# Patient Record
Sex: Male | Born: 1940 | Race: White | Hispanic: No | Marital: Married | Smoking: Never smoker
Health system: Southern US, Community
[De-identification: ages and names within clinical notes are randomized; demographics above are authoritative.]

## PROBLEM LIST (undated history)

## (undated) DIAGNOSIS — N189 Chronic kidney disease, unspecified: Secondary | ICD-10-CM

## (undated) DIAGNOSIS — C4491 Basal cell carcinoma of skin, unspecified: Secondary | ICD-10-CM

## (undated) DIAGNOSIS — K4090 Unilateral inguinal hernia, without obstruction or gangrene, not specified as recurrent: Secondary | ICD-10-CM

## (undated) DIAGNOSIS — H269 Unspecified cataract: Secondary | ICD-10-CM

## (undated) DIAGNOSIS — I209 Angina pectoris, unspecified: Secondary | ICD-10-CM

## (undated) DIAGNOSIS — E119 Type 2 diabetes mellitus without complications: Secondary | ICD-10-CM

## (undated) DIAGNOSIS — Z7901 Long term (current) use of anticoagulants: Secondary | ICD-10-CM

## (undated) DIAGNOSIS — I219 Acute myocardial infarction, unspecified: Secondary | ICD-10-CM

## (undated) DIAGNOSIS — I700 Atherosclerosis of aorta: Secondary | ICD-10-CM

## (undated) DIAGNOSIS — E785 Hyperlipidemia, unspecified: Secondary | ICD-10-CM

## (undated) DIAGNOSIS — K5790 Diverticulosis of intestine, part unspecified, without perforation or abscess without bleeding: Secondary | ICD-10-CM

## (undated) DIAGNOSIS — K219 Gastro-esophageal reflux disease without esophagitis: Secondary | ICD-10-CM

## (undated) HISTORY — PX: CATARACT EXTRACTION W/ INTRAOCULAR LENS IMPLANT: SHX1309

## (undated) HISTORY — PX: TONSILLECTOMY AND ADENOIDECTOMY: SUR1326

## (undated) HISTORY — PX: CORONARY ANGIOPLASTY WITH STENT PLACEMENT: SHX49

## (undated) HISTORY — PX: SINUSOTOMY: SHX291

## (undated) HISTORY — PX: CARDIAC CATHETERIZATION: SHX172

---

## 2000-03-03 HISTORY — PX: ESOPHAGOGASTRODUODENOSCOPY: SHX1529

## 2001-01-31 HISTORY — PX: COLONOSCOPY: SHX174

## 2008-04-15 ENCOUNTER — Ambulatory Visit: Payer: Self-pay

## 2008-09-29 ENCOUNTER — Inpatient Hospital Stay: Payer: Self-pay | Admitting: Internal Medicine

## 2008-10-31 ENCOUNTER — Ambulatory Visit: Payer: Self-pay | Admitting: Internal Medicine

## 2008-11-02 ENCOUNTER — Ambulatory Visit: Payer: Self-pay | Admitting: Internal Medicine

## 2008-11-19 ENCOUNTER — Ambulatory Visit: Payer: Self-pay | Admitting: Unknown Physician Specialty

## 2008-11-19 HISTORY — PX: COLONOSCOPY: SHX174

## 2012-07-05 DIAGNOSIS — I219 Acute myocardial infarction, unspecified: Secondary | ICD-10-CM

## 2012-07-05 HISTORY — DX: Acute myocardial infarction, unspecified: I21.9

## 2013-05-12 ENCOUNTER — Emergency Department: Payer: Self-pay | Admitting: Emergency Medicine

## 2013-05-12 DIAGNOSIS — K219 Gastro-esophageal reflux disease without esophagitis: Secondary | ICD-10-CM | POA: Insufficient documentation

## 2013-05-12 DIAGNOSIS — I2510 Atherosclerotic heart disease of native coronary artery without angina pectoris: Secondary | ICD-10-CM

## 2013-05-12 DIAGNOSIS — I2119 ST elevation (STEMI) myocardial infarction involving other coronary artery of inferior wall: Secondary | ICD-10-CM

## 2013-05-12 HISTORY — DX: Atherosclerotic heart disease of native coronary artery without angina pectoris: I25.10

## 2013-05-12 HISTORY — PX: CORONARY ANGIOPLASTY WITH STENT PLACEMENT: SHX49

## 2013-05-12 HISTORY — DX: ST elevation (STEMI) myocardial infarction involving other coronary artery of inferior wall: I21.19

## 2013-05-12 LAB — COMPREHENSIVE METABOLIC PANEL
Albumin: 4 g/dL (ref 3.4–5.0)
Alkaline Phosphatase: 79 U/L (ref 50–136)
Anion Gap: 8 (ref 7–16)
BUN: 18 mg/dL (ref 7–18)
Calcium, Total: 8.8 mg/dL (ref 8.5–10.1)
Chloride: 105 mmol/L (ref 98–107)
Co2: 25 mmol/L (ref 21–32)
Creatinine: 1.08 mg/dL (ref 0.60–1.30)
EGFR (Non-African Amer.): >60
Osmolality: 283 (ref 275–301)

## 2013-05-12 LAB — CBC
HGB: 14.5 g/dL (ref 13.0–18.0)
MCH: 28.3 pg (ref 26.0–34.0)
MCHC: 33.6 g/dL (ref 32.0–36.0)
MCV: 84 fL (ref 80–100)
Platelet: 173 10*3/uL (ref 150–440)
RBC: 5.11 10*6/uL (ref 4.40–5.90)
RDW: 15.1 % — ABNORMAL HIGH (ref 11.5–14.5)

## 2013-05-12 LAB — TROPONIN I: Troponin-I: 0.04 ng/mL

## 2013-05-12 LAB — CK TOTAL AND CKMB (NOT AT ARMC): CK-MB: 2.4 ng/mL (ref 0.5–3.6)

## 2013-05-13 DIAGNOSIS — I4729 Other ventricular tachycardia: Secondary | ICD-10-CM

## 2013-05-13 HISTORY — DX: Other ventricular tachycardia: I47.29

## 2013-05-22 ENCOUNTER — Encounter: Payer: Self-pay | Admitting: Internal Medicine

## 2013-06-04 ENCOUNTER — Encounter: Payer: Self-pay | Admitting: Internal Medicine

## 2015-01-27 HISTORY — PX: MOHS SURGERY: SUR867

## 2015-06-20 ENCOUNTER — Other Ambulatory Visit: Payer: Self-pay | Admitting: Internal Medicine

## 2015-06-20 ENCOUNTER — Ambulatory Visit
Admission: RE | Admit: 2015-06-20 | Discharge: 2015-06-20 | Disposition: A | Discharge status: Home / Self Care | Payer: PPO | Source: Ambulatory Visit | Attending: Internal Medicine | Admitting: Internal Medicine

## 2015-06-20 DIAGNOSIS — R1031 Right lower quadrant pain: Secondary | ICD-10-CM

## 2015-06-20 DIAGNOSIS — K4020 Bilateral inguinal hernia, without obstruction or gangrene, not specified as recurrent: Secondary | ICD-10-CM | POA: Diagnosis not present

## 2015-06-20 DIAGNOSIS — K5730 Diverticulosis of large intestine without perforation or abscess without bleeding: Secondary | ICD-10-CM | POA: Diagnosis not present

## 2015-06-20 DIAGNOSIS — K353 Acute appendicitis with localized peritonitis, without perforation or gangrene: Secondary | ICD-10-CM

## 2015-06-20 MED ORDER — IOHEXOL 300 MG/ML  SOLN
100.0000 mL | Freq: Once | INTRAMUSCULAR | Status: AC | PRN
  Administered 2015-06-20: 100 mL via INTRAVENOUS

## 2015-07-01 ENCOUNTER — Ambulatory Visit: Payer: Self-pay

## 2015-08-14 DIAGNOSIS — X32XXXA Exposure to sunlight, initial encounter: Secondary | ICD-10-CM | POA: Diagnosis not present

## 2015-08-14 DIAGNOSIS — L570 Actinic keratosis: Secondary | ICD-10-CM | POA: Diagnosis not present

## 2015-08-14 DIAGNOSIS — D485 Neoplasm of uncertain behavior of skin: Secondary | ICD-10-CM | POA: Diagnosis not present

## 2015-08-14 DIAGNOSIS — D0471 Carcinoma in situ of skin of right lower limb, including hip: Secondary | ICD-10-CM | POA: Diagnosis not present

## 2015-08-27 DIAGNOSIS — Z961 Presence of intraocular lens: Secondary | ICD-10-CM | POA: Diagnosis not present

## 2015-09-11 DIAGNOSIS — D0471 Carcinoma in situ of skin of right lower limb, including hip: Secondary | ICD-10-CM | POA: Diagnosis not present

## 2015-10-21 DIAGNOSIS — Z125 Encounter for screening for malignant neoplasm of prostate: Secondary | ICD-10-CM | POA: Diagnosis not present

## 2015-10-21 DIAGNOSIS — E119 Type 2 diabetes mellitus without complications: Secondary | ICD-10-CM | POA: Diagnosis not present

## 2015-10-28 DIAGNOSIS — L570 Actinic keratosis: Secondary | ICD-10-CM | POA: Diagnosis not present

## 2015-10-28 DIAGNOSIS — E782 Mixed hyperlipidemia: Secondary | ICD-10-CM | POA: Insufficient documentation

## 2015-10-28 DIAGNOSIS — Z0000 Encounter for general adult medical examination without abnormal findings: Secondary | ICD-10-CM | POA: Diagnosis not present

## 2015-10-28 DIAGNOSIS — E119 Type 2 diabetes mellitus without complications: Secondary | ICD-10-CM | POA: Diagnosis not present

## 2015-12-11 DIAGNOSIS — D485 Neoplasm of uncertain behavior of skin: Secondary | ICD-10-CM | POA: Diagnosis not present

## 2015-12-11 DIAGNOSIS — L570 Actinic keratosis: Secondary | ICD-10-CM | POA: Diagnosis not present

## 2015-12-11 DIAGNOSIS — C44619 Basal cell carcinoma of skin of left upper limb, including shoulder: Secondary | ICD-10-CM | POA: Diagnosis not present

## 2015-12-11 DIAGNOSIS — X32XXXA Exposure to sunlight, initial encounter: Secondary | ICD-10-CM | POA: Diagnosis not present

## 2015-12-11 DIAGNOSIS — C44719 Basal cell carcinoma of skin of left lower limb, including hip: Secondary | ICD-10-CM | POA: Diagnosis not present

## 2015-12-11 DIAGNOSIS — Z85828 Personal history of other malignant neoplasm of skin: Secondary | ICD-10-CM | POA: Diagnosis not present

## 2015-12-17 DIAGNOSIS — L905 Scar conditions and fibrosis of skin: Secondary | ICD-10-CM | POA: Diagnosis not present

## 2015-12-17 DIAGNOSIS — C44619 Basal cell carcinoma of skin of left upper limb, including shoulder: Secondary | ICD-10-CM | POA: Diagnosis not present

## 2016-01-08 DIAGNOSIS — C44719 Basal cell carcinoma of skin of left lower limb, including hip: Secondary | ICD-10-CM | POA: Diagnosis not present

## 2016-02-10 DIAGNOSIS — H169 Unspecified keratitis: Secondary | ICD-10-CM | POA: Diagnosis not present

## 2016-02-12 DIAGNOSIS — H169 Unspecified keratitis: Secondary | ICD-10-CM | POA: Diagnosis not present

## 2016-02-16 DIAGNOSIS — E119 Type 2 diabetes mellitus without complications: Secondary | ICD-10-CM | POA: Diagnosis not present

## 2016-03-01 DIAGNOSIS — H169 Unspecified keratitis: Secondary | ICD-10-CM | POA: Diagnosis not present

## 2016-03-05 DIAGNOSIS — R197 Diarrhea, unspecified: Secondary | ICD-10-CM | POA: Diagnosis not present

## 2016-03-09 DIAGNOSIS — H1859 Other hereditary corneal dystrophies: Secondary | ICD-10-CM | POA: Diagnosis not present

## 2016-04-08 DIAGNOSIS — H1859 Other hereditary corneal dystrophies: Secondary | ICD-10-CM | POA: Diagnosis not present

## 2016-04-21 DIAGNOSIS — E119 Type 2 diabetes mellitus without complications: Secondary | ICD-10-CM | POA: Diagnosis not present

## 2016-04-21 DIAGNOSIS — Z0000 Encounter for general adult medical examination without abnormal findings: Secondary | ICD-10-CM | POA: Diagnosis not present

## 2016-04-28 DIAGNOSIS — Z23 Encounter for immunization: Secondary | ICD-10-CM | POA: Diagnosis not present

## 2016-04-28 DIAGNOSIS — E119 Type 2 diabetes mellitus without complications: Secondary | ICD-10-CM | POA: Diagnosis not present

## 2016-04-28 DIAGNOSIS — E782 Mixed hyperlipidemia: Secondary | ICD-10-CM | POA: Diagnosis not present

## 2016-05-12 DIAGNOSIS — D0462 Carcinoma in situ of skin of left upper limb, including shoulder: Secondary | ICD-10-CM | POA: Diagnosis not present

## 2016-05-12 DIAGNOSIS — D485 Neoplasm of uncertain behavior of skin: Secondary | ICD-10-CM | POA: Diagnosis not present

## 2016-05-12 DIAGNOSIS — C44612 Basal cell carcinoma of skin of right upper limb, including shoulder: Secondary | ICD-10-CM | POA: Diagnosis not present

## 2016-05-12 DIAGNOSIS — L400 Psoriasis vulgaris: Secondary | ICD-10-CM | POA: Diagnosis not present

## 2016-05-25 DIAGNOSIS — H6063 Unspecified chronic otitis externa, bilateral: Secondary | ICD-10-CM | POA: Diagnosis not present

## 2016-05-25 DIAGNOSIS — H6123 Impacted cerumen, bilateral: Secondary | ICD-10-CM | POA: Diagnosis not present

## 2016-06-03 DIAGNOSIS — D0462 Carcinoma in situ of skin of left upper limb, including shoulder: Secondary | ICD-10-CM | POA: Diagnosis not present

## 2016-06-03 DIAGNOSIS — C44519 Basal cell carcinoma of skin of other part of trunk: Secondary | ICD-10-CM | POA: Diagnosis not present

## 2016-08-02 DIAGNOSIS — J4522 Mild intermittent asthma with status asthmaticus: Secondary | ICD-10-CM | POA: Diagnosis not present

## 2016-08-02 DIAGNOSIS — J40 Bronchitis, not specified as acute or chronic: Secondary | ICD-10-CM | POA: Diagnosis not present

## 2016-09-01 DIAGNOSIS — G5702 Lesion of sciatic nerve, left lower limb: Secondary | ICD-10-CM | POA: Diagnosis not present

## 2016-09-09 DIAGNOSIS — M461 Sacroiliitis, not elsewhere classified: Secondary | ICD-10-CM | POA: Diagnosis not present

## 2016-09-09 DIAGNOSIS — M9903 Segmental and somatic dysfunction of lumbar region: Secondary | ICD-10-CM | POA: Diagnosis not present

## 2016-09-09 DIAGNOSIS — M9904 Segmental and somatic dysfunction of sacral region: Secondary | ICD-10-CM | POA: Diagnosis not present

## 2016-09-09 DIAGNOSIS — M545 Low back pain: Secondary | ICD-10-CM | POA: Diagnosis not present

## 2016-09-13 DIAGNOSIS — M461 Sacroiliitis, not elsewhere classified: Secondary | ICD-10-CM | POA: Diagnosis not present

## 2016-09-13 DIAGNOSIS — M545 Low back pain: Secondary | ICD-10-CM | POA: Diagnosis not present

## 2016-09-13 DIAGNOSIS — M9903 Segmental and somatic dysfunction of lumbar region: Secondary | ICD-10-CM | POA: Diagnosis not present

## 2016-09-13 DIAGNOSIS — M9904 Segmental and somatic dysfunction of sacral region: Secondary | ICD-10-CM | POA: Diagnosis not present

## 2016-09-27 DIAGNOSIS — M9904 Segmental and somatic dysfunction of sacral region: Secondary | ICD-10-CM | POA: Diagnosis not present

## 2016-09-27 DIAGNOSIS — M545 Low back pain: Secondary | ICD-10-CM | POA: Diagnosis not present

## 2016-09-27 DIAGNOSIS — M461 Sacroiliitis, not elsewhere classified: Secondary | ICD-10-CM | POA: Diagnosis not present

## 2016-09-27 DIAGNOSIS — M9903 Segmental and somatic dysfunction of lumbar region: Secondary | ICD-10-CM | POA: Diagnosis not present

## 2016-10-07 DIAGNOSIS — D485 Neoplasm of uncertain behavior of skin: Secondary | ICD-10-CM | POA: Diagnosis not present

## 2016-10-07 DIAGNOSIS — L821 Other seborrheic keratosis: Secondary | ICD-10-CM | POA: Diagnosis not present

## 2016-10-07 DIAGNOSIS — Z08 Encounter for follow-up examination after completed treatment for malignant neoplasm: Secondary | ICD-10-CM | POA: Diagnosis not present

## 2016-10-07 DIAGNOSIS — D0461 Carcinoma in situ of skin of right upper limb, including shoulder: Secondary | ICD-10-CM | POA: Diagnosis not present

## 2016-10-07 DIAGNOSIS — Z85828 Personal history of other malignant neoplasm of skin: Secondary | ICD-10-CM | POA: Diagnosis not present

## 2016-10-07 DIAGNOSIS — L570 Actinic keratosis: Secondary | ICD-10-CM | POA: Diagnosis not present

## 2016-10-21 DIAGNOSIS — Z125 Encounter for screening for malignant neoplasm of prostate: Secondary | ICD-10-CM | POA: Diagnosis not present

## 2016-10-21 DIAGNOSIS — E119 Type 2 diabetes mellitus without complications: Secondary | ICD-10-CM | POA: Diagnosis not present

## 2016-10-21 DIAGNOSIS — E782 Mixed hyperlipidemia: Secondary | ICD-10-CM | POA: Diagnosis not present

## 2016-10-26 DIAGNOSIS — M9903 Segmental and somatic dysfunction of lumbar region: Secondary | ICD-10-CM | POA: Diagnosis not present

## 2016-10-26 DIAGNOSIS — M9904 Segmental and somatic dysfunction of sacral region: Secondary | ICD-10-CM | POA: Diagnosis not present

## 2016-10-26 DIAGNOSIS — M545 Low back pain: Secondary | ICD-10-CM | POA: Diagnosis not present

## 2016-10-26 DIAGNOSIS — M461 Sacroiliitis, not elsewhere classified: Secondary | ICD-10-CM | POA: Diagnosis not present

## 2016-10-29 DIAGNOSIS — Z0000 Encounter for general adult medical examination without abnormal findings: Secondary | ICD-10-CM | POA: Diagnosis not present

## 2016-10-29 DIAGNOSIS — E119 Type 2 diabetes mellitus without complications: Secondary | ICD-10-CM | POA: Diagnosis not present

## 2016-11-19 DIAGNOSIS — I2510 Atherosclerotic heart disease of native coronary artery without angina pectoris: Secondary | ICD-10-CM | POA: Diagnosis not present

## 2016-11-19 DIAGNOSIS — R0789 Other chest pain: Secondary | ICD-10-CM | POA: Diagnosis not present

## 2016-11-19 DIAGNOSIS — E782 Mixed hyperlipidemia: Secondary | ICD-10-CM | POA: Diagnosis not present

## 2016-11-19 DIAGNOSIS — K219 Gastro-esophageal reflux disease without esophagitis: Secondary | ICD-10-CM | POA: Diagnosis not present

## 2016-11-19 DIAGNOSIS — E119 Type 2 diabetes mellitus without complications: Secondary | ICD-10-CM | POA: Diagnosis not present

## 2016-11-25 DIAGNOSIS — D0461 Carcinoma in situ of skin of right upper limb, including shoulder: Secondary | ICD-10-CM | POA: Diagnosis not present

## 2016-11-25 DIAGNOSIS — L570 Actinic keratosis: Secondary | ICD-10-CM | POA: Diagnosis not present

## 2016-12-03 DIAGNOSIS — I2510 Atherosclerotic heart disease of native coronary artery without angina pectoris: Secondary | ICD-10-CM | POA: Diagnosis not present

## 2016-12-03 DIAGNOSIS — R0789 Other chest pain: Secondary | ICD-10-CM | POA: Diagnosis not present

## 2016-12-10 DIAGNOSIS — E119 Type 2 diabetes mellitus without complications: Secondary | ICD-10-CM | POA: Diagnosis not present

## 2016-12-10 DIAGNOSIS — I2510 Atherosclerotic heart disease of native coronary artery without angina pectoris: Secondary | ICD-10-CM | POA: Diagnosis not present

## 2016-12-10 DIAGNOSIS — E782 Mixed hyperlipidemia: Secondary | ICD-10-CM | POA: Diagnosis not present

## 2017-01-18 DIAGNOSIS — H1859 Other hereditary corneal dystrophies: Secondary | ICD-10-CM | POA: Diagnosis not present

## 2017-01-27 DIAGNOSIS — L570 Actinic keratosis: Secondary | ICD-10-CM | POA: Diagnosis not present

## 2017-01-27 DIAGNOSIS — X32XXXA Exposure to sunlight, initial encounter: Secondary | ICD-10-CM | POA: Diagnosis not present

## 2017-04-01 DIAGNOSIS — X32XXXA Exposure to sunlight, initial encounter: Secondary | ICD-10-CM | POA: Diagnosis not present

## 2017-04-01 DIAGNOSIS — L570 Actinic keratosis: Secondary | ICD-10-CM | POA: Diagnosis not present

## 2017-04-25 DIAGNOSIS — E119 Type 2 diabetes mellitus without complications: Secondary | ICD-10-CM | POA: Diagnosis not present

## 2017-05-02 DIAGNOSIS — E119 Type 2 diabetes mellitus without complications: Secondary | ICD-10-CM | POA: Diagnosis not present

## 2017-05-02 DIAGNOSIS — Z23 Encounter for immunization: Secondary | ICD-10-CM | POA: Diagnosis not present

## 2017-05-02 DIAGNOSIS — Z79899 Other long term (current) drug therapy: Secondary | ICD-10-CM | POA: Diagnosis not present

## 2017-05-02 DIAGNOSIS — Z125 Encounter for screening for malignant neoplasm of prostate: Secondary | ICD-10-CM | POA: Diagnosis not present

## 2017-05-02 DIAGNOSIS — D500 Iron deficiency anemia secondary to blood loss (chronic): Secondary | ICD-10-CM | POA: Diagnosis not present

## 2017-05-02 DIAGNOSIS — E538 Deficiency of other specified B group vitamins: Secondary | ICD-10-CM | POA: Diagnosis not present

## 2017-05-02 DIAGNOSIS — E782 Mixed hyperlipidemia: Secondary | ICD-10-CM | POA: Diagnosis not present

## 2017-05-13 DIAGNOSIS — D500 Iron deficiency anemia secondary to blood loss (chronic): Secondary | ICD-10-CM | POA: Diagnosis not present

## 2017-05-18 DIAGNOSIS — E119 Type 2 diabetes mellitus without complications: Secondary | ICD-10-CM | POA: Diagnosis not present

## 2017-05-18 DIAGNOSIS — J40 Bronchitis, not specified as acute or chronic: Secondary | ICD-10-CM | POA: Diagnosis not present

## 2017-06-02 DIAGNOSIS — D500 Iron deficiency anemia secondary to blood loss (chronic): Secondary | ICD-10-CM | POA: Diagnosis not present

## 2017-06-22 DIAGNOSIS — L218 Other seborrheic dermatitis: Secondary | ICD-10-CM | POA: Diagnosis not present

## 2017-06-22 DIAGNOSIS — C44612 Basal cell carcinoma of skin of right upper limb, including shoulder: Secondary | ICD-10-CM | POA: Diagnosis not present

## 2017-06-22 DIAGNOSIS — D485 Neoplasm of uncertain behavior of skin: Secondary | ICD-10-CM | POA: Diagnosis not present

## 2017-07-28 DIAGNOSIS — H6061 Unspecified chronic otitis externa, right ear: Secondary | ICD-10-CM | POA: Diagnosis not present

## 2017-07-28 DIAGNOSIS — H60541 Acute eczematoid otitis externa, right ear: Secondary | ICD-10-CM | POA: Diagnosis not present

## 2017-08-05 DIAGNOSIS — D485 Neoplasm of uncertain behavior of skin: Secondary | ICD-10-CM | POA: Diagnosis not present

## 2017-08-05 DIAGNOSIS — C44612 Basal cell carcinoma of skin of right upper limb, including shoulder: Secondary | ICD-10-CM | POA: Diagnosis not present

## 2017-08-05 DIAGNOSIS — C44329 Squamous cell carcinoma of skin of other parts of face: Secondary | ICD-10-CM | POA: Diagnosis not present

## 2017-08-05 DIAGNOSIS — C4441 Basal cell carcinoma of skin of scalp and neck: Secondary | ICD-10-CM | POA: Diagnosis not present

## 2017-08-19 DIAGNOSIS — C44612 Basal cell carcinoma of skin of right upper limb, including shoulder: Secondary | ICD-10-CM | POA: Diagnosis not present

## 2017-09-15 DIAGNOSIS — L905 Scar conditions and fibrosis of skin: Secondary | ICD-10-CM | POA: Diagnosis not present

## 2017-09-15 DIAGNOSIS — C44319 Basal cell carcinoma of skin of other parts of face: Secondary | ICD-10-CM | POA: Diagnosis not present

## 2017-09-15 DIAGNOSIS — C44329 Squamous cell carcinoma of skin of other parts of face: Secondary | ICD-10-CM | POA: Diagnosis not present

## 2017-09-29 DIAGNOSIS — L538 Other specified erythematous conditions: Secondary | ICD-10-CM | POA: Diagnosis not present

## 2017-09-29 DIAGNOSIS — L239 Allergic contact dermatitis, unspecified cause: Secondary | ICD-10-CM | POA: Diagnosis not present

## 2017-09-29 DIAGNOSIS — L820 Inflamed seborrheic keratosis: Secondary | ICD-10-CM | POA: Diagnosis not present

## 2017-09-29 DIAGNOSIS — L308 Other specified dermatitis: Secondary | ICD-10-CM | POA: Diagnosis not present

## 2017-09-29 DIAGNOSIS — L821 Other seborrheic keratosis: Secondary | ICD-10-CM | POA: Diagnosis not present

## 2017-10-27 DIAGNOSIS — E782 Mixed hyperlipidemia: Secondary | ICD-10-CM | POA: Diagnosis not present

## 2017-10-27 DIAGNOSIS — Z125 Encounter for screening for malignant neoplasm of prostate: Secondary | ICD-10-CM | POA: Diagnosis not present

## 2017-10-27 DIAGNOSIS — E119 Type 2 diabetes mellitus without complications: Secondary | ICD-10-CM | POA: Diagnosis not present

## 2017-10-27 DIAGNOSIS — E538 Deficiency of other specified B group vitamins: Secondary | ICD-10-CM | POA: Diagnosis not present

## 2017-10-27 DIAGNOSIS — M545 Low back pain: Secondary | ICD-10-CM | POA: Diagnosis not present

## 2017-10-27 DIAGNOSIS — M9903 Segmental and somatic dysfunction of lumbar region: Secondary | ICD-10-CM | POA: Diagnosis not present

## 2017-10-31 DIAGNOSIS — M9903 Segmental and somatic dysfunction of lumbar region: Secondary | ICD-10-CM | POA: Diagnosis not present

## 2017-10-31 DIAGNOSIS — M545 Low back pain: Secondary | ICD-10-CM | POA: Diagnosis not present

## 2017-11-03 DIAGNOSIS — E119 Type 2 diabetes mellitus without complications: Secondary | ICD-10-CM | POA: Diagnosis not present

## 2017-11-03 DIAGNOSIS — E782 Mixed hyperlipidemia: Secondary | ICD-10-CM | POA: Diagnosis not present

## 2017-11-03 DIAGNOSIS — Z0000 Encounter for general adult medical examination without abnormal findings: Secondary | ICD-10-CM | POA: Diagnosis not present

## 2017-12-01 DIAGNOSIS — G8929 Other chronic pain: Secondary | ICD-10-CM | POA: Diagnosis not present

## 2017-12-01 DIAGNOSIS — M545 Low back pain: Secondary | ICD-10-CM | POA: Diagnosis not present

## 2017-12-01 DIAGNOSIS — E782 Mixed hyperlipidemia: Secondary | ICD-10-CM | POA: Diagnosis not present

## 2017-12-01 DIAGNOSIS — I2510 Atherosclerotic heart disease of native coronary artery without angina pectoris: Secondary | ICD-10-CM | POA: Diagnosis not present

## 2017-12-01 DIAGNOSIS — E119 Type 2 diabetes mellitus without complications: Secondary | ICD-10-CM | POA: Diagnosis not present

## 2017-12-01 DIAGNOSIS — I208 Other forms of angina pectoris: Secondary | ICD-10-CM | POA: Diagnosis not present

## 2017-12-01 DIAGNOSIS — R0609 Other forms of dyspnea: Secondary | ICD-10-CM | POA: Diagnosis not present

## 2017-12-01 DIAGNOSIS — J40 Bronchitis, not specified as acute or chronic: Secondary | ICD-10-CM | POA: Diagnosis not present

## 2017-12-08 ENCOUNTER — Other Ambulatory Visit: Payer: Self-pay

## 2017-12-08 DIAGNOSIS — I25118 Atherosclerotic heart disease of native coronary artery with other forms of angina pectoris: Secondary | ICD-10-CM

## 2017-12-08 NOTE — Progress Notes (Signed)
Dr. Ubaldo Glassing patient needing a cardiac CT for Dr. Johnsie Cancel to read.

## 2017-12-11 DIAGNOSIS — R5381 Other malaise: Secondary | ICD-10-CM | POA: Diagnosis not present

## 2017-12-11 DIAGNOSIS — R5383 Other fatigue: Secondary | ICD-10-CM | POA: Diagnosis not present

## 2017-12-11 DIAGNOSIS — R0602 Shortness of breath: Secondary | ICD-10-CM | POA: Diagnosis not present

## 2017-12-16 DIAGNOSIS — Z85828 Personal history of other malignant neoplasm of skin: Secondary | ICD-10-CM | POA: Diagnosis not present

## 2017-12-16 DIAGNOSIS — X32XXXA Exposure to sunlight, initial encounter: Secondary | ICD-10-CM | POA: Diagnosis not present

## 2017-12-16 DIAGNOSIS — Z08 Encounter for follow-up examination after completed treatment for malignant neoplasm: Secondary | ICD-10-CM | POA: Diagnosis not present

## 2017-12-16 DIAGNOSIS — C4441 Basal cell carcinoma of skin of scalp and neck: Secondary | ICD-10-CM | POA: Diagnosis not present

## 2017-12-16 DIAGNOSIS — L570 Actinic keratosis: Secondary | ICD-10-CM | POA: Diagnosis not present

## 2017-12-16 DIAGNOSIS — D485 Neoplasm of uncertain behavior of skin: Secondary | ICD-10-CM | POA: Diagnosis not present

## 2017-12-19 DIAGNOSIS — I2510 Atherosclerotic heart disease of native coronary artery without angina pectoris: Secondary | ICD-10-CM | POA: Diagnosis not present

## 2017-12-27 ENCOUNTER — Ambulatory Visit (HOSPITAL_COMMUNITY)
Admission: RE | Admit: 2017-12-27 | Discharge: 2017-12-27 | Disposition: A | Discharge status: Home / Self Care | Payer: PPO | Source: Ambulatory Visit | Attending: Cardiovascular Disease | Admitting: Cardiovascular Disease

## 2017-12-27 DIAGNOSIS — I25118 Atherosclerotic heart disease of native coronary artery with other forms of angina pectoris: Secondary | ICD-10-CM

## 2017-12-27 DIAGNOSIS — I77810 Thoracic aortic ectasia: Secondary | ICD-10-CM | POA: Diagnosis not present

## 2017-12-27 DIAGNOSIS — R079 Chest pain, unspecified: Secondary | ICD-10-CM | POA: Diagnosis not present

## 2017-12-27 DIAGNOSIS — I7789 Other specified disorders of arteries and arterioles: Secondary | ICD-10-CM

## 2017-12-27 HISTORY — DX: Other specified disorders of arteries and arterioles: I77.89

## 2017-12-27 MED ORDER — IOPAMIDOL (ISOVUE-370) INJECTION 76%
INTRAVENOUS | Status: AC
  Filled 2017-12-27: qty 100

## 2017-12-27 MED ORDER — NITROGLYCERIN 0.4 MG SL SUBL
SUBLINGUAL_TABLET | SUBLINGUAL | Status: AC
  Filled 2017-12-27: qty 1

## 2017-12-27 MED ORDER — METOPROLOL TARTRATE 5 MG/5ML IV SOLN
INTRAVENOUS | Status: AC
  Filled 2017-12-27: qty 10

## 2017-12-27 MED ORDER — METOPROLOL TARTRATE 5 MG/5ML IV SOLN
5.0000 mg | INTRAVENOUS | Status: AC | PRN
  Administered 2017-12-27 (×2): 5 mg via INTRAVENOUS

## 2017-12-27 MED ORDER — IOPAMIDOL (ISOVUE-370) INJECTION 76%
100.0000 mL | Freq: Once | INTRAVENOUS | Status: AC | PRN
  Administered 2017-12-27: 80 mL via INTRAVENOUS

## 2017-12-27 MED ORDER — NITROGLYCERIN 0.4 MG SL SUBL
0.8000 mg | SUBLINGUAL_TABLET | Freq: Once | SUBLINGUAL | Status: AC
  Administered 2017-12-27: 0.8 mg via SUBLINGUAL
  Filled 2017-12-27: qty 25

## 2018-01-18 DIAGNOSIS — C4441 Basal cell carcinoma of skin of scalp and neck: Secondary | ICD-10-CM | POA: Diagnosis not present

## 2018-03-02 DIAGNOSIS — H1859 Other hereditary corneal dystrophies: Secondary | ICD-10-CM | POA: Diagnosis not present

## 2018-03-08 DIAGNOSIS — L820 Inflamed seborrheic keratosis: Secondary | ICD-10-CM | POA: Diagnosis not present

## 2018-03-08 DIAGNOSIS — L298 Other pruritus: Secondary | ICD-10-CM | POA: Diagnosis not present

## 2018-03-08 DIAGNOSIS — L538 Other specified erythematous conditions: Secondary | ICD-10-CM | POA: Diagnosis not present

## 2018-05-02 DIAGNOSIS — E782 Mixed hyperlipidemia: Secondary | ICD-10-CM | POA: Diagnosis not present

## 2018-05-02 DIAGNOSIS — E119 Type 2 diabetes mellitus without complications: Secondary | ICD-10-CM | POA: Diagnosis not present

## 2018-05-15 DIAGNOSIS — E1151 Type 2 diabetes mellitus with diabetic peripheral angiopathy without gangrene: Secondary | ICD-10-CM | POA: Diagnosis not present

## 2018-05-15 DIAGNOSIS — E782 Mixed hyperlipidemia: Secondary | ICD-10-CM | POA: Diagnosis not present

## 2018-05-15 DIAGNOSIS — E119 Type 2 diabetes mellitus without complications: Secondary | ICD-10-CM | POA: Insufficient documentation

## 2018-05-15 DIAGNOSIS — Z125 Encounter for screening for malignant neoplasm of prostate: Secondary | ICD-10-CM | POA: Diagnosis not present

## 2018-05-18 DIAGNOSIS — K2990 Gastroduodenitis, unspecified, without bleeding: Secondary | ICD-10-CM | POA: Diagnosis not present

## 2018-06-08 DIAGNOSIS — L570 Actinic keratosis: Secondary | ICD-10-CM | POA: Diagnosis not present

## 2018-06-08 DIAGNOSIS — D0439 Carcinoma in situ of skin of other parts of face: Secondary | ICD-10-CM | POA: Diagnosis not present

## 2018-06-08 DIAGNOSIS — Z85828 Personal history of other malignant neoplasm of skin: Secondary | ICD-10-CM | POA: Diagnosis not present

## 2018-06-08 DIAGNOSIS — D485 Neoplasm of uncertain behavior of skin: Secondary | ICD-10-CM | POA: Diagnosis not present

## 2018-06-08 DIAGNOSIS — X32XXXA Exposure to sunlight, initial encounter: Secondary | ICD-10-CM | POA: Diagnosis not present

## 2018-06-08 DIAGNOSIS — Z08 Encounter for follow-up examination after completed treatment for malignant neoplasm: Secondary | ICD-10-CM | POA: Diagnosis not present

## 2018-06-13 DIAGNOSIS — M9902 Segmental and somatic dysfunction of thoracic region: Secondary | ICD-10-CM | POA: Diagnosis not present

## 2018-06-13 DIAGNOSIS — M5136 Other intervertebral disc degeneration, lumbar region: Secondary | ICD-10-CM | POA: Diagnosis not present

## 2018-06-13 DIAGNOSIS — M9903 Segmental and somatic dysfunction of lumbar region: Secondary | ICD-10-CM | POA: Diagnosis not present

## 2018-06-13 DIAGNOSIS — M5416 Radiculopathy, lumbar region: Secondary | ICD-10-CM | POA: Diagnosis not present

## 2018-06-14 DIAGNOSIS — M5136 Other intervertebral disc degeneration, lumbar region: Secondary | ICD-10-CM | POA: Diagnosis not present

## 2018-06-14 DIAGNOSIS — D0439 Carcinoma in situ of skin of other parts of face: Secondary | ICD-10-CM | POA: Diagnosis not present

## 2018-06-14 DIAGNOSIS — M5416 Radiculopathy, lumbar region: Secondary | ICD-10-CM | POA: Diagnosis not present

## 2018-06-14 DIAGNOSIS — C44329 Squamous cell carcinoma of skin of other parts of face: Secondary | ICD-10-CM | POA: Diagnosis not present

## 2018-06-14 DIAGNOSIS — M9902 Segmental and somatic dysfunction of thoracic region: Secondary | ICD-10-CM | POA: Diagnosis not present

## 2018-06-14 DIAGNOSIS — M9903 Segmental and somatic dysfunction of lumbar region: Secondary | ICD-10-CM | POA: Diagnosis not present

## 2018-06-15 DIAGNOSIS — M5136 Other intervertebral disc degeneration, lumbar region: Secondary | ICD-10-CM | POA: Diagnosis not present

## 2018-06-15 DIAGNOSIS — M9903 Segmental and somatic dysfunction of lumbar region: Secondary | ICD-10-CM | POA: Diagnosis not present

## 2018-06-15 DIAGNOSIS — M5416 Radiculopathy, lumbar region: Secondary | ICD-10-CM | POA: Diagnosis not present

## 2018-06-15 DIAGNOSIS — M9902 Segmental and somatic dysfunction of thoracic region: Secondary | ICD-10-CM | POA: Diagnosis not present

## 2018-06-19 DIAGNOSIS — M9903 Segmental and somatic dysfunction of lumbar region: Secondary | ICD-10-CM | POA: Diagnosis not present

## 2018-06-19 DIAGNOSIS — M9902 Segmental and somatic dysfunction of thoracic region: Secondary | ICD-10-CM | POA: Diagnosis not present

## 2018-06-19 DIAGNOSIS — M5136 Other intervertebral disc degeneration, lumbar region: Secondary | ICD-10-CM | POA: Diagnosis not present

## 2018-06-19 DIAGNOSIS — M5416 Radiculopathy, lumbar region: Secondary | ICD-10-CM | POA: Diagnosis not present

## 2018-06-20 DIAGNOSIS — M5136 Other intervertebral disc degeneration, lumbar region: Secondary | ICD-10-CM | POA: Diagnosis not present

## 2018-06-20 DIAGNOSIS — M9903 Segmental and somatic dysfunction of lumbar region: Secondary | ICD-10-CM | POA: Diagnosis not present

## 2018-06-20 DIAGNOSIS — M9902 Segmental and somatic dysfunction of thoracic region: Secondary | ICD-10-CM | POA: Diagnosis not present

## 2018-06-20 DIAGNOSIS — M5416 Radiculopathy, lumbar region: Secondary | ICD-10-CM | POA: Diagnosis not present

## 2018-06-23 DIAGNOSIS — M5416 Radiculopathy, lumbar region: Secondary | ICD-10-CM | POA: Diagnosis not present

## 2018-06-23 DIAGNOSIS — M5136 Other intervertebral disc degeneration, lumbar region: Secondary | ICD-10-CM | POA: Diagnosis not present

## 2018-06-23 DIAGNOSIS — M9902 Segmental and somatic dysfunction of thoracic region: Secondary | ICD-10-CM | POA: Diagnosis not present

## 2018-06-23 DIAGNOSIS — M9903 Segmental and somatic dysfunction of lumbar region: Secondary | ICD-10-CM | POA: Diagnosis not present

## 2018-06-26 DIAGNOSIS — M5136 Other intervertebral disc degeneration, lumbar region: Secondary | ICD-10-CM | POA: Diagnosis not present

## 2018-06-26 DIAGNOSIS — M9903 Segmental and somatic dysfunction of lumbar region: Secondary | ICD-10-CM | POA: Diagnosis not present

## 2018-06-26 DIAGNOSIS — M9902 Segmental and somatic dysfunction of thoracic region: Secondary | ICD-10-CM | POA: Diagnosis not present

## 2018-06-26 DIAGNOSIS — M5416 Radiculopathy, lumbar region: Secondary | ICD-10-CM | POA: Diagnosis not present

## 2018-06-27 DIAGNOSIS — M5136 Other intervertebral disc degeneration, lumbar region: Secondary | ICD-10-CM | POA: Diagnosis not present

## 2018-06-27 DIAGNOSIS — M9902 Segmental and somatic dysfunction of thoracic region: Secondary | ICD-10-CM | POA: Diagnosis not present

## 2018-06-27 DIAGNOSIS — M5416 Radiculopathy, lumbar region: Secondary | ICD-10-CM | POA: Diagnosis not present

## 2018-06-27 DIAGNOSIS — M9903 Segmental and somatic dysfunction of lumbar region: Secondary | ICD-10-CM | POA: Diagnosis not present

## 2018-06-30 DIAGNOSIS — M5416 Radiculopathy, lumbar region: Secondary | ICD-10-CM | POA: Diagnosis not present

## 2018-06-30 DIAGNOSIS — M5136 Other intervertebral disc degeneration, lumbar region: Secondary | ICD-10-CM | POA: Diagnosis not present

## 2018-06-30 DIAGNOSIS — M9902 Segmental and somatic dysfunction of thoracic region: Secondary | ICD-10-CM | POA: Diagnosis not present

## 2018-06-30 DIAGNOSIS — M9903 Segmental and somatic dysfunction of lumbar region: Secondary | ICD-10-CM | POA: Diagnosis not present

## 2018-07-03 DIAGNOSIS — M5136 Other intervertebral disc degeneration, lumbar region: Secondary | ICD-10-CM | POA: Diagnosis not present

## 2018-07-03 DIAGNOSIS — M9902 Segmental and somatic dysfunction of thoracic region: Secondary | ICD-10-CM | POA: Diagnosis not present

## 2018-07-03 DIAGNOSIS — M9903 Segmental and somatic dysfunction of lumbar region: Secondary | ICD-10-CM | POA: Diagnosis not present

## 2018-07-03 DIAGNOSIS — M5416 Radiculopathy, lumbar region: Secondary | ICD-10-CM | POA: Diagnosis not present

## 2018-07-04 DIAGNOSIS — M5416 Radiculopathy, lumbar region: Secondary | ICD-10-CM | POA: Diagnosis not present

## 2018-07-04 DIAGNOSIS — M9902 Segmental and somatic dysfunction of thoracic region: Secondary | ICD-10-CM | POA: Diagnosis not present

## 2018-07-04 DIAGNOSIS — M9903 Segmental and somatic dysfunction of lumbar region: Secondary | ICD-10-CM | POA: Diagnosis not present

## 2018-07-04 DIAGNOSIS — M5136 Other intervertebral disc degeneration, lumbar region: Secondary | ICD-10-CM | POA: Diagnosis not present

## 2018-07-07 DIAGNOSIS — M9903 Segmental and somatic dysfunction of lumbar region: Secondary | ICD-10-CM | POA: Diagnosis not present

## 2018-07-07 DIAGNOSIS — M5416 Radiculopathy, lumbar region: Secondary | ICD-10-CM | POA: Diagnosis not present

## 2018-07-07 DIAGNOSIS — M9902 Segmental and somatic dysfunction of thoracic region: Secondary | ICD-10-CM | POA: Diagnosis not present

## 2018-07-07 DIAGNOSIS — C44729 Squamous cell carcinoma of skin of left lower limb, including hip: Secondary | ICD-10-CM | POA: Diagnosis not present

## 2018-07-07 DIAGNOSIS — D485 Neoplasm of uncertain behavior of skin: Secondary | ICD-10-CM | POA: Diagnosis not present

## 2018-07-07 DIAGNOSIS — M5136 Other intervertebral disc degeneration, lumbar region: Secondary | ICD-10-CM | POA: Diagnosis not present

## 2018-07-10 DIAGNOSIS — M5416 Radiculopathy, lumbar region: Secondary | ICD-10-CM | POA: Diagnosis not present

## 2018-07-10 DIAGNOSIS — M5136 Other intervertebral disc degeneration, lumbar region: Secondary | ICD-10-CM | POA: Diagnosis not present

## 2018-07-10 DIAGNOSIS — M9903 Segmental and somatic dysfunction of lumbar region: Secondary | ICD-10-CM | POA: Diagnosis not present

## 2018-07-10 DIAGNOSIS — M9902 Segmental and somatic dysfunction of thoracic region: Secondary | ICD-10-CM | POA: Diagnosis not present

## 2018-07-12 DIAGNOSIS — M5136 Other intervertebral disc degeneration, lumbar region: Secondary | ICD-10-CM | POA: Diagnosis not present

## 2018-07-12 DIAGNOSIS — M9902 Segmental and somatic dysfunction of thoracic region: Secondary | ICD-10-CM | POA: Diagnosis not present

## 2018-07-12 DIAGNOSIS — M5416 Radiculopathy, lumbar region: Secondary | ICD-10-CM | POA: Diagnosis not present

## 2018-07-12 DIAGNOSIS — M9903 Segmental and somatic dysfunction of lumbar region: Secondary | ICD-10-CM | POA: Diagnosis not present

## 2018-07-13 DIAGNOSIS — C44529 Squamous cell carcinoma of skin of other part of trunk: Secondary | ICD-10-CM | POA: Diagnosis not present

## 2018-07-17 DIAGNOSIS — M9902 Segmental and somatic dysfunction of thoracic region: Secondary | ICD-10-CM | POA: Diagnosis not present

## 2018-07-17 DIAGNOSIS — M9903 Segmental and somatic dysfunction of lumbar region: Secondary | ICD-10-CM | POA: Diagnosis not present

## 2018-07-17 DIAGNOSIS — M5416 Radiculopathy, lumbar region: Secondary | ICD-10-CM | POA: Diagnosis not present

## 2018-07-17 DIAGNOSIS — M5136 Other intervertebral disc degeneration, lumbar region: Secondary | ICD-10-CM | POA: Diagnosis not present

## 2018-07-19 DIAGNOSIS — M9902 Segmental and somatic dysfunction of thoracic region: Secondary | ICD-10-CM | POA: Diagnosis not present

## 2018-07-19 DIAGNOSIS — M5416 Radiculopathy, lumbar region: Secondary | ICD-10-CM | POA: Diagnosis not present

## 2018-07-19 DIAGNOSIS — M9903 Segmental and somatic dysfunction of lumbar region: Secondary | ICD-10-CM | POA: Diagnosis not present

## 2018-07-19 DIAGNOSIS — M5136 Other intervertebral disc degeneration, lumbar region: Secondary | ICD-10-CM | POA: Diagnosis not present

## 2018-07-24 DIAGNOSIS — M5416 Radiculopathy, lumbar region: Secondary | ICD-10-CM | POA: Diagnosis not present

## 2018-07-24 DIAGNOSIS — M9902 Segmental and somatic dysfunction of thoracic region: Secondary | ICD-10-CM | POA: Diagnosis not present

## 2018-07-24 DIAGNOSIS — M5136 Other intervertebral disc degeneration, lumbar region: Secondary | ICD-10-CM | POA: Diagnosis not present

## 2018-07-24 DIAGNOSIS — M9903 Segmental and somatic dysfunction of lumbar region: Secondary | ICD-10-CM | POA: Diagnosis not present

## 2018-07-26 DIAGNOSIS — M9903 Segmental and somatic dysfunction of lumbar region: Secondary | ICD-10-CM | POA: Diagnosis not present

## 2018-07-26 DIAGNOSIS — M5416 Radiculopathy, lumbar region: Secondary | ICD-10-CM | POA: Diagnosis not present

## 2018-07-26 DIAGNOSIS — M9902 Segmental and somatic dysfunction of thoracic region: Secondary | ICD-10-CM | POA: Diagnosis not present

## 2018-07-26 DIAGNOSIS — M5136 Other intervertebral disc degeneration, lumbar region: Secondary | ICD-10-CM | POA: Diagnosis not present

## 2018-07-31 DIAGNOSIS — M9902 Segmental and somatic dysfunction of thoracic region: Secondary | ICD-10-CM | POA: Diagnosis not present

## 2018-07-31 DIAGNOSIS — M9903 Segmental and somatic dysfunction of lumbar region: Secondary | ICD-10-CM | POA: Diagnosis not present

## 2018-07-31 DIAGNOSIS — M5416 Radiculopathy, lumbar region: Secondary | ICD-10-CM | POA: Diagnosis not present

## 2018-07-31 DIAGNOSIS — M5136 Other intervertebral disc degeneration, lumbar region: Secondary | ICD-10-CM | POA: Diagnosis not present

## 2018-08-02 DIAGNOSIS — M5416 Radiculopathy, lumbar region: Secondary | ICD-10-CM | POA: Diagnosis not present

## 2018-08-02 DIAGNOSIS — M9903 Segmental and somatic dysfunction of lumbar region: Secondary | ICD-10-CM | POA: Diagnosis not present

## 2018-08-02 DIAGNOSIS — M5136 Other intervertebral disc degeneration, lumbar region: Secondary | ICD-10-CM | POA: Diagnosis not present

## 2018-08-02 DIAGNOSIS — M9902 Segmental and somatic dysfunction of thoracic region: Secondary | ICD-10-CM | POA: Diagnosis not present

## 2018-08-03 DIAGNOSIS — L570 Actinic keratosis: Secondary | ICD-10-CM | POA: Diagnosis not present

## 2018-08-03 DIAGNOSIS — T8140XA Infection following a procedure, unspecified, initial encounter: Secondary | ICD-10-CM | POA: Diagnosis not present

## 2018-08-09 DIAGNOSIS — M9902 Segmental and somatic dysfunction of thoracic region: Secondary | ICD-10-CM | POA: Diagnosis not present

## 2018-08-09 DIAGNOSIS — M5136 Other intervertebral disc degeneration, lumbar region: Secondary | ICD-10-CM | POA: Diagnosis not present

## 2018-08-09 DIAGNOSIS — M9903 Segmental and somatic dysfunction of lumbar region: Secondary | ICD-10-CM | POA: Diagnosis not present

## 2018-08-09 DIAGNOSIS — M5416 Radiculopathy, lumbar region: Secondary | ICD-10-CM | POA: Diagnosis not present

## 2018-08-16 DIAGNOSIS — M9903 Segmental and somatic dysfunction of lumbar region: Secondary | ICD-10-CM | POA: Diagnosis not present

## 2018-08-16 DIAGNOSIS — M5416 Radiculopathy, lumbar region: Secondary | ICD-10-CM | POA: Diagnosis not present

## 2018-08-16 DIAGNOSIS — M9902 Segmental and somatic dysfunction of thoracic region: Secondary | ICD-10-CM | POA: Diagnosis not present

## 2018-08-16 DIAGNOSIS — M5136 Other intervertebral disc degeneration, lumbar region: Secondary | ICD-10-CM | POA: Diagnosis not present

## 2018-08-22 DIAGNOSIS — M9903 Segmental and somatic dysfunction of lumbar region: Secondary | ICD-10-CM | POA: Diagnosis not present

## 2018-08-22 DIAGNOSIS — M5136 Other intervertebral disc degeneration, lumbar region: Secondary | ICD-10-CM | POA: Diagnosis not present

## 2018-08-22 DIAGNOSIS — M9902 Segmental and somatic dysfunction of thoracic region: Secondary | ICD-10-CM | POA: Diagnosis not present

## 2018-08-22 DIAGNOSIS — M5416 Radiculopathy, lumbar region: Secondary | ICD-10-CM | POA: Diagnosis not present

## 2018-08-29 DIAGNOSIS — M9903 Segmental and somatic dysfunction of lumbar region: Secondary | ICD-10-CM | POA: Diagnosis not present

## 2018-08-29 DIAGNOSIS — M5136 Other intervertebral disc degeneration, lumbar region: Secondary | ICD-10-CM | POA: Diagnosis not present

## 2018-08-29 DIAGNOSIS — M9902 Segmental and somatic dysfunction of thoracic region: Secondary | ICD-10-CM | POA: Diagnosis not present

## 2018-08-29 DIAGNOSIS — M5416 Radiculopathy, lumbar region: Secondary | ICD-10-CM | POA: Diagnosis not present

## 2018-09-12 DIAGNOSIS — M5136 Other intervertebral disc degeneration, lumbar region: Secondary | ICD-10-CM | POA: Diagnosis not present

## 2018-09-12 DIAGNOSIS — M9903 Segmental and somatic dysfunction of lumbar region: Secondary | ICD-10-CM | POA: Diagnosis not present

## 2018-09-12 DIAGNOSIS — M5416 Radiculopathy, lumbar region: Secondary | ICD-10-CM | POA: Diagnosis not present

## 2018-09-12 DIAGNOSIS — M9902 Segmental and somatic dysfunction of thoracic region: Secondary | ICD-10-CM | POA: Diagnosis not present

## 2018-09-26 DIAGNOSIS — M5416 Radiculopathy, lumbar region: Secondary | ICD-10-CM | POA: Diagnosis not present

## 2018-09-26 DIAGNOSIS — M5136 Other intervertebral disc degeneration, lumbar region: Secondary | ICD-10-CM | POA: Diagnosis not present

## 2018-09-26 DIAGNOSIS — M9903 Segmental and somatic dysfunction of lumbar region: Secondary | ICD-10-CM | POA: Diagnosis not present

## 2018-09-26 DIAGNOSIS — M9902 Segmental and somatic dysfunction of thoracic region: Secondary | ICD-10-CM | POA: Diagnosis not present

## 2018-10-02 DIAGNOSIS — M9903 Segmental and somatic dysfunction of lumbar region: Secondary | ICD-10-CM | POA: Diagnosis not present

## 2018-10-02 DIAGNOSIS — M9902 Segmental and somatic dysfunction of thoracic region: Secondary | ICD-10-CM | POA: Diagnosis not present

## 2018-10-02 DIAGNOSIS — M5416 Radiculopathy, lumbar region: Secondary | ICD-10-CM | POA: Diagnosis not present

## 2018-10-02 DIAGNOSIS — M5136 Other intervertebral disc degeneration, lumbar region: Secondary | ICD-10-CM | POA: Diagnosis not present

## 2018-10-17 DIAGNOSIS — M5136 Other intervertebral disc degeneration, lumbar region: Secondary | ICD-10-CM | POA: Diagnosis not present

## 2018-10-17 DIAGNOSIS — M5416 Radiculopathy, lumbar region: Secondary | ICD-10-CM | POA: Diagnosis not present

## 2018-10-17 DIAGNOSIS — M9902 Segmental and somatic dysfunction of thoracic region: Secondary | ICD-10-CM | POA: Diagnosis not present

## 2018-10-17 DIAGNOSIS — M9903 Segmental and somatic dysfunction of lumbar region: Secondary | ICD-10-CM | POA: Diagnosis not present

## 2018-11-07 DIAGNOSIS — M9903 Segmental and somatic dysfunction of lumbar region: Secondary | ICD-10-CM | POA: Diagnosis not present

## 2018-11-07 DIAGNOSIS — M9902 Segmental and somatic dysfunction of thoracic region: Secondary | ICD-10-CM | POA: Diagnosis not present

## 2018-11-07 DIAGNOSIS — M5416 Radiculopathy, lumbar region: Secondary | ICD-10-CM | POA: Diagnosis not present

## 2018-11-07 DIAGNOSIS — M5136 Other intervertebral disc degeneration, lumbar region: Secondary | ICD-10-CM | POA: Diagnosis not present

## 2018-11-26 DIAGNOSIS — T8522XA Displacement of intraocular lens, initial encounter: Secondary | ICD-10-CM | POA: Diagnosis not present

## 2018-11-28 DIAGNOSIS — T8522XA Displacement of intraocular lens, initial encounter: Secondary | ICD-10-CM | POA: Diagnosis not present

## 2018-11-28 DIAGNOSIS — Z8669 Personal history of other diseases of the nervous system and sense organs: Secondary | ICD-10-CM | POA: Diagnosis not present

## 2018-12-07 ENCOUNTER — Other Ambulatory Visit: Payer: Self-pay

## 2018-12-07 ENCOUNTER — Encounter: Payer: Self-pay | Admitting: Emergency Medicine

## 2018-12-07 ENCOUNTER — Emergency Department: Payer: PPO

## 2018-12-07 ENCOUNTER — Emergency Department
Admission: EM | Admit: 2018-12-07 | Discharge: 2018-12-08 | Disposition: A | Discharge status: Home / Self Care | Payer: PPO | Attending: Emergency Medicine | Admitting: Emergency Medicine

## 2018-12-07 DIAGNOSIS — M9903 Segmental and somatic dysfunction of lumbar region: Secondary | ICD-10-CM | POA: Diagnosis not present

## 2018-12-07 DIAGNOSIS — Z79899 Other long term (current) drug therapy: Secondary | ICD-10-CM | POA: Diagnosis not present

## 2018-12-07 DIAGNOSIS — R079 Chest pain, unspecified: Secondary | ICD-10-CM | POA: Diagnosis not present

## 2018-12-07 DIAGNOSIS — Z7982 Long term (current) use of aspirin: Secondary | ICD-10-CM | POA: Insufficient documentation

## 2018-12-07 DIAGNOSIS — M9902 Segmental and somatic dysfunction of thoracic region: Secondary | ICD-10-CM | POA: Diagnosis not present

## 2018-12-07 DIAGNOSIS — M5416 Radiculopathy, lumbar region: Secondary | ICD-10-CM | POA: Diagnosis not present

## 2018-12-07 DIAGNOSIS — R0789 Other chest pain: Secondary | ICD-10-CM | POA: Diagnosis not present

## 2018-12-07 DIAGNOSIS — M5136 Other intervertebral disc degeneration, lumbar region: Secondary | ICD-10-CM | POA: Diagnosis not present

## 2018-12-07 HISTORY — DX: Acute myocardial infarction, unspecified: I21.9

## 2018-12-07 LAB — CBC WITH DIFFERENTIAL/PLATELET
Abs Immature Granulocytes: 0.07 10*3/uL (ref 0.00–0.07)
Basophils Absolute: 0.1 10*3/uL (ref 0.0–0.1)
Basophils Relative: 0 %
Eosinophils Absolute: 0.2 10*3/uL (ref 0.0–0.5)
Eosinophils Relative: 1 %
HCT: 41 % (ref 39.0–52.0)
Hemoglobin: 13.4 g/dL (ref 13.0–17.0)
Immature Granulocytes: 1 %
Lymphocytes Relative: 17 %
Lymphs Abs: 2.4 10*3/uL (ref 0.7–4.0)
MCH: 28.6 pg (ref 26.0–34.0)
MCHC: 32.7 g/dL (ref 30.0–36.0)
MCV: 87.6 fL (ref 80.0–100.0)
Monocytes Absolute: 1.2 10*3/uL — ABNORMAL HIGH (ref 0.1–1.0)
Monocytes Relative: 8 %
Neutro Abs: 10.5 10*3/uL — ABNORMAL HIGH (ref 1.7–7.7)
Neutrophils Relative %: 73 %
Platelets: 180 10*3/uL (ref 150–400)
RBC: 4.68 MIL/uL (ref 4.22–5.81)
RDW: 14.2 % (ref 11.5–15.5)
WBC: 14.4 10*3/uL — ABNORMAL HIGH (ref 4.0–10.5)
nRBC: 0 % (ref 0.0–0.2)

## 2018-12-07 LAB — BASIC METABOLIC PANEL
Anion gap: 11 (ref 5–15)
BUN: 20 mg/dL (ref 8–23)
CO2: 25 mmol/L (ref 22–32)
Calcium: 8.9 mg/dL (ref 8.9–10.3)
Chloride: 103 mmol/L (ref 98–111)
Creatinine, Ser: 1.08 mg/dL (ref 0.61–1.24)
GFR calc Af Amer: >60 mL/min (ref >60)
GFR calc non Af Amer: >60 mL/min (ref >60)
Glucose, Bld: 198 mg/dL — ABNORMAL HIGH (ref 70–99)
Potassium: 3.8 mmol/L (ref 3.5–5.1)
Sodium: 139 mmol/L (ref 135–145)

## 2018-12-07 LAB — TROPONIN I: Troponin I: <0.03 ng/mL (ref <0.03)

## 2018-12-07 MED ORDER — NITROGLYCERIN 0.4 MG SL SUBL
0.4000 mg | SUBLINGUAL_TABLET | SUBLINGUAL | Status: DC | PRN
  Administered 2018-12-07: 0.4 mg via SUBLINGUAL
  Filled 2018-12-07 (×2): qty 1

## 2018-12-07 MED ORDER — FENTANYL CITRATE (PF) 100 MCG/2ML IJ SOLN
50.0000 ug | Freq: Once | INTRAMUSCULAR | Status: AC
  Administered 2018-12-07: 50 ug via INTRAVENOUS
  Filled 2018-12-07: qty 2

## 2018-12-07 MED ORDER — NITROGLYCERIN IN D5W 200-5 MCG/ML-% IV SOLN: 0.0000 ug/min | INTRAVENOUS | Status: DC

## 2018-12-07 NOTE — ED Provider Notes (Signed)
[facility] Emergency Department Provider Note  ____________________________________________   I have reviewed the triage vital signs and the nursing notes.   HISTORY  Chief Complaint Chest Pain   History limited by: Not Limited   HPI Jon Bray is a 78 y.o. male who presents to the emergency department today because of concerns for chest pain.  He states the chest pain started around 3 PM today.  He does state that earlier in the day he had been doing some exercising and have worked out his upper body.  He was then moving some appliances.  He states pain is primarily located to the left mid chest.  It is worse with palpation.  It is worse when he tries to take a deep breath.  He does have also have some pain in his left back.  He does have a history of stents although states that this does not remind him of when he had his heart attack.  Denies any recent illness or fevers.   Records reviewed. Per medical record review patient has a history of MI with stent placement  Past Medical History:  Diagnosis Date  . MI (myocardial infarction) ([place])     There are no active problems to display for this patient.   Past Surgical History:  Procedure Laterality Date  . CARDIAC CATHETERIZATION    . CORONARY ANGIOPLASTY WITH STENT PLACEMENT      Prior to Admission medications   Medication Sig Start Date End Date Taking? Authorizing Provider  aspirin EC 81 MG tablet Take 81 mg by mouth daily at 12 noon.   Yes [provider]  atorvastatin (LIPITOR) 20 MG tablet Take 20 mg by mouth daily at 12 noon. 10/25/18  Yes [provider]  lisinopril (ZESTRIL) 10 MG tablet Take 10 mg by mouth daily. 09/12/18  Yes [provider]  metoprolol succinate (TOPROL-XL) 25 MG 24 hr tablet Take 25 mg by mouth daily at 12 noon. 08/23/18  Yes [provider]  nitroGLYCERIN (NITROSTAT) 0.4 MG SL tablet Place under the tongue. 12/01/17  Yes [provider]  ranitidine (ZANTAC) 150 MG tablet Take 150 mg by mouth 2 (two) times daily as needed.   Yes [provider]  vitamin B-12 (CYANOCOBALAMIN) 1000 MCG tablet Take 1,000 mcg by mouth daily at 12 noon.   Yes [provider]    Allergies Patient has no known allergies.  No family history on file.  Social History Social History   Tobacco Use  . Smoking status: Never Smoker  . Smokeless tobacco: Never Used  Substance Use Topics  . Alcohol use: Never    Frequency: Never  . Drug use: Never    Review of Systems Constitutional: No fever/chills Eyes: No visual changes. ENT: No sore throat. Cardiovascular: Positive for chest pain. Respiratory: Positive for shortness of breath. Gastrointestinal: No abdominal pain.  No nausea, no vomiting.  No diarrhea.   Genitourinary: Negative for dysuria. Musculoskeletal: Negative for back pain. Skin: Negative for rash. Neurological: Negative for headaches, focal weakness or numbness.  ____________________________________________   PHYSICAL EXAM:  VITAL SIGNS: ED Triage Vitals  Enc Vitals Group     BP 12/07/18 2210 (!) 141/88     Pulse Rate 12/07/18 2210 84     Resp 12/07/18 2210 18     Temp 12/07/18 2210 97.9 F (36.6 C)     Temp Source 12/07/18 2210 Oral     SpO2 12/07/18 2210 92 %  Weight 12/07/18 2213 184 lb (83.5 kg)     Height 12/07/18 2213 5\' 9"  (1.753 m)     Head Circumference --      Peak Flow --      Pain Score 12/07/18 2214 9   Constitutional: Alert and oriented.  Eyes: Conjunctivae are normal.  ENT      Head: Normocephalic and atraumatic.      Nose: No congestion/rhinnorhea.      Mouth/Throat: Mucous membranes are moist.      Neck: No stridor. Hematological/Lymphatic/Immunilogical: No cervical lymphadenopathy. Cardiovascular: Normal rate, regular rhythm.  No murmurs, rubs, or gallops.  Respiratory: Normal respiratory effort without tachypnea nor retractions. Breath sounds are clear  and equal bilaterally. No wheezes/rales/rhonchi. Gastrointestinal: Soft and non tender. No rebound. No guarding.  Genitourinary: Deferred Musculoskeletal: Normal range of motion in all extremities. No lower extremity edema. Tender to palpation to the left chest.  Neurologic:  Normal speech and language. No gross focal neurologic deficits are appreciated.  Skin:  Skin is warm, dry and intact. No rash noted. Psychiatric: Mood and affect are normal. Speech and behavior are normal. Patient exhibits appropriate insight and judgment.  ____________________________________________    LABS (pertinent positives/negatives)  Trop <0.03 CBC wbc 14.4, hgb 13.4, plt 180 BMP wnl except glu 198  ____________________________________________   EKG  I, Nance Pear, attending physician, personally viewed and interpreted this EKG  EKG Time: 2202 Rate: 80 Rhythm: sinus rhythm with 1st degree av block Axis: left axis deviation Intervals: qtc 435 QRS: narrow ST changes: no st elevation Impression: abnormal ekg  ____________________________________________    RADIOLOGY  CXR No acute abnormality  ____________________________________________   PROCEDURES  Procedures  ____________________________________________   INITIAL IMPRESSION / ASSESSMENT AND PLAN / ED COURSE  Pertinent labs & imaging results that were available during my care of the patient were reviewed by me and considered in my medical decision making (see chart for details).   Patient presented to the emergency department today because of concern for chest pain. On exam patient is tender to palpation of the left chest and did work out today. Do think likely MSK in etiology. Initial trop negative. Given patient's history will check second trop however EKG reassuring. CXR without any concerning findings.   ____________________________________________   FINAL CLINICAL IMPRESSION(S) / ED DIAGNOSES  Final diagnoses:   Chest pain, unspecified type  Chest wall pain     Note: This dictation was prepared with Dragon dictation. Any transcriptional errors that result from this process are unintentional     Nance Pear, MD 12/08/18 [phone]

## 2018-12-07 NOTE — ED Triage Notes (Signed)
Pt presents to ED with sudden onset of chest pain around 1600 today. Pt states pain has gotten progressively worse. Pain radiates across his shoulders/back and into his chest. Pain described as pressure. Pt appears very uncomfortable. Skin cool and clammy. Cardiac hx. Pt actively vomiting during triage X2. Pt reports having sob.

## 2018-12-08 ENCOUNTER — Emergency Department: Payer: PPO

## 2018-12-08 DIAGNOSIS — I2510 Atherosclerotic heart disease of native coronary artery without angina pectoris: Secondary | ICD-10-CM | POA: Diagnosis not present

## 2018-12-08 DIAGNOSIS — M5010 Cervical disc disorder with radiculopathy, unspecified cervical region: Secondary | ICD-10-CM | POA: Diagnosis not present

## 2018-12-08 DIAGNOSIS — R079 Chest pain, unspecified: Secondary | ICD-10-CM | POA: Diagnosis not present

## 2018-12-08 LAB — FIBRIN DERIVATIVES D-DIMER (ARMC ONLY): Fibrin derivatives D-dimer (ARMC): 1754.44 ng/mL (FEU) — ABNORMAL HIGH (ref 0.00–499.00)

## 2018-12-08 LAB — TROPONIN I: Troponin I: <0.03 ng/mL (ref <0.03)

## 2018-12-08 MED ORDER — CYCLOBENZAPRINE HCL 5 MG PO TABS: 5.0000 mg | ORAL_TABLET | Freq: Three times a day (TID) | ORAL | 0 refills | Status: DC | PRN

## 2018-12-08 MED ORDER — CYCLOBENZAPRINE HCL 10 MG PO TABS
5.0000 mg | ORAL_TABLET | Freq: Once | ORAL | Status: AC
  Administered 2018-12-08: 5 mg via ORAL
  Filled 2018-12-08: qty 1

## 2018-12-08 MED ORDER — IOHEXOL 350 MG/ML SOLN
75.0000 mL | Freq: Once | INTRAVENOUS | Status: AC | PRN
  Administered 2018-12-08: 75 mL via INTRAVENOUS

## 2018-12-12 DIAGNOSIS — M9902 Segmental and somatic dysfunction of thoracic region: Secondary | ICD-10-CM | POA: Diagnosis not present

## 2018-12-12 DIAGNOSIS — M5136 Other intervertebral disc degeneration, lumbar region: Secondary | ICD-10-CM | POA: Diagnosis not present

## 2018-12-12 DIAGNOSIS — M5416 Radiculopathy, lumbar region: Secondary | ICD-10-CM | POA: Diagnosis not present

## 2018-12-12 DIAGNOSIS — M9903 Segmental and somatic dysfunction of lumbar region: Secondary | ICD-10-CM | POA: Diagnosis not present

## 2018-12-14 DIAGNOSIS — L821 Other seborrheic keratosis: Secondary | ICD-10-CM | POA: Diagnosis not present

## 2018-12-14 DIAGNOSIS — D1801 Hemangioma of skin and subcutaneous tissue: Secondary | ICD-10-CM | POA: Diagnosis not present

## 2018-12-14 DIAGNOSIS — Z85828 Personal history of other malignant neoplasm of skin: Secondary | ICD-10-CM | POA: Diagnosis not present

## 2018-12-14 DIAGNOSIS — Z125 Encounter for screening for malignant neoplasm of prostate: Secondary | ICD-10-CM | POA: Diagnosis not present

## 2018-12-14 DIAGNOSIS — C44519 Basal cell carcinoma of skin of other part of trunk: Secondary | ICD-10-CM | POA: Diagnosis not present

## 2018-12-14 DIAGNOSIS — E1151 Type 2 diabetes mellitus with diabetic peripheral angiopathy without gangrene: Secondary | ICD-10-CM | POA: Diagnosis not present

## 2018-12-14 DIAGNOSIS — D485 Neoplasm of uncertain behavior of skin: Secondary | ICD-10-CM | POA: Diagnosis not present

## 2018-12-14 DIAGNOSIS — E782 Mixed hyperlipidemia: Secondary | ICD-10-CM | POA: Diagnosis not present

## 2018-12-14 DIAGNOSIS — X32XXXA Exposure to sunlight, initial encounter: Secondary | ICD-10-CM | POA: Diagnosis not present

## 2018-12-14 DIAGNOSIS — L570 Actinic keratosis: Secondary | ICD-10-CM | POA: Diagnosis not present

## 2018-12-14 DIAGNOSIS — Z08 Encounter for follow-up examination after completed treatment for malignant neoplasm: Secondary | ICD-10-CM | POA: Diagnosis not present

## 2018-12-19 DIAGNOSIS — M9903 Segmental and somatic dysfunction of lumbar region: Secondary | ICD-10-CM | POA: Diagnosis not present

## 2018-12-19 DIAGNOSIS — M9902 Segmental and somatic dysfunction of thoracic region: Secondary | ICD-10-CM | POA: Diagnosis not present

## 2018-12-19 DIAGNOSIS — M5136 Other intervertebral disc degeneration, lumbar region: Secondary | ICD-10-CM | POA: Diagnosis not present

## 2018-12-19 DIAGNOSIS — M5416 Radiculopathy, lumbar region: Secondary | ICD-10-CM | POA: Diagnosis not present

## 2018-12-21 DIAGNOSIS — E782 Mixed hyperlipidemia: Secondary | ICD-10-CM | POA: Diagnosis not present

## 2018-12-21 DIAGNOSIS — E1151 Type 2 diabetes mellitus with diabetic peripheral angiopathy without gangrene: Secondary | ICD-10-CM | POA: Diagnosis not present

## 2018-12-21 DIAGNOSIS — Z0000 Encounter for general adult medical examination without abnormal findings: Secondary | ICD-10-CM | POA: Diagnosis not present

## 2018-12-25 DIAGNOSIS — Z1159 Encounter for screening for other viral diseases: Secondary | ICD-10-CM | POA: Diagnosis not present

## 2018-12-27 DIAGNOSIS — H5989 Other postprocedural complications and disorders of eye and adnexa, not elsewhere classified: Secondary | ICD-10-CM | POA: Diagnosis not present

## 2018-12-27 DIAGNOSIS — H27132 Posterior dislocation of lens, left eye: Secondary | ICD-10-CM | POA: Diagnosis not present

## 2018-12-27 DIAGNOSIS — T8522XA Displacement of intraocular lens, initial encounter: Secondary | ICD-10-CM | POA: Diagnosis not present

## 2018-12-27 DIAGNOSIS — T85898A Other specified complication of other internal prosthetic devices, implants and grafts, initial encounter: Secondary | ICD-10-CM | POA: Diagnosis not present

## 2018-12-27 HISTORY — PX: INTRAOCULAR LENS EXCHANGE: SHX1841

## 2019-01-09 DIAGNOSIS — M5136 Other intervertebral disc degeneration, lumbar region: Secondary | ICD-10-CM | POA: Diagnosis not present

## 2019-01-09 DIAGNOSIS — M9903 Segmental and somatic dysfunction of lumbar region: Secondary | ICD-10-CM | POA: Diagnosis not present

## 2019-01-09 DIAGNOSIS — M9902 Segmental and somatic dysfunction of thoracic region: Secondary | ICD-10-CM | POA: Diagnosis not present

## 2019-01-09 DIAGNOSIS — M5416 Radiculopathy, lumbar region: Secondary | ICD-10-CM | POA: Diagnosis not present

## 2019-01-12 DIAGNOSIS — H3322 Serous retinal detachment, left eye: Secondary | ICD-10-CM | POA: Diagnosis not present

## 2019-01-13 DIAGNOSIS — H43812 Vitreous degeneration, left eye: Secondary | ICD-10-CM | POA: Diagnosis not present

## 2019-01-13 DIAGNOSIS — I2510 Atherosclerotic heart disease of native coronary artery without angina pectoris: Secondary | ICD-10-CM | POA: Diagnosis not present

## 2019-01-13 DIAGNOSIS — Z79899 Other long term (current) drug therapy: Secondary | ICD-10-CM | POA: Diagnosis not present

## 2019-01-13 DIAGNOSIS — H33012 Retinal detachment with single break, left eye: Secondary | ICD-10-CM | POA: Diagnosis not present

## 2019-01-13 DIAGNOSIS — K219 Gastro-esophageal reflux disease without esophagitis: Secondary | ICD-10-CM | POA: Diagnosis not present

## 2019-01-13 DIAGNOSIS — H33002 Unspecified retinal detachment with retinal break, left eye: Secondary | ICD-10-CM | POA: Diagnosis not present

## 2019-01-13 DIAGNOSIS — H3322 Serous retinal detachment, left eye: Secondary | ICD-10-CM | POA: Diagnosis not present

## 2019-01-13 DIAGNOSIS — I252 Old myocardial infarction: Secondary | ICD-10-CM | POA: Diagnosis not present

## 2019-01-13 DIAGNOSIS — I10 Essential (primary) hypertension: Secondary | ICD-10-CM | POA: Diagnosis not present

## 2019-01-13 DIAGNOSIS — E785 Hyperlipidemia, unspecified: Secondary | ICD-10-CM | POA: Diagnosis not present

## 2019-01-13 DIAGNOSIS — E119 Type 2 diabetes mellitus without complications: Secondary | ICD-10-CM | POA: Diagnosis not present

## 2019-01-13 HISTORY — PX: RETINAL DETACHMENT SURGERY: SHX105

## 2019-01-19 DIAGNOSIS — H3322 Serous retinal detachment, left eye: Secondary | ICD-10-CM | POA: Diagnosis not present

## 2019-01-19 DIAGNOSIS — H27132 Posterior dislocation of lens, left eye: Secondary | ICD-10-CM | POA: Diagnosis not present

## 2019-02-07 DIAGNOSIS — M9902 Segmental and somatic dysfunction of thoracic region: Secondary | ICD-10-CM | POA: Diagnosis not present

## 2019-02-07 DIAGNOSIS — M5416 Radiculopathy, lumbar region: Secondary | ICD-10-CM | POA: Diagnosis not present

## 2019-02-07 DIAGNOSIS — M5136 Other intervertebral disc degeneration, lumbar region: Secondary | ICD-10-CM | POA: Diagnosis not present

## 2019-02-07 DIAGNOSIS — M9903 Segmental and somatic dysfunction of lumbar region: Secondary | ICD-10-CM | POA: Diagnosis not present

## 2019-02-22 DIAGNOSIS — C44519 Basal cell carcinoma of skin of other part of trunk: Secondary | ICD-10-CM | POA: Diagnosis not present

## 2019-03-06 DIAGNOSIS — M5416 Radiculopathy, lumbar region: Secondary | ICD-10-CM | POA: Diagnosis not present

## 2019-03-06 DIAGNOSIS — H3322 Serous retinal detachment, left eye: Secondary | ICD-10-CM | POA: Diagnosis not present

## 2019-03-06 DIAGNOSIS — M5136 Other intervertebral disc degeneration, lumbar region: Secondary | ICD-10-CM | POA: Diagnosis not present

## 2019-03-06 DIAGNOSIS — M9902 Segmental and somatic dysfunction of thoracic region: Secondary | ICD-10-CM | POA: Diagnosis not present

## 2019-03-06 DIAGNOSIS — M9903 Segmental and somatic dysfunction of lumbar region: Secondary | ICD-10-CM | POA: Diagnosis not present

## 2019-04-03 DIAGNOSIS — M5136 Other intervertebral disc degeneration, lumbar region: Secondary | ICD-10-CM | POA: Diagnosis not present

## 2019-04-03 DIAGNOSIS — M9903 Segmental and somatic dysfunction of lumbar region: Secondary | ICD-10-CM | POA: Diagnosis not present

## 2019-04-03 DIAGNOSIS — M5416 Radiculopathy, lumbar region: Secondary | ICD-10-CM | POA: Diagnosis not present

## 2019-04-03 DIAGNOSIS — M9902 Segmental and somatic dysfunction of thoracic region: Secondary | ICD-10-CM | POA: Diagnosis not present

## 2019-04-11 DIAGNOSIS — H35359 Cystoid macular degeneration, unspecified eye: Secondary | ICD-10-CM | POA: Diagnosis not present

## 2019-04-18 DIAGNOSIS — H35359 Cystoid macular degeneration, unspecified eye: Secondary | ICD-10-CM | POA: Diagnosis not present

## 2019-05-01 DIAGNOSIS — M9902 Segmental and somatic dysfunction of thoracic region: Secondary | ICD-10-CM | POA: Diagnosis not present

## 2019-05-01 DIAGNOSIS — M5416 Radiculopathy, lumbar region: Secondary | ICD-10-CM | POA: Diagnosis not present

## 2019-05-01 DIAGNOSIS — M5136 Other intervertebral disc degeneration, lumbar region: Secondary | ICD-10-CM | POA: Diagnosis not present

## 2019-05-01 DIAGNOSIS — M9903 Segmental and somatic dysfunction of lumbar region: Secondary | ICD-10-CM | POA: Diagnosis not present

## 2019-05-02 DIAGNOSIS — H35359 Cystoid macular degeneration, unspecified eye: Secondary | ICD-10-CM | POA: Diagnosis not present

## 2019-05-18 DIAGNOSIS — X32XXXA Exposure to sunlight, initial encounter: Secondary | ICD-10-CM | POA: Diagnosis not present

## 2019-05-18 DIAGNOSIS — D485 Neoplasm of uncertain behavior of skin: Secondary | ICD-10-CM | POA: Diagnosis not present

## 2019-05-18 DIAGNOSIS — C44622 Squamous cell carcinoma of skin of right upper limb, including shoulder: Secondary | ICD-10-CM | POA: Diagnosis not present

## 2019-05-18 DIAGNOSIS — L570 Actinic keratosis: Secondary | ICD-10-CM | POA: Diagnosis not present

## 2019-05-18 DIAGNOSIS — Z872 Personal history of diseases of the skin and subcutaneous tissue: Secondary | ICD-10-CM | POA: Diagnosis not present

## 2019-05-18 DIAGNOSIS — Z08 Encounter for follow-up examination after completed treatment for malignant neoplasm: Secondary | ICD-10-CM | POA: Diagnosis not present

## 2019-05-18 DIAGNOSIS — Z85828 Personal history of other malignant neoplasm of skin: Secondary | ICD-10-CM | POA: Diagnosis not present

## 2019-05-18 DIAGNOSIS — C4442 Squamous cell carcinoma of skin of scalp and neck: Secondary | ICD-10-CM | POA: Diagnosis not present

## 2019-05-18 DIAGNOSIS — D044 Carcinoma in situ of skin of scalp and neck: Secondary | ICD-10-CM | POA: Diagnosis not present

## 2019-05-29 DIAGNOSIS — M5416 Radiculopathy, lumbar region: Secondary | ICD-10-CM | POA: Diagnosis not present

## 2019-05-29 DIAGNOSIS — M9903 Segmental and somatic dysfunction of lumbar region: Secondary | ICD-10-CM | POA: Diagnosis not present

## 2019-05-29 DIAGNOSIS — M79672 Pain in left foot: Secondary | ICD-10-CM | POA: Diagnosis not present

## 2019-05-29 DIAGNOSIS — I739 Peripheral vascular disease, unspecified: Secondary | ICD-10-CM | POA: Diagnosis not present

## 2019-05-29 DIAGNOSIS — M9902 Segmental and somatic dysfunction of thoracic region: Secondary | ICD-10-CM | POA: Diagnosis not present

## 2019-05-29 DIAGNOSIS — L600 Ingrowing nail: Secondary | ICD-10-CM | POA: Diagnosis not present

## 2019-05-29 DIAGNOSIS — M5136 Other intervertebral disc degeneration, lumbar region: Secondary | ICD-10-CM | POA: Diagnosis not present

## 2019-05-30 DIAGNOSIS — H35359 Cystoid macular degeneration, unspecified eye: Secondary | ICD-10-CM | POA: Diagnosis not present

## 2019-06-18 DIAGNOSIS — E1151 Type 2 diabetes mellitus with diabetic peripheral angiopathy without gangrene: Secondary | ICD-10-CM | POA: Diagnosis not present

## 2019-06-20 DIAGNOSIS — C4442 Squamous cell carcinoma of skin of scalp and neck: Secondary | ICD-10-CM | POA: Diagnosis not present

## 2019-06-20 DIAGNOSIS — L570 Actinic keratosis: Secondary | ICD-10-CM | POA: Diagnosis not present

## 2019-06-25 DIAGNOSIS — D500 Iron deficiency anemia secondary to blood loss (chronic): Secondary | ICD-10-CM | POA: Diagnosis not present

## 2019-06-25 DIAGNOSIS — E538 Deficiency of other specified B group vitamins: Secondary | ICD-10-CM | POA: Diagnosis not present

## 2019-06-25 DIAGNOSIS — E782 Mixed hyperlipidemia: Secondary | ICD-10-CM | POA: Diagnosis not present

## 2019-06-25 DIAGNOSIS — Z125 Encounter for screening for malignant neoplasm of prostate: Secondary | ICD-10-CM | POA: Diagnosis not present

## 2019-06-25 DIAGNOSIS — E1151 Type 2 diabetes mellitus with diabetic peripheral angiopathy without gangrene: Secondary | ICD-10-CM | POA: Diagnosis not present

## 2019-06-26 DIAGNOSIS — M9903 Segmental and somatic dysfunction of lumbar region: Secondary | ICD-10-CM | POA: Diagnosis not present

## 2019-06-26 DIAGNOSIS — M5416 Radiculopathy, lumbar region: Secondary | ICD-10-CM | POA: Diagnosis not present

## 2019-06-26 DIAGNOSIS — M5136 Other intervertebral disc degeneration, lumbar region: Secondary | ICD-10-CM | POA: Diagnosis not present

## 2019-06-26 DIAGNOSIS — M9902 Segmental and somatic dysfunction of thoracic region: Secondary | ICD-10-CM | POA: Diagnosis not present

## 2019-07-03 DIAGNOSIS — C44622 Squamous cell carcinoma of skin of right upper limb, including shoulder: Secondary | ICD-10-CM | POA: Diagnosis not present

## 2019-07-03 DIAGNOSIS — D0439 Carcinoma in situ of skin of other parts of face: Secondary | ICD-10-CM | POA: Diagnosis not present

## 2019-07-10 DIAGNOSIS — M79672 Pain in left foot: Secondary | ICD-10-CM | POA: Diagnosis not present

## 2019-07-10 DIAGNOSIS — L600 Ingrowing nail: Secondary | ICD-10-CM | POA: Diagnosis not present

## 2019-07-10 DIAGNOSIS — E119 Type 2 diabetes mellitus without complications: Secondary | ICD-10-CM | POA: Diagnosis not present

## 2019-07-23 DIAGNOSIS — E119 Type 2 diabetes mellitus without complications: Secondary | ICD-10-CM | POA: Diagnosis not present

## 2019-07-23 DIAGNOSIS — M79672 Pain in left foot: Secondary | ICD-10-CM | POA: Diagnosis not present

## 2019-07-23 DIAGNOSIS — L03032 Cellulitis of left toe: Secondary | ICD-10-CM | POA: Diagnosis not present

## 2019-07-23 DIAGNOSIS — I739 Peripheral vascular disease, unspecified: Secondary | ICD-10-CM | POA: Diagnosis not present

## 2019-07-23 DIAGNOSIS — L600 Ingrowing nail: Secondary | ICD-10-CM | POA: Diagnosis not present

## 2019-07-31 DIAGNOSIS — M9903 Segmental and somatic dysfunction of lumbar region: Secondary | ICD-10-CM | POA: Diagnosis not present

## 2019-07-31 DIAGNOSIS — M5136 Other intervertebral disc degeneration, lumbar region: Secondary | ICD-10-CM | POA: Diagnosis not present

## 2019-07-31 DIAGNOSIS — M5416 Radiculopathy, lumbar region: Secondary | ICD-10-CM | POA: Diagnosis not present

## 2019-07-31 DIAGNOSIS — M9902 Segmental and somatic dysfunction of thoracic region: Secondary | ICD-10-CM | POA: Diagnosis not present

## 2019-08-01 DIAGNOSIS — L309 Dermatitis, unspecified: Secondary | ICD-10-CM | POA: Diagnosis not present

## 2019-08-17 DIAGNOSIS — H0011 Chalazion right upper eyelid: Secondary | ICD-10-CM | POA: Diagnosis not present

## 2019-08-28 DIAGNOSIS — M9902 Segmental and somatic dysfunction of thoracic region: Secondary | ICD-10-CM | POA: Diagnosis not present

## 2019-08-28 DIAGNOSIS — M5136 Other intervertebral disc degeneration, lumbar region: Secondary | ICD-10-CM | POA: Diagnosis not present

## 2019-08-28 DIAGNOSIS — M9903 Segmental and somatic dysfunction of lumbar region: Secondary | ICD-10-CM | POA: Diagnosis not present

## 2019-08-28 DIAGNOSIS — M5416 Radiculopathy, lumbar region: Secondary | ICD-10-CM | POA: Diagnosis not present

## 2019-08-30 DIAGNOSIS — M9902 Segmental and somatic dysfunction of thoracic region: Secondary | ICD-10-CM | POA: Diagnosis not present

## 2019-08-30 DIAGNOSIS — M5416 Radiculopathy, lumbar region: Secondary | ICD-10-CM | POA: Diagnosis not present

## 2019-08-30 DIAGNOSIS — M5136 Other intervertebral disc degeneration, lumbar region: Secondary | ICD-10-CM | POA: Diagnosis not present

## 2019-08-30 DIAGNOSIS — M9903 Segmental and somatic dysfunction of lumbar region: Secondary | ICD-10-CM | POA: Diagnosis not present

## 2019-08-31 DIAGNOSIS — L570 Actinic keratosis: Secondary | ICD-10-CM | POA: Diagnosis not present

## 2019-08-31 DIAGNOSIS — X32XXXA Exposure to sunlight, initial encounter: Secondary | ICD-10-CM | POA: Diagnosis not present

## 2019-08-31 DIAGNOSIS — D485 Neoplasm of uncertain behavior of skin: Secondary | ICD-10-CM | POA: Diagnosis not present

## 2019-08-31 DIAGNOSIS — D045 Carcinoma in situ of skin of trunk: Secondary | ICD-10-CM | POA: Diagnosis not present

## 2019-09-19 DIAGNOSIS — D0462 Carcinoma in situ of skin of left upper limb, including shoulder: Secondary | ICD-10-CM | POA: Diagnosis not present

## 2019-09-19 DIAGNOSIS — D045 Carcinoma in situ of skin of trunk: Secondary | ICD-10-CM | POA: Diagnosis not present

## 2019-09-25 DIAGNOSIS — M5136 Other intervertebral disc degeneration, lumbar region: Secondary | ICD-10-CM | POA: Diagnosis not present

## 2019-09-25 DIAGNOSIS — M9902 Segmental and somatic dysfunction of thoracic region: Secondary | ICD-10-CM | POA: Diagnosis not present

## 2019-09-25 DIAGNOSIS — M9903 Segmental and somatic dysfunction of lumbar region: Secondary | ICD-10-CM | POA: Diagnosis not present

## 2019-09-25 DIAGNOSIS — M5416 Radiculopathy, lumbar region: Secondary | ICD-10-CM | POA: Diagnosis not present

## 2019-10-23 DIAGNOSIS — M9902 Segmental and somatic dysfunction of thoracic region: Secondary | ICD-10-CM | POA: Diagnosis not present

## 2019-10-23 DIAGNOSIS — M9903 Segmental and somatic dysfunction of lumbar region: Secondary | ICD-10-CM | POA: Diagnosis not present

## 2019-10-23 DIAGNOSIS — M5136 Other intervertebral disc degeneration, lumbar region: Secondary | ICD-10-CM | POA: Diagnosis not present

## 2019-10-23 DIAGNOSIS — M5416 Radiculopathy, lumbar region: Secondary | ICD-10-CM | POA: Diagnosis not present

## 2019-11-09 DIAGNOSIS — C44622 Squamous cell carcinoma of skin of right upper limb, including shoulder: Secondary | ICD-10-CM | POA: Diagnosis not present

## 2019-11-09 DIAGNOSIS — L821 Other seborrheic keratosis: Secondary | ICD-10-CM | POA: Diagnosis not present

## 2019-11-09 DIAGNOSIS — L570 Actinic keratosis: Secondary | ICD-10-CM | POA: Diagnosis not present

## 2019-11-09 DIAGNOSIS — X32XXXA Exposure to sunlight, initial encounter: Secondary | ICD-10-CM | POA: Diagnosis not present

## 2019-11-09 DIAGNOSIS — D485 Neoplasm of uncertain behavior of skin: Secondary | ICD-10-CM | POA: Diagnosis not present

## 2019-11-09 DIAGNOSIS — L218 Other seborrheic dermatitis: Secondary | ICD-10-CM | POA: Diagnosis not present

## 2019-11-20 DIAGNOSIS — M9903 Segmental and somatic dysfunction of lumbar region: Secondary | ICD-10-CM | POA: Diagnosis not present

## 2019-11-20 DIAGNOSIS — M5136 Other intervertebral disc degeneration, lumbar region: Secondary | ICD-10-CM | POA: Diagnosis not present

## 2019-11-20 DIAGNOSIS — M5134 Other intervertebral disc degeneration, thoracic region: Secondary | ICD-10-CM | POA: Diagnosis not present

## 2019-11-20 DIAGNOSIS — M9902 Segmental and somatic dysfunction of thoracic region: Secondary | ICD-10-CM | POA: Diagnosis not present

## 2019-12-10 DIAGNOSIS — M5134 Other intervertebral disc degeneration, thoracic region: Secondary | ICD-10-CM | POA: Diagnosis not present

## 2019-12-10 DIAGNOSIS — M5136 Other intervertebral disc degeneration, lumbar region: Secondary | ICD-10-CM | POA: Diagnosis not present

## 2019-12-10 DIAGNOSIS — M9903 Segmental and somatic dysfunction of lumbar region: Secondary | ICD-10-CM | POA: Diagnosis not present

## 2019-12-10 DIAGNOSIS — M9902 Segmental and somatic dysfunction of thoracic region: Secondary | ICD-10-CM | POA: Diagnosis not present

## 2019-12-13 DIAGNOSIS — M9903 Segmental and somatic dysfunction of lumbar region: Secondary | ICD-10-CM | POA: Diagnosis not present

## 2019-12-13 DIAGNOSIS — M9902 Segmental and somatic dysfunction of thoracic region: Secondary | ICD-10-CM | POA: Diagnosis not present

## 2019-12-13 DIAGNOSIS — M5134 Other intervertebral disc degeneration, thoracic region: Secondary | ICD-10-CM | POA: Diagnosis not present

## 2019-12-13 DIAGNOSIS — M5136 Other intervertebral disc degeneration, lumbar region: Secondary | ICD-10-CM | POA: Diagnosis not present

## 2019-12-21 DIAGNOSIS — Z125 Encounter for screening for malignant neoplasm of prostate: Secondary | ICD-10-CM | POA: Diagnosis not present

## 2019-12-21 DIAGNOSIS — E1151 Type 2 diabetes mellitus with diabetic peripheral angiopathy without gangrene: Secondary | ICD-10-CM | POA: Diagnosis not present

## 2019-12-28 DIAGNOSIS — E538 Deficiency of other specified B group vitamins: Secondary | ICD-10-CM | POA: Diagnosis not present

## 2019-12-28 DIAGNOSIS — Z0000 Encounter for general adult medical examination without abnormal findings: Secondary | ICD-10-CM | POA: Diagnosis not present

## 2019-12-28 DIAGNOSIS — E1151 Type 2 diabetes mellitus with diabetic peripheral angiopathy without gangrene: Secondary | ICD-10-CM | POA: Diagnosis not present

## 2020-01-03 DIAGNOSIS — H6123 Impacted cerumen, bilateral: Secondary | ICD-10-CM | POA: Diagnosis not present

## 2020-01-03 DIAGNOSIS — H6061 Unspecified chronic otitis externa, right ear: Secondary | ICD-10-CM | POA: Diagnosis not present

## 2020-01-03 DIAGNOSIS — J340 Abscess, furuncle and carbuncle of nose: Secondary | ICD-10-CM | POA: Diagnosis not present

## 2020-01-03 DIAGNOSIS — H903 Sensorineural hearing loss, bilateral: Secondary | ICD-10-CM | POA: Diagnosis not present

## 2020-01-15 DIAGNOSIS — M5136 Other intervertebral disc degeneration, lumbar region: Secondary | ICD-10-CM | POA: Diagnosis not present

## 2020-01-15 DIAGNOSIS — M9902 Segmental and somatic dysfunction of thoracic region: Secondary | ICD-10-CM | POA: Diagnosis not present

## 2020-01-15 DIAGNOSIS — M5134 Other intervertebral disc degeneration, thoracic region: Secondary | ICD-10-CM | POA: Diagnosis not present

## 2020-01-15 DIAGNOSIS — M9903 Segmental and somatic dysfunction of lumbar region: Secondary | ICD-10-CM | POA: Diagnosis not present

## 2020-02-11 DIAGNOSIS — M5134 Other intervertebral disc degeneration, thoracic region: Secondary | ICD-10-CM | POA: Diagnosis not present

## 2020-02-11 DIAGNOSIS — M9902 Segmental and somatic dysfunction of thoracic region: Secondary | ICD-10-CM | POA: Diagnosis not present

## 2020-02-11 DIAGNOSIS — M5136 Other intervertebral disc degeneration, lumbar region: Secondary | ICD-10-CM | POA: Diagnosis not present

## 2020-02-11 DIAGNOSIS — M9903 Segmental and somatic dysfunction of lumbar region: Secondary | ICD-10-CM | POA: Diagnosis not present

## 2020-02-14 DIAGNOSIS — M9903 Segmental and somatic dysfunction of lumbar region: Secondary | ICD-10-CM | POA: Diagnosis not present

## 2020-02-14 DIAGNOSIS — M5134 Other intervertebral disc degeneration, thoracic region: Secondary | ICD-10-CM | POA: Diagnosis not present

## 2020-02-14 DIAGNOSIS — M9902 Segmental and somatic dysfunction of thoracic region: Secondary | ICD-10-CM | POA: Diagnosis not present

## 2020-02-14 DIAGNOSIS — M5136 Other intervertebral disc degeneration, lumbar region: Secondary | ICD-10-CM | POA: Diagnosis not present

## 2020-03-11 DIAGNOSIS — M5136 Other intervertebral disc degeneration, lumbar region: Secondary | ICD-10-CM | POA: Diagnosis not present

## 2020-03-11 DIAGNOSIS — M5134 Other intervertebral disc degeneration, thoracic region: Secondary | ICD-10-CM | POA: Diagnosis not present

## 2020-03-11 DIAGNOSIS — M9902 Segmental and somatic dysfunction of thoracic region: Secondary | ICD-10-CM | POA: Diagnosis not present

## 2020-03-11 DIAGNOSIS — M9903 Segmental and somatic dysfunction of lumbar region: Secondary | ICD-10-CM | POA: Diagnosis not present

## 2020-03-19 DIAGNOSIS — M25562 Pain in left knee: Secondary | ICD-10-CM | POA: Diagnosis not present

## 2020-03-28 DIAGNOSIS — D2262 Melanocytic nevi of left upper limb, including shoulder: Secondary | ICD-10-CM | POA: Diagnosis not present

## 2020-03-28 DIAGNOSIS — L570 Actinic keratosis: Secondary | ICD-10-CM | POA: Diagnosis not present

## 2020-03-28 DIAGNOSIS — D2261 Melanocytic nevi of right upper limb, including shoulder: Secondary | ICD-10-CM | POA: Diagnosis not present

## 2020-03-28 DIAGNOSIS — L821 Other seborrheic keratosis: Secondary | ICD-10-CM | POA: Diagnosis not present

## 2020-03-28 DIAGNOSIS — L820 Inflamed seborrheic keratosis: Secondary | ICD-10-CM | POA: Diagnosis not present

## 2020-03-28 DIAGNOSIS — D485 Neoplasm of uncertain behavior of skin: Secondary | ICD-10-CM | POA: Diagnosis not present

## 2020-03-28 DIAGNOSIS — L538 Other specified erythematous conditions: Secondary | ICD-10-CM | POA: Diagnosis not present

## 2020-03-28 DIAGNOSIS — X32XXXA Exposure to sunlight, initial encounter: Secondary | ICD-10-CM | POA: Diagnosis not present

## 2020-03-28 DIAGNOSIS — D2271 Melanocytic nevi of right lower limb, including hip: Secondary | ICD-10-CM | POA: Diagnosis not present

## 2020-03-28 DIAGNOSIS — D225 Melanocytic nevi of trunk: Secondary | ICD-10-CM | POA: Diagnosis not present

## 2020-03-28 DIAGNOSIS — D2272 Melanocytic nevi of left lower limb, including hip: Secondary | ICD-10-CM | POA: Diagnosis not present

## 2020-04-09 DIAGNOSIS — M9903 Segmental and somatic dysfunction of lumbar region: Secondary | ICD-10-CM | POA: Diagnosis not present

## 2020-04-09 DIAGNOSIS — M5134 Other intervertebral disc degeneration, thoracic region: Secondary | ICD-10-CM | POA: Diagnosis not present

## 2020-04-09 DIAGNOSIS — M5136 Other intervertebral disc degeneration, lumbar region: Secondary | ICD-10-CM | POA: Diagnosis not present

## 2020-04-09 DIAGNOSIS — M9902 Segmental and somatic dysfunction of thoracic region: Secondary | ICD-10-CM | POA: Diagnosis not present

## 2020-06-17 DIAGNOSIS — M9902 Segmental and somatic dysfunction of thoracic region: Secondary | ICD-10-CM | POA: Diagnosis not present

## 2020-06-17 DIAGNOSIS — M9903 Segmental and somatic dysfunction of lumbar region: Secondary | ICD-10-CM | POA: Diagnosis not present

## 2020-06-17 DIAGNOSIS — M5134 Other intervertebral disc degeneration, thoracic region: Secondary | ICD-10-CM | POA: Diagnosis not present

## 2020-06-17 DIAGNOSIS — M5136 Other intervertebral disc degeneration, lumbar region: Secondary | ICD-10-CM | POA: Diagnosis not present

## 2020-06-25 DIAGNOSIS — E538 Deficiency of other specified B group vitamins: Secondary | ICD-10-CM | POA: Diagnosis not present

## 2020-06-25 DIAGNOSIS — E1151 Type 2 diabetes mellitus with diabetic peripheral angiopathy without gangrene: Secondary | ICD-10-CM | POA: Diagnosis not present

## 2020-07-01 DIAGNOSIS — Z125 Encounter for screening for malignant neoplasm of prostate: Secondary | ICD-10-CM | POA: Diagnosis not present

## 2020-07-01 DIAGNOSIS — E1151 Type 2 diabetes mellitus with diabetic peripheral angiopathy without gangrene: Secondary | ICD-10-CM | POA: Diagnosis not present

## 2020-07-01 DIAGNOSIS — E782 Mixed hyperlipidemia: Secondary | ICD-10-CM | POA: Diagnosis not present

## 2020-07-17 DIAGNOSIS — M9903 Segmental and somatic dysfunction of lumbar region: Secondary | ICD-10-CM | POA: Diagnosis not present

## 2020-07-17 DIAGNOSIS — M5136 Other intervertebral disc degeneration, lumbar region: Secondary | ICD-10-CM | POA: Diagnosis not present

## 2020-07-17 DIAGNOSIS — M5134 Other intervertebral disc degeneration, thoracic region: Secondary | ICD-10-CM | POA: Diagnosis not present

## 2020-07-17 DIAGNOSIS — M9902 Segmental and somatic dysfunction of thoracic region: Secondary | ICD-10-CM | POA: Diagnosis not present

## 2020-08-06 DIAGNOSIS — Z08 Encounter for follow-up examination after completed treatment for malignant neoplasm: Secondary | ICD-10-CM | POA: Diagnosis not present

## 2020-08-06 DIAGNOSIS — Z872 Personal history of diseases of the skin and subcutaneous tissue: Secondary | ICD-10-CM | POA: Diagnosis not present

## 2020-08-06 DIAGNOSIS — L821 Other seborrheic keratosis: Secondary | ICD-10-CM | POA: Diagnosis not present

## 2020-08-06 DIAGNOSIS — C44319 Basal cell carcinoma of skin of other parts of face: Secondary | ICD-10-CM | POA: Diagnosis not present

## 2020-08-06 DIAGNOSIS — Z85828 Personal history of other malignant neoplasm of skin: Secondary | ICD-10-CM | POA: Diagnosis not present

## 2020-08-06 DIAGNOSIS — D485 Neoplasm of uncertain behavior of skin: Secondary | ICD-10-CM | POA: Diagnosis not present

## 2020-08-14 DIAGNOSIS — M9903 Segmental and somatic dysfunction of lumbar region: Secondary | ICD-10-CM | POA: Diagnosis not present

## 2020-08-14 DIAGNOSIS — M9902 Segmental and somatic dysfunction of thoracic region: Secondary | ICD-10-CM | POA: Diagnosis not present

## 2020-08-14 DIAGNOSIS — M5134 Other intervertebral disc degeneration, thoracic region: Secondary | ICD-10-CM | POA: Diagnosis not present

## 2020-08-14 DIAGNOSIS — M5136 Other intervertebral disc degeneration, lumbar region: Secondary | ICD-10-CM | POA: Diagnosis not present

## 2020-08-14 DIAGNOSIS — Z20828 Contact with and (suspected) exposure to other viral communicable diseases: Secondary | ICD-10-CM | POA: Diagnosis not present

## 2020-09-03 DIAGNOSIS — D2339 Other benign neoplasm of skin of other parts of face: Secondary | ICD-10-CM | POA: Diagnosis not present

## 2020-09-03 DIAGNOSIS — C44319 Basal cell carcinoma of skin of other parts of face: Secondary | ICD-10-CM | POA: Diagnosis not present

## 2020-09-08 DIAGNOSIS — M722 Plantar fascial fibromatosis: Secondary | ICD-10-CM | POA: Diagnosis not present

## 2020-09-09 DIAGNOSIS — M5136 Other intervertebral disc degeneration, lumbar region: Secondary | ICD-10-CM | POA: Diagnosis not present

## 2020-09-09 DIAGNOSIS — M9903 Segmental and somatic dysfunction of lumbar region: Secondary | ICD-10-CM | POA: Diagnosis not present

## 2020-09-09 DIAGNOSIS — M5134 Other intervertebral disc degeneration, thoracic region: Secondary | ICD-10-CM | POA: Diagnosis not present

## 2020-09-09 DIAGNOSIS — M9902 Segmental and somatic dysfunction of thoracic region: Secondary | ICD-10-CM | POA: Diagnosis not present

## 2020-09-23 DIAGNOSIS — M9901 Segmental and somatic dysfunction of cervical region: Secondary | ICD-10-CM | POA: Diagnosis not present

## 2020-09-23 DIAGNOSIS — M9903 Segmental and somatic dysfunction of lumbar region: Secondary | ICD-10-CM | POA: Diagnosis not present

## 2020-09-23 DIAGNOSIS — M62830 Muscle spasm of back: Secondary | ICD-10-CM | POA: Diagnosis not present

## 2020-09-23 DIAGNOSIS — M5136 Other intervertebral disc degeneration, lumbar region: Secondary | ICD-10-CM | POA: Diagnosis not present

## 2020-10-07 DIAGNOSIS — H18831 Recurrent erosion of cornea, right eye: Secondary | ICD-10-CM | POA: Diagnosis not present

## 2020-10-21 DIAGNOSIS — M9901 Segmental and somatic dysfunction of cervical region: Secondary | ICD-10-CM | POA: Diagnosis not present

## 2020-10-21 DIAGNOSIS — M5136 Other intervertebral disc degeneration, lumbar region: Secondary | ICD-10-CM | POA: Diagnosis not present

## 2020-10-21 DIAGNOSIS — M62830 Muscle spasm of back: Secondary | ICD-10-CM | POA: Diagnosis not present

## 2020-10-21 DIAGNOSIS — M9903 Segmental and somatic dysfunction of lumbar region: Secondary | ICD-10-CM | POA: Diagnosis not present

## 2020-11-18 DIAGNOSIS — M9901 Segmental and somatic dysfunction of cervical region: Secondary | ICD-10-CM | POA: Diagnosis not present

## 2020-11-18 DIAGNOSIS — M62830 Muscle spasm of back: Secondary | ICD-10-CM | POA: Diagnosis not present

## 2020-11-18 DIAGNOSIS — M9903 Segmental and somatic dysfunction of lumbar region: Secondary | ICD-10-CM | POA: Diagnosis not present

## 2020-11-18 DIAGNOSIS — M5136 Other intervertebral disc degeneration, lumbar region: Secondary | ICD-10-CM | POA: Diagnosis not present

## 2020-11-20 DIAGNOSIS — H6123 Impacted cerumen, bilateral: Secondary | ICD-10-CM | POA: Diagnosis not present

## 2020-11-20 DIAGNOSIS — H6063 Unspecified chronic otitis externa, bilateral: Secondary | ICD-10-CM | POA: Diagnosis not present

## 2020-12-16 DIAGNOSIS — M9901 Segmental and somatic dysfunction of cervical region: Secondary | ICD-10-CM | POA: Diagnosis not present

## 2020-12-16 DIAGNOSIS — M5136 Other intervertebral disc degeneration, lumbar region: Secondary | ICD-10-CM | POA: Diagnosis not present

## 2020-12-16 DIAGNOSIS — M62830 Muscle spasm of back: Secondary | ICD-10-CM | POA: Diagnosis not present

## 2020-12-16 DIAGNOSIS — M9903 Segmental and somatic dysfunction of lumbar region: Secondary | ICD-10-CM | POA: Diagnosis not present

## 2020-12-24 DIAGNOSIS — Z79899 Other long term (current) drug therapy: Secondary | ICD-10-CM | POA: Diagnosis not present

## 2020-12-24 DIAGNOSIS — E1151 Type 2 diabetes mellitus with diabetic peripheral angiopathy without gangrene: Secondary | ICD-10-CM | POA: Diagnosis not present

## 2020-12-24 DIAGNOSIS — Z125 Encounter for screening for malignant neoplasm of prostate: Secondary | ICD-10-CM | POA: Diagnosis not present

## 2020-12-24 DIAGNOSIS — E782 Mixed hyperlipidemia: Secondary | ICD-10-CM | POA: Diagnosis not present

## 2021-01-13 DIAGNOSIS — M62830 Muscle spasm of back: Secondary | ICD-10-CM | POA: Diagnosis not present

## 2021-01-13 DIAGNOSIS — M5136 Other intervertebral disc degeneration, lumbar region: Secondary | ICD-10-CM | POA: Diagnosis not present

## 2021-01-13 DIAGNOSIS — M9901 Segmental and somatic dysfunction of cervical region: Secondary | ICD-10-CM | POA: Diagnosis not present

## 2021-01-13 DIAGNOSIS — M9903 Segmental and somatic dysfunction of lumbar region: Secondary | ICD-10-CM | POA: Diagnosis not present

## 2021-01-14 DIAGNOSIS — E538 Deficiency of other specified B group vitamins: Secondary | ICD-10-CM | POA: Diagnosis not present

## 2021-01-14 DIAGNOSIS — Z0000 Encounter for general adult medical examination without abnormal findings: Secondary | ICD-10-CM | POA: Diagnosis not present

## 2021-01-14 DIAGNOSIS — E1151 Type 2 diabetes mellitus with diabetic peripheral angiopathy without gangrene: Secondary | ICD-10-CM | POA: Diagnosis not present

## 2021-01-14 DIAGNOSIS — I25118 Atherosclerotic heart disease of native coronary artery with other forms of angina pectoris: Secondary | ICD-10-CM | POA: Diagnosis not present

## 2021-01-28 DIAGNOSIS — Z961 Presence of intraocular lens: Secondary | ICD-10-CM | POA: Diagnosis not present

## 2021-02-10 DIAGNOSIS — M9901 Segmental and somatic dysfunction of cervical region: Secondary | ICD-10-CM | POA: Diagnosis not present

## 2021-02-10 DIAGNOSIS — M62830 Muscle spasm of back: Secondary | ICD-10-CM | POA: Diagnosis not present

## 2021-02-10 DIAGNOSIS — M5136 Other intervertebral disc degeneration, lumbar region: Secondary | ICD-10-CM | POA: Diagnosis not present

## 2021-02-10 DIAGNOSIS — M9903 Segmental and somatic dysfunction of lumbar region: Secondary | ICD-10-CM | POA: Diagnosis not present

## 2021-02-25 DIAGNOSIS — C44529 Squamous cell carcinoma of skin of other part of trunk: Secondary | ICD-10-CM | POA: Diagnosis not present

## 2021-02-25 DIAGNOSIS — D485 Neoplasm of uncertain behavior of skin: Secondary | ICD-10-CM | POA: Diagnosis not present

## 2021-02-25 DIAGNOSIS — C44212 Basal cell carcinoma of skin of right ear and external auricular canal: Secondary | ICD-10-CM | POA: Diagnosis not present

## 2021-02-25 DIAGNOSIS — D225 Melanocytic nevi of trunk: Secondary | ICD-10-CM | POA: Diagnosis not present

## 2021-02-25 DIAGNOSIS — C44622 Squamous cell carcinoma of skin of right upper limb, including shoulder: Secondary | ICD-10-CM | POA: Diagnosis not present

## 2021-02-25 DIAGNOSIS — D0462 Carcinoma in situ of skin of left upper limb, including shoulder: Secondary | ICD-10-CM | POA: Diagnosis not present

## 2021-02-25 DIAGNOSIS — L570 Actinic keratosis: Secondary | ICD-10-CM | POA: Diagnosis not present

## 2021-02-25 DIAGNOSIS — X32XXXA Exposure to sunlight, initial encounter: Secondary | ICD-10-CM | POA: Diagnosis not present

## 2021-02-25 DIAGNOSIS — L821 Other seborrheic keratosis: Secondary | ICD-10-CM | POA: Diagnosis not present

## 2021-03-10 DIAGNOSIS — M9901 Segmental and somatic dysfunction of cervical region: Secondary | ICD-10-CM | POA: Diagnosis not present

## 2021-03-10 DIAGNOSIS — M5136 Other intervertebral disc degeneration, lumbar region: Secondary | ICD-10-CM | POA: Diagnosis not present

## 2021-03-10 DIAGNOSIS — M62830 Muscle spasm of back: Secondary | ICD-10-CM | POA: Diagnosis not present

## 2021-03-10 DIAGNOSIS — M9903 Segmental and somatic dysfunction of lumbar region: Secondary | ICD-10-CM | POA: Diagnosis not present

## 2021-03-26 DIAGNOSIS — D0462 Carcinoma in situ of skin of left upper limb, including shoulder: Secondary | ICD-10-CM | POA: Diagnosis not present

## 2021-03-26 DIAGNOSIS — C44629 Squamous cell carcinoma of skin of left upper limb, including shoulder: Secondary | ICD-10-CM | POA: Diagnosis not present

## 2021-04-07 DIAGNOSIS — M722 Plantar fascial fibromatosis: Secondary | ICD-10-CM | POA: Diagnosis not present

## 2021-04-07 DIAGNOSIS — R262 Difficulty in walking, not elsewhere classified: Secondary | ICD-10-CM | POA: Diagnosis not present

## 2021-04-08 DIAGNOSIS — C4442 Squamous cell carcinoma of skin of scalp and neck: Secondary | ICD-10-CM | POA: Diagnosis not present

## 2021-04-08 DIAGNOSIS — L905 Scar conditions and fibrosis of skin: Secondary | ICD-10-CM | POA: Diagnosis not present

## 2021-04-08 DIAGNOSIS — D0461 Carcinoma in situ of skin of right upper limb, including shoulder: Secondary | ICD-10-CM | POA: Diagnosis not present

## 2021-04-08 DIAGNOSIS — C44622 Squamous cell carcinoma of skin of right upper limb, including shoulder: Secondary | ICD-10-CM | POA: Diagnosis not present

## 2021-04-14 DIAGNOSIS — C44212 Basal cell carcinoma of skin of right ear and external auricular canal: Secondary | ICD-10-CM | POA: Diagnosis not present

## 2021-04-15 DIAGNOSIS — M5136 Other intervertebral disc degeneration, lumbar region: Secondary | ICD-10-CM | POA: Diagnosis not present

## 2021-04-15 DIAGNOSIS — M62830 Muscle spasm of back: Secondary | ICD-10-CM | POA: Diagnosis not present

## 2021-04-15 DIAGNOSIS — M9903 Segmental and somatic dysfunction of lumbar region: Secondary | ICD-10-CM | POA: Diagnosis not present

## 2021-04-15 DIAGNOSIS — M9901 Segmental and somatic dysfunction of cervical region: Secondary | ICD-10-CM | POA: Diagnosis not present

## 2021-05-05 DIAGNOSIS — M9901 Segmental and somatic dysfunction of cervical region: Secondary | ICD-10-CM | POA: Diagnosis not present

## 2021-05-05 DIAGNOSIS — M5136 Other intervertebral disc degeneration, lumbar region: Secondary | ICD-10-CM | POA: Diagnosis not present

## 2021-05-05 DIAGNOSIS — M62830 Muscle spasm of back: Secondary | ICD-10-CM | POA: Diagnosis not present

## 2021-05-05 DIAGNOSIS — M9903 Segmental and somatic dysfunction of lumbar region: Secondary | ICD-10-CM | POA: Diagnosis not present

## 2021-05-20 DIAGNOSIS — M5136 Other intervertebral disc degeneration, lumbar region: Secondary | ICD-10-CM | POA: Diagnosis not present

## 2021-05-20 DIAGNOSIS — M9903 Segmental and somatic dysfunction of lumbar region: Secondary | ICD-10-CM | POA: Diagnosis not present

## 2021-05-20 DIAGNOSIS — M62830 Muscle spasm of back: Secondary | ICD-10-CM | POA: Diagnosis not present

## 2021-05-20 DIAGNOSIS — M9901 Segmental and somatic dysfunction of cervical region: Secondary | ICD-10-CM | POA: Diagnosis not present

## 2021-05-25 DIAGNOSIS — M9901 Segmental and somatic dysfunction of cervical region: Secondary | ICD-10-CM | POA: Diagnosis not present

## 2021-05-25 DIAGNOSIS — M9903 Segmental and somatic dysfunction of lumbar region: Secondary | ICD-10-CM | POA: Diagnosis not present

## 2021-05-25 DIAGNOSIS — M62830 Muscle spasm of back: Secondary | ICD-10-CM | POA: Diagnosis not present

## 2021-05-25 DIAGNOSIS — M5136 Other intervertebral disc degeneration, lumbar region: Secondary | ICD-10-CM | POA: Diagnosis not present

## 2021-06-09 DIAGNOSIS — M722 Plantar fascial fibromatosis: Secondary | ICD-10-CM | POA: Diagnosis not present

## 2021-07-05 DIAGNOSIS — Z87442 Personal history of urinary calculi: Secondary | ICD-10-CM

## 2021-07-05 HISTORY — DX: Personal history of urinary calculi: Z87.442

## 2021-07-09 DIAGNOSIS — E1151 Type 2 diabetes mellitus with diabetic peripheral angiopathy without gangrene: Secondary | ICD-10-CM | POA: Diagnosis not present

## 2021-07-09 DIAGNOSIS — E782 Mixed hyperlipidemia: Secondary | ICD-10-CM | POA: Diagnosis not present

## 2021-07-09 DIAGNOSIS — E538 Deficiency of other specified B group vitamins: Secondary | ICD-10-CM | POA: Diagnosis not present

## 2021-07-15 DIAGNOSIS — M9903 Segmental and somatic dysfunction of lumbar region: Secondary | ICD-10-CM | POA: Diagnosis not present

## 2021-07-15 DIAGNOSIS — M62830 Muscle spasm of back: Secondary | ICD-10-CM | POA: Diagnosis not present

## 2021-07-15 DIAGNOSIS — M5136 Other intervertebral disc degeneration, lumbar region: Secondary | ICD-10-CM | POA: Diagnosis not present

## 2021-07-15 DIAGNOSIS — M9901 Segmental and somatic dysfunction of cervical region: Secondary | ICD-10-CM | POA: Diagnosis not present

## 2021-07-16 DIAGNOSIS — L538 Other specified erythematous conditions: Secondary | ICD-10-CM | POA: Diagnosis not present

## 2021-07-16 DIAGNOSIS — I2510 Atherosclerotic heart disease of native coronary artery without angina pectoris: Secondary | ICD-10-CM | POA: Diagnosis not present

## 2021-07-16 DIAGNOSIS — D2261 Melanocytic nevi of right upper limb, including shoulder: Secondary | ICD-10-CM | POA: Diagnosis not present

## 2021-07-16 DIAGNOSIS — D2272 Melanocytic nevi of left lower limb, including hip: Secondary | ICD-10-CM | POA: Diagnosis not present

## 2021-07-16 DIAGNOSIS — E782 Mixed hyperlipidemia: Secondary | ICD-10-CM | POA: Diagnosis not present

## 2021-07-16 DIAGNOSIS — D2262 Melanocytic nevi of left upper limb, including shoulder: Secondary | ICD-10-CM | POA: Diagnosis not present

## 2021-07-16 DIAGNOSIS — I25118 Atherosclerotic heart disease of native coronary artery with other forms of angina pectoris: Secondary | ICD-10-CM | POA: Diagnosis not present

## 2021-07-16 DIAGNOSIS — D0472 Carcinoma in situ of skin of left lower limb, including hip: Secondary | ICD-10-CM | POA: Diagnosis not present

## 2021-07-16 DIAGNOSIS — D225 Melanocytic nevi of trunk: Secondary | ICD-10-CM | POA: Diagnosis not present

## 2021-07-16 DIAGNOSIS — Z125 Encounter for screening for malignant neoplasm of prostate: Secondary | ICD-10-CM | POA: Diagnosis not present

## 2021-07-16 DIAGNOSIS — L570 Actinic keratosis: Secondary | ICD-10-CM | POA: Diagnosis not present

## 2021-07-16 DIAGNOSIS — C44529 Squamous cell carcinoma of skin of other part of trunk: Secondary | ICD-10-CM | POA: Diagnosis not present

## 2021-07-16 DIAGNOSIS — L821 Other seborrheic keratosis: Secondary | ICD-10-CM | POA: Diagnosis not present

## 2021-07-16 DIAGNOSIS — E1151 Type 2 diabetes mellitus with diabetic peripheral angiopathy without gangrene: Secondary | ICD-10-CM | POA: Diagnosis not present

## 2021-07-16 DIAGNOSIS — D2271 Melanocytic nevi of right lower limb, including hip: Secondary | ICD-10-CM | POA: Diagnosis not present

## 2021-07-16 DIAGNOSIS — D485 Neoplasm of uncertain behavior of skin: Secondary | ICD-10-CM | POA: Diagnosis not present

## 2021-07-16 DIAGNOSIS — L820 Inflamed seborrheic keratosis: Secondary | ICD-10-CM | POA: Diagnosis not present

## 2021-08-01 ENCOUNTER — Emergency Department
Admission: EM | Admit: 2021-08-01 | Discharge: 2021-08-01 | Disposition: A | Discharge status: Home / Self Care | Payer: No Typology Code available for payment source | Attending: Emergency Medicine | Admitting: Emergency Medicine

## 2021-08-01 ENCOUNTER — Encounter: Payer: Self-pay | Admitting: Emergency Medicine

## 2021-08-01 ENCOUNTER — Emergency Department: Payer: No Typology Code available for payment source

## 2021-08-01 ENCOUNTER — Other Ambulatory Visit: Payer: Self-pay

## 2021-08-01 DIAGNOSIS — K5730 Diverticulosis of large intestine without perforation or abscess without bleeding: Secondary | ICD-10-CM | POA: Diagnosis not present

## 2021-08-01 DIAGNOSIS — E119 Type 2 diabetes mellitus without complications: Secondary | ICD-10-CM | POA: Insufficient documentation

## 2021-08-01 DIAGNOSIS — R911 Solitary pulmonary nodule: Secondary | ICD-10-CM

## 2021-08-01 DIAGNOSIS — I10 Essential (primary) hypertension: Secondary | ICD-10-CM | POA: Diagnosis not present

## 2021-08-01 DIAGNOSIS — N200 Calculus of kidney: Secondary | ICD-10-CM | POA: Insufficient documentation

## 2021-08-01 DIAGNOSIS — N132 Hydronephrosis with renal and ureteral calculous obstruction: Secondary | ICD-10-CM | POA: Diagnosis not present

## 2021-08-01 DIAGNOSIS — R112 Nausea with vomiting, unspecified: Secondary | ICD-10-CM | POA: Diagnosis not present

## 2021-08-01 DIAGNOSIS — R0789 Other chest pain: Secondary | ICD-10-CM | POA: Diagnosis not present

## 2021-08-01 DIAGNOSIS — I700 Atherosclerosis of aorta: Secondary | ICD-10-CM | POA: Diagnosis not present

## 2021-08-01 DIAGNOSIS — M4316 Spondylolisthesis, lumbar region: Secondary | ICD-10-CM | POA: Diagnosis not present

## 2021-08-01 DIAGNOSIS — K4090 Unilateral inguinal hernia, without obstruction or gangrene, not specified as recurrent: Secondary | ICD-10-CM | POA: Diagnosis not present

## 2021-08-01 DIAGNOSIS — R079 Chest pain, unspecified: Secondary | ICD-10-CM | POA: Diagnosis not present

## 2021-08-01 DIAGNOSIS — R109 Unspecified abdominal pain: Secondary | ICD-10-CM | POA: Diagnosis present

## 2021-08-01 DIAGNOSIS — R9431 Abnormal electrocardiogram [ECG] [EKG]: Secondary | ICD-10-CM | POA: Diagnosis not present

## 2021-08-01 HISTORY — DX: Solitary pulmonary nodule: R91.1

## 2021-08-01 LAB — COMPREHENSIVE METABOLIC PANEL
ALT: 27 U/L (ref 0–44)
AST: 32 U/L (ref 15–41)
Albumin: 3.9 g/dL (ref 3.5–5.0)
Alkaline Phosphatase: 49 U/L (ref 38–126)
Anion gap: 9 (ref 5–15)
BUN: 19 mg/dL (ref 8–23)
CO2: 25 mmol/L (ref 22–32)
Calcium: 8.3 mg/dL — ABNORMAL LOW (ref 8.9–10.3)
Chloride: 103 mmol/L (ref 98–111)
Creatinine, Ser: 0.98 mg/dL (ref 0.61–1.24)
GFR, Estimated: >60 mL/min (ref >60)
Glucose, Bld: 187 mg/dL — ABNORMAL HIGH (ref 70–99)
Potassium: 3.6 mmol/L (ref 3.5–5.1)
Sodium: 137 mmol/L (ref 135–145)
Total Bilirubin: 0.9 mg/dL (ref 0.3–1.2)
Total Protein: 7.4 g/dL (ref 6.5–8.1)

## 2021-08-01 LAB — CBC
HCT: 42.8 % (ref 39.0–52.0)
Hemoglobin: 13.7 g/dL (ref 13.0–17.0)
MCH: 28.6 pg (ref 26.0–34.0)
MCHC: 32 g/dL (ref 30.0–36.0)
MCV: 89.4 fL (ref 80.0–100.0)
Platelets: 186 10*3/uL (ref 150–400)
RBC: 4.79 MIL/uL (ref 4.22–5.81)
RDW: 14.7 % (ref 11.5–15.5)
WBC: 8.3 10*3/uL (ref 4.0–10.5)
nRBC: 0 % (ref 0.0–0.2)

## 2021-08-01 LAB — URINALYSIS, ROUTINE W REFLEX MICROSCOPIC
Bacteria, UA: NONE SEEN
Bilirubin Urine: NEGATIVE
Glucose, UA: NEGATIVE mg/dL
Ketones, ur: NEGATIVE mg/dL
Leukocytes,Ua: NEGATIVE
Nitrite: NEGATIVE
Protein, ur: 100 mg/dL — AB
Specific Gravity, Urine: 1.025 (ref 1.005–1.030)
pH: 6 (ref 5.0–8.0)

## 2021-08-01 LAB — LIPASE, BLOOD: Lipase: 32 U/L (ref 11–51)

## 2021-08-01 LAB — TROPONIN I (HIGH SENSITIVITY): Troponin I (High Sensitivity): 17 ng/L (ref <18)

## 2021-08-01 MED ORDER — ONDANSETRON 4 MG PO TBDP: 4.0000 mg | ORAL_TABLET | Freq: Three times a day (TID) | ORAL | 0 refills | Status: AC | PRN

## 2021-08-01 MED ORDER — OXYCODONE HCL 5 MG PO TABS
5.0000 mg | ORAL_TABLET | Freq: Four times a day (QID) | ORAL | 0 refills | Status: AC | PRN
Start: 2021-08-01 — End: 2021-08-06

## 2021-08-01 MED ORDER — TAMSULOSIN HCL 0.4 MG PO CAPS
0.4000 mg | ORAL_CAPSULE | Freq: Every day | ORAL | 0 refills | Status: AC
Start: 2021-08-01 — End: 2021-08-15

## 2021-08-01 MED ORDER — HYDROMORPHONE HCL 1 MG/ML IJ SOLN: 0.5000 mg | Freq: Once | INTRAMUSCULAR | Status: DC

## 2021-08-01 NOTE — ED Provider Notes (Signed)
[facility] Provider Note    Event Date/Time   First MD Initiated Contact with Patient 08/01/21 1121     (approximate)   History   Abdominal Pain   HPI  JEDAIAH RATHBUN is a 81 y.o. male with prior MI, diabetes who comes in with concerns for left flank pain.  Patient reports having sudden onset of left flank pain this morning.  Patient was given 4 mg of Zofran prior to arrival.  Patient was a little hypertensive.  He denies any shortness of breath or chest pain.  He states the pain, radiates into his abdomen.  He states that he has had some associated nausea.  Denies any vomiting, diarrhea.  Denies any chest pain, shortness of breath or other concerns.  States it feels like when he had a prior kidney stone.  He reports that currently his pain is now gone  Physical Exam   Triage Vital Signs: ED Triage Vitals  Enc Vitals Group     BP 08/01/21 0900 (!) 150/75     Pulse Rate 08/01/21 0900 66     Resp 08/01/21 0900 18     Temp 08/01/21 0900 98.1 F (36.7 C)     Temp Source 08/01/21 0900 Oral     SpO2 08/01/21 0900 97 %     Weight 08/01/21 0900 182 lb (82.6 kg)     Height 08/01/21 0900 5\' 9"  (1.753 m)     Head Circumference --      Peak Flow --      Pain Score 08/01/21 0909 8     Pain Loc --      Pain Edu? --      Excl. in [place]? --     Most recent vital signs: Vitals:   08/01/21 0900  BP: (!) 150/75  Pulse: 66  Resp: 18  Temp: 98.1 F (36.7 C)  SpO2: 97%     General: Awake, no distress.  CV:  Good peripheral perfusion.  Resp:  Normal effort.  Abd:  No distention.  Soft and nontender    ED Results / Procedures / Treatments   Labs (all labs ordered are listed, but only abnormal results are displayed) Labs Reviewed  COMPREHENSIVE METABOLIC PANEL - Abnormal; Notable for the following components:      Result Value   Glucose, Bld 187 (*)    Calcium 8.3 (*)    All other components within normal limits  URINALYSIS, ROUTINE W REFLEX  MICROSCOPIC - Abnormal; Notable for the following components:   Hgb urine dipstick MODERATE (*)    Protein, ur 100 (*)    All other components within normal limits  LIPASE, BLOOD  CBC  TROPONIN I (HIGH SENSITIVITY)  TROPONIN I (HIGH SENSITIVITY)     EKG  My interpretation of EKG: Normal sinus rate of 63 without any ST elevation or T wave inversions except for lead III and aVF with type I AV block.  Reviewed his prior EKG and it looks similar with similar T wave inversions and type I   RADIOLOGY I have reviewed the CT  personally and agree with radiology read patient is a kidney stone on the left 5 mm   PROCEDURES:  Critical Care performed: No  Procedures   MEDICATIONS ORDERED IN ED: Medications - No data to display   IMPRESSION / MDM / [place] / ED COURSE  I reviewed the triage vital signs and the nursing notes.  Differential diagnosis includes, but is not limited to, kidney stone, pyelonephritis.  Lower suspicion for dissection, ACS given no chest pain.  No shortness of breath suggest PE.  CBC white count is normal no signs of infection CMP slightly elevated sugar but no DKA Lipase is normal UA with RBCs but no signs of infection   CT scan is consistent with kidney stone.  Patient's pain is well controlled at this time.  Denies any pain.  We discussed treatment with Tylenol, ibuprofen, oxycodone for breakthrough pain.  Incidental findings on CT were discussed with patient patient provided a copy of report.  Patient given urology follow-up.  Recommended MiraLAX to help with constipation  Troponin was ordered in triage but patient denies any chest pain his EKG looks similar to prior so do not feel we need to repeat troponin    I discussed the provisional nature of ED diagnosis, the treatment so far, the ongoing plan of care, follow up appointments and return precautions with the patient and any family or support people  present. They expressed understanding and agreed with the plan, discharged home.       FINAL CLINICAL IMPRESSION(S) / ED DIAGNOSES   Final diagnoses:  Kidney stone     Rx / DC Orders   ED Discharge Orders          Ordered    oxyCODONE (ROXICODONE) 5 MG immediate release tablet  Every 6 hours PRN        08/01/21 1205    ondansetron (ZOFRAN-ODT) 4 MG disintegrating tablet  Every 8 hours PRN        08/01/21 1205    tamsulosin (FLOMAX) 0.4 MG CAPS capsule  Daily        08/01/21 1205             Note:  This document was prepared using Dragon voice recognition software and may include unintentional dictation errors.   Vanessa [place], MD 08/01/21 [phone]

## 2021-08-01 NOTE — ED Triage Notes (Signed)
Pt via POV from home. Pt c/o L side pain near his lower ribs that radiates to his L flank. Denies any injury. Pt states pain woke him up out of his sleep. Pt endorses nausea. Denies diarrhea. Pt is A&OX4 and NAD.   EMS gave pt 4 of Zofran

## 2021-08-01 NOTE — ED Provider Triage Note (Signed)
Emergency Medicine Provider Triage Evaluation Note  Jon Bray , a 81 y.o. male  was evaluated in triage.  Pt complains of abdominal pain. Left flank this AM. H/o Kidney stone.No burning when peeing. No blood in urine. Breathing okay. Now resolved.   Review of Systems  Positive: L flank pain  Negative:  Fever   Physical Exam  BP (!) 150/75 (BP Location: Right Arm)    Pulse 66    Temp 98.1 F (36.7 C) (Oral)    Resp 18    Ht 5\' 9"  (1.753 m)    Wt 82.6 kg    SpO2 97%    BMI 26.88 kg/m  Gen:   Awake, no distress   Resp:  Normal effort  MSK:   Moves extremities without difficulty  Other:  L flank  pain   Medical Decision Making  Medically screening exam initiated at 10:09 AM.  Appropriate orders placed.  Jon Bray was informed that the remainder of the evaluation will be completed by another provider, this initial triage assessment does not replace that evaluation, and the importance of remaining in the ED until their evaluation is complete.  Ct renal pending.  UA    Jon Ball, MD 08/01/21 1014

## 2021-08-01 NOTE — Discharge Instructions (Addendum)
You have a kidney stone. See report below.   Take ibuprofen 600mg  every 8 hours daily (as long as you are not on any other blood thinners or have kidney disease) Take tylenol 1g every 8 hours daily. Take oxycodone for breakthrough pain. Do not drive, work, or operate machinery while on this.  Start off with 1/2 tab in case it makes him dizzy. Take zofran to help with nausea. Take Flomax to help dilate uretha. Take capful miralax over the counter daily for constipation. Call urology number above to schedule outpatient appointment. Return to ED for fevers, unable to keep food down, or any other concerns.     IMPRESSION: 1. Left hydroureteronephrosis to the level of a 5 mm stone in the distal ureter just proximal to the UVJ. 2. Moderate-size fat containing right inguinal hernia which also contains a portion of nonobstructed bladder. 3. Extensive sigmoid colonic diverticulosis without findings of acute diverticulitis. 4. Moderate volume of formed stool throughout the colon suggestive of constipation. 5. 4 mm left lower lobe perifissural pulmonary nodule. No routine follow-up imaging is recommended per Fleischner Society Guidelines. These guidelines do not apply to immunocompromised patients and patients with cancer. Follow up in patients with significant comorbidities as clinically warranted. For lung cancer screening, adhere to Lung-RADS guidelines. Reference: Radiology. 2017; 284(1):228-43; Radiology. 2012; 265(2):611-6; Radiology. 2010; 254(3):949-56. 6. Aortic Atherosclerosis (ICD10-I70.0).  Take oxycodone as prescribed. Do not drink alcohol, drive or participate in any other potentially dangerous activities while taking this medication as it may make you sleepy. Do not take this medication with any other sedating medications, either prescription or over-the-counter. If you were prescribed Percocet or Vicodin, do not take these with acetaminophen (Tylenol) as it is already contained  within these medications.  This medication is an opiate (or narcotic) pain medication and can be habit forming. Use it as little as possible to achieve adequate pain control. Do not use or use it with extreme caution if you have a history of opiate abuse or dependence. If you are on a pain contract with your primary care doctor or a pain specialist, be sure to let them know you were prescribed this medication today from the Emergency Department. This medication is intended for your use only - do not give any to anyone else and keep it in a secure place where nobody else, especially children, have access to it.

## 2021-08-01 NOTE — ED Notes (Addendum)
First Nurse Note:  Pt to ED via [facility] c/o left flank pain. Pt has hx/o Kidney stones. Pt given 4 mg of Zofran PTA.  VSS 202- CBG- hx/o DM 170/90 20 G left hand

## 2021-08-04 DIAGNOSIS — N200 Calculus of kidney: Secondary | ICD-10-CM | POA: Diagnosis not present

## 2021-08-04 DIAGNOSIS — E1151 Type 2 diabetes mellitus with diabetic peripheral angiopathy without gangrene: Secondary | ICD-10-CM | POA: Diagnosis not present

## 2021-08-04 DIAGNOSIS — N23 Unspecified renal colic: Secondary | ICD-10-CM | POA: Diagnosis not present

## 2021-08-04 NOTE — H&P (View-Only) (Signed)
08/05/21 2:55 PM   [name] 1940-12-18 [id number]  Referring provider:  Rusty Aus, MD [facility] [place],   [address] Chief Complaint  Patient presents with   Nephrolithiasis     HPI: Jon Bray is a 81 y.o.male who presents today for further evaluation of kidney stones.   He was seen in the ED on 08/01/2021 with  sudden onset of left flank pain. UA had moderate blood. CT renal stone study revealed Left hydroureteronephrosis to the level of a 5 mm stone in the distal ureter just proximal to the UVJ. No additional left-sided nephrolithiasis right kidney is unremarkable without hydronephrosis or nephrolithiasis.Portion of the urinary bladder is herniated into the moderate-sized right inguinal hernia. He was discharged on flomax.   He continues to have a fairly high pain requirement, has been taking 2 tablets of narcotic every 4 hours scheduled.  His last pain episode was this morning.  He denies ongoing fevers chills, nausea or vomiting.  He continues to have some left lower quadrant pain.  Urinalysis consistent with stone, see below.  He had a stone 30 years ago which did not require any intervention.  He denies any bulging  of abdomen he had had slight urinary frequency but denies any infections.    PMH: Past Medical History:  Diagnosis Date   MI (myocardial infarction) [facility])     Surgical History: Past Surgical History:  Procedure Laterality Date   CARDIAC CATHETERIZATION     CORONARY ANGIOPLASTY WITH STENT PLACEMENT      Home Medications:  Allergies as of 08/05/2021   No Known Allergies      Medication List        Accurate as of August 05, 2021  2:55 PM. If you have any questions, ask your nurse or doctor.          STOP taking these medications    cyclobenzaprine 5 MG tablet Commonly known as: FLEXERIL Stopped by: Hollice Espy, MD       TAKE these medications    aspirin EC 81 MG  tablet Take 81 mg by mouth daily at 12 noon.   atorvastatin 20 MG tablet Commonly known as: LIPITOR Take 20 mg by mouth daily at 12 noon.   lisinopril 10 MG tablet Commonly known as: ZESTRIL Take 10 mg by mouth daily.   metoprolol succinate 25 MG 24 hr tablet Commonly known as: TOPROL-XL Take 25 mg by mouth daily at 12 noon.   nitroGLYCERIN 0.4 MG SL tablet Commonly known as: NITROSTAT Place under the tongue.   ondansetron 4 MG disintegrating tablet Commonly known as: ZOFRAN-ODT Take 1 tablet (4 mg total) by mouth every 8 (eight) hours as needed for up to 5 days for nausea or vomiting.   oxyCODONE 5 MG immediate release tablet Commonly known as: Roxicodone Take 1 tablet (5 mg total) by mouth every 6 (six) hours as needed for up to 5 days.   ranitidine 150 MG tablet Commonly known as: ZANTAC Take 150 mg by mouth 2 (two) times daily as needed.   tamsulosin 0.4 MG Caps capsule Commonly known as: FLOMAX Take 1 capsule (0.4 mg total) by mouth daily for 14 days.   vitamin B-12 1000 MCG tablet Commonly known as: CYANOCOBALAMIN Take 1,000 mcg by mouth daily at 12 noon.        Allergies: No Known Allergies  Family History: No family history on file.  Social History:  reports that he has  never smoked. He has never used smokeless tobacco. He reports that he does not drink alcohol and does not use drugs.   Physical Exam: BP 128/77    Pulse 91    Ht 5\' 9"  (1.753 m)    Wt 184 lb (83.5 kg)    BMI 27.17 kg/m   Constitutional:  Alert and oriented, No acute distress.  Accompanied by his wife today. HEENT: [place] AT, moist mucus membranes.  Trachea midline, no masses. Cardiovascular: No clubbing, cyanosis, or edema. Respiratory: Normal respiratory effort, no increased work of breathing. Skin: No rashes, bruises or suspicious lesions. Neurologic: Grossly intact, no focal deficits, moving all 4 extremities. Psychiatric: Normal mood and affect.  Laboratory Data:  Lab Results   Component Value Date   CREATININE 0.98 08/01/2021     Urinalysis Results for orders placed or performed in visit on 08/05/21  Microscopic Examination   Urine  Result Value Ref Range   WBC, UA 6-10 (A) 0 - 5 /hpf   RBC 3-10 (A) 0 - 2 /hpf   Epithelial Cells (non renal) 0-10 0 - 10 /hpf   Casts Present (A) None seen /lpf   Cast Type Hyaline casts N/A   Bacteria, UA None seen None seen/Few  Urinalysis, Complete  Result Value Ref Range   Specific Gravity, UA 1.020 1.005 - 1.030   pH, UA 6.5 5.0 - 7.5   Color, UA Orange Yellow   Appearance Ur Hazy (A) Clear   Leukocytes,UA Trace (A) Negative   Protein,UA 1+ (A) Negative/Trace   Glucose, UA Negative Negative   Ketones, UA Negative Negative   RBC, UA Trace (A) Negative   Bilirubin, UA Negative Negative   Urobilinogen, Ur 4.0 (H) 0.2 - 1.0 mg/dL   Nitrite, UA Negative Negative   Microscopic Examination See below:      Pertinent Imaging: CLINICAL DATA:  Flank pain, renal stone suspected.   EXAM: CT ABDOMEN AND PELVIS WITHOUT CONTRAST   TECHNIQUE: Multidetector CT imaging of the abdomen and pelvis was performed following the standard protocol without IV contrast.   RADIATION DOSE REDUCTION: This exam was performed according to the departmental dose-optimization program which includes automated exposure control, adjustment of the mA and/or kV according to patient size and/or use of iterative reconstruction technique.   COMPARISON:  June 20, 2015   FINDINGS: Lower chest: 4 mm left lower lobe perifissural pulmonary nodule on image 5/4   Hepatobiliary: Unremarkable noncontrast appearance of the hepatic parenchyma. Gallbladder is unremarkable. No biliary ductal dilation.   Pancreas: No pancreatic ductal dilation or evidence of acute inflammation.   Spleen: Normal in size without focal abnormality.   Adrenals/Urinary Tract: Bilateral adrenal glands appear normal.   Left hydroureteronephrosis to the level of a 5  mm stone in the distal ureter just proximal to the UVJ. No additional left-sided nephrolithiasis right kidney is unremarkable without hydronephrosis or nephrolithiasis.   Portion of the urinary bladder is herniated into the moderate-sized right inguinal hernia.   Stomach/Bowel: No enteric contrast was administered. Stomach is predominantly decompressed limiting evaluation. No pathologic dilation of small or large bowel. The appendix and terminal ileum appear normal. Moderate volume of formed stool throughout the colon suggestive of constipation. Extensive sigmoid colonic diverticulosis without findings of acute diverticulitis.   Vascular/Lymphatic: Aortic and branch vessel atherosclerosis without abdominal aortic aneurysm. No pathologically enlarged abdominal or pelvic lymph nodes.   Reproductive: Prostate is unremarkable.   Other: No significant abdominopelvic free fluid. Moderate-size fat containing right inguinal hernia which also  contains a portion of nonobstructed bladder.   Musculoskeletal: Multilevel degenerative changes spine with mild L2 on L3 degenerative retrolisthesis. No acute osseous abnormality.   IMPRESSION: 1. Left hydroureteronephrosis to the level of a 5 mm stone in the distal ureter just proximal to the UVJ. 2. Moderate-size fat containing right inguinal hernia which also contains a portion of nonobstructed bladder. 3. Extensive sigmoid colonic diverticulosis without findings of acute diverticulitis. 4. Moderate volume of formed stool throughout the colon suggestive of constipation. 5. 4 mm left lower lobe perifissural pulmonary nodule. No routine follow-up imaging is recommended per Fleischner Society Guidelines. These guidelines do not apply to immunocompromised patients and patients with cancer. Follow up in patients with significant comorbidities as clinically warranted. For lung cancer screening, adhere to Lung-RADS guidelines. Reference: Radiology.  2017; 284(1):228-43; Radiology. 2012; 265(2):611-6; Radiology. 2010; 254(3):949-56. 6. Aortic Atherosclerosis (ICD10-I70.0).     Electronically Signed   By: Dahlia Bailiff M.D.   On: 08/01/2021 10:48   The above CT scan was personally reviewed.  Agree with radiologic interpretation.  There is a right inguinal hernia which contains a portion of the bladder.  Left UVJ stone was also appreciated with obstructive changes.  Assessment & Plan:    Left hydroureteronephrosis /left ureteral calculus - We discussed various treatment options for urolithiasis including observation with or without medical expulsive therapy, shockwave lithotripsy (SWL), ureteroscopy and laser lithotripsy with stent placement, and percutaneous nephrolithotomy.   We discussed that management is based on stone size, location, density, patient co-morbidities, and patient preference.    Stones <43mm in size have a >80% spontaneous passage rate. Data surrounding the use of tamsulosin for medical expulsive therapy is controversial, but meta analyses suggests it is most efficacious for distal stones between 5-29mm in size. Possible side effects include dizziness/lightheadedness, and retrograde ejaculation.   SWL has a lower stone free rate in a single procedure, but also a lower complication rate compared to ureteroscopy and avoids a stent and associated stent related symptoms. Possible complications include renal hematoma, steinstrasse, and need for additional treatment. We discussed the role of his increased skin to stone distance can lead to decreased efficacy with shockwave lithotripsy.   Ureteroscopy with laser lithotripsy and stent placement has a higher stone free rate than SWL in a single procedure, however increased complication rate including possible infection, ureteral injury, bleeding, and stent related morbidity. Common stent related symptoms include dysuria, urgency/frequency, and flank pain.   After an extensive  discussion of the risks and benefits of the above treatment options, the patient would like to proceed with ESWL - Urine sent for pre-operative culture   2. Right inguinal hernia with bladder prolapse  -Relatively minimally symptomatic, has some urgency frequency but no inguinal pain or significant urinary symptoms -We will discuss further at f/u   I,Kailey Littlejohn,acting as a scribe for Hollice Espy, MD.,have documented all relevant documentation on the behalf of Hollice Espy, MD,as directed by  Hollice Espy, MD while in the presence of Hollice Espy, MD.  I have reviewed the above documentation for accuracy and completeness, and I agree with the above.   Hollice Espy, MD   [facility] [address], [place], [place] [address] [phone]

## 2021-08-04 NOTE — Progress Notes (Addendum)
08/05/21 2:55 PM   [name] 04-26-1941 [id number]  Referring provider:  Rusty Aus, MD [facility] [place],  [place] [address] Chief Complaint  Patient presents with   Nephrolithiasis     HPI: Jon Bray is a 81 y.o.male who presents today for further evaluation of kidney stones.   He was seen in the ED on 08/01/2021 with  sudden onset of left flank pain. UA had moderate blood. CT renal stone study revealed Left hydroureteronephrosis to the level of a 5 mm stone in the distal ureter just proximal to the UVJ. No additional left-sided nephrolithiasis right kidney is unremarkable without hydronephrosis or nephrolithiasis.Portion of the urinary bladder is herniated into the moderate-sized right inguinal hernia. He was discharged on flomax.   He continues to have a fairly high pain requirement, has been taking 2 tablets of narcotic every 4 hours scheduled.  His last pain episode was this morning.  He denies ongoing fevers chills, nausea or vomiting.  He continues to have some left lower quadrant pain.  Urinalysis consistent with stone, see below.  He had a stone 30 years ago which did not require any intervention.  He denies any bulging  of abdomen he had had slight urinary frequency but denies any infections.    PMH: Past Medical History:  Diagnosis Date   MI (myocardial infarction) [facility])     Surgical History: Past Surgical History:  Procedure Laterality Date   CARDIAC CATHETERIZATION     CORONARY ANGIOPLASTY WITH STENT PLACEMENT      Home Medications:  Allergies as of 08/05/2021   No Known Allergies      Medication List        Accurate as of August 05, 2021  2:55 PM. If you have any questions, ask your nurse or doctor.          STOP taking these medications    cyclobenzaprine 5 MG tablet Commonly known as: FLEXERIL Stopped by: Hollice Espy, MD       TAKE these medications    aspirin EC 81 MG  tablet Take 81 mg by mouth daily at 12 noon.   atorvastatin 20 MG tablet Commonly known as: LIPITOR Take 20 mg by mouth daily at 12 noon.   lisinopril 10 MG tablet Commonly known as: ZESTRIL Take 10 mg by mouth daily.   metoprolol succinate 25 MG 24 hr tablet Commonly known as: TOPROL-XL Take 25 mg by mouth daily at 12 noon.   nitroGLYCERIN 0.4 MG SL tablet Commonly known as: NITROSTAT Place under the tongue.   ondansetron 4 MG disintegrating tablet Commonly known as: ZOFRAN-ODT Take 1 tablet (4 mg total) by mouth every 8 (eight) hours as needed for up to 5 days for nausea or vomiting.   oxyCODONE 5 MG immediate release tablet Commonly known as: Roxicodone Take 1 tablet (5 mg total) by mouth every 6 (six) hours as needed for up to 5 days.   ranitidine 150 MG tablet Commonly known as: ZANTAC Take 150 mg by mouth 2 (two) times daily as needed.   tamsulosin 0.4 MG Caps capsule Commonly known as: FLOMAX Take 1 capsule (0.4 mg total) by mouth daily for 14 days.   vitamin B-12 1000 MCG tablet Commonly known as: CYANOCOBALAMIN Take 1,000 mcg by mouth daily at 12 noon.        Allergies: No Known Allergies  Family History: No family history on file.  Social History:  reports that he has  never smoked. He has never used smokeless tobacco. He reports that he does not drink alcohol and does not use drugs.   Physical Exam: BP 128/77    Pulse 91    Ht 5\' 9"  (1.753 m)    Wt 184 lb (83.5 kg)    BMI 27.17 kg/m   Constitutional:  Alert and oriented, No acute distress.  Accompanied by his wife today. HEENT: [place] AT, moist mucus membranes.  Trachea midline, no masses. Cardiovascular: No clubbing, cyanosis, or edema. Respiratory: Normal respiratory effort, no increased work of breathing. Skin: No rashes, bruises or suspicious lesions. Neurologic: Grossly intact, no focal deficits, moving all 4 extremities. Psychiatric: Normal mood and affect.  Laboratory Data:  Lab Results   Component Value Date   CREATININE 0.98 08/01/2021     Urinalysis Results for orders placed or performed in visit on 08/05/21  Microscopic Examination   Urine  Result Value Ref Range   WBC, UA 6-10 (A) 0 - 5 /hpf   RBC 3-10 (A) 0 - 2 /hpf   Epithelial Cells (non renal) 0-10 0 - 10 /hpf   Casts Present (A) None seen /lpf   Cast Type Hyaline casts N/A   Bacteria, UA None seen None seen/Few  Urinalysis, Complete  Result Value Ref Range   Specific Gravity, UA 1.020 1.005 - 1.030   pH, UA 6.5 5.0 - 7.5   Color, UA Orange Yellow   Appearance Ur Hazy (A) Clear   Leukocytes,UA Trace (A) Negative   Protein,UA 1+ (A) Negative/Trace   Glucose, UA Negative Negative   Ketones, UA Negative Negative   RBC, UA Trace (A) Negative   Bilirubin, UA Negative Negative   Urobilinogen, Ur 4.0 (H) 0.2 - 1.0 mg/dL   Nitrite, UA Negative Negative   Microscopic Examination See below:      Pertinent Imaging: CLINICAL DATA:  Flank pain, renal stone suspected.   EXAM: CT ABDOMEN AND PELVIS WITHOUT CONTRAST   TECHNIQUE: Multidetector CT imaging of the abdomen and pelvis was performed following the standard protocol without IV contrast.   RADIATION DOSE REDUCTION: This exam was performed according to the departmental dose-optimization program which includes automated exposure control, adjustment of the mA and/or kV according to patient size and/or use of iterative reconstruction technique.   COMPARISON:  June 20, 2015   FINDINGS: Lower chest: 4 mm left lower lobe perifissural pulmonary nodule on image 5/4   Hepatobiliary: Unremarkable noncontrast appearance of the hepatic parenchyma. Gallbladder is unremarkable. No biliary ductal dilation.   Pancreas: No pancreatic ductal dilation or evidence of acute inflammation.   Spleen: Normal in size without focal abnormality.   Adrenals/Urinary Tract: Bilateral adrenal glands appear normal.   Left hydroureteronephrosis to the level of a 5  mm stone in the distal ureter just proximal to the UVJ. No additional left-sided nephrolithiasis right kidney is unremarkable without hydronephrosis or nephrolithiasis.   Portion of the urinary bladder is herniated into the moderate-sized right inguinal hernia.   Stomach/Bowel: No enteric contrast was administered. Stomach is predominantly decompressed limiting evaluation. No pathologic dilation of small or large bowel. The appendix and terminal ileum appear normal. Moderate volume of formed stool throughout the colon suggestive of constipation. Extensive sigmoid colonic diverticulosis without findings of acute diverticulitis.   Vascular/Lymphatic: Aortic and branch vessel atherosclerosis without abdominal aortic aneurysm. No pathologically enlarged abdominal or pelvic lymph nodes.   Reproductive: Prostate is unremarkable.   Other: No significant abdominopelvic free fluid. Moderate-size fat containing right inguinal hernia which also  contains a portion of nonobstructed bladder.   Musculoskeletal: Multilevel degenerative changes spine with mild L2 on L3 degenerative retrolisthesis. No acute osseous abnormality.   IMPRESSION: 1. Left hydroureteronephrosis to the level of a 5 mm stone in the distal ureter just proximal to the UVJ. 2. Moderate-size fat containing right inguinal hernia which also contains a portion of nonobstructed bladder. 3. Extensive sigmoid colonic diverticulosis without findings of acute diverticulitis. 4. Moderate volume of formed stool throughout the colon suggestive of constipation. 5. 4 mm left lower lobe perifissural pulmonary nodule. No routine follow-up imaging is recommended per Fleischner Society Guidelines. These guidelines do not apply to immunocompromised patients and patients with cancer. Follow up in patients with significant comorbidities as clinically warranted. For lung cancer screening, adhere to Lung-RADS guidelines. Reference: Radiology.  2017; 284(1):228-43; Radiology. 2012; 265(2):611-6; Radiology. 2010; 254(3):949-56. 6. Aortic Atherosclerosis (ICD10-I70.0).     Electronically Signed   By: Dahlia Bailiff M.D.   On: 08/01/2021 10:48   The above CT scan was personally reviewed.  Agree with radiologic interpretation.  There is a right inguinal hernia which contains a portion of the bladder.  Left UVJ stone was also appreciated with obstructive changes.  Assessment & Plan:    Left hydroureteronephrosis /left ureteral calculus - We discussed various treatment options for urolithiasis including observation with or without medical expulsive therapy, shockwave lithotripsy (SWL), ureteroscopy and laser lithotripsy with stent placement, and percutaneous nephrolithotomy.   We discussed that management is based on stone size, location, density, patient co-morbidities, and patient preference.    Stones <6mm in size have a >80% spontaneous passage rate. Data surrounding the use of tamsulosin for medical expulsive therapy is controversial, but meta analyses suggests it is most efficacious for distal stones between 5-6mm in size. Possible side effects include dizziness/lightheadedness, and retrograde ejaculation.   SWL has a lower stone free rate in a single procedure, but also a lower complication rate compared to ureteroscopy and avoids a stent and associated stent related symptoms. Possible complications include renal hematoma, steinstrasse, and need for additional treatment. We discussed the role of his increased skin to stone distance can lead to decreased efficacy with shockwave lithotripsy.   Ureteroscopy with laser lithotripsy and stent placement has a higher stone free rate than SWL in a single procedure, however increased complication rate including possible infection, ureteral injury, bleeding, and stent related morbidity. Common stent related symptoms include dysuria, urgency/frequency, and flank pain.   After an extensive  discussion of the risks and benefits of the above treatment options, the patient would like to proceed with ESWL - Urine sent for pre-operative culture   2. Right inguinal hernia with bladder prolapse  -Relatively minimally symptomatic, has some urgency frequency but no inguinal pain or significant urinary symptoms -We will discuss further at f/u   I,Kailey Littlejohn,acting as a scribe for Hollice Espy, MD.,have documented all relevant documentation on the behalf of Hollice Espy, MD,as directed by  Hollice Espy, MD while in the presence of Hollice Espy, MD.  I have reviewed the above documentation for accuracy and completeness, and I agree with the above.   Hollice Espy, MD   [facility] [address] [place], [place] [address] [phone]

## 2021-08-05 ENCOUNTER — Other Ambulatory Visit: Payer: Self-pay

## 2021-08-05 ENCOUNTER — Ambulatory Visit (INDEPENDENT_AMBULATORY_CARE_PROVIDER_SITE_OTHER): Payer: No Typology Code available for payment source | Admitting: Urology

## 2021-08-05 ENCOUNTER — Encounter: Payer: Self-pay | Admitting: Urology

## 2021-08-05 VITALS — BP 128/77 | HR 91 | Ht 69.0 in | Wt 184.0 lb

## 2021-08-05 DIAGNOSIS — N201 Calculus of ureter: Secondary | ICD-10-CM

## 2021-08-05 DIAGNOSIS — K4090 Unilateral inguinal hernia, without obstruction or gangrene, not specified as recurrent: Secondary | ICD-10-CM

## 2021-08-05 DIAGNOSIS — N200 Calculus of kidney: Secondary | ICD-10-CM | POA: Diagnosis not present

## 2021-08-06 ENCOUNTER — Encounter
Admission: RE | Disposition: A | Discharge status: Home / Self Care | Payer: Self-pay | Source: Home / Self Care | Attending: Urology

## 2021-08-06 ENCOUNTER — Other Ambulatory Visit: Payer: Self-pay

## 2021-08-06 ENCOUNTER — Ambulatory Visit
Admission: RE | Admit: 2021-08-06 | Discharge: 2021-08-06 | Disposition: A | Discharge status: Home / Self Care | Payer: No Typology Code available for payment source | Attending: Urology | Admitting: Urology

## 2021-08-06 ENCOUNTER — Ambulatory Visit: Payer: No Typology Code available for payment source

## 2021-08-06 ENCOUNTER — Encounter: Payer: Self-pay | Admitting: Urology

## 2021-08-06 DIAGNOSIS — N132 Hydronephrosis with renal and ureteral calculous obstruction: Secondary | ICD-10-CM | POA: Diagnosis not present

## 2021-08-06 DIAGNOSIS — M47816 Spondylosis without myelopathy or radiculopathy, lumbar region: Secondary | ICD-10-CM | POA: Diagnosis not present

## 2021-08-06 DIAGNOSIS — K4090 Unilateral inguinal hernia, without obstruction or gangrene, not specified as recurrent: Secondary | ICD-10-CM | POA: Diagnosis not present

## 2021-08-06 DIAGNOSIS — N200 Calculus of kidney: Secondary | ICD-10-CM | POA: Diagnosis not present

## 2021-08-06 DIAGNOSIS — I878 Other specified disorders of veins: Secondary | ICD-10-CM | POA: Diagnosis not present

## 2021-08-06 DIAGNOSIS — N201 Calculus of ureter: Secondary | ICD-10-CM | POA: Diagnosis not present

## 2021-08-06 HISTORY — PX: EXTRACORPOREAL SHOCK WAVE LITHOTRIPSY: SHX1557

## 2021-08-06 LAB — URINALYSIS, COMPLETE
Bilirubin, UA: NEGATIVE
Glucose, UA: NEGATIVE
Ketones, UA: NEGATIVE
Nitrite, UA: NEGATIVE
Specific Gravity, UA: 1.02 (ref 1.005–1.030)
Urobilinogen, Ur: 4 mg/dL — ABNORMAL HIGH (ref 0.2–1.0)
pH, UA: 6.5 (ref 5.0–7.5)

## 2021-08-06 LAB — MICROSCOPIC EXAMINATION: Bacteria, UA: NONE SEEN

## 2021-08-06 SURGERY — LITHOTRIPSY, ESWL
Anesthesia: Moderate Sedation | Laterality: Left

## 2021-08-06 MED ORDER — DIPHENHYDRAMINE HCL 25 MG PO CAPS
ORAL_CAPSULE | ORAL | Status: AC
  Filled 2021-08-06: qty 1

## 2021-08-06 MED ORDER — ONDANSETRON HCL 4 MG/2ML IJ SOLN
INTRAMUSCULAR | Status: AC
  Administered 2021-08-06: 4 mg via INTRAVENOUS
  Filled 2021-08-06: qty 2

## 2021-08-06 MED ORDER — DIPHENHYDRAMINE HCL 25 MG PO CAPS
25.0000 mg | ORAL_CAPSULE | ORAL | Status: AC
  Administered 2021-08-06: 25 mg via ORAL

## 2021-08-06 MED ORDER — DIAZEPAM 5 MG PO TABS
ORAL_TABLET | ORAL | Status: AC
  Filled 2021-08-06: qty 2

## 2021-08-06 MED ORDER — CEPHALEXIN 500 MG PO CAPS
ORAL_CAPSULE | ORAL | Status: AC
  Filled 2021-08-06: qty 1

## 2021-08-06 MED ORDER — CEPHALEXIN 500 MG PO CAPS
500.0000 mg | ORAL_CAPSULE | Freq: Once | ORAL | Status: AC
  Administered 2021-08-06: 500 mg via ORAL

## 2021-08-06 MED ORDER — ONDANSETRON HCL 4 MG/2ML IJ SOLN: 4.0000 mg | Freq: Once | INTRAMUSCULAR | Status: AC

## 2021-08-06 MED ORDER — SODIUM CHLORIDE 0.9 % IV SOLN: INTRAVENOUS | Status: DC

## 2021-08-06 MED ORDER — DIAZEPAM 5 MG PO TABS
10.0000 mg | ORAL_TABLET | ORAL | Status: AC
  Administered 2021-08-06: 10 mg via ORAL

## 2021-08-06 NOTE — Discharge Instructions (Addendum)
See [facility] discharge instructions in chart. AMBULATORY SURGERY  DISCHARGE INSTRUCTIONS   The drugs that you were given will stay in your system until tomorrow so for the next 24 hours you should not:  Drive an automobile Make any legal decisions Drink any alcoholic beverage   You may resume regular meals tomorrow.  Today it is better to start with liquids and gradually work up to solid foods.  You may eat anything you prefer, but it is better to start with liquids, then soup and crackers, and gradually work up to solid foods.   Please notify your doctor immediately if you have any unusual bleeding, trouble breathing, redness and pain at the surgery site, drainage, fever, or pain not relieved by medication.    Additional Instructions:        Please contact your physician with any problems or Same Day Surgery at [phone], Monday through Friday 6 am to 4 pm, or [facility] [place] Main number at [phone].  

## 2021-08-06 NOTE — Interval H&P Note (Signed)
History and Physical Interval Note:  08/06/2021 12:24 PM  Jon Bray  has presented today for surgery, with the diagnosis of Left Ureteral Stone.  The various methods of treatment have been discussed with the patient and family. After consideration of risks, benefits and other options for treatment, the patient has consented to  Procedure(s): EXTRACORPOREAL SHOCK WAVE LITHOTRIPSY (ESWL) (Left) as a surgical intervention.  The patient's history has been reviewed, patient examined, no change in status, stable for surgery.  I have reviewed the patient's chart and labs.  Questions were answered to the patient's satisfaction.    RRR CTAB   Hollice Espy

## 2021-08-07 ENCOUNTER — Encounter: Payer: Self-pay | Admitting: Urology

## 2021-08-07 LAB — CULTURE, URINE COMPREHENSIVE

## 2021-08-11 NOTE — Addendum Note (Signed)
Addended by: Gerald Leitz A on: 08/11/2021 08:55 AM   Modules accepted: Orders

## 2021-08-21 DIAGNOSIS — K8050 Calculus of bile duct without cholangitis or cholecystitis without obstruction: Secondary | ICD-10-CM | POA: Diagnosis not present

## 2021-08-21 DIAGNOSIS — E1151 Type 2 diabetes mellitus with diabetic peripheral angiopathy without gangrene: Secondary | ICD-10-CM | POA: Diagnosis not present

## 2021-08-21 DIAGNOSIS — R1013 Epigastric pain: Secondary | ICD-10-CM | POA: Diagnosis not present

## 2021-08-24 ENCOUNTER — Other Ambulatory Visit: Payer: Self-pay | Admitting: Family Medicine

## 2021-08-24 DIAGNOSIS — R1013 Epigastric pain: Secondary | ICD-10-CM

## 2021-08-24 DIAGNOSIS — K8050 Calculus of bile duct without cholangitis or cholecystitis without obstruction: Secondary | ICD-10-CM

## 2021-08-27 NOTE — Progress Notes (Signed)
08/28/2021 8:02 AM   Jon Bray 1941/05/04 [id number]  Referring provider: Rusty Aus, MD [facility] [place],  [place] [id number]  Chief Complaint  Patient presents with   Nephrolithiasis   Urological history: 1. Nephrolithiasis -spontaneous passage of stone 30 years ago   HPI: Jon Bray is a 81 y.o. who is status post ESWL who presents today for follow up.  Underwent ESWL on 08/06/2021 for a 5 mm left distal ureteral stone with Dr. Erlene Quan.  Their postprocedural course was as expected and uneventful.   They have passed fragments.   They bring in fragments for analysis.   KUB 5 mm left distal ureteral stone is not appreciated on today's KUB  UA negative for micro heme  PMH: Past Medical History:  Diagnosis Date   MI (myocardial infarction) ([place])     Surgical History: Past Surgical History:  Procedure Laterality Date   CARDIAC CATHETERIZATION     CORONARY ANGIOPLASTY WITH STENT PLACEMENT     EXTRACORPOREAL SHOCK WAVE LITHOTRIPSY Left 08/06/2021   Procedure: EXTRACORPOREAL SHOCK WAVE LITHOTRIPSY (ESWL);  Surgeon: Hollice Espy, MD;  Location: [facility] ORS;  Service: Urology;  Laterality: Left;    Home Medications:  Current Outpatient Medications on File Prior to Visit  Medication Sig Dispense Refill   aspirin EC 81 MG tablet Take 81 mg by mouth daily at 12 noon.     atorvastatin (LIPITOR) 20 MG tablet Take 20 mg by mouth daily at 12 noon.     nitroGLYCERIN (NITROSTAT) 0.4 MG SL tablet Place under the tongue.     vitamin B-12 (CYANOCOBALAMIN) 1000 MCG tablet Take 1,000 mcg by mouth daily at 12 noon.     No current facility-administered medications on file prior to visit.    Allergies: No Known Allergies  Family History: History reviewed. No pertinent family history.  Social History:  reports that he has never smoked. He has never used smokeless tobacco. He reports that he does not drink alcohol and does not use  drugs.  ROS: Pertinent ROS in HPI  Physical Exam: BP 138/74    Pulse 82   Constitutional:  Well nourished. Alert and oriented, No acute distress. HEENT: [place] AT, mask in place.   Trachea midline. Cardiovascular: No clubbing, cyanosis, or edema. Respiratory: Normal respiratory effort, no increased work of breathing. Neurologic: Grossly intact, no focal deficits, moving all 4 extremities. Psychiatric: Normal mood and affect.  Laboratory Data: Lab Results  Component Value Date   WBC 8.3 08/01/2021   HGB 13.7 08/01/2021   HCT 42.8 08/01/2021   MCV 89.4 08/01/2021   PLT 186 08/01/2021    Lab Results  Component Value Date   CREATININE 0.98 08/01/2021    Urinalysis Component     Latest Ref Rng & Units 08/28/2021  Specific Gravity, UA     1.005 - 1.030 >1.030 (H)  pH, UA     5.0 - 7.5 5.5  Color, UA     Yellow Orange  Appearance Ur     Clear Clear  Leukocytes,UA     Negative Negative  Protein,UA     Negative/Trace 1+ (A)  Glucose, UA     Negative Trace (A)  Ketones, UA     Negative Trace (A)  RBC, UA     Negative Negative  Bilirubin, UA     Negative Negative  Urobilinogen, Ur     0.2 - 1.0 mg/dL 1.0  Nitrite, UA     Negative Negative  Microscopic Examination      See below:   Component     Latest Ref Rng & Units 08/28/2021  WBC, UA     0 - 5 /hpf 0-5  RBC     0 - 2 /hpf None seen  Epithelial Cells (non renal)     0 - 10 /hpf 0-10  Mucus, UA     Not Estab. Present (A)  Bacteria, UA     None seen/Few None seen  I have reviewed the labs.   Pertinent Imaging: CLINICAL DATA:  Left kidney stone.  Status post ESWL.   EXAM: ABDOMEN - 1 VIEW   COMPARISON:  Abdominal radiograph dated 08/06/2021.   FINDINGS: The previously seen stone in the region of the left ureterovesical junction is not visualized on today's exam. Multiple pelvic phleboliths noted. No radiopaque foci noted over the renal silhouette. There is moderate stool throughout the colon. No  bowel dilatation or evidence of obstruction. No free air. Atherosclerotic calcification of the aorta. Degenerative changes of the spine. No acute osseous pathology.   IMPRESSION: The previously seen left ureterovesical junction stone is not visualized on today's exam.     Electronically Signed   By: Anner Crete M.D.   On: 08/31/2021 02:20 I have independently reviewed the films.    Assessment & Plan:    1. Left ureteral stone -Left distal stone no longer visible on today's KUB -Discussed general stone preventative measures such as increasing water intake to a gallon daily, increasing citric acids in his diet, reducing sodium in the diet and reducing protein in his diet -Given the booklet ABCs of stone prevention  Return in about 6 months (around 02/25/2022) for KUB and office visit .  These notes generated with voice recognition software. I apologize for typographical errors.  Zara Council, PA-C  [facility] [address]  [place], [place] [address] [phone]

## 2021-08-28 ENCOUNTER — Other Ambulatory Visit: Payer: Self-pay

## 2021-08-28 ENCOUNTER — Ambulatory Visit (INDEPENDENT_AMBULATORY_CARE_PROVIDER_SITE_OTHER): Payer: No Typology Code available for payment source | Admitting: Urology

## 2021-08-28 ENCOUNTER — Encounter: Payer: Self-pay | Admitting: Urology

## 2021-08-28 ENCOUNTER — Ambulatory Visit
Admission: RE | Admit: 2021-08-28 | Discharge: 2021-08-28 | Disposition: A | Discharge status: Home / Self Care | Payer: No Typology Code available for payment source | Source: Ambulatory Visit | Attending: Urology | Admitting: Urology

## 2021-08-28 VITALS — BP 138/74 | HR 82

## 2021-08-28 DIAGNOSIS — N200 Calculus of kidney: Secondary | ICD-10-CM

## 2021-08-28 DIAGNOSIS — Z87442 Personal history of urinary calculi: Secondary | ICD-10-CM | POA: Diagnosis not present

## 2021-08-28 DIAGNOSIS — N201 Calculus of ureter: Secondary | ICD-10-CM | POA: Diagnosis not present

## 2021-08-28 DIAGNOSIS — I700 Atherosclerosis of aorta: Secondary | ICD-10-CM | POA: Diagnosis not present

## 2021-08-28 DIAGNOSIS — I878 Other specified disorders of veins: Secondary | ICD-10-CM | POA: Diagnosis not present

## 2021-08-28 LAB — URINALYSIS, COMPLETE
Bilirubin, UA: NEGATIVE
Leukocytes,UA: NEGATIVE
Nitrite, UA: NEGATIVE
RBC, UA: NEGATIVE
Specific Gravity, UA: >1.03 — ABNORMAL HIGH (ref 1.005–1.030)
Urobilinogen, Ur: 1 mg/dL (ref 0.2–1.0)
pH, UA: 5.5 (ref 5.0–7.5)

## 2021-08-28 LAB — MICROSCOPIC EXAMINATION
Bacteria, UA: NONE SEEN
RBC, Urine: NONE SEEN /hpf (ref 0–2)

## 2021-09-01 LAB — CALCULI, WITH PHOTOGRAPH (CLINICAL LAB)
Calcium Oxalate Dihydrate: 40 %
Calcium Oxalate Monohydrate: 60 %
Weight Calculi: 90 mg

## 2021-09-03 ENCOUNTER — Other Ambulatory Visit: Payer: No Typology Code available for payment source

## 2021-09-03 ENCOUNTER — Encounter
Admission: RE | Admit: 2021-09-03 | Discharge: 2021-09-03 | Disposition: A | Discharge status: Home / Self Care | Payer: No Typology Code available for payment source | Source: Ambulatory Visit | Attending: Family Medicine | Admitting: Family Medicine

## 2021-09-03 ENCOUNTER — Other Ambulatory Visit: Payer: Self-pay

## 2021-09-03 DIAGNOSIS — R1013 Epigastric pain: Secondary | ICD-10-CM | POA: Insufficient documentation

## 2021-09-03 DIAGNOSIS — K8050 Calculus of bile duct without cholangitis or cholecystitis without obstruction: Secondary | ICD-10-CM | POA: Diagnosis not present

## 2021-09-03 MED ORDER — TECHNETIUM TC 99M MEBROFENIN IV KIT
5.0000 | PACK | Freq: Once | INTRAVENOUS | Status: AC | PRN
  Administered 2021-09-03: 5.35 via INTRAVENOUS

## 2021-09-09 DIAGNOSIS — D0471 Carcinoma in situ of skin of right lower limb, including hip: Secondary | ICD-10-CM | POA: Diagnosis not present

## 2021-09-23 DIAGNOSIS — C44529 Squamous cell carcinoma of skin of other part of trunk: Secondary | ICD-10-CM | POA: Diagnosis not present

## 2021-10-13 DIAGNOSIS — Z800 Family history of malignant neoplasm of digestive organs: Secondary | ICD-10-CM | POA: Diagnosis not present

## 2021-10-13 DIAGNOSIS — K219 Gastro-esophageal reflux disease without esophagitis: Secondary | ICD-10-CM | POA: Diagnosis not present

## 2021-10-13 DIAGNOSIS — R1319 Other dysphagia: Secondary | ICD-10-CM | POA: Diagnosis not present

## 2021-10-16 ENCOUNTER — Ambulatory Visit: Payer: No Typology Code available for payment source | Admitting: Anesthesiology

## 2021-10-16 ENCOUNTER — Encounter
Admission: RE | Disposition: A | Discharge status: Home / Self Care | Payer: Self-pay | Source: Home / Self Care | Attending: Gastroenterology

## 2021-10-16 ENCOUNTER — Other Ambulatory Visit: Payer: Self-pay

## 2021-10-16 ENCOUNTER — Ambulatory Visit
Admission: RE | Admit: 2021-10-16 | Discharge: 2021-10-16 | Disposition: A | Discharge status: Home / Self Care | Payer: No Typology Code available for payment source | Attending: Gastroenterology | Admitting: Gastroenterology

## 2021-10-16 ENCOUNTER — Encounter: Payer: Self-pay | Admitting: *Deleted

## 2021-10-16 DIAGNOSIS — R1310 Dysphagia, unspecified: Secondary | ICD-10-CM | POA: Insufficient documentation

## 2021-10-16 DIAGNOSIS — D131 Benign neoplasm of stomach: Secondary | ICD-10-CM | POA: Diagnosis not present

## 2021-10-16 DIAGNOSIS — K219 Gastro-esophageal reflux disease without esophagitis: Secondary | ICD-10-CM | POA: Diagnosis not present

## 2021-10-16 DIAGNOSIS — E785 Hyperlipidemia, unspecified: Secondary | ICD-10-CM | POA: Diagnosis not present

## 2021-10-16 DIAGNOSIS — R1319 Other dysphagia: Secondary | ICD-10-CM | POA: Diagnosis not present

## 2021-10-16 DIAGNOSIS — R1013 Epigastric pain: Secondary | ICD-10-CM | POA: Diagnosis present

## 2021-10-16 DIAGNOSIS — Z87891 Personal history of nicotine dependence: Secondary | ICD-10-CM | POA: Insufficient documentation

## 2021-10-16 DIAGNOSIS — Z955 Presence of coronary angioplasty implant and graft: Secondary | ICD-10-CM | POA: Diagnosis not present

## 2021-10-16 DIAGNOSIS — K2289 Other specified disease of esophagus: Secondary | ICD-10-CM | POA: Diagnosis not present

## 2021-10-16 DIAGNOSIS — Z1211 Encounter for screening for malignant neoplasm of colon: Secondary | ICD-10-CM | POA: Diagnosis not present

## 2021-10-16 DIAGNOSIS — K317 Polyp of stomach and duodenum: Secondary | ICD-10-CM | POA: Insufficient documentation

## 2021-10-16 DIAGNOSIS — I2510 Atherosclerotic heart disease of native coronary artery without angina pectoris: Secondary | ICD-10-CM | POA: Insufficient documentation

## 2021-10-16 DIAGNOSIS — I252 Old myocardial infarction: Secondary | ICD-10-CM | POA: Diagnosis not present

## 2021-10-16 DIAGNOSIS — Z800 Family history of malignant neoplasm of digestive organs: Secondary | ICD-10-CM | POA: Diagnosis not present

## 2021-10-16 DIAGNOSIS — K2100 Gastro-esophageal reflux disease with esophagitis, without bleeding: Secondary | ICD-10-CM | POA: Diagnosis not present

## 2021-10-16 HISTORY — PX: ESOPHAGOGASTRODUODENOSCOPY (EGD) WITH PROPOFOL: SHX5813

## 2021-10-16 SURGERY — ESOPHAGOGASTRODUODENOSCOPY (EGD) WITH PROPOFOL
Anesthesia: General

## 2021-10-16 MED ORDER — PROPOFOL 10 MG/ML IV BOLUS
INTRAVENOUS | Status: DC | PRN
  Administered 2021-10-16: 60 mg via INTRAVENOUS

## 2021-10-16 MED ORDER — LIDOCAINE HCL (CARDIAC) PF 100 MG/5ML IV SOSY
PREFILLED_SYRINGE | INTRAVENOUS | Status: DC | PRN
  Administered 2021-10-16: 50 mg via INTRAVENOUS

## 2021-10-16 MED ORDER — SODIUM CHLORIDE 0.9 % IV SOLN: INTRAVENOUS | Status: DC

## 2021-10-16 MED ORDER — PROPOFOL 500 MG/50ML IV EMUL
INTRAVENOUS | Status: DC | PRN
  Administered 2021-10-16: 140 ug/kg/min via INTRAVENOUS

## 2021-10-16 NOTE — Anesthesia Procedure Notes (Signed)
Date/Time: 10/16/2021 12:20 PM ?Performed by: Johnna Acosta, CRNA ?Pre-anesthesia Checklist: Patient identified, Emergency Drugs available, Patient being monitored, Suction available and Timeout performed ?Patient Re-evaluated:Patient Re-evaluated prior to induction ?Oxygen Delivery Method: Nasal cannula ?Preoxygenation: Pre-oxygenation with 100% oxygen ?Induction Type: IV induction ? ? ? ? ?

## 2021-10-16 NOTE — Interval H&P Note (Signed)
History and Physical Interval Note: ? ?10/16/2021 ?12:06 PM ? ?Jon Bray  has presented today for surgery, with the diagnosis of R13.19  - Esophageal dysphagia ?Z80.0 - Family history of esophageal cancer.  The various methods of treatment have been discussed with the patient and family. After consideration of risks, benefits and other options for treatment, the patient has consented to  Procedure(s): ?ESOPHAGOGASTRODUODENOSCOPY (EGD) WITH PROPOFOL (N/A) as a surgical intervention.  The patient's history has been reviewed, patient examined, no change in status, stable for surgery.  I have reviewed the patient's chart and labs.  Questions were answered to the patient's satisfaction.   ? ? ?Hilton Cork Psalm Arman ? ?Ok to proceed with EGD ?

## 2021-10-16 NOTE — Anesthesia Preprocedure Evaluation (Signed)
Anesthesia Evaluation  ?Patient identified by MRN, date of birth, ID band ?Patient awake ? ? ? ?Reviewed: ?Allergy & Precautions, NPO status , Patient's Chart, lab work & pertinent test results ? ?Airway ?Mallampati: II ? ?TM Distance: >3 FB ?Neck ROM: full ? ? ? Dental ? ?(+) Teeth Intact ?  ?Pulmonary ?neg pulmonary ROS,  ?  ?Pulmonary exam normal ?breath sounds clear to auscultation ? ? ? ? ? ? Cardiovascular ?Exercise Tolerance: Good ?+ CAD and + Cardiac Stents  ?negative cardio ROS ?Normal cardiovascular exam ?Rhythm:Regular Rate:Normal ? ? ?  ?Neuro/Psych ?negative neurological ROS ? negative psych ROS  ? GI/Hepatic ?negative GI ROS, Neg liver ROS, GERD  ,  ?Endo/Other  ?negative endocrine ROS ? Renal/GU ?negative Renal ROS  ?negative genitourinary ?  ?Musculoskeletal ?negative musculoskeletal ROS ?(+)  ? Abdominal ?Normal abdominal exam  (+)   ?Peds ?negative pediatric ROS ?(+)  Hematology ?negative hematology ROS ?(+)   ?Anesthesia Other Findings ?Past Medical History: ?2023: History of kidney stones ?2014: MI (myocardial infarction) ([place]) ? ?Past Surgical History: ?No date: CARDIAC CATHETERIZATION ?No date: CORONARY ANGIOPLASTY WITH STENT PLACEMENT ?08/06/2021: EXTRACORPOREAL SHOCK WAVE LITHOTRIPSY; Left ?    Comment:  Procedure: EXTRACORPOREAL SHOCK WAVE LITHOTRIPSY (ESWL); ?             Surgeon: Hollice Espy, MD;  Location: [facility] ORS;   ?             Service: Urology;  Laterality: Left; ? ?BMI   ? Body Mass Index: 25.84 kg/m?  ?  ? ? Reproductive/Obstetrics ?negative OB ROS ? ?  ? ? ? ? ? ? ? ? ? ? ? ? ? ?  ?  ? ? ? ? ? ? ? ? ?Anesthesia Physical ?Anesthesia Plan ? ?ASA: 2 ? ?Anesthesia Plan: General  ? ?Post-op Pain Management:   ? ?Induction: Intravenous ? ?PONV Risk Score and Plan: Propofol infusion and TIVA ? ?Airway Management Planned: Natural Airway and Nasal Cannula ? ?Additional Equipment:  ? ?Intra-op Plan:  ? ?Post-operative Plan:  ? ?Informed Consent: I have  reviewed the patients History and Physical, chart, labs and discussed the procedure including the risks, benefits and alternatives for the proposed anesthesia with the patient or authorized representative who has indicated his/her understanding and acceptance.  ? ? ? ?Dental Advisory Given ? ?Plan Discussed with: CRNA and Surgeon ? ?Anesthesia Plan Comments:   ? ? ? ? ? ? ?Anesthesia Quick Evaluation ? ?

## 2021-10-16 NOTE — Op Note (Signed)
[facility] ?Gastroenterology ?Patient Name: Jon Bray ?Procedure Date: 10/16/2021 11:21 AM ?MRN: [id number] ?Account #: [id number] ?Date of Birth: June 24, 1941 ?Admit Type: Outpatient ?Age: 81 ?Room: [facility] ENDO ROOM 3 ?Gender: Male ?Note Status: Finalized ?Instrument Name: Upper Endoscope [id number] ?Procedure:             Upper GI endoscopy ?Indications:           Epigastric abdominal pain, Dysphagia ?Providers:             Andrey Farmer MD, MD ?Medicines:             Monitored Anesthesia Care ?Complications:         No immediate complications. Estimated blood loss:  ?                       Minimal. ?Procedure:             Pre-Anesthesia Assessment: ?                       - Prior to the procedure, a History and Physical was  ?                       performed, and patient medications and allergies were  ?                       reviewed. The patient is competent. The risks and  ?                       benefits of the procedure and the sedation options and  ?                       risks were discussed with the patient. All questions  ?                       were answered and informed consent was obtained.  ?                       Patient identification and proposed procedure were  ?                       verified by the physician, the nurse, the  ?                       anesthesiologist, the anesthetist and the technician  ?                       in the endoscopy suite. Mental Status Examination:  ?                       alert and oriented. Airway Examination: normal  ?                       oropharyngeal airway and neck mobility. Respiratory  ?                       Examination: clear to auscultation. CV Examination:  ?                       normal. Prophylactic Antibiotics: The patient does not  ?  require prophylactic antibiotics. Prior  ?                       Anticoagulants: The patient has taken no previous  ?                       anticoagulant or antiplatelet agents. ASA  Grade  ?                       Assessment: II - A patient with mild systemic disease.  ?                       After reviewing the risks and benefits, the patient  ?                       was deemed in satisfactory condition to undergo the  ?                       procedure. The anesthesia plan was to use monitored  ?                       anesthesia care (MAC). Immediately prior to  ?                       administration of medications, the patient was  ?                       re-assessed for adequacy to receive sedatives. The  ?                       heart rate, respiratory rate, oxygen saturations,  ?                       blood pressure, adequacy of pulmonary ventilation, and  ?                       response to care were monitored throughout the  ?                       procedure. The physical status of the patient was  ?                       re-assessed after the procedure. ?                       After obtaining informed consent, the endoscope was  ?                       passed under direct vision. Throughout the procedure,  ?                       the patient's blood pressure, pulse, and oxygen  ?                       saturations were monitored continuously. The Endoscope  ?                       was introduced through the mouth, and advanced to the  ?  second part of duodenum. The upper GI endoscopy was  ?                       accomplished without difficulty. The patient tolerated  ?                       the procedure well. ?Findings: ?     One tongue of salmon-colored mucosa was present. Biopsies were taken  ?     with a cold forceps for histology. Estimated blood loss was minimal. ?     Normal mucosa was found in the entire esophagus. Biopsies were obtained  ?     from the proximal and distal esophagus with cold forceps for histology  ?     of suspected eosinophilic esophagitis. Estimated blood loss was minimal. ?     Multiple small sessile fundic gland polyps with no bleeding  and no  ?     stigmata of recent bleeding were found in the gastric fundus and in the  ?     gastric body. Biopsies were taken with a cold forceps for histology.  ?     Estimated blood loss was minimal. ?     The exam of the stomach was otherwise normal. ?     The examined duodenum was normal. ?Impression:            - Salmon-colored mucosa. Biopsied. ?                       - Normal mucosa was found in the entire esophagus.  ?                       Biopsied. ?                       - Multiple fundic gland polyps. Biopsied. ?                       - Normal examined duodenum. ?Recommendation:        - Discharge patient to home. ?                       - Resume previous diet. ?                       - Continue present medications. ?                       - Await pathology results. ?                       - Return to referring physician as previously  ?                       scheduled. ?Procedure Code(s):     --- Professional --- ?                       [phone], Esophagogastroduodenoscopy, flexible,  ?                       transoral; with biopsy, single or multiple ?Diagnosis Code(s):     --- Professional --- ?  K22.8, Other specified diseases of esophagus ?                       K31.7, Polyp of stomach and duodenum ?                       R10.13, Epigastric pain ?                       R13.10, Dysphagia, unspecified ?CPT copyright 2019 American Medical Association. All rights reserved. ?The codes documented in this report are preliminary and upon coder review may  ?be revised to meet current compliance requirements. ?Andrey Farmer MD, MD ?10/16/2021 12:37:31 PM ?Number of Addenda: 0 ?Note Initiated On: 10/16/2021 11:21 AM ?Estimated Blood Loss:  Estimated blood loss was minimal. ?     [facility] ?

## 2021-10-16 NOTE — Anesthesia Postprocedure Evaluation (Signed)
Anesthesia Post Note ? ?Patient: Jon Bray ? ?Procedure(s) Performed: ESOPHAGOGASTRODUODENOSCOPY (EGD) WITH PROPOFOL ? ?Patient location during evaluation: PACU ?Anesthesia Type: General ?Level of consciousness: awake and oriented ?Pain management: pain level controlled ?Vital Signs Assessment: post-procedure vital signs reviewed and stable ?Respiratory status: spontaneous breathing and respiratory function stable ?Cardiovascular status: stable ?Anesthetic complications: no ? ? ?No notable events documented. ? ? ?Last Vitals:  ?Vitals:  ? 10/16/21 1244 10/16/21 1254  ?BP: 102/75   ?Pulse:  61  ?Resp:    ?Temp:    ?SpO2:  96%  ?  ?Last Pain:  ?Vitals:  ? 10/16/21 1254  ?TempSrc:   ?PainSc: 0-No pain  ? ? ?  ?  ?  ?  ?  ?  ? ?VAN STAVEREN,Brittay Mogle ? ? ? ? ?

## 2021-10-16 NOTE — H&P (Signed)
Outpatient short stay form Pre-procedure ?10/16/2021  ?Lesly Rubenstein, MD ? ?Primary Physician: Rusty Aus, MD ? ?Reason for visit:  Epigastric pain ? ?History of present illness:   ? ?81 y/o gentleman with history of HLD here for EGD for epigastric pain and family history of esophageal cancer in his father. No significant dysphagia except to breads occasionally. No blood thinners. History of smoking many years ago. ? ? ? ?Current Facility-Administered Medications:  ?  0.9 %  sodium chloride infusion, , Intravenous, Continuous, Lawsyn Heiler, Hilton Cork, MD, Last Rate: 20 mL/hr at 10/16/21 1147, New Bag at 10/16/21 1147 ? ?Medications Prior to Admission  ?Medication Sig Dispense Refill Last Dose  ? aspirin EC 81 MG tablet Take 81 mg by mouth daily at 12 noon.   10/15/2021  ? atorvastatin (LIPITOR) 20 MG tablet Take 20 mg by mouth daily at 12 noon.   10/15/2021  ? ferrous sulfate 325 (65 FE) MG tablet Take 325 mg by mouth daily with breakfast.   10/15/2021  ? Multiple Vitamin (MULTIVITAMIN) tablet Take 1 tablet by mouth daily.   10/15/2021  ? vitamin B-12 (CYANOCOBALAMIN) 1000 MCG tablet Take 1,000 mcg by mouth daily at 12 noon.   10/15/2021  ? nitroGLYCERIN (NITROSTAT) 0.4 MG SL tablet Place under the tongue.     ? ? ? ?No Known Allergies ? ? ?Past Medical History:  ?Diagnosis Date  ? History of kidney stones 2023  ? MI (myocardial infarction) ([place]) 2014  ? ? ?Review of systems:  Otherwise negative.  ? ? ?Physical Exam ? ?Gen: Alert, oriented. Appears stated age.  ?HEENT: PERRLA. ?Lungs: No respiratory distress ?CV: RRR ?Abd: soft, benign, no masses ?Ext: No edema ? ? ? ?Planned procedures: Proceed with EGD. The patient understands the nature of the planned procedure, indications, risks, alternatives and potential complications including but not limited to bleeding, infection, perforation, damage to internal organs and possible oversedation/side effects from anesthesia. The patient agrees and gives consent to proceed.   ?Please refer to procedure notes for findings, recommendations and patient disposition/instructions.  ? ? ? ?Lesly Rubenstein, MD ?[facility] ? ? ? ?  ? ?

## 2021-10-16 NOTE — Transfer of Care (Signed)
Immediate Anesthesia Transfer of Care Note ? ?Patient: Jon Bray ? ?Procedure(s) Performed: ESOPHAGOGASTRODUODENOSCOPY (EGD) WITH PROPOFOL ? ?Patient Location: PACU ? ?Anesthesia Type:General ? ?Level of Consciousness: sedated ? ?Airway & Oxygen Therapy: Patient Spontanous Breathing ? ?Post-op Assessment: Report given to RN and Post -op Vital signs reviewed and stable ? ?Post vital signs: Reviewed and stable ? ?Last Vitals:  ?Vitals Value Taken Time  ?BP 112/79 10/16/21 1235  ?Temp 36.1 ?C 10/16/21 1234  ?Pulse 59 10/16/21 1235  ?Resp 16 10/16/21 1235  ?SpO2 96 % 10/16/21 1235  ? ? ?Last Pain:  ?Vitals:  ? 10/16/21 1234  ?TempSrc: Temporal  ?PainSc: Asleep  ?   ? ?  ? ?Complications: No notable events documented. ?

## 2021-10-19 ENCOUNTER — Encounter: Payer: Self-pay | Admitting: Gastroenterology

## 2021-10-20 LAB — SURGICAL PATHOLOGY

## 2021-10-21 DIAGNOSIS — X32XXXA Exposure to sunlight, initial encounter: Secondary | ICD-10-CM | POA: Diagnosis not present

## 2021-10-21 DIAGNOSIS — L570 Actinic keratosis: Secondary | ICD-10-CM | POA: Diagnosis not present

## 2021-10-21 DIAGNOSIS — T8189XA Other complications of procedures, not elsewhere classified, initial encounter: Secondary | ICD-10-CM | POA: Diagnosis not present

## 2021-11-16 ENCOUNTER — Encounter: Payer: Self-pay | Admitting: Emergency Medicine

## 2021-11-16 ENCOUNTER — Emergency Department: Payer: No Typology Code available for payment source

## 2021-11-16 ENCOUNTER — Emergency Department
Admission: EM | Admit: 2021-11-16 | Discharge: 2021-11-17 | Disposition: A | Discharge status: Home / Self Care | Payer: No Typology Code available for payment source | Attending: Emergency Medicine | Admitting: Emergency Medicine

## 2021-11-16 DIAGNOSIS — K5730 Diverticulosis of large intestine without perforation or abscess without bleeding: Secondary | ICD-10-CM | POA: Diagnosis not present

## 2021-11-16 DIAGNOSIS — K439 Ventral hernia without obstruction or gangrene: Secondary | ICD-10-CM | POA: Diagnosis not present

## 2021-11-16 DIAGNOSIS — K4090 Unilateral inguinal hernia, without obstruction or gangrene, not specified as recurrent: Secondary | ICD-10-CM | POA: Diagnosis not present

## 2021-11-16 DIAGNOSIS — I2510 Atherosclerotic heart disease of native coronary artery without angina pectoris: Secondary | ICD-10-CM | POA: Diagnosis not present

## 2021-11-16 DIAGNOSIS — K4091 Unilateral inguinal hernia, without obstruction or gangrene, recurrent: Secondary | ICD-10-CM | POA: Diagnosis not present

## 2021-11-16 DIAGNOSIS — I480 Paroxysmal atrial fibrillation: Secondary | ICD-10-CM | POA: Diagnosis present

## 2021-11-16 DIAGNOSIS — N3289 Other specified disorders of bladder: Secondary | ICD-10-CM | POA: Diagnosis not present

## 2021-11-16 DIAGNOSIS — I4891 Unspecified atrial fibrillation: Secondary | ICD-10-CM | POA: Insufficient documentation

## 2021-11-16 DIAGNOSIS — R112 Nausea with vomiting, unspecified: Secondary | ICD-10-CM | POA: Diagnosis present

## 2021-11-16 HISTORY — DX: Paroxysmal atrial fibrillation: I48.0

## 2021-11-16 LAB — COMPREHENSIVE METABOLIC PANEL
ALT: 31 U/L (ref 0–44)
AST: 38 U/L (ref 15–41)
Albumin: 4.3 g/dL (ref 3.5–5.0)
Alkaline Phosphatase: 52 U/L (ref 38–126)
Anion gap: 8 (ref 5–15)
BUN: 29 mg/dL — ABNORMAL HIGH (ref 8–23)
CO2: 27 mmol/L (ref 22–32)
Calcium: 9 mg/dL (ref 8.9–10.3)
Chloride: 104 mmol/L (ref 98–111)
Creatinine, Ser: 1.07 mg/dL (ref 0.61–1.24)
GFR, Estimated: >60 mL/min (ref >60)
Glucose, Bld: 136 mg/dL — ABNORMAL HIGH (ref 70–99)
Potassium: 3.8 mmol/L (ref 3.5–5.1)
Sodium: 139 mmol/L (ref 135–145)
Total Bilirubin: 1 mg/dL (ref 0.3–1.2)
Total Protein: 7.7 g/dL (ref 6.5–8.1)

## 2021-11-16 LAB — CBC
HCT: 44 % (ref 39.0–52.0)
Hemoglobin: 13.9 g/dL (ref 13.0–17.0)
MCH: 27.9 pg (ref 26.0–34.0)
MCHC: 31.6 g/dL (ref 30.0–36.0)
MCV: 88.4 fL (ref 80.0–100.0)
Platelets: 197 10*3/uL (ref 150–400)
RBC: 4.98 MIL/uL (ref 4.22–5.81)
RDW: 14.6 % (ref 11.5–15.5)
WBC: 7.7 10*3/uL (ref 4.0–10.5)
nRBC: 0 % (ref 0.0–0.2)

## 2021-11-16 LAB — URINALYSIS, ROUTINE W REFLEX MICROSCOPIC
Bilirubin Urine: NEGATIVE
Glucose, UA: NEGATIVE mg/dL
Hgb urine dipstick: NEGATIVE
Ketones, ur: NEGATIVE mg/dL
Leukocytes,Ua: NEGATIVE
Nitrite: NEGATIVE
Protein, ur: 100 mg/dL — AB
Specific Gravity, Urine: 1.02 (ref 1.005–1.030)
pH: 8 (ref 5.0–8.0)

## 2021-11-16 LAB — LACTIC ACID, PLASMA: Lactic Acid, Venous: 1.4 mmol/L (ref 0.5–1.9)

## 2021-11-16 LAB — LIPASE, BLOOD: Lipase: 44 U/L (ref 11–51)

## 2021-11-16 LAB — TROPONIN I (HIGH SENSITIVITY): Troponin I (High Sensitivity): 13 ng/L (ref <18)

## 2021-11-16 IMAGING — CT CT PELVIS W/ CM
2 of 3 series · 17 of 46 positions shown, 19 images · IV contrast (APPLIED)
Comparison: [DATE]

CLINICAL DATA: Painful right side inguinal hernia.

EXAM:
CT PELVIS WITH CONTRAST
TECHNIQUE: Multidetector CT imaging of the pelvis was performed using the
standard protocol following the bolus administration of intravenous
contrast.

[Series 2: axial st · axial · 0.84mm/px · z∈[-1238,-938]mm · 14 of 70 slices shown, 16 images]
[im 5/70  soft-tissue]
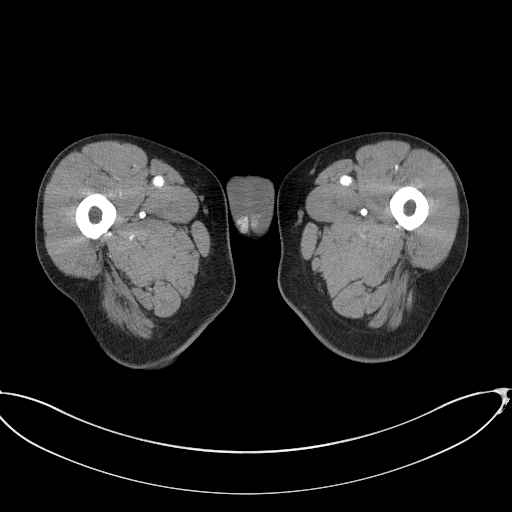
[im 5/70  bone]
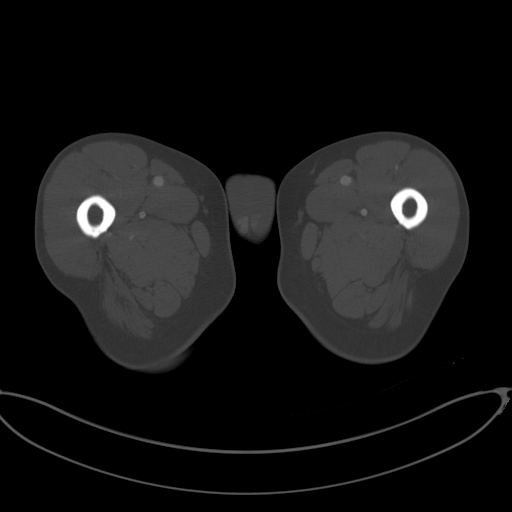
[im 9/70  soft-tissue]
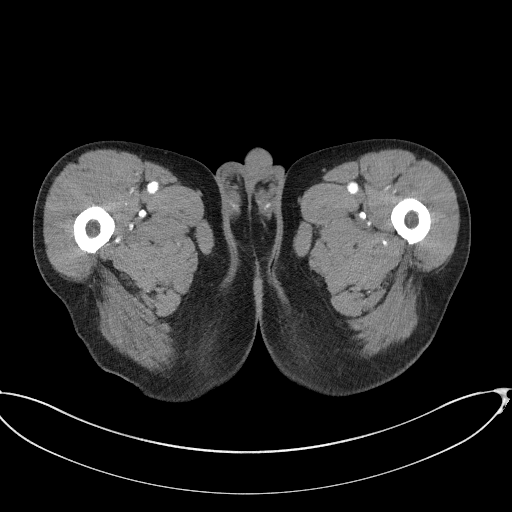
[im 14/70  soft-tissue]
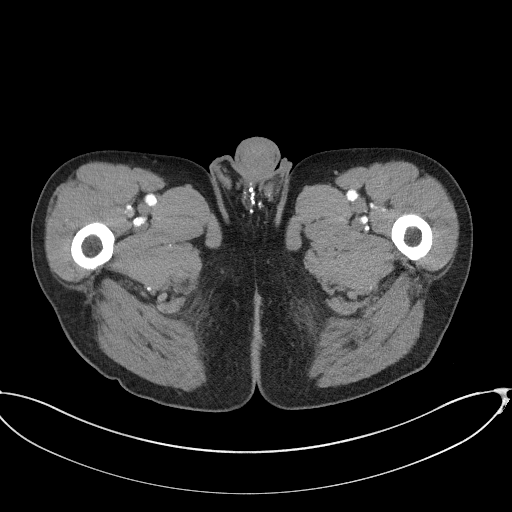
[im 18/70  soft-tissue]
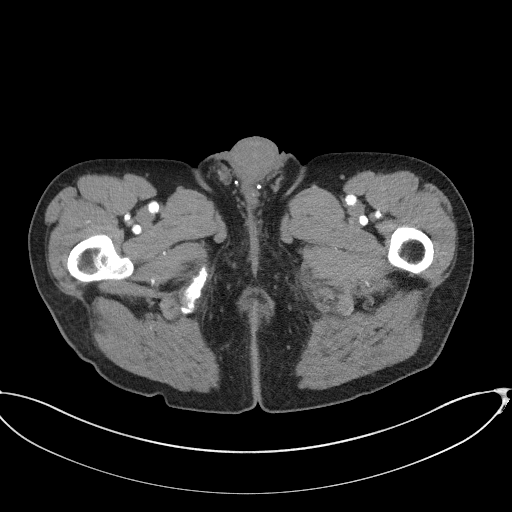
[im 23/70  soft-tissue]
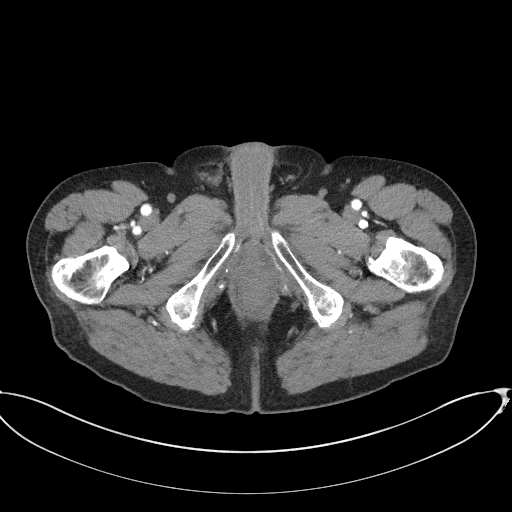
[im 27/70  soft-tissue]
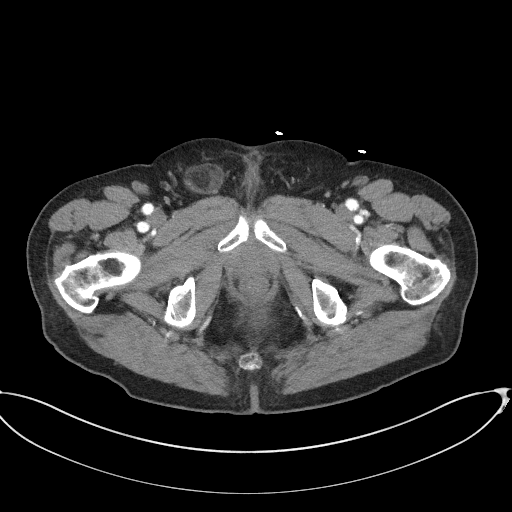
[im 32/70  soft-tissue]
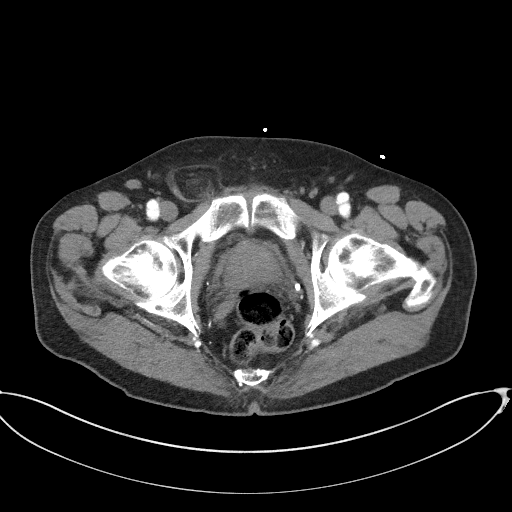
[im 38/70  soft-tissue]
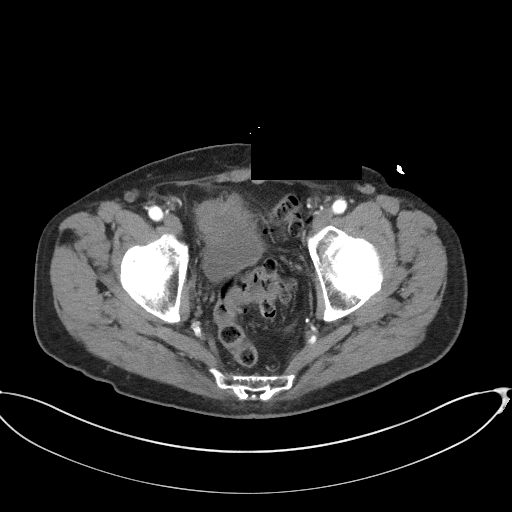
[im 43/70  soft-tissue]
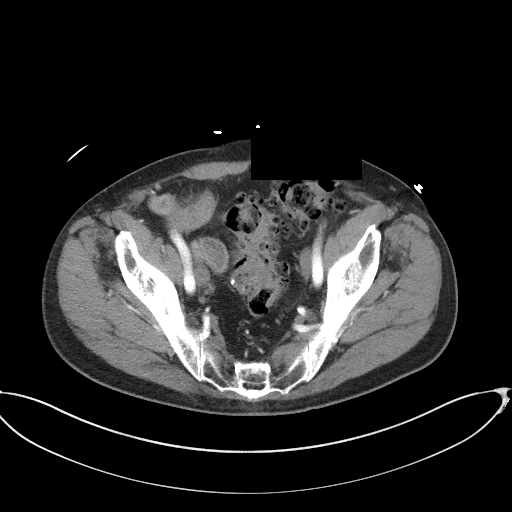
[im 43/70  bone]
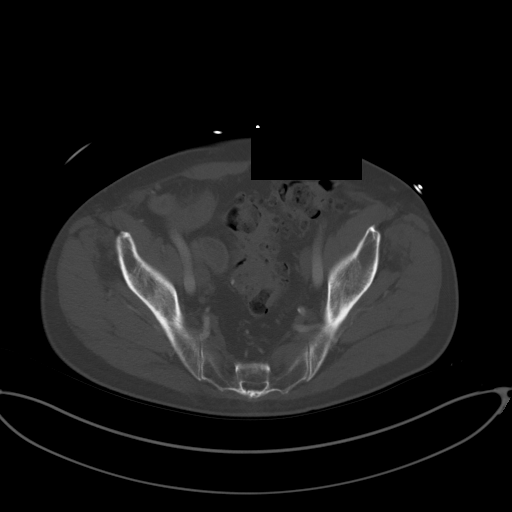
[im 47/70  soft-tissue]
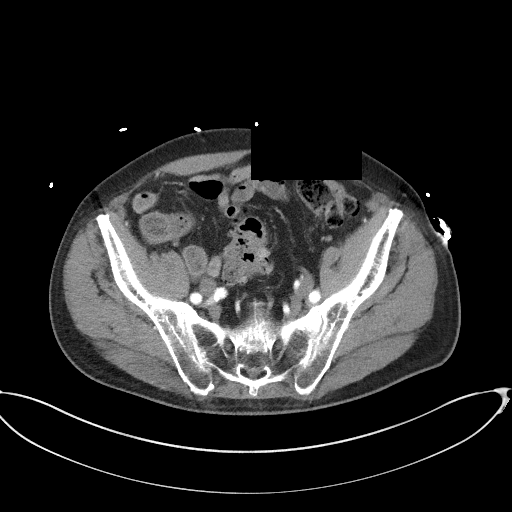
[im 52/70  soft-tissue]
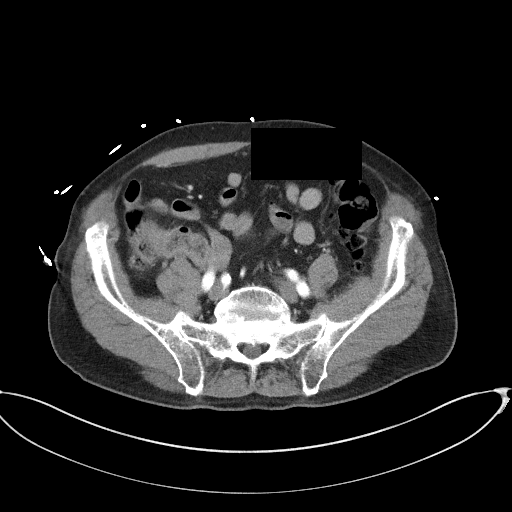
[im 56/70  soft-tissue]
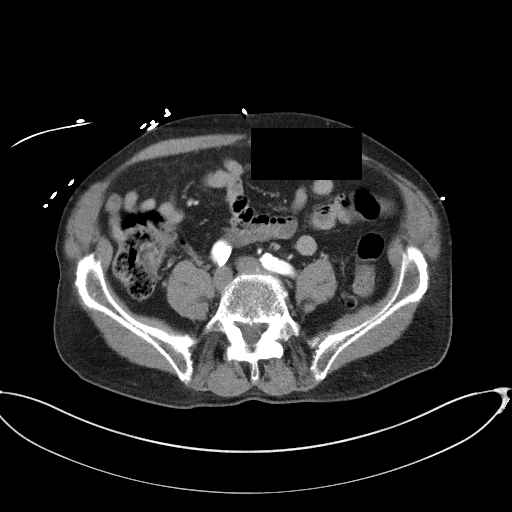
[im 61/70  soft-tissue]
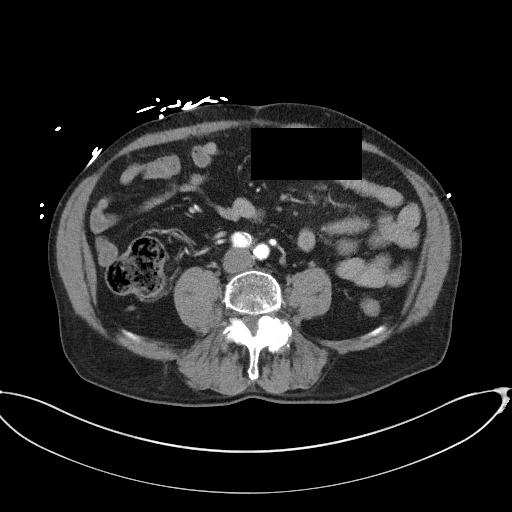
[im 65/70  soft-tissue]
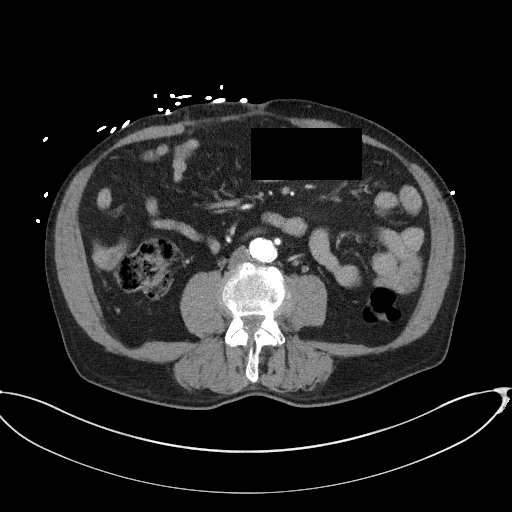

[Series 4: coronal st · coronal · 0.71mm/px · 3 of 88 slices shown]
[im 30/88  soft-tissue]
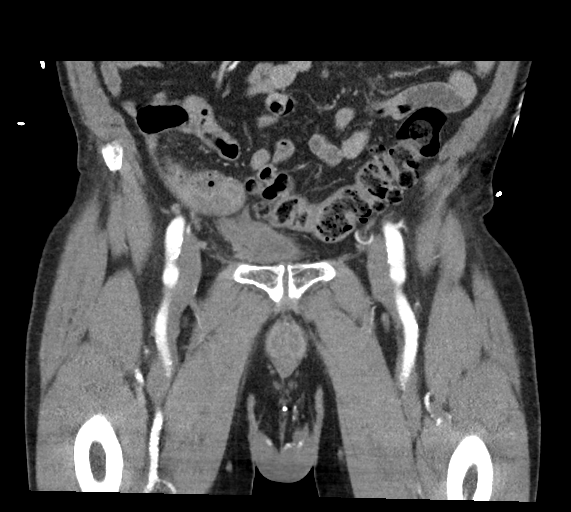
[im 39/88  soft-tissue]
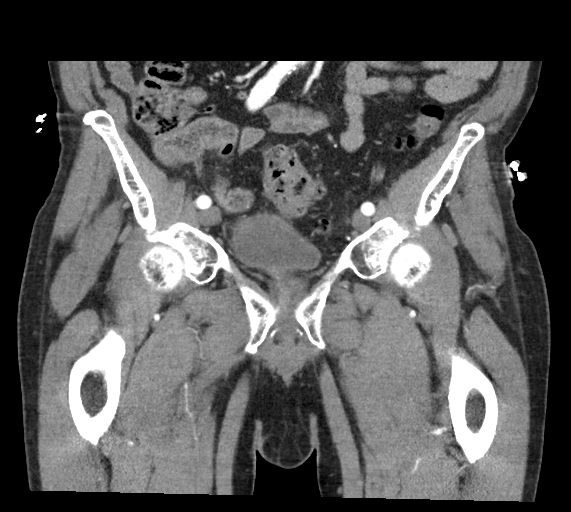
[im 49/88  soft-tissue]
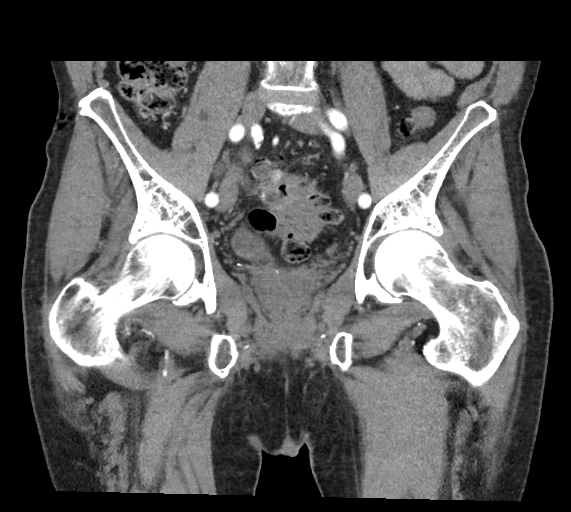

[17 of 46 positions shown; findings below may reference images not displayed]

RADIATION DOSE REDUCTION: This exam was performed according to the
departmental dose-optimization program which includes automated
exposure control, adjustment of the mA and/or kV according to
patient size and/or use of iterative reconstruction technique.

CONTRAST:  100mL OMNIPAQUE IOHEXOL 300 MG/ML  SOLN
FINDINGS: Urinary Tract: Ureters are decompressed. Bladder grossly
unremarkable. A portion of the dome of the bladder is in the neck of
the right inguinal hernia. Amount of bladder herniated is decreased
since prior study.

Bowel: Left lung MOKABA diverticulosis. No active diverticulitis.
Normal appendix. Visualized small bowel grossly unremarkable.

Vascular/Lymphatic: Aortoiliac atherosclerosis. No aneurysm or
adenopathy.

Reproductive:  Mildly prominent prostate

Other: No free fluid or free air. Moderate-sized right inguinal
hernia containing predominantly fat. The dome of the urinary bladder
extends into the neck of the hernia. Overall size not changed since
prior study.

Musculoskeletal: No acute bony abnormality.
IMPRESSION: Moderate-sized right inguinal hernia predominantly containing fat
and the dome of the bladder which extends into the inguinal neck.
The amount of bladder herniated has decreased since prior study.
Overall, no significant change in the size since prior study.

Left colonic diverticulosis.

Aortoiliac atherosclerosis.

## 2021-11-16 MED ORDER — APIXABAN 5 MG PO TABS: 5.0000 mg | ORAL_TABLET | Freq: Two times a day (BID) | ORAL | 0 refills | Status: DC

## 2021-11-16 MED ORDER — OXYCODONE HCL 5 MG PO TABS
5.0000 mg | ORAL_TABLET | Freq: Once | ORAL | Status: AC
  Administered 2021-11-16: 5 mg via ORAL
  Filled 2021-11-16: qty 1

## 2021-11-16 MED ORDER — FENTANYL CITRATE PF 50 MCG/ML IJ SOSY
50.0000 ug | PREFILLED_SYRINGE | Freq: Once | INTRAMUSCULAR | Status: AC
  Administered 2021-11-16: 50 ug via INTRAVENOUS
  Filled 2021-11-16: qty 1

## 2021-11-16 MED ORDER — IOHEXOL 300 MG/ML  SOLN
100.0000 mL | Freq: Once | INTRAMUSCULAR | Status: AC | PRN
  Administered 2021-11-16: 100 mL via INTRAVENOUS

## 2021-11-16 MED ORDER — ONDANSETRON 4 MG PO TBDP
4.0000 mg | ORAL_TABLET | Freq: Once | ORAL | Status: AC
  Administered 2021-11-16: 4 mg via ORAL
  Filled 2021-11-16: qty 1

## 2021-11-16 NOTE — ED Notes (Addendum)
Dr. Tamala Julian at the bedside to give pt update, pt and wife given warm blankets  ?

## 2021-11-16 NOTE — ED Provider Triage Note (Signed)
Emergency Medicine Provider Triage Evaluation Note ? ?ALEK BORGES , a 81 y.o. male  was evaluated in triage.  Pt complains of left groin pain with history of inguinal hernia. He has been able to reduce it in the past, but has not today. He has also had several episodes of nausea and vomiting. No fever. ? ?Review of Systems  ?Positive: Left inguinal pain, nausea, and vomiting ?Negative: Fever ? ?Physical Exam  ?There were no vitals taken for this visit. ?Gen:   Awake, no distress   ?Resp:  Normal effort  ?MSK:   Moves extremities without difficulty  ?Other:   ? ?Medical Decision Making  ?Medically screening exam initiated at 9:40 PM.  Appropriate orders placed.  SOREN LAZARZ was informed that the remainder of the evaluation will be completed by another provider, this initial triage assessment does not replace that evaluation, and the importance of remaining in the ED until their evaluation is complete. ?  ?Victorino Dike, FNP ?11/16/21 2152 ? ?

## 2021-11-16 NOTE — ED Triage Notes (Signed)
Pt presents via POV with complaints of painful right sided inguinal hernia. He was diagnosed with the hernia in Jan 2023 and the pain has gotten worse over the last several days with associated N/V. Pt denies taking any pain meds PTA.  ?

## 2021-11-16 NOTE — ED Provider Notes (Signed)
? ?[facility] ?Provider Note ? ? ? Event Date/Time  ? First MD Initiated Contact with Patient 11/16/21 2153   ?  (approximate) ? ? ?History  ? ?Inguinal Hernia ? ? ?HPI ? ?RIDWAN BONDY is a 81 y.o. male with medical history of CAD, HDL, kidney stones and right previously is a reducible right inguinal hernia who presents for evaluation of he states he stuck out sometimes afternoon.  He since developed some nausea and vomiting.  He has not had any chest pain, cough, shortness of breath, headache, earache, sore throat, diarrhea or any bowel movements since he feels a constant cough.  States that occurred after he played golf.  He denies any left-sided pain or swelling or any other sick symptoms. ? ?  ?Past Medical History:  ?Diagnosis Date  ? History of kidney stones 2023  ? MI (myocardial infarction) [facility]) 2014  ? ? ? ?Physical Exam  ?Triage Vital Signs: ?ED Triage Vitals  ?Enc Vitals Group  ?   BP 11/16/21 2141 (!) 125/92  ?   Pulse Rate 11/16/21 2141 72  ?   Resp 11/16/21 2141 18  ?   Temp 11/16/21 2141 98.5 ?F (36.9 ?C)  ?   Temp Source 11/16/21 2141 Oral  ?   SpO2 11/16/21 2141 98 %  ?   Weight 11/16/21 2142 181 lb 3.5 oz (82.2 kg)  ?   Height 11/16/21 2142 '5\' 9"'$  (1.753 m)  ?   Head Circumference --   ?   Peak Flow --   ?   Pain Score 11/16/21 2142 8  ?   Pain Loc --   ?   Pain Edu? --   ?   Excl. in [place]? --   ? ? ?Most recent vital signs: ?Vitals:  ? 11/16/21 2240 11/16/21 2314  ?BP: 105/70 113/76  ?Pulse:  74  ?Resp: 17 16  ?Temp:  97.7 ?F (36.5 ?C)  ?SpO2: 97% 97%  ? ? ?General: Awake, uncomfortable and dry heaving. ?CV:  Good peripheral perfusion.  2+ radial pulses. ?Resp:  Normal effort.  Clear bilaterally. ?Abd:  No distention.  Soft.  Palpable right inguinal hernia that is tender without overlying skin changes. ?Other:   ? ? ?ED Results / Procedures / Treatments  ?Labs ?(all labs ordered are listed, but only abnormal results are displayed) ?Labs Reviewed  ?COMPREHENSIVE METABOLIC PANEL  - Abnormal; Notable for the following components:  ?    Result Value  ? Glucose, Bld 136 (*)   ? BUN 29 (*)   ? All other components within normal limits  ?LIPASE, BLOOD  ?CBC  ?LACTIC ACID, PLASMA  ?URINALYSIS, ROUTINE W REFLEX MICROSCOPIC  ?LACTIC ACID, PLASMA  ?TROPONIN I (HIGH SENSITIVITY)  ? ? ? ?EKG ? ?ECG is markable for a A-fib with a rate of 68, normal axis with nonspecific ST change in lead II, aVF and lead III without other clear evidence of acute ischemia or significant arrhythmia. ? ?RADIOLOGY ? ?CT pelvis on my review shows a right inguinal hernia with some fat and a small portion of the bladder dome but no evidence of any bowel herniation or any bowel stranding or diverticulitis.  Reviewed radiologist interpretation. ? ? ?PROCEDURES: ? ?Critical Care performed: No ? ?Hernia reduction ? ?Date/Time: 11/16/2021 10:47 PM ?Performed by: Lucrezia Starch, MD ?Authorized by: Lucrezia Starch, MD  ?Consent: Verbal consent obtained. ?Consent given by: patient ?Patient understanding: patient states understanding of the procedure being performed ?Patient identity  confirmed: verbally with patient ?Local anesthesia used: no ? ?Anesthesia: ?Local anesthesia used: no ? ?Sedation: ?Patient sedated: no ? ?Patient tolerance: patient tolerated the procedure well with no immediate complications ? ? ?.1-3 Lead EKG Interpretation ?Performed by: Lucrezia Starch, MD ?Authorized by: Lucrezia Starch, MD  ? ?  Interpretation: non-specific   ?  Rhythm: atrial fibrillation   ?  Ectopy: none   ?  Conduction: normal   ? ?The patient is on the cardiac monitor to evaluate for evidence of arrhythmia and/or significant heart rate changes. ? ? ?MEDICATIONS ORDERED IN ED: ?Medications  ?ondansetron (ZOFRAN-ODT) disintegrating tablet 4 mg (4 mg Oral Given 11/16/21 2151)  ?oxyCODONE (Oxy IR/ROXICODONE) immediate release tablet 5 mg (5 mg Oral Given 11/16/21 2151)  ?fentaNYL (SUBLIMAZE) injection 50 mcg (50 mcg Intravenous Given 11/16/21  2237)  ?iohexol (OMNIPAQUE) 300 MG/ML solution 100 mL (100 mLs Intravenous Contrast Given 11/16/21 2259)  ? ? ? ?IMPRESSION / MDM / ASSESSMENT AND PLAN / ED COURSE  ?I reviewed the triage vital signs and the nursing notes. ?             ?               ? ?Differential diagnosis includes, but is not limited to angulated versus incarcerated right inguinal possibly causing the n.p.o. as well as electrolyte derangements kidney injury and dehydration from patient's vomiting. ? ?CMP shows a significant electrolyte or metabolic derangements.  Lipase not suggestive pancreatitis.  CBC without leukocytosis or acute anemia. ? ?Patient given some fentanyl and I was able to easily reduce it after placing patient in Trendelenburg. ?  ?CT pelvis on my review shows a right inguinal hernia with some fat and a small portion of the bladder dome but no evidence of any bowel herniation or any bowel stranding or diverticulitis.  Reviewed radiologist interpretation. ? ?Reduction CT as above does not show any bowel but a little bit of residual fat little bit of blood on the bladder.  Patient is feeling much better and he no longer has any nausea or vomiting.  Low suspicion for any development of bowel ischemia from this today. ? ?Patient did have an EKG obtained to assess for any evidence of atypical angina given his symptoms on arrival.  There are some nonspecific changes and I will plan to obtain a troponin to assess for any evidence of this.  He is noted to be in A-fib and he has no history of this.  He does not require rate control.  Unclear when he went into A-fib.  His CHA2DS2-VASc 2 score is 3 and I discussed initiating anticoagulation with Eliquis emergency room close outpatient cardiology follow-up.  Discussed risk and benefits of anticoagulation and he is agreeable to this. ? ?Care patient signed over to assuming provider approximately 2330.  Plan is to follow-up troponin and UA and if they are reassuring discharged with plan for  outpatient cardiology and surgery follow-up. ? ? ?FINAL CLINICAL IMPRESSION(S) / ED DIAGNOSES  ? ?Final diagnoses:  ?Right inguinal hernia  ?Atrial fibrillation, unspecified type ([place])  ? ? ? ?Rx / DC Orders  ? ?ED Discharge Orders   ? ?      Ordered  ?  apixaban (ELIQUIS) 5 MG TABS tablet  2 times daily       ? 11/16/21 2325  ? ?  ?  ? ?  ? ? ? ?Note:  This document was prepared using Systems analyst and may include  unintentional dictation errors. ?  ?Lucrezia Starch, MD ?11/16/21 2329 ? ?

## 2021-11-16 NOTE — Discharge Instructions (Addendum)
Please see Dr. Ubaldo Glassing and discuss whether or not you should begin to take a blood thinner.  Return to the ER for recurrent and worsening symptoms, persistent vomiting, difficulty breathing or other concerns. ?

## 2021-11-16 NOTE — ED Notes (Signed)
Pt return from CT, NAD noted, pt alert, wife at the bedside ?

## 2021-11-16 NOTE — ED Notes (Signed)
Pt reports pain has improved, 0/10 abd pain ?

## 2021-11-16 NOTE — ED Notes (Signed)
Pt to CT at this time.

## 2021-11-17 NOTE — ED Provider Notes (Signed)
----------------------------------------- ?  12:01 AM on 11/17/2021 ?-----------------------------------------  ? ?Troponin and UA unremarkable.  Patient may wait to start Eliquis until he sees his cardiologist Dr. Ubaldo Glassing; I think this is reasonable.  Strict return precautions given.  Patient verbalizes understanding and agrees with plan of care. ?  ?Paulette Blanch, MD ?11/17/21 0522 ? ?

## 2021-11-18 DIAGNOSIS — I2510 Atherosclerotic heart disease of native coronary artery without angina pectoris: Secondary | ICD-10-CM | POA: Diagnosis not present

## 2021-11-18 DIAGNOSIS — I480 Paroxysmal atrial fibrillation: Secondary | ICD-10-CM | POA: Diagnosis not present

## 2021-11-18 DIAGNOSIS — K4090 Unilateral inguinal hernia, without obstruction or gangrene, not specified as recurrent: Secondary | ICD-10-CM | POA: Diagnosis not present

## 2021-11-19 DIAGNOSIS — K4090 Unilateral inguinal hernia, without obstruction or gangrene, not specified as recurrent: Secondary | ICD-10-CM | POA: Diagnosis not present

## 2021-11-23 ENCOUNTER — Other Ambulatory Visit: Payer: Self-pay | Admitting: General Surgery

## 2021-11-23 NOTE — Progress Notes (Signed)
Subjective:     Patient ID: Jon Bray is a 81 y.o. male.   HPI   The following portions of the patient's history were reviewed and updated as appropriate.   This a new patient is here today for: office visit. Here for evaluation of a right inguinal hernia referred by Dr Jimmye Norman. He states he has had a right groin knot since about January when doing a kidney stone workup. He states he has some discomfort that comes and goes. He is able to reduce the hernia. Bowels move daily. He was seen in the ED 11-16-21 and CT scan was completed.   Review of Systems  Constitutional: Negative for chills and fever.  Respiratory: Negative for cough.          Chief Complaint  Patient presents with   Hernia      BP (!) 140/80   Pulse 70   Temp 36.5 C (97.7 F)   Ht 170.2 cm (_0 )   Wt 83 kg (183 lb)   SpO2 96%   BMI 28.66 kg/m        Past Medical History:  Diagnosis Date   Chronic kidney disease     Coronary artery disease     Diabetes mellitus type 2, diet-controlled (CMS-HCC) 10/08/2014   Diverticulosis     GERD (gastroesophageal reflux disease) 05/12/2013   Hyperlipidemia, mixed     Kidney stones     MI (myocardial infarction) (CMS-HCC) 2014           Past Surgical History:  Procedure Laterality Date   EGD   03/03/2000   EGD   10/16/2021    Dr Haig Prophet   cardiac catheterization       CATARACT EXTRACTION       COLONOSCOPY   01/31/01; 11/19/08   coronary angioplasty        with stent   Detached retina Right     MAXILLARY SINUSOTOMY INTRANASAL   1960s   SKIN CANCER REMOVED Left      cheek   TONSILLECTOMY AND ADENOIDECTOMY              Social History           Socioeconomic History   Marital status: Married  Tobacco Use   Smoking status: Former      Packs/day: 0.25      Years: 3.00      Pack years: 0.75      Types: Cigarettes      Quit date: 04/28/1962      Years since quitting: 59.6   Smokeless tobacco: Never   Tobacco comments:      smoked very little  as teenager  Vaping Use   Vaping Use: Never used  Substance and Sexual Activity   Alcohol use: Yes      Comment: Occasional yearly golf trip   Drug use: No   Sexual activity: Never  Social History Narrative    Married.        No Known Allergies   Current Medications        Current Outpatient Medications  Medication Sig Dispense Refill   ascorbic acid, vitamin C, (VITAMIN C) 1000 MG tablet Take 1,000 mg by mouth once daily       aspirin 81 MG EC tablet Take 81 mg by mouth daily.       blood glucose diagnostic (FREESTYLE LITE STRIPS) test strip 2 (two) times daily for 30 days Use as instructed. 100 strip 6   blood  glucose meter kit as directed 1 each 0   cyanocobalamin (VITAMIN B12) 1000 MCG tablet Take 1,000 mcg by mouth once daily.       docosahexaenoic acid/epa (FISH OIL ORAL) Take by mouth       ferrous sulfate 325 (65 FE) MG tablet Take 325 mg by mouth daily with breakfast       Lactobacillus acidophilus (PROBIOTIC ORAL) Take by mouth       multivitamin tablet Take by mouth       ZINC ORAL Take by mouth        No current facility-administered medications for this visit.             Family History  Problem Relation Age of Onset   Breast cancer Mother     Esophageal cancer Mother     Coronary Artery Disease (Blocked arteries around heart) Father     Myocardial Infarction (Heart attack) Father     Prostate cancer Father     Esophageal cancer Father     Coronary Artery Disease (Blocked arteries around heart) Brother     Myocardial Infarction (Heart attack) Brother     Coronary Artery Disease (Blocked arteries around heart) Brother     Diabetes type II Maternal Aunt     Stroke Neg Hx     High blood pressure (Hypertension) Neg Hx     Hyperlipidemia (Elevated cholesterol) Neg Hx          Labs and Radiology:    Nov 16, 2021 ED labs and imaging:   Sodium 135 - 145 mmol/L 139   Potassium 3.5 - 5.1 mmol/L 3.8   Chloride 98 - 111 mmol/L 104   CO2 22 - 32 mmol/L 27    Glucose, Bld 70 - 99 mg/dL 136 High    Comment: Glucose reference range applies only to samples taken after fasting for at least 8 hours.  BUN 8 - 23 mg/dL 29 High    Creatinine, Ser 0.61 - 1.24 mg/dL 1.07   Calcium 8.9 - 10.3 mg/dL 9.0   Total Protein 6.5 - 8.1 g/dL 7.7   Albumin 3.5 - 5.0 g/dL 4.3   AST 15 - 41 U/L 38   ALT 0 - 44 U/L 31   Alkaline Phosphatase 38 - 126 U/L 52   Total Bilirubin 0.3 - 1.2 mg/dL 1.0   GFR, Estimated >60 mL/min >60     WBC 4.0 - 10.5 K/uL 7.7   RBC 4.22 - 5.81 MIL/uL 4.98   Hemoglobin 13.0 - 17.0 g/dL 13.9   HCT 39.0 - 52.0 % 44.0   MCV 80.0 - 100.0 fL 88.4   MCH 26.0 - 34.0 pg 27.9   MCHC 30.0 - 36.0 g/dL 31.6   RDW 11.5 - 15.5 % 14.6   Platelets 150 - 400 K/uL 197   nRBC 0.0 - 0.2 % 0.0     Color, Urine YELLOW YELLOW Abnormal    APPearance CLEAR HAZY Abnormal    Specific Gravity, Urine 1.005 - 1.030 1.020   pH 5.0 - 8.0 8.0   Glucose, UA NEGATIVE mg/dL NEGATIVE   Hgb urine dipstick NEGATIVE NEGATIVE   Bilirubin Urine NEGATIVE NEGATIVE   Ketones, ur NEGATIVE mg/dL NEGATIVE   Protein, ur NEGATIVE mg/dL 100 Abnormal    Nitrite NEGATIVE NEGATIVE   Leukocytes,Ua NEGATIVE NEGATIVE   RBC / HPF 0 - 5 RBC/hpf 0-5   WBC, UA 0 - 5 WBC/hpf 0-5   Bacteria, UA NONE SEEN RARE Abnormal  Squamous Epithelial / LPF 0 - 5 0-5   Mucus   PRESENT     IMPRESSION: Moderate-sized right inguinal hernia predominantly containing fat and the dome of the bladder which extends into the inguinal neck. The amount of bladder herniated has decreased since prior study. Overall, no significant change in the size since prior study.   The CT scan was independently reviewed.   The ED physician notes recorded atrial fibrillation, but no ECG is in the ED record.   Prior ECG in January 2023 showed sinus rhythm with first-degree AV block.   Office ECG Nov 18, 2021: Sinus rhythm.   EGD October 16, 2021:   Completed for reports of epigastric gastric abdominal pain and  dysphagia.   Impression:   Salmon-colored mucosa. Biopsied. - Normal mucosa was found in the entire esophagus. Biopsied. - Multiple fundic gland polyps. Biopsied. - Normal examined duodenum. DIAGNOSIS:  A.  ESOPHAGUS; COLD BIOPSY:  - SQUAMOCOLUMNAR MUCOSA WITH MILD CHRONIC INFLAMMATION.  - NEGATIVE FOR GOBLET CELLS, DYSPLASIA, AND MALIGNANCY.   B.  STOMACH POLYP; COLD BIOPSY:  - FUNDIC GLAND POLYP(S), 3 FRAGMENTS.  - NEGATIVE FOR DYSPLASIA AND MALIGNANCY.   C.  ESOPHAGUS, DISTAL; COLD BIOPSY:  - STRATIFIED SQUAMOUS EPITHELIUM WITH MILD REACTIVE CHANGES SUGGESTIVE  OF REFLUX ESOPHAGITIS.  - NEGATIVE FOR INTRAEPITHELIAL EOSINOPHILS AND NEUTROPHILS.  - NEGATIVE FOR DYSPLASIA AND MALIGNANCY.   D.  ESOPHAGUS, PROXIMAL; COLD BIOPSY:  - STRATIFIED SQUAMOUS EPITHELIUM WITHOUT EOSINOPHILS, NEUTROPHILS, OR  REACTIVE CHANGES.  - NEGATIVE FOR DYSPLASIA AND MALIGNANCY.    Objective:   Physical Exam Constitutional:      Appearance: Normal appearance.  Cardiovascular:     Rate and Rhythm: Normal rate and regular rhythm.     Pulses: Normal pulses.          Femoral pulses are 2+ on the right side and 2+ on the left side.    Heart sounds: Normal heart sounds.  Pulmonary:     Effort: Pulmonary effort is normal.     Breath sounds: Normal breath sounds.  Abdominal:     Hernia: A hernia is present. Hernia is present in the right inguinal area (Sizable, but reducible. No inflammation or tenderness. ). There is no hernia in the left inguinal area.  Genitourinary:    Testes: Normal.  Musculoskeletal:     Cervical back: Neck supple.     Right lower leg: No edema.     Left lower leg: No edema.  Lymphadenopathy:     Lower Body: No right inguinal adenopathy. No left inguinal adenopathy.  Skin:    General: Skin is warm and dry.  Neurological:     Mental Status: He is alert and oriented to person, place, and time.  Psychiatric:        Mood and Affect: Mood normal.        Behavior: Behavior  normal.           Assessment:     Right inguinal hernia containing the dome of the bladder.  Symptomatic.    Plan:     Indications for elective repair were reviewed.  Opportunity for referral for robotic repair offered, declined.   Messaged with PCP and received approval to proceed with surgery pending his Holter monitor completion.  With a normal ECG in January and post ED visit, I suspect any changes on the cardiac monitor in the patient's exam room were related to pain, vomiting and dehydration.   We will arrange for bladder scan in the  morning of the procedure to confirm complete emptying otherwise a Foley catheter we placed at the time of the procedure to minimize risk of injury to the bladder.        This note is partially prepared by Karie Fetch, RN, acting as a scribe in the presence of Dr. Hervey Ard, MD.  The documentation recorded by the scribe accurately reflects the service I personally performed and the decisions made by me.    Robert Bellow, MD FACS

## 2021-11-25 ENCOUNTER — Other Ambulatory Visit: Payer: Self-pay

## 2021-11-25 ENCOUNTER — Encounter
Admission: RE | Admit: 2021-11-25 | Discharge: 2021-11-25 | Disposition: A | Discharge status: Home / Self Care | Payer: No Typology Code available for payment source | Source: Ambulatory Visit | Attending: General Surgery | Admitting: General Surgery

## 2021-11-25 NOTE — Patient Instructions (Addendum)
Your procedure is scheduled on:11/27/2021  Report to the Registration Desk on the 1st floor of the [facility]. To find out your arrival time, please call [phone] between 1PM - 3PM on: 11/26/2021  If your arrival time is 6:00 am, do not arrive prior to that time as the [place] entrance doors do not open until 6:00 am.  REMEMBER: Instructions that are not followed completely may result in serious medical risk, up to and including death; or upon the discretion of your surgeon and anesthesiologist your surgery may need to be rescheduled.  Do not eat food after midnight the night before surgery however you can keep drinking clear liquids like water, apple juice, gatorade and coffee (black) . DO NOT put any cream or milk in your coffee. The cut off time of drinking clear liquid is up to 2 HOURS before you are scheduled to arrive. Please follow this cut off time carefully to avoid delaying in surgery.  No gum chewing, lozengers or hard candies to avoid excessive acid production in your stomach.  Follow recommendations from Cardiologist, Pulmonologist or PCP regarding stopping Aspirin and Eliquis in relation to your surgery.These medications can make your blood thin. Based on our conversation, you were not taking Eliquis so no special instructions needed for this but you  need instructions with Aspirin.   One week prior to surgery: Stop Anti-inflammatories (NSAIDS) such as Advil, Aleve, Ibuprofen, Motrin, Naproxen, Naprosyn and Aspirin based products such as Excedrin, Goodys Powder, BC Powder.  Stop ANY OVER THE COUNTER supplements until after surgery like B-12, multivitamin,ferrous sulfate., zinc,fish oil and probiotic. You may however, continue to take Tylenol if needed for pain up until the day of surgery.  No Alcohol for 24 hours before or after surgery.  No Smoking including e-cigarettes for 24 hours prior to surgery.  No chewable tobacco products for at least 6 hours prior to surgery.   No nicotine patches on the day of surgery.  Do not use any "recreational" drugs for at least a week prior to your surgery.  Please be advised that the combination of cocaine and anesthesia may have negative outcomes, up to and including death. If you test positive for cocaine, your surgery will be cancelled.  On the morning of surgery brush your teeth with toothpaste and water, you may rinse your mouth with mouthwash if you wish. Do not swallow any toothpaste or mouthwash.  Use CHG Soap as directed on instruction sheet.  Do not wear jewelry, make-up, hairpins, clips or nail polish.  Do not wear lotions, powders, or perfumes.   Do not shave body from the neck down 48 hours prior to surgery just in case you cut yourself which could leave a site for infection.  Also, freshly shaved skin may become irritated if using the CHG soap.  Contact lenses, hearing aids and dentures may not be worn into surgery.  Do not bring valuables to the hospital. [facility] is not responsible for any missing/lost belongings or valuables.   Notify your doctor if there is any change in your medical condition (cold, fever, infection).  Wear comfortable clothing (specific to your surgery type) to the hospital.  After surgery, you can help prevent lung complications by doing breathing exercises.  Take deep breaths and cough every 1-2 hours. Your doctor may order a device called an Incentive Spirometer to help you take deep breaths. When coughing or sneezing, hold a pillow firmly against your incision with both hands. This is called "splinting." Doing  this helps protect your incision. It also decreases belly discomfort.  If you are being admitted to the hospital overnight, leave your suitcase in the car. After surgery it may be brought to your room.  If you are being discharged the day of surgery, you will not be allowed to drive home. You will need a responsible adult (18 years or older) to drive you home and  stay with you that night.   If you are taking public transportation, you will need to have a responsible adult (18 years or older) with you. Please confirm with your physician that it is acceptable to use public transportation.   Please call the [facility] Dept. at [phone] if you have any questions about these instructions.  Surgery Visitation Policy:  Patients undergoing a surgery or procedure may have two family members or support persons with them as long as the person is not COVID-19 positive or experiencing its symptoms.

## 2021-11-26 ENCOUNTER — Encounter: Payer: Self-pay | Admitting: General Surgery

## 2021-11-26 DIAGNOSIS — L538 Other specified erythematous conditions: Secondary | ICD-10-CM | POA: Diagnosis not present

## 2021-11-26 DIAGNOSIS — L820 Inflamed seborrheic keratosis: Secondary | ICD-10-CM | POA: Diagnosis not present

## 2021-11-26 MED ORDER — CHLORHEXIDINE GLUCONATE CLOTH 2 % EX PADS
6.0000 | MEDICATED_PAD | Freq: Once | CUTANEOUS | Status: AC
  Administered 2021-11-27: 6 via TOPICAL

## 2021-11-26 MED ORDER — CEFAZOLIN SODIUM-DEXTROSE 2-4 GM/100ML-% IV SOLN
2.0000 g | INTRAVENOUS | Status: AC
  Administered 2021-11-27: 2 g via INTRAVENOUS

## 2021-11-26 MED ORDER — LACTATED RINGERS IV SOLN: INTRAVENOUS | Status: DC

## 2021-11-26 MED ORDER — CHLORHEXIDINE GLUCONATE 0.12 % MT SOLN: 15.0000 mL | Freq: Once | OROMUCOSAL | Status: AC

## 2021-11-26 MED ORDER — ORAL CARE MOUTH RINSE: 15.0000 mL | Freq: Once | OROMUCOSAL | Status: AC

## 2021-11-26 MED ORDER — CHLORHEXIDINE GLUCONATE CLOTH 2 % EX PADS: 6.0000 | MEDICATED_PAD | Freq: Once | CUTANEOUS | Status: DC

## 2021-11-26 MED ORDER — FAMOTIDINE 20 MG PO TABS: 20.0000 mg | ORAL_TABLET | Freq: Once | ORAL | Status: DC

## 2021-11-26 NOTE — Progress Notes (Signed)
Perioperative Services  Pre-Admission/Anesthesia Testing Clinical Review  Date: 11/26/21  Patient Demographics:  Name: Jon Bray DOB:   Nov 27, 1940 MRN:   [id number]  Planned Surgical Procedure(s):    Case: [id number] Date/Time: 11/27/21 1308   Procedure: HERNIA REPAIR INGUINAL ADULT (Right)   Anesthesia type: Choice   Pre-op diagnosis: Right inguinal hernia   Location: [facility] OR ROOM 09 / [place] [facility] FOR ANEST[facility]   Surgeons: Robert Bellow, MD   NOTE: Available PAT nursing documentation and vital signs have been reviewed. Clinical nursing staff has updated patient's PMH/PSHx, current medication list, and drug allergies/intolerances to ensure comprehensive history available to assist in medical decision making as it pertains to the aforementioned surgical procedure and anticipated anesthetic course. Extensive review of available clinical information performed. [place] [facility] and PSHx updated with any diagnoses/procedures that  may have been inadvertently omitted during his intake with the pre-admission testing department's nursing staff.  Clinical Discussion:  Jon Bray is a 81 y.o. male who is submitted for pre-surgical anesthesia review and clearance prior to him undergoing the above procedure. Patient has never been a smoker. Pertinent PMH includes: CAD, STEMI, NSVT, PAF, angina, aortic atherosclerosis, aortic root enlargement, HLD, T2DM, CKD, GERD (no daily Tx), RIGHT inguinal hernia.  Patient formally followed by cardiology Ubaldo Glassing, MD); last office visit was on 12/01/2017. Since that time, patient has been managed exclusively by his primary care provider Sabra Heck, MD).  Past medical history significant for cardiovascular diagnoses.  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  Diagnostic left heart catheterization performed revealed multivessel CAD; 20% proximal to mid LAD, 20% D2, 40% L PDA, 30% LVAD, and 99% distal LCx.  PCI was performed placing a 4.5 x 20  mm and 4.5 x 12 mm Veriflex BMS to the LPAV extending to the distal LCx lesion yielding an excellent angiographic result and TIMI-3 flow.  Of note, during patient's admission for STEMI and PCI, he experienced a 24 beat run of NSVT while in the ICU.  Myocardial perfusion imaging study performed on 12/06/2016 revealing a moderately reduced left ventricular systolic function with an EF of 44%.  There was no evidence of reversible stress-induced myocardial ischemia or arrhythmia.  Coronary CTA was performed on 12/27/2017 revealing severe coronary artery calcification.  There was mild dilatation of the aortic root measuring 3.9 cm.  Calcium score was 1578, which places patient in the 82nd percentile for age and sex matched control.  Previously placed stents noted to be widely patent.  FFR analysis performed (see values below) which suggested that LAD stenosis was not hemodynamically significant.  Nondominant RCA = 0.99 LAD: Proximal = 0.97, mid = 0.98, distal = 0.89 Dominant LCx: Proximal = 0.98, mid = 0.97, distal = 0.97, PLB = 0.91, and PDA 0.80 (most distal segment)  Patient recently seen in the ED for issues related to an inguinal hernia. EKG was subsequently performed and patient found to be in new onset atrial fibrillation.  Patient was started on standard dose apixaban at time of discharge from the ED.  Patient was seen by his PCP, at which time he requested a referral to Dr. Rockey Situ (cardiology).  Based on age, risk factors, and past medical history his CHA2DS2-VASc Score = 4 (age x 2, STEMI/aortic plaque, T2DM). Patient was noted to be in NSR while in PCP's office, therefore patient was not started on any interventions to control his rate/rhythm. Anticoagulation (apixaban) was placed on hold. Blood pressure was well controlled without the use of  pharmacological intervention.  Patient is on a statin for his HLD diagnosis and ASCVD prevention. PCP noted that patient is very physically active and  aggressive with his exercise.  He has never had any tachyarrhythmias, nor has he experienced any angina/anginal equivalent symptoms. Patient has been seen by general surgery Bary Castilla, MD) to discuss definitive intervention for his hernia. Surgeon feels as if atrial arrhythmia noted in the ED was related to pain, vomiting, and dehydration. Decision was made by cardiology to perform a 72-hour event monitor study and TTE to confirm atrial fibrillation diagnosis prior to referral to cardiology. Holter study results pending. TTE to be scheduled.   Jon Bray is scheduled for an elective RIGHT INGUINAL HERNIA REPAIR  on 11/27/2021 with Dr. Hervey Ard, MD. Given patient's past medical history, and recent symptoms, and pending testing result presurgical cardiac clearance was sought from patient's PCP by the PAT team. Per internal medicine, "significant right inguinal hernia. He is seeing surgeon tomorrow. LOW surgical risk. Possible A.fib. Really not sure that it is actually present. EKG today shows normal sinus rhythm. Will obtain 72-hour Holter study and echocardiogram. Can get to cardiology if needed".  As previously mentioned, apixaban was never started. In review of his medication reconciliation, patient is on daily antiplatelet therapy, which per Dr. Bary Castilla, patient may continue throughout his perioperative course.   Patient denies previous perioperative complications with anesthesia in the past. In review of the available records, it is noted that patient underwent a general anesthetic course here at [facility] ([facility]) in 10/2021 without documented complications.      11/17/2021   12:00 AM 11/16/2021   11:14 PM 11/16/2021   10:40 PM  Vitals with BMI  Systolic [phone]  Diastolic 70 76 70  Pulse 74 74     Providers/Specialists:   NOTE: Primary physician provider listed below. Patient may have been seen by APP or partner within same practice.   PROVIDER  ROLE / SPECIALTY LAST OV  Bary Castilla Forest Gleason, MD General Surgery (Surgeon) 11/19/2021  Rusty Aus, MD Primary Care Provider 11/18/2021  Bartholome Bill, MD Cardiology 12/01/2017   Allergies:  Patient has no known allergies.  Current Home Medications:   No current facility-administered medications for this encounter.    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   Basal cell carcinoma of skin    a.) s/p Mohs procedure R/L nasal ala 01/27/2015   CAD (coronary artery disease) 05/12/2013   a.) STEMI --> LHC 05/12/2013: 20% p-mLAD, 20% D2, 40% LPDA, 30% LPAV, 99% dLCx --> PCI placing 4.5 x 20 mm and 2.5 x 12 mm Veriflex BMS to LPAV extending to dLCx. b.) cCTA 12/27/2017: Ca score 1578; 82nd percentile for age/sex match control; CT-FFR: RCA 0.99, pLAD 0.97, mLAD 0.98, dLAD 0.89, pLCx 0.98, mLCx 0.97, dLCx 0.97,  PLB 0.91, PDA (most distal segment) 0.80.   Cataract    a.) s/p extraction with IOL placement   CKD (chronic kidney disease)    Diverticulosis    GERD (gastroesophageal reflux disease)    History of kidney stones 2023   HLD (hyperlipidemia)    Left lower lobe pulmonary nodule 08/01/2021   a.) CT 08/01/2021: measured 4 mm; perifissural   Long term current use of anticoagulant    a.) apixaban   NSVT (nonsustained ventricular tachycardia) ([place]) 05/13/2013   a.) 24 beat run while in ICU following PCI for STEMI    PAF (paroxysmal atrial fibrillation) ([place]) 11/16/2021   a.) new onset in ED on 11/16/2021. b.) CHA2DS2-VASc = 4 (age x 2, STEMI/aortic plaque, T2DM). c.) rate controlled without pharmacological intervention; started on apixaban 11/16/2021, however PCP discontinued on 11/18/2021 pending Holter study results.   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   Type 2 diabetes, diet controlled ([place])    Past Surgical History:  Procedure Laterality Date   CATARACT EXTRACTION W/ INTRAOCULAR LENS IMPLANT Bilateral    COLONOSCOPY N/A 01/31/2001   COLONOSCOPY N/A 11/19/2008   CORONARY ANGIOPLASTY WITH STENT PLACEMENT Left 05/12/2013   Procedure: CORONARY ANGIOPLASTY WITH STENT PLACEMENT; Location: [facility]; Surgeon: Reynolds Bowl, MD   ESOPHAGOGASTRODUODENOSCOPY N/A 03/03/2000   ESOPHAGOGASTRODUODENOSCOPY (EGD) WITH PROPOFOL N/A 10/16/2021   Procedure: ESOPHAGOGASTRODUODENOSCOPY (EGD) WITH PROPOFOL;  Surgeon: Lesly Rubenstein, MD;  Location: [facility] ENDOSCOPY;  Service: Endoscopy;  Laterality: N/A;   EXTRACORPOREAL SHOCK WAVE LITHOTRIPSY Left 08/06/2021   Procedure: EXTRACORPOREAL SHOCK WAVE LITHOTRIPSY (ESWL);  Surgeon: Hollice Espy, MD;  Location: [facility] ORS;  Service: Urology;  Laterality: Left;   INTRAOCULAR LENS EXCHANGE Left 12/27/2018   Procedure: MECHANICAL VITRECTOMY,  EXCHANGE LENS PROSTHESIS VIA PARS PLANA APPROACH; Location: [facility]; Surgeon: Isaias Sakai, MD   MOHS SURGERY Bilateral 01/27/2015   Procedure: MOHS SURGERY TO REMOVE BASAL CELL CARCINOMA FROM RIGHT/LEFT NASAL ALA; Location: [facility]; Surgeon: Ishmael Holter, MD   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   Procedure: RPR RETINAL DTCHMNT W/VITRECTOMY ANY METH REPAIR RETINAL DETACHMENT; WITH VITRECTOMY, INC, WHEN PERFORMED,  AIR/GAS TAMPONADE, FOCAL ENDOLASER PHOTOCOAGULATION, CRYOTHERAPY, DRAINAGE OF SUBRETINAL FLUID, SCLERAL BUCKLING AND/OR REMOVAL OF LENS; Location: [facility]; Surgeon: Isaias Sakai, MD   SINUSOTOMY N/A    Procedure: MAXILLARY SINUSOTOMY INTRANASAL   TONSILLECTOMY AND ADENOIDECTOMY N/A    No family history on file. Social History   Tobacco Use   Smoking status: Never   Smokeless tobacco: Never  Vaping Use   Vaping Use: Never used  Substance Use Topics   Alcohol use: Never   Drug use: Never    Pertinent Clinical Results:  LABS: Labs reviewed: Acceptable for surgery.  Component Date Value Ref Range Status   Lipase 11/16/2021 44  11 - 51 U/L Final   Performed at [facility], [place], [place] [address]   Sodium 11/16/2021 139  135 - 145 mmol/L Final   Potassium 11/16/2021 3.8  3.5 - 5.1 mmol/L Final   Chloride 11/16/2021 104  98 - 111 mmol/L Final   CO2 11/16/2021 27  22 - 32 mmol/L Final   Glucose, Bld 11/16/2021 136 (H)  70 - 99 mg/dL Final   Glucose reference range applies only to  samples taken after fasting for at least 8 hours.   BUN 11/16/2021 29 (H)  8 - 23 mg/dL Final   Creatinine, Ser 11/16/2021 1.07  0.61 - 1.24 mg/dL Final   Calcium 11/16/2021 9.0  8.9 - 10.3 mg/dL Final   Total Protein 11/16/2021 7.7  6.5 - 8.1 g/dL Final   Albumin 11/16/2021 4.3  3.5 - 5.0 g/dL Final   AST 11/16/2021 38  15 - 41 U/L Final   ALT 11/16/2021 31  0 - 44 U/L Final   Alkaline Phosphatase 11/16/2021 52  38 - 126 U/L Final   Total Bilirubin 11/16/2021 1.0  0.3 - 1.2 mg/dL Final   GFR, Estimated 11/16/2021 >60  >60 mL/min Final   Comment: (NOTE) Calculated using the CKD-EPI Creatinine Equation (2021)    Anion gap 11/16/2021 8  5 - 15 Final   Performed at [facility], [place], [place] [address]   WBC 11/16/2021 7.7  4.0 - 10.5 K/uL Final   RBC 11/16/2021 4.98  4.22 - 5.81 MIL/uL Final   Hemoglobin 11/16/2021 13.9  13.0 - 17.0 g/dL Final   HCT  11/16/2021 44.0  39.0 - 52.0 % Final   MCV 11/16/2021 88.4  80.0 - 100.0 fL Final   MCH 11/16/2021 27.9  26.0 - 34.0 pg Final   MCHC 11/16/2021 31.6  30.0 - 36.0 g/dL Final   RDW 11/16/2021 14.6  11.5 - 15.5 % Final   Platelets 11/16/2021 197  150 - 400 K/uL Final   nRBC 11/16/2021 0.0  0.0 - 0.2 % Final   Performed at [facility], [place], [place] [address]   Color, Urine 11/16/2021 YELLOW (A)  YELLOW Final   APPearance 11/16/2021 HAZY (A)  CLEAR Final   Specific Gravity, Urine 11/16/2021 1.020  1.005 - 1.030 Final   pH 11/16/2021 8.0  5.0 - 8.0 Final   Glucose, UA 11/16/2021 NEGATIVE  NEGATIVE mg/dL Final   Hgb urine dipstick 11/16/2021 NEGATIVE  NEGATIVE Final   Bilirubin Urine 11/16/2021 NEGATIVE  NEGATIVE Final   Ketones, ur 11/16/2021 NEGATIVE  NEGATIVE mg/dL Final   Protein, ur 11/16/2021 100 (A)  NEGATIVE mg/dL Final   Nitrite 11/16/2021 NEGATIVE  NEGATIVE Final   Leukocytes,Ua 11/16/2021 NEGATIVE  NEGATIVE Final   RBC / HPF 11/16/2021 0-5  0 - 5 RBC/hpf Final   WBC, UA 11/16/2021 0-5  0 - 5 WBC/hpf Final   Bacteria, UA 11/16/2021 RARE (A)  NONE SEEN Final   Squamous Epithelial / LPF 11/16/2021 0-5  0 - 5 Final   Mucus 11/16/2021 PRESENT   Final   Performed at [facility], [address] [place], [place] [address]   Lactic Acid, Venous 11/16/2021 1.4  0.5 - 1.9 mmol/L Final   Performed at [facility], [place], [place] [address]   Troponin I (High Sensitivity) 11/16/2021 13  <18 ng/L Final   Comment: (NOTE) Elevated high sensitivity troponin I (hsTnI) values and significant changes across serial measurements may suggest ACS but many other chronic and acute conditions are known to elevate hsTnI results. Refer to the "Links" section for chest pain algorithms and additional  guidance. Performed at [facility], [address] [place], [place] [address]    ECG: Date: 11/16/2021 Time ECG obtained: 2229  PM Rate: 68 bpm Rhythm: atrial fibrillation Axis (leads I and aVF): Normal Intervals: QRS 88 ms. QTc 422 ms. ST segment and T wave changes: Inferolateral TWIs (  II, III, aVF, V5, V6). Minima anterior STE (V1-V4) Comparison: Acute changes when compared to previous tracing on 08/01/2021 that showed SR with 1st degree AV block at a rate of 63 bpm. Inferolateral TWIs were presents on this tracing as well.    IMAGING / PROCEDURES: CT PELVIS W CONTRAST performed on 11/16/2021 Moderate-sized right inguinal hernia predominantly containing fat and the dome of the bladder which extends into the inguinal neck. The amount of bladder herniated has decreased since prior study. Overall, no significant change in the size since prior study. Left colonic diverticulosis. Aortoiliac atherosclerosis.  CT CORONARY MORPH W/CTA COR W/SCORE W/CA W/CM &/OR WO/CM AND FRACTIONAL FLOW RESERVE FLUID ANALYSIS performed on 12/27/2017 No suspicious pulmonary nodules. No focal consolidation. Mild dependent atelectasis in the bilateral lower lobes. No pleural effusion or pneumothorax. No suspicious mediastinal or axillary lymphadenopathy. Visualized upper abdomen is grossly unremarkable. No focal osseous lesions. No significant extracardiac findings. Mild aortic root enlargement 3.9 cm Severe coronary artery calcification (even excluding stented circumflex territory) score 1578 which is 82nd percentile for age and sex matched control.  Widely patent stent in the mid to distal left dominant circumflex terminating just before OM3 Moderate appearing proximal and mid LAD disease. Study will be sent for FFR CT although they have not performed this on patients with previous stenting in past and there is some misregistration artifact in mid LAD that will likely preclude analysis FFR-CT analysis: Nondominant RCA = 0.99 LAD: Proximal = 0.97, mid = 0.98, distal = 0.89 Dominant LCx: Proximal = 0.98, mid = 0.97, distal = 0.97, PLB =  0.91, and PDA 0.80 (most distal segment)  MYOCARDIAL PERFUSION IMAGING STUDY (LEXISCAN) performed on 12/06/2016 Moderately reduced left ventricular systolic function with an EF of 44% Normal myocardial thickening and wall motion no artifact Left ventricular cavity size normal No evidence of significant reversible myocardial ischemia or arrhythmia  Impression and Plan:  Jon Bray has been referred for pre-anesthesia review and clearance prior to him undergoing the planned anesthetic and procedural courses. Available labs, pertinent testing, and imaging results were personally reviewed by me. This patient has been appropriately cleared by internal medicine with an overall LOW risk of significant perioperative cardiovascular complications.  Based on clinical review performed today (11/26/21), barring any significant acute changes in the patient's overall condition, it is anticipated that he will be able to proceed with the planned surgical intervention. Any acute changes in clinical condition may necessitate his procedure being postponed and/or cancelled. Patient will meet with anesthesia team (MD and/or CRNA) on the day of his procedure for preoperative evaluation/assessment. Questions regarding anesthetic course will be fielded at that time.   Pre-surgical instructions were reviewed with the patient during his PAT appointment and questions were fielded by PAT clinical staff. Patient was advised that if any questions or concerns arise prior to his procedure then he should return a call to PAT and/or his surgeon's office to discuss.  Honor Loh, MSN, APRN, FNP-C, CEN [facility]  Peri-operative Services Nurse Practitioner Phone: [phone] Fax: [phone] 11/26/21 10:59 AM  NOTE: This note has been prepared using Dragon dictation software. Despite my best ability to proofread, there is always the potential that unintentional transcriptional errors may still occur from  this process.

## 2021-11-27 ENCOUNTER — Encounter
Admission: RE | Disposition: A | Discharge status: Home / Self Care | Payer: Self-pay | Source: Home / Self Care | Attending: General Surgery

## 2021-11-27 ENCOUNTER — Ambulatory Visit: Payer: No Typology Code available for payment source | Admitting: Urgent Care

## 2021-11-27 ENCOUNTER — Other Ambulatory Visit: Payer: Self-pay

## 2021-11-27 ENCOUNTER — Encounter: Payer: Self-pay | Admitting: General Surgery

## 2021-11-27 ENCOUNTER — Ambulatory Visit
Admission: RE | Admit: 2021-11-27 | Discharge: 2021-11-27 | Disposition: A | Discharge status: Home / Self Care | Payer: No Typology Code available for payment source | Attending: General Surgery | Admitting: General Surgery

## 2021-11-27 DIAGNOSIS — I480 Paroxysmal atrial fibrillation: Secondary | ICD-10-CM | POA: Insufficient documentation

## 2021-11-27 DIAGNOSIS — I252 Old myocardial infarction: Secondary | ICD-10-CM | POA: Diagnosis not present

## 2021-11-27 DIAGNOSIS — K4090 Unilateral inguinal hernia, without obstruction or gangrene, not specified as recurrent: Secondary | ICD-10-CM | POA: Diagnosis not present

## 2021-11-27 DIAGNOSIS — E1151 Type 2 diabetes mellitus with diabetic peripheral angiopathy without gangrene: Secondary | ICD-10-CM | POA: Diagnosis not present

## 2021-11-27 DIAGNOSIS — E1122 Type 2 diabetes mellitus with diabetic chronic kidney disease: Secondary | ICD-10-CM | POA: Insufficient documentation

## 2021-11-27 DIAGNOSIS — I700 Atherosclerosis of aorta: Secondary | ICD-10-CM | POA: Insufficient documentation

## 2021-11-27 DIAGNOSIS — N189 Chronic kidney disease, unspecified: Secondary | ICD-10-CM | POA: Diagnosis not present

## 2021-11-27 DIAGNOSIS — K219 Gastro-esophageal reflux disease without esophagitis: Secondary | ICD-10-CM | POA: Diagnosis not present

## 2021-11-27 DIAGNOSIS — I2510 Atherosclerotic heart disease of native coronary artery without angina pectoris: Secondary | ICD-10-CM | POA: Insufficient documentation

## 2021-11-27 HISTORY — DX: Long term (current) use of anticoagulants: Z79.01

## 2021-11-27 HISTORY — DX: Unspecified cataract: H26.9

## 2021-11-27 HISTORY — DX: Gastro-esophageal reflux disease without esophagitis: K21.9

## 2021-11-27 HISTORY — DX: Hyperlipidemia, unspecified: E78.5

## 2021-11-27 HISTORY — PX: INGUINAL HERNIA REPAIR: SHX194

## 2021-11-27 HISTORY — DX: Type 2 diabetes mellitus without complications: E11.9

## 2021-11-27 HISTORY — DX: Diverticulosis of intestine, part unspecified, without perforation or abscess without bleeding: K57.90

## 2021-11-27 HISTORY — DX: Unilateral inguinal hernia, without obstruction or gangrene, not specified as recurrent: K40.90

## 2021-11-27 HISTORY — DX: Angina pectoris, unspecified: I20.9

## 2021-11-27 HISTORY — DX: Atherosclerosis of aorta: I70.0

## 2021-11-27 HISTORY — DX: Chronic kidney disease, unspecified: N18.9

## 2021-11-27 HISTORY — DX: Basal cell carcinoma of skin, unspecified: C44.91

## 2021-11-27 SURGERY — REPAIR, HERNIA, INGUINAL, ADULT
Anesthesia: General | Site: Groin | Laterality: Right

## 2021-11-27 MED ORDER — BUPIVACAINE-EPINEPHRINE (PF) 0.5% -1:200000 IJ SOLN
INTRAMUSCULAR | Status: AC
  Filled 2021-11-27: qty 30

## 2021-11-27 MED ORDER — PROPOFOL 10 MG/ML IV BOLUS
INTRAVENOUS | Status: DC | PRN
  Administered 2021-11-27: 100 mg via INTRAVENOUS

## 2021-11-27 MED ORDER — ONDANSETRON HCL 4 MG/2ML IJ SOLN
INTRAMUSCULAR | Status: DC | PRN
  Administered 2021-11-27: 4 mg via INTRAVENOUS

## 2021-11-27 MED ORDER — 0.9 % SODIUM CHLORIDE (POUR BTL) OPTIME
TOPICAL | Status: DC | PRN
  Administered 2021-11-27: 500 mL

## 2021-11-27 MED ORDER — CEFAZOLIN SODIUM-DEXTROSE 2-4 GM/100ML-% IV SOLN
INTRAVENOUS | Status: AC
  Filled 2021-11-27: qty 100

## 2021-11-27 MED ORDER — FAMOTIDINE 20 MG PO TABS
ORAL_TABLET | ORAL | Status: AC
  Filled 2021-11-27: qty 1

## 2021-11-27 MED ORDER — ACETAMINOPHEN 10 MG/ML IV SOLN
INTRAVENOUS | Status: DC | PRN
  Administered 2021-11-27: 1000 mg via INTRAVENOUS

## 2021-11-27 MED ORDER — CHLORHEXIDINE GLUCONATE 0.12 % MT SOLN
OROMUCOSAL | Status: AC
  Administered 2021-11-27: 15 mL via OROMUCOSAL
  Filled 2021-11-27: qty 15

## 2021-11-27 MED ORDER — ACETAMINOPHEN 10 MG/ML IV SOLN
INTRAVENOUS | Status: AC
  Filled 2021-11-27: qty 100

## 2021-11-27 MED ORDER — FENTANYL CITRATE (PF) 100 MCG/2ML IJ SOLN: 25.0000 ug | INTRAMUSCULAR | Status: DC | PRN

## 2021-11-27 MED ORDER — PHENYLEPHRINE 80 MCG/ML (10ML) SYRINGE FOR IV PUSH (FOR BLOOD PRESSURE SUPPORT)
PREFILLED_SYRINGE | INTRAVENOUS | Status: DC | PRN
  Administered 2021-11-27 (×3): 80 ug via INTRAVENOUS

## 2021-11-27 MED ORDER — ONDANSETRON HCL 4 MG/2ML IJ SOLN: 4.0000 mg | Freq: Once | INTRAMUSCULAR | Status: DC | PRN

## 2021-11-27 MED ORDER — HYDROCODONE-ACETAMINOPHEN 5-325 MG PO TABS: 1.0000 | ORAL_TABLET | ORAL | 0 refills | Status: DC | PRN

## 2021-11-27 MED ORDER — LIDOCAINE HCL (CARDIAC) PF 100 MG/5ML IV SOSY
PREFILLED_SYRINGE | INTRAVENOUS | Status: DC | PRN
  Administered 2021-11-27: 60 mg via INTRAVENOUS

## 2021-11-27 MED ORDER — BUPIVACAINE-EPINEPHRINE (PF) 0.5% -1:200000 IJ SOLN
INTRAMUSCULAR | Status: DC | PRN
  Administered 2021-11-27: 30 mL

## 2021-11-27 MED ORDER — EPHEDRINE SULFATE (PRESSORS) 50 MG/ML IJ SOLN
INTRAMUSCULAR | Status: DC | PRN
  Administered 2021-11-27 (×5): 5 mg via INTRAVENOUS

## 2021-11-27 MED ORDER — KETOROLAC TROMETHAMINE 30 MG/ML IJ SOLN
INTRAMUSCULAR | Status: AC
  Filled 2021-11-27: qty 2

## 2021-11-27 MED ORDER — PROPOFOL 10 MG/ML IV BOLUS
INTRAVENOUS | Status: AC
  Filled 2021-11-27: qty 20

## 2021-11-27 MED ORDER — KETOROLAC TROMETHAMINE 30 MG/ML IJ SOLN
INTRAMUSCULAR | Status: DC | PRN
  Administered 2021-11-27: 30 mg via INTRAMUSCULAR

## 2021-11-27 MED ORDER — FENTANYL CITRATE (PF) 100 MCG/2ML IJ SOLN
INTRAMUSCULAR | Status: DC | PRN
  Administered 2021-11-27 (×4): 25 ug via INTRAVENOUS

## 2021-11-27 MED ORDER — FENTANYL CITRATE (PF) 100 MCG/2ML IJ SOLN
INTRAMUSCULAR | Status: AC
  Filled 2021-11-27: qty 2

## 2021-11-27 MED ORDER — DEXAMETHASONE SODIUM PHOSPHATE 10 MG/ML IJ SOLN
INTRAMUSCULAR | Status: DC | PRN
  Administered 2021-11-27: 10 mg via INTRAVENOUS

## 2021-11-27 SURGICAL SUPPLY — 36 items
APL PRP STRL LF DISP 70% ISPRP (MISCELLANEOUS) ×1
BLADE SURG 15 STRL SS SAFETY (BLADE) ×4 IMPLANT
CHLORAPREP W/TINT 26 (MISCELLANEOUS) ×2 IMPLANT
DRAIN PENROSE 12X.25 LTX STRL (MISCELLANEOUS) ×2 IMPLANT
DRAPE LAPAROTOMY 100X77 ABD (DRAPES) ×2 IMPLANT
DRSG TEGADERM 4X4.75 (GAUZE/BANDAGES/DRESSINGS) ×2 IMPLANT
DRSG TELFA 4X3 1S NADH ST (GAUZE/BANDAGES/DRESSINGS) ×2 IMPLANT
ELECT REM PT RETURN 9FT ADLT (ELECTROSURGICAL) ×2
ELECTRODE REM PT RTRN 9FT ADLT (ELECTROSURGICAL) ×1 IMPLANT
GAUZE 4X4 16PLY ~~LOC~~+RFID DBL (SPONGE) ×2 IMPLANT
GLOVE BIO SURGEON STRL SZ7.5 (GLOVE) ×2 IMPLANT
GLOVE SURG UNDER LTX SZ8 (GLOVE) ×2 IMPLANT
GOWN STRL REUS W/ TWL LRG LVL3 (GOWN DISPOSABLE) ×2 IMPLANT
GOWN STRL REUS W/TWL LRG LVL3 (GOWN DISPOSABLE) ×4
KIT TURNOVER KIT A (KITS) ×2 IMPLANT
LABEL OR SOLS (LABEL) ×2 IMPLANT
MANIFOLD NEPTUNE II (INSTRUMENTS) ×2 IMPLANT
MESH HERNIA 6X12 ULTRAPRO MED (Mesh General) IMPLANT
MESH HERNIA ULTRAPRO MED (Mesh General) ×1 IMPLANT
NEEDLE HYPO 22GX1.5 SAFETY (NEEDLE) ×4 IMPLANT
PACK BASIN MINOR ARMC (MISCELLANEOUS) ×2 IMPLANT
SPIKE FLUID TRANSFER (MISCELLANEOUS) ×2 IMPLANT
STRIP CLOSURE SKIN 1/2X4 (GAUZE/BANDAGES/DRESSINGS) ×2 IMPLANT
SUT PDS AB 0 CT1 27 (SUTURE) IMPLANT
SUT SURGILON 0 BLK (SUTURE) ×2 IMPLANT
SUT VIC AB 2-0 SH 27 (SUTURE) ×2
SUT VIC AB 2-0 SH 27XBRD (SUTURE) ×1 IMPLANT
SUT VIC AB 3-0 54X BRD REEL (SUTURE) ×1 IMPLANT
SUT VIC AB 3-0 BRD 54 (SUTURE) ×2
SUT VIC AB 3-0 SH 27 (SUTURE) ×2
SUT VIC AB 3-0 SH 27X BRD (SUTURE) ×1 IMPLANT
SUT VIC AB 4-0 FS2 27 (SUTURE) ×2 IMPLANT
SWABSTK COMLB BENZOIN TINCTURE (MISCELLANEOUS) ×2 IMPLANT
SYR 10ML LL (SYRINGE) ×2 IMPLANT
SYR 3ML LL SCALE MARK (SYRINGE) ×2 IMPLANT
WATER STERILE IRR 500ML POUR (IV SOLUTION) ×2 IMPLANT

## 2021-11-27 NOTE — OR Nursing (Signed)
Per Dr. Bary Castilla, verbal, he does not need to see pt in postop prior to discharge.

## 2021-11-27 NOTE — Anesthesia Procedure Notes (Signed)
Procedure Name: LMA Insertion Date/Time: 11/27/2021 1:51 PM Performed by: Lily Peer, Kirsta Probert, CRNA Pre-anesthesia Checklist: Patient identified, Emergency Drugs available, Suction available and Patient being monitored Patient Re-evaluated:Patient Re-evaluated prior to induction Oxygen Delivery Method: Circle system utilized Preoxygenation: Pre-oxygenation with 100% oxygen Induction Type: IV induction Ventilation: Mask ventilation without difficulty LMA: LMA inserted LMA Size: 4.0 Number of attempts: 1 Placement Confirmation: positive ETCO2 and breath sounds checked- equal and bilateral Tube secured with: Tape Dental Injury: Teeth and Oropharynx as per pre-operative assessment

## 2021-11-27 NOTE — Transfer of Care (Signed)
Immediate Anesthesia Transfer of Care Note  Patient: Jon Bray  Procedure(s) Performed: HERNIA REPAIR INGUINAL ADULT (Right: Groin)  Patient Location: PACU  Anesthesia Type:General  Level of Consciousness: drowsy  Airway & Oxygen Therapy: Patient Spontanous Breathing and Patient connected to face mask oxygen  Post-op Assessment: Report given to RN and Post -op Vital signs reviewed and stable  Post vital signs: Reviewed and stable  Last Vitals:  Vitals Value Taken Time  BP 113/60 11/27/21 1442  Temp 36.3 C 11/27/21 1442  Pulse 72 11/27/21 1444  Resp 15 11/27/21 1444  SpO2 99 % 11/27/21 1444  Vitals shown include unvalidated device data.  Last Pain:  Vitals:   11/27/21 1244  TempSrc: Temporal  PainSc: 0-No pain         Complications: No notable events documented.

## 2021-11-27 NOTE — H&P (Signed)
Jon Bray [id number] [birth year]     HPI: Active 81 year old male who developed a significant right inguinal hernia, recent trip to the ED for reduction.  Admitted for elective repair.  Imaging suggests the dome of the bladder was in the hernia.  Asymptomatic since his office visit last week.  Medications Prior to Admission  Medication Sig Dispense Refill Last Dose   aspirin 81 MG chewable tablet Chew 81 mg by mouth daily.   11/26/2021   bisacodyl (DULCOLAX) 5 MG EC tablet Take 5 mg by mouth daily as needed for moderate constipation.   11/25/2021   ferrous sulfate 325 (65 FE) MG tablet Take 325 mg by mouth daily with breakfast.   11/25/2021   Lactobacillus (PROBIOTIC ACIDOPHILUS PO) Take by mouth.   11/25/2021   Multiple Vitamin (MULTIVITAMIN) tablet Take 1 tablet by mouth daily.   11/25/2021   Omega-3 Fatty Acids (FISH OIL) 1000 MG CAPS Take 1 capsule by mouth.   11/25/2021   vitamin B-12 (CYANOCOBALAMIN) 1000 MCG tablet Take 1,000 mcg by mouth daily at 12 noon.   11/25/2021   Zinc Sulfate (ZINC-220 PO) Take by mouth.   11/25/2021   apixaban (ELIQUIS) 5 MG TABS tablet Take 1 tablet (5 mg total) by mouth 2 (two) times daily. (Patient not taking: Reported on 11/25/2021) 60 tablet 0    atorvastatin (LIPITOR) 20 MG tablet Take 20 mg by mouth daily at 12 noon. (Patient not taking: Reported on 11/25/2021)      nitroGLYCERIN (NITROSTAT) 0.4 MG SL tablet Place under the tongue. 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   Basal cell carcinoma of skin    a.) s/p Mohs procedure R/L nasal ala 01/27/2015   CAD (coronary artery disease) 05/12/2013   a.) STEMI --> LHC 05/12/2013: 20% p-mLAD, 20% D2, 40% LPDA, 30% LPAV, 99% dLCx --> PCI placing 4.5 x 20 mm and 2.5 x 12 mm Veriflex BMS to LPAV extending to dLCx.  b.) cCTA 12/27/2017: Ca score 1578; 82nd percentile for age/sex match control; CT-FFR: RCA 0.99, pLAD 0.97, mLAD 0.98, dLAD 0.89, pLCx 0.98, mLCx 0.97, dLCx 0.97, PLB 0.91, PDA (most distal segment) 0.80.   Cataract    a.) s/p extraction with IOL placement   CKD (chronic kidney disease)    Diverticulosis    GERD (gastroesophageal reflux disease)    History of kidney stones 2023   HLD (hyperlipidemia)    Left lower lobe pulmonary nodule 08/01/2021   a.) CT 08/01/2021: measured 4 mm; perifissural   Long term current use of anticoagulant    a.) apixaban   NSVT (nonsustained ventricular tachycardia) ([place]) 05/13/2013   a.) 24 beat run while in ICU following PCI for STEMI   PAF (paroxysmal atrial fibrillation) ([place]) 11/16/2021   a.) new onset in ED on 11/16/2021. b.) CHA2DS2-VASc = 4 (age x 2, STEMI/aortic plaque, T2DM). c.) rate controlled without pharmacological intervention; started on apixaban 11/16/2021, however PCP discontinued on 11/18/2021 pending Holter study results.   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  Type 2 diabetes, diet controlled ([place])    Past Surgical History:  Procedure Laterality Date   CATARACT EXTRACTION W/ INTRAOCULAR LENS IMPLANT Bilateral    COLONOSCOPY N/A 01/31/2001   COLONOSCOPY N/A 11/19/2008   CORONARY ANGIOPLASTY WITH STENT PLACEMENT Left 05/12/2013   Procedure: CORONARY ANGIOPLASTY WITH STENT PLACEMENT; Location: [facility]; Surgeon: Reynolds Bowl, MD   ESOPHAGOGASTRODUODENOSCOPY N/A 03/03/2000   ESOPHAGOGASTRODUODENOSCOPY (EGD) WITH PROPOFOL N/A 10/16/2021   Procedure: ESOPHAGOGASTRODUODENOSCOPY (EGD) WITH PROPOFOL;  Surgeon: Lesly Rubenstein, MD;  Location: [facility] END[facility];  Service: Endoscopy;  Laterality: N/A;   EXTRACORPOREAL SHOCK  WAVE LITHOTRIPSY Left 08/06/2021   Procedure: EXTRACORPOREAL SHOCK WAVE LITHOTRIPSY (ESWL);  Surgeon: Hollice Espy, MD;  Location: [facility] ORS;  Service: Urology;  Laterality: Left;   INTRAOCULAR LENS EXCHANGE Left 12/27/2018   Procedure: MECHANICAL VITRECTOMY,  EXCHANGE LENS PROSTHESIS VIA PARS PLANA APPROACH; Location: [facility]; Surgeon: Isaias Sakai, MD   MOHS SURGERY Bilateral 01/27/2015   Procedure: MOHS SURGERY TO REMOVE BASAL CELL CARCINOMA FROM RIGHT/LEFT NASAL ALA; Location: [facility]; Surgeon: Ishmael Holter, MD   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   Procedure: RPR RETINAL DTCHMNT W/VITRECTOMY ANY METH REPAIR RETINAL DETACHMENT; WITH VITRECTOMY, INC, WHEN PERFORMED, AIR/GAS TAMPONADE, FOCAL ENDOLASER PHOTOCOAGULATION, CRYOTHERAPY, DRAINAGE OF SUBRETINAL FLUID, SCLERAL BUCKLING AND/OR REMOVAL OF LENS; Location: [facility]; Surgeon: Isaias Sakai, MD   SINUSOTOMY N/A    Procedure: MAXILLARY SINUSOTOMY INTRANASAL   TONSILLECTOMY AND ADENOIDECTOMY N/A    Social History   Socioeconomic History   Marital status: Married    Spouse name: Not on file   Number of children: Not on file   Years of education: Not on file   Highest education level: Not on file  Occupational History   Not on file  Tobacco Use   Smoking status: Never   Smokeless tobacco: Never  Vaping Use   Vaping Use: Never used  Substance and Sexual Activity   Alcohol use: Never   Drug use: Never   Sexual activity: Yes  Other Topics Concern   Not on file  Social History Narrative   Lives at home with wife.   Social Determinants of Health   Financial Resource Strain: Not on file  Food Insecurity: Not on file  Transportation Needs: Not on file  Physical Activity: Not on file  Stress: Not on file  Social Connections: Not on file  Intimate Partner Violence: Not on file   Social History   Social History Narrative   Lives at home with wife.     ROS: Negative.     PE: HEENT: Negative. Lungs: Clear. Cardio:  RR. Abdomen: Reducible right inguinal hernia.   Assessment/Plan:  Proceed with planned right inguinal hernia repair. [name] 11/27/2021

## 2021-11-27 NOTE — Discharge Instructions (Addendum)
AMBULATORY SURGERY  ?DISCHARGE INSTRUCTIONS ? ? ?The drugs that you were given will stay in your system until tomorrow so for the next 24 hours you should not: ? ?Drive an automobile ?Make any legal decisions ?Drink any alcoholic beverage ? ? ?You may resume regular meals tomorrow.  Today it is better to start with liquids and gradually work up to solid foods. ? ?You may eat anything you prefer, but it is better to start with liquids, then soup and crackers, and gradually work up to solid foods. ? ? ?Please notify your doctor immediately if you have any unusual bleeding, trouble breathing, redness and pain at the surgery site, drainage, fever, or pain not relieved by medication. ? ? ? ?Additional Instructions: ? ? ? ?Please contact your physician with any problems or Same Day Surgery at [phone], Monday through Friday 6 am to 4 pm, or [place] [facility] [place] Main number at [phone].  ?

## 2021-11-27 NOTE — Anesthesia Postprocedure Evaluation (Signed)
Anesthesia Post Note  Patient: Jon Bray  Procedure(s) Performed: HERNIA REPAIR INGUINAL ADULT (Right: Groin)  Patient location during evaluation: PACU Anesthesia Type: General Level of consciousness: awake Pain management: satisfactory to patient Vital Signs Assessment: post-procedure vital signs reviewed and stable Respiratory status: spontaneous breathing and respiratory function stable Cardiovascular status: stable Anesthetic complications: no   No notable events documented.   Last Vitals:  Vitals:   11/27/21 1244 11/27/21 1442  BP: (!) 145/89 113/60  Pulse: 67 73  Resp: 18 15  Temp: 36.8 C (!) 36.3 C  SpO2: 99% 98%    Last Pain:  Vitals:   11/27/21 1244  TempSrc: Temporal  PainSc: 0-No pain                 VAN STAVEREN,Kelsee Preslar

## 2021-11-27 NOTE — Anesthesia Preprocedure Evaluation (Signed)
Anesthesia Evaluation  Patient identified by MRN, date of birth, ID band Patient awake    Reviewed: Allergy & Precautions, NPO status , Patient's Chart, lab work & pertinent test results  Airway Mallampati: II  TM Distance: >3 FB Neck ROM: Full    Dental  (+) Teeth Intact, Partial Lower   Pulmonary neg pulmonary ROS,    Pulmonary exam normal breath sounds clear to auscultation       Cardiovascular Exercise Tolerance: Good + CAD, + Past MI and + Peripheral Vascular Disease  negative cardio ROS Normal cardiovascular exam+ dysrhythmias Atrial Fibrillation  Rhythm:Regular     Neuro/Psych negative neurological ROS  negative psych ROS   GI/Hepatic negative GI ROS, Neg liver ROS, GERD  ,  Endo/Other  negative endocrine ROSdiabetes, Well Controlled  Renal/GU      Musculoskeletal negative musculoskeletal ROS (+)   Abdominal Normal abdominal exam  (+)   Peds negative pediatric ROS (+)  Hematology negative hematology ROS (+)   Anesthesia Other Findings Past Medical History: No date: Anginal pain (HCC) No date: Aortic atherosclerosis ([place]) 12/27/2017: Aortic root enlargement (HCC)     Comment:  a.) cCTA 12/27/2017: measured 3.9 cm. No date: Basal cell carcinoma of skin     Comment:  a.) s/p Mohs procedure R/L nasal ala 01/27/2015 05/12/2013: CAD (coronary artery disease)     Comment:  a.) STEMI --> LHC 05/12/2013: 20% p-mLAD, 20% D2, 40%               LPDA, 30% LPAV, 99% dLCx --> PCI placing 4.5 x 20 mm and               2.5 x 12 mm Veriflex BMS to LPAV extending to dLCx. b.)               cCTA 12/27/2017: Ca score 1578; 82nd percentile for               age/sex match control; CT-FFR: RCA 0.99, pLAD 0.97, mLAD               0.98, dLAD 0.89, pLCx 0.98, mLCx 0.97, dLCx 0.97, PLB               0.91, PDA (most distal segment) 0.80. No date: Cataract     Comment:  a.) s/p extraction with IOL placement No date: CKD (chronic  kidney disease) No date: Diverticulosis No date: GERD (gastroesophageal reflux disease) 2023: History of kidney stones No date: HLD (hyperlipidemia) 08/01/2021: Left lower lobe pulmonary nodule     Comment:  a.) CT 08/01/2021: measured 4 mm; perifissural No date: Long term current use of anticoagulant     Comment:  a.) apixaban 05/13/2013: NSVT (nonsustained ventricular tachycardia) (HCC)     Comment:  a.) 24 beat run while in ICU following PCI for STEMI 11/16/2021: PAF (paroxysmal atrial fibrillation) (HCC)     Comment:  a.) new onset in ED on 11/16/2021. b.) CHA2DS2-VASc = 4               (age x 2, STEMI/aortic plaque, T2DM). c.) rate controlled              without pharmacological intervention; started on apixaban              11/16/2021, however PCP discontinued on 11/18/2021               pending Holter study results. No date: Right inguinal hernia 05/12/2013: ST elevation myocardial infarction (STEMI) of  inferoposterior wall [facility])     Comment:  a.) LHC 05/12/2013: 20% p-mLAD, 20% D2, 40% LPDA, 30%               LPAV, 99% dLCx --> PCI placing 4.5 x 20 mm and 4.5 x 12               mm Veriflex BMS to LPAV extending to dLCx. No date: Type 2 diabetes, diet controlled ([place])  Past Surgical History: No date: CATARACT EXTRACTION W/ INTRAOCULAR LENS IMPLANT; Bilateral 01/31/2001: COLONOSCOPY; N/A 11/19/2008: COLONOSCOPY; N/A 05/12/2013: CORONARY ANGIOPLASTY WITH STENT PLACEMENT; Left     Comment:  Procedure: CORONARY ANGIOPLASTY WITH STENT PLACEMENT;               Location: [facility]; Surgeon: Reynolds Bowl, MD 03/03/2000: ESOPHAGOGASTRODUODENOSCOPY; N/A 10/16/2021: ESOPHAGOGASTRODUODENOSCOPY (EGD) WITH PROPOFOL; N/A     Comment:  Procedure: ESOPHAGOGASTRODUODENOSCOPY (EGD) WITH               PROPOFOL;  Surgeon: Lesly Rubenstein, MD;  Location:               [facility] END[facility];  Service: Endoscopy;  Laterality: N/A; 08/06/2021: EXTRACORPOREAL SHOCK WAVE LITHOTRIPSY; Left     Comment:   Procedure: EXTRACORPOREAL SHOCK WAVE LITHOTRIPSY (ESWL);              Surgeon: Hollice Espy, MD;  Location: [facility];                Service: Urology;  Laterality: Left; 12/27/2018: INTRAOCULAR LENS EXCHANGE; Left     Comment:  Procedure: MECHANICAL VITRECTOMY,  EXCHANGE LENS               PROSTHESIS VIA PARS PLANA APPROACH; Location: [facility];               Surgeon: Isaias Sakai, MD 01/27/2015: MOHS SURGERY; Bilateral     Comment:  Procedure: MOHS SURGERY TO REMOVE BASAL CELL CARCINOMA               FROM RIGHT/LEFT NASAL ALA; Location: [facility]; Surgeon:               Ishmael Holter, MD 01/13/2019: RETINAL DETACHMENT SURGERY; Left     Comment:  Procedure: RPR RETINAL DTCHMNT W/VITRECTOMY ANY METH               REPAIR RETINAL DETACHMENT; WITH VITRECTOMY, INC, WHEN               PERFORMED, AIR/GAS TAMPONADE, FOCAL ENDOLASER               PHOTOCOAGULATION, CRYOTHERAPY, DRAINAGE OF SUBRETINAL               FLUID, SCLERAL BUCKLING AND/OR REMOVAL OF LENS; Location:              [facility]; Surgeon: Isaias Sakai, MD No date: SINUSOTOMY; N/A     Comment:  Procedure: MAXILLARY SINUSOTOMY INTRANASAL No date: TONSILLECTOMY AND ADENOIDECTOMY; N/A  BMI    Body Mass Index: 26.76 kg/m      Reproductive/Obstetrics negative OB ROS                             Anesthesia Physical Anesthesia Plan  ASA: 3  Anesthesia Plan: General   Post-op Pain Management:    Induction: Intravenous  PONV Risk Score and Plan: Dexamethasone, Ondansetron, Midazolam and Treatment may vary due to age or medical condition  Airway Management Planned: LMA  Additional Equipment:   Intra-op Plan:   Post-operative Plan: Extubation in OR  Informed Consent: I have reviewed the patients History and Physical, chart, labs and discussed the procedure including the risks, benefits and alternatives for the proposed anesthesia with the patient or authorized representative who has indicated his/her understanding  and acceptance.     Dental Advisory Given  Plan Discussed with: CRNA and Surgeon  Anesthesia Plan Comments:         Anesthesia Quick Evaluation

## 2021-11-27 NOTE — Op Note (Signed)
Preoperative diagnosis: Right inguinal hernia, past episode of incarceration.  Postoperative diagnosis: Same.  Operative procedure: Right direct and indirect inguinal hernia repair with medium Ultra Pro mesh.  Operating surgeon: Hervey Ard, MD.  Anesthesia: General by LMA, Marcaine 0.5% with 1: 200,000 units of epinephrine, 30 cc  Estimated blood loss less than 5 cc.  Clinical note: This very active 81 year old male recently had an episode of incarceration involving a recently identified right inguinal hernia.  This was able to be reduced.  CT showed evidence of the dome of the bladder within the hernia sac.  He is admitted today for planned elective repair.  Bladder scan about an hour after emptying his bladder showed less than 200 cc and Foley catheter is not felt to be necessary.  The patient received Ancef intravenously on induction of anesthesia.  SCD stockings for DVT prevention.  Hair was removed from the surgical site with clippers.  Operative note: With the patient under adequate general anesthesia the area was cleansed with ChloraPrep and draped.  Field block anesthesia was established.  A 5 cm skin line incision along the anticipated course the inguinal canal was made.  Skin was incised sharply remaining dissection completed with electrocautery.  The external oblique was opened in the direction of its fibers and the ilioinguinal, iliohypogastric and genitofemoral nerves were identified and protected.  The cord was mobilized.  There was a pantaloon type hernia centered on the inferior epigastric vessels.  The direct component was significantly larger.  The area was dissected free and the inferior epigastric artery pushed inferiorly to allow placement of the medium ultra Pro mesh against the undersurface of the fascia.  The external component was then smooth along the inguinal canal.  This was anchored to the pubic tubercle and along the inguinal ligament with interrupted 0 Surgilon  sutures.  The medial and superior borders were anchored to the transverse abdominis aponeurosis.  A lateral slit was made for cord passage and closed.  Toradol was placed in the wound.  The external oblique was closed with a running 2-0 Vicryl suture.  Scarpa's fascia was closed with a running 3-0 Vicryl suture.  The skin closed with a running 4-0 Vicryl subcuticular suture.  Benzoin, Steri-Strips, Telfa and Tegaderm dressing was applied.  The patient was taken to the PACU in stable condition.

## 2021-11-30 ENCOUNTER — Encounter: Payer: Self-pay | Admitting: General Surgery

## 2021-12-06 DIAGNOSIS — I480 Paroxysmal atrial fibrillation: Secondary | ICD-10-CM | POA: Diagnosis not present

## 2021-12-28 DIAGNOSIS — L309 Dermatitis, unspecified: Secondary | ICD-10-CM | POA: Diagnosis not present

## 2021-12-28 DIAGNOSIS — L218 Other seborrheic dermatitis: Secondary | ICD-10-CM | POA: Diagnosis not present

## 2022-01-11 DIAGNOSIS — E782 Mixed hyperlipidemia: Secondary | ICD-10-CM | POA: Diagnosis not present

## 2022-01-11 DIAGNOSIS — E1151 Type 2 diabetes mellitus with diabetic peripheral angiopathy without gangrene: Secondary | ICD-10-CM | POA: Diagnosis not present

## 2022-01-11 DIAGNOSIS — R972 Elevated prostate specific antigen [PSA]: Secondary | ICD-10-CM | POA: Diagnosis not present

## 2022-01-14 DIAGNOSIS — D485 Neoplasm of uncertain behavior of skin: Secondary | ICD-10-CM | POA: Diagnosis not present

## 2022-01-14 DIAGNOSIS — D0422 Carcinoma in situ of skin of left ear and external auricular canal: Secondary | ICD-10-CM | POA: Diagnosis not present

## 2022-01-18 DIAGNOSIS — E1151 Type 2 diabetes mellitus with diabetic peripheral angiopathy without gangrene: Secondary | ICD-10-CM | POA: Diagnosis not present

## 2022-01-18 DIAGNOSIS — Z0000 Encounter for general adult medical examination without abnormal findings: Secondary | ICD-10-CM | POA: Diagnosis not present

## 2022-01-18 DIAGNOSIS — I2510 Atherosclerotic heart disease of native coronary artery without angina pectoris: Secondary | ICD-10-CM | POA: Diagnosis not present

## 2022-02-11 DIAGNOSIS — M25562 Pain in left knee: Secondary | ICD-10-CM | POA: Diagnosis not present

## 2022-02-16 DIAGNOSIS — M62830 Muscle spasm of back: Secondary | ICD-10-CM | POA: Diagnosis not present

## 2022-02-16 DIAGNOSIS — M9901 Segmental and somatic dysfunction of cervical region: Secondary | ICD-10-CM | POA: Diagnosis not present

## 2022-02-16 DIAGNOSIS — M9903 Segmental and somatic dysfunction of lumbar region: Secondary | ICD-10-CM | POA: Diagnosis not present

## 2022-02-16 DIAGNOSIS — M5136 Other intervertebral disc degeneration, lumbar region: Secondary | ICD-10-CM | POA: Diagnosis not present

## 2022-02-25 ENCOUNTER — Ambulatory Visit: Payer: No Typology Code available for payment source | Admitting: Urology

## 2022-03-01 DIAGNOSIS — Z961 Presence of intraocular lens: Secondary | ICD-10-CM | POA: Diagnosis not present

## 2022-03-07 NOTE — Progress Notes (Unsigned)
03/09/2022 9:31 AM   Jon Bray 1941-02-08 [id number]  Referring provider: Rusty Aus, MD [facility] [place],  [place] [address]  Chief Complaint  Patient presents with   Nephrolithiasis   Urological history: 1. Nephrolithiasis -Stone composition of 60% calcium oxalate monohydrate and 40% calcium oxalate dihydrate -spontaneous passage of stone 30 years ago  -ESWL (08/2021) 5 mm left distal stone  HPI: Jon Bray is a 81 y.o. who presents today for 6 month follow up with his wife, Jon Bray.  KUB (03/2022) no stones seen.  Stable pelvic phleboliths.    He has no urinary complaints at this time.  He does have some GI complaints. (Indigestion, belching, etc.).  He states this occurs after certain foods.   Patient denies any modifying or aggravating factors.  Patient denies any gross hematuria, dysuria or suprapubic/flank pain.  Patient denies any fevers, chills, nausea or vomiting.     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   Basal cell carcinoma of skin    a.) s/p Mohs procedure R/L nasal ala 01/27/2015   CAD (coronary artery disease) 05/12/2013   a.) STEMI --> LHC 05/12/2013: 20% p-mLAD, 20% D2, 40% LPDA, 30% LPAV, 99% dLCx --> PCI placing 4.5 x 20 mm and 2.5 x 12 mm Veriflex BMS to LPAV extending to dLCx. b.) cCTA 12/27/2017: Ca score 1578; 82nd percentile for age/sex match control; CT-FFR: RCA 0.99, pLAD 0.97, mLAD 0.98, dLAD 0.89, pLCx 0.98, mLCx 0.97, dLCx 0.97, PLB 0.91, PDA (most distal segment) 0.80.   Cataract    a.) s/p extraction with IOL placement   CKD (chronic kidney disease)    Diverticulosis    GERD (gastroesophageal reflux disease)    History of kidney stones 2023   HLD (hyperlipidemia)    Left lower lobe pulmonary nodule 08/01/2021   a.) CT 08/01/2021: measured 4 mm;  perifissural   Long term current use of anticoagulant    a.) apixaban   NSVT (nonsustained ventricular tachycardia) ([place]) 05/13/2013   a.) 24 beat run while in ICU following PCI for STEMI   PAF (paroxysmal atrial fibrillation) ([place]) 11/16/2021   a.) new onset in ED on 11/16/2021. b.) CHA2DS2-VASc = 4 (age x 2, STEMI/aortic plaque, T2DM). c.) rate controlled without pharmacological intervention; started on apixaban 11/16/2021, however PCP discontinued on 11/18/2021 pending Holter study results.   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   Type 2 diabetes, diet controlled ([place])     Surgical History: Past Surgical History:  Procedure Laterality Date   CATARACT EXTRACTION W/ INTRAOCULAR LENS IMPLANT Bilateral    COLONOSCOPY N/A 01/31/2001   COLONOSCOPY N/A 11/19/2008   CORONARY ANGIOPLASTY WITH STENT PLACEMENT Left 05/12/2013   Procedure: CORONARY ANGIOPLASTY WITH STENT PLACEMENT; Location: [facility]; Surgeon: Reynolds Bowl, MD   ESOPHAGOGASTRODUODENOSCOPY N/A 03/03/2000   ESOPHAGOGASTRODUODENOSCOPY (EGD) WITH PROPOFOL N/A 10/16/2021   Procedure: ESOPHAGOGASTRODUODENOSCOPY (EGD) WITH PROPOFOL;  Surgeon: Lesly Rubenstein, MD;  Location: [facility] END[facility];  Service: Endoscopy;  Laterality: N/A;   EXTRACORPOREAL SHOCK WAVE LITHOTRIPSY Left 08/06/2021   Procedure: EXTRACORPOREAL SHOCK WAVE LITHOTRIPSY (  ESWL);  Surgeon: Hollice Espy, MD;  Location: [facility] ORS;  Service: Urology;  Laterality: Left;   INGUINAL HERNIA REPAIR Right 11/27/2021   Procedure: HERNIA REPAIR INGUINAL ADULT;  Surgeon: Robert Bellow, MD;  Location: [facility] OR[facility];  Service: General;  Laterality: Right;   INTRAOCULAR LENS EXCHANGE Left 12/27/2018   Procedure: MECHANICAL VITRECTOMY,  EXCHANGE LENS PROSTHESIS VIA  PARS PLANA APPROACH; Location: [facility]; Surgeon: Isaias Sakai, MD   MOHS SURGERY Bilateral 01/27/2015   Procedure: MOHS SURGERY TO REMOVE BASAL CELL CARCINOMA FROM RIGHT/LEFT NASAL ALA; Location: [facility]; Surgeon: Ishmael Holter, MD   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     Lactobacillus (PROBIOTIC ACIDOPHILUS PO) Take by mouth.     Multiple Vitamin (MULTIVITAMIN) tablet Take 1 tablet by mouth daily.     Zinc Sulfate (ZINC-220 PO) Take by mouth.     bisacodyl (DULCOLAX) 5 MG EC tablet Take 5 mg by mouth daily as needed for moderate constipation. (Patient not taking: Reported on 03/09/2022)     ferrous sulfate 325 (65 FE) MG tablet Take 325 mg by mouth daily with breakfast. (Patient not taking: Reported on 03/09/2022)     HYDROcodone-acetaminophen (NORCO/VICODIN) 5-325 MG tablet Take 1 tablet by mouth every 4 (four) hours as needed for moderate pain. (Patient not taking: Reported on 03/09/2022) 12 tablet 0   Omega-3 Fatty Acids (FISH OIL) 1000 MG CAPS Take 1 capsule by mouth. (Patient not taking: Reported on 03/09/2022)     vitamin B-12 (CYANOCOBALAMIN) 1000 MCG tablet Take 1,000 mcg by mouth daily at 12 noon. (Patient not taking: Reported on 03/09/2022)     No current facility-administered medications on file prior to visit.    Allergies: No Known Allergies  Family History: No family history on file.  Social History:  reports that he has never  smoked. He has never used smokeless tobacco. He reports that he does not drink alcohol and does not use drugs.  ROS: Pertinent ROS in HPI  Physical Exam: BP 130/68   Pulse 66   Ht $R'5\' 8"'xt$  (1.727 m)   Wt 175 lb (79.4 kg)   BMI 26.61 kg/m   Constitutional:  Well nourished. Alert and oriented, No acute distress. HEENT: [place] AT, moist mucus membranes.  Trachea midline Cardiovascular: No clubbing, cyanosis, or edema. Respiratory: Normal respiratory effort, no increased work of breathing. Neurologic: Grossly intact, no focal deficits, moving all 4 extremities. Psychiatric: Normal mood and affect.   Laboratory Data: WBC (White Blood Cell Count) 4.1 - 10.2 10^3/uL 6.8   RBC (Red Blood Cell Count) 4.69 - 6.13 10^6/uL 4.91   Hemoglobin 14.1 - 18.1 gm/dL 13.8 Low    Hematocrit 40.0 - 52.0 % 43.3   MCV (Mean Corpuscular Volume) 80.0 - 100.0 fl 88.2   MCH (Mean Corpuscular Hemoglobin) 27.0 - 31.2 pg 28.1   MCHC (Mean Corpuscular Hemoglobin Concentration) 32.0 - 36.0 gm/dL 31.9 Low    Platelet Count 150 - 450 10^3/uL 188   RDW-CV (Red Cell Distribution Width)  11.6 - 14.8 % 14.6   MPV (Mean Platelet Volume) 9.4 - 12.4 fl 11.3   Neutrophils 1.50 - 7.80 10^3/uL 2.65   Lymphocytes 1.00 - 3.60 10^3/uL 3.45   Monocytes 0.00 - 1.50 10^3/uL 0.49   Eosinophils 0.00 - 0.55 10^3/uL 0.17   Basophils 0.00 - 0.09 10^3/uL 0.06   Neutrophil % 32.0 - 70.0 % 38.8   Lymphocyte % 10.0 - 50.0 % 50.5 High    Monocyte % 4.0 - 13.0 % 7.2   Eosinophil % 1.0 - 5.0 % 2.5   Basophil% 0.0 - 2.0 % 0.9   Immature Granulocyte % <=0.7 % 0.1   Immature Granulocyte Count <=0.06 10^3/L 0.01   Resulting Agency  [facility] - [facility]   Specimen Collected: 01/11/22 07:48   Performed by: [place]: 01/11/22 09:26  Received From: [facility]  Result Received: 03/07/22 17:06   Glucose 70 - 110 mg/dL 104   Sodium 136 - 145 mmol/L 140   Potassium 3.6 - 5.1 mmol/L 4.0    Chloride 97 - 109 mmol/L 104   Carbon Dioxide (CO2) 22.0 - 32.0 mmol/L 29.0   Urea Nitrogen (BUN) 7 - 25 mg/dL 28 High    Creatinine 0.7 - 1.3 mg/dL 1.0   Glomerular Filtration Rate (eGFR), MDRD Estimate >60 mL/min/1.73sq m 72   Calcium 8.7 - 10.3 mg/dL 9.0   AST  8 - 39 U/L 27   ALT  6 - 57 U/L 21   Alk Phos (alkaline Phosphatase) 34 - 104 U/L 51   Albumin 3.5 - 4.8 g/dL 4.4   Bilirubin, Total 0.3 - 1.2 mg/dL 0.9   Protein, Total 6.1 - 7.9 g/dL 6.9   A/G Ratio 1.0 - 5.0 gm/dL 1.8   Resulting Agency  [facility]   Specimen Collected: 01/11/22 07:48   Performed by: [place]: 01/11/22 10:34  Received From: [facility]  Result Received: 03/07/22 17:06   Hemoglobin A1C 4.2 - 5.6 % 6.4 High    Average Blood Glucose (Calc) mg/dL [facility]  Narrative Performed by [facility] Normal Range:    4.2 - 5.6%  Increased Risk:  5.7 - 6.4%  Diabetes:        >= 6.5%  Glycemic Control for adults with diabetes:  <7%    Specimen Collected: 01/11/22 07:48   Performed by: [facility] Last Resulted: 01/11/22 12:31  Received From: [facility]  Result Received: 03/07/22 17:06   Cholesterol, Total 100 - 200 mg/dL 178   Triglyceride 35 - 199 mg/dL 142   HDL (High Density Lipoprotein) Cholesterol 29.0 - 71.0 mg/dL 41.7   LDL Calculated 0 - 130 mg/dL 108   VLDL Cholesterol mg/dL 28   Cholesterol/HDL Ratio  4.3   Resulting Agency  [facility] - [facility]   Specimen Collected: 01/11/22 07:48   Performed by: [facility] Last Resulted: 01/11/22 10:36  Received From: [facility]  Result Received: 03/07/22 17:06   Thyroid Stimulating Hormone (TSH) 0.450-5.330 uIU/ml uIU/mL 3.390   Resulting Agency  [facility] - [facility]   Specimen Collected: 01/11/22 07:48   Performed by: [facility]  Resulted: 01/11/22 10:37  Received From: [facility]  Result Received: 03/07/22 17:06   PSA (Prostate Specific  Antigen), Total 0.10 - 4.00 ng/mL 1.59   Resulting Agency  [facility] - [facility]  Narrative Performed by [facility] - [facility] Test results were determined with Beckman Coulter Hybritech Assay. Values obtained with different assay methods cannot be used interchangeably in serial testing. Assay results should not be interpreted as absolute evidence of the presence or absence of malignant disease  Specimen Collected: 01/11/22 07:48   Performed by: [place]: 01/11/22 10:36  Received From: [facility]  Result Received: 03/07/22 17:06   Urinalysis Color Yellow, Violet, Light Violet, Dark Violet Yellow   Clarity Clear, Other Clear   Specific Gravity 1.000 - 1.030 1.020   pH, Urine 5.0 - 8.0 6.0   Protein, Urinalysis Negative, Trace mg/dL Negative   Glucose, Urinalysis Negative mg/dL Negative   Ketones, Urinalysis Negative mg/dL Negative   Blood, Urinalysis Negative Negative   Nitrite, Urinalysis Negative Negative   Leukocyte Esterase, Urinalysis Negative Negative   White Blood Cells, Urinalysis None Seen, 0-3 /hpf None Seen   Red Blood Cells, Urinalysis None Seen, 0-3 /hpf None Seen   Bacteria, Urinalysis None Seen /hpf None Seen   Squamous Epithelial Cells, Urinalysis Rare, Few, None Seen /hpf None Seen   Resulting Agency  [facility] - [facility]   Specimen Collected: 01/11/22 07:48   Performed by: [place]: 01/11/22 08:31  Received From: [facility]  Result Received: 03/07/22 17:06  I have reviewed the labs.   Pertinent Imaging: KUB (03/2022) no stones seen  I have independently reviewed the films.    Assessment & Plan:    1.  Nephrolithiasis -KUB (03/2022) no stones seen  -Reviewed stone prevention diet  2. Prostate cancer  screening -Discussed current AUA guidelines (10/2021) recommend discontinuing prostate cancer screening in individuals over the age of 48 who's PSA was less than 2 at age 79  -If an individual decides to continue PSA screening, the new guidelines also recommend only screening every 2 to 4 years at this time  3. GI symptoms -encouraged him to reach out to his PCP or GI provider for further follow up    Return in about 1 year (around 03/10/2023), or if symptoms worsen or fail to improve.  These notes generated with voice recognition software. I apologize for typographical errors.  [place], [place] [address]  [place],  [address] [phone]

## 2022-03-09 ENCOUNTER — Encounter: Payer: Self-pay | Admitting: Urology

## 2022-03-09 ENCOUNTER — Ambulatory Visit
Admission: RE | Admit: 2022-03-09 | Discharge: 2022-03-09 | Disposition: A | Discharge status: Home / Self Care | Payer: No Typology Code available for payment source | Source: Ambulatory Visit | Attending: Urology | Admitting: Urology

## 2022-03-09 ENCOUNTER — Ambulatory Visit (INDEPENDENT_AMBULATORY_CARE_PROVIDER_SITE_OTHER): Payer: No Typology Code available for payment source | Admitting: Urology

## 2022-03-09 VITALS — BP 130/68 | HR 66 | Ht 68.0 in | Wt 175.0 lb

## 2022-03-09 DIAGNOSIS — N200 Calculus of kidney: Secondary | ICD-10-CM

## 2022-03-09 DIAGNOSIS — Z125 Encounter for screening for malignant neoplasm of prostate: Secondary | ICD-10-CM

## 2022-03-09 DIAGNOSIS — I878 Other specified disorders of veins: Secondary | ICD-10-CM | POA: Diagnosis not present

## 2022-03-12 DIAGNOSIS — D0422 Carcinoma in situ of skin of left ear and external auricular canal: Secondary | ICD-10-CM | POA: Diagnosis not present

## 2022-04-20 DIAGNOSIS — M9901 Segmental and somatic dysfunction of cervical region: Secondary | ICD-10-CM | POA: Diagnosis not present

## 2022-04-20 DIAGNOSIS — M9903 Segmental and somatic dysfunction of lumbar region: Secondary | ICD-10-CM | POA: Diagnosis not present

## 2022-04-20 DIAGNOSIS — M5136 Other intervertebral disc degeneration, lumbar region: Secondary | ICD-10-CM | POA: Diagnosis not present

## 2022-04-20 DIAGNOSIS — M62830 Muscle spasm of back: Secondary | ICD-10-CM | POA: Diagnosis not present

## 2022-04-23 DIAGNOSIS — L578 Other skin changes due to chronic exposure to nonionizing radiation: Secondary | ICD-10-CM | POA: Diagnosis not present

## 2022-04-23 DIAGNOSIS — L570 Actinic keratosis: Secondary | ICD-10-CM | POA: Diagnosis not present

## 2022-05-03 DIAGNOSIS — Z03818 Encounter for observation for suspected exposure to other biological agents ruled out: Secondary | ICD-10-CM | POA: Diagnosis not present

## 2022-05-03 DIAGNOSIS — J0100 Acute maxillary sinusitis, unspecified: Secondary | ICD-10-CM | POA: Diagnosis not present

## 2022-05-03 DIAGNOSIS — R0989 Other specified symptoms and signs involving the circulatory and respiratory systems: Secondary | ICD-10-CM | POA: Diagnosis not present

## 2022-05-18 DIAGNOSIS — M9901 Segmental and somatic dysfunction of cervical region: Secondary | ICD-10-CM | POA: Diagnosis not present

## 2022-05-18 DIAGNOSIS — M62830 Muscle spasm of back: Secondary | ICD-10-CM | POA: Diagnosis not present

## 2022-05-18 DIAGNOSIS — M9903 Segmental and somatic dysfunction of lumbar region: Secondary | ICD-10-CM | POA: Diagnosis not present

## 2022-05-18 DIAGNOSIS — M5136 Other intervertebral disc degeneration, lumbar region: Secondary | ICD-10-CM | POA: Diagnosis not present

## 2022-05-26 DIAGNOSIS — R112 Nausea with vomiting, unspecified: Secondary | ICD-10-CM | POA: Diagnosis not present

## 2022-05-26 DIAGNOSIS — K2990 Gastroduodenitis, unspecified, without bleeding: Secondary | ICD-10-CM | POA: Diagnosis not present

## 2022-05-31 ENCOUNTER — Other Ambulatory Visit: Payer: Self-pay | Admitting: Internal Medicine

## 2022-05-31 DIAGNOSIS — R748 Abnormal levels of other serum enzymes: Secondary | ICD-10-CM

## 2022-06-01 ENCOUNTER — Ambulatory Visit
Admission: RE | Admit: 2022-06-01 | Discharge: 2022-06-01 | Disposition: A | Discharge status: Home / Self Care | Payer: No Typology Code available for payment source | Source: Ambulatory Visit | Attending: Internal Medicine | Admitting: Internal Medicine

## 2022-06-01 DIAGNOSIS — R110 Nausea: Secondary | ICD-10-CM | POA: Diagnosis not present

## 2022-06-01 DIAGNOSIS — R748 Abnormal levels of other serum enzymes: Secondary | ICD-10-CM | POA: Insufficient documentation

## 2022-06-01 DIAGNOSIS — K8020 Calculus of gallbladder without cholecystitis without obstruction: Secondary | ICD-10-CM | POA: Diagnosis not present

## 2022-06-15 DIAGNOSIS — M5136 Other intervertebral disc degeneration, lumbar region: Secondary | ICD-10-CM | POA: Diagnosis not present

## 2022-06-15 DIAGNOSIS — M9901 Segmental and somatic dysfunction of cervical region: Secondary | ICD-10-CM | POA: Diagnosis not present

## 2022-06-15 DIAGNOSIS — M62830 Muscle spasm of back: Secondary | ICD-10-CM | POA: Diagnosis not present

## 2022-06-15 DIAGNOSIS — M9903 Segmental and somatic dysfunction of lumbar region: Secondary | ICD-10-CM | POA: Diagnosis not present

## 2022-07-01 DIAGNOSIS — R748 Abnormal levels of other serum enzymes: Secondary | ICD-10-CM | POA: Diagnosis not present

## 2022-07-01 DIAGNOSIS — E1151 Type 2 diabetes mellitus with diabetic peripheral angiopathy without gangrene: Secondary | ICD-10-CM | POA: Diagnosis not present

## 2022-07-13 DIAGNOSIS — M9903 Segmental and somatic dysfunction of lumbar region: Secondary | ICD-10-CM | POA: Diagnosis not present

## 2022-07-13 DIAGNOSIS — M5136 Other intervertebral disc degeneration, lumbar region: Secondary | ICD-10-CM | POA: Diagnosis not present

## 2022-07-13 DIAGNOSIS — M9901 Segmental and somatic dysfunction of cervical region: Secondary | ICD-10-CM | POA: Diagnosis not present

## 2022-07-13 DIAGNOSIS — M62830 Muscle spasm of back: Secondary | ICD-10-CM | POA: Diagnosis not present

## 2022-07-21 DIAGNOSIS — Z125 Encounter for screening for malignant neoplasm of prostate: Secondary | ICD-10-CM | POA: Diagnosis not present

## 2022-07-21 DIAGNOSIS — I25118 Atherosclerotic heart disease of native coronary artery with other forms of angina pectoris: Secondary | ICD-10-CM | POA: Diagnosis not present

## 2022-07-21 DIAGNOSIS — R748 Abnormal levels of other serum enzymes: Secondary | ICD-10-CM | POA: Diagnosis not present

## 2022-07-21 DIAGNOSIS — I480 Paroxysmal atrial fibrillation: Secondary | ICD-10-CM | POA: Diagnosis not present

## 2022-07-21 DIAGNOSIS — E1151 Type 2 diabetes mellitus with diabetic peripheral angiopathy without gangrene: Secondary | ICD-10-CM | POA: Diagnosis not present

## 2022-07-22 DIAGNOSIS — L821 Other seborrheic keratosis: Secondary | ICD-10-CM | POA: Diagnosis not present

## 2022-07-22 DIAGNOSIS — D2271 Melanocytic nevi of right lower limb, including hip: Secondary | ICD-10-CM | POA: Diagnosis not present

## 2022-07-22 DIAGNOSIS — C4441 Basal cell carcinoma of skin of scalp and neck: Secondary | ICD-10-CM | POA: Diagnosis not present

## 2022-07-22 DIAGNOSIS — D0461 Carcinoma in situ of skin of right upper limb, including shoulder: Secondary | ICD-10-CM | POA: Diagnosis not present

## 2022-07-22 DIAGNOSIS — D485 Neoplasm of uncertain behavior of skin: Secondary | ICD-10-CM | POA: Diagnosis not present

## 2022-07-22 DIAGNOSIS — D2262 Melanocytic nevi of left upper limb, including shoulder: Secondary | ICD-10-CM | POA: Diagnosis not present

## 2022-07-22 DIAGNOSIS — D044 Carcinoma in situ of skin of scalp and neck: Secondary | ICD-10-CM | POA: Diagnosis not present

## 2022-07-22 DIAGNOSIS — D225 Melanocytic nevi of trunk: Secondary | ICD-10-CM | POA: Diagnosis not present

## 2022-07-22 DIAGNOSIS — D2272 Melanocytic nevi of left lower limb, including hip: Secondary | ICD-10-CM | POA: Diagnosis not present

## 2022-07-22 DIAGNOSIS — X32XXXA Exposure to sunlight, initial encounter: Secondary | ICD-10-CM | POA: Diagnosis not present

## 2022-07-22 DIAGNOSIS — D2261 Melanocytic nevi of right upper limb, including shoulder: Secondary | ICD-10-CM | POA: Diagnosis not present

## 2022-07-22 DIAGNOSIS — L570 Actinic keratosis: Secondary | ICD-10-CM | POA: Diagnosis not present

## 2022-08-10 DIAGNOSIS — M9901 Segmental and somatic dysfunction of cervical region: Secondary | ICD-10-CM | POA: Diagnosis not present

## 2022-08-10 DIAGNOSIS — M62830 Muscle spasm of back: Secondary | ICD-10-CM | POA: Diagnosis not present

## 2022-08-10 DIAGNOSIS — M5136 Other intervertebral disc degeneration, lumbar region: Secondary | ICD-10-CM | POA: Diagnosis not present

## 2022-08-10 DIAGNOSIS — M9903 Segmental and somatic dysfunction of lumbar region: Secondary | ICD-10-CM | POA: Diagnosis not present

## 2022-08-19 DIAGNOSIS — C4441 Basal cell carcinoma of skin of scalp and neck: Secondary | ICD-10-CM | POA: Diagnosis not present

## 2022-08-19 DIAGNOSIS — D0461 Carcinoma in situ of skin of right upper limb, including shoulder: Secondary | ICD-10-CM | POA: Diagnosis not present

## 2022-08-19 DIAGNOSIS — D044 Carcinoma in situ of skin of scalp and neck: Secondary | ICD-10-CM | POA: Diagnosis not present

## 2022-09-09 DIAGNOSIS — M5136 Other intervertebral disc degeneration, lumbar region: Secondary | ICD-10-CM | POA: Diagnosis not present

## 2022-09-09 DIAGNOSIS — M9903 Segmental and somatic dysfunction of lumbar region: Secondary | ICD-10-CM | POA: Diagnosis not present

## 2022-09-09 DIAGNOSIS — M62830 Muscle spasm of back: Secondary | ICD-10-CM | POA: Diagnosis not present

## 2022-09-09 DIAGNOSIS — M9901 Segmental and somatic dysfunction of cervical region: Secondary | ICD-10-CM | POA: Diagnosis not present

## 2022-09-28 DIAGNOSIS — R1084 Generalized abdominal pain: Secondary | ICD-10-CM | POA: Diagnosis not present

## 2022-09-28 DIAGNOSIS — K2100 Gastro-esophageal reflux disease with esophagitis, without bleeding: Secondary | ICD-10-CM | POA: Diagnosis not present

## 2022-09-28 DIAGNOSIS — K831 Obstruction of bile duct: Secondary | ICD-10-CM | POA: Diagnosis not present

## 2022-09-28 DIAGNOSIS — R634 Abnormal weight loss: Secondary | ICD-10-CM | POA: Diagnosis not present

## 2022-09-28 DIAGNOSIS — R0789 Other chest pain: Secondary | ICD-10-CM | POA: Diagnosis not present

## 2022-09-29 ENCOUNTER — Ambulatory Visit
Admission: RE | Admit: 2022-09-29 | Discharge: 2022-09-29 | Disposition: A | Discharge status: Home / Self Care | Payer: HMO | Source: Ambulatory Visit | Attending: Internal Medicine | Admitting: Internal Medicine

## 2022-09-29 ENCOUNTER — Other Ambulatory Visit: Payer: Self-pay | Admitting: Internal Medicine

## 2022-09-29 DIAGNOSIS — K2100 Gastro-esophageal reflux disease with esophagitis, without bleeding: Secondary | ICD-10-CM | POA: Diagnosis not present

## 2022-09-29 DIAGNOSIS — R634 Abnormal weight loss: Secondary | ICD-10-CM

## 2022-09-29 DIAGNOSIS — K831 Obstruction of bile duct: Secondary | ICD-10-CM

## 2022-09-29 DIAGNOSIS — K5730 Diverticulosis of large intestine without perforation or abscess without bleeding: Secondary | ICD-10-CM | POA: Diagnosis not present

## 2022-09-29 DIAGNOSIS — R109 Unspecified abdominal pain: Secondary | ICD-10-CM | POA: Diagnosis not present

## 2022-09-29 DIAGNOSIS — N3289 Other specified disorders of bladder: Secondary | ICD-10-CM | POA: Diagnosis not present

## 2022-09-29 MED ORDER — IOHEXOL 300 MG/ML  SOLN
100.0000 mL | Freq: Once | INTRAMUSCULAR | Status: AC | PRN
  Administered 2022-09-29: 100 mL via INTRAVENOUS

## 2022-09-30 ENCOUNTER — Ambulatory Visit
Admission: RE | Admit: 2022-09-30 | Discharge: 2022-09-30 | Disposition: A | Discharge status: Home / Self Care | Payer: HMO | Source: Ambulatory Visit | Attending: Internal Medicine | Admitting: Internal Medicine

## 2022-09-30 ENCOUNTER — Other Ambulatory Visit: Payer: Self-pay | Admitting: Internal Medicine

## 2022-09-30 DIAGNOSIS — K831 Obstruction of bile duct: Secondary | ICD-10-CM | POA: Diagnosis not present

## 2022-09-30 DIAGNOSIS — R935 Abnormal findings on diagnostic imaging of other abdominal regions, including retroperitoneum: Secondary | ICD-10-CM | POA: Diagnosis not present

## 2022-09-30 DIAGNOSIS — R634 Abnormal weight loss: Secondary | ICD-10-CM | POA: Diagnosis not present

## 2022-09-30 DIAGNOSIS — K8050 Calculus of bile duct without cholangitis or cholecystitis without obstruction: Secondary | ICD-10-CM | POA: Diagnosis not present

## 2022-09-30 MED ORDER — GADOBUTROL 1 MMOL/ML IV SOLN
7.5000 mL | Freq: Once | INTRAVENOUS | Status: AC | PRN
  Administered 2022-09-30: 7.5 mL via INTRAVENOUS

## 2022-10-04 ENCOUNTER — Telehealth: Payer: Self-pay

## 2022-10-04 NOTE — Telephone Encounter (Signed)
Pt  wife left message stating that Jon Bray wants Dr. Allen Norris to do a ERCP. I did not see a referral from Dr, Sabra Heck.

## 2022-10-04 NOTE — Telephone Encounter (Signed)
Left message on voicemail We need a referral before scheduling an appt or procedure... Asked them to reach out to PCP requesting the referral to be sent over

## 2022-10-05 DIAGNOSIS — M5136 Other intervertebral disc degeneration, lumbar region: Secondary | ICD-10-CM | POA: Diagnosis not present

## 2022-10-05 DIAGNOSIS — M9903 Segmental and somatic dysfunction of lumbar region: Secondary | ICD-10-CM | POA: Diagnosis not present

## 2022-10-05 DIAGNOSIS — M62830 Muscle spasm of back: Secondary | ICD-10-CM | POA: Diagnosis not present

## 2022-10-05 DIAGNOSIS — M9901 Segmental and somatic dysfunction of cervical region: Secondary | ICD-10-CM | POA: Diagnosis not present

## 2022-10-06 NOTE — Telephone Encounter (Signed)
Received patients EMERGENCY referral. Please advise where to schedule.  Diagnosis:  Abnormal weight loss, Biliary obstruction, Obstructive jaundice, Choledocholithiasis ERCP

## 2022-10-07 DIAGNOSIS — K8050 Calculus of bile duct without cholangitis or cholecystitis without obstruction: Secondary | ICD-10-CM | POA: Diagnosis not present

## 2022-10-12 DIAGNOSIS — K8020 Calculus of gallbladder without cholecystitis without obstruction: Secondary | ICD-10-CM | POA: Diagnosis not present

## 2022-11-02 DIAGNOSIS — M9903 Segmental and somatic dysfunction of lumbar region: Secondary | ICD-10-CM | POA: Diagnosis not present

## 2022-11-02 DIAGNOSIS — M62830 Muscle spasm of back: Secondary | ICD-10-CM | POA: Diagnosis not present

## 2022-11-02 DIAGNOSIS — M5136 Other intervertebral disc degeneration, lumbar region: Secondary | ICD-10-CM | POA: Diagnosis not present

## 2022-11-02 DIAGNOSIS — M9901 Segmental and somatic dysfunction of cervical region: Secondary | ICD-10-CM | POA: Diagnosis not present

## 2022-11-12 DIAGNOSIS — K831 Obstruction of bile duct: Secondary | ICD-10-CM | POA: Diagnosis not present

## 2022-11-16 ENCOUNTER — Encounter: Payer: Self-pay | Admitting: *Deleted

## 2022-11-16 ENCOUNTER — Other Ambulatory Visit: Payer: Self-pay

## 2022-11-16 ENCOUNTER — Emergency Department
Admission: EM | Admit: 2022-11-16 | Discharge: 2022-11-17 | Disposition: A | Discharge status: Home / Self Care | Payer: HMO | Attending: Emergency Medicine | Admitting: Emergency Medicine

## 2022-11-16 ENCOUNTER — Emergency Department: Payer: HMO

## 2022-11-16 DIAGNOSIS — R109 Unspecified abdominal pain: Secondary | ICD-10-CM | POA: Diagnosis not present

## 2022-11-16 DIAGNOSIS — R112 Nausea with vomiting, unspecified: Secondary | ICD-10-CM | POA: Insufficient documentation

## 2022-11-16 DIAGNOSIS — R1010 Upper abdominal pain, unspecified: Secondary | ICD-10-CM | POA: Insufficient documentation

## 2022-11-16 DIAGNOSIS — J9811 Atelectasis: Secondary | ICD-10-CM | POA: Diagnosis not present

## 2022-11-16 DIAGNOSIS — K8050 Calculus of bile duct without cholangitis or cholecystitis without obstruction: Secondary | ICD-10-CM

## 2022-11-16 DIAGNOSIS — R935 Abnormal findings on diagnostic imaging of other abdominal regions, including retroperitoneum: Secondary | ICD-10-CM | POA: Diagnosis not present

## 2022-11-16 DIAGNOSIS — K8020 Calculus of gallbladder without cholecystitis without obstruction: Secondary | ICD-10-CM | POA: Diagnosis not present

## 2022-11-16 DIAGNOSIS — R9431 Abnormal electrocardiogram [ECG] [EKG]: Secondary | ICD-10-CM | POA: Diagnosis not present

## 2022-11-16 DIAGNOSIS — I700 Atherosclerosis of aorta: Secondary | ICD-10-CM | POA: Diagnosis not present

## 2022-11-16 DIAGNOSIS — R0602 Shortness of breath: Secondary | ICD-10-CM | POA: Diagnosis not present

## 2022-11-16 LAB — LIPASE, BLOOD: Lipase: >10000 U/L — ABNORMAL HIGH (ref 11–51)

## 2022-11-16 LAB — COMPREHENSIVE METABOLIC PANEL
ALT: 236 U/L — ABNORMAL HIGH (ref 0–44)
AST: 236 U/L — ABNORMAL HIGH (ref 15–41)
Albumin: 4.2 g/dL (ref 3.5–5.0)
Alkaline Phosphatase: 141 U/L — ABNORMAL HIGH (ref 38–126)
Anion gap: 11 (ref 5–15)
BUN: 18 mg/dL (ref 8–23)
CO2: 24 mmol/L (ref 22–32)
Calcium: 8.9 mg/dL (ref 8.9–10.3)
Chloride: 105 mmol/L (ref 98–111)
Creatinine, Ser: 0.78 mg/dL (ref 0.61–1.24)
GFR, Estimated: >60 mL/min (ref >60)
Glucose, Bld: 164 mg/dL — ABNORMAL HIGH (ref 70–99)
Potassium: 3.4 mmol/L — ABNORMAL LOW (ref 3.5–5.1)
Sodium: 140 mmol/L (ref 135–145)
Total Bilirubin: 2.5 mg/dL — ABNORMAL HIGH (ref 0.3–1.2)
Total Protein: 7.6 g/dL (ref 6.5–8.1)

## 2022-11-16 LAB — CBC
HCT: 47 % (ref 39.0–52.0)
Hemoglobin: 15.4 g/dL (ref 13.0–17.0)
MCH: 29.1 pg (ref 26.0–34.0)
MCHC: 32.8 g/dL (ref 30.0–36.0)
MCV: 88.7 fL (ref 80.0–100.0)
Platelets: 223 10*3/uL (ref 150–400)
RBC: 5.3 MIL/uL (ref 4.22–5.81)
RDW: 15.6 % — ABNORMAL HIGH (ref 11.5–15.5)
WBC: 12.8 10*3/uL — ABNORMAL HIGH (ref 4.0–10.5)
nRBC: 0 % (ref 0.0–0.2)

## 2022-11-16 LAB — TROPONIN I (HIGH SENSITIVITY): Troponin I (High Sensitivity): 9 ng/L (ref <18)

## 2022-11-16 MED ORDER — ONDANSETRON HCL 4 MG/2ML IJ SOLN
4.0000 mg | Freq: Once | INTRAMUSCULAR | Status: AC
  Administered 2022-11-16: 4 mg via INTRAVENOUS
  Filled 2022-11-16: qty 2

## 2022-11-16 MED ORDER — IOHEXOL 300 MG/ML  SOLN
100.0000 mL | Freq: Once | INTRAMUSCULAR | Status: AC | PRN
  Administered 2022-11-16: 100 mL via INTRAVENOUS

## 2022-11-16 MED ORDER — SODIUM CHLORIDE 0.9 % IV BOLUS
500.0000 mL | Freq: Once | INTRAVENOUS | Status: AC
  Administered 2022-11-16: 500 mL via INTRAVENOUS

## 2022-11-16 MED ORDER — GADOBUTROL 1 MMOL/ML IV SOLN
7.5000 mL | Freq: Once | INTRAVENOUS | Status: AC | PRN
  Administered 2022-11-16: 7.5 mL via INTRAVENOUS

## 2022-11-16 MED ORDER — HYDROMORPHONE HCL 1 MG/ML IJ SOLN
0.5000 mg | Freq: Once | INTRAMUSCULAR | Status: AC
  Administered 2022-11-16: 0.5 mg via INTRAVENOUS
  Filled 2022-11-16: qty 0.5

## 2022-11-16 NOTE — ED Provider Notes (Signed)
[facility] Provider Note    Event Date/Time   First MD Initiated Contact with Patient 11/16/22 2003     (approximate)   History   Emesis   HPI  Jon Bray is a 82 y.o. male with history of known gallstones who comes in with concerns for nausea and vomiting.  On review of records patient was seen on 4/9 by Dr. Maia Plan.  On review of records it does appear that he had a prior MRCP that showed choledocholithiasis that he went to his PCP who told him to go to the ER but patient did not want to go given his pain had resolved.  He was seen by surgical team and it was thought that he most likely passed the stone but they still recommended cholecystectomy to prevent recurrent episodes of choledocholithiasis.  However at that time patient would like to try acupuncture and spray under his tongue  Patient reports that his symptoms started around 2 PM.  Patient reports multiple episodes of nausea and vomiting as well as upper abdominal pain.  He reports this feels similar to when he had the choledocholithiasis previously.  He denies any chest pain, shortness of breath.  Denies any falls hitting his head.  I reviewed the records where he had CT imaging done on 3/27.  They noted some thickening of the bladder that they recommended cystoscopy for as well as had an MRCP that showed Choledocolithiasis    Physical Exam   Triage Vital Signs: ED Triage Vitals  Enc Vitals Group     BP 11/16/22 2000 135/71     Pulse Rate 11/16/22 2000 67     Resp 11/16/22 2000 18     Temp 11/16/22 2000 97.6 F (36.4 C)     Temp Source 11/16/22 2000 Oral     SpO2 11/16/22 2000 97 %     Weight 11/16/22 1957 168 lb (76.2 kg)     Height 11/16/22 1957 5\' 8"  (1.727 m)     Head Circumference --      Peak Flow --      Pain Score 11/16/22 1957 8     Pain Loc --      Pain Edu? --      Excl. in GC? --     Most recent vital signs: Vitals:   11/16/22 2000  BP: 135/71  Pulse: 67  Resp: 18   Temp: 97.6 F (36.4 C)  SpO2: 97%     General: Awake, no distress.  CV:  Good peripheral perfusion.  Resp:  Normal effort.  Abd:  No distention.  Tender in the upper abdomen. Other:     ED Results / Procedures / Treatments   Labs (all labs ordered are listed, but only abnormal results are displayed) Labs Reviewed  LIPASE, BLOOD  COMPREHENSIVE METABOLIC PANEL  CBC  URINALYSIS, ROUTINE W REFLEX MICROSCOPIC     EKG  My interpretation of EKG:  Normal sinus rate of 60 without any ST elevation or T wave inversions, normal intervals  RADIOLOGY I have reviewed the ultrasound personally interpreted and patient does have gallstones.  PROCEDURES:  Critical Care performed: No  .1-3 Lead EKG Interpretation  Performed by: Concha Se, MD Authorized by: Concha Se, MD     Interpretation: normal     ECG rate:  60   ECG rate assessment: normal     Rhythm: sinus rhythm     Ectopy: none     Conduction: normal  MEDICATIONS ORDERED IN ED: Medications  gadobutrol (GADAVIST) 1 MMOL/ML injection 7.5 mL (has no administration in time range)  iohexol (OMNIPAQUE) 300 MG/ML solution 100 mL (has no administration in time range)  sodium chloride 0.9 % bolus 500 mL (0 mLs Intravenous Stopped 11/16/22 2209)  HYDROmorphone (DILAUDID) injection 0.5 mg (0.5 mg Intravenous Given 11/16/22 2049)  ondansetron (ZOFRAN) injection 4 mg (4 mg Intravenous Given 11/16/22 2048)     IMPRESSION / MDM / ASSESSMENT AND PLAN / ED COURSE  I reviewed the triage vital signs and the nursing notes.   Patient's presentation is most consistent with acute presentation with potential threat to life or bodily function.   Patient comes in with upper abdominal pain nausea, vomiting.  Suspect this is related to known gallstones will get labs to evaluate for choledocholithiasis.  Patient given some IV Dilaudid IV Zofran.  Upon reassessment patient appears much more comfortable.  I did review he had prior  CT imaging has been negative for any aneurysm and his pain he states is very similar to when he had the issues with his gallbladder back in March.  I did get an EKG, cardiac marker just to ensure no evidence of ACS.  CBC shows slightly elevated white count.  CMP showed elevated LFTs and T. bili.  Troponin was negative.  On repeat evaluation patient was feeling better.  Ultrasound concerning for gallstones.  Discussed with Dr. Tobi Bastos from GI due to my concern for recurrent choledocholithiasis however Dr. Servando Snare is not available tomorrow for ERCP so would recommend getting an MRCP and if positive then transferring to [facility].  Discussed this with family who understand.  When I went to reevaluate patient he is very tender in his lower abdomen as well and immediately tries to push my hand away.  He continues deny any chest pain, shortness of breath or headaches.  We will get a repeat CT imaging just to ensure there is nothing else going on given how tender he is throughout his abdomen.  Patient will be handed off pending this imaging  The patient is on the cardiac monitor to evaluate for evidence of arrhythmia and/or significant heart rate changes.      FINAL CLINICAL IMPRESSION(S) / ED DIAGNOSES   Final diagnoses:  Abdominal pain, unspecified abdominal location     Rx / DC Orders   ED Discharge Orders     None        Note:  This document was prepared using Dragon voice recognition software and may include unintentional dictation errors.   Concha Se, MD 11/16/22 2259

## 2022-11-16 NOTE — ED Notes (Signed)
Ultrasound at bedside completing exam

## 2022-11-16 NOTE — ED Triage Notes (Addendum)
Pt to triage via wheelchair.  Pt has n/v for 1 day.  Pt states he was told 1 month ago he has gallbladder problems.  Pt has appt to see dr Trisha Mangle this week, but sx are worse with pain and vomiting.   Pt vomiting in triage, pale.  Pt taken to room 1.

## 2022-11-17 ENCOUNTER — Encounter (HOSPITAL_COMMUNITY): Payer: Self-pay | Admitting: Internal Medicine

## 2022-11-17 ENCOUNTER — Encounter (HOSPITAL_COMMUNITY): Payer: Self-pay

## 2022-11-17 ENCOUNTER — Inpatient Hospital Stay (HOSPITAL_COMMUNITY)
Admit: 2022-11-17 | Discharge: 2022-12-10 | DRG: 417 | Disposition: A | Discharge status: Home / Self Care | Payer: HMO | Source: Other Acute Inpatient Hospital | Attending: Internal Medicine | Admitting: Internal Medicine

## 2022-11-17 DIAGNOSIS — D638 Anemia in other chronic diseases classified elsewhere: Secondary | ICD-10-CM | POA: Diagnosis present

## 2022-11-17 DIAGNOSIS — I2510 Atherosclerotic heart disease of native coronary artery without angina pectoris: Secondary | ICD-10-CM | POA: Diagnosis present

## 2022-11-17 DIAGNOSIS — R7989 Other specified abnormal findings of blood chemistry: Secondary | ICD-10-CM | POA: Diagnosis not present

## 2022-11-17 DIAGNOSIS — Z9842 Cataract extraction status, left eye: Secondary | ICD-10-CM

## 2022-11-17 DIAGNOSIS — R1084 Generalized abdominal pain: Secondary | ICD-10-CM | POA: Diagnosis present

## 2022-11-17 DIAGNOSIS — I700 Atherosclerosis of aorta: Secondary | ICD-10-CM | POA: Diagnosis present

## 2022-11-17 DIAGNOSIS — R6881 Early satiety: Secondary | ICD-10-CM | POA: Diagnosis not present

## 2022-11-17 DIAGNOSIS — K8591 Acute pancreatitis with uninfected necrosis, unspecified: Secondary | ICD-10-CM | POA: Diagnosis not present

## 2022-11-17 DIAGNOSIS — Z87442 Personal history of urinary calculi: Secondary | ICD-10-CM

## 2022-11-17 DIAGNOSIS — R609 Edema, unspecified: Secondary | ICD-10-CM | POA: Diagnosis not present

## 2022-11-17 DIAGNOSIS — K8010 Calculus of gallbladder with chronic cholecystitis without obstruction: Secondary | ICD-10-CM | POA: Diagnosis not present

## 2022-11-17 DIAGNOSIS — E46 Unspecified protein-calorie malnutrition: Secondary | ICD-10-CM | POA: Diagnosis not present

## 2022-11-17 DIAGNOSIS — K219 Gastro-esophageal reflux disease without esophagitis: Secondary | ICD-10-CM | POA: Diagnosis present

## 2022-11-17 DIAGNOSIS — N289 Disorder of kidney and ureter, unspecified: Secondary | ICD-10-CM | POA: Diagnosis not present

## 2022-11-17 DIAGNOSIS — K8511 Biliary acute pancreatitis with uninfected necrosis: Secondary | ICD-10-CM | POA: Diagnosis not present

## 2022-11-17 DIAGNOSIS — R112 Nausea with vomiting, unspecified: Secondary | ICD-10-CM | POA: Diagnosis not present

## 2022-11-17 DIAGNOSIS — K8020 Calculus of gallbladder without cholecystitis without obstruction: Secondary | ICD-10-CM | POA: Diagnosis not present

## 2022-11-17 DIAGNOSIS — Z539 Procedure and treatment not carried out, unspecified reason: Secondary | ICD-10-CM | POA: Diagnosis not present

## 2022-11-17 DIAGNOSIS — K7460 Unspecified cirrhosis of liver: Secondary | ICD-10-CM | POA: Diagnosis not present

## 2022-11-17 DIAGNOSIS — K8051 Calculus of bile duct without cholangitis or cholecystitis with obstruction: Secondary | ICD-10-CM | POA: Diagnosis not present

## 2022-11-17 DIAGNOSIS — E871 Hypo-osmolality and hyponatremia: Secondary | ICD-10-CM | POA: Diagnosis present

## 2022-11-17 DIAGNOSIS — E782 Mixed hyperlipidemia: Secondary | ICD-10-CM | POA: Diagnosis present

## 2022-11-17 DIAGNOSIS — K8050 Calculus of bile duct without cholangitis or cholecystitis without obstruction: Secondary | ICD-10-CM | POA: Diagnosis present

## 2022-11-17 DIAGNOSIS — E876 Hypokalemia: Secondary | ICD-10-CM | POA: Diagnosis not present

## 2022-11-17 DIAGNOSIS — R197 Diarrhea, unspecified: Secondary | ICD-10-CM | POA: Diagnosis not present

## 2022-11-17 DIAGNOSIS — Z9889 Other specified postprocedural states: Secondary | ICD-10-CM

## 2022-11-17 DIAGNOSIS — K851 Biliary acute pancreatitis without necrosis or infection: Secondary | ICD-10-CM | POA: Diagnosis not present

## 2022-11-17 DIAGNOSIS — K5730 Diverticulosis of large intestine without perforation or abscess without bleeding: Secondary | ICD-10-CM | POA: Diagnosis present

## 2022-11-17 DIAGNOSIS — E440 Moderate protein-calorie malnutrition: Secondary | ICD-10-CM | POA: Diagnosis not present

## 2022-11-17 DIAGNOSIS — E8809 Other disorders of plasma-protein metabolism, not elsewhere classified: Secondary | ICD-10-CM | POA: Diagnosis present

## 2022-11-17 DIAGNOSIS — K2970 Gastritis, unspecified, without bleeding: Secondary | ICD-10-CM | POA: Diagnosis not present

## 2022-11-17 DIAGNOSIS — Z79899 Other long term (current) drug therapy: Secondary | ICD-10-CM

## 2022-11-17 DIAGNOSIS — E785 Hyperlipidemia, unspecified: Secondary | ICD-10-CM | POA: Diagnosis not present

## 2022-11-17 DIAGNOSIS — K831 Obstruction of bile duct: Secondary | ICD-10-CM | POA: Diagnosis not present

## 2022-11-17 DIAGNOSIS — R911 Solitary pulmonary nodule: Secondary | ICD-10-CM | POA: Diagnosis present

## 2022-11-17 DIAGNOSIS — K859 Acute pancreatitis without necrosis or infection, unspecified: Secondary | ICD-10-CM | POA: Diagnosis not present

## 2022-11-17 DIAGNOSIS — J9811 Atelectasis: Secondary | ICD-10-CM | POA: Diagnosis not present

## 2022-11-17 DIAGNOSIS — D72829 Elevated white blood cell count, unspecified: Secondary | ICD-10-CM | POA: Diagnosis not present

## 2022-11-17 DIAGNOSIS — Z85828 Personal history of other malignant neoplasm of skin: Secondary | ICD-10-CM | POA: Diagnosis not present

## 2022-11-17 DIAGNOSIS — E1151 Type 2 diabetes mellitus with diabetic peripheral angiopathy without gangrene: Secondary | ICD-10-CM | POA: Diagnosis not present

## 2022-11-17 DIAGNOSIS — R1010 Upper abdominal pain, unspecified: Secondary | ICD-10-CM | POA: Diagnosis not present

## 2022-11-17 DIAGNOSIS — K811 Chronic cholecystitis: Secondary | ICD-10-CM | POA: Diagnosis not present

## 2022-11-17 DIAGNOSIS — Z955 Presence of coronary angioplasty implant and graft: Secondary | ICD-10-CM | POA: Diagnosis not present

## 2022-11-17 DIAGNOSIS — I252 Old myocardial infarction: Secondary | ICD-10-CM | POA: Diagnosis not present

## 2022-11-17 DIAGNOSIS — I480 Paroxysmal atrial fibrillation: Secondary | ICD-10-CM | POA: Diagnosis present

## 2022-11-17 DIAGNOSIS — K3189 Other diseases of stomach and duodenum: Secondary | ICD-10-CM | POA: Diagnosis present

## 2022-11-17 DIAGNOSIS — K5903 Drug induced constipation: Secondary | ICD-10-CM | POA: Diagnosis not present

## 2022-11-17 DIAGNOSIS — E875 Hyperkalemia: Secondary | ICD-10-CM | POA: Diagnosis present

## 2022-11-17 DIAGNOSIS — Z6823 Body mass index (BMI) 23.0-23.9, adult: Secondary | ICD-10-CM | POA: Diagnosis not present

## 2022-11-17 DIAGNOSIS — R0902 Hypoxemia: Secondary | ICD-10-CM | POA: Diagnosis present

## 2022-11-17 DIAGNOSIS — I10 Essential (primary) hypertension: Secondary | ICD-10-CM | POA: Diagnosis present

## 2022-11-17 DIAGNOSIS — R159 Full incontinence of feces: Secondary | ICD-10-CM | POA: Diagnosis not present

## 2022-11-17 DIAGNOSIS — T40605A Adverse effect of unspecified narcotics, initial encounter: Secondary | ICD-10-CM | POA: Diagnosis not present

## 2022-11-17 DIAGNOSIS — K529 Noninfective gastroenteritis and colitis, unspecified: Secondary | ICD-10-CM | POA: Diagnosis present

## 2022-11-17 DIAGNOSIS — R935 Abnormal findings on diagnostic imaging of other abdominal regions, including retroperitoneum: Secondary | ICD-10-CM | POA: Diagnosis not present

## 2022-11-17 DIAGNOSIS — J690 Pneumonitis due to inhalation of food and vomit: Secondary | ICD-10-CM | POA: Diagnosis present

## 2022-11-17 DIAGNOSIS — K5900 Constipation, unspecified: Secondary | ICD-10-CM | POA: Diagnosis not present

## 2022-11-17 DIAGNOSIS — I4891 Unspecified atrial fibrillation: Secondary | ICD-10-CM | POA: Diagnosis not present

## 2022-11-17 DIAGNOSIS — K8592 Acute pancreatitis with infected necrosis, unspecified: Secondary | ICD-10-CM | POA: Diagnosis not present

## 2022-11-17 DIAGNOSIS — Z4659 Encounter for fitting and adjustment of other gastrointestinal appliance and device: Secondary | ICD-10-CM | POA: Diagnosis not present

## 2022-11-17 DIAGNOSIS — E119 Type 2 diabetes mellitus without complications: Secondary | ICD-10-CM | POA: Diagnosis present

## 2022-11-17 DIAGNOSIS — Z961 Presence of intraocular lens: Secondary | ICD-10-CM | POA: Diagnosis present

## 2022-11-17 DIAGNOSIS — Z7982 Long term (current) use of aspirin: Secondary | ICD-10-CM

## 2022-11-17 DIAGNOSIS — Z9841 Cataract extraction status, right eye: Secondary | ICD-10-CM

## 2022-11-17 DIAGNOSIS — K8689 Other specified diseases of pancreas: Secondary | ICD-10-CM | POA: Diagnosis not present

## 2022-11-17 DIAGNOSIS — R0602 Shortness of breath: Secondary | ICD-10-CM | POA: Diagnosis not present

## 2022-11-17 DIAGNOSIS — R21 Rash and other nonspecific skin eruption: Secondary | ICD-10-CM | POA: Diagnosis not present

## 2022-11-17 LAB — URINALYSIS, ROUTINE W REFLEX MICROSCOPIC
Bacteria, UA: NONE SEEN
Bilirubin Urine: NEGATIVE
Glucose, UA: NEGATIVE mg/dL
Hgb urine dipstick: NEGATIVE
Ketones, ur: NEGATIVE mg/dL
Leukocytes,Ua: NEGATIVE
Nitrite: NEGATIVE
Protein, ur: 30 mg/dL — AB
Specific Gravity, Urine: 1.016 (ref 1.005–1.030)
Squamous Epithelial / HPF: NONE SEEN /HPF (ref 0–5)
WBC, UA: NONE SEEN WBC/hpf (ref 0–5)
pH: 7 (ref 5.0–8.0)

## 2022-11-17 LAB — CBC WITH DIFFERENTIAL/PLATELET
Abs Immature Granulocytes: 0.12 10*3/uL — ABNORMAL HIGH (ref 0.00–0.07)
Basophils Absolute: 0 10*3/uL (ref 0.0–0.1)
Basophils Relative: 0 %
Eosinophils Absolute: 0 10*3/uL (ref 0.0–0.5)
Eosinophils Relative: 0 %
HCT: 50 % (ref 39.0–52.0)
Hemoglobin: 16.4 g/dL (ref 13.0–17.0)
Immature Granulocytes: 1 %
Lymphocytes Relative: 13 %
Lymphs Abs: 2.7 10*3/uL (ref 0.7–4.0)
MCH: 28.9 pg (ref 26.0–34.0)
MCHC: 32.8 g/dL (ref 30.0–36.0)
MCV: 88 fL (ref 80.0–100.0)
Monocytes Absolute: 1.3 10*3/uL — ABNORMAL HIGH (ref 0.1–1.0)
Monocytes Relative: 6 %
Neutro Abs: 16.9 10*3/uL — ABNORMAL HIGH (ref 1.7–7.7)
Neutrophils Relative %: 80 %
Platelets: 206 10*3/uL (ref 150–400)
RBC: 5.68 MIL/uL (ref 4.22–5.81)
RDW: 16 % — ABNORMAL HIGH (ref 11.5–15.5)
WBC: 21.1 10*3/uL — ABNORMAL HIGH (ref 4.0–10.5)
nRBC: 0 % (ref 0.0–0.2)

## 2022-11-17 LAB — MAGNESIUM: Magnesium: 2.1 mg/dL (ref 1.7–2.4)

## 2022-11-17 LAB — COMPREHENSIVE METABOLIC PANEL
ALT: 207 U/L — ABNORMAL HIGH (ref 0–44)
AST: 114 U/L — ABNORMAL HIGH (ref 15–41)
Albumin: 3.8 g/dL (ref 3.5–5.0)
Alkaline Phosphatase: 139 U/L — ABNORMAL HIGH (ref 38–126)
Anion gap: 14 (ref 5–15)
BUN: 23 mg/dL (ref 8–23)
CO2: 26 mmol/L (ref 22–32)
Calcium: 8.9 mg/dL (ref 8.9–10.3)
Chloride: 101 mmol/L (ref 98–111)
Creatinine, Ser: 1.08 mg/dL (ref 0.61–1.24)
GFR, Estimated: >60 mL/min (ref >60)
Glucose, Bld: 174 mg/dL — ABNORMAL HIGH (ref 70–99)
Potassium: 5.3 mmol/L — ABNORMAL HIGH (ref 3.5–5.1)
Sodium: 141 mmol/L (ref 135–145)
Total Bilirubin: 1.7 mg/dL — ABNORMAL HIGH (ref 0.3–1.2)
Total Protein: 7.5 g/dL (ref 6.5–8.1)

## 2022-11-17 LAB — TROPONIN I (HIGH SENSITIVITY): Troponin I (High Sensitivity): 7 ng/L (ref <18)

## 2022-11-17 LAB — LIPASE, BLOOD: Lipase: 524 U/L — ABNORMAL HIGH (ref 11–51)

## 2022-11-17 LAB — PROTIME-INR
INR: 1.1 (ref 0.8–1.2)
Prothrombin Time: 14.8 seconds (ref 11.4–15.2)

## 2022-11-17 MED ORDER — METHOCARBAMOL 1000 MG/10ML IJ SOLN
500.0000 mg | Freq: Four times a day (QID) | INTRAVENOUS | Status: DC | PRN
  Filled 2022-11-17: qty 5

## 2022-11-17 MED ORDER — ACETAMINOPHEN 650 MG RE SUPP: 650.0000 mg | Freq: Four times a day (QID) | RECTAL | Status: DC | PRN

## 2022-11-17 MED ORDER — ONDANSETRON HCL 4 MG/2ML IJ SOLN
4.0000 mg | Freq: Once | INTRAMUSCULAR | Status: AC
  Administered 2022-11-17: 4 mg via INTRAVENOUS
  Filled 2022-11-17: qty 2

## 2022-11-17 MED ORDER — PIPERACILLIN-TAZOBACTAM 3.375 G IVPB 30 MIN
3.3750 g | Freq: Once | INTRAVENOUS | Status: AC
  Administered 2022-11-17: 3.375 g via INTRAVENOUS
  Filled 2022-11-17 (×2): qty 50

## 2022-11-17 MED ORDER — ONDANSETRON HCL 4 MG/2ML IJ SOLN
4.0000 mg | Freq: Four times a day (QID) | INTRAMUSCULAR | Status: DC | PRN
  Administered 2022-11-19: 4 mg via INTRAVENOUS
  Filled 2022-11-17: qty 2

## 2022-11-17 MED ORDER — HYDROMORPHONE HCL 1 MG/ML IJ SOLN
0.5000 mg | INTRAMUSCULAR | Status: DC | PRN
  Administered 2022-11-17 – 2022-11-22 (×50): 0.5 mg via INTRAVENOUS
  Filled 2022-11-17 (×50): qty 0.5

## 2022-11-17 MED ORDER — HYDROMORPHONE HCL 1 MG/ML IJ SOLN
1.0000 mg | INTRAMUSCULAR | Status: DC | PRN
  Administered 2022-11-17 (×3): 1 mg via INTRAVENOUS
  Filled 2022-11-17 (×3): qty 1

## 2022-11-17 MED ORDER — BISACODYL 10 MG RE SUPP
10.0000 mg | Freq: Every day | RECTAL | Status: DC | PRN
  Administered 2022-11-20 – 2022-11-28 (×4): 10 mg via RECTAL
  Filled 2022-11-17 (×5): qty 1

## 2022-11-17 MED ORDER — PIPERACILLIN-TAZOBACTAM 3.375 G IVPB 30 MIN
3.3750 g | Freq: Once | INTRAVENOUS | Status: AC
  Administered 2022-11-17: 3.375 g via INTRAVENOUS
  Filled 2022-11-17: qty 50

## 2022-11-17 MED ORDER — HYDROMORPHONE HCL 1 MG/ML IJ SOLN
0.5000 mg | Freq: Once | INTRAMUSCULAR | Status: AC
  Administered 2022-11-17: 0.5 mg via INTRAVENOUS
  Filled 2022-11-17: qty 0.5

## 2022-11-17 MED ORDER — ACETAMINOPHEN 325 MG PO TABS: 650.0000 mg | ORAL_TABLET | Freq: Four times a day (QID) | ORAL | Status: DC | PRN

## 2022-11-17 MED ORDER — PANTOPRAZOLE SODIUM 40 MG IV SOLR
40.0000 mg | INTRAVENOUS | Status: DC
  Administered 2022-11-17 – 2022-12-06 (×19): 40 mg via INTRAVENOUS
  Filled 2022-11-17 (×19): qty 10

## 2022-11-17 MED ORDER — LACTATED RINGERS IV SOLN: INTRAVENOUS | Status: DC

## 2022-11-17 MED ORDER — LACTATED RINGERS IV BOLUS
1000.0000 mL | Freq: Once | INTRAVENOUS | Status: AC
  Administered 2022-11-17: 1000 mL via INTRAVENOUS

## 2022-11-17 MED ORDER — PIPERACILLIN-TAZOBACTAM 3.375 G IVPB
3.3750 g | Freq: Three times a day (TID) | INTRAVENOUS | Status: DC
  Administered 2022-11-18 – 2022-11-24 (×19): 3.375 g via INTRAVENOUS
  Filled 2022-11-17 (×19): qty 50

## 2022-11-17 MED ORDER — ONDANSETRON HCL 4 MG PO TABS: 4.0000 mg | ORAL_TABLET | Freq: Four times a day (QID) | ORAL | Status: DC | PRN

## 2022-11-17 NOTE — Progress Notes (Signed)
Pharmacy Antibiotic Note  Jon Bray is a 82 y.o. male admitted on 11/17/2022 with  intra-abdominal infection .  Pharmacy has been consulted for zosyn dosing. WBC 12.8, afeb.  MRCP (5/14): acute pancreatitis without complicating features, choledocholithiasis w/ 4mm stone .   Plan: Zosyn 3.375g IV q 8h  F/u clinical picture, imaging, LOT , surg plans    Temp (24hrs), Avg:97.3 F (36.3 C), Min:96.1 F (35.6 C), Max:97.9 F (36.6 C)  Recent Labs  Lab 11/16/22 2051  WBC 12.8*  CREATININE 0.78    Estimated Creatinine Clearance: 67.7 mL/min (by C-G formula based on SCr of 0.78 mg/dL).    No Known Allergies  Antimicrobials this admission: Zosyn 5/15>   Dose adjustments this admission:   Microbiology results:   Thank you for allowing pharmacy to be a part of this patient's care.  Calton Dach, PharmD Clinical Pharmacist 11/17/2022 5:52 PM

## 2022-11-17 NOTE — H&P (Signed)
History and Physical  Patient: Jon Bray ZOX:[id number] DOB: 13-Dec-1940 DOA: 11/17/2022 DOS: the patient was seen and examined on 11/17/2022 Patient coming from: Home  Chief Complaint: Abdominal pain and nausea HPI: Jon Bray is a 82 y.o. male with PMH significant of CAD 2014. Present to the hospital with complaints of abdominal pain and nausea and vomiting ongoing for last 3 days. Originally seen at [facility] ER on 5/14.  Reportedly had issues with his gallbladder ongoing for last 1 month.  Has seen general surgery in April and was recommended to undergo cholecystectomy. Abdominal pain is reported at across his stomach.  At the time of my evaluation 7 out of 10.  Feels like a continuous burning pain.  Had multiple episodes of vomiting prior to coming to the hospital with some brownish tinge. No fever or chills reported by the patient. Reports heavy breathing secondary to pain in the abdomen. Had similar episodes in the past with this gallbladder issue but his current presentation is 3-5 times worse than his prior episodes. In the ED was evaluated by ED provider.  They discussed with [facility].  No ERCP provider was on-call and therefore after discussion between [facility] hospitalist, GI as well as ED provider decision was made to transfer the patient to [facility] for GI evaluation. Denies any headache or tingling or numbness. No recent change in medications reported.  Review of Systems: As mentioned in the history of present illness. All other systems reviewed and are negative.  Prior to Admission medications   Medication Sig Start Date End Date Taking? Authorizing Provider  aspirin 81 MG chewable tablet Chew 81 mg by mouth daily.    [provider]  bisacodyl (DULCOLAX) 5 MG EC tablet Take 5 mg by mouth daily as needed for moderate constipation. Patient not taking: Reported on 03/09/2022    [provider]  ferrous sulfate 325 (65 FE) MG tablet Take 325 mg by mouth  daily with breakfast. Patient not taking: Reported on 03/09/2022    [provider]  HYDROcodone-acetaminophen (NORCO/VICODIN) 5-325 MG tablet Take 1 tablet by mouth every 4 (four) hours as needed for moderate pain. Patient not taking: Reported on 03/09/2022 11/27/21 11/27/22  Earline Mayotte, MD  Lactobacillus (PROBIOTIC ACIDOPHILUS PO) Take by mouth.    [provider]  Multiple Vitamin (MULTIVITAMIN) tablet Take 1 tablet by mouth daily.    [provider]  Omega-3 Fatty Acids (FISH OIL) 1000 MG CAPS Take 1 capsule by mouth. Patient not taking: Reported on 03/09/2022    [provider]  vitamin B-12 (CYANOCOBALAMIN) 1000 MCG tablet Take 1,000 mcg by mouth daily at 12 noon. Patient not taking: Reported on 03/09/2022    [provider]  Zinc Sulfate (ZINC-220 PO) Take by mouth.    [provider]    Past Medical History:  Diagnosis Date   Anginal pain (HCC)    Aortic atherosclerosis (HCC)    Aortic root enlargement (HCC) 12/27/2017   a.) cCTA 12/27/2017: measured 3.9 cm.   Basal cell carcinoma of skin    a.) s/p Mohs procedure R/L nasal ala 01/27/2015   CAD (coronary artery disease) 05/12/2013   a.) STEMI --> LHC 05/12/2013: 20% p-mLAD, 20% D2, 40% LPDA, 30% LPAV, 99% dLCx --> PCI placing 4.5 x 20 mm and 2.5 x 12 mm Veriflex BMS to LPAV extending to dLCx. b.) cCTA 12/27/2017: Ca score 1578; 82nd percentile for age/sex match control; CT-FFR: RCA 0.99, pLAD 0.97, mLAD 0.98,  dLAD 0.89, pLCx 0.98, mLCx 0.97, dLCx 0.97, PLB 0.91, PDA (most distal segment) 0.80.   Cataract    a.) s/p extraction with IOL placement   CKD (chronic kidney disease)    Diverticulosis    GERD (gastroesophageal reflux disease)    History of kidney stones 2023   HLD (hyperlipidemia)    Left lower lobe pulmonary nodule 08/01/2021   a.) CT 08/01/2021: measured 4 mm; perifissural   Long term current use of anticoagulant    a.) apixaban   NSVT (nonsustained ventricular  tachycardia) (HCC) 05/13/2013   a.) 24 beat run while in ICU following PCI for STEMI   PAF (paroxysmal atrial fibrillation) (HCC) 11/16/2021   a.) new onset in ED on 11/16/2021. b.) CHA2DS2-VASc = 4 (age x 2, STEMI/aortic plaque, T2DM). c.) rate controlled without pharmacological intervention; started on apixaban 11/16/2021, however PCP discontinued on 11/18/2021 pending Holter study results.   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   Type 2 diabetes, diet controlled (HCC)    Past Surgical History:  Procedure Laterality Date   CATARACT EXTRACTION W/ INTRAOCULAR LENS IMPLANT Bilateral    COLONOSCOPY N/A 01/31/2001   COLONOSCOPY N/A 11/19/2008   CORONARY ANGIOPLASTY WITH STENT PLACEMENT Left 05/12/2013   Procedure: CORONARY ANGIOPLASTY WITH STENT PLACEMENT; Location: [facility]; Surgeon: Lieutenant Diego, MD   ESOPHAGOGASTRODUODENOSCOPY N/A 03/03/2000   ESOPHAGOGASTRODUODENOSCOPY (EGD) WITH PROPOFOL N/A 10/16/2021   Procedure: ESOPHAGOGASTRODUODENOSCOPY (EGD) WITH PROPOFOL;  Surgeon: Regis Bill, MD;  Location: [facility];  Service: Endoscopy;  Laterality: N/A;   EXTRACORPOREAL SHOCK WAVE LITHOTRIPSY Left 08/06/2021   Procedure: EXTRACORPOREAL SHOCK WAVE LITHOTRIPSY (ESWL);  Surgeon: Vanna Scotland, MD;  Location: [facility];  Service: Urology;  Laterality: Left;   INGUINAL HERNIA REPAIR Right 11/27/2021   Procedure: HERNIA REPAIR INGUINAL ADULT;  Surgeon: Earline Mayotte, MD;  Location: [facility];  Service: General;  Laterality: Right;   INTRAOCULAR LENS EXCHANGE Left 12/27/2018   Procedure: MECHANICAL VITRECTOMY,  EXCHANGE LENS PROSTHESIS VIA PARS PLANA APPROACH; Location: [facility]; Surgeon: Nicholaus Corolla, MD   MOHS SURGERY Bilateral 01/27/2015   Procedure: MOHS SURGERY TO  REMOVE BASAL CELL CARCINOMA FROM RIGHT/LEFT NASAL ALA; Location: [facility]; Surgeon: Katrine Coho, MD   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   Procedure: RPR RETINAL DTCHMNT W/VITRECTOMY ANY METH REPAIR RETINAL DETACHMENT; WITH VITRECTOMY, INC, WHEN PERFORMED, AIR/GAS TAMPONADE, FOCAL ENDOLASER PHOTOCOAGULATION, CRYOTHERAPY, DRAINAGE OF SUBRETINAL FLUID, SCLERAL BUCKLING AND/OR REMOVAL OF LENS; Location: [facility]; Surgeon: Nicholaus Corolla, MD   SINUSOTOMY N/A    Procedure: MAXILLARY SINUSOTOMY INTRANASAL   TONSILLECTOMY AND ADENOIDECTOMY N/A    Social History:  reports that he has never smoked. He has never used smokeless tobacco. He reports that he does not drink alcohol and does not use drugs. No Known Allergies History reviewed. No pertinent family history. Physical Exam: Vitals:   11/17/22 1833  BP: (!) 165/85  Pulse: 69  Resp: 20  Temp: 97.7 F (36.5 C)  TempSrc: Oral  Weight: 76.2 kg  Height: 5\' 8"  (1.727 m)   General: Appear in mild distress; no visible Abnormal Neck Mass Or lumps, Conjunctiva normal Cardiovascular: S1 and S2 Present, no Murmur, Respiratory: good respiratory effort, Bilateral Air entry present and CTA, no Crackles, no wheezes Abdomen: Bowel Sound present, diffuse tenderness  present Extremities: no Pedal edema Neurology: alert and oriented to time, place, and person Gait not checked due to patient safety concerns   Data Reviewed: I have reviewed ED notes, Vitals, Lab results and outpatient records. Since last encounter, pertinent lab results CBC and BMP   . I have ordered test including BMP and CBC  . I have discussed pt's care plan and test results with [facility]  .   Assessment and Plan Acute gallstone pancreatitis Concern for cholangitis. Leukocytosis. Presents with complaints of abdominal pain. CT abdomen shows evidence of pancreatitis. Lipase level more than detectable range more than 10,000. Also has mild leukocytosis.  LFTs are 200. Bilirubin  2.5. Received IV Zosyn in the ER.  After discussion with GI we will continue IV Zosyn for now. I discussed with [facility]. The evaluated the patient in the morning. Will provide currently IV LR bolus followed by IV LR at 150 mL/h. Recheck the labs. SCDs for DVT prophylaxis. N.p.o. for now. IV Zofran and IV Dilaudid for pain control and nausea control. After GI has evaluated patient and performed ERCP patient most likely will also require general surgery consultation for cholecystectomy.  CAD (coronary artery disease) History in 2014. Currently no anginal symptoms. Patient actually have been fairly active without any anginal symptoms On exertion. Currently holding aspirin.  GERD (gastroesophageal reflux disease) Reported brown-colored emesis. Will provide IV Protonix. Monitor.  Hyperlipidemia, mixed Holding statin.  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 Will check hemoglobin A1c.  Mild hypokalemia. Replaced. Will recheck.  Advance Care Planning:   Code Status: Full Code discussed with patient and family at bedside. Consults: [facility] Family Communication: Family at bedside  Author: Lynden Oxford, MD 11/17/2022 7:00 PM For on call review [email or web address].

## 2022-11-17 NOTE — ED Provider Notes (Addendum)
Patient received in signout from Dr. Fuller Plan pending MRCP to evaluate for choledocholithiasis.  This eventually returns with evidence of choledocholithiasis.  I speak with GI physician overnight Dr. Tobi Bastos reports that our interventionalists Dr. Servando Snare is not available today (Wednesday) but will be available tomorrow on Thursday.  Reports concern that patient will not have any "ownership" if he were to worsen and develop ascending cholangitis or sepsis and would need an emergent ERCP in that situation, he is therefore recommending transfer to another facility.  I consult with Dr. Leonides Schanz, overnight GI on-call at [facility], who reports that they likely will not be able to get to the patient today (Wednesday) due to a fairly full schedule and it probably would not even happen until Thursday.  She recommends considering keeping the patient at our facility for continued medical management and we can attempt transfer later if he were to clinically decompensate, and patient can undergo ERCP at our facility tomorrow.  I consult with Dr. Tobi Bastos again, as well as hospitalist Dr. Clyde Lundborg and Dr. Para March, who expressed discomfort with keeping the patient here and they recommend I consult with hospitalist at [facility] for transfer for continued medical management and patient could be in the appropriate physical location if he were to worsen to be available for interventionalist.   I consult with hospitalist at [facility] who accepts for transfer. Jerl Mina, MD 11/17/22 [id number]    Delton Prairie, MD 11/17/22 [phone]

## 2022-11-18 DIAGNOSIS — K8050 Calculus of bile duct without cholangitis or cholecystitis without obstruction: Secondary | ICD-10-CM | POA: Diagnosis not present

## 2022-11-18 DIAGNOSIS — D72829 Elevated white blood cell count, unspecified: Secondary | ICD-10-CM

## 2022-11-18 DIAGNOSIS — K851 Biliary acute pancreatitis without necrosis or infection: Secondary | ICD-10-CM | POA: Diagnosis not present

## 2022-11-18 LAB — COMPREHENSIVE METABOLIC PANEL
ALT: 154 U/L — ABNORMAL HIGH (ref 0–44)
AST: 75 U/L — ABNORMAL HIGH (ref 15–41)
Albumin: 3.3 g/dL — ABNORMAL LOW (ref 3.5–5.0)
Alkaline Phosphatase: 110 U/L (ref 38–126)
Anion gap: 10 (ref 5–15)
BUN: 24 mg/dL — ABNORMAL HIGH (ref 8–23)
CO2: 27 mmol/L (ref 22–32)
Calcium: 8.2 mg/dL — ABNORMAL LOW (ref 8.9–10.3)
Chloride: 100 mmol/L (ref 98–111)
Creatinine, Ser: 1.11 mg/dL (ref 0.61–1.24)
GFR, Estimated: >60 mL/min (ref >60)
Glucose, Bld: 153 mg/dL — ABNORMAL HIGH (ref 70–99)
Potassium: 4.7 mmol/L (ref 3.5–5.1)
Sodium: 137 mmol/L (ref 135–145)
Total Bilirubin: 2.1 mg/dL — ABNORMAL HIGH (ref 0.3–1.2)
Total Protein: 6.8 g/dL (ref 6.5–8.1)

## 2022-11-18 LAB — CBC
HCT: 47.1 % (ref 39.0–52.0)
Hemoglobin: 15.3 g/dL (ref 13.0–17.0)
MCH: 28.8 pg (ref 26.0–34.0)
MCHC: 32.5 g/dL (ref 30.0–36.0)
MCV: 88.5 fL (ref 80.0–100.0)
Platelets: 194 10*3/uL (ref 150–400)
RBC: 5.32 MIL/uL (ref 4.22–5.81)
RDW: 16.1 % — ABNORMAL HIGH (ref 11.5–15.5)
WBC: 22 10*3/uL — ABNORMAL HIGH (ref 4.0–10.5)
nRBC: 0 % (ref 0.0–0.2)

## 2022-11-18 LAB — LIPASE, BLOOD: Lipase: 385 U/L — ABNORMAL HIGH (ref 11–51)

## 2022-11-18 MED ORDER — LACTATED RINGERS IV BOLUS
500.0000 mL | Freq: Once | INTRAVENOUS | Status: AC
  Administered 2022-11-18: 500 mL via INTRAVENOUS

## 2022-11-18 NOTE — Hospital Course (Addendum)
Jon Bray is a 82 y.o. male with PMH significant for DM2, HTN, HLD, CAD/NSTEMI/stent 2014, paroxysmal A-fib 5/14, patient was brought to the ED at [facility] with complaint of worsening nausea, vomiting.   In March, patient saw his primary care provider for loss of appetite, weight loss. Workup showed elevated liver enzymes and bilirubin.  3/27 CT abdomen and pelvis showed focal wall thickening in the urinary bladder adjacent to right UVJ suspicious for neoplasm, also extensive sigmoid diverticulosis 3/28, MRI/MRCP showed choledocholithiasis with 2 tiny gallstones near the ampulla measuring 0.2 to 0.3 cm and mild intra and extrahepatic biliary duct dilatation, CBD dilated to 0.9 cm Patient refused to go to ED for urgent evaluation.  PCP sent referral to general surgery and GI 4/9, seen by surgeon Dr. Trisha Mangle.  Cholecystectomy was recommended but patient wanted to wait it out.   5/14, patient was brought to the ED for 3 days of nausea, vomiting. Initial workup showed lipase elevated over 10,000, LFTs elevated as well MRI abdomen showed acute interstitial pancreatitis, choledocholithiasis with 4 mm stone in distal CBD. He was started on conservative management with IV fluid, IV analgesia, IV antiemetics, bowel rest Admitted to [facility] GI and general surgery were consulted. 5/20, ERCP was attempted but had to be aborted due to inability to identify the papula.  Follow-up MRCP showed persistent small stone in the CBD, changes of pancreatitis along with small fluid collection. Overall, liver enzymes continue to improve, Underwent ERCP and Stone Extractionby Dr. Meridee Score (12/02/22).  Tube feedings were initiated on 11/30/2022 but NG has been removed and Cortrak to be replaced.   **Patient now underwent laparoscopic cholecystectomy today given that he felt he was stable for surgery and because it was clinically felt that his pancreatitis seems to be improving.  Gastroenterology recommends continuing and  placing a core track to allow time for his pancreatitis and cholecystectomy that he also this was placed in 2 feedings were resumed.  He remains on IV Zosyn.  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 -GI and general surgery following -Pancreatitis seems to have improved.  Currently was  to tolerate regular consistency diet and LFTs normalized and bilirubin fairly stable but  Underwent MRCP 11/29/2022 which unfortunately shows worsening pancreatitis with small area of pancreatic necrosis at the head with possibly evolving hemorrhage and enlarging pseudocyst.   -It noted Pancreatic inflammation exerts mass effect on distal CBD, there continues to be 3 mm filling defect in distal CBD suspicious for choledocholithiasis.   -WBC Trend: Recent Labs  Lab 11/29/22 0301 11/30/22 0310 12/01/22 0529 12/02/22 0248 12/03/22 0238 12/04/22 0344 12/05/22 0307  WBC 14.0* 13.2* 11.9* 11.4* 11.1* 12.6* 11.1*  -Underwent ERCP by Dr. Meridee Score today and it showed " J-shaped gastric deformity. Gastritis in antrum. No other gross lesions in the entire stomach. Biopsied for H. pylori. No gross lesions in the duodenal bulb, in the first portion of the duodenum and in the second portion of the duodenum. The major papilla appeared normal (though hidden under multiple folds).The fluoroscopic examination was suspicious for sludge. Choledocholithiasis was found. Complete removal was accomplished by biliary sphincterotomy and balloon sweeping. The very distal biliary duct had a slight narrowing compared to the majority of the bile duct, though this is not clearly a stricture and the balloon could not pass through this area with  ease after stone extraction."   -Patient has been started on NG  tube feedings since 11/30/2022 with Osmolite 1.5 Cal Liquid at 55 mL/hr with Prosource TF20 60 mL/Day and Free Water Flushes 150 mL q4h -Remains on antibiotics per GI and general surgery recommendations with IV Zosyn -GI recommends replacing NG with Cortrak today however cortrak team is not available today.  GI recommending continuing to check liver enzymes continue supportive care along with watching for pancreatitis, cholangitis, perforation and or bleeding -GI recommends continue to monitor supportive patient over the course of the coming days and hopefully he is no evidence of post ERCP pancreatitis or other complications and recommends in the coming days and weeks to consider cholecystectomy pending his overall clinical course from his necrotizing/hemorrhagic pancreatitis; it was felt patient was clinically improved to the point where he cannot undergo surgical intervention until general surgery took him for a laparoscopic cholecystectomy 12/03/22 -Diet advancement per GI and is on a full liquid diet and has Cortrak getting Tube Feedings; from a surgical standpoint and they feel that he is okay to advance diet as tolerated but gastroenterology recommends continuing tube feedings and recommending advancing diet from full liquid diet to heart healthy diet and are hoping that we can advance his diet by Sunday to allow to feedings to be titrated down; patient was able to tolerate oatmeal today and is doing better -Further management and care per GI and general surgery and anticipating discharging once he has been deemed stable and able to tolerate a diet -GI is planning pancreas protocol CT abdomen on 12/06/2022 to see where things stand in regards to the evolution of his pancreatitis and GI hopeful that he will not need any further endoscopic therapies; GI feels that he will continue to have a protracted course but he is making progress -GI also increasing bowel regimen to try and prevent risk of developing  ileus however he is having much more bowel movements and likely related to tube feedings and his bowel regimen but GI wants to rule out C. difficile and ordering a fecal elastase as well and have adjusted his bowel regimen -GI recommending ESR and CRP tomorrow to follow-up on inflammation better and transitioning to oral Protonix if doing well   Bibasilar Atelectasis -Persistent bibasilar atelectasis noted in chest x-ray done on 5/18 and repeated 5/24.   -But he is asymptomatic and not hypoxic.   -Continue Incentive spirometry and will order Flutter Valve -Repeat chest x-ray in the morning   CAD/NSTEMI/stent 2014 HLD -No anginal symptoms currently. -PTA on aspirin 81 mg daily.  Currently on hold given NPO and getting tube feedings.  Will resume once okayed by GI and general surgery -Not on statin as an outpatient   Paroxysmal A-fib  -Was on aspirin daily which is currently on hold.  -Not on anticoagulation as an outpatient -Remains on Med-Surge but  if necessary will transfer to Telemetry    Type 2 Diabetes mellitus Diet controlled.  Not on meds, A1c 5.8 on 11/19/2022 -CBG Trend: Recent Labs  Lab 12/04/22 1218 12/04/22 1622 12/04/22 1939 12/04/22 2339 12/05/22 0451 12/05/22 0727 12/05/22 1216  GLUCAP 172* 103* 160* 149* 184* 227* 170*    GERD/GI Prophylaxis  -Continue PPI with pantoprazole 40 mg IV every 24h and GI is hopeful to transition to orals within next 24 hours if he is doing well   Bladder Wall Thickening On 3/27, CT abdomen and pelvis showed focal wall thickening in the bladder adjacent to the right UVJ  suspicious for neoplasm.  Radiologist recommended cystoscopy for further evaluation. -Urinalysis 11/16/2022 did not show any evidence of hematuria. -Patient/family have not noticed any hematuria thereafter either. -5/22 Dr. Jacqulyn Bath sent an epic message to on-call urology Dr. McDiarmid, recommended outpatient follow-up for further workup including cystoscopy.     Extensive Sigmoid Diverticulosis  Chronic Constipation -Continue Bowel regimen with MiraLAX 17 g p.o. daily as needed and senna docusate 1 tab p.o. nightly but GI has increased this and added 10 mg of bisacodyl rectal suppositories for moderate constipation and scheduled MiraLAX 17 g daily and have now adjusted his bowel regimen given the patient's diarrhea and have discontinued his scheduled MiraLAX   Rash on the back. -Possible associated with newly linen material. -C/w Hydrocortisone Cream 1% Topically 4 times daily -No complaints at this time   Hyponatremia -Na+ Trend: Recent Labs  Lab 12/01/22 0529 12/02/22 0248 12/02/22 0845 12/03/22 0238 12/03/22 1222 12/04/22 0344 12/05/22 0307  NA 131* 135 134* 135 132* 134* 133*  -Continue to Monitor and Trend -Repeat CMP in the AM   Normocytic Anemia -Hgb/Hct Trend: Recent Labs  Lab 11/29/22 0301 11/30/22 0310 12/01/22 0529 12/02/22 0248 12/03/22 0238 12/04/22 0344 12/05/22 0307  HGB 11.6* 11.8* 12.0* 11.9* 12.0* 11.3* 12.2*  HCT 34.9* 36.0* 36.2* 36.7* 37.2* 34.8* 37.3*  MCV 85.3 88.5 86.4 88.9 87.3 88.1 87.4  -Checked Anemia Panel and showed an iron level of 48, UIBC of 235, TIBC of 283, saturation ratios of 17%, ferritin level of 474, folate of 15.8 and a vitamin B12 of 848 -Continue to Monitor for S/Sx of Bleeding; No overt bleeding noted -Repeat CBC in the AM    Hemolyzed Blood Samples -K+ was elevated this AM and Trend showed: Recent Labs  Lab 12/01/22 0529 12/02/22 0248 12/02/22 0845 12/03/22 0238 12/03/22 1222 12/04/22 0344 12/05/22 0307  K 3.8 6.0* 4.2 5.3* 3.8 3.8 3.6  -Likely Hemolysis so will repeat CMP this AM   Hypoalbuminemia -Patient's Albumin Trend: Recent Labs  Lab 11/30/22 0310 12/01/22 0529 12/02/22 0248 12/02/22 0845 12/03/22 0238 12/04/22 0344 12/05/22 0307  ALBUMIN 2.3* 2.2* 2.4* 2.4* 2.6* 2.4* 2.6*  -Continue to Monitor and Trend and repeat CMP in the AM

## 2022-11-18 NOTE — Consult Note (Signed)
Jon Bray 1940/12/25  [id number].    Requesting MD: Dr. Allena Katz Chief Complaint/Reason for Consult: Choledocholithiasis, gallstone pancreatitis    HPI: Jon Bray is a 82 y.o. male with hx of PAF, CAD, DM2, CKD, GERD, HTN, HLD who presented to [facility] for abdominal pain. Patient reports upper abdominal pain over the last year that is worse after eating. Had MRCP in March 2024 that showed Choledocholithiasis. Per notes was recommended to go to the ED for ERCP but declined. Labs were repeated with normal T. Bili level and he f/u with Dr. Hazle Quant at [facility] who recommended Lap Chole vs go to the ED if symptoms worsen. He began having worsening upper abdominal pain (10/10), n/v on 5/14. W/u c/w gallstone pancreatitis and choledocholithiasis. No evidence of Cholecystitis. He was transferred to [facility] for GI evaluation/ERCP. We were asked to see for possible Cholecystectomy. He reports continued upper abdominal pain to 7/10.  Prior Abdominal Surgeries: RIH repair  Blood Thinners: 81mg  ASA, otherwise none Tobacco Use: None Alcohol Use: None  ROS: ROS As above, see hpi  History reviewed. No pertinent family history.  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   Basal cell carcinoma of skin    a.) s/p Mohs procedure R/L nasal ala 01/27/2015   CAD (coronary artery disease) 05/12/2013   a.) STEMI --> LHC 05/12/2013: 20% p-mLAD, 20% D2, 40% LPDA, 30% LPAV, 99% dLCx --> PCI placing 4.5 x 20 mm and 2.5 x 12 mm Veriflex BMS to LPAV extending to dLCx. b.) cCTA 12/27/2017: Ca score 1578; 82nd percentile for age/sex match control; CT-FFR: RCA 0.99, pLAD 0.97, mLAD 0.98, dLAD 0.89, pLCx 0.98, mLCx 0.97, dLCx 0.97, PLB 0.91, PDA (most distal segment) 0.80.   Cataract    a.) s/p extraction with IOL placement   CKD (chronic kidney disease)    Diverticulosis     GERD (gastroesophageal reflux disease)    History of kidney stones 2023   HLD (hyperlipidemia)    Left lower lobe pulmonary nodule 08/01/2021   a.) CT 08/01/2021: measured 4 mm; perifissural   Long term current use of anticoagulant    a.) apixaban   NSVT (nonsustained ventricular tachycardia) (HCC) 05/13/2013   a.) 24 beat run while in ICU following PCI for STEMI   PAF (paroxysmal atrial fibrillation) (HCC) 11/16/2021   a.) new onset in ED on 11/16/2021. b.) CHA2DS2-VASc = 4 (age x 2, STEMI/aortic plaque, T2DM). c.) rate controlled without pharmacological intervention; started on apixaban 11/16/2021, however PCP discontinued on 11/18/2021 pending Holter study results.   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   Type 2 diabetes, diet controlled (HCC)     Past Surgical History:  Procedure Laterality Date   CATARACT EXTRACTION W/ INTRAOCULAR LENS IMPLANT Bilateral    COLONOSCOPY N/A 01/31/2001   COLONOSCOPY N/A 11/19/2008   CORONARY ANGIOPLASTY WITH STENT PLACEMENT Left 05/12/2013   Procedure: CORONARY ANGIOPLASTY WITH STENT PLACEMENT; Location: [facility]; Surgeon: Lieutenant Diego, MD   ESOPHAGOGASTRODUODENOSCOPY N/A 03/03/2000   ESOPHAGOGASTRODUODENOSCOPY (EGD) WITH PROPOFOL N/A 10/16/2021   Procedure: ESOPHAGOGASTRODUODENOSCOPY (EGD) WITH PROPOFOL;  Surgeon:  Regis Bill, MD;  Location: [facility];  Service: Endoscopy;  Laterality: N/A;   EXTRACORPOREAL SHOCK WAVE LITHOTRIPSY Left 08/06/2021   Procedure: EXTRACORPOREAL SHOCK WAVE LITHOTRIPSY (ESWL);  Surgeon: Vanna Scotland, MD;  Location: [facility];  Service: Urology;  Laterality: Left;   INGUINAL HERNIA REPAIR Right 11/27/2021   Procedure: HERNIA REPAIR INGUINAL ADULT;  Surgeon: Earline Mayotte, MD;  Location:  [facility] OR[facility];  Service: General;  Laterality: Right;   INTRAOCULAR LENS EXCHANGE Left 12/27/2018   Procedure: MECHANICAL VITRECTOMY,  EXCHANGE LENS PROSTHESIS VIA PARS PLANA APPROACH; Location: [facility]; Surgeon: Nicholaus Corolla, MD   MOHS SURGERY Bilateral 01/27/2015   Procedure: MOHS SURGERY TO REMOVE BASAL CELL CARCINOMA FROM RIGHT/LEFT NASAL ALA; Location: [facility]; Surgeon: Katrine Coho, MD   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 He has never used smokeless tobacco. He reports that he does not drink alcohol and does not use drugs.  Allergies: No Known Allergies  Medications Prior to Admission  Medication Sig Dispense Refill   aspirin 81 MG chewable tablet Chew 81 mg by mouth daily.     bisacodyl (DULCOLAX) 5 MG EC tablet Take 5 mg by mouth daily as needed for moderate constipation. (Patient not taking: Reported on 03/09/2022)     ferrous sulfate 325 (65 FE) MG tablet Take 325 mg by mouth daily with breakfast. (Patient not taking: Reported on 03/09/2022)     HYDROcodone-acetaminophen (NORCO/VICODIN) 5-325 MG tablet Take 1 tablet by mouth every 4 (four) hours as needed for moderate pain. (Patient not taking: Reported on 03/09/2022) 12 tablet 0   Lactobacillus (PROBIOTIC ACIDOPHILUS PO) Take by mouth.     Multiple Vitamin (MULTIVITAMIN) tablet Take 1 tablet by mouth daily.     Omega-3 Fatty Acids (FISH OIL) 1000 MG CAPS Take 1 capsule by mouth. (Patient not taking: Reported on 03/09/2022)     vitamin B-12 (CYANOCOBALAMIN) 1000 MCG tablet Take 1,000 mcg by mouth  daily at 12 noon. (Patient not taking: Reported on 03/09/2022)     Zinc Sulfate (ZINC-220 PO) Take by mouth.       Physical Exam: Blood pressure (!) 159/81, pulse 74, temperature 98.2 F (36.8 C), temperature source Oral, resp. rate 20, height 5\' 8"  (1.727 m), weight 76.2 kg, SpO2 95 %. General: pleasant, WD/WN male who is laying in bed in NAD HEENT: head is normocephalic, atraumatic.  Heart: regular, rate, and rhythm.  Lungs: CTA b/l, normal rate and effort  Abd: Soft, mild distension, epigastric and RUQ ttp, +BS. No masses, hernias, or organomegaly MS: no BUE or BLE edema Skin: warm and dry  Psych: A&Ox4 with an appropriate affect Neuro: cranial nerves grossly intact, normal speech, thought process intact, moves all extremities, gait not assessed   Results for orders placed or performed during the hospital encounter of 11/17/22 (from the past 48 hour(s))  CBC with Differential/Platelet     Status: Abnormal   Collection Time: 11/17/22  6:21 PM  Result Value Ref Range   WBC 21.1 (H) 4.0 - 10.5 K/uL   RBC 5.68 4.22 - 5.81 MIL/uL   Hemoglobin 16.4 13.0 - 17.0 g/dL  HCT 50.0 39.0 - 52.0 %   MCV 88.0 80.0 - 100.0 fL   MCH 28.9 26.0 - 34.0 pg   MCHC 32.8 30.0 - 36.0 g/dL   RDW 16.1 (H) 09.6 - 04.5 %   Platelets 206 150 - 400 K/uL   nRBC 0.0 0.0 - 0.2 %   Neutrophils Relative % 80 %   Neutro Abs 16.9 (H) 1.7 - 7.7 K/uL   Lymphocytes Relative 13 %   Lymphs Abs 2.7 0.7 - 4.0 K/uL   Monocytes Relative 6 %   Monocytes Absolute 1.3 (H) 0.1 - 1.0 K/uL   Eosinophils Relative 0 %   Eosinophils Absolute 0.0 0.0 - 0.5 K/uL   Basophils Relative 0 %   Basophils Absolute 0.0 0.0 - 0.1 K/uL   Immature Granulocytes 1 %   Abs Immature Granulocytes 0.12 (H) 0.00 - 0.07 K/uL    Comment: Performed at [facility], [address], [place], [place] [address]  Comprehensive metabolic panel     Status: Abnormal   Collection Time: 11/17/22  6:21 PM  Result Value Ref Range   Sodium 141 135 -  145 mmol/L   Potassium 5.3 (H) 3.5 - 5.1 mmol/L   Chloride 101 98 - 111 mmol/L   CO2 26 22 - 32 mmol/L   Glucose, Bld 174 (H) 70 - 99 mg/dL    Comment: Glucose reference range applies only to samples taken after fasting for at least 8 hours.   BUN 23 8 - 23 mg/dL   Creatinine, Ser 1.91 0.61 - 1.24 mg/dL   Calcium 8.9 8.9 - 47.8 mg/dL   Total Protein 7.5 6.5 - 8.1 g/dL   Albumin 3.8 3.5 - 5.0 g/dL   AST 295 (H) 15 - 41 U/L   ALT 207 (H) 0 - 44 U/L   Alkaline Phosphatase 139 (H) 38 - 126 U/L   Total Bilirubin 1.7 (H) 0.3 - 1.2 mg/dL   GFR, Estimated >62 >13 mL/min    Comment: (NOTE) Calculated using the CKD-EPI Creatinine Equation (2021)    Anion gap 14 5 - 15    Comment: Performed at [facility], [address] [place], [place] [address]  Magnesium     Status: None   Collection Time: 11/17/22  6:21 PM  Result Value Ref Range   Magnesium 2.1 1.7 - 2.4 mg/dL    Comment: Performed at [facility], [address] [place], [place] [address]  Protime-INR     Status: None   Collection Time: 11/17/22  6:21 PM  Result Value Ref Range   Prothrombin Time 14.8 11.4 - 15.2 seconds   INR 1.1 0.8 - 1.2    Comment: (NOTE) INR goal varies based on device and disease states. Performed at [facility], [address] [place], [place] [address]   Lipase, blood     Status: Abnormal   Collection Time: 11/17/22  6:21 PM  Result Value Ref Range   Lipase 524 (H) 11 - 51 U/L    Comment: RESULTS CONFIRMED BY MANUAL DILUTION Performed at [facility], [address] [place], [place] [address]   Comprehensive metabolic panel     Status: Abnormal   Collection Time: 11/18/22  6:04 AM  Result Value Ref Range   Sodium 137 135 - 145 mmol/L   Potassium 4.7 3.5 - 5.1 mmol/L   Chloride 100 98 - 111 mmol/L   CO2 27 22 - 32 mmol/L  Glucose, Bld 153 (H) 70 - 99 mg/dL    Comment: Glucose reference range applies only to samples taken after fasting for at least 8 hours.   BUN 24  (H) 8 - 23 mg/dL   Creatinine, Ser 6.30 0.61 - 1.24 mg/dL   Calcium 8.2 (L) 8.9 - 10.3 mg/dL   Total Protein 6.8 6.5 - 8.1 g/dL   Albumin 3.3 (L) 3.5 - 5.0 g/dL   AST 75 (H) 15 - 41 U/L   ALT 154 (H) 0 - 44 U/L   Alkaline Phosphatase 110 38 - 126 U/L   Total Bilirubin 2.1 (H) 0.3 - 1.2 mg/dL   GFR, Estimated >16 >01 mL/min    Comment: (NOTE) Calculated using the CKD-EPI Creatinine Equation (2021)    Anion gap 10 5 - 15    Comment: Performed at [facility], [address], [place], [place] [address]  CBC     Status: Abnormal   Collection Time: 11/18/22  6:04 AM  Result Value Ref Range   WBC 22.0 (H) 4.0 - 10.5 K/uL   RBC 5.32 4.22 - 5.81 MIL/uL   Hemoglobin 15.3 13.0 - 17.0 g/dL   HCT 55.7 32.2 - 02.5 %   MCV 88.5 80.0 - 100.0 fL   MCH 28.8 26.0 - 34.0 pg   MCHC 32.5 30.0 - 36.0 g/dL   RDW 42.7 (H) 06.2 - 37.6 %   Platelets 194 150 - 400 K/uL   nRBC 0.0 0.0 - 0.2 %    Comment: Performed at [facility], [address] [place], [place] [address]  Lipase, blood     Status: Abnormal   Collection Time: 11/18/22  6:04 AM  Result Value Ref Range   Lipase 385 (H) 11 - 51 U/L    Comment: Performed at [facility], [address] [place], [place] [address]   MR ABDOMEN MRCP W WO CONTAST  Result Date: 11/17/2022 CLINICAL DATA:  82 year old male with history of cholecystitis. Abdominal pain and vomiting. EXAM: MRI ABDOMEN WITHOUT AND WITH CONTRAST (INCLUDING MRCP) TECHNIQUE: Multiplanar multisequence MR imaging of the abdomen was performed both before and after the administration of intravenous contrast. Heavily T2-weighted images of the biliary and pancreatic ducts were obtained, and three-dimensional MRCP images were rendered by post processing. CONTRAST:  7.32mL GADAVIST GADOBUTROL 1 MMOL/ML IV SOLN COMPARISON:  Abdominal MRI 09/30/2022. Contemporaneously obtained CT of the abdomen and pelvis 11/16/2022. FINDINGS: Lower chest: Cardiomegaly.  Small hiatal hernia.  Hepatobiliary: No suspicious cystic or solid hepatic lesions are noted. Gallbladder is moderately distended. Gallbladder wall thickness is normal at 2 mm. No definite focal pericholecystic fluid. Multiple tiny filling defects lie dependently in the gallbladder, compatible with gallstones. MRCP images demonstrate no intrahepatic biliary ductal dilatation. Common bile duct is slightly prominent measuring 8 mm in the porta hepatis (within normal limits given the patient's age). However, in the distal common bile duct there is a 4 mm filling defect best appreciated on coronal MRCP image 15 of series 14), indicative of choledocholithiasis. Pancreas: Diffusely increased T2 signal intensity noted throughout the pancreatic parenchyma and in the adjacent fat of the retroperitoneum, indicative of acute pancreatitis. Pancreatic parenchyma enhances normally. No well-defined pancreatic or peripancreatic fluid collections. No discrete pancreatic mass confidently identified. Spleen:  Unremarkable. Adrenals/Urinary Tract: Bilateral kidneys and bilateral adrenal glands are normal in appearance. No hydroureteronephrosis in the visualized portions of the abdomen. Stomach/Bowel: Inflammatory changes are noted adjacent to the duodenum, presumably reflective of adjacent pancreatitis.  Remaining visualized portions of stomach, small bowel and colon in the abdomen are otherwise unremarkable. Vascular/Lymphatic: No aneurysm identified in the visualized abdominal vasculature. No lymphadenopathy noted in the abdomen. Other:  Trace volume of ascites. Musculoskeletal: No aggressive appearing osseous lesions are noted in the visualized portions of the skeleton. IMPRESSION: 1. Acute interstitial pancreatitis without complicating features (i.e., no pancreatic necrosis or pseudocyst formation noted at this time). 2. Choledocholithiasis with 4 mm stone in the distal common bile duct. Common bile duct measures up to 8 mm which is within normal limits  given the patient's advanced age. No intrahepatic biliary ductal dilatation to suggest frank biliary obstruction. There is also cholelithiasis with a moderately distended gallbladder, but gallbladder wall thickness is normal and there are no overt surrounding inflammatory changes to clearly indicate an acute cholecystitis at this time. 3. Cardiomegaly. 4. Small hiatal hernia. Electronically Signed   By: Trudie Reed M.D.   On: 11/17/2022 06:43   MR 3D Recon At Scanner  Result Date: 11/17/2022 CLINICAL DATA:  82 year old male with history of cholecystitis. Abdominal pain and vomiting. EXAM: MRI ABDOMEN WITHOUT AND WITH CONTRAST (INCLUDING MRCP) TECHNIQUE: Multiplanar multisequence MR imaging of the abdomen was performed both before and after the administration of intravenous contrast. Heavily T2-weighted images of the biliary and pancreatic ducts were obtained, and three-dimensional MRCP images were rendered by post processing. CONTRAST:  7.31mL GADAVIST GADOBUTROL 1 MMOL/ML IV SOLN COMPARISON:  Abdominal MRI 09/30/2022. Contemporaneously obtained CT of the abdomen and pelvis 11/16/2022. FINDINGS: Lower chest: Cardiomegaly.  Small hiatal hernia. Hepatobiliary: No suspicious cystic or solid hepatic lesions are noted. Gallbladder is moderately distended. Gallbladder wall thickness is normal at 2 mm. No definite focal pericholecystic fluid. Multiple tiny filling defects lie dependently in the gallbladder, compatible with gallstones. MRCP images demonstrate no intrahepatic biliary ductal dilatation. Common bile duct is slightly prominent measuring 8 mm in the porta hepatis (within normal limits given the patient's age). However, in the distal common bile duct there is a 4 mm filling defect best appreciated on coronal MRCP image 15 of series 14), indicative of choledocholithiasis. Pancreas: Diffusely increased T2 signal intensity noted throughout the pancreatic parenchyma and in the adjacent fat of the  retroperitoneum, indicative of acute pancreatitis. Pancreatic parenchyma enhances normally. No well-defined pancreatic or peripancreatic fluid collections. No discrete pancreatic mass confidently identified. Spleen:  Unremarkable. Adrenals/Urinary Tract: Bilateral kidneys and bilateral adrenal glands are normal in appearance. No hydroureteronephrosis in the visualized portions of the abdomen. Stomach/Bowel: Inflammatory changes are noted adjacent to the duodenum, presumably reflective of adjacent pancreatitis. Remaining visualized portions of stomach, small bowel and colon in the abdomen are otherwise unremarkable. Vascular/Lymphatic: No aneurysm identified in the visualized abdominal vasculature. No lymphadenopathy noted in the abdomen. Other:  Trace volume of ascites. Musculoskeletal: No aggressive appearing osseous lesions are noted in the visualized portions of the skeleton. IMPRESSION: 1. Acute interstitial pancreatitis without complicating features (i.e., no pancreatic necrosis or pseudocyst formation noted at this time). 2. Choledocholithiasis with 4 mm stone in the distal common bile duct. Common bile duct measures up to 8 mm which is within normal limits given the patient's advanced age. No intrahepatic biliary ductal dilatation to suggest frank biliary obstruction. There is also cholelithiasis with a moderately distended gallbladder, but gallbladder wall thickness is normal and there are no overt surrounding inflammatory changes to clearly indicate an acute cholecystitis at this time. 3. Cardiomegaly. 4. Small hiatal hernia. Electronically Signed   By: Trudie Reed M.D.   On: 11/17/2022  06:43   CT ABDOMEN PELVIS W CONTRAST  Result Date: 11/16/2022 CLINICAL DATA:  Acute abdominal pain EXAM: CT ABDOMEN AND PELVIS WITH CONTRAST TECHNIQUE: Multidetector CT imaging of the abdomen and pelvis was performed using the standard protocol following bolus administration of intravenous contrast. RADIATION DOSE  REDUCTION: This exam was performed according to the departmental dose-optimization program which includes automated exposure control, adjustment of the mA and/or kV according to patient size and/or use of iterative reconstruction technique. CONTRAST:  OMNIPAQUE IOHEXOL 300 MG/ML  SOLN COMPARISON:  MRI abdomen 09/22/2022. CT abdomen and pelvis 09/29/2022 FINDINGS: Lower chest: There is some patchy ground-glass and airspace opacities in both lung bases. Hepatobiliary: No focal liver abnormality is seen. No gallstones, gallbladder wall thickening, or biliary dilatation. Pancreas: There is mild to moderate diffuse peripancreatic stranding and fluid compatible with acute pancreatitis. The pancreas enhances normally. No fluid collection or ductal dilatation. Spleen: Normal in size without focal abnormality. Adrenals/Urinary Tract: There is a punctate calcification in the bladder at the level of the right ureterovesicular junction, similar to prior. Bladder is distended. There is no hydronephrosis or perinephric stranding. The adrenal glands are within normal limits. Stomach/Bowel: There is a small hiatal hernia. Stomach is within normal limits. Appendix appears normal. No evidence of bowel wall thickening, distention, or inflammatory changes. There is sigmoid colon diverticulosis. There is a large amount of stool throughout the colon. Vascular/Lymphatic: Aortic atherosclerosis. No enlarged abdominal or pelvic lymph nodes. Reproductive: Prostate is enlarged. Other: There is a small amount of fluid tracking along the retroperitoneum bilaterally secondary to pancreatitis. There is a small fat containing umbilical hernia. Musculoskeletal: There are degenerative changes at L2-L3, unchanged. IMPRESSION: 1. Findings compatible with acute pancreatitis. No evidence for pancreatic necrosis or fluid collection. 2. Stable punctate calcification in the bladder at the level of the right ureterovesicular junction. No  hydronephrosis. 3. Patchy ground-glass and airspace opacities in both lung bases, likely infectious/inflammatory. 4. Colonic diverticulosis. 5. Prostatomegaly. Aortic Atherosclerosis (ICD10-I70.0). Electronically Signed   By: Darliss Cheney M.D.   On: 11/16/2022 23:43   DG Chest Portable 1 View  Result Date: 11/16/2022 CLINICAL DATA:  Shortness of breath. EXAM: PORTABLE CHEST 1 VIEW COMPARISON:  August 01, 2021 FINDINGS: The heart size and mediastinal contours are within normal limits. There is marked severity calcification of the aortic arch. Low lung volumes are noted. Mild, diffuse, chronic appearing increased interstitial lung markings are seen. Mild areas of atelectasis are seen within the bilateral lung bases, left greater than right. No pleural effusion or pneumothorax is identified. The visualized skeletal structures are unremarkable. IMPRESSION: Low lung volumes with mild bibasilar atelectasis, left greater than right. Electronically Signed   By: Aram Candela M.D.   On: 11/16/2022 22:32   US ABDOMEN LIMITED RUQ (LIVER/GB)  Result Date: 11/16/2022 CLINICAL DATA:  Gallstone. EXAM: ULTRASOUND ABDOMEN LIMITED RIGHT UPPER QUADRANT COMPARISON:  None Available. FINDINGS: Gallbladder: Multiple mobile, shadowing echogenic gallstones are seen within the gallbladder lumen. The largest measures 1.1 cm. There is no evidence of gallbladder wall thickening (1.8 mm) no sonographic Murphy sign noted by sonographer. Common bile duct: Diameter: 6.5 mm Liver: No focal lesion identified. Within normal limits in parenchymal echogenicity. Portal vein is patent on color Doppler imaging with normal direction of blood flow towards the liver. Other: None. IMPRESSION: Cholelithiasis, without evidence of acute cholecystitis. Electronically Signed   By: Aram Candela M.D.   On: 11/16/2022 21:44    Anti-infectives (From admission, onward)    Start  Dose/Rate Route Frequency Ordered Stop   11/18/22 0600   piperacillin-tazobactam (ZOSYN) IVPB 3.375 g        3.375 g 12.5 mL/hr over 240 Minutes Intravenous Every 8 hours 11/17/22 1753     11/17/22 1845  piperacillin-tazobactam (ZOSYN) IVPB 3.375 g        3.375 g 100 mL/hr over 30 Minutes Intravenous  Once 11/17/22 1753 11/17/22 2012       Assessment/Plan Gallstone pancreatitis  Choledocholithiasis with elevated LFT's - Recommend GI consultation for Choledocholithiasis. T. Bili uptrending today. Suspect will need ERCP.  - Continue medical management of Pancreatitis. No complicating features on CT or MRCP. Lipase improving. Still with some ttp on exam. - Once Choledocholithiasis has been addressed by GI and pancreatitis resolves, would recommend laparoscopic cholecystectomy  - No evidence of Cholecystitis on [place], CT or MRCP. Does not need abx from our standpoint - We will follow with you  FEN - NPO, IVF per [facility] VTE - SCDs,  ID - Zosyn per [facility] (see above)  I reviewed nursing notes, hospitalist notes, last 24 h vitals and pain scores, last 48 h intake and output, last 24 h labs and trends, and last 24 h imaging results.   Jacinto Halim, [facility] Surgery 11/18/2022, 11:09 AM Please see Amion for pager number during day hours 7:00am-4:30pm

## 2022-11-18 NOTE — Progress Notes (Signed)
[facility] Progress Note Patient: Jon Bray WUJ:[id number] DOB: April 29, 1941 DOA: 11/17/2022  DOS: the patient was seen and examined on 11/18/2022  Brief hospital course: Jon Bray is a 82 y.o. male with PMH significant of CAD 2014. Present to the hospital with complaints of abdominal pain and nausea and vomiting ongoing for last 3 days. Originally seen at [facility] ER on 5/14.  Reportedly had issues with his gallbladder ongoing for last 1 month.  Has seen general surgery in April and was recommended to undergo cholecystectomy. GI and general surgery consulted.  Currently conservative management recommended with eventual plan for ERCP and lap chole. Assessment and Plan: Acute gallstone pancreatitis Concern for cholangitis. Leukocytosis. Presents with complaints of abdominal pain. CT abdomen shows evidence of pancreatitis. On admission lipase more than 10,000,has mild leukocytosis, LFTs are 200 and Bilirubin 2.5. Lipase level improving to 385.  AST and ALT improving as well.  Bilirubin still elevated. [facility] consulted. Currently no plans for ERCP but will require it eventually. General surgery also consulted.  Awaiting for improvement in pancreatitis. N.p.o. for now. IV Zofran and IV Dilaudid for pain control and nausea control. No prohibitive risk for surgery or ERCP so far.  Mild renal insufficiency. Baseline serum creatinine appears to be around 0.7-1. Creatinine trending up to 1.1.  BUN trending up to 24. Will provide more bolus as well as increase the rate from 150 cc/h to 200 cc/h and monitor.  CAD (coronary artery disease) History in 2014. Currently no anginal symptoms. Patient actually have been fairly active without any anginal symptoms On exertion. Currently holding aspirin.   GERD (gastroesophageal reflux disease) Reported brown-colored emesis. Will provide IV Protonix. Monitor.   Hyperlipidemia, mixed Holding statin.  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 Will check hemoglobin A1c.   Mild hypokalemia.  Mild hyperkalemia. Currently stable. Monitor.  Patchy groundglass opacities in lungs and the CT scan Possible aspiration pneumonitis. Patient does not have any respiratory complaints. Impression does have some mild hypoxia. Will provide incentive spirometry. No evidence of pneumonia for now.  Possible aspiration pneumonitis cannot be ruled out given patient's multiple episodes of vomiting prior to coming to the hospital. Patient already on antibiotics.  Subjective: Abdominal pain still present and unchanged.  No nausea no vomiting no fever no chills.  Not passing gas.  No BM so far.  Breathing improving.  No chest pain.  Physical Exam: General: in Mild distress, No Rash Cardiovascular: S1 and S2 Present, No Murmur Respiratory: Good respiratory effort, Bilateral Air entry present. No Crackles, No wheezes Abdomen: Bowel Sound present, upper abdominal diffuse tenderness Extremities: No edema Neuro: Alert and oriented x3, no new focal deficit  Data Reviewed: I have Reviewed nursing notes, Vitals, and Lab results. Since last encounter, pertinent lab results CBC and BMP and lipase   . I have ordered test including CBC and CMP and lipase  . I have discussed pt's care plan and test results with GI and general surgery  .  Disposition: Status is: Inpatient Remains inpatient appropriate because: Need for further workup and eventual treatment for his gallstones SCDs Start: 11/17/22 1732 Family Communication: Family at bedside Level of care: Med-Surg   Vitals:   11/17/22 1833 11/17/22 2030 11/18/22 0512 11/18/22 0755  BP: (!) 165/85 (!) 158/86 (!) 144/81 (!) 159/81  Pulse: 69 71 70 74  Resp: 20 18 18 20   Temp: 97.7 F (36.5 C) 97.7 F (36.5 C) 98.7 F (37.1 C) 98.2 F (36.8  C)  TempSrc: Oral Oral Oral Oral  SpO2:  97% 96% 95%  Weight: 76.2 kg     Height: 5\' 8"  (1.727 m)         Author: Lynden Oxford, MD 11/18/2022 3:02 PM  Please look on [email or web address] to find out who is on call.

## 2022-11-18 NOTE — Consult Note (Addendum)
Referring Provider: Delton Prairie, MD Primary Care Physician:  Danella Penton, MD Primary Gastroenterologist:  Dr. Mia Creek at [facility]  Reason for Consultation:  Pancreatitis and CBD stone  HPI: Jon Bray is a 82 y.o. male with past medical history of coronary artery disease with intervention in 2014, chronic kidney disease, hyperlipidemia, type 2 diabetes diet-controlled.  He presented to the ER at [facility] with complaints of abdominal pain and nausea and vomiting for 3 days.  Apparently has been having issues with her gallbladder for the past month.  He saw surgery in April and they recommended cholecystectomy.  Upon presentation lipase was greater than 10 K.  LFTs were elevated with a total bili up to 2.5, alk phos up to 141, ALT 236, AST 236.  LFTs have trended down and lipase has trended down.  This morning lipase is 385.  He is on Zosyn.  White blood cell count elevated at 22 K.  Ultrasound showed cholelithiasis.  CT scan of the abdomen and pelvis with contrast showed acute pancreatitis without complications.  MRI abdomen/MRCP:  IMPRESSION: 1. Acute interstitial pancreatitis without complicating features (i.e., no pancreatic necrosis or pseudocyst formation noted at this time). 2. Choledocholithiasis with 4 mm stone in the distal common bile duct. Common bile duct measures up to 8 mm which is within normal limits given the patient's advanced age. No intrahepatic biliary ductal dilatation to suggest frank biliary obstruction. There is also cholelithiasis with a moderately distended gallbladder, but gallbladder wall thickness is normal and there are no overt surrounding inflammatory changes to clearly indicate an acute cholecystitis at this time. 3. Cardiomegaly. 4. Small hiatal hernia.   He is on LR at 200 cc/hour.  He is feeling better today but obviously still sore in upper abdomen.  He mentions that every time he has pain related to his gallbladder that he experiences hoarseness,  voice is raspy today.  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   Basal cell carcinoma of skin    a.) s/p Mohs procedure R/L nasal ala 01/27/2015   CAD (coronary artery disease) 05/12/2013   a.) STEMI --> LHC 05/12/2013: 20% p-mLAD, 20% D2, 40% LPDA, 30% LPAV, 99% dLCx --> PCI placing 4.5 x 20 mm and 2.5 x 12 mm Veriflex BMS to LPAV extending to dLCx. b.) cCTA 12/27/2017: Ca score 1578; 82nd percentile for age/sex match control; CT-FFR: RCA 0.99, pLAD 0.97, mLAD 0.98, dLAD 0.89, pLCx 0.98, mLCx 0.97, dLCx 0.97, PLB 0.91, PDA (most distal segment) 0.80.   Cataract    a.) s/p extraction with IOL placement   CKD (chronic kidney disease)    Diverticulosis    GERD (gastroesophageal reflux disease)    History of kidney stones 2023   HLD (hyperlipidemia)    Left lower lobe pulmonary nodule 08/01/2021   a.) CT 08/01/2021: measured 4 mm; perifissural   Long term current use of anticoagulant    a.) apixaban   NSVT (nonsustained ventricular tachycardia) (HCC) 05/13/2013   a.) 24 beat run while in ICU following PCI for STEMI   PAF (paroxysmal atrial fibrillation) (HCC) 11/16/2021   a.) new onset in ED on 11/16/2021. b.) CHA2DS2-VASc = 4 (age x 2, STEMI/aortic plaque, T2DM). c.) rate controlled without pharmacological intervention; started on apixaban 11/16/2021, however PCP discontinued on 11/18/2021 pending Holter study results.   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   Type 2 diabetes, diet controlled (HCC)     Past Surgical History:  Procedure Laterality Date   CATARACT EXTRACTION W/ INTRAOCULAR LENS IMPLANT Bilateral    COLONOSCOPY N/A  01/31/2001   COLONOSCOPY N/A 11/19/2008   CORONARY ANGIOPLASTY WITH STENT PLACEMENT Left 05/12/2013   Procedure: CORONARY ANGIOPLASTY WITH STENT PLACEMENT; Location: [facility]; Surgeon: Lieutenant Diego, MD   ESOPHAGOGASTRODUODENOSCOPY N/A 03/03/2000   ESOPHAGOGASTRODUODENOSCOPY (EGD) WITH PROPOFOL N/A 10/16/2021   Procedure: ESOPHAGOGASTRODUODENOSCOPY (EGD) WITH PROPOFOL;  Surgeon: Regis Bill, MD;  Location: [facility];  Service: Endoscopy;  Laterality: N/A;   EXTRACORPOREAL SHOCK WAVE LITHOTRIPSY Left 08/06/2021   Procedure: EXTRACORPOREAL SHOCK WAVE LITHOTRIPSY (ESWL);  Surgeon: Vanna Scotland, MD;  Location: [facility] OR[facility];  Service: Urology;  Laterality: Left;   INGUINAL HERNIA REPAIR Right 11/27/2021   Procedure: HERNIA REPAIR INGUINAL ADULT;  Surgeon: Earline Mayotte, MD;  Location: [facility];  Service: General;  Laterality: Right;   INTRAOCULAR LENS EXCHANGE Left 12/27/2018   Procedure: MECHANICAL VITRECTOMY,  EXCHANGE LENS PROSTHESIS VIA PARS PLANA APPROACH; Location: [facility]; Surgeon: Nicholaus Corolla, MD   MOHS SURGERY Bilateral 01/27/2015   Procedure: MOHS SURGERY TO REMOVE BASAL CELL CARCINOMA FROM RIGHT/LEFT NASAL ALA; Location: [facility]; Surgeon: Katrine Coho, MD   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 Authorizing Provider  aspirin 81 MG chewable tablet Chew 81 mg by mouth daily.    [provider]  bisacodyl (DULCOLAX) 5 MG EC tablet Take 5 mg by mouth daily as needed for moderate constipation. Patient  not taking: Reported on 03/09/2022    [provider]  ferrous sulfate 325 (65 FE) MG tablet Take 325 mg by mouth daily with breakfast. Patient not taking: Reported on 03/09/2022    [provider]  HYDROcodone-acetaminophen (NORCO/VICODIN) 5-325 MG tablet Take 1 tablet by mouth every 4 (four) hours as needed for moderate pain. Patient not taking: Reported on 03/09/2022 11/27/21 11/27/22  Earline Mayotte, MD  Lactobacillus (PROBIOTIC ACIDOPHILUS PO) Take by mouth.    [provider]  Multiple Vitamin (MULTIVITAMIN) tablet Take 1 tablet by mouth daily.    [provider]  Omega-3 Fatty Acids (FISH OIL) 1000 MG CAPS Take 1 capsule by mouth. Patient not taking: Reported on 03/09/2022    [provider]  vitamin B-12 (CYANOCOBALAMIN) 1000 MCG tablet Take 1,000 mcg by mouth daily at 12 noon. Patient not taking: Reported on 03/09/2022    [provider]  Zinc Sulfate (ZINC-220 PO) Take by mouth.    [provider]    Current Facility-Administered Medications  Medication Dose Route Frequency Provider Last Rate Last Admin   acetaminophen (TYLENOL) tablet 650 mg  650 mg Oral Q6H PRN Rolly Salter, MD       Or   acetaminophen (TYLENOL) suppository 650 mg  650 mg Rectal Q6H PRN  Rolly Salter, MD       bisacodyl (DULCOLAX) suppository 10 mg  10 mg Rectal Daily PRN Rolly Salter, MD       HYDROmorphone (DILAUDID) injection 0.5 mg  0.5 mg Intravenous Q2H PRN Rolly Salter, MD   0.5 mg at 11/18/22 0915   lactated ringers bolus 500 mL  500 mL Intravenous Once Rolly Salter, MD       lactated ringers infusion   Intravenous Continuous Rolly Salter, MD 200 mL/hr at 11/18/22 0916 Rate Change at 11/18/22 0916   methocarbamol (ROBAXIN) 500 mg in dextrose 5 % 50 mL IVPB  500 mg Intravenous Q6H PRN Rolly Salter, MD       ondansetron [facility]) tablet 4 mg  4 mg Oral Q6H PRN Rolly Salter, MD       Or   ondansetron [facility]) injection 4 mg  4 mg  Intravenous Q6H PRN Rolly Salter, MD       pantoprazole (PROTONIX) injection 40 mg  40 mg Intravenous Q24H Rolly Salter, MD   40 mg at 11/17/22 1815   piperacillin-tazobactam (ZOSYN) IVPB 3.375 g  3.375 g Intravenous Q8H Calton Dach I, RPH 12.5 mL/hr at 11/18/22 0646 3.375 g at 11/18/22 0646    Allergies as of 11/17/2022   (No Known Allergies)    History reviewed. No pertinent family history.  Social History   Socioeconomic History   Marital status: Married    Spouse name: Makell Keizer   Number of children: Not on file   Years of education: Not on file   Highest education level: Not on file  Occupational History   Not on file  Tobacco Use   Smoking status: Never   Smokeless tobacco: Never  Vaping Use   Vaping Use: Never used  Substance and Sexual Activity   Alcohol use: Never   Drug use: Never   Sexual activity: Yes  Other Topics Concern   Not on file  Social History Narrative   Lives at home with wife.   Social Determinants of Health   Financial Resource Strain: Low Risk  (11/17/2022)   Overall Financial Resource Strain (CARDIA)    Difficulty of Paying Living Expenses: Not hard at all  Food Insecurity: No Food Insecurity (11/17/2022)   Hunger Vital Sign    Worried About Running Out of Food in the Last Year: Never true    Ran Out of Food in the Last Year: Never true  Transportation Needs: No Transportation Needs (11/17/2022)   PRAPARE - Administrator, Civil Service (Medical): No    Lack of Transportation (Non-Medical): No  Physical Activity: Sufficiently Active (11/17/2022)   Exercise Vital Sign    Days of Exercise per Week: 6 days    Minutes of Exercise per Session: 60 min  Stress: No Stress Concern Present (11/17/2022)   [employer] of Occupational Health - Occupational Stress Questionnaire    Feeling of Stress : Not at all  Social Connections: Socially Integrated (11/17/2022)   Social Connection and Isolation Panel [NHANES]     Frequency of Communication with Friends and Family: More than three times a week    Frequency of Social Gatherings with Friends and Family: More than three times a week    Attends Religious Services: More than 4 times per year    Active Member of [employer] or Organizations: Yes    Attends Banker Meetings: 1 to 4 times per year  Marital Status: Married  Catering manager Violence: Not At Risk (11/17/2022)   Humiliation, Afraid, Rape, and Kick questionnaire    Fear of Current or Ex-Partner: No    Emotionally Abused: No    Physically Abused: No    Sexually Abused: No    Review of Systems: ROS is O/W negative except as mentioned in HPI.  Physical Exam: Vital signs in last 24 hours: Temp:  [96.1 F (35.6 C)-98.7 F (37.1 C)] 98.2 F (36.8 C) (05/16 0755) Pulse Rate:  [63-74] 74 (05/16 0755) Resp:  [18-22] 20 (05/16 0755) BP: (123-170)/(74-92) 159/81 (05/16 0755) SpO2:  [94 %-98 %] 95 % (05/16 0755) Weight:  [76.2 kg] 76.2 kg (05/15 1833) Last BM Date : 11/16/22 General:  Alert, Well-developed, well-nourished, pleasant and cooperative in NAD Head:  Normocephalic and atraumatic. Eyes:  Sclera clear, no icterus.  Conjunctiva pink. Ears:  Normal auditory acuity. Mouth:  No deformity or lesions.  Voice is raspy. Lungs:  Clear throughout to auscultation.  No wheezes, crackles, or rhonchi.  Heart:  Regular rate and rhythm; no murmurs, clicks, rubs, or gallops. Abdomen:  Soft,non-distended.  BS present.  TTP in upper abdomen.    Msk:  Symmetrical without gross deformities. Pulses:  Normal pulses noted. Extremities:  Without clubbing or edema. Neurologic:  Alert and oriented x 4;  grossly normal neurologically. Skin:  Intact without significant lesions or rashes. Psych:  Alert and cooperative. Normal mood and affect.  Lab Results: Recent Labs    11/16/22 2051 11/17/22 1821 11/18/22 0604  WBC 12.8* 21.1* 22.0*  HGB 15.4 16.4 15.3  HCT 47.0 50.0 47.1  PLT 223 206 194    BMET Recent Labs    11/16/22 2051 11/17/22 1821 11/18/22 0604  NA 140 141 137  K 3.4* 5.3* 4.7  CL 105 101 100  CO2 24 26 27   GLUCOSE 164* 174* 153*  BUN 18 23 24*  CREATININE 0.78 1.08 1.11  CALCIUM 8.9 8.9 8.2*   LFT Recent Labs    11/18/22 0604  PROT 6.8  ALBUMIN 3.3*  AST 75*  ALT 154*  ALKPHOS 110  BILITOT 2.1*   PT/INR Recent Labs    11/17/22 1821  LABPROT 14.8  INR 1.1   Studies/Results: MR ABDOMEN MRCP W WO CONTAST  Result Date: 11/17/2022 CLINICAL DATA:  82 year old male with history of cholecystitis. Abdominal pain and vomiting. EXAM: MRI ABDOMEN WITHOUT AND WITH CONTRAST (INCLUDING MRCP) TECHNIQUE: Multiplanar multisequence MR imaging of the abdomen was performed both before and after the administration of intravenous contrast. Heavily T2-weighted images of the biliary and pancreatic ducts were obtained, and three-dimensional MRCP images were rendered by post processing. CONTRAST:  7.69mL GADAVIST GADOBUTROL 1 MMOL/ML IV SOLN COMPARISON:  Abdominal MRI 09/30/2022. Contemporaneously obtained CT of the abdomen and pelvis 11/16/2022. FINDINGS: Lower chest: Cardiomegaly.  Small hiatal hernia. Hepatobiliary: No suspicious cystic or solid hepatic lesions are noted. Gallbladder is moderately distended. Gallbladder wall thickness is normal at 2 mm. No definite focal pericholecystic fluid. Multiple tiny filling defects lie dependently in the gallbladder, compatible with gallstones. MRCP images demonstrate no intrahepatic biliary ductal dilatation. Common bile duct is slightly prominent measuring 8 mm in the porta hepatis (within normal limits given the patient's age). However, in the distal common bile duct there is a 4 mm filling defect best appreciated on coronal MRCP image 15 of series 14), indicative of choledocholithiasis. Pancreas: Diffusely increased T2 signal intensity noted throughout the pancreatic parenchyma and in the adjacent fat of the retroperitoneum,  indicative of acute pancreatitis. Pancreatic parenchyma enhances normally. No well-defined pancreatic or peripancreatic fluid collections. No discrete pancreatic mass confidently identified. Spleen:  Unremarkable. Adrenals/Urinary Tract: Bilateral kidneys and bilateral adrenal glands are normal in appearance. No hydroureteronephrosis in the visualized portions of the abdomen. Stomach/Bowel: Inflammatory changes are noted adjacent to the duodenum, presumably reflective of adjacent pancreatitis. Remaining visualized portions of stomach, small bowel and colon in the abdomen are otherwise unremarkable. Vascular/Lymphatic: No aneurysm identified in the visualized abdominal vasculature. No lymphadenopathy noted in the abdomen. Other:  Trace volume of ascites. Musculoskeletal: No aggressive appearing osseous lesions are noted in the visualized portions of the skeleton. IMPRESSION: 1. Acute interstitial pancreatitis without complicating features (i.e., no pancreatic necrosis or pseudocyst formation noted at this time). 2. Choledocholithiasis with 4 mm stone in the distal common bile duct. Common bile duct measures up to 8 mm which is within normal limits given the patient's advanced age. No intrahepatic biliary ductal dilatation to suggest frank biliary obstruction. There is also cholelithiasis with a moderately distended gallbladder, but gallbladder wall thickness is normal and there are no overt surrounding inflammatory changes to clearly indicate an acute cholecystitis at this time. 3. Cardiomegaly. 4. Small hiatal hernia. Electronically Signed   By: Trudie Reed M.D.   On: 11/17/2022 06:43   MR 3D Recon At Scanner  Result Date: 11/17/2022 CLINICAL DATA:  82 year old male with history of cholecystitis. Abdominal pain and vomiting. EXAM: MRI ABDOMEN WITHOUT AND WITH CONTRAST (INCLUDING MRCP) TECHNIQUE: Multiplanar multisequence MR imaging of the abdomen was performed both before and after the administration of  intravenous contrast. Heavily T2-weighted images of the biliary and pancreatic ducts were obtained, and three-dimensional MRCP images were rendered by post processing. CONTRAST:  7.26mL GADAVIST GADOBUTROL 1 MMOL/ML IV SOLN COMPARISON:  Abdominal MRI 09/30/2022. Contemporaneously obtained CT of the abdomen and pelvis 11/16/2022. FINDINGS: Lower chest: Cardiomegaly.  Small hiatal hernia. Hepatobiliary: No suspicious cystic or solid hepatic lesions are noted. Gallbladder is moderately distended. Gallbladder wall thickness is normal at 2 mm. No definite focal pericholecystic fluid. Multiple tiny filling defects lie dependently in the gallbladder, compatible with gallstones. MRCP images demonstrate no intrahepatic biliary ductal dilatation. Common bile duct is slightly prominent measuring 8 mm in the porta hepatis (within normal limits given the patient's age). However, in the distal common bile duct there is a 4 mm filling defect best appreciated on coronal MRCP image 15 of series 14), indicative of choledocholithiasis. Pancreas: Diffusely increased T2 signal intensity noted throughout the pancreatic parenchyma and in the adjacent fat of the retroperitoneum, indicative of acute pancreatitis. Pancreatic parenchyma enhances normally. No well-defined pancreatic or peripancreatic fluid collections. No discrete pancreatic mass confidently identified. Spleen:  Unremarkable. Adrenals/Urinary Tract: Bilateral kidneys and bilateral adrenal glands are normal in appearance. No hydroureteronephrosis in the visualized portions of the abdomen. Stomach/Bowel: Inflammatory changes are noted adjacent to the duodenum, presumably reflective of adjacent pancreatitis. Remaining visualized portions of stomach, small bowel and colon in the abdomen are otherwise unremarkable. Vascular/Lymphatic: No aneurysm identified in the visualized abdominal vasculature. No lymphadenopathy noted in the abdomen. Other:  Trace volume of ascites.  Musculoskeletal: No aggressive appearing osseous lesions are noted in the visualized portions of the skeleton. IMPRESSION: 1. Acute interstitial pancreatitis without complicating features (i.e., no pancreatic necrosis or pseudocyst formation noted at this time). 2. Choledocholithiasis with 4 mm stone in the distal common bile duct. Common bile duct measures up to 8 mm which is within normal limits given the patient's advanced age. No intrahepatic  biliary ductal dilatation to suggest frank biliary obstruction. There is also cholelithiasis with a moderately distended gallbladder, but gallbladder wall thickness is normal and there are no overt surrounding inflammatory changes to clearly indicate an acute cholecystitis at this time. 3. Cardiomegaly. 4. Small hiatal hernia. Electronically Signed   By: Trudie Reed M.D.   On: 11/17/2022 06:43   CT ABDOMEN PELVIS W CONTRAST  Result Date: 11/16/2022 CLINICAL DATA:  Acute abdominal pain EXAM: CT ABDOMEN AND PELVIS WITH CONTRAST TECHNIQUE: Multidetector CT imaging of the abdomen and pelvis was performed using the standard protocol following bolus administration of intravenous contrast. RADIATION DOSE REDUCTION: This exam was performed according to the departmental dose-optimization program which includes automated exposure control, adjustment of the mA and/or kV according to patient size and/or use of iterative reconstruction technique. CONTRAST:  OMNIPAQUE IOHEXOL 300 MG/ML  SOLN COMPARISON:  MRI abdomen 09/22/2022. CT abdomen and pelvis 09/29/2022 FINDINGS: Lower chest: There is some patchy ground-glass and airspace opacities in both lung bases. Hepatobiliary: No focal liver abnormality is seen. No gallstones, gallbladder wall thickening, or biliary dilatation. Pancreas: There is mild to moderate diffuse peripancreatic stranding and fluid compatible with acute pancreatitis. The pancreas enhances normally. No fluid collection or ductal dilatation. Spleen:  Normal in size without focal abnormality. Adrenals/Urinary Tract: There is a punctate calcification in the bladder at the level of the right ureterovesicular junction, similar to prior. Bladder is distended. There is no hydronephrosis or perinephric stranding. The adrenal glands are within normal limits. Stomach/Bowel: There is a small hiatal hernia. Stomach is within normal limits. Appendix appears normal. No evidence of bowel wall thickening, distention, or inflammatory changes. There is sigmoid colon diverticulosis. There is a large amount of stool throughout the colon. Vascular/Lymphatic: Aortic atherosclerosis. No enlarged abdominal or pelvic lymph nodes. Reproductive: Prostate is enlarged. Other: There is a small amount of fluid tracking along the retroperitoneum bilaterally secondary to pancreatitis. There is a small fat containing umbilical hernia. Musculoskeletal: There are degenerative changes at L2-L3, unchanged. IMPRESSION: 1. Findings compatible with acute pancreatitis. No evidence for pancreatic necrosis or fluid collection. 2. Stable punctate calcification in the bladder at the level of the right ureterovesicular junction. No hydronephrosis. 3. Patchy ground-glass and airspace opacities in both lung bases, likely infectious/inflammatory. 4. Colonic diverticulosis. 5. Prostatomegaly. Aortic Atherosclerosis (ICD10-I70.0). Electronically Signed   By: Darliss Cheney M.D.   On: 11/16/2022 23:43   DG Chest Portable 1 View  Result Date: 11/16/2022 CLINICAL DATA:  Shortness of breath. EXAM: PORTABLE CHEST 1 VIEW COMPARISON:  August 01, 2021 FINDINGS: The heart size and mediastinal contours are within normal limits. There is marked severity calcification of the aortic arch. Low lung volumes are noted. Mild, diffuse, chronic appearing increased interstitial lung markings are seen. Mild areas of atelectasis are seen within the bilateral lung bases, left greater than right. No pleural effusion or pneumothorax  is identified. The visualized skeletal structures are unremarkable. IMPRESSION: Low lung volumes with mild bibasilar atelectasis, left greater than right. Electronically Signed   By: Aram Candela M.D.   On: 11/16/2022 22:32   US ABDOMEN LIMITED RUQ (LIVER/GB)  Result Date: 11/16/2022 CLINICAL DATA:  Gallstone. EXAM: ULTRASOUND ABDOMEN LIMITED RIGHT UPPER QUADRANT COMPARISON:  None Available. FINDINGS: Gallbladder: Multiple mobile, shadowing echogenic gallstones are seen within the gallbladder lumen. The largest measures 1.1 cm. There is no evidence of gallbladder wall thickening (1.8 mm) no sonographic Murphy sign noted by sonographer. Common bile duct: Diameter: 6.5 mm Liver: No focal lesion identified.  Within normal limits in parenchymal echogenicity. Portal vein is patent on color Doppler imaging with normal direction of blood flow towards the liver. Other: None. IMPRESSION: Cholelithiasis, without evidence of acute cholecystitis. Electronically Signed   By: Aram Candela M.D.   On: 11/16/2022 21:44    IMPRESSION:  -Acute gallstone pancreatitis with a 4 mm CBD stone in the CBD, not obstructing at this time.  Lipase trending down from >10K-->385.  LFTs/total bili trending down overall.  LR going at 200 cc/hour. -Leukocytosis:  WBC count 22K.  PLAN: -Continue supportive care for pancreatitis with IVFs, pain control, etc. -No plan for ERCP urgently at this time. -Continue abx in the form of Zosyn. -Will allow ice chips and sips of clears from the floor. -Would have surgery see the patient as well. -Trend labs including WBC count, LFTs, etc.  Shanda Bumps D. Zehr  11/18/2022, 9:21 AM   I have taken an interval history, thoroughly reviewed the chart and examined the patient. I agree with the Advanced Practitioner's note, impression and recommendations, and have recorded additional findings, impressions and recommendations below. I performed a substantive portion of this encounter (>50% time  spent), including a complete performance of the medical decision making.  My additional thoughts are as follows:  Symptomatic gallstones Gallstone pancreatitis with choledocholithiasis on imaging.  Lipase downtrending, patient still with significant upper abdominal pain, and LFTs mildly improved from yesterday.  He is on Zosyn for coverage in case there is any degree of cholangitis.  It is concerning that his white count is risen from 12.8-22 K today, though with improvement in LFTs, this is less concerning for cholangitis and it is for inflammation related to the pancreatitis.  IV fluids, pain control, bowel rest  This patient will need an ERCP this admission.  Exact timing to be determined by my partner Dr. Yancey Flemings with whom the case was discussed this morning.  He felt the ERCP should wait until further treatment of the pancreatitis. ERCP explained to them.  Further discussion along with risks and benefits will happen with Dr. Marina Goodell when the procedure is to be performed.  Check CBC, CMP and lipase tomorrow  N.p.o. except sips and chips  Impression and plan discussed with him and his wife who is at the bedside.  They were appreciative of the care they have received so far, and all questions were answered.   Charlie Pitter III [phone]

## 2022-11-18 NOTE — Progress Notes (Signed)
  Transition of Care [facility]) Screening Note   Patient Details  Name: Jon Bray Date of Birth: 04-27-1941   Transition of Care [facility]) CM/SW Contact:    Harriet Masson, RN Phone Number: 11/18/2022, 9:09 AM    Transition of Care Department [facility]) has reviewed patient and no TOC needs have been identified at this time. We will continue to monitor patient advancement through interdisciplinary progression rounds. If new patient transition needs arise, please place a TOC consult.

## 2022-11-19 ENCOUNTER — Inpatient Hospital Stay (HOSPITAL_COMMUNITY): Payer: HMO

## 2022-11-19 DIAGNOSIS — K8050 Calculus of bile duct without cholangitis or cholecystitis without obstruction: Secondary | ICD-10-CM

## 2022-11-19 DIAGNOSIS — D72829 Elevated white blood cell count, unspecified: Secondary | ICD-10-CM | POA: Diagnosis not present

## 2022-11-19 DIAGNOSIS — R1010 Upper abdominal pain, unspecified: Secondary | ICD-10-CM | POA: Diagnosis not present

## 2022-11-19 DIAGNOSIS — R7989 Other specified abnormal findings of blood chemistry: Secondary | ICD-10-CM | POA: Diagnosis not present

## 2022-11-19 DIAGNOSIS — K851 Biliary acute pancreatitis without necrosis or infection: Secondary | ICD-10-CM | POA: Diagnosis not present

## 2022-11-19 LAB — COMPREHENSIVE METABOLIC PANEL
ALT: 85 U/L — ABNORMAL HIGH (ref 0–44)
AST: 37 U/L (ref 15–41)
Albumin: 2.6 g/dL — ABNORMAL LOW (ref 3.5–5.0)
Alkaline Phosphatase: 77 U/L (ref 38–126)
Anion gap: 9 (ref 5–15)
BUN: 17 mg/dL (ref 8–23)
CO2: 26 mmol/L (ref 22–32)
Calcium: 7.9 mg/dL — ABNORMAL LOW (ref 8.9–10.3)
Chloride: 100 mmol/L (ref 98–111)
Creatinine, Ser: 0.89 mg/dL (ref 0.61–1.24)
GFR, Estimated: >60 mL/min (ref >60)
Glucose, Bld: 124 mg/dL — ABNORMAL HIGH (ref 70–99)
Potassium: 3.9 mmol/L (ref 3.5–5.1)
Sodium: 135 mmol/L (ref 135–145)
Total Bilirubin: 2.4 mg/dL — ABNORMAL HIGH (ref 0.3–1.2)
Total Protein: 6.1 g/dL — ABNORMAL LOW (ref 6.5–8.1)

## 2022-11-19 LAB — CBC
HCT: 41.3 % (ref 39.0–52.0)
Hemoglobin: 13.4 g/dL (ref 13.0–17.0)
MCH: 28.9 pg (ref 26.0–34.0)
MCHC: 32.4 g/dL (ref 30.0–36.0)
MCV: 89.2 fL (ref 80.0–100.0)
Platelets: 172 10*3/uL (ref 150–400)
RBC: 4.63 MIL/uL (ref 4.22–5.81)
RDW: 15.9 % — ABNORMAL HIGH (ref 11.5–15.5)
WBC: 20.6 10*3/uL — ABNORMAL HIGH (ref 4.0–10.5)
nRBC: 0 % (ref 0.0–0.2)

## 2022-11-19 LAB — LIPASE, BLOOD: Lipase: 96 U/L — ABNORMAL HIGH (ref 11–51)

## 2022-11-19 LAB — HEMOGLOBIN A1C
Hgb A1c MFr Bld: 5.8 % — ABNORMAL HIGH (ref 4.8–5.6)
Mean Plasma Glucose: 119.76 mg/dL

## 2022-11-19 MED ORDER — SODIUM CHLORIDE 0.9 % IV SOLN: INTRAVENOUS | Status: DC

## 2022-11-19 MED ORDER — BISACODYL 10 MG RE SUPP
10.0000 mg | Freq: Once | RECTAL | Status: AC
  Administered 2022-11-19: 10 mg via RECTAL
  Filled 2022-11-19: qty 1

## 2022-11-19 MED ORDER — HEPARIN SODIUM (PORCINE) 5000 UNIT/ML IJ SOLN
5000.0000 [IU] | Freq: Three times a day (TID) | INTRAMUSCULAR | Status: DC
  Administered 2022-11-19 – 2022-11-20 (×2): 5000 [IU] via SUBCUTANEOUS
  Filled 2022-11-19 (×2): qty 1

## 2022-11-19 MED ORDER — SIMETHICONE 80 MG PO CHEW
80.0000 mg | CHEWABLE_TABLET | Freq: Four times a day (QID) | ORAL | Status: AC
  Administered 2022-11-19: 80 mg via ORAL
  Filled 2022-11-19 (×2): qty 1

## 2022-11-19 NOTE — Progress Notes (Signed)
[facility] Progress Note Patient: Jon Bray [id number]:[id number] DOB: [birth year] DOA: 11/17/2022  DOS: the patient was seen and examined on 11/19/2022  Brief hospital course: NASSIM KULBACKI is a 82 y.o. male with PMH significant of CAD 2014. Present to the hospital with complaints of abdominal pain and nausea and vomiting ongoing for last 3 days. Originally seen at [facility] ER on 5/14.  Reportedly had issues with his gallbladder ongoing for last 1 month.  Has seen general surgery in April and was recommended to undergo cholecystectomy. GI and general surgery consulted.  Currently conservative management recommended with eventual plan for ERCP and lap chole. Assessment and Plan: Acute gallstone pancreatitis Concern for cholangitis. Leukocytosis. Presents with complaints of abdominal pain. CT abdomen shows evidence of pancreatitis. On admission lipase more than 10,000,has mild leukocytosis, LFTs are 200 and Bilirubin 2.5. Lipase level improving. AST and ALT improving as well. Bilirubin still elevated. [facility] consulted. Currently no plans for ERCP but will require it eventually. General surgery also consulted.  Awaiting for improvement in pancreatitis. N.p.o. for now.  Ice chips and sips of water okay. IV Zofran and IV Dilaudid for pain control and nausea control. No prohibitive risk for surgery or ERCP so far. Initially was on LR.  Per nursing unable to designate 2 PIV's for LR and Zosyn and therefore will switch IV fluid to NS.  Mild renal insufficiency. Baseline serum creatinine appears to be around 0.7-1. Creatinine trending up to 1.1.  BUN trending up to 24. Improving with aggressive IV hydration.  Rate reduced 200 mL/h per GI recommendation.   CAD (coronary artery disease) History in 2014. Currently no anginal symptoms. Patient actually have been fairly active without any anginal symptoms On exertion. Currently holding aspirin.   GERD (gastroesophageal reflux disease) Reported  brown-colored emesis. Currently receiving IV Protonix. Monitor.   Hyperlipidemia, mixed Holding statin.   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 Hemoglobin A1c 5.8.  In the prediabetes range.   Mild hypokalemia.  Mild hyperkalemia. Currently stable. Monitor.   Patchy groundglass opacities in lungs and the CT scan Possible aspiration pneumonitis. Patient does not have any respiratory complaints. Impression does have some mild hypoxia. Will provide incentive spirometry. No evidence of pneumonia for now.  Possible aspiration pneumonitis cannot be ruled out given patient's multiple episodes of vomiting prior to coming to the hospital. Patient already on antibiotics.  Constipation. X-ray abdomen performed on 5/17 shows evidence of constipation. Patient had not had any BM so far not passing gas. Suppository ordered but the patient refusing. Continue simethicone.   Subjective: Patient reports ongoing abdominal pain which is unchanged since his presentation.  Denies any nausea or vomiting.  Has not had any BM.  Not passing any gas.  Physical Exam: General: in Mild distress, No Rash Cardiovascular: S1 and S2 Present, No Murmur Respiratory: Good respiratory effort, Bilateral Air entry present. No Crackles, No wheezes Abdomen: Bowel Sound present, diffuse lower abdominal tenderness Extremities: No edema Neuro: Alert and oriented x3, no new focal deficit  Data Reviewed: I have Reviewed nursing notes, Vitals, and Lab results. Since last encounter, pertinent lab results CBC and CMP and lipase   . I have ordered test including CBC and CMP and lipase  . I have discussed pt's care plan and test results with GI  . I have ordered imaging x-ray abdomen  .  Disposition: Status is: Inpatient Remains inpatient appropriate because: Needing IV antibiotics and further workup and therapy.  SCDs Start: 11/17/22  74   Family Communication: wife at  bedside Level of care: Med-Surg  Vitals:   11/18/22 1626 11/18/22 1927 11/19/22 0500 11/19/22 0752  BP: (!) 154/83 (!) 145/73 (!) 151/85 138/85  Pulse: 71 75 83 86  Resp: 20 16 18    Temp: 98.5 F (36.9 C) 99.3 F (37.4 C) 97.7 F (36.5 C) 98.6 F (37 C)  TempSrc: Tympanic Oral Oral Oral  SpO2: 96% 95% 91% 94%  Weight:      Height:         Author: Lynden Oxford, MD 11/19/2022 4:28 PM  Please look on [email or web address] to find out who is on call.

## 2022-11-19 NOTE — Progress Notes (Signed)
Subjective: CC: Stable/increased pain in epigastrium/ruq. No n/v. Required 10 doses of PRN dilaudid yesterday.   Afebrile. No tachycardia or hypotension. WBC 20.6 from 22.0. T. Bili up to 2.4 from 2.1 today. Lipase downtrending at 96 from 385.  Objective: Vital signs in last 24 hours: Temp:  [97.7 F (36.5 C)-99.3 F (37.4 C)] 98.6 F (37 C) (05/17 0752) Pulse Rate:  [71-86] 86 (05/17 0752) Resp:  [16-20] 18 (05/17 0500) BP: (138-154)/(73-85) 138/85 (05/17 0752) SpO2:  [91 %-96 %] 94 % (05/17 0752) Last BM Date : 11/16/22  Intake/Output from previous day: 05/16 0701 - 05/17 0700 In: 719.8 [I.V.:664.6; IV Piggyback:55.2] Out: -  Intake/Output this shift: No intake/output data recorded.  PE: Gen:  Alert, NAD, pleasant Abd: Soft, ND, epigastric and RUQ ttp, +BS,   Lab Results:  Recent Labs    11/18/22 0604 11/19/22 0905  WBC 22.0* 20.6*  HGB 15.3 13.4  HCT 47.1 41.3  PLT 194 172   BMET Recent Labs    11/18/22 0604 11/19/22 0905  NA 137 135  K 4.7 3.9  CL 100 100  CO2 27 26  GLUCOSE 153* 124*  BUN 24* 17  CREATININE 1.11 0.89  CALCIUM 8.2* 7.9*   PT/INR Recent Labs    11/17/22 1821  LABPROT 14.8  INR 1.1   CMP     Component Value Date/Time   NA 135 11/19/2022 0905   NA 138 05/12/2013 0759   K 3.9 11/19/2022 0905   K 3.7 05/12/2013 0759   CL 100 11/19/2022 0905   CL 105 05/12/2013 0759   CO2 26 11/19/2022 0905   CO2 25 05/12/2013 0759   GLUCOSE 124 (H) 11/19/2022 0905   GLUCOSE 199 (H) 05/12/2013 0759   BUN 17 11/19/2022 0905   BUN 18 05/12/2013 0759   CREATININE 0.89 11/19/2022 0905   CREATININE 1.08 05/12/2013 0759   CALCIUM 7.9 (L) 11/19/2022 0905   CALCIUM 8.8 05/12/2013 0759   PROT 6.1 (L) 11/19/2022 0905   PROT 8.0 05/12/2013 0759   ALBUMIN 2.6 (L) 11/19/2022 0905   ALBUMIN 4.0 05/12/2013 0759   AST 37 11/19/2022 0905   AST 22 05/12/2013 0759   ALT 85 (H) 11/19/2022 0905   ALT 32 05/12/2013 0759   ALKPHOS 77 11/19/2022  0905   ALKPHOS 79 05/12/2013 0759   BILITOT 2.4 (H) 11/19/2022 0905   BILITOT 1.0 05/12/2013 0759   GFRNONAA >60 11/19/2022 0905   GFRNONAA >60 05/12/2013 0759   GFRAA >60 12/07/2018 2227   GFRAA >60 05/12/2013 0759   Lipase     Component Value Date/Time   LIPASE 96 (H) 11/19/2022 0905    Studies/Results: No results found.  Anti-infectives: Anti-infectives (From admission, onward)    Start     Dose/Rate Route Frequency Ordered Stop   11/18/22 0600  piperacillin-tazobactam (ZOSYN) IVPB 3.375 g        3.375 g 12.5 mL/hr over 240 Minutes Intravenous Every 8 hours 11/17/22 1753     11/17/22 1845  piperacillin-tazobactam (ZOSYN) IVPB 3.375 g        3.375 g 100 mL/hr over 30 Minutes Intravenous  Once 11/17/22 1753 11/17/22 2012        Assessment/Plan Gallstone pancreatitis  Choledocholithiasis with elevated LFT's - GI following for Choledocholithiasis. T. Bili uptrending today. They plan ERCP during admission.  - Continue medical management of Pancreatitis. No complicating features on CT or MRCP. Lipase improving. Still ttp on exam. - Once Choledocholithiasis has been  addressed by GI and pancreatitis resolves, would recommend laparoscopic cholecystectomy during admission.  - No evidence of Cholecystitis on [place], CT or MRCP. Does not need abx from our standpoint - We will follow with you   FEN - NPO, IVF per [facility] VTE - SCDs, okay for chem ppx from a general surgery standpoint.  ID - Zosyn per [facility] (see above)  I reviewed nursing notes, Consultant (GI) notes, hospitalist notes, last 24 h vitals and pain scores, last 48 h intake and output, last 24 h labs and trends, and last 24 h imaging results.   LOS: 2 days    Jacinto Halim , [facility] 11/19/2022, 10:29 AM Please see Amion for pager number during day hours 7:00am-4:30pm

## 2022-11-19 NOTE — Plan of Care (Signed)

## 2022-11-19 NOTE — Progress Notes (Addendum)
[facility] Progress Note  CC:  Pancreatitis and CBD stone   Subjective:  Says that he feels worse today, pain is worse.  He thinks that he waited to long to ask for pain meds overnight.  A little nausea.  Not passing flatus and no BM since Tuesday.  Objective:  Vital signs in last 24 hours: Temp:  [97.7 F (36.5 C)-99.3 F (37.4 C)] 98.6 F (37 C) (05/17 0752) Pulse Rate:  [71-86] 86 (05/17 0752) Resp:  [16-20] 18 (05/17 0500) BP: (138-154)/(73-85) 138/85 (05/17 0752) SpO2:  [91 %-96 %] 94 % (05/17 0752) Last BM Date : 11/16/22 General:  Alert, Well-developed, in NAD Heart:  Regular rate and rhythm; no murmurs Pulm:  CTAB.  No W/R/R. Abdomen:  Soft, non-distended.  BS present but quiet.  TTP in the upper abdomen > in the epigastrium. Extremities:  Without edema. Neurologic:  Alert and oriented x 4;  grossly normal neurologically. Psych:  Alert and cooperative. Normal mood and affect.  Intake/Output from previous day: 05/16 0701 - 05/17 0700 In: 719.8 [I.V.:664.6; IV Piggyback:55.2] Out: -   Lab Results: Recent Labs    11/16/22 2051 11/17/22 1821 11/18/22 0604  WBC 12.8* 21.1* 22.0*  HGB 15.4 16.4 15.3  HCT 47.0 50.0 47.1  PLT 223 206 194   BMET Recent Labs    11/16/22 2051 11/17/22 1821 11/18/22 0604  NA 140 141 137  K 3.4* 5.3* 4.7  CL 105 101 100  CO2 24 26 27   GLUCOSE 164* 174* 153*  BUN 18 23 24*  CREATININE 0.78 1.08 1.11  CALCIUM 8.9 8.9 8.2*   LFT Recent Labs    11/18/22 0604  PROT 6.8  ALBUMIN 3.3*  AST 75*  ALT 154*  ALKPHOS 110  BILITOT 2.1*   PT/INR Recent Labs    11/17/22 1821  LABPROT 14.8  INR 1.1   Assessment / Plan: -Acute gallstone pancreatitis with a 4 mm CBD stone in the CBD, not obstructing at this time.  Lipase trending down from >10K-->385-->96.  LFTs/total bili trending down overall.  LR going at 200 cc/hour. -Leukocytosis:  WBC count 20K.  -Continue supportive care for pancreatitis with IVFs,  pain control, etc. -ERCP at some point once pancreatitis is improved (suspect maybe not until early next week). -Continue abx in the form of Zosyn. -Will allow ice chips and sips of clears from the floor. -Trend labs including WBC count, LFTs, etc. -Surgery is following as well, appreciate their input.    LOS: 2 days   Princella Pellegrini. Zehr  11/19/2022, 9:33 AM  I have taken an interval history, thoroughly reviewed the chart and examined the patient. I agree with the Advanced Practitioner's note, impression and recommendations, and have recorded additional findings, impressions and recommendations below. I performed a substantive portion of this encounter (>50% time spent), including a complete performance of the medical decision making.  My additional thoughts are as follows:  Upper abdominal pain and tenderness worse since we saw him yesterday, suggesting his that the inflammation from pancreatitis has worsened somewhat. Surgery feels confident this patient does not have acute cholecystitis. And his LFTs are stable, implying he is not having worsening biliary obstruction and cholangitis.  I agree he should continue antibiotics for now because there is a stone in the bile duct and risk of cholangitis occurring from that.  Recommend cutting his IV fluid rate by 50% (discussed with Dr Allena Katz)  ERCP prior to cholecystectomy.  Case reviewed daily with Dr.  Marina Goodell for biliary backup.  Charlie Pitter III Office:[phone]

## 2022-11-20 DIAGNOSIS — D72829 Elevated white blood cell count, unspecified: Secondary | ICD-10-CM | POA: Diagnosis not present

## 2022-11-20 DIAGNOSIS — K851 Biliary acute pancreatitis without necrosis or infection: Secondary | ICD-10-CM | POA: Diagnosis not present

## 2022-11-20 LAB — COMPREHENSIVE METABOLIC PANEL
ALT: 59 U/L — ABNORMAL HIGH (ref 0–44)
AST: 31 U/L (ref 15–41)
Albumin: 2.4 g/dL — ABNORMAL LOW (ref 3.5–5.0)
Alkaline Phosphatase: 63 U/L (ref 38–126)
Anion gap: 7 (ref 5–15)
BUN: 17 mg/dL (ref 8–23)
CO2: 27 mmol/L (ref 22–32)
Calcium: 7.8 mg/dL — ABNORMAL LOW (ref 8.9–10.3)
Chloride: 101 mmol/L (ref 98–111)
Creatinine, Ser: 0.9 mg/dL (ref 0.61–1.24)
GFR, Estimated: >60 mL/min (ref >60)
Glucose, Bld: 88 mg/dL (ref 70–99)
Potassium: 4 mmol/L (ref 3.5–5.1)
Sodium: 135 mmol/L (ref 135–145)
Total Bilirubin: 2.1 mg/dL — ABNORMAL HIGH (ref 0.3–1.2)
Total Protein: 6.2 g/dL — ABNORMAL LOW (ref 6.5–8.1)

## 2022-11-20 LAB — CBC WITH DIFFERENTIAL/PLATELET
Abs Immature Granulocytes: 0.07 10*3/uL (ref 0.00–0.07)
Basophils Absolute: 0 10*3/uL (ref 0.0–0.1)
Basophils Relative: 0 %
Eosinophils Absolute: 0 10*3/uL (ref 0.0–0.5)
Eosinophils Relative: 0 %
HCT: 39 % (ref 39.0–52.0)
Hemoglobin: 12.7 g/dL — ABNORMAL LOW (ref 13.0–17.0)
Immature Granulocytes: 0 %
Lymphocytes Relative: 18 %
Lymphs Abs: 2.9 10*3/uL (ref 0.7–4.0)
MCH: 29.5 pg (ref 26.0–34.0)
MCHC: 32.6 g/dL (ref 30.0–36.0)
MCV: 90.5 fL (ref 80.0–100.0)
Monocytes Absolute: 1.1 10*3/uL — ABNORMAL HIGH (ref 0.1–1.0)
Monocytes Relative: 6 %
Neutro Abs: 12.5 10*3/uL — ABNORMAL HIGH (ref 1.7–7.7)
Neutrophils Relative %: 76 %
Platelets: 183 10*3/uL (ref 150–400)
RBC: 4.31 MIL/uL (ref 4.22–5.81)
RDW: 15.4 % (ref 11.5–15.5)
WBC: 16.6 10*3/uL — ABNORMAL HIGH (ref 4.0–10.5)
nRBC: 0 % (ref 0.0–0.2)

## 2022-11-20 LAB — MAGNESIUM: Magnesium: 2 mg/dL (ref 1.7–2.4)

## 2022-11-20 LAB — LIPASE, BLOOD: Lipase: 39 U/L (ref 11–51)

## 2022-11-20 MED ORDER — SODIUM CHLORIDE (HYPERTONIC) 5 % OP OINT
1.0000 | TOPICAL_OINTMENT | Freq: Every day | OPHTHALMIC | Status: DC
  Administered 2022-11-21: 1 via OPHTHALMIC
  Filled 2022-11-20 (×2): qty 3.5

## 2022-11-20 MED ORDER — POLYVINYL ALCOHOL 1.4 % OP SOLN
1.0000 [drp] | OPHTHALMIC | Status: DC | PRN
  Administered 2022-11-20 – 2022-12-08 (×6): 1 [drp] via OPHTHALMIC
  Filled 2022-11-20: qty 15

## 2022-11-20 MED ORDER — ENOXAPARIN SODIUM 40 MG/0.4ML IJ SOSY
40.0000 mg | PREFILLED_SYRINGE | INTRAMUSCULAR | Status: DC
  Administered 2022-11-20 – 2022-11-21 (×2): 40 mg via SUBCUTANEOUS
  Filled 2022-11-20 (×2): qty 0.4

## 2022-11-20 MED ORDER — SIMETHICONE 80 MG PO CHEW: 80.0000 mg | CHEWABLE_TABLET | Freq: Four times a day (QID) | ORAL | Status: AC

## 2022-11-20 NOTE — Progress Notes (Signed)
Pharmacy Antibiotic Note  Jon Bray is a 82 y.o. male admitted on 11/17/2022 with  intra-abdominal infection .  Pharmacy has been consulted for zosyn dosing.  MRCP (5/14): acute pancreatitis without complicating features, choledocholithiasis w/ 4mm stone GI plans to continue antibiotics at this time. Patient is NPO.   Today is day 4 of antibiotics. WBC have trended down, today at 16.6. Renal function is stable. He is afebrile. No culture data.   Plan: Continue Zosyn 3.375g IV q 8h  F/u clinical status, LOT, surg plans   Height: 5\' 8"  (172.7 cm) Weight: 76.2 kg (168 lb) IBW/kg (Calculated) : 68.4  Temp (24hrs), Avg:98.2 F (36.8 C), Min:97.7 F (36.5 C), Max:98.5 F (36.9 C)  Recent Labs  Lab 11/16/22 2051 11/17/22 1821 11/18/22 0604 11/19/22 0905 11/20/22 0754  WBC 12.8* 21.1* 22.0* 20.6* 16.6*  CREATININE 0.78 1.08 1.11 0.89 0.90    Estimated Creatinine Clearance: 60.2 mL/min (by C-G formula based on SCr of 0.9 mg/dL).    No Known Allergies  Antimicrobials this admission: Zosyn 5/15>    Fara Olden, PharmD, BCPS, BCOP Clinical Pharmacist 11/20/2022 12:50 PM

## 2022-11-20 NOTE — Progress Notes (Signed)
[facility] Progress Note Patient: Jon Bray QMV:[id number] DOB: [birth year] DOA: 11/17/2022  DOS: the patient was seen and examined on 11/20/2022  Brief hospital course: Jon Bray is a 82 y.o. male with PMH significant of CAD 2014. Present to the hospital with complaints of abdominal pain and nausea and vomiting ongoing for last 3 days. Originally seen at [facility] ER on 5/14.  Reportedly had issues with his gallbladder ongoing for last 1 month.  Has seen general surgery in April and was recommended to undergo cholecystectomy. GI and general surgery consulted.  Currently conservative management recommended with eventual plan for ERCP and lap chole. Assessment and Plan: Acute gallstone pancreatitis Concern for cholangitis. Leukocytosis. Presents with complaints of abdominal pain. CT abdomen shows evidence of pancreatitis. On admission lipase more than 10,000,has mild leukocytosis, LFTs are 200 and Bilirubin 2.5. Lipase level improving. AST and ALT improving as well. Bilirubin still elevated. [facility] consulted.  Currently recommending ERCP possibly next week pending pain improvement. General surgery also consulted.  Awaiting for improvement in pancreatitis. N.p.o. for now.  Ice chips and sips of water okay.  Later GI advance to full liquid diet. IV Zofran and IV Dilaudid for pain control and nausea control. No prohibitive risk for surgery or ERCP so far. Currently on NS.  Mild renal insufficiency. Baseline serum creatinine appears to be around 0.7-1. Creatinine trending up to 1.1.  BUN trending up to 24. Improving with aggressive IV hydration.   CAD (coronary artery disease) History in 2014. Currently no anginal symptoms. Patient actually have been fairly active without any anginal symptoms On exertion. Currently holding aspirin.   GERD (gastroesophageal reflux disease) Reported brown-colored emesis. Currently receiving IV Protonix. Monitor.   Hyperlipidemia,  mixed Holding statin.   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 Hemoglobin A1c 5.8.  In the prediabetes range.   Mild hypokalemia.  Mild hyperkalemia. Currently stable. Monitor.   Patchy groundglass opacities in lungs and the CT scan Possible aspiration pneumonitis. Patient does not have any respiratory complaints. does have some mild hypoxia. Continue incentive spirometry. No evidence of pneumonia for now.  Possible aspiration pneumonitis cannot be ruled out given patient's multiple episodes of vomiting prior to coming to the hospital. Patient already on antibiotics.  Constipation. X-ray abdomen performed on 5/17 shows evidence of constipation. Patient had not had any BM so far not passing gas. Suppository ordered but the patient refusing. Continue simethicone.   Subjective: On initial evaluation patient tells me that he feels "about the same".  But he is able to walk multiple rounds in the hallway.  Has no nausea or vomiting.  Actually had a BM. Denies any shortness of breath at the time of my evaluation. Explained in details about importance of accurately providing review of system.  Physical Exam: Appears in no distress.  No rash. S1-S2 present.  No murmur. Clear to auscultation. Bowel sound present.  Minimal tenderness epigastric area as well as central abdomen. No edema.  Data Reviewed: I have Reviewed nursing notes, Vitals, and Lab results. Since last encounter, pertinent lab results CBC and BMP   . I have ordered test including CBC and BMP  .   Disposition: Status is: Inpatient Remains inpatient appropriate because: Currently doing ERCP and lap chole.  enoxaparin (LOVENOX) injection 40 mg Start: 11/20/22 1445 SCDs Start: 11/17/22 1732   Family Communication: wife at bedside Level of care: Med-Surg  Vitals:   11/19/22 1600 11/19/22 1948 11/20/22 0400 11/20/22 0811  BP: [name])  148/76 (!) 141/72 (!) 140/73 139/71  Pulse: 74 71  70 74  Resp:  16 16 16   Temp: 98.4 F (36.9 C) 98.5 F (36.9 C) 98.1 F (36.7 C) 97.7 F (36.5 C)  TempSrc: Oral Oral Oral Oral  SpO2: 94% 93% 93% 93%  Weight:      Height:         Author: Lynden Oxford, MD 11/20/2022 4:25 PM  Please look on [email or web address] to find out who is on call.

## 2022-11-20 NOTE — Progress Notes (Addendum)
[facility] Progress Note  CC:  Pancreatitis and CBD stone   Subjective:  Received a suppository yesterday to help him have a BM.  Says that pain is not a whole lot better but wife reports that he is saying pain is a 5 today as opposed to 7 or 8 yesterday.  He got washed up today and has been walking around.  Wife and daughter are at bedside.  Has not had any PO intake since 5/14 mid-day.  Objective:  Vital signs in last 24 hours: Temp:  [97.7 F (36.5 C)-98.5 F (36.9 C)] 97.7 F (36.5 C) (05/18 0811) Pulse Rate:  [70-74] 74 (05/18 0811) Resp:  [16] 16 (05/18 0811) BP: (139-148)/(71-76) 139/71 (05/18 0811) SpO2:  [93 %-94 %] 93 % (05/18 0811) Last BM Date : 11/16/22 General:  Alert, Well-developed, in NAD Heart:  Regular rate and rhythm; no murmurs Pulm:  CTAB.  No W/R/R. Abdomen:  Soft, non-distended.  BS present but quiet.  TTP in epigastrium. Extremities:  Without edema. Neurologic:  Alert and oriented x 4;  grossly normal neurologically. Psych:  Alert and cooperative. Normal mood and affect.  Intake/Output from previous day: 05/17 0701 - 05/18 0700 In: 4621.7 [I.V.:4621.7] Out: -   Lab Results: Recent Labs    11/18/22 0604 11/19/22 0905 11/20/22 0754  WBC 22.0* 20.6* 16.6*  HGB 15.3 13.4 12.7*  HCT 47.1 41.3 39.0  PLT 194 172 183   BMET Recent Labs    11/18/22 0604 11/19/22 0905 11/20/22 0754  NA 137 135 135  K 4.7 3.9 4.0  CL 100 100 101  CO2 27 26 27   GLUCOSE 153* 124* 88  BUN 24* 17 17  CREATININE 1.11 0.89 0.90  CALCIUM 8.2* 7.9* 7.8*   LFT Recent Labs    11/20/22 0754  PROT 6.2*  ALBUMIN 2.4*  AST 31  ALT 59*  ALKPHOS 63  BILITOT 2.1*   PT/INR Recent Labs    11/17/22 1821  LABPROT 14.8  INR 1.1   DG Abd Portable 1V  Result Date: 11/19/2022 CLINICAL DATA:  Nausea vomiting EXAM: PORTABLE ABDOMEN - 1 VIEW COMPARISON:  03/09/2022, CT 11/16/2022 FINDINGS: Nonobstructed gas pattern with mild to moderate stool. No  radiopaque calculi. CT demonstrated right UVJ stone not well seen radiographically. There are phleboliths in the pelvis. IMPRESSION: Negative. Electronically Signed   By: Jasmine Pang M.D.   On: 11/19/2022 15:26    Assessment / Plan: *Acute gallstone pancreatitis with a 4 mm CBD stone in the CBD, not obstructing at this time.  Lipase trending down from >10K-->385-->96->39.  LFTs/total bili trending down overall.  LR going at 100 cc/hour. *Leukocytosis:  WBC count 16.6K.   -Continue supportive care for pancreatitis with IVFs (decreased from 200cc/hr to 100 cc/hr on 5/17), pain control, etc. -ERCP at some point once pancreatitis is improved (suspect maybe not until early next week). -Continue abx in the form of Zosyn. -He can have ice chips.  Has not taken anything by mouth since 5/14. -Trend labs including WBC count, LFTs, etc. -Surgery is following as well, appreciate their input.   LOS: 3 days   Princella Pellegrini. Zehr  11/20/2022, 12:24 PM  GI ATTENDING  Interval history data reviewed.  Patient seen and examined.  Agree with interval progress note as outlined above.  Patient with gallstone pancreatitis.  All parameters are improving.  Still with abdominal pain.  Will try full liquid diet.  ERCP likely next week once pain has significantly improved (  as ERCP can exacerbate pancreatitis).  Wilhemina Bonito. Eda Keys., M.D. [facility] Division of Gastroenterology

## 2022-11-21 ENCOUNTER — Inpatient Hospital Stay (HOSPITAL_COMMUNITY): Payer: HMO

## 2022-11-21 DIAGNOSIS — D72829 Elevated white blood cell count, unspecified: Secondary | ICD-10-CM | POA: Diagnosis not present

## 2022-11-21 DIAGNOSIS — K8050 Calculus of bile duct without cholangitis or cholecystitis without obstruction: Secondary | ICD-10-CM | POA: Diagnosis present

## 2022-11-21 DIAGNOSIS — K851 Biliary acute pancreatitis without necrosis or infection: Secondary | ICD-10-CM | POA: Diagnosis not present

## 2022-11-21 DIAGNOSIS — E875 Hyperkalemia: Secondary | ICD-10-CM | POA: Diagnosis not present

## 2022-11-21 LAB — LIPASE, BLOOD: Lipase: 32 U/L (ref 11–51)

## 2022-11-21 LAB — CBC WITH DIFFERENTIAL/PLATELET
Abs Immature Granulocytes: 0.07 10*3/uL (ref 0.00–0.07)
Basophils Absolute: 0 10*3/uL (ref 0.0–0.1)
Basophils Relative: 0 %
Eosinophils Absolute: 0.1 10*3/uL (ref 0.0–0.5)
Eosinophils Relative: 1 %
HCT: 36.4 % — ABNORMAL LOW (ref 39.0–52.0)
Hemoglobin: 11.9 g/dL — ABNORMAL LOW (ref 13.0–17.0)
Immature Granulocytes: 0 %
Lymphocytes Relative: 19 %
Lymphs Abs: 3 10*3/uL (ref 0.7–4.0)
MCH: 29 pg (ref 26.0–34.0)
MCHC: 32.7 g/dL (ref 30.0–36.0)
MCV: 88.6 fL (ref 80.0–100.0)
Monocytes Absolute: 1.2 10*3/uL — ABNORMAL HIGH (ref 0.1–1.0)
Monocytes Relative: 8 %
Neutro Abs: 11.5 10*3/uL — ABNORMAL HIGH (ref 1.7–7.7)
Neutrophils Relative %: 72 %
Platelets: 179 10*3/uL (ref 150–400)
RBC: 4.11 MIL/uL — ABNORMAL LOW (ref 4.22–5.81)
RDW: 15.1 % (ref 11.5–15.5)
WBC: 15.8 10*3/uL — ABNORMAL HIGH (ref 4.0–10.5)
nRBC: 0 % (ref 0.0–0.2)

## 2022-11-21 LAB — COMPREHENSIVE METABOLIC PANEL
ALT: 47 U/L — ABNORMAL HIGH (ref 0–44)
AST: 25 U/L (ref 15–41)
Albumin: 2.2 g/dL — ABNORMAL LOW (ref 3.5–5.0)
Alkaline Phosphatase: 57 U/L (ref 38–126)
Anion gap: 10 (ref 5–15)
BUN: 15 mg/dL (ref 8–23)
CO2: 23 mmol/L (ref 22–32)
Calcium: 7.4 mg/dL — ABNORMAL LOW (ref 8.9–10.3)
Chloride: 102 mmol/L (ref 98–111)
Creatinine, Ser: 0.71 mg/dL (ref 0.61–1.24)
GFR, Estimated: >60 mL/min (ref >60)
Glucose, Bld: 91 mg/dL (ref 70–99)
Potassium: 3.1 mmol/L — ABNORMAL LOW (ref 3.5–5.1)
Sodium: 135 mmol/L (ref 135–145)
Total Bilirubin: 1.9 mg/dL — ABNORMAL HIGH (ref 0.3–1.2)
Total Protein: 5.5 g/dL — ABNORMAL LOW (ref 6.5–8.1)

## 2022-11-21 LAB — MAGNESIUM: Magnesium: 2 mg/dL (ref 1.7–2.4)

## 2022-11-21 MED ORDER — SODIUM CHLORIDE (HYPERTONIC) 5 % OP SOLN
1.0000 [drp] | Freq: Every day | OPHTHALMIC | Status: DC
  Administered 2022-11-21 – 2022-12-09 (×19): 1 [drp] via OPHTHALMIC
  Filled 2022-11-21: qty 15

## 2022-11-21 MED ORDER — SENNOSIDES-DOCUSATE SODIUM 8.6-50 MG PO TABS
1.0000 | ORAL_TABLET | Freq: Every day | ORAL | Status: DC
  Administered 2022-11-21 – 2022-12-01 (×9): 1 via ORAL
  Filled 2022-11-21 (×11): qty 1

## 2022-11-21 MED ORDER — INDOMETHACIN 50 MG RE SUPP
100.0000 mg | Freq: Once | RECTAL | Status: DC
  Filled 2022-11-21 (×2): qty 2

## 2022-11-21 MED ORDER — SENNOSIDES-DOCUSATE SODIUM 8.6-50 MG PO TABS: 1.0000 | ORAL_TABLET | Freq: Two times a day (BID) | ORAL | Status: DC

## 2022-11-21 MED ORDER — SODIUM CHLORIDE 0.9 % IV SOLN: INTRAVENOUS | Status: DC

## 2022-11-21 MED ORDER — POTASSIUM CHLORIDE 20 MEQ PO PACK
40.0000 meq | PACK | Freq: Once | ORAL | Status: AC
  Administered 2022-11-21: 40 meq via ORAL
  Filled 2022-11-21: qty 2

## 2022-11-21 NOTE — Progress Notes (Signed)
[facility] night cross cover note:   I was notified by RN that patient's potassium level this morning is 3.1.  I subsequently ordered liquid potassium chloride 40 meq p.o. x 1 dose now.     Newton Pigg, DO Hospitalist

## 2022-11-21 NOTE — H&P (View-Only) (Signed)
    Gastroenterology Progress Note  CC:  Pancreatitis and CBD stone   Subjective:   He is feeling better today, still with pain, but better.  More active today, tolerating small amounts of full liquids.    Objective:  Vital signs in last 24 hours: Temp:  [97.6 F (36.4 C)-98 F (36.7 C)] 97.6 F (36.4 C) (05/19 0754) Pulse Rate:  [63-106] 106 (05/19 0754) Resp:  [14-20] 14 (05/19 0754) BP: (132-159)/(72-94) 132/94 (05/19 0754) SpO2:  [92 %-93 %] 92 % (05/19 0754) Last BM Date : 11/20/22 General:  Alert, Well-developed, in NAD Heart:  Regular rate and rhythm; no murmurs Pulm:  CTAB.  No W/R/R. Abdomen:  Soft, non-distended.  BS present but quiet.  Epigastric TTP but less than previous. Extremities:  Without edema. Neurologic:  Alert and oriented x 4;  grossly normal neurologically. Psych:  Alert and cooperative. Normal mood and affect.  Intake/Output from previous day: 05/18 0701 - 05/19 0700 In: 454.8 [P.O.:60; IV Piggyback:394.8] Out: -   Lab Results: Recent Labs    11/19/22 0905 11/20/22 0754 11/21/22 0237  WBC 20.6* 16.6* 15.8*  HGB 13.4 12.7* 11.9*  HCT 41.3 39.0 36.4*  PLT 172 183 179   BMET Recent Labs    11/19/22 0905 11/20/22 0754 11/21/22 0237  NA 135 135 135  K 3.9 4.0 3.1*  CL 100 101 102  CO2 26 27 23  GLUCOSE 124* 88 91  BUN 17 17 15  CREATININE 0.89 0.90 0.71  CALCIUM 7.9* 7.8* 7.4*   LFT Recent Labs    11/21/22 0237  PROT 5.5*  ALBUMIN 2.2*  AST 25  ALT 47*  ALKPHOS 57  BILITOT 1.9*    DG Abd Portable 1V  Result Date: 11/19/2022 CLINICAL DATA:  Nausea vomiting EXAM: PORTABLE ABDOMEN - 1 VIEW COMPARISON:  03/09/2022, CT 11/16/2022 FINDINGS: Nonobstructed gas pattern with mild to moderate stool. No radiopaque calculi. CT demonstrated right UVJ stone not well seen radiographically. There are phleboliths in the pelvis. IMPRESSION: Negative. Electronically Signed   By: Kim  Fujinaga M.D.   On: 11/19/2022 15:26    Assessment  / Plan: *Acute gallstone pancreatitis with a 4 mm CBD stone in the CBD, not obstructing at this time.  Lipase trending down from >10K-->385-->96->39.  LFTs/total bili trending down overall.  LR going at 100 cc/hour. *Leukocytosis:  WBC count 15.8K. *Hypokalemia:  K+ 3.1 today.  Being corrected by primary service.   -Continue supportive care for pancreatitis with IVFs (decreased from 200cc/hr to 100 cc/hr on 5/17), pain control, etc. -ERCP at some point once pancreatitis is improved (suspect maybe not until early next week), ? Monday. -Continue abx in the form of Zosyn. -Continue full liquids for now, was advanced on 5/18. -Trend labs including WBC count, LFTs, etc. -Surgery is following as well, appreciate their input.   LOS: 4 days   Jessica D. Zehr  11/21/2022, 9:03 AM  GI ATTENDING  Interval history and data reviewed.  Patient personally seen and examined.  Wife and daughter in room this afternoon.  Patient is feeling better.  Ambulating.  Took a shower.  Still reports subjective pain.  Abdominal exam benign.  All parameters continue to improve.  Potassium being corrected.  On Zosyn.  Plan for ERCP tomorrow afternoon to address nonobstructing small common duct stone and the patient recovering from gallstone pancreatitis.The nature of the procedure, as well as the risks, benefits, and alternatives were carefully and thoroughly reviewed with the patient. Ample time for   discussion and questions allowed. The patient understood, was satisfied, and agreed to proceed.  He will need his gallbladder out, thereafter, prior to discharge.  Dejanay Wamboldt N. Sherre Wooton, Jr., M.D. [facility] Division of Gastroenterology    

## 2022-11-21 NOTE — Progress Notes (Addendum)
[facility] Progress Note  CC:  Pancreatitis and CBD stone   Subjective:   He is feeling better today, still with pain, but better.  More active today, tolerating small amounts of full liquids.    Objective:  Vital signs in last 24 hours: Temp:  [97.6 F (36.4 C)-98 F (36.7 C)] 97.6 F (36.4 C) (05/19 0754) Pulse Rate:  [63-106] 106 (05/19 0754) Resp:  [14-20] 14 (05/19 0754) BP: (132-159)/(72-94) 132/94 (05/19 0754) SpO2:  [92 %-93 %] 92 % (05/19 0754) Last BM Date : 11/20/22 General:  Alert, Well-developed, in NAD Heart:  Regular rate and rhythm; no murmurs Pulm:  CTAB.  No W/R/R. Abdomen:  Soft, non-distended.  BS present but quiet.  Epigastric TTP but less than previous. Extremities:  Without edema. Neurologic:  Alert and oriented x 4;  grossly normal neurologically. Psych:  Alert and cooperative. Normal mood and affect.  Intake/Output from previous day: 05/18 0701 - 05/19 0700 In: 454.8 [P.O.:60; IV Piggyback:394.8] Out: -   Lab Results: Recent Labs    11/19/22 0905 11/20/22 0754 11/21/22 0237  WBC 20.6* 16.6* 15.8*  HGB 13.4 12.7* 11.9*  HCT 41.3 39.0 36.4*  PLT 172 183 179   BMET Recent Labs    11/19/22 0905 11/20/22 0754 11/21/22 0237  NA 135 135 135  K 3.9 4.0 3.1*  CL 100 101 102  CO2 26 27 23   GLUCOSE 124* 88 91  BUN 17 17 15   CREATININE 0.89 0.90 0.71  CALCIUM 7.9* 7.8* 7.4*   LFT Recent Labs    11/21/22 0237  PROT 5.5*  ALBUMIN 2.2*  AST 25  ALT 47*  ALKPHOS 57  BILITOT 1.9*    DG Abd Portable 1V  Result Date: 11/19/2022 CLINICAL DATA:  Nausea vomiting EXAM: PORTABLE ABDOMEN - 1 VIEW COMPARISON:  03/09/2022, CT 11/16/2022 FINDINGS: Nonobstructed gas pattern with mild to moderate stool. No radiopaque calculi. CT demonstrated right UVJ stone not well seen radiographically. There are phleboliths in the pelvis. IMPRESSION: Negative. Electronically Signed   By: Jasmine Pang M.D.   On: 11/19/2022 15:26    Assessment  / Plan: *Acute gallstone pancreatitis with a 4 mm CBD stone in the CBD, not obstructing at this time.  Lipase trending down from >10K-->385-->96->39.  LFTs/total bili trending down overall.  LR going at 100 cc/hour. *Leukocytosis:  WBC count 15.8K. *Hypokalemia:  K+ 3.1 today.  Being corrected by primary service.   -Continue supportive care for pancreatitis with IVFs (decreased from 200cc/hr to 100 cc/hr on 5/17), pain control, etc. -ERCP at some point once pancreatitis is improved (suspect maybe not until early next week), ? Monday. -Continue abx in the form of Zosyn. -Continue full liquids for now, was advanced on 5/18. -Trend labs including WBC count, LFTs, etc. -Surgery is following as well, appreciate their input.   LOS: 4 days   Princella Pellegrini. Zehr  11/21/2022, 9:03 AM  GI ATTENDING  Interval history and data reviewed.  Patient personally seen and examined.  Wife and daughter in room this afternoon.  Patient is feeling better.  Ambulating.  Took a shower.  Still reports subjective pain.  Abdominal exam benign.  All parameters continue to improve.  Potassium being corrected.  On Zosyn.  Plan for ERCP tomorrow afternoon to address nonobstructing small common duct stone and the patient recovering from gallstone pancreatitis.The nature of the procedure, as well as the risks, benefits, and alternatives were carefully and thoroughly reviewed with the patient. Ample time for  discussion and questions allowed. The patient understood, was satisfied, and agreed to proceed.  He will need his gallbladder out, thereafter, prior to discharge.  Wilhemina Bonito. Eda Keys., M.D. [facility] Division of Gastroenterology

## 2022-11-21 NOTE — Progress Notes (Signed)
[facility] Progress Note Patient: Jon Bray ZOX:[id number] DOB: [birth year] DOA: 11/17/2022  DOS: the patient was seen and examined on 11/21/2022  Brief hospital course: Jon Bray is a 82 y.o. male with PMH significant of CAD 2014. Present to the hospital with complaints of abdominal pain and nausea and vomiting ongoing for last 3 days. Originally seen at [facility] ER on 5/14.  Reportedly had issues with his gallbladder ongoing for last 1 month.  Has seen general surgery in April and was recommended to undergo cholecystectomy. GI and general surgery consulted.  Currently conservative management recommended with eventual plan for ERCP and lap chole. Assessment and Plan: Acute gallstone pancreatitis Concern for cholangitis. Leukocytosis. Presents with complaints of abdominal pain. CT abdomen shows evidence of pancreatitis.  Without necrosis or infection. MRCP showed choledocholithiasis with 4 mm stone in the distal CBD.  CBD 8 mm. On admission lipase more than 10,000,has mild leukocytosis, LFTs are 200 and Bilirubin 2.5. Lipase level improving. AST and ALT improving as well. Bilirubin still elevated. [facility] consulted.  Currently recommending ERCP possibly next week pending pain improvement.  I have informed them currently I do not see any orders for n.p.o. status after midnight or proceeding of ERCP on the schedule. General surgery also consulted.  Awaiting for improvement in pancreatitis. Tolerating full liquid diet. IV Zofran and IV Dilaudid for pain control and nausea control. No prohibitive risk for surgery or ERCP so far. Currently on NS.  Will discontinue IV fluid.   Mild renal insufficiency. Baseline serum creatinine appears to be around 0.7-1. Creatinine trending up to 1.1.  BUN trending up to 24. Improving with aggressive IV hydration.   CAD (coronary artery disease) History in 2014. Currently no anginal symptoms. Patient actually have been fairly active without any  anginal symptoms On exertion. Currently holding aspirin.   GERD (gastroesophageal reflux disease) Reported brown-colored emesis. Currently receiving IV Protonix. Monitor.   Hyperlipidemia, mixed Holding statin.   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 Hemoglobin A1c 5.8.  In the prediabetes range.   Mild hypokalemia.  Mild hyperkalemia. Replace as necessary. Monitor.   Patchy groundglass opacities in lungs and the CT scan Possible aspiration pneumonitis. Currently no fever.  No chest pain.  No cough. Repeat chest x-ray on 5/19 shows possible left small pleural effusion but no significant volume overload. Continue incentive spirometry. Most likely aspiration pneumonitis Patient already on antibiotics.   Constipation. X-ray abdomen performed on 5/17 shows evidence of constipation. Continue simethicone.  Continue bowel regimen.   Subjective: Abdominal pain still present but improving.  No nausea no vomiting at the time of my interview.  Had BM yesterday.  Tolerating full liquid diet without any significant worsening of pain but did have some nausea initially without any vomiting.  Physical Exam: General: in Mild distress, No Rash Cardiovascular: S1 and S2 Present, No Murmur Respiratory: Good respiratory effort, Bilateral Air entry present.  Bilateral crackles, No wheezes Abdomen: Bowel Sound present, mild epigastric and central abdomen tenderness Extremities: No edema Neuro: Alert and oriented x3, no new focal deficit  Data Reviewed: I have Reviewed nursing notes, Vitals, and Lab results. Since last encounter, pertinent lab results CBC and BMP   . I have ordered test including CBC and BMP  . I have ordered imaging chest x-ray  .  Disposition: Status is: Inpatient Remains inpatient appropriate because: Need ERCP and lap chole before discharge. enoxaparin (LOVENOX) injection 40 mg Start: 11/20/22 1445 SCDs Start: 11/17/22 1732  Family Communication: Family at bedside Level of care: Med-Surg   Vitals:   11/20/22 1700 11/20/22 1930 11/21/22 0334 11/21/22 0754  BP: (!) 147/72 132/78 (!) 159/89 (!) 132/94  Pulse: 63 83 93 (!) 106  Resp: 18 20 14 14   Temp:  98 F (36.7 C) 97.6 F (36.4 C) 97.6 F (36.4 C)  TempSrc:  Oral Oral Oral  SpO2: 93% 93% 92% 92%  Weight:      Height:         Author: Lynden Oxford, MD 11/21/2022 10:05 AM  Please look on [email or web address] to find out who is on call.

## 2022-11-21 NOTE — Plan of Care (Signed)

## 2022-11-22 ENCOUNTER — Encounter (HOSPITAL_COMMUNITY)
Disposition: A | Discharge status: Home / Self Care | Payer: Self-pay | Source: Other Acute Inpatient Hospital | Attending: Internal Medicine

## 2022-11-22 ENCOUNTER — Inpatient Hospital Stay (HOSPITAL_COMMUNITY): Payer: HMO

## 2022-11-22 ENCOUNTER — Inpatient Hospital Stay (HOSPITAL_COMMUNITY): Payer: HMO | Admitting: Anesthesiology

## 2022-11-22 ENCOUNTER — Encounter (HOSPITAL_COMMUNITY): Payer: Self-pay | Admitting: Internal Medicine

## 2022-11-22 DIAGNOSIS — K859 Acute pancreatitis without necrosis or infection, unspecified: Secondary | ICD-10-CM | POA: Diagnosis not present

## 2022-11-22 DIAGNOSIS — I2510 Atherosclerotic heart disease of native coronary artery without angina pectoris: Secondary | ICD-10-CM

## 2022-11-22 DIAGNOSIS — E1151 Type 2 diabetes mellitus with diabetic peripheral angiopathy without gangrene: Secondary | ICD-10-CM | POA: Diagnosis not present

## 2022-11-22 DIAGNOSIS — I4891 Unspecified atrial fibrillation: Secondary | ICD-10-CM

## 2022-11-22 DIAGNOSIS — K851 Biliary acute pancreatitis without necrosis or infection: Secondary | ICD-10-CM | POA: Diagnosis not present

## 2022-11-22 HISTORY — PX: ENDOSCOPIC RETROGRADE CHOLANGIOPANCREATOGRAPHY (ERCP) WITH PROPOFOL: SHX5810

## 2022-11-22 LAB — CBC WITH DIFFERENTIAL/PLATELET
Abs Immature Granulocytes: 0.11 10*3/uL — ABNORMAL HIGH (ref 0.00–0.07)
Basophils Absolute: 0.1 10*3/uL (ref 0.0–0.1)
Basophils Relative: 0 %
Eosinophils Absolute: 0.2 10*3/uL (ref 0.0–0.5)
Eosinophils Relative: 1 %
HCT: 40.1 % (ref 39.0–52.0)
Hemoglobin: 12.9 g/dL — ABNORMAL LOW (ref 13.0–17.0)
Immature Granulocytes: 1 %
Lymphocytes Relative: 22 %
Lymphs Abs: 3.9 10*3/uL (ref 0.7–4.0)
MCH: 28.8 pg (ref 26.0–34.0)
MCHC: 32.2 g/dL (ref 30.0–36.0)
MCV: 89.5 fL (ref 80.0–100.0)
Monocytes Absolute: 1.4 10*3/uL — ABNORMAL HIGH (ref 0.1–1.0)
Monocytes Relative: 8 %
Neutro Abs: 12.1 10*3/uL — ABNORMAL HIGH (ref 1.7–7.7)
Neutrophils Relative %: 68 %
Platelets: 210 10*3/uL (ref 150–400)
RBC: 4.48 MIL/uL (ref 4.22–5.81)
RDW: 15.1 % (ref 11.5–15.5)
WBC: 17.8 10*3/uL — ABNORMAL HIGH (ref 4.0–10.5)
nRBC: 0 % (ref 0.0–0.2)

## 2022-11-22 LAB — COMPREHENSIVE METABOLIC PANEL
ALT: 40 U/L (ref 0–44)
AST: 26 U/L (ref 15–41)
Albumin: 2.2 g/dL — ABNORMAL LOW (ref 3.5–5.0)
Alkaline Phosphatase: 58 U/L (ref 38–126)
Anion gap: 9 (ref 5–15)
BUN: 14 mg/dL (ref 8–23)
CO2: 25 mmol/L (ref 22–32)
Calcium: 7.7 mg/dL — ABNORMAL LOW (ref 8.9–10.3)
Chloride: 99 mmol/L (ref 98–111)
Creatinine, Ser: 0.88 mg/dL (ref 0.61–1.24)
GFR, Estimated: >60 mL/min (ref >60)
Glucose, Bld: 109 mg/dL — ABNORMAL HIGH (ref 70–99)
Potassium: 4 mmol/L (ref 3.5–5.1)
Sodium: 133 mmol/L — ABNORMAL LOW (ref 135–145)
Total Bilirubin: 1.9 mg/dL — ABNORMAL HIGH (ref 0.3–1.2)
Total Protein: 6 g/dL — ABNORMAL LOW (ref 6.5–8.1)

## 2022-11-22 LAB — MAGNESIUM: Magnesium: 2 mg/dL (ref 1.7–2.4)

## 2022-11-22 SURGERY — ENDOSCOPIC RETROGRADE CHOLANGIOPANCREATOGRAPHY (ERCP) WITH PROPOFOL
Anesthesia: General

## 2022-11-22 MED ORDER — FENTANYL CITRATE (PF) 100 MCG/2ML IJ SOLN
25.0000 ug | INTRAMUSCULAR | Status: DC | PRN
Start: 2022-11-22 — End: 2022-11-22

## 2022-11-22 MED ORDER — ACETAMINOPHEN 325 MG PO TABS
650.0000 mg | ORAL_TABLET | Freq: Four times a day (QID) | ORAL | Status: DC | PRN
  Administered 2022-12-04: 650 mg via ORAL
  Filled 2022-11-22: qty 2

## 2022-11-22 MED ORDER — DEXAMETHASONE SODIUM PHOSPHATE 10 MG/ML IJ SOLN: INTRAMUSCULAR | Status: DC | PRN

## 2022-11-22 MED ORDER — ENOXAPARIN SODIUM 40 MG/0.4ML IJ SOSY
40.0000 mg | PREFILLED_SYRINGE | INTRAMUSCULAR | Status: DC
  Administered 2022-11-23 – 2022-12-01 (×8): 40 mg via SUBCUTANEOUS
  Filled 2022-11-22 (×8): qty 0.4

## 2022-11-22 MED ORDER — DICLOFENAC SUPPOSITORY 100 MG
RECTAL | Status: AC
  Filled 2022-11-22: qty 1

## 2022-11-22 MED ORDER — PHENYLEPHRINE 80 MCG/ML (10ML) SYRINGE FOR IV PUSH (FOR BLOOD PRESSURE SUPPORT)
PREFILLED_SYRINGE | INTRAVENOUS | Status: DC | PRN
  Administered 2022-11-22 (×2): 80 ug via INTRAVENOUS

## 2022-11-22 MED ORDER — GLUCAGON HCL RDNA (DIAGNOSTIC) 1 MG IJ SOLR
INTRAMUSCULAR | Status: DC | PRN
  Administered 2022-11-22: .05 mg via INTRAVENOUS

## 2022-11-22 MED ORDER — DEXAMETHASONE SODIUM PHOSPHATE 10 MG/ML IJ SOLN
INTRAMUSCULAR | Status: DC | PRN
  Administered 2022-11-22: 4 mg via INTRAVENOUS

## 2022-11-22 MED ORDER — ONDANSETRON HCL 4 MG/2ML IJ SOLN
INTRAMUSCULAR | Status: DC | PRN
  Administered 2022-11-22: 4 mg via INTRAVENOUS

## 2022-11-22 MED ORDER — DICLOFENAC SUPPOSITORY 100 MG
RECTAL | Status: DC | PRN
  Administered 2022-11-22: 100 mg via RECTAL

## 2022-11-22 MED ORDER — LIDOCAINE 2% (20 MG/ML) 5 ML SYRINGE
INTRAMUSCULAR | Status: DC | PRN
  Administered 2022-11-22: 100 mg via INTRAVENOUS

## 2022-11-22 MED ORDER — HYDROMORPHONE HCL 1 MG/ML IJ SOLN
1.0000 mg | INTRAMUSCULAR | Status: DC | PRN
  Administered 2022-11-22 – 2022-12-08 (×51): 1 mg via INTRAVENOUS
  Filled 2022-11-22 (×54): qty 1

## 2022-11-22 MED ORDER — GADOBUTROL 1 MMOL/ML IV SOLN
7.0000 mL | Freq: Once | INTRAVENOUS | Status: AC | PRN
  Administered 2022-11-22: 7 mL via INTRAVENOUS

## 2022-11-22 MED ORDER — OXYCODONE HCL 5 MG PO TABS
5.0000 mg | ORAL_TABLET | ORAL | Status: DC | PRN
  Administered 2022-11-22 – 2022-12-05 (×27): 5 mg via ORAL
  Filled 2022-11-22 (×30): qty 1

## 2022-11-22 MED ORDER — ACETAMINOPHEN 500 MG PO TABS: 1000.0000 mg | ORAL_TABLET | Freq: Three times a day (TID) | ORAL | Status: DC

## 2022-11-22 MED ORDER — ACETAMINOPHEN 650 MG RE SUPP: 650.0000 mg | Freq: Four times a day (QID) | RECTAL | Status: DC | PRN

## 2022-11-22 MED ORDER — GLUCAGON HCL RDNA (DIAGNOSTIC) 1 MG IJ SOLR
INTRAMUSCULAR | Status: AC
  Filled 2022-11-22: qty 1

## 2022-11-22 MED ORDER — PROPOFOL 10 MG/ML IV BOLUS
INTRAVENOUS | Status: DC | PRN
  Administered 2022-11-22: 110 mg via INTRAVENOUS

## 2022-11-22 MED ORDER — HYDROMORPHONE HCL 1 MG/ML IJ SOLN: 0.5000 mg | INTRAMUSCULAR | Status: DC | PRN

## 2022-11-22 MED ORDER — ACETAMINOPHEN 325 MG PO TABS: 650.0000 mg | ORAL_TABLET | Freq: Four times a day (QID) | ORAL | Status: DC | PRN

## 2022-11-22 MED ORDER — LACTATED RINGERS IV SOLN: INTRAVENOUS | Status: DC | PRN

## 2022-11-22 MED ORDER — SUGAMMADEX SODIUM 200 MG/2ML IV SOLN
INTRAVENOUS | Status: DC | PRN
  Administered 2022-11-22: 200 mg via INTRAVENOUS

## 2022-11-22 MED ORDER — ROCURONIUM BROMIDE 10 MG/ML (PF) SYRINGE: PREFILLED_SYRINGE | INTRAVENOUS | Status: DC | PRN

## 2022-11-22 MED ORDER — ROCURONIUM BROMIDE 10 MG/ML (PF) SYRINGE
PREFILLED_SYRINGE | INTRAVENOUS | Status: DC | PRN
  Administered 2022-11-22: 60 mg via INTRAVENOUS

## 2022-11-22 MED ORDER — PHENYLEPHRINE HCL-NACL 20-0.9 MG/250ML-% IV SOLN
INTRAVENOUS | Status: DC | PRN
  Administered 2022-11-22: 100 ug/min via INTRAVENOUS

## 2022-11-22 NOTE — Interval H&P Note (Signed)
History and Physical Interval Note:  11/22/2022 2:22 PM  Jon Bray  has presented today for surgery, with the diagnosis of CBD stone, pancreatitis.  The various methods of treatment have been discussed with the patient and family. After consideration of risks, benefits and other options for treatment, the patient has consented to  Procedure(s): ENDOSCOPIC RETROGRADE CHOLANGIOPANCREATOGRAPHY (ERCP) WITH PROPOFOL (N/A) as a surgical intervention.  The patient's history has been reviewed, patient examined, no change in status, stable for surgery.  I have reviewed the patient's chart and labs.  Questions were answered to the patient's satisfaction.     Stan Head

## 2022-11-22 NOTE — Progress Notes (Signed)
Subjective: CC: Stable pain in epigastrium/across upper abdomen. Temporarily relieved by dilaudid. States when he drinks liquids/full liquids its causes nausea and feels heavy. +flatus. Denies BM in 4 days.   Afebrile. No tachycardia or hypotension. WBC 217.8 from 15.8, T.bili 1.9, Lipase  Objective: Vital signs in last 24 hours: Temp:  [98.2 F (36.8 C)-98.7 F (37.1 C)] 98.2 F (36.8 C) (05/20 0735) Pulse Rate:  [52-102] 102 (05/20 0735) Resp:  [16-18] 16 (05/20 0735) BP: (133-154)/(84-92) 154/92 (05/20 0735) SpO2:  [94 %-96 %] 95 % (05/20 0735) Last BM Date : 11/20/22  Intake/Output from previous day: 05/19 0701 - 05/20 0700 In: 50 [IV Piggyback:50] Out: -  Intake/Output this shift: No intake/output data recorded.  PE: Gen:  Alert, NAD, pleasant Abd: Soft, ND, epigastric tenderness to light palpation, +BS  Lab Results:  Recent Labs    11/21/22 0237 11/22/22 0127  WBC 15.8* 17.8*  HGB 11.9* 12.9*  HCT 36.4* 40.1  PLT 179 210   BMET Recent Labs    11/21/22 0237 11/22/22 0127  NA 135 133*  K 3.1* 4.0  CL 102 99  CO2 23 25  GLUCOSE 91 109*  BUN 15 14  CREATININE 0.71 0.88  CALCIUM 7.4* 7.7*   PT/INR No results for input(s): "LABPROT", "INR" in the last 72 hours.  CMP     Component Value Date/Time   NA 133 (L) 11/22/2022 0127   NA 138 05/12/2013 0759   K 4.0 11/22/2022 0127   K 3.7 05/12/2013 0759   CL 99 11/22/2022 0127   CL 105 05/12/2013 0759   CO2 25 11/22/2022 0127   CO2 25 05/12/2013 0759   GLUCOSE 109 (H) 11/22/2022 0127   GLUCOSE 199 (H) 05/12/2013 0759   BUN 14 11/22/2022 0127   BUN 18 05/12/2013 0759   CREATININE 0.88 11/22/2022 0127   CREATININE 1.08 05/12/2013 0759   CALCIUM 7.7 (L) 11/22/2022 0127   CALCIUM 8.8 05/12/2013 0759   PROT 6.0 (L) 11/22/2022 0127   PROT 8.0 05/12/2013 0759   ALBUMIN 2.2 (L) 11/22/2022 0127   ALBUMIN 4.0 05/12/2013 0759   AST 26 11/22/2022 0127   AST 22 05/12/2013 0759   ALT 40 11/22/2022  0127   ALT 32 05/12/2013 0759   ALKPHOS 58 11/22/2022 0127   ALKPHOS 79 05/12/2013 0759   BILITOT 1.9 (H) 11/22/2022 0127   BILITOT 1.0 05/12/2013 0759   GFRNONAA >60 11/22/2022 0127   GFRNONAA >60 05/12/2013 0759   GFRAA >60 12/07/2018 2227   GFRAA >60 05/12/2013 0759   Lipase     Component Value Date/Time   LIPASE 32 11/21/2022 0237    Studies/Results: DG CHEST PORT 1 VIEW  Result Date: 11/21/2022 CLINICAL DATA:  141880 SOB (shortness of breath) [id number] EXAM: PORTABLE CHEST - 1 VIEW COMPARISON:  11/16/2022 FINDINGS: Bibasilar airspace opacities left greater than right slightly increased from previous. Heart size and mediastinal contours are within normal limits. Aortic Atherosclerosis (ICD10-170.0). No effusion. Visualized bones unremarkable. IMPRESSION: Slight increase in bibasilar infiltrates or atelectasis. Electronically Signed   By: Corlis Leak M.D.   On: 11/21/2022 11:25    Anti-infectives: Anti-infectives (From admission, onward)    Start     Dose/Rate Route Frequency Ordered Stop   11/18/22 0600  piperacillin-tazobactam (ZOSYN) IVPB 3.375 g        3.375 g 12.5 mL/hr over 240 Minutes Intravenous Every 8 hours 11/17/22 1753     11/17/22 1845  piperacillin-tazobactam (ZOSYN) IVPB 3.375  g        3.375 g 100 mL/hr over 30 Minutes Intravenous  Once 11/17/22 1753 11/17/22 2012        Assessment/Plan Gallstone pancreatitis  Choledocholithiasis with elevated LFT's - GI following for Choledocholithiasis. They plan ERCP today.  - Continue medical management of Pancreatitis. No complicating features on CT or MRCP. Lipase improving. Still ttp on exam. - Once Choledocholithiasis has been addressed by GI and pancreatitis resolves, would recommend laparoscopic cholecystectomy during admission.  - No evidence of Cholecystitis on [place], CT or MRCP. Does not need abx from our standpoint - We will follow with you   FEN - NPO, IVF per [facility] VTE - SCDs, okay for chem ppx from a general  surgery standpoint.  ID - Zosyn per [facility] (see above)  I reviewed nursing notes, Consultant (GI) notes, hospitalist notes, last 24 h vitals and pain scores, last 48 h intake and output, last 24 h labs and trends, and last 24 h imaging results.   LOS: 5 days    Adam Phenix , [facility] 11/22/2022, 9:00 AM Please see Amion for pager number during day hours 7:00am-4:30pm

## 2022-11-22 NOTE — Op Note (Addendum)
[facility] Patient Name: Jon Bray Procedure Date : 11/22/2022 MRN: [id number] Attending MD: Iva Boop , MD, [id number] Date of Birth: 1940/12/06 CSN: [id number] Age: 82 Admit Type: Inpatient Procedure:                ERCP Indications:              Suspected bile duct stone(s), Acute pancreatitis Providers:                Iva Boop, MD, Fransisca Connors, Marja Kays, Technician Referring MD:             [place] Medicines:                General Anesthesia, On continuous, intermittent                            Zosyn; diclofenac 100 mg per rectum Complications:            No immediate complications. Estimated Blood Loss:     Estimated blood loss: none. Procedure:                Pre-Anesthesia Assessment:                           - Prior to the procedure, a History and Physical                            was performed, and patient medications and                            allergies were reviewed. The patient's tolerance of                            previous anesthesia was also reviewed. The risks                            and benefits of the procedure and the sedation                            options and risks were discussed with the patient.                            All questions were answered, and informed consent                            was obtained. Prior Anticoagulants: The patient                            last took Lovenox (enoxaparin) 1 day prior to the                            procedure. ASA Grade Assessment: III - A patient  with severe systemic disease. After reviewing the                            risks and benefits, the patient was deemed in                            satisfactory condition to undergo the procedure.                           After obtaining informed consent, the scope was                            passed under direct vision. Throughout the                             procedure, the patient's blood pressure, pulse, and                            oxygen saturations were monitored continuously. The                            [employer] single use                            duodenoscope was introduced through the mouth, The                            procedure was aborted. The scope was not inserted.                            bile ducts Medications were duodenum. The ERCP was                            performed with difficulty due to challenging                            cannulation because of abnormal anatomy. The                            patient tolerated the procedure well. The [id number]-[id number]                            ([id number]) Olympus duodenoscope was introduced                            through the and used to inject contrast into. Scope In: Scope Out: Findings:      The scout film was normal. Esophagus not seen well. Stomach grossly       normal. Duodenum w/ edema, especially medial wall. Could not clearly ID       papilla. Area seen and suspected but no papillay orifice found, despite       using the sphincterotome to move folds and changed from Exalt to Olympus       duodenoscope. There was a small amount of  bile seen but again, no       papillary orifice identifed. Impression:               - The ERCP was aborted due to abnormal anatomy. Recommendation:           - Clear liquid diet (having increasing pain per                            wife so back to clears)                           Repeat MRCP to reassess - tomorrow (ordered)                           OK to resume Lovenox if no contraindications                           Will f/u w/ next steps after MR review                           Depending upon how things go he me need                            nasogastric/enteric tube and nutrition delivery                           Reviewed by phone w/ wife and daughter Procedure Code(s):        --- Professional  ---                           [phone], 52, Endoscopic retrograde                            cholangiopancreatography (ERCP); diagnostic,                            including collection of specimen(s) by brushing or                            washing, when performed (separate procedure) Diagnosis Code(s):        --- Professional ---                           K85.90, Acute pancreatitis without necrosis or                            infection, unspecified CPT copyright 2022 [employer] Association. All rights reserved. The codes documented in this report are preliminary and upon coder review may  be revised to meet current compliance requirements. Iva Boop, MD 11/22/2022 3:49:51 PM This report has been signed electronically. Number of Addenda: 0

## 2022-11-22 NOTE — Progress Notes (Signed)
Patient arrived back from MRI. Patient is alert and oriented x4, denies pain, VS stable with HR 104. Bed alarm was initiated, call bell within reach.   Jon Bray

## 2022-11-22 NOTE — Progress Notes (Signed)
[facility] Progress Note Patient: Jon Bray ZOX:[id number] DOB: 1941-01-27 DOA: 11/17/2022  DOS: the patient was seen and examined on 11/22/2022  Brief hospital course: Jon Bray is a 82 y.o. male with PMH significant of CAD 2014. Present to the hospital with complaints of abdominal pain and nausea and vomiting ongoing for last 3 days. Originally seen at [facility] ER on 5/14.  Reportedly had issues with his gallbladder ongoing for last 1 month.  Has seen general surgery in April and was recommended to undergo cholecystectomy. GI and general surgery consulted.   Underwent ERCP on 5/20 which was aborted due to inability to identify the papula.  Repeat MRCP ordered. Assessment and Plan: Acute gallstone pancreatitis Concern for cholangitis. Leukocytosis. Presents with complaints of abdominal pain. CT abdomen shows evidence of pancreatitis.  Without necrosis or infection. MRCP showed choledocholithiasis with 4 mm stone in the distal CBD.  CBD 8 mm. On admission lipase more than 10,000,has mild leukocytosis, LFTs are 200 and Bilirubin 2.5. Lipase level improving. AST and ALT improving as well. Bilirubin still elevated. [facility] consulted.   ERCP and attempted on 5/20 but had to be aborted as there was not able to identify the papilla secondary to duodenal edema. Repeat MRCP ordered. General surgery also consulted.  Awaiting for improvement in pancreatitis. IV Zofran and IV Dilaudid for pain control and nausea control. No prohibitive risk for surgery or ERCP so far.  Pain control continues to remain an issue.  Currently on Dilaudid 0.5 mg every 2 hours.  Increased to Dilaudid 1 mg every 2 hours and add oxycodone.  May require a PCA.  Follow-up on results of the MRCP.   Mild renal insufficiency. Resolved.   CAD (coronary artery disease) History in 2014. Currently no anginal symptoms. Patient actually have been fairly active without any anginal symptoms Currently holding aspirin.    GERD (gastroesophageal reflux disease) Reported brown-colored emesis admission.  H&H stable. Currently receiving IV Protonix. Monitor.   Hyperlipidemia, mixed Holding statin.   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 Hemoglobin A1c 5.8.  In the prediabetes range.   Mild hypokalemia.  Mild hyperkalemia. Replace as necessary. Monitor.   Patchy groundglass opacities in lungs and the CT scan Possible aspiration pneumonitis. Currently no fever.  No chest pain.  No cough. Repeat chest x-ray on 5/19 shows possible left small pleural effusion but no significant volume overload. Continue incentive spirometry. Most likely aspiration pneumonitis Patient already on antibiotics.   Constipation. X-ray abdomen performed on 5/17 shows evidence of constipation. Now improving.  Monitor.   Subjective: Abdominal pain still present.  No nausea no vomiting.  Passing gas.  No BM so far today.  Requiring frequent rounds of Dilaudid.  Physical Exam: In mild distress.  No rash. Clear to auscultation. S1-S2 present.  No murmur. Bowel sound present.  Midepigastric and central abdominal tenderness present. No edema. Alert awake and oriented  Data Reviewed: I have Reviewed nursing notes, Vitals, and Lab results. Since last encounter, pertinent lab results CBC and CMP   . I have ordered test including CBC and CMP  . I have discussed pt's care plan and test results with GI  .   Disposition: Status is: Inpatient Remains inpatient appropriate because: Need ERCP and lap chole before discharge. SCDs Start: 11/17/22 1732   Family Communication: Family at bedside Level of care: Med-Surg   Vitals:   11/22/22 1600 11/22/22 1610 11/22/22 1615 11/22/22 1632  BP: 122/80 131/89 117/81 129/72  Pulse: 98 98 97 (!) 103  Resp: 20 15 16 18   Temp:    98.4 F (36.9 C)  TempSrc:    Oral  SpO2: 94% 94% 95% 91%  Weight:      Height:         Author: Lynden Oxford,  MD 11/22/2022 6:00 PM  Please look on [email or web address] to find out who is on call.

## 2022-11-22 NOTE — Anesthesia Preprocedure Evaluation (Addendum)
Anesthesia Evaluation  Patient identified by MRN, date of birth, ID band Patient awake    Reviewed: Allergy & Precautions, NPO status , Patient's Chart, lab work & pertinent test results  History of Anesthesia Complications Negative for: history of anesthetic complications  Airway Mallampati: II  TM Distance: >3 FB Neck ROM: Full    Dental  (+) Edentulous Upper, Partial Lower   Pulmonary neg pulmonary ROS   Pulmonary exam normal        Cardiovascular + CAD, + Past MI (2014), + Cardiac Stents and + Peripheral Vascular Disease  Normal cardiovascular exam+ dysrhythmias Atrial Fibrillation      Neuro/Psych negative neurological ROS     GI/Hepatic Neg liver ROS,GERD  Medicated,,CBD stone, pancreatitis   Endo/Other  diabetes, Type 2    Renal/GU Renal InsufficiencyRenal disease     Musculoskeletal negative musculoskeletal ROS (+)    Abdominal   Peds  Hematology negative hematology ROS (+)   Anesthesia Other Findings Day of surgery medications reviewed with patient.  Reproductive/Obstetrics                             Anesthesia Physical Anesthesia Plan  ASA: 3  Anesthesia Plan: General   Post-op Pain Management: Minimal or no pain anticipated   Induction: Intravenous  PONV Risk Score and Plan: 2 and Ondansetron, Dexamethasone and Treatment may vary due to age or medical condition  Airway Management Planned: Oral ETT  Additional Equipment: None  Intra-op Plan:   Post-operative Plan: Extubation in OR  Informed Consent: I have reviewed the patients History and Physical, chart, labs and discussed the procedure including the risks, benefits and alternatives for the proposed anesthesia with the patient or authorized representative who has indicated his/her understanding and acceptance.     Dental advisory given  Plan Discussed with: CRNA  Anesthesia Plan Comments:          Anesthesia Quick Evaluation

## 2022-11-22 NOTE — Plan of Care (Signed)

## 2022-11-22 NOTE — Anesthesia Procedure Notes (Signed)
Procedure Name: Intubation Date/Time: 11/22/2022 2:27 PM  Performed by: Rachel Moulds, CRNAPre-anesthesia Checklist: Patient identified, Emergency Drugs available, Suction available, Patient being monitored and Timeout performed Patient Re-evaluated:Patient Re-evaluated prior to induction Oxygen Delivery Method: Circle system utilized Preoxygenation: Pre-oxygenation with 100% oxygen Induction Type: IV induction Ventilation: Mask ventilation without difficulty Laryngoscope Size: Mac and 4 Grade View: Grade I Tube type: Oral Tube size: 7.5 mm Number of attempts: 1 Airway Equipment and Method: Stylet Placement Confirmation: ETT inserted through vocal cords under direct vision, positive ETCO2, CO2 detector and breath sounds checked- equal and bilateral Secured at: 23 cm Tube secured with: Tape Dental Injury: Teeth and Oropharynx as per pre-operative assessment

## 2022-11-22 NOTE — Transfer of Care (Signed)
Immediate Anesthesia Transfer of Care Note  Patient: KEIGHAN SCHOENBECK  Procedure(s) Performed: ENDOSCOPIC RETROGRADE CHOLANGIOPANCREATOGRAPHY (ERCP) WITH PROPOFOL  Patient Location: Endoscopy Unit  Anesthesia Type:General  Level of Consciousness: drowsy  Airway & Oxygen Therapy: Patient Spontanous Breathing and Patient connected to nasal cannula oxygen  Post-op Assessment: Report given to RN and Post -op Vital signs reviewed and stable  Post vital signs: Reviewed and stable  Last Vitals:  Vitals Value Taken Time  BP 119/85   Temp    Pulse 95 11/22/22 1549  Resp 19 11/22/22 1549  SpO2 95 % 11/22/22 1549  Vitals shown include unvalidated device data.  Last Pain:  Vitals:   11/22/22 1351  TempSrc: Temporal  PainSc: 8       Patients Stated Pain Goal: 3 (11/22/22 1009)  Complications: No notable events documented.

## 2022-11-22 NOTE — Care Management Important Message (Signed)
Important Message  Patient Details  Name: Jon Bray MRN: [id number] Date of Birth: 09-17-1940   Medicare Important Message Given:  Yes     Dorena Bodo 11/22/2022, 2:46 PM

## 2022-11-23 DIAGNOSIS — K8050 Calculus of bile duct without cholangitis or cholecystitis without obstruction: Secondary | ICD-10-CM | POA: Diagnosis not present

## 2022-11-23 DIAGNOSIS — K851 Biliary acute pancreatitis without necrosis or infection: Secondary | ICD-10-CM | POA: Diagnosis not present

## 2022-11-23 LAB — CBC
HCT: 36.8 % — ABNORMAL LOW (ref 39.0–52.0)
Hemoglobin: 12.1 g/dL — ABNORMAL LOW (ref 13.0–17.0)
MCH: 28.8 pg (ref 26.0–34.0)
MCHC: 32.9 g/dL (ref 30.0–36.0)
MCV: 87.6 fL (ref 80.0–100.0)
Platelets: 227 10*3/uL (ref 150–400)
RBC: 4.2 MIL/uL — ABNORMAL LOW (ref 4.22–5.81)
RDW: 14.9 % (ref 11.5–15.5)
WBC: 13.2 10*3/uL — ABNORMAL HIGH (ref 4.0–10.5)
nRBC: 0 % (ref 0.0–0.2)

## 2022-11-23 LAB — COMPREHENSIVE METABOLIC PANEL
ALT: 32 U/L (ref 0–44)
AST: 23 U/L (ref 15–41)
Albumin: 2.1 g/dL — ABNORMAL LOW (ref 3.5–5.0)
Alkaline Phosphatase: 62 U/L (ref 38–126)
Anion gap: 7 (ref 5–15)
BUN: 24 mg/dL — ABNORMAL HIGH (ref 8–23)
CO2: 28 mmol/L (ref 22–32)
Calcium: 7.6 mg/dL — ABNORMAL LOW (ref 8.9–10.3)
Chloride: 99 mmol/L (ref 98–111)
Creatinine, Ser: 1 mg/dL (ref 0.61–1.24)
GFR, Estimated: >60 mL/min (ref >60)
Glucose, Bld: 154 mg/dL — ABNORMAL HIGH (ref 70–99)
Potassium: 3.5 mmol/L (ref 3.5–5.1)
Sodium: 134 mmol/L — ABNORMAL LOW (ref 135–145)
Total Bilirubin: 1.7 mg/dL — ABNORMAL HIGH (ref 0.3–1.2)
Total Protein: 6.1 g/dL — ABNORMAL LOW (ref 6.5–8.1)

## 2022-11-23 MED ORDER — HYDROCORTISONE 1 % EX CREA
TOPICAL_CREAM | Freq: Four times a day (QID) | CUTANEOUS | Status: DC
  Filled 2022-11-23 (×5): qty 28

## 2022-11-23 MED ORDER — SIMETHICONE 80 MG PO CHEW
80.0000 mg | CHEWABLE_TABLET | Freq: Four times a day (QID) | ORAL | Status: DC | PRN
  Administered 2022-11-23 – 2022-12-07 (×5): 80 mg via ORAL
  Filled 2022-11-23 (×7): qty 1

## 2022-11-23 MED ORDER — POLYETHYLENE GLYCOL 3350 17 G PO PACK
17.0000 g | PACK | Freq: Every day | ORAL | Status: DC
  Administered 2022-11-23 – 2022-11-25 (×3): 17 g via ORAL
  Filled 2022-11-23 (×3): qty 1

## 2022-11-23 NOTE — Progress Notes (Signed)
1 Day Post-Op  Subjective: Less abdominal pain but persistent pressure sensation in upper abdomen. Discussed MRI findings and plans to delay cholecystectomy for now. Wife and daughter present at bedside as well.   Objective: Vital signs in last 24 hours: Temp:  [97.7 F (36.5 C)-98.4 F (36.9 C)] 97.9 F (36.6 C) (05/21 0512) Pulse Rate:  [85-104] 85 (05/21 0512) Resp:  [15-20] 16 (05/21 0512) BP: (104-147)/(56-101) 118/72 (05/21 0512) SpO2:  [91 %-98 %] 98 % (05/21 0512) Weight:  [76.2 kg] 76.2 kg (05/20 1351) Last BM Date : 11/20/22  Intake/Output from previous day: 05/20 0701 - 05/21 0700 In: 840.5 [P.O.:300; I.V.:337.5; IV Piggyback:202.9] Out: -  Intake/Output this shift: No intake/output data recorded.  PE: Gen:  Alert, NAD, pleasant Abd: Soft, moderate distention, epigastric fullness on palpation, +BS  Lab Results:  Recent Labs    11/22/22 0127 11/23/22 0846  WBC 17.8* 13.2*  HGB 12.9* 12.1*  HCT 40.1 36.8*  PLT 210 227    BMET Recent Labs    11/22/22 0127 11/23/22 0846  NA 133* 134*  K 4.0 3.5  CL 99 99  CO2 25 28  GLUCOSE 109* 154*  BUN 14 24*  CREATININE 0.88 1.00  CALCIUM 7.7* 7.6*    PT/INR No results for input(s): "LABPROT", "INR" in the last 72 hours.  CMP     Component Value Date/Time   NA 134 (L) 11/23/2022 0846   NA 138 05/12/2013 0759   K 3.5 11/23/2022 0846   K 3.7 05/12/2013 0759   CL 99 11/23/2022 0846   CL 105 05/12/2013 0759   CO2 28 11/23/2022 0846   CO2 25 05/12/2013 0759   GLUCOSE 154 (H) 11/23/2022 0846   GLUCOSE 199 (H) 05/12/2013 0759   BUN 24 (H) 11/23/2022 0846   BUN 18 05/12/2013 0759   CREATININE 1.00 11/23/2022 0846   CREATININE 1.08 05/12/2013 0759   CALCIUM 7.6 (L) 11/23/2022 0846   CALCIUM 8.8 05/12/2013 0759   PROT 6.1 (L) 11/23/2022 0846   PROT 8.0 05/12/2013 0759   ALBUMIN 2.1 (L) 11/23/2022 0846   ALBUMIN 4.0 05/12/2013 0759   AST 23 11/23/2022 0846   AST 22 05/12/2013 0759   ALT 32  11/23/2022 0846   ALT 32 05/12/2013 0759   ALKPHOS 62 11/23/2022 0846   ALKPHOS 79 05/12/2013 0759   BILITOT 1.7 (H) 11/23/2022 0846   BILITOT 1.0 05/12/2013 0759   GFRNONAA >60 11/23/2022 0846   GFRNONAA >60 05/12/2013 0759   GFRAA >60 12/07/2018 2227   GFRAA >60 05/12/2013 0759   Lipase     Component Value Date/Time   LIPASE 32 11/21/2022 0237    Studies/Results: MR ABDOMEN MRCP W WO CONTAST  Result Date: 11/23/2022 CLINICAL DATA:  Gallstone pancreatitis. EXAM: MRI ABDOMEN WITHOUT AND WITH CONTRAST (INCLUDING MRCP) TECHNIQUE: Multiplanar multisequence MR imaging of the abdomen was performed both before and after the administration of intravenous contrast. Heavily T2-weighted images of the biliary and pancreatic ducts were obtained, and three-dimensional MRCP images were rendered by post processing. CONTRAST:  7mL GADAVIST GADOBUTROL 1 MMOL/ML IV SOLN COMPARISON:  11/16/2022 FINDINGS: Lower chest: Small bilateral pleural effusions associated with bibasilar collapse/consolidation. Hepatobiliary: No suspicious focal abnormality within the liver parenchyma. As on the prior study, there are numerous tiny dependent gallstones measuring in the 2-4 mm size range. 3 mm polypoid foci are seen along the mucosal surface of the gallbladder fundus, new in the interval. These may represent non dependent/buoyant stones or tiny cholesterol  polyps. No gallbladder wall thickening. There is pericholecystic fluid with associated fluid around the inferior liver. Common bile duct diameter is again measured at 8 mm, upper normal for patient age. There is mass-effect on the distal common bile duct with gradual narrowing to a string sign approximately 18 mm proximal to the ampulla. Duct then distends mildly into the ampulla where a 3 mm intraductal stone is visible (coronal MRCP image 28 of series 12). Pancreas: There is diffuse pancreatic and peripancreatic edema and fluid. Interval development of a subtle 16 mm fluid  collection in the head of the pancreas. No main duct dilatation. Pancreatic parenchyma enhances throughout after IV contrast administration. No focal or rim enhancing peripancreatic fluid collection. Spleen:  No splenomegaly. No focal mass lesion. Adrenals/Urinary Tract: No adrenal nodule or mass. Kidneys unremarkable. Stomach/Bowel: Stomach is unremarkable. No gastric wall thickening. No evidence of outlet obstruction. Duodenum is normally positioned as is the ligament of Treitz. No small bowel or colonic dilatation within the visualized abdomen. Vascular/Lymphatic: No abdominal aortic aneurysm. Portal vein, superior mesenteric vein, and splenic vein are patent. Celiac axis and SMA are unremarkable. Other: Small volume free fluid is seen around the inferior liver and in both pericolic gutters. Musculoskeletal: No focal suspicious marrow enhancement within the visualized bony anatomy. IMPRESSION: 1. Persistent diffuse pancreatic and peripancreatic edema and fluid compatible with acute pancreatitis. Interval development of a subtle 16 mm fluid collection in the head of the pancreas without rim enhancement. Evolving pseudocyst would be a consideration. No focal or rim enhancing peripancreatic fluid collection at this time. 2. Common bile duct again measures approximately 8 mm diameter, upper normal for patient age. Mass-effect on the distal common bile duct with gradual narrowing to a string sign approximately 18 mm proximal to the ampulla. Duct then distends mildly into the ampulla. 3 mm intraductal stone is visible just proximal to the ampulla. 3. Cholelithiasis. Pericholecystic fluid and fluid around the inferior liver . No gallbladder wall thickening. While these findings are nonspecific, the fluid is likely secondary to the pancreatitis. 4. Small bilateral pleural effusions associated with bibasilar collapse/consolidation. Electronically Signed   By: Kennith Center M.D.   On: 11/23/2022 05:49   DG C-Arm 1-60  Min-No Report  Result Date: 11/22/2022 Fluoroscopy was utilized by the requesting physician.  No radiographic interpretation.    Anti-infectives: Anti-infectives (From admission, onward)    Start     Dose/Rate Route Frequency Ordered Stop   11/18/22 0600  piperacillin-tazobactam (ZOSYN) IVPB 3.375 g        3.375 g 12.5 mL/hr over 240 Minutes Intravenous Every 8 hours 11/17/22 1753     11/17/22 1845  piperacillin-tazobactam (ZOSYN) IVPB 3.375 g        3.375 g 100 mL/hr over 30 Minutes Intravenous  Once 11/17/22 1753 11/17/22 2012        Assessment/Plan Gallstone pancreatitis  Choledocholithiasis with elevated LFT's - GI following for Choledocholithiasis. ERCP attempted 5/20 but aborted - MRCP this AM with choledocholithiasis and concern for possible developing pseudocyst  - Continue medical management of Pancreatitis.  - await further recs from GI regarding choledocholithiasis, would not recommend cholecystectomy acutely given now possible complicated pancreatitis  - No evidence of Cholecystitis on [place], CT or MRCP. Does not need abx from our standpoint - We will follow with you   FEN - CLD, IVF per [facility] VTE - SCDs, LMWH  ID - Zosyn per [facility] (see above)  I reviewed Consultant (GI) notes, hospitalist notes, last 24 h vitals  and pain scores, last 48 h intake and output, last 24 h labs and trends, and last 24 h imaging results.   LOS: 6 days    Juliet Rude , [facility] 11/23/2022, 10:24 AM Please see Amion for pager number during day hours 7:00am-4:30pm

## 2022-11-23 NOTE — Plan of Care (Signed)

## 2022-11-23 NOTE — Progress Notes (Signed)
Progress Note   Subjective  Patient states pain is better controlled on pain regimen, tolerating clear liquids, has a good appetite, wants to eat.   Objective   Vital signs in last 24 hours: Temp:  [97.7 F (36.5 C)-98.4 F (36.9 C)] 97.9 F (36.6 C) (05/21 0512) Pulse Rate:  [85-104] 85 (05/21 0512) Resp:  [15-20] 16 (05/21 0512) BP: (104-131)/(56-89) 118/72 (05/21 0512) SpO2:  [91 %-98 %] 98 % (05/21 0512) Last BM Date : 11/20/22 General:    white male in NAD Abdomen:  Soft, mild tenderness. Neurologic:  Alert and oriented,  grossly normal neurologically. Psych:  Cooperative. Normal mood and affect.  Intake/Output from previous day: 05/20 0701 - 05/21 0700 In: 840.5 [P.O.:300; I.V.:337.5; IV Piggyback:202.9] Out: -  Intake/Output this shift: No intake/output data recorded.  Lab Results: Recent Labs    11/21/22 0237 11/22/22 0127 11/23/22 0846  WBC 15.8* 17.8* 13.2*  HGB 11.9* 12.9* 12.1*  HCT 36.4* 40.1 36.8*  PLT 179 210 227   BMET Recent Labs    11/21/22 0237 11/22/22 0127 11/23/22 0846  NA 135 133* 134*  K 3.1* 4.0 3.5  CL 102 99 99  CO2 23 25 28   GLUCOSE 91 109* 154*  BUN 15 14 24*  CREATININE 0.71 0.88 1.00  CALCIUM 7.4* 7.7* 7.6*   LFT Recent Labs    11/23/22 0846  PROT 6.1*  ALBUMIN 2.1*  AST 23  ALT 32  ALKPHOS 62  BILITOT 1.7*   PT/INR No results for input(s): "LABPROT", "INR" in the last 72 hours.  Studies/Results: MR ABDOMEN MRCP W WO CONTAST  Result Date: 11/23/2022 CLINICAL DATA:  Gallstone pancreatitis. EXAM: MRI ABDOMEN WITHOUT AND WITH CONTRAST (INCLUDING MRCP) TECHNIQUE: Multiplanar multisequence MR imaging of the abdomen was performed both before and after the administration of intravenous contrast. Heavily T2-weighted images of the biliary and pancreatic ducts were obtained, and three-dimensional MRCP images were rendered by post processing. CONTRAST:  7mL GADAVIST GADOBUTROL 1 MMOL/ML IV SOLN COMPARISON:   11/16/2022 FINDINGS: Lower chest: Small bilateral pleural effusions associated with bibasilar collapse/consolidation. Hepatobiliary: No suspicious focal abnormality within the liver parenchyma. As on the prior study, there are numerous tiny dependent gallstones measuring in the 2-4 mm size range. 3 mm polypoid foci are seen along the mucosal surface of the gallbladder fundus, new in the interval. These may represent non dependent/buoyant stones or tiny cholesterol polyps. No gallbladder wall thickening. There is pericholecystic fluid with associated fluid around the inferior liver. Common bile duct diameter is again measured at 8 mm, upper normal for patient age. There is mass-effect on the distal common bile duct with gradual narrowing to a string sign approximately 18 mm proximal to the ampulla. Duct then distends mildly into the ampulla where a 3 mm intraductal stone is visible (coronal MRCP image 28 of series 12). Pancreas: There is diffuse pancreatic and peripancreatic edema and fluid. Interval development of a subtle 16 mm fluid collection in the head of the pancreas. No main duct dilatation. Pancreatic parenchyma enhances throughout after IV contrast administration. No focal or rim enhancing peripancreatic fluid collection. Spleen:  No splenomegaly. No focal mass lesion. Adrenals/Urinary Tract: No adrenal nodule or mass. Kidneys unremarkable. Stomach/Bowel: Stomach is unremarkable. No gastric wall thickening. No evidence of outlet obstruction. Duodenum is normally positioned as is the ligament of Treitz. No small bowel or colonic dilatation within the visualized abdomen. Vascular/Lymphatic: No abdominal aortic aneurysm. Portal vein, superior mesenteric vein, and splenic vein are  patent. Celiac axis and SMA are unremarkable. Other: Small volume free fluid is seen around the inferior liver and in both pericolic gutters. Musculoskeletal: No focal suspicious marrow enhancement within the visualized bony anatomy.  IMPRESSION: 1. Persistent diffuse pancreatic and peripancreatic edema and fluid compatible with acute pancreatitis. Interval development of a subtle 16 mm fluid collection in the head of the pancreas without rim enhancement. Evolving pseudocyst would be a consideration. No focal or rim enhancing peripancreatic fluid collection at this time. 2. Common bile duct again measures approximately 8 mm diameter, upper normal for patient age. Mass-effect on the distal common bile duct with gradual narrowing to a string sign approximately 18 mm proximal to the ampulla. Duct then distends mildly into the ampulla. 3 mm intraductal stone is visible just proximal to the ampulla. 3. Cholelithiasis. Pericholecystic fluid and fluid around the inferior liver . No gallbladder wall thickening. While these findings are nonspecific, the fluid is likely secondary to the pancreatitis. 4. Small bilateral pleural effusions associated with bibasilar collapse/consolidation. Electronically Signed   By: Kennith Center M.D.   On: 11/23/2022 05:49   DG C-Arm 1-60 Min-No Report  Result Date: 11/22/2022 Fluoroscopy was utilized by the requesting physician.  No radiographic interpretation.       Assessment / Plan:    82 y/o male here with the following:  Choledocholithiasis Gallstone pancreatitis  ERCP attempted yesterday to remove stone from the CBD. Unfortunately due to edema it was not successful and the procedure was aborted. Follow up MRCP done shows persistent small stone in the CBD, changes of pancreatitis noted along with small fluid collection.   Discussed the situation with the patient. His pain is better controlled. Liver enzymes continue to improve, WBC downtrending, he is getting better. Cr slightly higher today will need to follow this. Clinically he looks good today and seems very comfortable.  He will need another attempt at ERCP, question is when. Needs more time for edema to improve from acute inflammation.  Cholecystectomy has also been delayed due to this. He will need better nutrition in the interim, hasn't eaten for several days. He is asking to eat, feeing better and with good appetite. Okay to give him a trial of a low fat diet and see how he tolerates this. Will discuss his case with Dr. Meridee Score who is assuming the GI service next week, with the long weekend ahead it may be several days until we repeat ERCP attempt. Patient and family understand this and have a good understanding of the issue at hand.   Of note he is getting constipated with narcotic use, getting dulcolax and will add Miralax to take daily and titrate as needed.  PLAN: - low fat diet trial - trend labs - dulcolax suppository today, adding Miralax daily - will discuss his case with my advanced endoscopy colleagues, possible ERCP attempt next week. - cholecystectomy later in hospital course once CBD stone treated  Harlin Rain, MD [facility]

## 2022-11-23 NOTE — Anesthesia Postprocedure Evaluation (Signed)
Anesthesia Post Note  Patient: Jon Bray  Procedure(s) Performed: ENDOSCOPIC RETROGRADE CHOLANGIOPANCREATOGRAPHY (ERCP) WITH PROPOFOL     Patient location during evaluation: PACU Anesthesia Type: General Level of consciousness: awake and alert Pain management: pain level controlled Vital Signs Assessment: post-procedure vital signs reviewed and stable Respiratory status: spontaneous breathing, nonlabored ventilation, respiratory function stable and patient connected to nasal cannula oxygen Cardiovascular status: blood pressure returned to baseline and stable Postop Assessment: no apparent nausea or vomiting Anesthetic complications: no  No notable events documented.  Last Vitals:  Vitals:   11/23/22 0030 11/23/22 0512  BP: (!) 104/56 118/72  Pulse: 90 85  Resp: 16 16  Temp: 36.6 C 36.6 C  SpO2: 95% 98%    Last Pain:  Vitals:   11/23/22 0646  TempSrc:   PainSc: 5                  Hersey Maclellan S

## 2022-11-23 NOTE — Progress Notes (Signed)
[facility] Progress Note Patient: Jon Bray VHQ:[id number] DOB: 07/10/1940 DOA: 11/17/2022  DOS: the patient was seen and examined on 11/23/2022  Brief hospital course: Jon Bray is a 82 y.o. male with PMH significant of CAD 2014. Present to the hospital with complaints of abdominal pain and nausea and vomiting ongoing for last 3 days. Originally seen at [facility] ER on 5/14.  Reportedly had issues with his gallbladder ongoing for last 1 month.  Has seen general surgery in April and was recommended to undergo cholecystectomy. GI and general surgery consulted.   Underwent ERCP on 5/20 which was aborted due to inability to identify the papula.  Repeat MRCP ordered. Assessment and Plan: Acute gallstone pancreatitis Concern for cholangitis. Leukocytosis. Presents with complaints of abdominal pain. CT abdomen shows evidence of pancreatitis.  Without necrosis or infection. MRCP showed choledocholithiasis with 4 mm stone in the distal CBD.  CBD 8 mm. On admission lipase more than 10,000,has mild leukocytosis, LFTs are 200 and Bilirubin 2.5. Lipase level improving. AST and ALT improving as well. Bilirubin still elevated. [facility] consulted.   ERCP and attempted on 5/20 but had to be aborted as there was not able to identify the papilla secondary to duodenal edema. Repeat MRCP shows persistent small stone in CBD.  Early pseudocyst formation. Repeat ERCP recommended.  Will monitor. General surgery also consulted.  Awaiting for improvement in pancreatitis. IV Zofran and IV Dilaudid for pain control and nausea control. No prohibitive risk for surgery or ERCP so far. Diet per GI, initiating a low-fat diet. Pain control continues to remain an issue.  Currently on Dilaudid 0.5 mg every 2 hours.  Increased to Dilaudid 1 mg every 2 hours and add oxycodone.  May require a PCA.  Follow-up on results of the MRCP.  Rash on the back. Possible associated with newly linen material. Hydrocortisone  cream.  Monitor.   Mild renal insufficiency. Resolved.   CAD (coronary artery disease) History in 2014. Currently no anginal symptoms. Patient actually have been fairly active without any anginal symptoms Currently holding aspirin.   GERD (gastroesophageal reflux disease) Reported brown-colored emesis admission.  H&H stable. Currently receiving IV Protonix. Monitor.   Hyperlipidemia, mixed Holding statin.   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 Hemoglobin A1c 5.8.  In the prediabetes range.   Mild hypokalemia.  Mild hyperkalemia. Replace as necessary. Monitor.   Patchy groundglass opacities in lungs and the CT scan Possible aspiration pneumonitis. Currently no fever.  No chest pain.  No cough. Repeat chest x-ray on 5/19 shows possible left small pleural effusion but no significant volume overload. Continue incentive spirometry. Most likely aspiration pneumonitis Patient already on antibiotics.   Constipation. X-ray abdomen performed on 5/17 shows evidence of constipation. Now improving.  Monitor.   Subjective: Pain improving.  No nausea no vomiting no fever no chills.  Passing gas but no BM.  Reported rash in the afternoon.  Physical Exam: In mild distress.  Maculopapular rash on back. Clear to auscultation. S1-S2 present. Clear to auscultation. No edema. Continues to have epigastric tenderness.  Data Reviewed: I have Reviewed nursing notes, Vitals, and Lab results. Since last encounter, pertinent lab results CBC and CMP   . I have ordered test including CBC and CMP  . I have discussed pt's care plan and test results with GI  .    Disposition: Status is: Inpatient Remains inpatient appropriate because: Need ERCP and lap chole before discharge. enoxaparin (LOVENOX) injection 40 mg Start:  11/23/22 1000 SCDs Start: 11/17/22 1732   Family Communication: Family at bedside Level of care: Med-Surg   Vitals:   11/22/22 1909  11/23/22 0030 11/23/22 0512 11/23/22 1500  BP: 128/85 (!) 104/56 118/72 (!) 141/82  Pulse: (!) 104 90 85   Resp: 18 16 16    Temp: 98.3 F (36.8 C) 97.8 F (36.6 C) 97.9 F (36.6 C) 98 F (36.7 C)  TempSrc: Axillary Oral Oral Oral  SpO2: 96% 95% 98% 99%  Weight:      Height:         Author: Lynden Oxford, MD 11/23/2022 7:10 PM  Please look on [email or web address] to find out who is on call.

## 2022-11-24 DIAGNOSIS — K851 Biliary acute pancreatitis without necrosis or infection: Secondary | ICD-10-CM | POA: Diagnosis not present

## 2022-11-24 DIAGNOSIS — K8050 Calculus of bile duct without cholangitis or cholecystitis without obstruction: Secondary | ICD-10-CM | POA: Diagnosis not present

## 2022-11-24 LAB — COMPREHENSIVE METABOLIC PANEL
ALT: 31 U/L (ref 0–44)
AST: 22 U/L (ref 15–41)
Albumin: 2.1 g/dL — ABNORMAL LOW (ref 3.5–5.0)
Alkaline Phosphatase: 58 U/L (ref 38–126)
Anion gap: 7 (ref 5–15)
BUN: 21 mg/dL (ref 8–23)
CO2: 29 mmol/L (ref 22–32)
Calcium: 7.3 mg/dL — ABNORMAL LOW (ref 8.9–10.3)
Chloride: 98 mmol/L (ref 98–111)
Creatinine, Ser: 1.04 mg/dL (ref 0.61–1.24)
GFR, Estimated: >60 mL/min (ref >60)
Glucose, Bld: 157 mg/dL — ABNORMAL HIGH (ref 70–99)
Potassium: 3.2 mmol/L — ABNORMAL LOW (ref 3.5–5.1)
Sodium: 134 mmol/L — ABNORMAL LOW (ref 135–145)
Total Bilirubin: 1.1 mg/dL (ref 0.3–1.2)
Total Protein: 5.5 g/dL — ABNORMAL LOW (ref 6.5–8.1)

## 2022-11-24 LAB — CBC
HCT: 38.9 % — ABNORMAL LOW (ref 39.0–52.0)
Hemoglobin: 12.9 g/dL — ABNORMAL LOW (ref 13.0–17.0)
MCH: 28.6 pg (ref 26.0–34.0)
MCHC: 33.2 g/dL (ref 30.0–36.0)
MCV: 86.3 fL (ref 80.0–100.0)
Platelets: 275 10*3/uL (ref 150–400)
RBC: 4.51 MIL/uL (ref 4.22–5.81)
RDW: 14.8 % (ref 11.5–15.5)
WBC: 16.2 10*3/uL — ABNORMAL HIGH (ref 4.0–10.5)
nRBC: 0 % (ref 0.0–0.2)

## 2022-11-24 MED ORDER — POTASSIUM CHLORIDE 20 MEQ PO PACK
40.0000 meq | PACK | Freq: Once | ORAL | Status: AC
  Administered 2022-11-24: 40 meq via ORAL
  Filled 2022-11-24: qty 2

## 2022-11-24 NOTE — Progress Notes (Signed)
Progress Note   Subjective  Patient feeling okay. Eating low fat diet and tolerating it. Pain controlled with regimen. He denies complaints.   Objective   Vital signs in last 24 hours: Temp:  [98.1 F (36.7 C)] 98.1 F (36.7 C) (05/22 0344) Pulse Rate:  [77-105] 105 (05/22 0823) Resp:  [15-18] 15 (05/22 0823) BP: (96-131)/(65-87) 128/87 (05/22 0823) SpO2:  [94 %-95 %] 95 % (05/22 0823) Last BM Date : 11/23/22 General:    white male in NAD Neurologic:  Alert and oriented,  grossly normal neurologically. Psych:  Cooperative. Normal mood and affect.  Intake/Output from previous day: No intake/output data recorded. Intake/Output this shift: No intake/output data recorded.  Lab Results: Recent Labs    11/22/22 0127 11/23/22 0846 11/24/22 0332  WBC 17.8* 13.2* 16.2*  HGB 12.9* 12.1* 12.9*  HCT 40.1 36.8* 38.9*  PLT 210 227 275   BMET Recent Labs    11/22/22 0127 11/23/22 0846 11/24/22 0332  NA 133* 134* 134*  K 4.0 3.5 3.2*  CL 99 99 98  CO2 25 28 29   GLUCOSE 109* 154* 157*  BUN 14 24* 21  CREATININE 0.88 1.00 1.04  CALCIUM 7.7* 7.6* 7.3*   LFT Recent Labs    11/24/22 0332  PROT 5.5*  ALBUMIN 2.1*  AST 22  ALT 31  ALKPHOS 58  BILITOT 1.1   PT/INR No results for input(s): "LABPROT", "INR" in the last 72 hours.  Studies/Results: MR ABDOMEN MRCP W WO CONTAST  Result Date: 11/23/2022 CLINICAL DATA:  Gallstone pancreatitis. EXAM: MRI ABDOMEN WITHOUT AND WITH CONTRAST (INCLUDING MRCP) TECHNIQUE: Multiplanar multisequence MR imaging of the abdomen was performed both before and after the administration of intravenous contrast. Heavily T2-weighted images of the biliary and pancreatic ducts were obtained, and three-dimensional MRCP images were rendered by post processing. CONTRAST:  7mL GADAVIST GADOBUTROL 1 MMOL/ML IV SOLN COMPARISON:  11/16/2022 FINDINGS: Lower chest: Small bilateral pleural effusions associated with bibasilar collapse/consolidation.  Hepatobiliary: No suspicious focal abnormality within the liver parenchyma. As on the prior study, there are numerous tiny dependent gallstones measuring in the 2-4 mm size range. 3 mm polypoid foci are seen along the mucosal surface of the gallbladder fundus, new in the interval. These may represent non dependent/buoyant stones or tiny cholesterol polyps. No gallbladder wall thickening. There is pericholecystic fluid with associated fluid around the inferior liver. Common bile duct diameter is again measured at 8 mm, upper normal for patient age. There is mass-effect on the distal common bile duct with gradual narrowing to a string sign approximately 18 mm proximal to the ampulla. Duct then distends mildly into the ampulla where a 3 mm intraductal stone is visible (coronal MRCP image 28 of series 12). Pancreas: There is diffuse pancreatic and peripancreatic edema and fluid. Interval development of a subtle 16 mm fluid collection in the head of the pancreas. No main duct dilatation. Pancreatic parenchyma enhances throughout after IV contrast administration. No focal or rim enhancing peripancreatic fluid collection. Spleen:  No splenomegaly. No focal mass lesion. Adrenals/Urinary Tract: No adrenal nodule or mass. Kidneys unremarkable. Stomach/Bowel: Stomach is unremarkable. No gastric wall thickening. No evidence of outlet obstruction. Duodenum is normally positioned as is the ligament of Treitz. No small bowel or colonic dilatation within the visualized abdomen. Vascular/Lymphatic: No abdominal aortic aneurysm. Portal vein, superior mesenteric vein, and splenic vein are patent. Celiac axis and SMA are unremarkable. Other: Small volume free fluid is seen around the inferior liver and in  both pericolic gutters. Musculoskeletal: No focal suspicious marrow enhancement within the visualized bony anatomy. IMPRESSION: 1. Persistent diffuse pancreatic and peripancreatic edema and fluid compatible with acute pancreatitis.  Interval development of a subtle 16 mm fluid collection in the head of the pancreas without rim enhancement. Evolving pseudocyst would be a consideration. No focal or rim enhancing peripancreatic fluid collection at this time. 2. Common bile duct again measures approximately 8 mm diameter, upper normal for patient age. Mass-effect on the distal common bile duct with gradual narrowing to a string sign approximately 18 mm proximal to the ampulla. Duct then distends mildly into the ampulla. 3 mm intraductal stone is visible just proximal to the ampulla. 3. Cholelithiasis. Pericholecystic fluid and fluid around the inferior liver . No gallbladder wall thickening. While these findings are nonspecific, the fluid is likely secondary to the pancreatitis. 4. Small bilateral pleural effusions associated with bibasilar collapse/consolidation. Electronically Signed   By: Kennith Center M.D.   On: 11/23/2022 05:49   DG C-Arm 1-60 Min-No Report  Result Date: 11/22/2022 Fluoroscopy was utilized by the requesting physician.  No radiographic interpretation.       Assessment / Plan:    82 y/o male here with the following:  Choledocholithiasis Gallstone pancreatitis   ERCP attempted 5/20 to remove stone from the CBD. Unfortunately due to edema it was not successful and the procedure was aborted. Follow up MRCP done shows persistent small stone in the CBD, changes of pancreatitis noted along with small fluid collection.    I have discussed the situation with the patient and his wife at length yesterday. Plan on giving him more time for edema to go down and consider another ERCP early next week if possible (Dr. Meridee Score). His liver enzymes have NORMALIZED. WBC has risen slightly today. He is tolerating low fat diet and eating suprisingly well. He seems comfortable. He understands he may be in the hospital through early next week. If his LAEs remain normal and he continues to improve clinically can consider a repeat  MRCP in upcoming days to see if the stone may have passed spontaneously.   Of note he is getting constipated with narcotic use, getting dulcolax and added Miralax to take daily and titrate as needed.   PLAN: - continue low fat diet - trend labs - Miralax daily - possible ERCP attempt next week. - if LAES remain normal pending his course, consider repeat MRCP at some point to see if stone passed spontaneously - cholecystectomy later in hospital course once CBD stone treated  Harlin Rain, MD [facility]

## 2022-11-24 NOTE — Progress Notes (Signed)
2 Days Post-Op  Subjective: More pain upon waking this AM. Wife at bedside. Denies nausea or vomiting.   Objective: Vital signs in last 24 hours: Temp:  [98 F (36.7 C)-98.1 F (36.7 C)] 98.1 F (36.7 C) (05/22 0344) Pulse Rate:  [77-105] 105 (05/22 0823) Resp:  [15-18] 15 (05/22 0823) BP: (96-141)/(65-87) 128/87 (05/22 0823) SpO2:  [94 %-99 %] 95 % (05/22 0823) Last BM Date : 11/23/22  Intake/Output from previous day: No intake/output data recorded. Intake/Output this shift: No intake/output data recorded.  PE: Gen:  Alert, NAD, pleasant Abd: Soft, moderate distention, epigastric fullness and mild tenderness on palpation, +BS  Lab Results:  Recent Labs    11/23/22 0846 11/24/22 0332  WBC 13.2* 16.2*  HGB 12.1* 12.9*  HCT 36.8* 38.9*  PLT 227 275    BMET Recent Labs    11/23/22 0846 11/24/22 0332  NA 134* 134*  K 3.5 3.2*  CL 99 98  CO2 28 29  GLUCOSE 154* 157*  BUN 24* 21  CREATININE 1.00 1.04  CALCIUM 7.6* 7.3*    PT/INR No results for input(s): "LABPROT", "INR" in the last 72 hours.  CMP     Component Value Date/Time   NA 134 (L) 11/24/2022 0332   NA 138 05/12/2013 0759   K 3.2 (L) 11/24/2022 0332   K 3.7 05/12/2013 0759   CL 98 11/24/2022 0332   CL 105 05/12/2013 0759   CO2 29 11/24/2022 0332   CO2 25 05/12/2013 0759   GLUCOSE 157 (H) 11/24/2022 0332   GLUCOSE 199 (H) 05/12/2013 0759   BUN 21 11/24/2022 0332   BUN 18 05/12/2013 0759   CREATININE 1.04 11/24/2022 0332   CREATININE 1.08 05/12/2013 0759   CALCIUM 7.3 (L) 11/24/2022 0332   CALCIUM 8.8 05/12/2013 0759   PROT 5.5 (L) 11/24/2022 0332   PROT 8.0 05/12/2013 0759   ALBUMIN 2.1 (L) 11/24/2022 0332   ALBUMIN 4.0 05/12/2013 0759   AST 22 11/24/2022 0332   AST 22 05/12/2013 0759   ALT 31 11/24/2022 0332   ALT 32 05/12/2013 0759   ALKPHOS 58 11/24/2022 0332   ALKPHOS 79 05/12/2013 0759   BILITOT 1.1 11/24/2022 0332   BILITOT 1.0 05/12/2013 0759   GFRNONAA >60 11/24/2022  0332   GFRNONAA >60 05/12/2013 0759   GFRAA >60 12/07/2018 2227   GFRAA >60 05/12/2013 0759   Lipase     Component Value Date/Time   LIPASE 32 11/21/2022 0237    Studies/Results: MR ABDOMEN MRCP W WO CONTAST  Result Date: 11/23/2022 CLINICAL DATA:  Gallstone pancreatitis. EXAM: MRI ABDOMEN WITHOUT AND WITH CONTRAST (INCLUDING MRCP) TECHNIQUE: Multiplanar multisequence MR imaging of the abdomen was performed both before and after the administration of intravenous contrast. Heavily T2-weighted images of the biliary and pancreatic ducts were obtained, and three-dimensional MRCP images were rendered by post processing. CONTRAST:  7mL GADAVIST GADOBUTROL 1 MMOL/ML IV SOLN COMPARISON:  11/16/2022 FINDINGS: Lower chest: Small bilateral pleural effusions associated with bibasilar collapse/consolidation. Hepatobiliary: No suspicious focal abnormality within the liver parenchyma. As on the prior study, there are numerous tiny dependent gallstones measuring in the 2-4 mm size range. 3 mm polypoid foci are seen along the mucosal surface of the gallbladder fundus, new in the interval. These may represent non dependent/buoyant stones or tiny cholesterol polyps. No gallbladder wall thickening. There is pericholecystic fluid with associated fluid around the inferior liver. Common bile duct diameter is again measured at 8 mm, upper normal for patient age.  There is mass-effect on the distal common bile duct with gradual narrowing to a string sign approximately 18 mm proximal to the ampulla. Duct then distends mildly into the ampulla where a 3 mm intraductal stone is visible (coronal MRCP image 28 of series 12). Pancreas: There is diffuse pancreatic and peripancreatic edema and fluid. Interval development of a subtle 16 mm fluid collection in the head of the pancreas. No main duct dilatation. Pancreatic parenchyma enhances throughout after IV contrast administration. No focal or rim enhancing peripancreatic fluid  collection. Spleen:  No splenomegaly. No focal mass lesion. Adrenals/Urinary Tract: No adrenal nodule or mass. Kidneys unremarkable. Stomach/Bowel: Stomach is unremarkable. No gastric wall thickening. No evidence of outlet obstruction. Duodenum is normally positioned as is the ligament of Treitz. No small bowel or colonic dilatation within the visualized abdomen. Vascular/Lymphatic: No abdominal aortic aneurysm. Portal vein, superior mesenteric vein, and splenic vein are patent. Celiac axis and SMA are unremarkable. Other: Small volume free fluid is seen around the inferior liver and in both pericolic gutters. Musculoskeletal: No focal suspicious marrow enhancement within the visualized bony anatomy. IMPRESSION: 1. Persistent diffuse pancreatic and peripancreatic edema and fluid compatible with acute pancreatitis. Interval development of a subtle 16 mm fluid collection in the head of the pancreas without rim enhancement. Evolving pseudocyst would be a consideration. No focal or rim enhancing peripancreatic fluid collection at this time. 2. Common bile duct again measures approximately 8 mm diameter, upper normal for patient age. Mass-effect on the distal common bile duct with gradual narrowing to a string sign approximately 18 mm proximal to the ampulla. Duct then distends mildly into the ampulla. 3 mm intraductal stone is visible just proximal to the ampulla. 3. Cholelithiasis. Pericholecystic fluid and fluid around the inferior liver . No gallbladder wall thickening. While these findings are nonspecific, the fluid is likely secondary to the pancreatitis. 4. Small bilateral pleural effusions associated with bibasilar collapse/consolidation. Electronically Signed   By: Kennith Center M.D.   On: 11/23/2022 05:49   DG C-Arm 1-60 Min-No Report  Result Date: 11/22/2022 Fluoroscopy was utilized by the requesting physician.  No radiographic interpretation.    Anti-infectives: Anti-infectives (From admission, onward)     Start     Dose/Rate Route Frequency Ordered Stop   11/18/22 0600  piperacillin-tazobactam (ZOSYN) IVPB 3.375 g        3.375 g 12.5 mL/hr over 240 Minutes Intravenous Every 8 hours 11/17/22 1753     11/17/22 1845  piperacillin-tazobactam (ZOSYN) IVPB 3.375 g        3.375 g 100 mL/hr over 30 Minutes Intravenous  Once 11/17/22 1753 11/17/22 2012        Assessment/Plan Gallstone pancreatitis  Choledocholithiasis with elevated LFT's - GI following for Choledocholithiasis. ERCP attempted 5/20 but aborted - MRCP 5/21 with choledocholithiasis and concern for possible developing pseudocyst  - Continue medical management of Pancreatitis.  - GI planning repeat ERCP, likely next week - No evidence of Cholecystitis on [place], CT or MRCP. Does not need abx from our standpoint - We will follow peripherally but no plans for lap chole prior to ERCP presently, also may need delayed lap chole if seems to be developing more complicated pancreatitis    FEN - Low fat diet , IVF per [facility] VTE - SCDs, LMWH  ID - Zosyn per [facility] (see above)  I reviewed Consultant (GI) notes, hospitalist notes, last 24 h vitals and pain scores, last 48 h intake and output, and last 24 h labs and trends.  LOS: 7 days    Juliet Rude , [facility] 11/24/2022, 9:20 AM Please see Amion for pager number during day hours 7:00am-4:30pm

## 2022-11-24 NOTE — Progress Notes (Signed)
PROGRESS NOTE  HARINDER LOPEMAN  DOB: 15-Jul-1940  PCP: Danella Penton, MD ZOX:[id number]  DOA: 11/17/2022  LOS: 7 days  Hospital Day: 8  Brief narrative: Jon Bray is a 82 y.o. male with PMH significant for DM2, HTN, HLD, CAD/NSTEMI/stent 2014, paroxysmal A-fib 5/14, patient was brought to the ED at [facility] with complaint of worsening nausea, vomiting.   In March, patient saw his primary care provider for loss of appetite, weight loss. Workup showed elevated liver enzymes and bilirubin.  3/27 CT abdomen and pelvis showed focal wall thickening in the urinary bladder adjacent to right UVJ suspicious for neoplasm, also extensive sigmoid diverticulosis 3/28, MRI/MRCP showed choledocholithiasis with 2 tiny gallstones near the ampulla measuring 0.2 to 0.3 cm and mild intra and extrahepatic biliary duct dilatation, CBD dilated to 0.9 cm Patient refused to go to ED for urgent evaluation.  PCP sent referral to general surgery and GI 4/9, seen by surgeon Dr. Trisha Mangle.  Cholecystectomy was recommended but patient wanted to wait out.  5/14, patient was brought to the ED for 3 days of nausea, vomiting. Initial workup showed lipase elevated over 10,000, LFTs elevated as well MRI abdomen showed acute interstitial pancreatitis, choledocholithiasis with 4 mm stone in distal CBD. He was started on conservative management with IV fluid, IV analgesia, IV antiemetics, bowel rest Admitted to [facility] GI and general surgery were consulted. 5/20, ERCP was attempted but had to be aborted due to inability to identify the papula.  Follow-up MRCP showed persistent small stone in the CBD, changes of pancreatitis along with small fluid collection. Overall, liver enzymes continue to improve, Currently, patient is tentatively planned for ERCP by Dr. Meridee Score next week   Subjective: Patient was seen and examined this morning.  Pleasant elderly Caucasian male.  Lying down in bed.    Continues to have abdominal pain on  palpation.  Was able to tolerate regular consistency diet yesterday but no appetite today. We also discussed about the suspicious bladder wall thickening and CT scan from 3/27.  Does not report any hematuria. Chart reviewed In the last 24 hours, afebrile, hemodynamically stable, breathing on room air Last set of labs from this morning with sodium 134, potassium 3.2, LFTs including alk phos and bilirubin normalized, WBC count 16.2  Assessment and plan: Acute gallstone pancreatitis Choledocholithiasis Timeline of events as above. GI and general surgery following Pancreatitis seems to have improved.  Currently able to tolerate regular consistency diet, not on IV fluid. Currently pending repeat ERCP next week Lab trend as below.  Lipase, LFTs and bilirubin normalized Agree with general surgery diet no evidence of cholecystitis or cholangitis at this time.  Will stop antibiotics. WBC count remains mildly elevated.  Continue to monitor Continue as needed IV Zofran and IV Dilaudid Recent Labs  Lab 11/17/22 1821 11/18/22 0604 11/19/22 0905 11/20/22 0754 11/21/22 0237 11/22/22 0127 11/23/22 0846 11/24/22 0332  AST 114* 75* 37 31 25 26 23 22   ALT 207* 154* 85* 59* 47* 40 32 31  ALKPHOS 139* 110 77 63 57 58 62 58  BILITOT 1.7* 2.1* 2.4* 2.1* 1.9* 1.9* 1.7* 1.1  PROT 7.5 6.8 6.1* 6.2* 5.5* 6.0* 6.1* 5.5*  ALBUMIN 3.8 3.3* 2.6* 2.4* 2.2* 2.2* 2.1* 2.1*  INR 1.1  --   --   --   --   --   --   --   LIPASE 524* 385* 96* 39 32  --   --   --   PLT 206 194  172 183 179 210 227 275   Recent Labs  Lab 11/20/22 0754 11/21/22 0237 11/22/22 0127 11/23/22 0846 11/24/22 0332  WBC 16.6* 15.8* 17.8* 13.2* 16.2*    Bladder wall thickening 327, CT abdomen and pelvis showed focal wall thickening in the bladder adjacent to the right UVJ suspicious for neoplasm.  Radiologist recommended cystoscopy for further evaluation. Urinalysis 11/16/2022 did not show any evidence of hematuria. Patient/family have  not noticed any hematuria thereafter either. I have sent an epic message to on-call urology Dr. McDiarmid for needful inpatient versus outpatient workup.  Hypokalemia Potassium low at 3.2 this morning.  Replacement ordered Recent Labs  Lab 11/17/22 1821 11/18/22 0604 11/20/22 0754 11/21/22 0237 11/22/22 0127 11/23/22 0846 11/24/22 0332  K 5.3*   < > 4.0 3.1* 4.0 3.5 3.2*  MG 2.1  --  2.0 2.0 2.0  --   --    < > = values in this interval not displayed.   Hyponatremia Sodium level slightly trending low.  Continue to monitor. Recent Labs  Lab 11/17/22 1821 11/18/22 0604 11/19/22 0905 11/20/22 0754 11/21/22 0237 11/22/22 0127 11/23/22 0846 11/24/22 0332  NA 141 137 135 135 135 133* 134* 134*   CAD/NSTEMI/stent 2014 HLD No anginal symptoms currently. PTA on aspirin 81 mg daily.  Currently on hold Not on statin as an outpatient  paroxysmal A-fib  Was on aspirin 80 mg daily currently on hold not on anticoagulation as an outpatient   Type 2 diabetes mellitus Diet controlled.  Not on meds A1c 5.8 on 11/19/2022  HTN  GERD PPI   Extensive sigmoid diverticulosis  Chronic constipation Bowel regimen  Rash on the back. Possible associated with newly linen material. Hydrocortisone cream.  Monitor.    Mobility: Encourage ambulation  Goals of care   Code Status: Full Code     DVT prophylaxis:  enoxaparin (LOVENOX) injection 40 mg Start: 11/23/22 1000 SCDs Start: 11/17/22 1732   Antimicrobials: Stop IV antibiotics today Fluid: None currently Consultants: GI, general surgery Family Communication: Wife at bedside  Status: Inpatient Level of care:  Med-Surg   Patient from: Home Anticipated d/c to: Pending clinical course Needs to continue in-hospital care:  Pending ERCP next week       Diet:  Diet Order             Diet Heart Room service appropriate? Yes; Fluid consistency: Thin  Diet effective now                   Scheduled Meds:   enoxaparin (LOVENOX) injection  40 mg Subcutaneous Q24H   hydrocortisone cream   Topical QID   pantoprazole (PROTONIX) IV  40 mg Intravenous Q24H   polyethylene glycol  17 g Oral Daily   potassium chloride  40 mEq Oral Once   senna-docusate  1 tablet Oral QHS   sodium chloride  1 drop Both Eyes QHS    PRN meds: acetaminophen **OR** acetaminophen, bisacodyl, HYDROmorphone (DILAUDID) injection, methocarbamol (ROBAXIN) IV, ondansetron **OR** ondansetron (ZOFRAN) IV, oxyCODONE, polyvinyl alcohol, simethicone   Infusions:   methocarbamol (ROBAXIN) IV      Antimicrobials: Anti-infectives (From admission, onward)    Start     Dose/Rate Route Frequency Ordered Stop   11/18/22 0600  piperacillin-tazobactam (ZOSYN) IVPB 3.375 g  Status:  Discontinued        3.375 g 12.5 mL/hr over 240 Minutes Intravenous Every 8 hours 11/17/22 1753 11/24/22 0949   11/17/22 1845  piperacillin-tazobactam (ZOSYN) IVPB 3.375 g  3.375 g 100 mL/hr over 30 Minutes Intravenous  Once 11/17/22 1753 11/17/22 2012       Nutritional status:  Body mass index is 25.54 kg/m.          Objective: Vitals:   11/24/22 0344 11/24/22 0823  BP: 128/79 128/87  Pulse: 77 (!) 105  Resp: 18 15  Temp: 98.1 F (36.7 C)   SpO2: 94% 95%   No intake or output data in the 24 hours ending 11/24/22 1201 Filed Weights   11/17/22 1833 11/22/22 1351  Weight: 76.2 kg 76.2 kg   Weight change:  Body mass index is 25.54 kg/m.   Physical Exam: General exam: Pleasant, elderly. Skin: No rashes, lesions or ulcers. HEENT: Atraumatic, normocephalic, no obvious bleeding Lungs: Clear to auscultation bilaterally CVS: Regular rate and rhythm, no murmur GI/Abd soft, tenderness present in epigastrium, nontender elsewhere, bowel sound present CNS: Alert, awake, oriented x 3 Psychiatry: Mood appropriate Extremities: No pedal edema, no calf tenderness  Data Review: I have personally reviewed the laboratory data and studies  available.  F/u labs ordered Unresulted Labs (From admission, onward)     Start     Ordered   11/24/22 0023  Comprehensive metabolic panel  Daily,   R      11/24/22 0023   11/24/22 0023  CBC  Daily,   R      11/24/22 0023            Total time spent in review of labs and imaging, patient evaluation, formulation of plan, documentation and communication with family: 55 minutes  Signed, Lorin Glass, MD [facility] 11/24/2022

## 2022-11-25 DIAGNOSIS — K851 Biliary acute pancreatitis without necrosis or infection: Secondary | ICD-10-CM | POA: Diagnosis not present

## 2022-11-25 LAB — CBC
HCT: 38 % — ABNORMAL LOW (ref 39.0–52.0)
Hemoglobin: 12.4 g/dL — ABNORMAL LOW (ref 13.0–17.0)
MCH: 28.6 pg (ref 26.0–34.0)
MCHC: 32.6 g/dL (ref 30.0–36.0)
MCV: 87.8 fL (ref 80.0–100.0)
Platelets: 277 10*3/uL (ref 150–400)
RBC: 4.33 MIL/uL (ref 4.22–5.81)
RDW: 15.1 % (ref 11.5–15.5)
WBC: 20.4 10*3/uL — ABNORMAL HIGH (ref 4.0–10.5)
nRBC: 0 % (ref 0.0–0.2)

## 2022-11-25 LAB — COMPREHENSIVE METABOLIC PANEL
ALT: 28 U/L (ref 0–44)
AST: 20 U/L (ref 15–41)
Albumin: 2.2 g/dL — ABNORMAL LOW (ref 3.5–5.0)
Alkaline Phosphatase: 59 U/L (ref 38–126)
Anion gap: 9 (ref 5–15)
BUN: 17 mg/dL (ref 8–23)
CO2: 29 mmol/L (ref 22–32)
Calcium: 7.8 mg/dL — ABNORMAL LOW (ref 8.9–10.3)
Chloride: 99 mmol/L (ref 98–111)
Creatinine, Ser: 1.01 mg/dL (ref 0.61–1.24)
GFR, Estimated: >60 mL/min (ref >60)
Glucose, Bld: 165 mg/dL — ABNORMAL HIGH (ref 70–99)
Potassium: 4.1 mmol/L (ref 3.5–5.1)
Sodium: 137 mmol/L (ref 135–145)
Total Bilirubin: 1 mg/dL (ref 0.3–1.2)
Total Protein: 6.1 g/dL — ABNORMAL LOW (ref 6.5–8.1)

## 2022-11-25 MED ORDER — POLYETHYLENE GLYCOL 3350 17 G PO PACK
17.0000 g | PACK | Freq: Two times a day (BID) | ORAL | Status: DC
  Administered 2022-11-25 – 2022-11-27 (×4): 17 g via ORAL
  Filled 2022-11-25 (×4): qty 1

## 2022-11-25 MED ORDER — PIPERACILLIN-TAZOBACTAM 3.375 G IVPB
3.3750 g | Freq: Three times a day (TID) | INTRAVENOUS | Status: DC
  Administered 2022-11-25 – 2022-12-09 (×42): 3.375 g via INTRAVENOUS
  Filled 2022-11-25 (×43): qty 50

## 2022-11-25 MED ORDER — GUAIFENESIN ER 600 MG PO TB12
600.0000 mg | ORAL_TABLET | Freq: Two times a day (BID) | ORAL | Status: DC
  Administered 2022-11-25 – 2022-12-10 (×28): 600 mg via ORAL
  Filled 2022-11-25 (×29): qty 1

## 2022-11-25 NOTE — Care Management Important Message (Signed)
Important Message  Patient Details  Name: Jon Bray MRN: [id number] Date of Birth: [birth year]   Medicare Important Message Given:  Yes     Dorena Bodo 11/25/2022, 2:46 PM

## 2022-11-25 NOTE — Progress Notes (Addendum)
    Progress Note   Subjective  Chief Complaint: Choledocholithiasis/gallstone pancreatitis  Today, patient is surrounded by his wife and daughter and tells me that his pain is better than it was when he came in but is still fairly constant.  He is still tolerating a low-fat diet, no new symptoms overnight.  Does tell me it has been 4 days since his last bowel movement, has previously had suppositories with only gas.  Miralax now on his bedside table. Denies any new complaints or concerns.   Objective   Vital signs in last 24 hours: Temp:  [97.6 F (36.4 C)-98.5 F (36.9 C)] 98.5 F (36.9 C) (05/23 0312) Pulse Rate:  [77-95] 95 (05/23 1000) Resp:  [18] 18 (05/23 1000) BP: (105-126)/(65-80) 120/80 (05/23 1000) SpO2:  [93 %-98 %] 97 % (05/23 1000) Last BM Date : 11/23/22 General:    white male in NAD Heart:  Regular rate and rhythm; no murmurs Lungs: Respirations even and unlabored, lungs CTA bilaterally Abdomen:  Soft, moderate generalized ttp, worse in the epigastrum and mild distension. Increased BS all four quadrants Psych:  Cooperative. Normal mood and affect.  Intake/Output from previous day: 05/22 0701 - 05/23 0700 In: 240 [P.O.:240] Out: -   Lab Results: Recent Labs    11/23/22 0846 11/24/22 0332 11/25/22 0107  WBC 13.2* 16.2* 20.4*  HGB 12.1* 12.9* 12.4*  HCT 36.8* 38.9* 38.0*  PLT 227 275 277   BMET Recent Labs    11/23/22 0846 11/24/22 0332 11/25/22 0107  NA 134* 134* 137  K 3.5 3.2* 4.1  CL 99 98 99  CO2 28 29 29   GLUCOSE 154* 157* 165*  BUN 24* 21 17  CREATININE 1.00 1.04 1.01  CALCIUM 7.6* 7.3* 7.8*   LFT Recent Labs    11/25/22 0107  PROT 6.1*  ALBUMIN 2.2*  AST 20  ALT 28  ALKPHOS 59  BILITOT 1.0    Assessment / Plan:   Assessment 1.  Pancreatitis/choledocholithiasis: ERCP attempted 11/22/2022, but due to edema was unsuccessful and procedure was aborted, follow-up MRCP continue to show persistent small stone in the CBD and changes of  pancreatitis collection, pain is better controlled, liver enzymes continue to improve but white trended up overnight from 16.2-->20.4 (per hospitalist had discontinued antibiotics yesterday after surgical team said unnecessary)  Plan: 1.  Agree with MiraLAX up to 4 times a day if no bowel movement 2.  Continue low-fat 3.  Continue to trend labs 4. Plans to discuss case with advanced endoscopy colleagues with possible ERCP attempt next week.  Cholecystectomy later in hospital course 5.  Recommend restarting abx given increasing WBC  Thank you for your kind consultation, we will continue to follow.   LOS: 8 days   Unk Lightning  11/25/2022, 10:49 AM

## 2022-11-25 NOTE — Progress Notes (Signed)
PROGRESS NOTE  Jon Bray  DOB: 09-03-1940  PCP: Danella Penton, MD WUJ:[id number]  DOA: 11/17/2022  LOS: 8 days  Hospital Day: 9  Brief narrative: DU TEPLY is a 82 y.o. male with PMH significant for DM2, HTN, HLD, CAD/NSTEMI/stent 2014, paroxysmal A-fib 5/14, patient was brought to the ED at [facility] with complaint of worsening nausea, vomiting.   In March, patient saw his primary care provider for loss of appetite, weight loss. Workup showed elevated liver enzymes and bilirubin.  3/27 CT abdomen and pelvis showed focal wall thickening in the urinary bladder adjacent to right UVJ suspicious for neoplasm, also extensive sigmoid diverticulosis 3/28, MRI/MRCP showed choledocholithiasis with 2 tiny gallstones near the ampulla measuring 0.2 to 0.3 cm and mild intra and extrahepatic biliary duct dilatation, CBD dilated to 0.9 cm Patient refused to go to ED for urgent evaluation.  PCP sent referral to general surgery and GI 4/9, seen by surgeon Dr. Trisha Mangle.  Cholecystectomy was recommended but patient wanted to wait out.  5/14, patient was brought to the ED for 3 days of nausea, vomiting. Initial workup showed lipase elevated over 10,000, LFTs elevated as well MRI abdomen showed acute interstitial pancreatitis, choledocholithiasis with 4 mm stone in distal CBD. He was started on conservative management with IV fluid, IV analgesia, IV antiemetics, bowel rest Admitted to [facility] GI and general surgery were consulted. 5/20, ERCP was attempted but had to be aborted due to inability to identify the papula.  Follow-up MRCP showed persistent small stone in the CBD, changes of pancreatitis along with small fluid collection. Overall, liver enzymes continue to improve, Currently, patient is tentatively planned for ERCP by Dr. Meridee Score next week   Subjective: Patient was seen and examined this morning.  Lying on bed.  Not in distress.  Abdominal pain persist but able to tolerate regular consistency  food.  No fever.  LFTs remain stable.  However WBC count up to 20.4 today.  Family at bedside.  Patient also mentions raspiness of voice ongoing for last 6 months.   Assessment and plan: Acute gallstone pancreatitis Choledocholithiasis Timeline of events as above. GI and general surgery following Pancreatitis seems to have improved.  Currently able to tolerate regular consistency diet, not on IV fluid. Currently pending repeat ERCP next week Lab trend as below.  Lipase, LFTs and bilirubin normalized Patient initially completed 1 week course of IV Zosyn on 5/22.  Based on discussion with general surgery and GI, it was stopped.  However WBC count is high and is rising further.  Clinically does not look sick but there remains concern of infection of necrotizing pancreatitis.  Will resume IV Zosyn presumptively.   Continue as needed IV Zofran and IV Dilaudid Recent Labs  Lab 11/19/22 0905 11/20/22 0754 11/21/22 0237 11/22/22 0127 11/23/22 0846 11/24/22 0332 11/25/22 0107  AST 37 31 25 26 23 22 20   ALT 85* 59* 47* 40 32 31 28  ALKPHOS 77 63 57 58 62 58 59  BILITOT 2.4* 2.1* 1.9* 1.9* 1.7* 1.1 1.0  PROT 6.1* 6.2* 5.5* 6.0* 6.1* 5.5* 6.1*  ALBUMIN 2.6* 2.4* 2.2* 2.2* 2.1* 2.1* 2.2*  LIPASE 96* 39 32  --   --   --   --   PLT 172 183 179 210 227 275 277    Recent Labs  Lab 11/21/22 0237 11/22/22 0127 11/23/22 0846 11/24/22 0332 11/25/22 0107  WBC 15.8* 17.8* 13.2* 16.2* 20.4*     Bladder wall thickening 327, CT abdomen and  pelvis showed focal wall thickening in the bladder adjacent to the right UVJ suspicious for neoplasm.  Radiologist recommended cystoscopy for further evaluation. Urinalysis 11/16/2022 did not show any evidence of hematuria. Patient/family have not noticed any hematuria thereafter either. 5/22 I sent an epic message to on-call urology Dr. McDiarmid, recommended outpatient follow-up for further workup including cystoscopy..  Hypokalemia Potassium low at 3.2  this morning.  Replacement ordered Recent Labs  Lab 11/20/22 0754 11/21/22 0237 11/22/22 0127 11/23/22 0846 11/24/22 0332 11/25/22 0107  K 4.0 3.1* 4.0 3.5 3.2* 4.1  MG 2.0 2.0 2.0  --   --   --     Hyponatremia Sodium level slightly trending low.  Continue to monitor. Recent Labs  Lab 11/19/22 0905 11/20/22 0754 11/21/22 0237 11/22/22 0127 11/23/22 0846 11/24/22 0332 11/25/22 0107  NA 135 135 135 133* 134* 134* 137    CAD/NSTEMI/stent 2014 HLD No anginal symptoms currently. PTA on aspirin 81 mg daily.  Currently on hold Not on statin as an outpatient  paroxysmal A-fib  Was on aspirin 80 mg daily which is currently on hold. Not on anticoagulation as an outpatient   Type 2 diabetes mellitus Diet controlled.  Not on meds A1c 5.8 on 11/19/2022  HTN  GERD PPI  Cough, raspiness of voice Could be related to GERD, atelectasis. Continue PPI.  Encourage incentive spirometry.  Start Mucinex   Extensive sigmoid diverticulosis  Chronic constipation Bowel regimen  Rash on the back. Possible associated with newly linen material. Hydrocortisone cream.  Monitor.    Mobility: Encourage ambulation  Goals of care   Code Status: Full Code     DVT prophylaxis:  enoxaparin (LOVENOX) injection 40 mg Start: 11/23/22 1000 SCDs Start: 11/17/22 1732   Antimicrobials: Stop IV antibiotics today Fluid: None currently Consultants: GI, general surgery Family Communication: Wife at bedside  Status: Inpatient Level of care:  Med-Surg   Patient from: Home Anticipated d/c to: Pending clinical course Needs to continue in-hospital care:  Pending ERCP next week       Diet:  Diet Order             Diet Heart Room service appropriate? Yes; Fluid consistency: Thin  Diet effective now                   Scheduled Meds:  enoxaparin (LOVENOX) injection  40 mg Subcutaneous Q24H   hydrocortisone cream   Topical QID   pantoprazole (PROTONIX) IV  40 mg Intravenous  Q24H   polyethylene glycol  17 g Oral Daily   senna-docusate  1 tablet Oral QHS   sodium chloride  1 drop Both Eyes QHS    PRN meds: acetaminophen **OR** acetaminophen, bisacodyl, HYDROmorphone (DILAUDID) injection, methocarbamol (ROBAXIN) IV, ondansetron **OR** ondansetron (ZOFRAN) IV, oxyCODONE, polyvinyl alcohol, simethicone   Infusions:   methocarbamol (ROBAXIN) IV     piperacillin-tazobactam (ZOSYN)  IV      Antimicrobials: Anti-infectives (From admission, onward)    Start     Dose/Rate Route Frequency Ordered Stop   11/25/22 1400  piperacillin-tazobactam (ZOSYN) IVPB 3.375 g        3.375 g 12.5 mL/hr over 240 Minutes Intravenous Every 8 hours 11/25/22 1136     11/18/22 0600  piperacillin-tazobactam (ZOSYN) IVPB 3.375 g  Status:  Discontinued        3.375 g 12.5 mL/hr over 240 Minutes Intravenous Every 8 hours 11/17/22 1753 11/24/22 0949   11/17/22 1845  piperacillin-tazobactam (ZOSYN) IVPB 3.375 g  3.375 g 100 mL/hr over 30 Minutes Intravenous  Once 11/17/22 1753 11/17/22 2012       Nutritional status:  Body mass index is 25.54 kg/m.          Objective: Vitals:   11/25/22 0312 11/25/22 1000  BP: 126/68 120/80  Pulse: 77 95  Resp: 18 18  Temp: 98.5 F (36.9 C)   SpO2: 93% 97%    Intake/Output Summary (Last 24 hours) at 11/25/2022 1354 Last data filed at 11/24/2022 2100 Gross per 24 hour  Intake 240 ml  Output --  Net 240 ml   Filed Weights   11/17/22 1833 11/22/22 1351  Weight: 76.2 kg 76.2 kg   Weight change:  Body mass index is 25.54 kg/m.   Physical Exam: General exam: Pleasant, elderly. Skin: No rashes, lesions or ulcers. HEENT: Atraumatic, normocephalic, no obvious bleeding Lungs: Clear to auscultation bilaterally. CVS: Regular rate and rhythm, no murmur GI/Abd soft, tenderness present in epigastrium slightly improved and better., nontender elsewhere, bowel sound present CNS: Alert, awake, oriented x 3 Psychiatry: Mood  appropriate Extremities: No pedal edema, no calf tenderness  Data Review: I have personally reviewed the laboratory data and studies available.  F/u labs ordered Unresulted Labs (From admission, onward)     Start     Ordered   11/24/22 0023  Comprehensive metabolic panel  Daily,   R      11/24/22 0023   11/24/22 0023  CBC  Daily,   R      11/24/22 0023            Total time spent in review of labs and imaging, patient evaluation, formulation of plan, documentation and communication with family: 55 minutes  Signed, Lorin Glass, MD [facility] 11/25/2022

## 2022-11-26 ENCOUNTER — Encounter (HOSPITAL_COMMUNITY): Payer: Self-pay | Admitting: Internal Medicine

## 2022-11-26 ENCOUNTER — Inpatient Hospital Stay (HOSPITAL_COMMUNITY): Payer: HMO

## 2022-11-26 DIAGNOSIS — K851 Biliary acute pancreatitis without necrosis or infection: Secondary | ICD-10-CM | POA: Diagnosis not present

## 2022-11-26 DIAGNOSIS — K8050 Calculus of bile duct without cholangitis or cholecystitis without obstruction: Secondary | ICD-10-CM | POA: Diagnosis not present

## 2022-11-26 LAB — COMPREHENSIVE METABOLIC PANEL
ALT: 23 U/L (ref 0–44)
AST: 22 U/L (ref 15–41)
Albumin: 2.2 g/dL — ABNORMAL LOW (ref 3.5–5.0)
Alkaline Phosphatase: 61 U/L (ref 38–126)
Anion gap: 13 (ref 5–15)
BUN: 14 mg/dL (ref 8–23)
CO2: 24 mmol/L (ref 22–32)
Calcium: 7.7 mg/dL — ABNORMAL LOW (ref 8.9–10.3)
Chloride: 94 mmol/L — ABNORMAL LOW (ref 98–111)
Creatinine, Ser: 0.87 mg/dL (ref 0.61–1.24)
GFR, Estimated: >60 mL/min (ref >60)
Glucose, Bld: 170 mg/dL — ABNORMAL HIGH (ref 70–99)
Potassium: 3.5 mmol/L (ref 3.5–5.1)
Sodium: 131 mmol/L — ABNORMAL LOW (ref 135–145)
Total Bilirubin: 1.2 mg/dL (ref 0.3–1.2)
Total Protein: 5.8 g/dL — ABNORMAL LOW (ref 6.5–8.1)

## 2022-11-26 LAB — CBC
HCT: 34.4 % — ABNORMAL LOW (ref 39.0–52.0)
Hemoglobin: 11.4 g/dL — ABNORMAL LOW (ref 13.0–17.0)
MCH: 29.3 pg (ref 26.0–34.0)
MCHC: 33.1 g/dL (ref 30.0–36.0)
MCV: 88.4 fL (ref 80.0–100.0)
Platelets: 268 10*3/uL (ref 150–400)
RBC: 3.89 MIL/uL — ABNORMAL LOW (ref 4.22–5.81)
RDW: 15.2 % (ref 11.5–15.5)
WBC: 21.5 10*3/uL — ABNORMAL HIGH (ref 4.0–10.5)
nRBC: 0 % (ref 0.0–0.2)

## 2022-11-26 MED ORDER — SORBITOL 70 % SOLN
400.0000 mL | TOPICAL_OIL | Freq: Once | ORAL | Status: AC
  Administered 2022-11-27: 400 mL via RECTAL
  Filled 2022-11-26 (×2): qty 120

## 2022-11-26 NOTE — Progress Notes (Signed)
      Progress Note   Subjective  Patient feels about the same. Has some epigastric tenderness and pain that comes and goes. States no worse, same as yesterday. No fevers. Has increased Miralax and had another suppository but no BM yet.   Objective   Vital signs in last 24 hours: Temp:  [98.3 F (36.8 C)-98.8 F (37.1 C)] 98.5 F (36.9 C) (05/24 0733) Pulse Rate:  [75-95] 76 (05/24 0733) Resp:  [16-18] 18 (05/24 0733) BP: (120-148)/(73-88) 135/88 (05/24 0733) SpO2:  [95 %-97 %] 97 % (05/24 0733) Last BM Date : 11/23/22 General:    white male in NAD Abdomen:  Soft, epigastric TTP.   Neurologic:  Alert and oriented,  grossly normal neurologically. Psych:  Cooperative. Normal mood and affect.  Intake/Output from previous day: 05/23 0701 - 05/24 0700 In: 340 [P.O.:240; IV Piggyback:100] Out: -  Intake/Output this shift: No intake/output data recorded.  Lab Results: Recent Labs    11/24/22 0332 11/25/22 0107 11/26/22 0329  WBC 16.2* 20.4* 21.5*  HGB 12.9* 12.4* 11.4*  HCT 38.9* 38.0* 34.4*  PLT 275 277 268   BMET Recent Labs    11/24/22 0332 11/25/22 0107 11/26/22 0329  NA 134* 137 131*  K 3.2* 4.1 3.5  CL 98 99 94*  CO2 29 29 24   GLUCOSE 157* 165* 170*  BUN 21 17 14   CREATININE 1.04 1.01 0.87  CALCIUM 7.3* 7.8* 7.7*   LFT Recent Labs    11/26/22 0329  PROT 5.8*  ALBUMIN 2.2*  AST 22  ALT 23  ALKPHOS 61  BILITOT 1.2   PT/INR No results for input(s): "LABPROT", "INR" in the last 72 hours.  Studies/Results: No results found.     Assessment / Plan:    82 y/o male here with the following:  Choledocholithiasis Gallstone pancreatitis Constipation   ERCP attempted 5/20 to remove stone from the CBD. Unfortunately due to edema it was not successful and the procedure was aborted. Follow up MRCP done shows persistent small stone in the CBD, changes of pancreatitis noted along with small fluid collection.    We have been monitoring with  conservative measures with tentative plan to consider another ERCP attempt next week after the inflammation / edema has improved. He is tolerating a low fat diet. Pain persists but no worse and definitely better than admission comes and goes. He wants to continue his diet and states he eats when he has the appetite. WBC has risen slightly, he is on Abx. No fevers. LFTs are normal which is reassuring, I do not think he is cholangitic and leukocytosis likely from the pancreatitis. Renal function normal and reassuring. I think we may likely repeat some imaging later this weekend, can do it sooner if need be.   Otherwise we have escalated bowel regimen but still no BM. Likely narcotic induced. Discussed options, will give SMOG enema today and he will continue Miralax.   PLAN: - continue low fat diet as tolerated - trend labs - Miralax BID - SMOG enema today - consider repeat MRCP this weekend - possible ERCP attempt next week - cholecystectomy later in hospital course once CBD stone treated  Call with questions.  Harlin Rain, MD [facility]

## 2022-11-26 NOTE — Progress Notes (Signed)
PROGRESS NOTE  Jon Bray  DOB: [birth year]  PCP: Danella Penton, MD GNF:[id number]  DOA: 11/17/2022  LOS: 9 days  Hospital Day: 10  Brief narrative: Jon Bray is a 82 y.o. male with PMH significant for DM2, HTN, HLD, CAD/NSTEMI/stent 2014, paroxysmal A-fib 5/14, patient was brought to the ED at [facility] with complaint of worsening nausea, vomiting.   In March, patient saw his primary care provider for loss of appetite, weight loss. Workup showed elevated liver enzymes and bilirubin.  3/27 CT abdomen and pelvis showed focal wall thickening in the urinary bladder adjacent to right UVJ suspicious for neoplasm, also extensive sigmoid diverticulosis 3/28, MRI/MRCP showed choledocholithiasis with 2 tiny gallstones near the ampulla measuring 0.2 to 0.3 cm and mild intra and extrahepatic biliary duct dilatation, CBD dilated to 0.9 cm Patient refused to go to ED for urgent evaluation.  PCP sent referral to general surgery and GI 4/9, seen by surgeon Dr. Trisha Mangle.  Cholecystectomy was recommended but patient wanted to wait out.  5/14, patient was brought to the ED for 3 days of nausea, vomiting. Initial workup showed lipase elevated over 10,000, LFTs elevated as well MRI abdomen showed acute interstitial pancreatitis, choledocholithiasis with 4 mm stone in distal CBD. He was started on conservative management with IV fluid, IV analgesia, IV antiemetics, bowel rest Admitted to [facility] GI and general surgery were consulted. 5/20, ERCP was attempted but had to be aborted due to inability to identify the papula.  Follow-up MRCP showed persistent small stone in the CBD, changes of pancreatitis along with small fluid collection. Overall, liver enzymes continue to improve, Currently, patient is tentatively planned for ERCP by Dr. Meridee Score next week   Subjective: Patient was seen and examined this morning.  Propped up in bed.  Not in distress no new symptoms.  Abdominal tenderness persist but able to  tolerate oral intake. No discomfort up at 12.5 today.    Assessment and plan: Acute gallstone pancreatitis Choledocholithiasis Persistent leukocytosis Timeline of events as above. GI and general surgery following Pancreatitis seems to have improved.  Currently able to tolerate regular consistency diet, not on IV fluid. Currently pending repeat ERCP next week Lab trend as below.  Lipase, LFTs and bilirubin normalized However WBC continues to rise.  Continue IV Zosyn. Continue as needed IV Zofran and IV Dilaudid Recent Labs  Lab 11/20/22 0754 11/21/22 0237 11/22/22 0127 11/23/22 0846 11/24/22 0332 11/25/22 0107 11/26/22 0329  AST 31 25 26 23 22 20 22   ALT 59* 47* 40 32 31 28 23   ALKPHOS 63 57 58 62 58 59 61  BILITOT 2.1* 1.9* 1.9* 1.7* 1.1 1.0 1.2  PROT 6.2* 5.5* 6.0* 6.1* 5.5* 6.1* 5.8*  ALBUMIN 2.4* 2.2* 2.2* 2.1* 2.1* 2.2* 2.2*  LIPASE 39 32  --   --   --   --   --   PLT 183 179 210 227 275 277 268   Recent Labs  Lab 11/22/22 0127 11/23/22 0846 11/24/22 0332 11/25/22 0107 11/26/22 0329  WBC 17.8* 13.2* 16.2* 20.4* 21.5*    Bibasilar atelectasis Persistent bibasilar atelectasis noted in chest x-ray done on 5/18 and repeated today 5/24. No fever.  Mild chronic cough present. Encourage incentive spirometry.  Encourage ambulation multiple times a day Continue monitor respiratory status Mucinex for cough.  Hyponatremia Sodium level slightly trending low.  Continue to monitor. Recent Labs  Lab 11/20/22 0754 11/21/22 0237 11/22/22 0127 11/23/22 0846 11/24/22 0332 11/25/22 0107 11/26/22 0329  NA 135 135  133* 134* 134* 137 131*   CAD/NSTEMI/stent 2014 HLD No anginal symptoms currently. PTA on aspirin 81 mg daily.  Currently on hold Not on statin as an outpatient  paroxysmal A-fib  Was on aspirin 80 mg daily which is currently on hold. Not on anticoagulation as an outpatient   Type 2 diabetes mellitus Diet controlled.  Not on meds A1c 5.8 on  11/19/2022  GERD PPI  Bladder wall thickening 327, CT abdomen and pelvis showed focal wall thickening in the bladder adjacent to the right UVJ suspicious for neoplasm.  Radiologist recommended cystoscopy for further evaluation. Urinalysis 11/16/2022 did not show any evidence of hematuria. Patient/family have not noticed any hematuria thereafter either. 5/22 I sent an epic message to on-call urology Dr. McDiarmid, recommended outpatient follow-up for further workup including cystoscopy.[name]   Extensive sigmoid diverticulosis  Chronic constipation Bowel regimen  Rash on the back. Possible associated with newly linen material. Hydrocortisone cream.  Monitor.    Mobility: Encourage ambulation  Goals of care   Code Status: Full Code     DVT prophylaxis:  enoxaparin (LOVENOX) injection 40 mg Start: 11/23/22 1000 SCDs Start: 11/17/22 1732   Antimicrobials: IV Zosyn Fluid: None currently Consultants: GI, general surgery Family Communication: Family not at bedside today  Status: Inpatient Level of care:  Med-Surg   Patient from: Home Anticipated d/c to: Pending clinical course Needs to continue in-hospital care:  Pending ERCP next week   Diet:  Diet Order             Diet Heart Room service appropriate? Yes; Fluid consistency: Thin  Diet effective now                   Scheduled Meds:  enoxaparin (LOVENOX) injection  40 mg Subcutaneous Q24H   guaiFENesin  600 mg Oral BID   hydrocortisone cream   Topical QID   pantoprazole (PROTONIX) IV  40 mg Intravenous Q24H   polyethylene glycol  17 g Oral BID   senna-docusate  1 tablet Oral QHS   sodium chloride  1 drop Both Eyes QHS   sorbitol, milk of mag, mineral oil, glycerin (SMOG) enema  400 mL Rectal Once    PRN meds: acetaminophen **OR** acetaminophen, bisacodyl, HYDROmorphone (DILAUDID) injection, methocarbamol (ROBAXIN) IV, ondansetron **OR** ondansetron (ZOFRAN) IV, oxyCODONE, polyvinyl alcohol, simethicone    Infusions:   methocarbamol (ROBAXIN) IV     piperacillin-tazobactam (ZOSYN)  IV 3.375 g (11/26/22 0650)    Antimicrobials: Anti-infectives (From admission, onward)    Start     Dose/Rate Route Frequency Ordered Stop   11/25/22 1400  piperacillin-tazobactam (ZOSYN) IVPB 3.375 g        3.375 g 12.5 mL/hr over 240 Minutes Intravenous Every 8 hours 11/25/22 1136     11/18/22 0600  piperacillin-tazobactam (ZOSYN) IVPB 3.375 g  Status:  Discontinued        3.375 g 12.5 mL/hr over 240 Minutes Intravenous Every 8 hours 11/17/22 1753 11/24/22 0949   11/17/22 1845  piperacillin-tazobactam (ZOSYN) IVPB 3.375 g        3.375 g 100 mL/hr over 30 Minutes Intravenous  Once 11/17/22 1753 11/17/22 2012       Nutritional status:  Body mass index is 25.54 kg/m.          Objective: Vitals:   11/26/22 0645 11/26/22 0733  BP: (!) 148/76 135/88  Pulse: 75 76  Resp: 18 18  Temp: 98.7 F (37.1 C) 98.5 F (36.9 C)  SpO2: 97% 97%  Intake/Output Summary (Last 24 hours) at 11/26/2022 1519 Last data filed at 11/25/2022 2115 Gross per 24 hour  Intake 340 ml  Output --  Net 340 ml   Filed Weights   11/17/22 1833 11/22/22 1351  Weight: 76.2 kg 76.2 kg   Weight change:  Body mass index is 25.54 kg/m.   Physical Exam: General exam: Pleasant, elderly.  Not in distress Skin: No rashes, lesions or ulcers. HEENT: Atraumatic, normocephalic, no obvious bleeding Lungs: Diminished air entry in both bases.  Otherwise clear to auscultation bilaterally. CVS: Regular rate and rhythm, no murmur GI/Abd soft, tenderness present in epigastrium slightly improved and better., nontender elsewhere, bowel sound present CNS: Alert, awake, oriented x 3 Psychiatry: Mood appropriate Extremities: No pedal edema, no calf tenderness  Data Review: I have personally reviewed the laboratory data and studies available.  F/u labs ordered Unresulted Labs (From admission, onward)     Start     Ordered    11/27/22 0500  CBC with Differential/Platelet  Daily,   R     Question:  Specimen collection method  Answer:  Lab=Lab collect   11/26/22 0808   11/27/22 0500  Comprehensive metabolic panel  Daily,   R     Question:  Specimen collection method  Answer:  Lab=Lab collect   11/26/22 0808            Total time spent in review of labs and imaging, patient evaluation, formulation of plan, documentation and communication with family: 45 minutes  Signed, Lorin Glass, MD [facility] 11/26/2022

## 2022-11-27 DIAGNOSIS — K851 Biliary acute pancreatitis without necrosis or infection: Secondary | ICD-10-CM | POA: Diagnosis not present

## 2022-11-27 DIAGNOSIS — K8050 Calculus of bile duct without cholangitis or cholecystitis without obstruction: Secondary | ICD-10-CM | POA: Diagnosis not present

## 2022-11-27 LAB — CBC WITH DIFFERENTIAL/PLATELET
Abs Immature Granulocytes: 0.16 10*3/uL — ABNORMAL HIGH (ref 0.00–0.07)
Basophils Absolute: 0.1 10*3/uL (ref 0.0–0.1)
Basophils Relative: 0 %
Eosinophils Absolute: 0.2 10*3/uL (ref 0.0–0.5)
Eosinophils Relative: 1 %
HCT: 34.5 % — ABNORMAL LOW (ref 39.0–52.0)
Hemoglobin: 11.5 g/dL — ABNORMAL LOW (ref 13.0–17.0)
Immature Granulocytes: 1 %
Lymphocytes Relative: 17 %
Lymphs Abs: 3.1 10*3/uL (ref 0.7–4.0)
MCH: 29.6 pg (ref 26.0–34.0)
MCHC: 33.3 g/dL (ref 30.0–36.0)
MCV: 88.7 fL (ref 80.0–100.0)
Monocytes Absolute: 1.2 10*3/uL — ABNORMAL HIGH (ref 0.1–1.0)
Monocytes Relative: 6 %
Neutro Abs: 14.3 10*3/uL — ABNORMAL HIGH (ref 1.7–7.7)
Neutrophils Relative %: 75 %
Platelets: 297 10*3/uL (ref 150–400)
RBC: 3.89 MIL/uL — ABNORMAL LOW (ref 4.22–5.81)
RDW: 14.9 % (ref 11.5–15.5)
WBC: 19 10*3/uL — ABNORMAL HIGH (ref 4.0–10.5)
nRBC: 0 % (ref 0.0–0.2)

## 2022-11-27 LAB — COMPREHENSIVE METABOLIC PANEL
ALT: 21 U/L (ref 0–44)
AST: 21 U/L (ref 15–41)
Albumin: 2 g/dL — ABNORMAL LOW (ref 3.5–5.0)
Alkaline Phosphatase: 56 U/L (ref 38–126)
Anion gap: 9 (ref 5–15)
BUN: 10 mg/dL (ref 8–23)
CO2: 29 mmol/L (ref 22–32)
Calcium: 7.9 mg/dL — ABNORMAL LOW (ref 8.9–10.3)
Chloride: 97 mmol/L — ABNORMAL LOW (ref 98–111)
Creatinine, Ser: 0.93 mg/dL (ref 0.61–1.24)
GFR, Estimated: >60 mL/min (ref >60)
Glucose, Bld: 164 mg/dL — ABNORMAL HIGH (ref 70–99)
Potassium: 3.9 mmol/L (ref 3.5–5.1)
Sodium: 135 mmol/L (ref 135–145)
Total Bilirubin: 1.3 mg/dL — ABNORMAL HIGH (ref 0.3–1.2)
Total Protein: 5.6 g/dL — ABNORMAL LOW (ref 6.5–8.1)

## 2022-11-27 MED ORDER — POLYETHYLENE GLYCOL 3350 17 G PO PACK
17.0000 g | PACK | Freq: Three times a day (TID) | ORAL | Status: DC
  Administered 2022-11-27 – 2022-11-30 (×8): 17 g via ORAL
  Filled 2022-11-27 (×7): qty 1

## 2022-11-27 NOTE — Progress Notes (Signed)
Progress Note   Subjective  Chief Complaint: Choledocholithiasis/gallstone pancreatitis/constipation  Today, patient tells me he feels about the same, he did have a smog enema early this morning and only produced "2 little turds".  This is not any better than what he gets with his normal enemas, it did not really relieve any of the pressure or distention, remains uncomfortable all over his abdomen and only tolerating about 8 ounces of food per him per day, no increase or worsening of symptoms overnight.   Objective   Vital signs in last 24 hours: Temp:  [98.2 F (36.8 C)-98.7 F (37.1 C)] 98.2 F (36.8 C) (05/25 0820) Pulse Rate:  [68-75] 72 (05/25 0820) Resp:  [17-18] 17 (05/25 0820) BP: (117-135)/(57-112) 135/71 (05/25 0820) SpO2:  [96 %-98 %] 98 % (05/25 0820) Last BM Date : 11/27/22 General:    white male in NAD Heart:  Regular rate and rhythm; no murmurs Lungs: Respirations even and unlabored, lungs CTA bilaterally Abdomen:  Soft, marked epigastric ttp, mild generalized ttp, moderate distension, decreased BS all four quadrants Psych:  Cooperative. Normal mood and affect.  Intake/Output from previous day: 05/24 0701 - 05/25 0700 In: 477 [P.O.:477] Out: -   Lab Results: Recent Labs    11/25/22 0107 11/26/22 0329 11/27/22 0426  WBC 20.4* 21.5* 19.0*  HGB 12.4* 11.4* 11.5*  HCT 38.0* 34.4* 34.5*  PLT 277 268 297   BMET Recent Labs    11/25/22 0107 11/26/22 0329 11/27/22 0426  NA 137 131* 135  K 4.1 3.5 3.9  CL 99 94* 97*  CO2 29 24 29   GLUCOSE 165* 170* 164*  BUN 17 14 10   CREATININE 1.01 0.87 0.93  CALCIUM 7.8* 7.7* 7.9*   LFT Recent Labs    11/27/22 0426  PROT 5.6*  ALBUMIN 2.0*  AST 21  ALT 21  ALKPHOS 56  BILITOT 1.3*   Studies/Results: DG Chest 2 View  Result Date: 11/26/2022 CLINICAL DATA:  Shortness of breath. EXAM: CHEST - 2 VIEW COMPARISON:  11/21/2022 FINDINGS: Bibasilar chest densities with slightly improved aeration at the left  lung base. Basilar chest densities are best appreciated on the lateral view. Upper lungs remain clear. Heart size is stable. Atherosclerotic calcifications at the aortic arch. IMPRESSION: 1. Bibasilar chest densities, left side greater than right. Findings could be associated with atelectasis/infection. 2. Slightly improved aeration at the left lung base compared to 11/21/2022. Electronically Signed   By: Richarda Overlie M.D.   On: 11/26/2022 14:46     Assessment / Plan:   Assessment: 1.  Choledocholithiasis/gallstone pancreatitis: ERCP attempted 5/20 to remove stone from CBD but unfortunately due to edema it was not successful and the procedure was aborted, follow-up MRCP showed persistent small stone in the CBD and changes of pancreatitis along with a small fluid collection, patient has been monitored with conservative measures and tentative plan to consider another ERCP next week after her inflammation/edema has improved, white count thankfully improving today after restarting antibiotics 2.  Constipation  Plan: 1.  Continue MiraLAX twice daily 2.  Continue low-fat diet as tolerated 3.  Continue analgesics and antiemetics 4.  Continue to trend labs 5.  At this point with minimal increase in total bili overnight and no real change in symptoms, will defer MRCP, could consider over the weekend if anything changes 6.  Again possible ERCP attempt next week 7.  Cholecystectomy later in hospital course once CBD stone treated  Thank you for your kind consultation, we will  continue to follow.   LOS: 10 days   Unk Lightning  11/27/2022, 12:17 PM

## 2022-11-27 NOTE — Progress Notes (Signed)
PROGRESS NOTE    Jon Bray  ZHY:[id number] DOB: [birth year] DOA: 11/17/2022 PCP: Danella Penton, MD   Brief Narrative:  Jon Bray is a 82 y.o. male with PMH significant for DM2, HTN, HLD, CAD/NSTEMI/stent 2014, paroxysmal A-fib 5/14, patient was brought to the ED at [facility] with complaint of worsening nausea, vomiting.   In March, patient saw his primary care provider for loss of appetite, weight loss. Workup showed elevated liver enzymes and bilirubin.  3/27 CT abdomen and pelvis showed focal wall thickening in the urinary bladder adjacent to right UVJ suspicious for neoplasm, also extensive sigmoid diverticulosis 3/28, MRI/MRCP showed choledocholithiasis with 2 tiny gallstones near the ampulla measuring 0.2 to 0.3 cm and mild intra and extrahepatic biliary duct dilatation, CBD dilated to 0.9 cm Patient refused to go to ED for urgent evaluation.  PCP sent referral to general surgery and GI 4/9, seen by surgeon Dr. Trisha Mangle.  Cholecystectomy was recommended but patient wanted to wait out.   5/14, patient was brought to the ED for 3 days of nausea, vomiting. Initial workup showed lipase elevated over 10,000, LFTs elevated as well MRI abdomen showed acute interstitial pancreatitis, choledocholithiasis with 4 mm stone in distal CBD. He was started on conservative management with IV fluid, IV analgesia, IV antiemetics, bowel rest Admitted to [facility] GI and general surgery were consulted. 5/20, ERCP was attempted but had to be aborted due to inability to identify the papula.  Follow-up MRCP showed persistent small stone in the CBD, changes of pancreatitis along with small fluid collection. Overall, liver enzymes continue to improve, Currently, patient is tentatively planned for ERCP by Dr. Meridee Score next week.  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 GI and general surgery following Pancreatitis seems to have improved.  Currently able to tolerate regular consistency diet, LFTs normalized and bilirubin fairly stable.  Need to be in the hospital for ERCP planned next week with Dr. Meridee Score.  WBC rising but he is afebrile.  Continue IV Zosyn.  Bibasilar atelectasis Persistent bibasilar atelectasis noted in chest x-ray done on 5/18 and repeated 5/24.  But he is asymptomatic and not hypoxic.  Incentive spirometry encouraged.  Hyponatremia: Resolved.  CAD/NSTEMI/stent 2014 HLD No anginal symptoms currently. PTA on aspirin 81 mg daily.  Currently on hold Not on statin as an outpatient   paroxysmal A-fib  Was on aspirin daily which is currently on hold. Not on anticoagulation as an outpatient   Type 2 diabetes mellitus Diet controlled.  Not on meds A1c 5.8 on 11/19/2022   GERD PPI   Bladder wall thickening 327, CT abdomen and pelvis showed focal wall thickening in the bladder adjacent to the right UVJ suspicious for neoplasm.  Radiologist recommended cystoscopy for further evaluation. Urinalysis 11/16/2022 did not show any evidence of hematuria. Patient/family have not noticed any hematuria thereafter either. 5/22 I sent an epic message to on-call urology Dr. McDiarmid, recommended outpatient follow-up for further workup including cystoscopy.[name]     Extensive sigmoid diverticulosis  Chronic constipation Bowel regimen   Rash on the back. Possible associated with newly linen material. On Hydrocortisone cream.   DVT prophylaxis: enoxaparin (LOVENOX) injection 40 mg Start: 11/23/22 1000 SCDs Start: 11/17/22 1732  Code Status: Full Code  Family Communication:  None present at bedside.  Plan of care discussed with patient in length and  he/she verbalized understanding and agreed with it.  Status is: Inpatient Remains inpatient appropriate because: Scheduled for ERCP next week   Estimated body mass index is 25.54 kg/m as calculated from the following:   Height as of this encounter: 5\' 8"  (1.727 m).   Weight as of this encounter: 76.2 kg.    Nutritional Assessment: Body mass index is 25.54 kg/m.[name] Seen by dietician.  I agree with the assessment and plan as outlined below: Nutrition Status:        . Skin Assessment: I have examined the patient's skin and I agree with the wound assessment as performed by the wound care RN as outlined below:    Consultants:  GI and general surgery  Procedures:  As above  Antimicrobials:  Anti-infectives (From admission, onward)    Start     Dose/Rate Route Frequency Ordered Stop   11/25/22 1400  piperacillin-tazobactam (ZOSYN) IVPB 3.375 g        3.375 g 12.5 mL/hr over 240 Minutes Intravenous Every 8 hours 11/25/22 1136     11/18/22 0600  piperacillin-tazobactam (ZOSYN) IVPB 3.375 g  Status:  Discontinued        3.375 g 12.5 mL/hr over 240 Minutes Intravenous Every 8 hours 11/17/22 1753 11/24/22 0949   11/17/22 1845  piperacillin-tazobactam (ZOSYN) IVPB 3.375 g        3.375 g 100 mL/hr over 30 Minutes Intravenous  Once 11/17/22 1753 11/17/22 2012         Subjective: Seen and examined.  Complains of abdominal pain which is stable.  Wife at the bedside.  He has no other complaint.  Objective: Vitals:   11/26/22 1700 11/26/22 2118 11/27/22 0622 11/27/22 0820  BP: (!) 117/57 (!) 127/112 129/69 135/71  Pulse: 75 75 68 72  Resp: 18  17 17   Temp: 98.7 F (37.1 C) 98.2 F (36.8 C) 98.2 F (36.8 C) 98.2 F (36.8 C)  TempSrc: Oral Oral Oral Oral  SpO2: 96% 97% 97% 98%  Weight:      Height:        Intake/Output Summary (Last 24 hours) at 11/27/2022 1159 Last data filed at 11/26/2022 2118 Gross per 24 hour  Intake 237 ml  Output --  Net 237 ml   Filed  Weights   11/17/22 1833 11/22/22 1351  Weight: 76.2 kg 76.2 kg    Examination:  General exam: Appears calm and comfortable  Respiratory system: Clear to auscultation. Respiratory effort normal. Cardiovascular system: S1 & S2 heard, RRR. No JVD, murmurs, rubs, gallops or clicks. No pedal edema. Gastrointestinal system: Abdomen is nondistended, soft and moderate to severe periumbilical tenderness. No organomegaly or masses felt. Normal bowel sounds heard. Central nervous system: Alert and oriented. No focal neurological deficits. Extremities: Symmetric 5 x 5 power. Skin: No rashes, lesions or ulcers Psychiatry: Judgement and insight appear normal. Mood & affect appropriate.    Data Reviewed: I have personally reviewed following labs and imaging studies  CBC: Recent Labs  Lab 11/21/22 0237 11/22/22 0127 11/23/22 0846 11/24/22 0332 11/25/22 0107 11/26/22 0329 11/27/22 0426  WBC 15.8* 17.8* 13.2* 16.2* 20.4* 21.5* 19.0*  NEUTROABS 11.5* 12.1*  --   --   --   --  14.3*  HGB 11.9* 12.9* 12.1* 12.9* 12.4* 11.4* 11.5*  HCT 36.4* 40.1 36.8* 38.9* 38.0* 34.4* 34.5*  MCV 88.6 89.5 87.6 86.3 87.8  88.4 88.7  PLT 179 210 227 275 277 268 297   Basic Metabolic Panel: Recent Labs  Lab 11/21/22 0237 11/22/22 0127 11/23/22 0846 11/24/22 0332 11/25/22 0107 11/26/22 0329 11/27/22 0426  NA 135 133* 134* 134* 137 131* 135  K 3.1* 4.0 3.5 3.2* 4.1 3.5 3.9  CL 102 99 99 98 99 94* 97*  CO2 23 25 28 29 29 24 29   GLUCOSE 91 109* 154* 157* 165* 170* 164*  BUN 15 14 24* 21 17 14 10   CREATININE 0.71 0.88 1.00 1.04 1.01 0.87 0.93  CALCIUM 7.4* 7.7* 7.6* 7.3* 7.8* 7.7* 7.9*  MG 2.0 2.0  --   --   --   --   --    GFR: Estimated Creatinine Clearance: 58.2 mL/min (by C-G formula based on SCr of 0.93 mg/dL). Liver Function Tests: Recent Labs  Lab 11/23/22 0846 11/24/22 0332 11/25/22 0107 11/26/22 0329 11/27/22 0426  AST 23 22 20 22 21   ALT 32 31 28 23 21   ALKPHOS 62 58 59 61 56   BILITOT 1.7* 1.1 1.0 1.2 1.3*  PROT 6.1* 5.5* 6.1* 5.8* 5.6*  ALBUMIN 2.1* 2.1* 2.2* 2.2* 2.0*   Recent Labs  Lab 11/21/22 0237  LIPASE 32   No results for input(s): "AMMONIA" in the last 168 hours. Coagulation Profile: No results for input(s): "INR", "PROTIME" in the last 168 hours. Cardiac Enzymes: No results for input(s): "CKTOTAL", "CKMB", "CKMBINDEX", "TROPONINI" in the last 168 hours. BNP (last 3 results) No results for input(s): "PROBNP" in the last 8760 hours. HbA1C: No results for input(s): "HGBA1C" in the last 72 hours. CBG: No results for input(s): "GLUCAP" in the last 168 hours. Lipid Profile: No results for input(s): "CHOL", "HDL", "LDLCALC", "TRIG", "CHOLHDL", "LDLDIRECT" in the last 72 hours. Thyroid Function Tests: No results for input(s): "TSH", "T4TOTAL", "FREET4", "T3FREE", "THYROIDAB" in the last 72 hours. Anemia Panel: No results for input(s): "VITAMINB12", "FOLATE", "FERRITIN", "TIBC", "IRON", "RETICCTPCT" in the last 72 hours. Sepsis Labs: No results for input(s): "PROCALCITON", "LATICACIDVEN" in the last 168 hours.  No results found for this or any previous visit (from the past 240 hour(s)).   Radiology Studies: DG Chest 2 View  Result Date: 11/26/2022 CLINICAL DATA:  Shortness of breath. EXAM: CHEST - 2 VIEW COMPARISON:  11/21/2022 FINDINGS: Bibasilar chest densities with slightly improved aeration at the left lung base. Basilar chest densities are best appreciated on the lateral view. Upper lungs remain clear. Heart size is stable. Atherosclerotic calcifications at the aortic arch. IMPRESSION: 1. Bibasilar chest densities, left side greater than right. Findings could be associated with atelectasis/infection. 2. Slightly improved aeration at the left lung base compared to 11/21/2022. Electronically Signed   By: Richarda Overlie M.D.   On: 11/26/2022 14:46    Scheduled Meds:  enoxaparin (LOVENOX) injection  40 mg Subcutaneous Q24H   guaiFENesin  600 mg Oral  BID   hydrocortisone cream   Topical QID   pantoprazole (PROTONIX) IV  40 mg Intravenous Q24H   polyethylene glycol  17 g Oral BID   senna-docusate  1 tablet Oral QHS   sodium chloride  1 drop Both Eyes QHS   Continuous Infusions:  methocarbamol (ROBAXIN) IV     piperacillin-tazobactam (ZOSYN)  IV 3.375 g (11/27/22 0911)     LOS: 10 days   Hughie Closs, MD [facility]  11/27/2022, 11:59 AM   *Please note that this is a verbal dictation therefore any spelling or grammatical errors are due to the "[employer]" system interpretation.  Please page via Amion and do not message via secure chat for urgent patient care matters. Secure chat can be used for non urgent patient care matters.  How to contact the [facility] Attending or Consulting provider 7A - 7P or covering provider during after hours 7P -7A, for this patient?  Check the care team in [facility] and look for a) attending/consulting [facility] provider listed and b) the [facility] team listed. Page or secure chat 7A-7P. Log into [email or web address] and use [place]'s universal password to access. If you do not have the password, please contact the hospital operator. Locate the [facility] provider you are looking for under [facility] and page to a number that you can be directly reached. If you still have difficulty reaching the provider, please page the [facility] (Director on Call) for the Hospitalists listed on amion for assistance.

## 2022-11-28 DIAGNOSIS — K851 Biliary acute pancreatitis without necrosis or infection: Secondary | ICD-10-CM | POA: Diagnosis not present

## 2022-11-28 DIAGNOSIS — K8050 Calculus of bile duct without cholangitis or cholecystitis without obstruction: Secondary | ICD-10-CM | POA: Diagnosis not present

## 2022-11-28 LAB — COMPREHENSIVE METABOLIC PANEL
ALT: 20 U/L (ref 0–44)
AST: 20 U/L (ref 15–41)
Albumin: 2.1 g/dL — ABNORMAL LOW (ref 3.5–5.0)
Alkaline Phosphatase: 63 U/L (ref 38–126)
Anion gap: 9 (ref 5–15)
BUN: 9 mg/dL (ref 8–23)
CO2: 26 mmol/L (ref 22–32)
Calcium: 7.6 mg/dL — ABNORMAL LOW (ref 8.9–10.3)
Chloride: 97 mmol/L — ABNORMAL LOW (ref 98–111)
Creatinine, Ser: 0.79 mg/dL (ref 0.61–1.24)
GFR, Estimated: >60 mL/min (ref >60)
Glucose, Bld: 170 mg/dL — ABNORMAL HIGH (ref 70–99)
Potassium: 3.7 mmol/L (ref 3.5–5.1)
Sodium: 132 mmol/L — ABNORMAL LOW (ref 135–145)
Total Bilirubin: 1.3 mg/dL — ABNORMAL HIGH (ref 0.3–1.2)
Total Protein: 6.2 g/dL — ABNORMAL LOW (ref 6.5–8.1)

## 2022-11-28 LAB — CBC WITH DIFFERENTIAL/PLATELET
Abs Immature Granulocytes: 0.12 10*3/uL — ABNORMAL HIGH (ref 0.00–0.07)
Basophils Absolute: 0.1 10*3/uL (ref 0.0–0.1)
Basophils Relative: 0 %
Eosinophils Absolute: 0.2 10*3/uL (ref 0.0–0.5)
Eosinophils Relative: 1 %
HCT: 34.1 % — ABNORMAL LOW (ref 39.0–52.0)
Hemoglobin: 11.2 g/dL — ABNORMAL LOW (ref 13.0–17.0)
Immature Granulocytes: 1 %
Lymphocytes Relative: 17 %
Lymphs Abs: 3 10*3/uL (ref 0.7–4.0)
MCH: 28.9 pg (ref 26.0–34.0)
MCHC: 32.8 g/dL (ref 30.0–36.0)
MCV: 87.9 fL (ref 80.0–100.0)
Monocytes Absolute: 1.3 10*3/uL — ABNORMAL HIGH (ref 0.1–1.0)
Monocytes Relative: 8 %
Neutro Abs: 12.7 10*3/uL — ABNORMAL HIGH (ref 1.7–7.7)
Neutrophils Relative %: 73 %
Platelets: 307 10*3/uL (ref 150–400)
RBC: 3.88 MIL/uL — ABNORMAL LOW (ref 4.22–5.81)
RDW: 14.6 % (ref 11.5–15.5)
WBC: 17.4 10*3/uL — ABNORMAL HIGH (ref 4.0–10.5)
nRBC: 0 % (ref 0.0–0.2)

## 2022-11-28 MED ORDER — ENSURE ENLIVE PO LIQD
237.0000 mL | Freq: Three times a day (TID) | ORAL | Status: DC
  Administered 2022-11-28 (×3): 237 mL via ORAL
  Administered 2022-11-29: 140 mL via ORAL
  Administered 2022-11-30: 237 mL via ORAL

## 2022-11-28 NOTE — Progress Notes (Signed)
PROGRESS NOTE    Jon Bray  ZOX:[id number] DOB: 05/20/1941 DOA: 11/17/2022 PCP: Danella Penton, MD   Brief Narrative:  Jon Bray is a 82 y.o. male with PMH significant for DM2, HTN, HLD, CAD/NSTEMI/stent 2014, paroxysmal A-fib 5/14, patient was brought to the ED at [facility] with complaint of worsening nausea, vomiting.   In March, patient saw his primary care provider for loss of appetite, weight loss. Workup showed elevated liver enzymes and bilirubin.  3/27 CT abdomen and pelvis showed focal wall thickening in the urinary bladder adjacent to right UVJ suspicious for neoplasm, also extensive sigmoid diverticulosis 3/28, MRI/MRCP showed choledocholithiasis with 2 tiny gallstones near the ampulla measuring 0.2 to 0.3 cm and mild intra and extrahepatic biliary duct dilatation, CBD dilated to 0.9 cm Patient refused to go to ED for urgent evaluation.  PCP sent referral to general surgery and GI 4/9, seen by surgeon Dr. Trisha Mangle.  Cholecystectomy was recommended but patient wanted to wait out.   5/14, patient was brought to the ED for 3 days of nausea, vomiting. Initial workup showed lipase elevated over 10,000, LFTs elevated as well MRI abdomen showed acute interstitial pancreatitis, choledocholithiasis with 4 mm stone in distal CBD. He was started on conservative management with IV fluid, IV analgesia, IV antiemetics, bowel rest Admitted to [facility] GI and general surgery were consulted. 5/20, ERCP was attempted but had to be aborted due to inability to identify the papula.  Follow-up MRCP showed persistent small stone in the CBD, changes of pancreatitis along with small fluid collection. Overall, liver enzymes continue to improve, Currently, patient is tentatively planned for ERCP by Dr. Meridee Score next week.  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 GI and general surgery following Pancreatitis seems to have improved.  Currently able to tolerate regular consistency diet however wife and daughter concerned that patient is not eating much.  We discussed about TPN in length but patient still appears to be strong and is eating, family would prefer to avoid TPN if at all possible, after mutual agreement, we decided to start him on boost 3 times a day. LFTs normalized and bilirubin fairly stable.  Need to be in the hospital for ERCP planned next week with Dr. Meridee Score.  WBC improved somewhat today.  Continue IV Zosyn.  Bibasilar atelectasis Persistent bibasilar atelectasis noted in chest x-ray done on 5/18 and repeated 5/24.  But he is asymptomatic and not hypoxic.  Incentive spirometry encouraged.  Hyponatremia: Resolved.  CAD/NSTEMI/stent 2014 HLD No anginal symptoms currently. PTA on aspirin 81 mg daily.  Currently on hold Not on statin as an outpatient   paroxysmal A-fib  Was on aspirin daily which is currently on hold. Not on anticoagulation as an outpatient   Type 2 diabetes mellitus Diet controlled.  Not on meds A1c 5.8 on 11/19/2022   GERD PPI   Bladder wall thickening 327, CT abdomen and pelvis showed focal wall thickening in the bladder adjacent to the right UVJ suspicious for neoplasm.  Radiologist recommended cystoscopy for further evaluation. Urinalysis 11/16/2022 did not show any evidence of hematuria. Patient/family have not noticed any hematuria thereafter either. 5/22 I sent an epic message to on-call urology Dr. McDiarmid, recommended  outpatient follow-up for further workup including cystoscopy.Jon Bray     Extensive sigmoid diverticulosis  Chronic constipation Bowel regimen   Rash on the back. Patient still complains of  itching.  Rash appears to be improving.  I offered Benadryl but he does not want to take that as that makes him sleepy and he wants to stay alert and walk around.  Continue hydrocortisone cream.  DVT prophylaxis: enoxaparin (LOVENOX) injection 40 mg Start: 11/23/22 1000 SCDs Start: 11/17/22 1732   Code Status: Full Code  Family Communication: Daughter and wife present at bedside.  Plan of care discussed with patient in length and he/she verbalized understanding and agreed with it.  Status is: Inpatient Remains inpatient appropriate because: Scheduled for ERCP next week   Estimated body mass index is 25.54 kg/m as calculated from the following:   Height as of this encounter: 5\' 8"  (1.727 m).   Weight as of this encounter: 76.2 kg.    Nutritional Assessment: Body mass index is 25.54 kg/m.Jon Bray Seen by dietician.  I agree with the assessment and plan as outlined below: Nutrition Status:        . Skin Assessment: I have examined the patient's skin and I agree with the wound assessment as performed by the wound care RN as outlined below:    Consultants:  GI and general surgery  Procedures:  As above  Antimicrobials:  Anti-infectives (From admission, onward)    Start     Dose/Rate Route Frequency Ordered Stop   11/25/22 1400  piperacillin-tazobactam (ZOSYN) IVPB 3.375 g        3.375 g 12.5 mL/hr over 240 Minutes Intravenous Every 8 hours 11/25/22 1136     11/18/22 0600  piperacillin-tazobactam (ZOSYN) IVPB 3.375 g  Status:  Discontinued        3.375 g 12.5 mL/hr over 240 Minutes Intravenous Every 8 hours 11/17/22 1753 11/24/22 0949   11/17/22 1845  piperacillin-tazobactam (ZOSYN) IVPB 3.375 g        3.375 g 100 mL/hr over 30 Minutes Intravenous  Once 11/17/22 1753 11/17/22 2012         Subjective: Patient seen and examined.  Complains of itching in the back and poor appetite.  Daughter and wife at the bedside.  Objective: Vitals:   11/27/22 1647 11/27/22 2100  11/28/22 0541 11/28/22 0837  BP: 127/65 (!) 142/69 126/72 111/61  Pulse: 74 73 76 71  Resp: 18 18 18 16   Temp: 98 F (36.7 C) 98.4 F (36.9 C) 98.7 F (37.1 C) 98 F (36.7 C)  TempSrc: Oral Oral Oral Oral  SpO2: 99% 97% 95% 95%  Weight:      Height:        Intake/Output Summary (Last 24 hours) at 11/28/2022 1137 Last data filed at 11/27/2022 2100 Gross per 24 hour  Intake 240 ml  Output --  Net 240 ml    Filed Weights   11/17/22 1833 11/22/22 1351  Weight: 76.2 kg 76.2 kg    Examination:  General exam: Appears calm and comfortable  Respiratory system: Clear to auscultation. Respiratory effort normal. Cardiovascular system: S1 & S2 heard, RRR. No JVD, murmurs, rubs, gallops or clicks. No pedal edema. Gastrointestinal system: Abdomen is nondistended, soft and mild periumbilical tenderness. No organomegaly or masses felt. Normal bowel sounds heard. Central nervous system: Alert and oriented. No focal neurological deficits. Extremities: Symmetric 5 x 5 power. Skin: No rashes, lesions or ulcers.  Psychiatry: Judgement and insight appear normal. Mood & affect appropriate.   Data  Reviewed: I have personally reviewed following labs and imaging studies  CBC: Recent Labs  Lab 11/22/22 0127 11/23/22 0846 11/24/22 0332 11/25/22 0107 11/26/22 0329 11/27/22 0426 11/28/22 0242  WBC 17.8*   < > 16.2* 20.4* 21.5* 19.0* 17.4*  NEUTROABS 12.1*  --   --   --   --  14.3* 12.7*  HGB 12.9*   < > 12.9* 12.4* 11.4* 11.5* 11.2*  HCT 40.1   < > 38.9* 38.0* 34.4* 34.5* 34.1*  MCV 89.5   < > 86.3 87.8 88.4 88.7 87.9  PLT 210   < > 275 277 268 297 307   < > = values in this interval not displayed.    Basic Metabolic Panel: Recent Labs  Lab 11/22/22 0127 11/23/22 0846 11/24/22 0332 11/25/22 0107 11/26/22 0329 11/27/22 0426 11/28/22 0242  NA 133*   < > 134* 137 131* 135 132*  K 4.0   < > 3.2* 4.1 3.5 3.9 3.7  CL 99   < > 98 99 94* 97* 97*  CO2 25   < > 29 29 24 29 26   GLUCOSE  109*   < > 157* 165* 170* 164* 170*  BUN 14   < > 21 17 14 10 9   CREATININE 0.88   < > 1.04 1.01 0.87 0.93 0.79  CALCIUM 7.7*   < > 7.3* 7.8* 7.7* 7.9* 7.6*  MG 2.0  --   --   --   --   --   --    < > = values in this interval not displayed.    GFR: Estimated Creatinine Clearance: 67.7 mL/min (by C-G formula based on SCr of 0.79 mg/dL). Liver Function Tests: Recent Labs  Lab 11/24/22 0332 11/25/22 0107 11/26/22 0329 11/27/22 0426 11/28/22 0242  AST 22 20 22 21 20   ALT 31 28 23 21 20   ALKPHOS 58 59 61 56 63  BILITOT 1.1 1.0 1.2 1.3* 1.3*  PROT 5.5* 6.1* 5.8* 5.6* 6.2*  ALBUMIN 2.1* 2.2* 2.2* 2.0* 2.1*    No results for input(s): "LIPASE", "AMYLASE" in the last 168 hours.  No results for input(s): "AMMONIA" in the last 168 hours. Coagulation Profile: No results for input(s): "INR", "PROTIME" in the last 168 hours. Cardiac Enzymes: No results for input(s): "CKTOTAL", "CKMB", "CKMBINDEX", "TROPONINI" in the last 168 hours. BNP (last 3 results) No results for input(s): "PROBNP" in the last 8760 hours. HbA1C: No results for input(s): "HGBA1C" in the last 72 hours. CBG: No results for input(s): "GLUCAP" in the last 168 hours. Lipid Profile: No results for input(s): "CHOL", "HDL", "LDLCALC", "TRIG", "CHOLHDL", "LDLDIRECT" in the last 72 hours. Thyroid Function Tests: No results for input(s): "TSH", "T4TOTAL", "FREET4", "T3FREE", "THYROIDAB" in the last 72 hours. Anemia Panel: No results for input(s): "VITAMINB12", "FOLATE", "FERRITIN", "TIBC", "IRON", "RETICCTPCT" in the last 72 hours. Sepsis Labs: No results for input(s): "PROCALCITON", "LATICACIDVEN" in the last 168 hours.  No results found for this or any previous visit (from the past 240 hour(s)).   Radiology Studies: No results found.  Scheduled Meds:  enoxaparin (LOVENOX) injection  40 mg Subcutaneous Q24H   feeding supplement  237 mL Oral TID BM   guaiFENesin  600 mg Oral BID   hydrocortisone cream   Topical  QID   pantoprazole (PROTONIX) IV  40 mg Intravenous Q24H   polyethylene glycol  17 g Oral TID   senna-docusate  1 tablet Oral QHS   sodium chloride  1 drop Both Eyes QHS  Continuous Infusions:  methocarbamol (ROBAXIN) IV     piperacillin-tazobactam (ZOSYN)  IV 3.375 g (11/28/22 0821)     LOS: 11 days   Hughie Closs, MD [facility]  11/28/2022, 11:37 AM   *Please note that this is a verbal dictation therefore any spelling or grammatical errors are due to the "Dragon Medical One" system interpretation.  Please page via Amion and do not message via secure chat for urgent patient care matters. Secure chat can be used for non urgent patient care matters.  How to contact the [facility] Attending or Consulting provider 7A - 7P or covering provider during after hours 7P -7A, for this patient?  Check the care team in [facility] and look for a) attending/consulting [facility] provider listed and b) the [facility] team listed. Page or secure chat 7A-7P. Log into [email or web address] and use [place]'s universal password to access. If you do not have the password, please contact the hospital operator. Locate the [facility] provider you are looking for under [facility] and page to a number that you can be directly reached. If you still have difficulty reaching the provider, please page the [facility] (Director on Call) for the Hospitalists listed on amion for assistance.

## 2022-11-28 NOTE — Progress Notes (Signed)
Progress Note   Subjective  Chief Complaint: Choledocholithiasis/gallstone pancreatitis/constipation  Today, patient is found resting, his wife and his daughter are still by his bedside.  They are concerned that he is not getting adequate nutrition.  They tell him that he only takes a couple of bites of foods and a couple of sips of boost.  He apparently is having some discomfort with eating.  His wife tells me he had trouble even with the Jell-O.  They do not want him to get weaker so that he cannot recover.   Objective   Vital signs in last 24 hours: Temp:  [98 F (36.7 C)-98.7 F (37.1 C)] 98 F (36.7 C) (05/26 0837) Pulse Rate:  [71-76] 71 (05/26 0837) Resp:  [16-18] 16 (05/26 0837) BP: (111-142)/(61-72) 111/61 (05/26 0837) SpO2:  [95 %-99 %] 95 % (05/26 0837) Last BM Date : 11/27/22 General:    white male in NAD Heart:  Regular rate and rhythm; no murmurs Lungs: Respirations even and unlabored, lungs CTA bilaterally Abdomen:  Soft, generalized ttp, some worse in the epigastrum and nondistended. Normal bowel sounds. Psych:  Sleepy  Intake/Output from previous day: 05/25 0701 - 05/26 0700 In: 480 [P.O.:480] Out: -    Lab Results: Recent Labs    11/26/22 0329 11/27/22 0426 11/28/22 0242  WBC 21.5* 19.0* 17.4*  HGB 11.4* 11.5* 11.2*  HCT 34.4* 34.5* 34.1*  PLT 268 297 307   BMET Recent Labs    11/26/22 0329 11/27/22 0426 11/28/22 0242  NA 131* 135 132*  K 3.5 3.9 3.7  CL 94* 97* 97*  CO2 24 29 26   GLUCOSE 170* 164* 170*  BUN 14 10 9   CREATININE 0.87 0.93 0.79  CALCIUM 7.7* 7.9* 7.6*      Latest Ref Rng & Units 11/28/2022    2:42 AM 11/27/2022    4:26 AM 11/26/2022    3:29 AM  Hepatic Function  Total Protein 6.5 - 8.1 g/dL 6.2  5.6  5.8   Albumin 3.5 - 5.0 g/dL 2.1  2.0  2.2   AST 15 - 41 U/L 20  21  22    ALT 0 - 44 U/L 20  21  23    Alk Phosphatase 38 - 126 U/L 63  56  61   Total Bilirubin 0.3 - 1.2 mg/dL 1.3  1.3  1.2       Assessment / Plan:    Assessment: 1.  Choledocholithiasis/gallstone pancreatitis: ERCP attempted 5/20 to remove stone from CBD but unfortunately due to edema was not successful and the procedure was aborted, follow-up MRCP showed persistent small stone in the CBD and changes of pancreatitis along with a small fluid collection, patient monitor with conservative measures and tentative plan to consider another ERCP hopefully Tuesday after inflammation/edema has improved, white count improving over the past 48 hours after addition of antibiotics, biggest concern now is for nutrition 2.  Constipation  Plan: 1.  Continue laxatives as ordered, MiraLAX 3 times daily 2.  Discussed backing off to a clear liquid diet but it sounds like the patient is not thriving with any food at all, need to consider alternate forms of nutrition.  Will discuss further with Dr. Adela Lank. 3.  Continue to trend labs 4.  Continue analgesics and antiemetics 5.  Plans per Dr. Adela Lank for repeat MRCP tomorrow, will go ahead and order this  Thank you for your kind consultation, we will continue to follow    LOS: 11 days   Victorino Dike  Cristie Hem  11/28/2022, 10:48 AM

## 2022-11-29 ENCOUNTER — Inpatient Hospital Stay (HOSPITAL_COMMUNITY): Payer: HMO

## 2022-11-29 DIAGNOSIS — K8050 Calculus of bile duct without cholangitis or cholecystitis without obstruction: Secondary | ICD-10-CM | POA: Diagnosis not present

## 2022-11-29 DIAGNOSIS — K851 Biliary acute pancreatitis without necrosis or infection: Secondary | ICD-10-CM | POA: Diagnosis not present

## 2022-11-29 LAB — CBC WITH DIFFERENTIAL/PLATELET
Abs Immature Granulocytes: 0.07 10*3/uL (ref 0.00–0.07)
Basophils Absolute: 0.1 10*3/uL (ref 0.0–0.1)
Basophils Relative: 0 %
Eosinophils Absolute: 0.1 10*3/uL (ref 0.0–0.5)
Eosinophils Relative: 1 %
HCT: 34.9 % — ABNORMAL LOW (ref 39.0–52.0)
Hemoglobin: 11.6 g/dL — ABNORMAL LOW (ref 13.0–17.0)
Immature Granulocytes: 1 %
Lymphocytes Relative: 21 %
Lymphs Abs: 3 10*3/uL (ref 0.7–4.0)
MCH: 28.4 pg (ref 26.0–34.0)
MCHC: 33.2 g/dL (ref 30.0–36.0)
MCV: 85.3 fL (ref 80.0–100.0)
Monocytes Absolute: 1.2 10*3/uL — ABNORMAL HIGH (ref 0.1–1.0)
Monocytes Relative: 8 %
Neutro Abs: 9.6 10*3/uL — ABNORMAL HIGH (ref 1.7–7.7)
Neutrophils Relative %: 69 %
Platelets: 319 10*3/uL (ref 150–400)
RBC: 4.09 MIL/uL — ABNORMAL LOW (ref 4.22–5.81)
RDW: 14.5 % (ref 11.5–15.5)
WBC: 14 10*3/uL — ABNORMAL HIGH (ref 4.0–10.5)
nRBC: 0 % (ref 0.0–0.2)

## 2022-11-29 LAB — COMPREHENSIVE METABOLIC PANEL
ALT: 21 U/L (ref 0–44)
AST: 20 U/L (ref 15–41)
Albumin: 2.2 g/dL — ABNORMAL LOW (ref 3.5–5.0)
Alkaline Phosphatase: 60 U/L (ref 38–126)
Anion gap: 9 (ref 5–15)
BUN: 10 mg/dL (ref 8–23)
CO2: 25 mmol/L (ref 22–32)
Calcium: 7.9 mg/dL — ABNORMAL LOW (ref 8.9–10.3)
Chloride: 97 mmol/L — ABNORMAL LOW (ref 98–111)
Creatinine, Ser: 0.87 mg/dL (ref 0.61–1.24)
GFR, Estimated: >60 mL/min (ref >60)
Glucose, Bld: 164 mg/dL — ABNORMAL HIGH (ref 70–99)
Potassium: 3.9 mmol/L (ref 3.5–5.1)
Sodium: 131 mmol/L — ABNORMAL LOW (ref 135–145)
Total Bilirubin: 1.3 mg/dL — ABNORMAL HIGH (ref 0.3–1.2)
Total Protein: 6.4 g/dL — ABNORMAL LOW (ref 6.5–8.1)

## 2022-11-29 MED ORDER — PHENOL 1.4 % MT LIQD
1.0000 | OROMUCOSAL | Status: DC | PRN
  Administered 2022-11-29: 1 via OROMUCOSAL

## 2022-11-29 MED ORDER — GADOBUTROL 1 MMOL/ML IV SOLN
7.0000 mL | Freq: Once | INTRAVENOUS | Status: AC | PRN
  Administered 2022-11-29: 7 mL via INTRAVENOUS

## 2022-11-29 NOTE — Progress Notes (Signed)
Progress Note   Subjective  Wife at bedside. He hasn't eaten much since I last saw them yesterday, not taking much of the Boost / Ensure. Actually says pain is a bit better. Had a good bowel movement today. Ambulating.   Objective   Vital signs in last 24 hours: Temp:  [97.9 F (36.6 C)-98.5 F (36.9 C)] 97.9 F (36.6 C) (05/27 1044) Pulse Rate:  [71-78] 72 (05/27 1044) Resp:  [14-18] 16 (05/27 1044) BP: (127-146)/(67-81) 129/67 (05/27 1044) SpO2:  [96 %-99 %] 96 % (05/27 1044) Last BM Date : 11/29/22 General:    white male in NAD Neurologic:  Alert and oriented,  grossly normal neurologically. Psych:  Cooperative. Normal mood and affect.  Intake/Output from previous day: No intake/output data recorded. Intake/Output this shift: No intake/output data recorded.  Lab Results: Recent Labs    11/27/22 0426 11/28/22 0242 11/29/22 0301  WBC 19.0* 17.4* 14.0*  HGB 11.5* 11.2* 11.6*  HCT 34.5* 34.1* 34.9*  PLT 297 307 319   BMET Recent Labs    11/27/22 0426 11/28/22 0242 11/29/22 0301  NA 135 132* 131*  K 3.9 3.7 3.9  CL 97* 97* 97*  CO2 29 26 25   GLUCOSE 164* 170* 164*  BUN 10 9 10   CREATININE 0.93 0.79 0.87  CALCIUM 7.9* 7.6* 7.9*   LFT Recent Labs    11/29/22 0301  PROT 6.4*  ALBUMIN 2.2*  AST 20  ALT 21  ALKPHOS 60  BILITOT 1.3*   PT/INR No results for input(s): "LABPROT", "INR" in the last 72 hours.  Studies/Results: MR ABDOMEN MRCP W WO CONTAST  Result Date: 11/29/2022 CLINICAL DATA:  82 year old male with history of epigastric pain. Suspected choledocholithiasis. EXAM: MRI ABDOMEN WITHOUT AND WITH CONTRAST (INCLUDING MRCP) TECHNIQUE: Multiplanar multisequence MR imaging of the abdomen was performed both before and after the administration of intravenous contrast. Heavily T2-weighted images of the biliary and pancreatic ducts were obtained, and three-dimensional MRCP images were rendered by post processing. CONTRAST:  7mL GADAVIST GADOBUTROL  1 MMOL/ML IV SOLN COMPARISON:  Abdominal MRI 11/22/2022. FINDINGS: Lower chest: Linear areas of increased signal intensity in the lower lobes of the lungs bilaterally, likely reflective of areas of subsegmental atelectasis, but poorly evaluated on today's magnetic resonance examination. Hepatobiliary: No suspicious cystic or solid hepatic lesions. No intrahepatic biliary ductal dilatation noted on MRCP images. Common bile duct measures 7 mm in the porta hepatis which is within normal limits given the patient's advanced age. The more distal common bile duct is markedly narrowed (best appreciated on coronal image 21 of series 3) as it traverses through the pancreatic head, likely secondary to extrinsic mass effect. Small filling defect in the distal common bile duct immediately above the ampulla (coronal image 20 of series 3) measuring 3 mm, again concerning for potential choledocholithiasis. Numerous small filling defects are also noted lying dependently in the gallbladder, compatible with gallstones. Gallbladder is moderately distended. However, the gallbladder wall thickness is normal. No pericholecystic fluid or surrounding inflammatory changes. Pancreas: Again noted is a poorly defined mildly T2 hyperintense lesion in the head of the pancreas (axial image 27 of series 4) which is currently estimated to measure approximately 1.3 x 1.2 cm, and is associated with lack of enhancement on post gadolinium imaging (axial image 77 of series 12/04), suggesting an area of pancreatic necrosis. Pre gadolinium T1 weighted images also demonstrate areas of high T1 signal intensity in the inferior aspect of the pancreatic head, as well  as the anterior aspect of the pancreatic body and tail, as well as in the surrounding soft tissues, indicative of areas of hemorrhage, along with probable proteinaceous/hemorrhagic debris. These regions also demonstrate associated areas of high T2 signal intensity, with peripheral rim enhancement  on post gadolinium imaging, most evident adjacent to the inferior aspect of the pancreatic head extending into the root of the small bowel mesentery, and adjacent to the tail of the pancreas extending toward the splenic hilum, compatible with developing pancreatic pseudocysts. These exert mass effect upon adjacent structures, causing marked narrowing of the distal common bile duct, in addition to substantial narrowing of the superior mesenteric vein and splenoportal confluence (these veins remain patent at this time). Spleen:  Unremarkable. Adrenals/Urinary Tract: Bilateral kidneys and adrenal glands are normal in appearance. No hydroureteronephrosis in the visualized portions of the abdomen. Stomach/Bowel: Unremarkable. Vascular/Lymphatic: Aortic atherosclerosis. No aneurysm identified in the visualized abdominal vasculature. Marked narrowing of the superior mesenteric vein, distal splenic vein and splenoportal confluence secondary to mass effect from surrounding inflammation and developing pancreatic pseudocysts (discussed above). Other:  Trace volume of ascites. Musculoskeletal: No aggressive appearing osseous lesions are noted in the visualized portions of the skeleton. IMPRESSION: 1. 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 2. Pancreatic inflammation exerts mass effect upon the distal common bile duct which is markedly narrowed. In addition, there continues to be a small 3 mm filling defect in the distal common bile duct immediately above the level of the ampulla suspicious for choledocholithiasis. Despite these findings, the common bile duct is within normal limits given the patient's age, and there is no intrahepatic biliary ductal dilatation to suggest clinically significant biliary obstruction at this time. 3.  Cholelithiasis without evidence of acute cholecystitis at this time. 4. Aortic atherosclerosis. Electronically Signed   By: Trudie Reed M.D.   On: 11/29/2022 12:47       Assessment / Plan:    82 y/o male here with the following:  Gallstone pancreatitis Choledocholithiasis Constipation  Unfortunately ERCP attempt 5/20 was not successful due to edema and the procedure was aborted. He has been on the ward the past several days recovering, ambulating and had initially tolerated a low fat diet pretty well. Last 2 days his oral intake has reduced. We discussed feeding tube placement yesterday, he wanted to think about it and held off.   We repeated an MRCP today and unfortunately he still has a small stone in the CBD. Additionally he has some local complication of pancreatitis - edema is worse with areas of hemorrhage, pseudocysts, and a small area of necrosis. Further, the inflammation is causing mass effect on the distal CBD. I discussed the findings with the patient and his wife and concerned about these changes. On the other hand, WBC is downtrending, and he is feeling a bit better in regards to his pain.  He had significant edema around the papilla on the last ERCP which was not successful. Given MRCP findings I suspect he likely needs more time until another ERCP attempt although I defer that decision to my advanced endoscopy colleagues. I will discuss his case with Dr. Meridee Score who is assuming his GI care tomorrow. In the interim, his nutritional status is worsening and he is now willing to have a feeding tube placed. Dobhoff ordered today to start tube feeds. Patient will continue to ambulate, continue  Miralax for bowel regimen which has helped him in light of his narcotic use.   Expectation is that this will take some time for him to heal / recover and anticipate a prolonged hospitalization, we discussed this and they understand. All questions answered.   PLAN: - place Dobhoff today  and start tube feeds - continue to trend labs - continue Miralax - ERCP / cholecystectomy at some point - will discuss his case with Dr. Meridee Score who is assuming his GI care tomorrow.  Harlin Rain, MD [facility]

## 2022-11-29 NOTE — Plan of Care (Signed)

## 2022-11-29 NOTE — Progress Notes (Signed)
PROGRESS NOTE    Jon Bray  WUJ:[id number] DOB: [birth year] DOA: 11/17/2022 PCP: Danella Penton, MD   Brief Narrative:  Jon Bray is a 82 y.o. male with PMH significant for DM2, HTN, HLD, CAD/NSTEMI/stent 2014, paroxysmal A-fib 5/14, patient was brought to the ED at [facility] with complaint of worsening nausea, vomiting.   In March, patient saw his primary care provider for loss of appetite, weight loss. Workup showed elevated liver enzymes and bilirubin.  3/27 CT abdomen and pelvis showed focal wall thickening in the urinary bladder adjacent to right UVJ suspicious for neoplasm, also extensive sigmoid diverticulosis 3/28, MRI/MRCP showed choledocholithiasis with 2 tiny gallstones near the ampulla measuring 0.2 to 0.3 cm and mild intra and extrahepatic biliary duct dilatation, CBD dilated to 0.9 cm Patient refused to go to ED for urgent evaluation.  PCP sent referral to general surgery and GI 4/9, seen by surgeon Dr. Trisha Mangle.  Cholecystectomy was recommended but patient wanted to wait out.   5/14, patient was brought to the ED for 3 days of nausea, vomiting. Initial workup showed lipase elevated over 10,000, LFTs elevated as well MRI abdomen showed acute interstitial pancreatitis, choledocholithiasis with 4 mm stone in distal CBD. He was started on conservative management with IV fluid, IV analgesia, IV antiemetics, bowel rest Admitted to [facility] GI and general surgery were consulted. 5/20, ERCP was attempted but had to be aborted due to inability to identify the papula.  Follow-up MRCP showed persistent small stone in the CBD, changes of pancreatitis along with small fluid collection. Overall, liver enzymes continue to improve, Currently, patient is tentatively planned for ERCP by Dr. Meridee Score next week.  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 GI and general surgery following Pancreatitis seems to have improved.  Currently able to tolerate regular consistency diet, LFTs normalized and bilirubin fairly stable.  Underwent MRCP today, results pending.  May need ERCP tomorrow based on the results.  Leukocytosis improving.  He is afebrile.  Management per GI and general surgery continue IV Zosyn.  Patient on 3 times daily boost.  He did not initiate any conversation about feedings today and neither did his wife.  Bibasilar atelectasis Persistent bibasilar atelectasis noted in chest x-ray done on 5/18 and repeated 5/24.  But he is asymptomatic and not hypoxic.  Incentive spirometry encouraged.  Hyponatremia: Resolved.  CAD/NSTEMI/stent 2014 HLD No anginal symptoms currently. PTA on aspirin 81 mg daily.  Currently on hold Not on statin as an outpatient   paroxysmal A-fib  Was on aspirin daily which is currently on hold. Not on anticoagulation as an outpatient   Type 2 diabetes mellitus Diet controlled.  Not on meds, A1c 5.8 on 11/19/2022   GERD PPI   Bladder wall thickening 327, CT abdomen and pelvis showed focal wall thickening in the bladder adjacent to the right UVJ suspicious for neoplasm.  Radiologist recommended cystoscopy for further evaluation. Urinalysis 11/16/2022 did not show any evidence of hematuria. Patient/family have not noticed any hematuria thereafter either. 5/22 I sent an epic message to on-call urology Dr. McDiarmid, recommended outpatient follow-up for further workup including cystoscopy.[name]     Extensive sigmoid diverticulosis  Chronic constipation Bowel regimen   Rash  on the back. Possible associated with newly linen material. On Hydrocortisone cream.  No complaints today.  DVT prophylaxis: enoxaparin  (LOVENOX) injection 40 mg Start: 11/23/22 1000 SCDs Start: 11/17/22 1732   Code Status: Full Code  Family Communication:  None present at bedside.  Plan of care discussed with patient in length and he/she verbalized understanding and agreed with it.  Status is: Inpatient Remains inpatient appropriate because: Scheduled for ERCP next week   Estimated body mass index is 25.54 kg/m as calculated from the following:   Height as of this encounter: 5\' 8"  (1.727 m).   Weight as of this encounter: 76.2 kg.    Nutritional Assessment: Body mass index is 25.54 kg/m.[name] Seen by dietician.  I agree with the assessment and plan as outlined below: Nutrition Status:        . Skin Assessment: I have examined the patient's skin and I agree with the wound assessment as performed by the wound care RN as outlined below:    Consultants:  GI and general surgery  Procedures:  As above  Antimicrobials:  Anti-infectives (From admission, onward)    Start     Dose/Rate Route Frequency Ordered Stop   11/25/22 1400  piperacillin-tazobactam (ZOSYN) IVPB 3.375 g        3.375 g 12.5 mL/hr over 240 Minutes Intravenous Every 8 hours 11/25/22 1136     11/18/22 0600  piperacillin-tazobactam (ZOSYN) IVPB 3.375 g  Status:  Discontinued        3.375 g 12.5 mL/hr over 240 Minutes Intravenous Every 8 hours 11/17/22 1753 11/24/22 0949   11/17/22 1845  piperacillin-tazobactam (ZOSYN) IVPB 3.375 g        3.375 g 100 mL/hr over 30 Minutes Intravenous  Once 11/17/22 1753 11/17/22 2012         Subjective: Patient seen and examined.  No new complaint.  Persistent abdominal pain but that is improving.  He appears stable.  Wife at the bedside.  Objective: Vitals:   11/28/22 1812 11/28/22 2006 11/29/22 0514 11/29/22 1044  BP: (!) 146/74 136/81 127/75 129/67  Pulse: 78 72 71 72  Resp: 14 16 18 16   Temp: 98.3 F (36.8 C) 97.9 F (36.6 C) 98.5 F (36.9 C) 97.9 F (36.6 C)  TempSrc: Oral Oral Oral Oral   SpO2: 99% 97% 96% 96%  Weight:      Height:       No intake or output data in the 24 hours ending 11/29/22 1200  Filed Weights   11/17/22 1833 11/22/22 1351  Weight: 76.2 kg 76.2 kg    Examination:  General exam: Appears calm and comfortable  Respiratory system: Clear to auscultation. Respiratory effort normal. Cardiovascular system: S1 & S2 heard, RRR. No JVD, murmurs, rubs, gallops or clicks. No pedal edema. Gastrointestinal system: Abdomen is nondistended, soft and mild periumbilical tenderness. No organomegaly or masses felt. Normal bowel sounds heard. Central nervous system: Alert and oriented. No focal neurological deficits. Extremities: Symmetric 5 x 5 power. Skin: No rashes, lesions or ulcers.  Psychiatry: Judgement and insight appear normal. Mood & affect appropriate.   Data Reviewed: I have personally reviewed following labs and imaging studies  CBC: Recent Labs  Lab 11/25/22 0107 11/26/22 0329 11/27/22 0426 11/28/22 0242 11/29/22 0301  WBC 20.4* 21.5* 19.0* 17.4* 14.0*  NEUTROABS  --   --  14.3* 12.7* 9.6*  HGB 12.4* 11.4* 11.5* 11.2* 11.6*  HCT 38.0* 34.4* 34.5* 34.1* 34.9*  MCV 87.8 88.4 88.7 87.9 85.3  PLT  277 268 297 307 319    Basic Metabolic Panel: Recent Labs  Lab 11/25/22 0107 11/26/22 0329 11/27/22 0426 11/28/22 0242 11/29/22 0301  NA 137 131* 135 132* 131*  K 4.1 3.5 3.9 3.7 3.9  CL 99 94* 97* 97* 97*  CO2 29 24 29 26 25   GLUCOSE 165* 170* 164* 170* 164*  BUN 17 14 10 9 10   CREATININE 1.01 0.87 0.93 0.79 0.87  CALCIUM 7.8* 7.7* 7.9* 7.6* 7.9*    GFR: Estimated Creatinine Clearance: 62.2 mL/min (by C-G formula based on SCr of 0.87 mg/dL). Liver Function Tests: Recent Labs  Lab 11/25/22 0107 11/26/22 0329 11/27/22 0426 11/28/22 0242 11/29/22 0301  AST 20 22 21 20 20   ALT 28 23 21 20 21   ALKPHOS 59 61 56 63 60  BILITOT 1.0 1.2 1.3* 1.3* 1.3*  PROT 6.1* 5.8* 5.6* 6.2* 6.4*  ALBUMIN 2.2* 2.2* 2.0* 2.1* 2.2*    No results  for input(s): "LIPASE", "AMYLASE" in the last 168 hours.  No results for input(s): "AMMONIA" in the last 168 hours. Coagulation Profile: No results for input(s): "INR", "PROTIME" in the last 168 hours. Cardiac Enzymes: No results for input(s): "CKTOTAL", "CKMB", "CKMBINDEX", "TROPONINI" in the last 168 hours. BNP (last 3 results) No results for input(s): "PROBNP" in the last 8760 hours. HbA1C: No results for input(s): "HGBA1C" in the last 72 hours. CBG: No results for input(s): "GLUCAP" in the last 168 hours. Lipid Profile: No results for input(s): "CHOL", "HDL", "LDLCALC", "TRIG", "CHOLHDL", "LDLDIRECT" in the last 72 hours. Thyroid Function Tests: No results for input(s): "TSH", "T4TOTAL", "FREET4", "T3FREE", "THYROIDAB" in the last 72 hours. Anemia Panel: No results for input(s): "VITAMINB12", "FOLATE", "FERRITIN", "TIBC", "IRON", "RETICCTPCT" in the last 72 hours. Sepsis Labs: No results for input(s): "PROCALCITON", "LATICACIDVEN" in the last 168 hours.  No results found for this or any previous visit (from the past 240 hour(s)).   Radiology Studies: No results found.  Scheduled Meds:  enoxaparin (LOVENOX) injection  40 mg Subcutaneous Q24H   feeding supplement  237 mL Oral TID BM   guaiFENesin  600 mg Oral BID   hydrocortisone cream   Topical QID   pantoprazole (PROTONIX) IV  40 mg Intravenous Q24H   polyethylene glycol  17 g Oral TID   senna-docusate  1 tablet Oral QHS   sodium chloride  1 drop Both Eyes QHS   Continuous Infusions:  methocarbamol (ROBAXIN) IV     piperacillin-tazobactam (ZOSYN)  IV 3.375 g (11/29/22 0854)     LOS: 12 days   Hughie Closs, MD [facility]  11/29/2022, 12:00 PM   *Please note that this is a verbal dictation therefore any spelling or grammatical errors are due to the "[employer]agon [employer] One" system interpretation.  Please page via Amion and do not message via secure chat for urgent patient care matters. Secure chat can be used  for non urgent patient care matters.  How to contact the [facility] Attending or Consulting provider 7A - 7P or covering provider during after hours 7P -7A, for this patient?  Check the care team in [facility] and look for a) attending/consulting [facility] provider listed and b) the [facility] team listed. Page or secure chat 7A-7P. Log into [email or web address] and use [place]'s universal password to access. If you do not have the password, please contact the hospital operator. Locate the [facility] provider you are looking for under [facility] and page to a number that you can be directly reached. If you still have  difficulty reaching the provider, please page the [facility] (Director on Call) for the Hospitalists listed on amion for assistance.

## 2022-11-29 NOTE — Progress Notes (Addendum)
Clarified with MD blakemore order. Patient to have NGT insertion by nursing.

## 2022-11-30 DIAGNOSIS — E440 Moderate protein-calorie malnutrition: Secondary | ICD-10-CM

## 2022-11-30 DIAGNOSIS — K219 Gastro-esophageal reflux disease without esophagitis: Secondary | ICD-10-CM | POA: Diagnosis not present

## 2022-11-30 DIAGNOSIS — E785 Hyperlipidemia, unspecified: Secondary | ICD-10-CM

## 2022-11-30 DIAGNOSIS — K5900 Constipation, unspecified: Secondary | ICD-10-CM | POA: Diagnosis not present

## 2022-11-30 DIAGNOSIS — K851 Biliary acute pancreatitis without necrosis or infection: Secondary | ICD-10-CM | POA: Diagnosis not present

## 2022-11-30 DIAGNOSIS — Z9889 Other specified postprocedural states: Secondary | ICD-10-CM

## 2022-11-30 DIAGNOSIS — K859 Acute pancreatitis without necrosis or infection, unspecified: Secondary | ICD-10-CM

## 2022-11-30 DIAGNOSIS — K831 Obstruction of bile duct: Secondary | ICD-10-CM

## 2022-11-30 DIAGNOSIS — K8050 Calculus of bile duct without cholangitis or cholecystitis without obstruction: Secondary | ICD-10-CM | POA: Diagnosis not present

## 2022-11-30 DIAGNOSIS — K8591 Acute pancreatitis with uninfected necrosis, unspecified: Secondary | ICD-10-CM

## 2022-11-30 LAB — CBC WITH DIFFERENTIAL/PLATELET
Abs Immature Granulocytes: 0.08 10*3/uL — ABNORMAL HIGH (ref 0.00–0.07)
Basophils Absolute: 0.1 10*3/uL (ref 0.0–0.1)
Basophils Relative: 1 %
Eosinophils Absolute: 0.1 10*3/uL (ref 0.0–0.5)
Eosinophils Relative: 1 %
HCT: 36 % — ABNORMAL LOW (ref 39.0–52.0)
Hemoglobin: 11.8 g/dL — ABNORMAL LOW (ref 13.0–17.0)
Immature Granulocytes: 1 %
Lymphocytes Relative: 28 %
Lymphs Abs: 3.7 10*3/uL (ref 0.7–4.0)
MCH: 29 pg (ref 26.0–34.0)
MCHC: 32.8 g/dL (ref 30.0–36.0)
MCV: 88.5 fL (ref 80.0–100.0)
Monocytes Absolute: 1.2 10*3/uL — ABNORMAL HIGH (ref 0.1–1.0)
Monocytes Relative: 9 %
Neutro Abs: 8 10*3/uL — ABNORMAL HIGH (ref 1.7–7.7)
Neutrophils Relative %: 60 %
Platelets: 355 10*3/uL (ref 150–400)
RBC: 4.07 MIL/uL — ABNORMAL LOW (ref 4.22–5.81)
RDW: 14.6 % (ref 11.5–15.5)
WBC: 13.2 10*3/uL — ABNORMAL HIGH (ref 4.0–10.5)
nRBC: 0 % (ref 0.0–0.2)

## 2022-11-30 LAB — COMPREHENSIVE METABOLIC PANEL
ALT: 21 U/L (ref 0–44)
AST: 24 U/L (ref 15–41)
Albumin: 2.3 g/dL — ABNORMAL LOW (ref 3.5–5.0)
Alkaline Phosphatase: 64 U/L (ref 38–126)
Anion gap: 12 (ref 5–15)
BUN: 12 mg/dL (ref 8–23)
CO2: 24 mmol/L (ref 22–32)
Calcium: 7.5 mg/dL — ABNORMAL LOW (ref 8.9–10.3)
Chloride: 96 mmol/L — ABNORMAL LOW (ref 98–111)
Creatinine, Ser: 1.03 mg/dL (ref 0.61–1.24)
GFR, Estimated: >60 mL/min (ref >60)
Glucose, Bld: 141 mg/dL — ABNORMAL HIGH (ref 70–99)
Potassium: 4.1 mmol/L (ref 3.5–5.1)
Sodium: 132 mmol/L — ABNORMAL LOW (ref 135–145)
Total Bilirubin: 0.9 mg/dL (ref 0.3–1.2)
Total Protein: 6.6 g/dL (ref 6.5–8.1)

## 2022-11-30 LAB — MAGNESIUM
Magnesium: 2 mg/dL (ref 1.7–2.4)
Magnesium: 2.1 mg/dL (ref 1.7–2.4)

## 2022-11-30 LAB — PHOSPHORUS
Phosphorus: 3.4 mg/dL (ref 2.5–4.6)
Phosphorus: 3.1 mg/dL (ref 2.5–4.6)

## 2022-11-30 LAB — GLUCOSE, CAPILLARY
Glucose-Capillary: 173 mg/dL — ABNORMAL HIGH (ref 70–99)
Glucose-Capillary: 222 mg/dL — ABNORMAL HIGH (ref 70–99)
Glucose-Capillary: 226 mg/dL — ABNORMAL HIGH (ref 70–99)

## 2022-11-30 MED ORDER — FREE WATER
150.0000 mL | Status: DC
  Administered 2022-11-30 – 2022-12-08 (×38): 150 mL

## 2022-11-30 MED ORDER — OSMOLITE 1.5 CAL PO LIQD
1000.0000 mL | ORAL | Status: DC
  Administered 2022-11-30: 30 mL
  Administered 2022-11-30: 1000 mL
  Administered 2022-12-01: 40 mL
  Administered 2022-12-04 – 2022-12-06 (×2): 1000 mL
  Filled 2022-11-30 (×11): qty 1000

## 2022-11-30 MED ORDER — BOOST / RESOURCE BREEZE PO LIQD CUSTOM
1.0000 | Freq: Three times a day (TID) | ORAL | Status: DC
  Administered 2022-11-30 – 2022-12-08 (×19): 1 via ORAL

## 2022-11-30 MED ORDER — PROSOURCE TF20 ENFIT COMPATIBL EN LIQD
60.0000 mL | Freq: Every day | ENTERAL | Status: DC
  Administered 2022-11-30 – 2022-12-10 (×9): 60 mL
  Filled 2022-11-30 (×8): qty 60

## 2022-11-30 NOTE — Progress Notes (Signed)
PROGRESS NOTE    Jon Bray  WUJ:[id number] DOB: August 25, 1940 DOA: 11/17/2022 PCP: Danella Penton, MD   Brief Narrative:  Jon Bray is a 82 y.o. male with PMH significant for DM2, HTN, HLD, CAD/NSTEMI/stent 2014, paroxysmal A-fib 5/14, patient was brought to the ED at [facility] with complaint of worsening nausea, vomiting.   In March, patient saw his primary care provider for loss of appetite, weight loss. Workup showed elevated liver enzymes and bilirubin.  3/27 CT abdomen and pelvis showed focal wall thickening in the urinary bladder adjacent to right UVJ suspicious for neoplasm, also extensive sigmoid diverticulosis 3/28, MRI/MRCP showed choledocholithiasis with 2 tiny gallstones near the ampulla measuring 0.2 to 0.3 cm and mild intra and extrahepatic biliary duct dilatation, CBD dilated to 0.9 cm Patient refused to go to ED for urgent evaluation.  PCP sent referral to general surgery and GI 4/9, seen by surgeon Dr. Trisha Mangle.  Cholecystectomy was recommended but patient wanted to wait out.   5/14, patient was brought to the ED for 3 days of nausea, vomiting. Initial workup showed lipase elevated over 10,000, LFTs elevated as well MRI abdomen showed acute interstitial pancreatitis, choledocholithiasis with 4 mm stone in distal CBD. He was started on conservative management with IV fluid, IV analgesia, IV antiemetics, bowel rest Admitted to [facility] GI and general surgery were consulted. 5/20, ERCP was attempted but had to be aborted due to inability to identify the papula.  Follow-up MRCP showed persistent small stone in the CBD, changes of pancreatitis along with small fluid collection. Overall, liver enzymes continue to improve, Currently, patient is tentatively planned for ERCP by Dr. Meridee Score next week.  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 GI and general surgery following Pancreatitis seems to have improved.  Currently able to tolerate regular consistency diet, LFTs normalized and bilirubin fairly stable.  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  Pancreatic inflammation exerts mass effect on distal CBD, there continues to be 3 mm filling defect in distal CBD suspicious for choledocholithiasis.  Dr. Meridee Score RT to assess today and determine plan for ERCP. leukocytosis improving.  He is afebrile.  Abdominal pain is improving.  Patient elected to have NG tube placed 11/29/2022 and start tube feedings since he is having poor p.o. intake.  Wife a little frustrated since the tube feeds have not been started yet.  She was informed that dietitian was already consulted 11/29/2022 and has yet to see patient.  Bibasilar atelectasis Persistent bibasilar atelectasis noted in chest x-ray done on 5/18 and repeated 5/24.  But he is asymptomatic and not hypoxic.  Incentive spirometry encouraged.  Hyponatremia: Resolved.  CAD/NSTEMI/stent 2014 HLD No anginal symptoms currently. PTA on aspirin 81 mg daily.  Currently on hold Not on statin as an outpatient   paroxysmal A-fib  Was on aspirin daily which is currently on hold. Not on anticoagulation as an outpatient   Type 2 diabetes mellitus Diet controlled.  Not on meds, A1c 5.8 on 11/19/2022   GERD PPI   Bladder wall thickening 327, CT abdomen and pelvis showed focal wall thickening in the  bladder adjacent to the right UVJ suspicious for neoplasm.  Radiologist recommended cystoscopy for further evaluation. Urinalysis 11/16/2022 did not show any evidence of  hematuria. Patient/family have not noticed any hematuria thereafter either. 5/22 I sent an epic message to on-call urology Dr. McDiarmid, recommended outpatient follow-up for further workup including cystoscopy.[name]     Extensive sigmoid diverticulosis  Chronic constipation Bowel regimen   Rash on the back. Possible associated with newly linen material. On Hydrocortisone cream.  No complaints today.  DVT prophylaxis: enoxaparin (LOVENOX) injection 40 mg Start: 11/23/22 1000 SCDs Start: 11/17/22 1732   Code Status: Full Code  Family Communication: Wife present at bedside.  Plan of care discussed with patient in length and he/she verbalized understanding and agreed with it.  Status is: Inpatient Remains inpatient appropriate because: Plan for ERCP this week.   Estimated body mass index is 25.54 kg/m as calculated from the following:   Height as of this encounter: 5\' 8"  (1.727 m).   Weight as of this encounter: 76.2 kg.    Nutritional Assessment: Body mass index is 25.54 kg/m.[name] Seen by dietician.  I agree with the assessment and plan as outlined below: Nutrition Status:        . Skin Assessment: I have examined the patient's skin and I agree with the wound assessment as performed by the wound care RN as outlined below:    Consultants:  GI and general surgery  Procedures:  As above  Antimicrobials:  Anti-infectives (From admission, onward)    Start     Dose/Rate Route Frequency Ordered Stop   11/25/22 1400  piperacillin-tazobactam (ZOSYN) IVPB 3.375 g        3.375 g 12.5 mL/hr over 240 Minutes Intravenous Every 8 hours 11/25/22 1136     11/18/22 0600  piperacillin-tazobactam (ZOSYN) IVPB 3.375 g  Status:  Discontinued        3.375 g 12.5 mL/hr over 240 Minutes Intravenous Every 8 hours 11/17/22 1753 11/24/22 0949   11/17/22 1845  piperacillin-tazobactam (ZOSYN) IVPB 3.375 g        3.375 g 100 mL/hr over 30 Minutes Intravenous  Once 11/17/22 1753 11/17/22 2012          Subjective: Seen and examined.  Wife at the bedside.  Patient states that his abdominal pain is improving.  Interestingly, he has eaten slightly more breakfast today than ever before to this hospitalization.  Objective: Vitals:   11/29/22 1558 11/29/22 2015 11/30/22 0358 11/30/22 0747  BP: 137/79 (!) 141/77 (!) 157/80 135/77  Pulse: 80 76 81 75  Resp: 16 16 18 18   Temp: 98.6 F (37 C) 98.5 F (36.9 C) 98 F (36.7 C) (!) 97.5 F (36.4 C)  TempSrc: Oral Oral Oral Oral  SpO2: 97% 97% 95% 95%  Weight:      Height:        Intake/Output Summary (Last 24 hours) at 11/30/2022 0922 Last data filed at 11/30/2022 0912 Gross per 24 hour  Intake 634.8 ml  Output --  Net 634.8 ml    Filed Weights   11/17/22 1833 11/22/22 1351  Weight: 76.2 kg 76.2 kg    Examination:  General exam: Appears calm and comfortable  Respiratory system: Clear to auscultation. Respiratory effort normal. Cardiovascular system: S1 & S2 heard, RRR. No JVD, murmurs, rubs, gallops or clicks. No pedal edema. Gastrointestinal system: Abdomen is nondistended, soft and mild periumbilical tenderness. No organomegaly or masses felt. Normal bowel sounds heard. Central nervous system: Alert and oriented. No focal  neurological deficits. Extremities: Symmetric 5 x 5 power. Skin: No rashes, lesions or ulcers.  Psychiatry: Judgement and insight appear normal. Mood & affect appropriate.   Data Reviewed: I have personally reviewed following labs and imaging studies  CBC: Recent Labs  Lab 11/26/22 0329 11/27/22 0426 11/28/22 0242 11/29/22 0301 11/30/22 0310  WBC 21.5* 19.0* 17.4* 14.0* 13.2*  NEUTROABS  --  14.3* 12.7* 9.6* 8.0*  HGB 11.4* 11.5* 11.2* 11.6* 11.8*  HCT 34.4* 34.5* 34.1* 34.9* 36.0*  MCV 88.4 88.7 87.9 85.3 88.5  PLT 268 297 307 319 355    Basic Metabolic Panel: Recent Labs  Lab 11/26/22 0329 11/27/22 0426 11/28/22 0242 11/29/22 0301 11/30/22 0310  NA 131* 135 132* 131* 132*  K 3.5  3.9 3.7 3.9 4.1  CL 94* 97* 97* 97* 96*  CO2 24 29 26 25 24   GLUCOSE 170* 164* 170* 164* 141*  BUN 14 10 9 10 12   CREATININE 0.87 0.93 0.79 0.87 1.03  CALCIUM 7.7* 7.9* 7.6* 7.9* 7.5*    GFR: Estimated Creatinine Clearance: 52.6 mL/min (by C-G formula based on SCr of 1.03 mg/dL). Liver Function Tests: Recent Labs  Lab 11/26/22 0329 11/27/22 0426 11/28/22 0242 11/29/22 0301 11/30/22 0310  AST 22 21 20 20 24   ALT 23 21 20 21 21   ALKPHOS 61 56 63 60 64  BILITOT 1.2 1.3* 1.3* 1.3* 0.9  PROT 5.8* 5.6* 6.2* 6.4* 6.6  ALBUMIN 2.2* 2.0* 2.1* 2.2* 2.3*    No results for input(s): "LIPASE", "AMYLASE" in the last 168 hours.  No results for input(s): "AMMONIA" in the last 168 hours. Coagulation Profile: No results for input(s): "INR", "PROTIME" in the last 168 hours. Cardiac Enzymes: No results for input(s): "CKTOTAL", "CKMB", "CKMBINDEX", "TROPONINI" in the last 168 hours. BNP (last 3 results) No results for input(s): "PROBNP" in the last 8760 hours. HbA1C: No results for input(s): "HGBA1C" in the last 72 hours. CBG: No results for input(s): "GLUCAP" in the last 168 hours. Lipid Profile: No results for input(s): "CHOL", "HDL", "LDLCALC", "TRIG", "CHOLHDL", "LDLDIRECT" in the last 72 hours. Thyroid Function Tests: No results for input(s): "TSH", "T4TOTAL", "FREET4", "T3FREE", "THYROIDAB" in the last 72 hours. Anemia Panel: No results for input(s): "VITAMINB12", "FOLATE", "FERRITIN", "TIBC", "IRON", "RETICCTPCT" in the last 72 hours. Sepsis Labs: No results for input(s): "PROCALCITON", "LATICACIDVEN" in the last 168 hours.  No results found for this or any previous visit (from the past 240 hour(s)).   Radiology Studies: DG Abd 1 View  Result Date: 11/29/2022 CLINICAL DATA:  NG tube placement. EXAM: ABDOMEN - 1 VIEW COMPARISON:  Nov 19, 2022 FINDINGS: Enteric catheter terminates in the expected location of the gastric antrum. Limited visualization of the abdomen demonstrates  nonspecific bowel gas pattern. IMPRESSION: Enteric catheter terminates in the expected location of the gastric antrum. Electronically Signed   By: Ted Mcalpine M.D.   On: 11/29/2022 15:48   MR ABDOMEN MRCP W WO CONTAST  Result Date: 11/29/2022 CLINICAL DATA:  82 year old male with history of epigastric pain. Suspected choledocholithiasis. EXAM: MRI ABDOMEN WITHOUT AND WITH CONTRAST (INCLUDING MRCP) TECHNIQUE: Multiplanar multisequence MR imaging of the abdomen was performed both before and after the administration of intravenous contrast. Heavily T2-weighted images of the biliary and pancreatic ducts were obtained, and three-dimensional MRCP images were rendered by post processing. CONTRAST:  7mL GADAVIST GADOBUTROL 1 MMOL/ML IV SOLN COMPARISON:  Abdominal MRI 11/22/2022. FINDINGS: Lower chest: Linear areas of increased signal intensity in the lower lobes of the lungs bilaterally,  likely reflective of areas of subsegmental atelectasis, but poorly evaluated on today's magnetic resonance examination. Hepatobiliary: No suspicious cystic or solid hepatic lesions. No intrahepatic biliary ductal dilatation noted on MRCP images. Common bile duct measures 7 mm in the porta hepatis which is within normal limits given the patient's advanced age. The more distal common bile duct is markedly narrowed (best appreciated on coronal image 21 of series 3) as it traverses through the pancreatic head, likely secondary to extrinsic mass effect. Small filling defect in the distal common bile duct immediately above the ampulla (coronal image 20 of series 3) measuring 3 mm, again concerning for potential choledocholithiasis. Numerous small filling defects are also noted lying dependently in the gallbladder, compatible with gallstones. Gallbladder is moderately distended. However, the gallbladder wall thickness is normal. No pericholecystic fluid or surrounding inflammatory changes. Pancreas: Again noted is a poorly defined  mildly T2 hyperintense lesion in the head of the pancreas (axial image 27 of series 4) which is currently estimated to measure approximately 1.3 x 1.2 cm, and is associated with lack of enhancement on post gadolinium imaging (axial image 77 of series 12/04), suggesting an area of pancreatic necrosis. Pre gadolinium T1 weighted images also demonstrate areas of high T1 signal intensity in the inferior aspect of the pancreatic head, as well as the anterior aspect of the pancreatic body and tail, as well as in the surrounding soft tissues, indicative of areas of hemorrhage, along with probable proteinaceous/hemorrhagic debris. These regions also demonstrate associated areas of high T2 signal intensity, with peripheral rim enhancement on post gadolinium imaging, most evident adjacent to the inferior aspect of the pancreatic head extending into the root of the small bowel mesentery, and adjacent to the tail of the pancreas extending toward the splenic hilum, compatible with developing pancreatic pseudocysts. These exert mass effect upon adjacent structures, causing marked narrowing of the distal common bile duct, in addition to substantial narrowing of the superior mesenteric vein and splenoportal confluence (these veins remain patent at this time). Spleen:  Unremarkable. Adrenals/Urinary Tract: Bilateral kidneys and adrenal glands are normal in appearance. No hydroureteronephrosis in the visualized portions of the abdomen. Stomach/Bowel: Unremarkable. Vascular/Lymphatic: Aortic atherosclerosis. No aneurysm identified in the visualized abdominal vasculature. Marked narrowing of the superior mesenteric vein, distal splenic vein and splenoportal confluence secondary to mass effect from surrounding inflammation and developing pancreatic pseudocysts (discussed above). Other:  Trace volume of ascites. Musculoskeletal: No aggressive appearing osseous lesions are noted in the visualized portions of the skeleton. IMPRESSION: 1.  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 2. Pancreatic inflammation exerts mass effect upon the distal common bile duct which is markedly narrowed. In addition, there continues to be a small 3 mm filling defect in the distal common bile duct immediately above the level of the ampulla suspicious for choledocholithiasis. Despite these findings, the common bile duct is within normal limits given the patient's age, and there is no intrahepatic biliary ductal dilatation to suggest clinically significant biliary obstruction at this time. 3. Cholelithiasis without evidence of acute cholecystitis at this time. 4. Aortic atherosclerosis. Electronically Signed   By: Trudie Reed M.D.   On: 11/29/2022 12:47    Scheduled Meds:  enoxaparin (LOVENOX) injection  40 mg Subcutaneous Q24H   feeding supplement  237 mL Oral TID BM   guaiFENesin  600 mg Oral BID   hydrocortisone  cream   Topical QID   pantoprazole (PROTONIX) IV  40 mg Intravenous Q24H   polyethylene glycol  17 g Oral TID   senna-docusate  1 tablet Oral QHS   sodium chloride  1 drop Both Eyes QHS   Continuous Infusions:  methocarbamol (ROBAXIN) IV     piperacillin-tazobactam (ZOSYN)  IV 3.375 g (11/30/22 0856)     LOS: 13 days   Hughie Closs, MD [facility]  11/30/2022, 9:22 AM   *Please note that this is a verbal dictation therefore any spelling or grammatical errors are due to the "[employer]agon [employer] One" system interpretation.  Please page via Amion and do not message via secure chat for urgent patient care matters. Secure chat can be used for non urgent patient care matters.  How to contact the [facility] Attending or Consulting provider 7A - 7P or covering provider during after hours 7P -7A, for this patient?  Check the care team in [facility] and look  for a) attending/consulting [facility] provider listed and b) the [facility] team listed. Page or secure chat 7A-7P. Log into [email or web address] and use [place]'s universal password to access. If you do not have the password, please contact the hospital operator. Locate the [facility] provider you are looking for under [facility] and page to a number that you can be directly reached. If you still have difficulty reaching the provider, please page the [facility] (Director on Call) for the Hospitalists listed on amion for assistance.

## 2022-11-30 NOTE — Progress Notes (Signed)
Progress Note  Primary GI: Dr. Mia Creek at [facility]    Subjective  Chief Complaint: Pancreatitis and CBD stone   Patient with family at bedside, wife. Provided some of the history.  Patient lying in bed no acute distress.  States he has the NG tube in but he still eating, feels like he is able to eat more and was holding down some oatmeal.  Also had 4 bowel movements and his stomach feels softer, last one was loose otherwise formed.  No melena or hematochezia.  No nausea or vomiting.  Downtrending leukocytosis 14-13.2 Hemoglobin stable at 11.8  vital stable   Objective   Vital signs in last 24 hours: Temp:  [97.5 F (36.4 C)-98.6 F (37 C)] 97.5 F (36.4 C) (05/28 0747) Pulse Rate:  [72-81] 75 (05/28 0747) Resp:  [16-18] 18 (05/28 0747) BP: (129-157)/(67-80) 135/77 (05/28 0747) SpO2:  [95 %-97 %] 95 % (05/28 0747) Last BM Date : 11/30/22 Last BM recorded by nurses in past 5 days Stool Type: -- (UTA) (11/30/2022  9:00 AM)  General:   male in no acute distress  Heart:  Regular rate and rhythm; no murmurs Pulm: Clear anteriorly; no wheezing Abdomen:  Soft, Non-distended AB, Active bowel sounds. mild tenderness in the entire abdomen. Without guarding and Without rebound, No organomegaly appreciated. Extremities:  without  edema. Neurologic:  Alert and  oriented x4;  No focal deficits.  Psych:  Cooperative. Normal mood and affect.  Intake/Output from previous day: 05/27 0701 - 05/28 0700 In: 514.8 [IV Piggyback:514.8] Out: -  Intake/Output this shift: Total I/O In: 120 [P.O.:120] Out: -   Studies/Results: DG Abd 1 View  Result Date: 11/29/2022 CLINICAL DATA:  NG tube placement. EXAM: ABDOMEN - 1 VIEW COMPARISON:  Nov 19, 2022 FINDINGS: Enteric catheter terminates in the expected location of the gastric antrum. Limited visualization of the abdomen demonstrates nonspecific bowel gas pattern. IMPRESSION: Enteric catheter terminates in the expected location of the gastric  antrum. Electronically Signed   By: Ted Mcalpine M.D.   On: 11/29/2022 15:48   MR ABDOMEN MRCP W WO CONTAST  Result Date: 11/29/2022 CLINICAL DATA:  82 year old male with history of epigastric pain. Suspected choledocholithiasis. EXAM: MRI ABDOMEN WITHOUT AND WITH CONTRAST (INCLUDING MRCP) TECHNIQUE: Multiplanar multisequence MR imaging of the abdomen was performed both before and after the administration of intravenous contrast. Heavily T2-weighted images of the biliary and pancreatic ducts were obtained, and three-dimensional MRCP images were rendered by post processing. CONTRAST:  7mL GADAVIST GADOBUTROL 1 MMOL/ML IV SOLN COMPARISON:  Abdominal MRI 11/22/2022. FINDINGS: Lower chest: Linear areas of increased signal intensity in the lower lobes of the lungs bilaterally, likely reflective of areas of subsegmental atelectasis, but poorly evaluated on today's magnetic resonance examination. Hepatobiliary: No suspicious cystic or solid hepatic lesions. No intrahepatic biliary ductal dilatation noted on MRCP images. Common bile duct measures 7 mm in the porta hepatis which is within normal limits given the patient's advanced age. The more distal common bile duct is markedly narrowed (best appreciated on coronal image 21 of series 3) as it traverses through the pancreatic head, likely secondary to extrinsic mass effect. Small filling defect in the distal common bile duct immediately above the ampulla (coronal image 20 of series 3) measuring 3 mm, again concerning for potential choledocholithiasis. Numerous small filling defects are also noted lying dependently in the gallbladder, compatible with gallstones. Gallbladder is moderately distended. However, the gallbladder wall thickness is normal. No pericholecystic fluid or surrounding inflammatory changes.  Pancreas: Again noted is a poorly defined mildly T2 hyperintense lesion in the head of the pancreas (axial image 27 of series 4) which is currently estimated  to measure approximately 1.3 x 1.2 cm, and is associated with lack of enhancement on post gadolinium imaging (axial image 77 of series 12/04), suggesting an area of pancreatic necrosis. Pre gadolinium T1 weighted images also demonstrate areas of high T1 signal intensity in the inferior aspect of the pancreatic head, as well as the anterior aspect of the pancreatic body and tail, as well as in the surrounding soft tissues, indicative of areas of hemorrhage, along with probable proteinaceous/hemorrhagic debris. These regions also demonstrate associated areas of high T2 signal intensity, with peripheral rim enhancement on post gadolinium imaging, most evident adjacent to the inferior aspect of the pancreatic head extending into the root of the small bowel mesentery, and adjacent to the tail of the pancreas extending toward the splenic hilum, compatible with developing pancreatic pseudocysts. These exert mass effect upon adjacent structures, causing marked narrowing of the distal common bile duct, in addition to substantial narrowing of the superior mesenteric vein and splenoportal confluence (these veins remain patent at this time). Spleen:  Unremarkable. Adrenals/Urinary Tract: Bilateral kidneys and adrenal glands are normal in appearance. No hydroureteronephrosis in the visualized portions of the abdomen. Stomach/Bowel: Unremarkable. Vascular/Lymphatic: Aortic atherosclerosis. No aneurysm identified in the visualized abdominal vasculature. Marked narrowing of the superior mesenteric vein, distal splenic vein and splenoportal confluence secondary to mass effect from surrounding inflammation and developing pancreatic pseudocysts (discussed above). Other:  Trace volume of ascites. Musculoskeletal: No aggressive appearing osseous lesions are noted in the visualized portions of the skeleton. IMPRESSION: 1. Worsening pancreatitis with small area of pancreatic necrosis in the pancreatic head, along with evolving areas of  hemorrhage within the pancreas and surrounding retroperitoneum, and enlarging pancreatic pseudocysts, as detailed above. 2. Pancreatic inflammation exerts mass effect upon the distal common bile duct which is markedly narrowed. In addition, there continues to be a small 3 mm filling defect in the distal common bile duct immediately above the level of the ampulla suspicious for choledocholithiasis. Despite these findings, the common bile duct is within normal limits given the patient's age, and there is no intrahepatic biliary ductal dilatation to suggest clinically significant biliary obstruction at this time. 3. Cholelithiasis without evidence of acute cholecystitis at this time. 4. Aortic atherosclerosis. Electronically Signed   By: Trudie Reed M.D.   On: 11/29/2022 12:47    Lab Results: Recent Labs    11/28/22 0242 11/29/22 0301 11/30/22 0310  WBC 17.4* 14.0* 13.2*  HGB 11.2* 11.6* 11.8*  HCT 34.1* 34.9* 36.0*  PLT 307 319 355   BMET Recent Labs    11/28/22 0242 11/29/22 0301 11/30/22 0310  NA 132* 131* 132*  K 3.7 3.9 4.1  CL 97* 97* 96*  CO2 26 25 24   GLUCOSE 170* 164* 141*  BUN 9 10 12   CREATININE 0.79 0.87 1.03  CALCIUM 7.6* 7.9* 7.5*   LFT Recent Labs    11/30/22 0310  PROT 6.6  ALBUMIN 2.3*  AST 24  ALT 21  ALKPHOS 64  BILITOT 0.9   PT/INR No results for input(s): "LABPROT", "INR" in the last 72 hours.   Scheduled Meds:  enoxaparin (LOVENOX) injection  40 mg Subcutaneous Q24H   feeding supplement  237 mL Oral TID BM   guaiFENesin  600 mg Oral BID   hydrocortisone cream   Topical QID   pantoprazole (PROTONIX) IV  40  mg Intravenous Q24H   polyethylene glycol  17 g Oral TID   senna-docusate  1 tablet Oral QHS   sodium chloride  1 drop Both Eyes QHS   Continuous Infusions:  methocarbamol (ROBAXIN) IV     piperacillin-tazobactam (ZOSYN)  IV 3.375 g (11/30/22 0856)        Impression/Plan:   Gallstone pancreatitis with  choledocholithiasis Unsuccessful ERCP attempt 5/20 due to edema at the ampulla Repeat MRCP shows small stone in CBD and focal complication of pancreatitis with areas of hemorrhage/pseudocyst and necrosis and mass effect on distal CBD WBC downtrending Continue NG/NJ feeding, patient on heart healthy diet, consider n.p.o. with NG feedings but for now we can do full liquid/soft diet Tentative plan for ERCP Thursday however may need transfer to tertiary center if ERCP attempt is unsuccessful. Will need n.p.o. at midnight 5/30 Will need to hold Lovenox day of the procedure Could consider PTBD by IR however no significant dilation of biliary tree. Continue Zosyn  Pancreatitis with hemorrhage/necrosis/pseudocyst with mass effect on distal CBD Continue NG/NG feedings Continue to encourage ambulation Hemoglobin stable for now if any drop in hemoglobin consider CTA to evaluate for pseudoaneurysm and holding of Lovenox  Constipation Continue MiraLAX, adjust as needed Soft abdomen 4 BMs yesterday  Principal Problem:   Acute gallstone pancreatitis Active Problems:   CAD (coronary artery disease)   GERD (gastroesophageal reflux disease)   Hyperlipidemia, mixed   Type 2 diabetes mellitus with peripheral angiopathy (HCC)   Choledocholithiasis    LOS: 13 days   Doree Albee  11/30/2022, 9:19 AM

## 2022-11-30 NOTE — Progress Notes (Signed)
8 Days Post-Op  Subjective: Appears fairly comfortable today.  Denies abdominal pain.  He has an NG tube in place this morning.  Objective: Vital signs in last 24 hours: Temp:  [97.5 F (36.4 C)-98.6 F (37 C)] 97.5 F (36.4 C) (05/28 0747) Pulse Rate:  [72-81] 75 (05/28 0747) Resp:  [16-18] 18 (05/28 0747) BP: (129-157)/(67-80) 135/77 (05/28 0747) SpO2:  [95 %-97 %] 95 % (05/28 0747) Last BM Date : 11/30/22  Intake/Output from previous day: 05/27 0701 - 05/28 0700 In: 514.8 [IV Piggyback:514.8] Out: -  Intake/Output this shift: Total I/O In: 120 [P.O.:120] Out: -   PE: Gen:  Alert, NAD, pleasant Abd: Soft, nontender, nondistended.  NG tube in place with light output  Lab Results:  Recent Labs    11/29/22 0301 11/30/22 0310  WBC 14.0* 13.2*  HGB 11.6* 11.8*  HCT 34.9* 36.0*  PLT 319 355    BMET Recent Labs    11/29/22 0301 11/30/22 0310  NA 131* 132*  K 3.9 4.1  CL 97* 96*  CO2 25 24  GLUCOSE 164* 141*  BUN 10 12  CREATININE 0.87 1.03  CALCIUM 7.9* 7.5*    PT/INR No results for input(s): "LABPROT", "INR" in the last 72 hours.  CMP     Component Value Date/Time   NA 132 (L) 11/30/2022 0310   NA 138 05/12/2013 0759   K 4.1 11/30/2022 0310   K 3.7 05/12/2013 0759   CL 96 (L) 11/30/2022 0310   CL 105 05/12/2013 0759   CO2 24 11/30/2022 0310   CO2 25 05/12/2013 0759   GLUCOSE 141 (H) 11/30/2022 0310   GLUCOSE 199 (H) 05/12/2013 0759   BUN 12 11/30/2022 0310   BUN 18 05/12/2013 0759   CREATININE 1.03 11/30/2022 0310   CREATININE 1.08 05/12/2013 0759   CALCIUM 7.5 (L) 11/30/2022 0310   CALCIUM 8.8 05/12/2013 0759   PROT 6.6 11/30/2022 0310   PROT 8.0 05/12/2013 0759   ALBUMIN 2.3 (L) 11/30/2022 0310   ALBUMIN 4.0 05/12/2013 0759   AST 24 11/30/2022 0310   AST 22 05/12/2013 0759   ALT 21 11/30/2022 0310   ALT 32 05/12/2013 0759   ALKPHOS 64 11/30/2022 0310   ALKPHOS 79 05/12/2013 0759   BILITOT 0.9 11/30/2022 0310   BILITOT 1.0  05/12/2013 0759   GFRNONAA >60 11/30/2022 0310   GFRNONAA >60 05/12/2013 0759   GFRAA >60 12/07/2018 2227   GFRAA >60 05/12/2013 0759   Lipase     Component Value Date/Time   LIPASE 32 11/21/2022 0237    Studies/Results: DG Abd 1 View  Result Date: 11/29/2022 CLINICAL DATA:  NG tube placement. EXAM: ABDOMEN - 1 VIEW COMPARISON:  Nov 19, 2022 FINDINGS: Enteric catheter terminates in the expected location of the gastric antrum. Limited visualization of the abdomen demonstrates nonspecific bowel gas pattern. IMPRESSION: Enteric catheter terminates in the expected location of the gastric antrum. Electronically Signed   By: Ted Mcalpine M.D.   On: 11/29/2022 15:48   MR ABDOMEN MRCP W WO CONTAST  Result Date: 11/29/2022 CLINICAL DATA:  82 year old male with history of epigastric pain. Suspected choledocholithiasis. EXAM: MRI ABDOMEN WITHOUT AND WITH CONTRAST (INCLUDING MRCP) TECHNIQUE: Multiplanar multisequence MR imaging of the abdomen was performed both before and after the administration of intravenous contrast. Heavily T2-weighted images of the biliary and pancreatic ducts were obtained, and three-dimensional MRCP images were rendered by post processing. CONTRAST:  7mL GADAVIST GADOBUTROL 1 MMOL/ML IV SOLN COMPARISON:  Abdominal  MRI 11/22/2022. FINDINGS: Lower chest: Linear areas of increased signal intensity in the lower lobes of the lungs bilaterally, likely reflective of areas of subsegmental atelectasis, but poorly evaluated on today's magnetic resonance examination. Hepatobiliary: No suspicious cystic or solid hepatic lesions. No intrahepatic biliary ductal dilatation noted on MRCP images. Common bile duct measures 7 mm in the porta hepatis which is within normal limits given the patient's advanced age. The more distal common bile duct is markedly narrowed (best appreciated on coronal image 21 of series 3) as it traverses through the pancreatic head, likely secondary to extrinsic mass  effect. Small filling defect in the distal common bile duct immediately above the ampulla (coronal image 20 of series 3) measuring 3 mm, again concerning for potential choledocholithiasis. Numerous small filling defects are also noted lying dependently in the gallbladder, compatible with gallstones. Gallbladder is moderately distended. However, the gallbladder wall thickness is normal. No pericholecystic fluid or surrounding inflammatory changes. Pancreas: Again noted is a poorly defined mildly T2 hyperintense lesion in the head of the pancreas (axial image 27 of series 4) which is currently estimated to measure approximately 1.3 x 1.2 cm, and is associated with lack of enhancement on post gadolinium imaging (axial image 77 of series 12/04), suggesting an area of pancreatic necrosis. Pre gadolinium T1 weighted images also demonstrate areas of high T1 signal intensity in the inferior aspect of the pancreatic head, as well as the anterior aspect of the pancreatic body and tail, as well as in the surrounding soft tissues, indicative of areas of hemorrhage, along with probable proteinaceous/hemorrhagic debris. These regions also demonstrate associated areas of high T2 signal intensity, with peripheral rim enhancement on post gadolinium imaging, most evident adjacent to the inferior aspect of the pancreatic head extending into the root of the small bowel mesentery, and adjacent to the tail of the pancreas extending toward the splenic hilum, compatible with developing pancreatic pseudocysts. These exert mass effect upon adjacent structures, causing marked narrowing of the distal common bile duct, in addition to substantial narrowing of the superior mesenteric vein and splenoportal confluence (these veins remain patent at this time). Spleen:  Unremarkable. Adrenals/Urinary Tract: Bilateral kidneys and adrenal glands are normal in appearance. No hydroureteronephrosis in the visualized portions of the abdomen. Stomach/Bowel:  Unremarkable. Vascular/Lymphatic: Aortic atherosclerosis. No aneurysm identified in the visualized abdominal vasculature. Marked narrowing of the superior mesenteric vein, distal splenic vein and splenoportal confluence secondary to mass effect from surrounding inflammation and developing pancreatic pseudocysts (discussed above). Other:  Trace volume of ascites. Musculoskeletal: No aggressive appearing osseous lesions are noted in the visualized portions of the skeleton. IMPRESSION: 1. 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 2. Pancreatic inflammation exerts mass effect upon the distal common bile duct which is markedly narrowed. In addition, there continues to be a small 3 mm filling defect in the distal common bile duct immediately above the level of the ampulla suspicious for choledocholithiasis. Despite these findings, the common bile duct is within normal limits given the patient's age, and there is no intrahepatic biliary ductal dilatation to suggest clinically significant biliary obstruction at this time. 3. Cholelithiasis without evidence of acute cholecystitis at this time. 4. Aortic atherosclerosis. Electronically Signed   By: Trudie Reed M.D.   On: 11/29/2022 12:47    Anti-infectives: Anti-infectives (From admission, onward)    Start  Dose/Rate Route Frequency Ordered Stop   11/25/22 1400  piperacillin-tazobactam (ZOSYN) IVPB 3.375 g        3.375 g 12.5 mL/hr over 240 Minutes Intravenous Every 8 hours 11/25/22 1136     11/18/22 0600  piperacillin-tazobactam (ZOSYN) IVPB 3.375 g  Status:  Discontinued        3.375 g 12.5 mL/hr over 240 Minutes Intravenous Every 8 hours 11/17/22 1753 11/24/22 0949   11/17/22 1845  piperacillin-tazobactam (ZOSYN) IVPB 3.375 g        3.375 g 100 mL/hr over 30  Minutes Intravenous  Once 11/17/22 1753 11/17/22 2012        Assessment/Plan Gallstone pancreatitis  Choledocholithiasis with elevated LFT's - GI following for Choledocholithiasis. ERCP attempted 5/20 but aborted - MRCP 5/21 with choledocholithiasis and concern for possible developing pseudocyst  - Continue medical management of Pancreatitis.  - GI planning repeat ERCP, likely Thursday - No evidence of Cholecystitis on [place], CT or MRCP. Does not need abx from our standpoint - We will follow peripherally but no plans for lap chole prior to ERCP presently, also may need delayed lap chole if seems to be developing more complicated pancreatitis.  We will check back with patient after ERCP on Thursday.   FEN - Low fat diet , IVF per [facility] VTE - SCDs, LMWH  ID - Zosyn per [facility] (see above)  I reviewed Consultant (GI) notes, hospitalist notes, last 24 h vitals and pain scores, last 48 h intake and output, and last 24 h labs and trends.   LOS: 13 days    [name] ,MD [facility] 11/30/2022, 10:17 AM Please see Amion for pager number during day hours 7:00am-4:30pm

## 2022-11-30 NOTE — Progress Notes (Signed)
Initial Nutrition Assessment  DOCUMENTATION CODES:  Not applicable  INTERVENTION:  Tube feeds via NG-tube: Start Osmolite 1.5 at 20 mL/hr, advance by 10 mL q8h to goal rate of 55 mL/hr (1320 mL per day) 60 mL ProSource TF20 - Daily Free water flush: 150 mL q4h Tube feeds at goal provides 2060 kcal, 103 gm protein, and 1906 mL total free water daily.  Monitor magnesium, potassium, and phosphorus BID for at least 3 days, MD to replete as needed, as pt is at risk for refeeding syndrome given ongoing poor PO intake  Discontinue Ensure Enlive, replace with Boost Breeze po TID, each supplement provides 250 kcal and 9 grams of protein Recommend obtaining new weight.   NUTRITION DIAGNOSIS:  Inadequate oral intake related to acute illness as evidenced by per patient/family report.  GOAL:  Patient will meet greater than or equal to 90% of their needs  MONITOR:  PO intake, Supplement acceptance, Labs, TF tolerance  REASON FOR ASSESSMENT:   Consult Enteral/tube feeding initiation and management  ASSESSMENT:   82 y.o. male presented to [facility] ED with abdominal pain, nausea, and vomiting for 3 days. PMH includes GERD, HLD, T2DM, and CAD. Pt admitted with acute gallstone pancreatitis.   5/15 - transferred to [facility]; Admitted 5/18 - diet advanced to full liquids 5/20 - NPO; s/p ERCP (unsuccessful); diet advanced to clear liquids 5/21 - diet advanced to heart healthy 5/27 - NG-tube placed (tip gastric)  Pt laying in bed, wife at bedside. Reports that pt has not been eating very much this admission due to ongoing pain. Has been sticking with manly clear liquids, but was able to eat oatmeal this morning and tolerated well. Denies any nausea or vomiting at this time. Discussed the use of NG tube to assist in meeting needs, pt would prefer to meet nutritional needs per tube until better. RD encouraged pt to continue to eat as well. Pt with Ensure currently ordered, reports that he is drinking about 1/4  of it; was agreeable to trying [facility] and thinks that he would like that better. Reports that he had a good bowel movement this morning.  Pt reports a UBW of 168#, thinks that he has lost some weight since being here. Pt up and walking multiple times per day.  Rd asked RN to obtain new weight due to no updated weight since admission.   Per notes, plan for repeat ERCP on Thursday.   Medications reviewed and include: Protonix, Miralax, Senokot-S, IV antibiotics  Labs reviewed: Sodium 132, Potassium 4.1, Hgb A1c 5.8   NUTRITION - FOCUSED PHYSICAL EXAM: Deferred to follow-up.   Diet Order:   Diet Order             Diet Heart Room service appropriate? Yes; Fluid consistency: Thin  Diet effective now                  EDUCATION NEEDS:   Education needs have been addressed  Skin:  Skin Assessment: Reviewed RN Assessment  Last BM:  5/28  Height:  Ht Readings from Last 1 Encounters:  11/22/22 5\' 8"  (1.727 m)   Weight:  Wt Readings from Last 1 Encounters:  11/22/22 76.2 kg   Ideal Body Weight:  70 kg  BMI:  Body mass index is 25.54 kg/m.  Estimated Nutritional Needs:  Kcal:  1900-2100 Protein:  95-115 grams Fluid:  >/= 1.9 L   Kirby Crigler RD, LDN Clinical Dietitian See [facility] for contact information.

## 2022-12-01 DIAGNOSIS — K851 Biliary acute pancreatitis without necrosis or infection: Secondary | ICD-10-CM | POA: Diagnosis not present

## 2022-12-01 DIAGNOSIS — K5900 Constipation, unspecified: Secondary | ICD-10-CM | POA: Diagnosis not present

## 2022-12-01 DIAGNOSIS — K8050 Calculus of bile duct without cholangitis or cholecystitis without obstruction: Secondary | ICD-10-CM | POA: Diagnosis not present

## 2022-12-01 DIAGNOSIS — K219 Gastro-esophageal reflux disease without esophagitis: Secondary | ICD-10-CM | POA: Diagnosis not present

## 2022-12-01 LAB — GLUCOSE, CAPILLARY
Glucose-Capillary: 151 mg/dL — ABNORMAL HIGH (ref 70–99)
Glucose-Capillary: 159 mg/dL — ABNORMAL HIGH (ref 70–99)
Glucose-Capillary: 175 mg/dL — ABNORMAL HIGH (ref 70–99)
Glucose-Capillary: 178 mg/dL — ABNORMAL HIGH (ref 70–99)
Glucose-Capillary: 236 mg/dL — ABNORMAL HIGH (ref 70–99)
Glucose-Capillary: 278 mg/dL — ABNORMAL HIGH (ref 70–99)

## 2022-12-01 LAB — CBC WITH DIFFERENTIAL/PLATELET
Abs Immature Granulocytes: 0.06 10*3/uL (ref 0.00–0.07)
Basophils Absolute: 0.1 10*3/uL (ref 0.0–0.1)
Basophils Relative: 1 %
Eosinophils Absolute: 0.2 10*3/uL (ref 0.0–0.5)
Eosinophils Relative: 2 %
HCT: 36.2 % — ABNORMAL LOW (ref 39.0–52.0)
Hemoglobin: 12 g/dL — ABNORMAL LOW (ref 13.0–17.0)
Immature Granulocytes: 1 %
Lymphocytes Relative: 28 %
Lymphs Abs: 3.3 10*3/uL (ref 0.7–4.0)
MCH: 28.6 pg (ref 26.0–34.0)
MCHC: 33.1 g/dL (ref 30.0–36.0)
MCV: 86.4 fL (ref 80.0–100.0)
Monocytes Absolute: 1.1 10*3/uL — ABNORMAL HIGH (ref 0.1–1.0)
Monocytes Relative: 10 %
Neutro Abs: 7.1 10*3/uL (ref 1.7–7.7)
Neutrophils Relative %: 58 %
Platelets: 358 10*3/uL (ref 150–400)
RBC: 4.19 MIL/uL — ABNORMAL LOW (ref 4.22–5.81)
RDW: 14.6 % (ref 11.5–15.5)
WBC: 11.9 10*3/uL — ABNORMAL HIGH (ref 4.0–10.5)
nRBC: 0 % (ref 0.0–0.2)

## 2022-12-01 LAB — COMPREHENSIVE METABOLIC PANEL
ALT: 21 U/L (ref 0–44)
AST: 23 U/L (ref 15–41)
Albumin: 2.2 g/dL — ABNORMAL LOW (ref 3.5–5.0)
Alkaline Phosphatase: 58 U/L (ref 38–126)
Anion gap: 11 (ref 5–15)
BUN: 15 mg/dL (ref 8–23)
CO2: 24 mmol/L (ref 22–32)
Calcium: 8 mg/dL — ABNORMAL LOW (ref 8.9–10.3)
Chloride: 96 mmol/L — ABNORMAL LOW (ref 98–111)
Creatinine, Ser: 0.91 mg/dL (ref 0.61–1.24)
GFR, Estimated: >60 mL/min (ref >60)
Glucose, Bld: 253 mg/dL — ABNORMAL HIGH (ref 70–99)
Potassium: 3.8 mmol/L (ref 3.5–5.1)
Sodium: 131 mmol/L — ABNORMAL LOW (ref 135–145)
Total Bilirubin: 0.8 mg/dL (ref 0.3–1.2)
Total Protein: 6.4 g/dL — ABNORMAL LOW (ref 6.5–8.1)

## 2022-12-01 LAB — MAGNESIUM
Magnesium: 2 mg/dL (ref 1.7–2.4)
Magnesium: 2 mg/dL (ref 1.7–2.4)

## 2022-12-01 LAB — PHOSPHORUS
Phosphorus: 3.2 mg/dL (ref 2.5–4.6)
Phosphorus: 2.6 mg/dL (ref 2.5–4.6)

## 2022-12-01 MED ORDER — INSULIN ASPART 100 UNIT/ML IJ SOLN
0.0000 [IU] | INTRAMUSCULAR | Status: DC
  Administered 2022-12-01 (×4): 2 [IU] via SUBCUTANEOUS
  Administered 2022-12-01: 5 [IU] via SUBCUTANEOUS
  Administered 2022-12-02: 3 [IU] via SUBCUTANEOUS
  Administered 2022-12-02: 1 [IU] via SUBCUTANEOUS
  Administered 2022-12-02: 3 [IU] via SUBCUTANEOUS
  Administered 2022-12-03 (×2): 2 [IU] via SUBCUTANEOUS
  Administered 2022-12-03: 1 [IU] via SUBCUTANEOUS
  Administered 2022-12-03 (×2): 2 [IU] via SUBCUTANEOUS
  Administered 2022-12-04: 5 [IU] via SUBCUTANEOUS
  Administered 2022-12-04 (×3): 2 [IU] via SUBCUTANEOUS
  Administered 2022-12-04: 1 [IU] via SUBCUTANEOUS
  Administered 2022-12-04: 2 [IU] via SUBCUTANEOUS
  Administered 2022-12-05 (×2): 3 [IU] via SUBCUTANEOUS
  Administered 2022-12-05 (×2): 2 [IU] via SUBCUTANEOUS
  Administered 2022-12-05 – 2022-12-06 (×2): 3 [IU] via SUBCUTANEOUS
  Administered 2022-12-06: 2 [IU] via SUBCUTANEOUS
  Administered 2022-12-06: 5 [IU] via SUBCUTANEOUS
  Administered 2022-12-06: 3 [IU] via SUBCUTANEOUS
  Administered 2022-12-06: 2 [IU] via SUBCUTANEOUS
  Administered 2022-12-06: 3 [IU] via SUBCUTANEOUS
  Administered 2022-12-07 (×2): 2 [IU] via SUBCUTANEOUS
  Administered 2022-12-07 (×2): 3 [IU] via SUBCUTANEOUS
  Administered 2022-12-07 (×2): 2 [IU] via SUBCUTANEOUS
  Administered 2022-12-07: 3 [IU] via SUBCUTANEOUS
  Administered 2022-12-08: 1 [IU] via SUBCUTANEOUS
  Administered 2022-12-08: 3 [IU] via SUBCUTANEOUS

## 2022-12-01 MED ORDER — POLYETHYLENE GLYCOL 3350 17 G PO PACK: 17.0000 g | PACK | Freq: Every day | ORAL | Status: DC | PRN

## 2022-12-01 NOTE — H&P (View-Only) (Signed)
Progress Note  Primary GI: Dr. Mia Creek at [facility]    Subjective  Chief Complaint: Pancreatitis and CBD stone   Patient with family at bedside, sister and friend. Provided some of the history.  Patient sitting in chair next to bed. Appears to be much better spirits today, much more alert. Patient states he is doing very well, denies nausea, vomiting. Has had 2 more loose bowel movements 1 this morning with fecal incontinence, other 1 [place] today we did make the bathroom.  Denies hematochezia.  Downtrending leukocytosis 14-13.2-->11.9 Hemoglobin stable at 11.8--12 vital stable   Objective   Vital signs in last 24 hours: Temp:  [97.8 F (36.6 C)-98.6 F (37 C)] 98.2 F (36.8 C) (05/29 0756) Pulse Rate:  [69-79] 75 (05/29 0756) Resp:  [16-17] 16 (05/29 0756) BP: (114-131)/(58-72) 118/58 (05/29 0756) SpO2:  [94 %-97 %] 97 % (05/29 0756) Last BM Date : 11/30/22 Last BM recorded by nurses in past 5 days Stool Type: Type 7 (Liquid consistency with no solid pieces) (per pt report stool was watery and loose) (11/30/2022  2:30 PM)  General:   male in no acute distress  Heart:  Regular rate and rhythm; no murmurs Pulm: Clear anteriorly; no wheezing Abdomen:  Soft, Non-distended AB, Active bowel sounds. mild tenderness in the entire abdomen. Without guarding and Without rebound, No organomegaly appreciated. Extremities:  without  edema. Neurologic:  Alert and  oriented x4;  No focal deficits.  Psych:  Cooperative. Normal mood and affect.  Intake/Output from previous day: 05/28 0701 - 05/29 0700 In: 1293 [P.O.:480; NG/GT:600; IV Piggyback:213] Out: 332 [Emesis/NG output:332] Intake/Output this shift: No intake/output data recorded.  Studies/Results: DG Abd 1 View  Result Date: 11/29/2022 CLINICAL DATA:  NG tube placement. EXAM: ABDOMEN - 1 VIEW COMPARISON:  Nov 19, 2022 FINDINGS: Enteric catheter terminates in the expected location of the gastric antrum. Limited visualization of  the abdomen demonstrates nonspecific bowel gas pattern. IMPRESSION: Enteric catheter terminates in the expected location of the gastric antrum. Electronically Signed   By: Ted Mcalpine M.D.   On: 11/29/2022 15:48    Lab Results: Recent Labs    11/29/22 0301 11/30/22 0310 12/01/22 0529  WBC 14.0* 13.2* 11.9*  HGB 11.6* 11.8* 12.0*  HCT 34.9* 36.0* 36.2*  PLT 319 355 358   BMET Recent Labs    11/29/22 0301 11/30/22 0310 12/01/22 0529  NA 131* 132* 131*  K 3.9 4.1 3.8  CL 97* 96* 96*  CO2 25 24 24   GLUCOSE 164* 141* 253*  BUN 10 12 15   CREATININE 0.87 1.03 0.91  CALCIUM 7.9* 7.5* 8.0*   LFT Recent Labs    12/01/22 0529  PROT 6.4*  ALBUMIN 2.2*  AST 23  ALT 21  ALKPHOS 58  BILITOT 0.8   PT/INR No results for input(s): "LABPROT", "INR" in the last 72 hours.   Scheduled Meds:  enoxaparin (LOVENOX) injection  40 mg Subcutaneous Q24H   feeding supplement  1 Container Oral TID BM   feeding supplement (PROSource TF20)  60 mL Per Tube Daily   free water  150 mL Per Tube Q4H   guaiFENesin  600 mg Oral BID   hydrocortisone cream   Topical QID   insulin aspart  0-9 Units Subcutaneous Q4H   pantoprazole (PROTONIX) IV  40 mg Intravenous Q24H   polyethylene glycol  17 g Oral TID   senna-docusate  1 tablet Oral QHS   sodium chloride  1 drop Both Eyes QHS  Continuous Infusions:  feeding supplement (OSMOLITE 1.5 CAL) 40 mL (12/01/22 1610)   methocarbamol (ROBAXIN) IV     piperacillin-tazobactam (ZOSYN)  IV 3.375 g (12/01/22 0827)        Impression/Plan:   Gallstone pancreatitis with choledocholithiasis Unsuccessful ERCP attempt 5/20 due to edema at the ampulla Repeat MRCP shows small stone in CBD and focal complication of pancreatitis with areas of hemorrhage/pseudocyst and necrosis and mass effect on distal CBD WBC downtrending Continue NG/NJ feeding, patient on heart healthy diet, consider n.p.o. with NG feedings but for now we can do full liquid/soft  diet Tentative plan for ERCP Thursday however may need transfer to tertiary center if ERCP attempt is unsuccessful versus IR consult Will need n.p.o. at midnight 5/30, hold tube feedings Lovenox discontinued tonight Continue Zosyn  Pancreatitis with hemorrhage/necrosis/pseudocyst with mass effect on distal CBD Continue NG/NG feedings, hold tube feedings. at midnight Continue to encourage ambulation Hemoglobin stable for now if any drop in hemoglobin consider CTA to evaluate for pseudoaneurysm  Constipation MiraLAX as needed Soft abdomen 4 bowel movements yesterday, 2 bowel movements today  Principal Problem:   Acute gallstone pancreatitis Active Problems:   CAD (coronary artery disease)   GERD (gastroesophageal reflux disease)   Hyperlipidemia, mixed   Type 2 diabetes mellitus with peripheral angiopathy (HCC)   Choledocholithiasis   Necrotizing pancreatitis   Hemorrhagic pancreatitis   History of ERCP   Biliary obstruction   Moderate protein-calorie malnutrition (HCC)   Gallstone pancreatitis    LOS: 14 days   Jon Bray  12/01/2022, 2:04 PM

## 2022-12-01 NOTE — Progress Notes (Signed)
PROGRESS NOTE    TAYYAB RAPP  WUJ:[id number] DOB: 1940/08/13 DOA: 11/17/2022 PCP: Danella Penton, MD   Brief Narrative:  CURREN DELON is a 82 y.o. male with PMH significant for DM2, HTN, HLD, CAD/NSTEMI/stent 2014, paroxysmal A-fib 5/14, patient was brought to the ED at [facility] with complaint of worsening nausea, vomiting.   In March, patient saw his primary care provider for loss of appetite, weight loss. Workup showed elevated liver enzymes and bilirubin.  3/27 CT abdomen and pelvis showed focal wall thickening in the urinary bladder adjacent to right UVJ suspicious for neoplasm, also extensive sigmoid diverticulosis 3/28, MRI/MRCP showed choledocholithiasis with 2 tiny gallstones near the ampulla measuring 0.2 to 0.3 cm and mild intra and extrahepatic biliary duct dilatation, CBD dilated to 0.9 cm Patient refused to go to ED for urgent evaluation.  PCP sent referral to general surgery and GI 4/9, seen by surgeon Dr. Trisha Mangle.  Cholecystectomy was recommended but patient wanted to wait out.   5/14, patient was brought to the ED for 3 days of nausea, vomiting. Initial workup showed lipase elevated over 10,000, LFTs elevated as well MRI abdomen showed acute interstitial pancreatitis, choledocholithiasis with 4 mm stone in distal CBD. He was started on conservative management with IV fluid, IV analgesia, IV antiemetics, bowel rest Admitted to [facility] GI and general surgery were consulted. 5/20, ERCP was attempted but had to be aborted due to inability to identify the papula.  Follow-up MRCP showed persistent small stone in the CBD, changes of pancreatitis along with small fluid collection. Overall, liver enzymes continue to improve, Currently, patient is tentatively planned for ERCP by Dr. Meridee Score next week.  Assessment & Plan:   Principal Problem:   Acute gallstone pancreatitis Active Problems:   CAD (coronary artery disease)   GERD (gastroesophageal reflux disease)   Hyperlipidemia,  mixed   Type 2 diabetes mellitus with peripheral angiopathy (HCC)   Choledocholithiasis   Necrotizing pancreatitis   Hemorrhagic pancreatitis   History of ERCP   Biliary obstruction   Moderate protein-calorie malnutrition (HCC)   Gallstone pancreatitis  Acute gallstone pancreatitis / Choledocholithiasis / Persistent leukocytosis Timeline of events as above. GI and general surgery following Pancreatitis seems to have improved.  Currently able to tolerate regular consistency diet, LFTs normalized and bilirubin fairly stable.  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  Pancreatic inflammation exerts mass effect on distal CBD, there continues to be 3 mm filling defect in distal CBD suspicious for choledocholithiasis.  Plan for ERCP by Dr. Meridee Score tomorrow.  Patient has been started on NG tube feedings since 11/30/2022.  Remains on antibiotics per GI and general surgery recommendations.  Bibasilar atelectasis Persistent bibasilar atelectasis noted in chest x-ray done on 5/18 and repeated 5/24.  But he is asymptomatic and not hypoxic.  Incentive spirometry encouraged.  Hyponatremia: Resolved.  CAD/NSTEMI/stent 2014 HLD No anginal symptoms currently. PTA on aspirin 81 mg daily.  Currently on hold Not on statin as an outpatient   paroxysmal A-fib  Was on aspirin daily which is currently on hold. Not on anticoagulation as an outpatient   Type 2 diabetes mellitus Diet controlled.  Not on meds, A1c 5.8 on 11/19/2022   GERD PPI   Bladder wall thickening 327, CT abdomen and pelvis showed focal wall thickening in the bladder adjacent to the right UVJ suspicious for neoplasm.  Radiologist recommended cystoscopy for further evaluation. Urinalysis  11/16/2022 did not show any evidence of hematuria. Patient/family have not noticed any hematuria thereafter  either. 5/22 I sent an epic message to on-call urology Dr. McDiarmid, recommended outpatient follow-up for further workup including cystoscopy.[name]     Extensive sigmoid diverticulosis  Chronic constipation Bowel regimen   Rash on the back. Possible associated with newly linen material. On Hydrocortisone cream.  No complaints today.  DVT prophylaxis: enoxaparin (LOVENOX) injection 40 mg Start: 11/23/22 1000 SCDs Start: 11/17/22 1732   Code Status: Full Code  Family Communication: Wife present at bedside.  Plan of care discussed with patient in length and he/she verbalized understanding and agreed with it.  Status is: Inpatient Remains inpatient appropriate because: Plan for ERCP tomorrow.   Estimated body mass index is 25.54 kg/m as calculated from the following:   Height as of this encounter: 5\' 8"  (1.727 m).   Weight as of this encounter: 76.2 kg.    Nutritional Assessment: Body mass index is 25.54 kg/m.[name] Seen by dietician.  I agree with the assessment and plan as outlined below: Nutrition Status: Nutrition Problem: Inadequate oral intake Etiology: acute illness Signs/Symptoms: per patient/family report Interventions: Boost Breeze, Tube feeding  . Skin Assessment: I have examined the patient's skin and I agree with the wound assessment as performed by the wound care RN as outlined below:    Consultants:  GI and general surgery  Procedures:  As above  Antimicrobials:  Anti-infectives (From admission, onward)    Start     Dose/Rate Route Frequency Ordered Stop   11/25/22 1400  piperacillin-tazobactam (ZOSYN) IVPB 3.375 g        3.375 g 12.5 mL/hr over 240 Minutes Intravenous Every 8 hours 11/25/22 1136     11/18/22 0600  piperacillin-tazobactam (ZOSYN) IVPB 3.375 g  Status:  Discontinued        3.375 g 12.5 mL/hr over 240 Minutes Intravenous Every 8 hours 11/17/22 1753 11/24/22 0949   11/17/22 1845  piperacillin-tazobactam (ZOSYN) IVPB 3.375 g        3.375  g 100 mL/hr over 30 Minutes Intravenous  Once 11/17/22 1753 11/17/22 2012         Subjective: Patient seen and examined.  Wife at the bedside.  No abdominal pain but feels weak.  Objective: Vitals:   11/30/22 1610 11/30/22 2043 12/01/22 0428 12/01/22 0756  BP: 125/69 114/64 131/72 (!) 118/58  Pulse: 69 79 77 75  Resp: 17 16 17 16   Temp: 97.8 F (36.6 C) 98.6 F (37 C) 98 F (36.7 C) 98.2 F (36.8 C)  TempSrc: Oral Oral Oral Oral  SpO2: 94% 97% 95% 97%  Weight:      Height:        Intake/Output Summary (Last 24 hours) at 12/01/2022 0917 Last data filed at 12/01/2022 0422 Gross per 24 hour  Intake 1172.96 ml  Output 332 ml  Net 840.96 ml    Filed Weights   11/17/22 1833 11/22/22 1351  Weight: 76.2 kg 76.2 kg    Examination:  General exam: Appears calm and comfortable  Respiratory system: Clear to auscultation. Respiratory effort normal. Cardiovascular system: S1 & S2 heard, RRR. No JVD, murmurs, rubs, gallops or clicks. No pedal edema. Gastrointestinal system: Abdomen is nondistended, soft and very mild periumbilical tenderness. No organomegaly or masses felt. Normal bowel sounds heard. Central nervous system: Alert and oriented. No focal neurological deficits. Extremities: Symmetric 5 x 5 power. Skin: No rashes, lesions or ulcers.  Psychiatry: Judgement and insight appear normal. Mood &  affect appropriate.   Data Reviewed: I have personally reviewed following labs and imaging studies  CBC: Recent Labs  Lab 11/27/22 0426 11/28/22 0242 11/29/22 0301 11/30/22 0310 12/01/22 0529  WBC 19.0* 17.4* 14.0* 13.2* 11.9*  NEUTROABS 14.3* 12.7* 9.6* 8.0* 7.1  HGB 11.5* 11.2* 11.6* 11.8* 12.0*  HCT 34.5* 34.1* 34.9* 36.0* 36.2*  MCV 88.7 87.9 85.3 88.5 86.4  PLT 297 307 319 355 358    Basic Metabolic Panel: Recent Labs  Lab 11/27/22 0426 11/28/22 0242 11/29/22 0301 11/30/22 0310 11/30/22 1639 12/01/22 0529  NA 135 132* 131* 132*  --  131*  K 3.9 3.7 3.9  4.1  --  3.8  CL 97* 97* 97* 96*  --  96*  CO2 29 26 25 24   --  24  GLUCOSE 164* 170* 164* 141*  --  253*  BUN 10 9 10 12   --  15  CREATININE 0.93 0.79 0.87 1.03  --  0.91  CALCIUM 7.9* 7.6* 7.9* 7.5*  --  8.0*  MG  --   --   --  2.0 2.1 2.0  PHOS  --   --   --  3.4 3.1 3.2    GFR: Estimated Creatinine Clearance: 59.5 mL/min (by C-G formula based on SCr of 0.91 mg/dL). Liver Function Tests: Recent Labs  Lab 11/27/22 0426 11/28/22 0242 11/29/22 0301 11/30/22 0310 12/01/22 0529  AST 21 20 20 24 23   ALT 21 20 21 21 21   ALKPHOS 56 63 60 64 58  BILITOT 1.3* 1.3* 1.3* 0.9 0.8  PROT 5.6* 6.2* 6.4* 6.6 6.4*  ALBUMIN 2.0* 2.1* 2.2* 2.3* 2.2*    No results for input(s): "LIPASE", "AMYLASE" in the last 168 hours.  No results for input(s): "AMMONIA" in the last 168 hours. Coagulation Profile: No results for input(s): "INR", "PROTIME" in the last 168 hours. Cardiac Enzymes: No results for input(s): "CKTOTAL", "CKMB", "CKMBINDEX", "TROPONINI" in the last 168 hours. BNP (last 3 results) No results for input(s): "PROBNP" in the last 8760 hours. HbA1C: No results for input(s): "HGBA1C" in the last 72 hours. CBG: Recent Labs  Lab 11/30/22 1632 11/30/22 2038 11/30/22 2349 12/01/22 0425 12/01/22 0802  GLUCAP 173* 222* 226* 236* 278*   Lipid Profile: No results for input(s): "CHOL", "HDL", "LDLCALC", "TRIG", "CHOLHDL", "LDLDIRECT" in the last 72 hours. Thyroid Function Tests: No results for input(s): "TSH", "T4TOTAL", "FREET4", "T3FREE", "THYROIDAB" in the last 72 hours. Anemia Panel: No results for input(s): "VITAMINB12", "FOLATE", "FERRITIN", "TIBC", "IRON", "RETICCTPCT" in the last 72 hours. Sepsis Labs: No results for input(s): "PROCALCITON", "LATICACIDVEN" in the last 168 hours.  No results found for this or any previous visit (from the past 240 hour(s)).   Radiology Studies: DG Abd 1 View  Result Date: 11/29/2022 CLINICAL DATA:  NG tube placement. EXAM: ABDOMEN - 1  VIEW COMPARISON:  Nov 19, 2022 FINDINGS: Enteric catheter terminates in the expected location of the gastric antrum. Limited visualization of the abdomen demonstrates nonspecific bowel gas pattern. IMPRESSION: Enteric catheter terminates in the expected location of the gastric antrum. Electronically Signed   By: Ted Mcalpine M.D.   On: 11/29/2022 15:48    Scheduled Meds:  enoxaparin (LOVENOX) injection  40 mg Subcutaneous Q24H   feeding supplement  1 Container Oral TID BM   feeding supplement (PROSource TF20)  60 mL Per Tube Daily   free water  150 mL Per Tube Q4H   guaiFENesin  600 mg Oral BID   hydrocortisone cream   Topical  QID   insulin aspart  0-9 Units Subcutaneous Q4H   pantoprazole (PROTONIX) IV  40 mg Intravenous Q24H   polyethylene glycol  17 g Oral TID   senna-docusate  1 tablet Oral QHS   sodium chloride  1 drop Both Eyes QHS   Continuous Infusions:  feeding supplement (OSMOLITE 1.5 CAL) 40 mL (12/01/22 4010)   methocarbamol (ROBAXIN) IV     piperacillin-tazobactam (ZOSYN)  IV 3.375 g (12/01/22 0827)     LOS: 14 days   Hughie Closs, MD [facility]  12/01/2022, 9:17 AM   *Please note that this is a verbal dictation therefore any spelling or grammatical errors are due to the "Dragon Medical One" system interpretation.  Please page via Amion and do not message via secure chat for urgent patient care matters. Secure chat can be used for non urgent patient care matters.  How to contact the [facility] Attending or Consulting provider 7A - 7P or covering provider during after hours 7P -7A, for this patient?  Check the care team in [facility] and look for a) attending/consulting [facility] provider listed and b) the [facility] team listed. Page or secure chat 7A-7P. Log into [email or web address] and use [place]'s universal password to access. If you do not have the password, please contact the hospital operator. Locate the [facility] provider you are looking for under [facility] and page to a  number that you can be directly reached. If you still have difficulty reaching the provider, please page the [facility] (Director on Call) for the Hospitalists listed on amion for assistance.

## 2022-12-01 NOTE — Progress Notes (Signed)
  Progress Note  Primary GI: Dr. Locklear at [facility]    Subjective  Chief Complaint: Pancreatitis and CBD stone   Patient with family at bedside, sister and friend. Provided some of the history.  Patient sitting in chair next to bed. Appears to be much better spirits today, much more alert. Patient states he is doing very well, denies nausea, vomiting. Has had 2 more loose bowel movements 1 this morning with fecal incontinence, other 1 Rachel today we did make the bathroom.  Denies hematochezia.  Downtrending leukocytosis 14-13.2-->11.9 Hemoglobin stable at 11.8--12 vital stable   Objective   Vital signs in last 24 hours: Temp:  [97.8 F (36.6 C)-98.6 F (37 C)] 98.2 F (36.8 C) (05/29 0756) Pulse Rate:  [69-79] 75 (05/29 0756) Resp:  [16-17] 16 (05/29 0756) BP: (114-131)/(58-72) 118/58 (05/29 0756) SpO2:  [94 %-97 %] 97 % (05/29 0756) Last BM Date : 11/30/22 Last BM recorded by nurses in past 5 days Stool Type: Type 7 (Liquid consistency with no solid pieces) (per pt report stool was watery and loose) (11/30/2022  2:30 PM)  General:   male in no acute distress  Heart:  Regular rate and rhythm; no murmurs Pulm: Clear anteriorly; no wheezing Abdomen:  Soft, Non-distended AB, Active bowel sounds. mild tenderness in the entire abdomen. Without guarding and Without rebound, No organomegaly appreciated. Extremities:  without  edema. Neurologic:  Alert and  oriented x4;  No focal deficits.  Psych:  Cooperative. Normal mood and affect.  Intake/Output from previous day: 05/28 0701 - 05/29 0700 In: 1293 [P.O.:480; NG/GT:600; IV Piggyback:213] Out: 332 [Emesis/NG output:332] Intake/Output this shift: No intake/output data recorded.  Studies/Results: DG Abd 1 View  Result Date: 11/29/2022 CLINICAL DATA:  NG tube placement. EXAM: ABDOMEN - 1 VIEW COMPARISON:  Nov 19, 2022 FINDINGS: Enteric catheter terminates in the expected location of the gastric antrum. Limited visualization of  the abdomen demonstrates nonspecific bowel gas pattern. IMPRESSION: Enteric catheter terminates in the expected location of the gastric antrum. Electronically Signed   By: Dobrinka  Dimitrova M.D.   On: 11/29/2022 15:48    Lab Results: Recent Labs    11/29/22 0301 11/30/22 0310 12/01/22 0529  WBC 14.0* 13.2* 11.9*  HGB 11.6* 11.8* 12.0*  HCT 34.9* 36.0* 36.2*  PLT 319 355 358   BMET Recent Labs    11/29/22 0301 11/30/22 0310 12/01/22 0529  NA 131* 132* 131*  K 3.9 4.1 3.8  CL 97* 96* 96*  CO2 25 24 24  GLUCOSE 164* 141* 253*  BUN 10 12 15  CREATININE 0.87 1.03 0.91  CALCIUM 7.9* 7.5* 8.0*   LFT Recent Labs    12/01/22 0529  PROT 6.4*  ALBUMIN 2.2*  AST 23  ALT 21  ALKPHOS 58  BILITOT 0.8   PT/INR No results for input(s): "LABPROT", "INR" in the last 72 hours.   Scheduled Meds:  enoxaparin (LOVENOX) injection  40 mg Subcutaneous Q24H   feeding supplement  1 Container Oral TID BM   feeding supplement (PROSource TF20)  60 mL Per Tube Daily   free water  150 mL Per Tube Q4H   guaiFENesin  600 mg Oral BID   hydrocortisone cream   Topical QID   insulin aspart  0-9 Units Subcutaneous Q4H   pantoprazole (PROTONIX) IV  40 mg Intravenous Q24H   polyethylene glycol  17 g Oral TID   senna-docusate  1 tablet Oral QHS   sodium chloride  1 drop Both Eyes QHS     Continuous Infusions:  feeding supplement (OSMOLITE 1.5 CAL) 40 mL (12/01/22 0614)   methocarbamol (ROBAXIN) IV     piperacillin-tazobactam (ZOSYN)  IV 3.375 g (12/01/22 0827)        Impression/Plan:   Gallstone pancreatitis with choledocholithiasis Unsuccessful ERCP attempt 5/20 due to edema at the ampulla Repeat MRCP shows small stone in CBD and focal complication of pancreatitis with areas of hemorrhage/pseudocyst and necrosis and mass effect on distal CBD WBC downtrending Continue NG/NJ feeding, patient on heart healthy diet, consider n.p.o. with NG feedings but for now we can do full liquid/soft  diet Tentative plan for ERCP Thursday however may need transfer to tertiary center if ERCP attempt is unsuccessful versus IR consult Will need n.p.o. at midnight 5/30, hold tube feedings Lovenox discontinued tonight Continue Zosyn  Pancreatitis with hemorrhage/necrosis/pseudocyst with mass effect on distal CBD Continue NG/NG feedings, hold tube feedings. at midnight Continue to encourage ambulation Hemoglobin stable for now if any drop in hemoglobin consider CTA to evaluate for pseudoaneurysm  Constipation MiraLAX as needed Soft abdomen 4 bowel movements yesterday, 2 bowel movements today  Principal Problem:   Acute gallstone pancreatitis Active Problems:   CAD (coronary artery disease)   GERD (gastroesophageal reflux disease)   Hyperlipidemia, mixed   Type 2 diabetes mellitus with peripheral angiopathy (HCC)   Choledocholithiasis   Necrotizing pancreatitis   Hemorrhagic pancreatitis   History of ERCP   Biliary obstruction   Moderate protein-calorie malnutrition (HCC)   Gallstone pancreatitis    LOS: 14 days   Kahmari Koller R Elham Fini  12/01/2022, 2:04 PM   

## 2022-12-01 NOTE — Inpatient Diabetes Management (Signed)
Inpatient Diabetes Program Recommendations  AACE/ADA: New Consensus Statement on Inpatient Glycemic Control (2015)  Target Ranges:  Prepandial:   less than 140 mg/dL      Peak postprandial:   less than 180 mg/dL (1-2 hours)      Critically ill patients:  140 - 180 mg/dL   Lab Results  Component Value Date   GLUCAP 278 (H) 12/01/2022   HGBA1C 5.8 (H) 11/19/2022    Review of Glycemic Control  Latest Reference Range & Units 11/30/22 16:32 11/30/22 20:38 11/30/22 23:49 12/01/22 04:25 12/01/22 08:02  Glucose-Capillary 70 - 99 mg/dL 629 (H) 528 (H) 413 (H) 236 (H) 278 (H)   Diabetes history: DM 2 Outpatient Diabetes medications: diet controlled Current orders for Inpatient glycemic control:  Novolog 0-9 units Q4 hours  A1c 5.8% on 5/17 Boost tid between meals Osmolite 55 ml/hour  Inpatient Diabetes Program Recommendations:    Note Novolog Correction scale ordered today.  If glucose trends continue to be elevated after the start of correction scale, consider Novolog Tube feed coverage q4 hours.  Thanks,  Christena Deem RN, MSN, BC-ADM Inpatient Diabetes Coordinator Team Pager [phone] (8a-5p)

## 2022-12-02 ENCOUNTER — Encounter (HOSPITAL_COMMUNITY)
Disposition: A | Discharge status: Home / Self Care | Payer: Self-pay | Source: Other Acute Inpatient Hospital | Attending: Internal Medicine

## 2022-12-02 ENCOUNTER — Inpatient Hospital Stay (HOSPITAL_COMMUNITY): Payer: HMO | Admitting: Anesthesiology

## 2022-12-02 ENCOUNTER — Inpatient Hospital Stay (HOSPITAL_COMMUNITY): Payer: HMO

## 2022-12-02 ENCOUNTER — Encounter (HOSPITAL_COMMUNITY): Payer: Self-pay | Admitting: Internal Medicine

## 2022-12-02 DIAGNOSIS — K2970 Gastritis, unspecified, without bleeding: Secondary | ICD-10-CM

## 2022-12-02 DIAGNOSIS — K3189 Other diseases of stomach and duodenum: Secondary | ICD-10-CM

## 2022-12-02 DIAGNOSIS — Z9889 Other specified postprocedural states: Secondary | ICD-10-CM

## 2022-12-02 DIAGNOSIS — K8051 Calculus of bile duct without cholangitis or cholecystitis with obstruction: Secondary | ICD-10-CM | POA: Diagnosis not present

## 2022-12-02 DIAGNOSIS — K851 Biliary acute pancreatitis without necrosis or infection: Secondary | ICD-10-CM | POA: Diagnosis not present

## 2022-12-02 DIAGNOSIS — N289 Disorder of kidney and ureter, unspecified: Secondary | ICD-10-CM

## 2022-12-02 DIAGNOSIS — E782 Mixed hyperlipidemia: Secondary | ICD-10-CM

## 2022-12-02 DIAGNOSIS — K859 Acute pancreatitis without necrosis or infection, unspecified: Secondary | ICD-10-CM | POA: Diagnosis not present

## 2022-12-02 DIAGNOSIS — I2510 Atherosclerotic heart disease of native coronary artery without angina pectoris: Secondary | ICD-10-CM

## 2022-12-02 DIAGNOSIS — E119 Type 2 diabetes mellitus without complications: Secondary | ICD-10-CM

## 2022-12-02 DIAGNOSIS — K8591 Acute pancreatitis with uninfected necrosis, unspecified: Secondary | ICD-10-CM

## 2022-12-02 DIAGNOSIS — K8050 Calculus of bile duct without cholangitis or cholecystitis without obstruction: Secondary | ICD-10-CM | POA: Diagnosis not present

## 2022-12-02 HISTORY — PX: REMOVAL OF STONES: SHX5545

## 2022-12-02 HISTORY — PX: ERCP: SHX5425

## 2022-12-02 HISTORY — PX: BIOPSY: SHX5522

## 2022-12-02 HISTORY — PX: SPHINCTEROTOMY: SHX5544

## 2022-12-02 LAB — COMPREHENSIVE METABOLIC PANEL
ALT: 23 U/L (ref 0–44)
ALT: 25 U/L (ref 0–44)
AST: 28 U/L (ref 15–41)
AST: 31 U/L (ref 15–41)
Albumin: 2.4 g/dL — ABNORMAL LOW (ref 3.5–5.0)
Albumin: 2.4 g/dL — ABNORMAL LOW (ref 3.5–5.0)
Alkaline Phosphatase: 62 U/L (ref 38–126)
Alkaline Phosphatase: 59 U/L (ref 38–126)
Anion gap: 9 (ref 5–15)
Anion gap: 8 (ref 5–15)
BUN: 13 mg/dL (ref 8–23)
BUN: 13 mg/dL (ref 8–23)
CO2: 28 mmol/L (ref 22–32)
CO2: 26 mmol/L (ref 22–32)
Calcium: 8.5 mg/dL — ABNORMAL LOW (ref 8.9–10.3)
Calcium: 8.2 mg/dL — ABNORMAL LOW (ref 8.9–10.3)
Chloride: 98 mmol/L (ref 98–111)
Chloride: 100 mmol/L (ref 98–111)
Creatinine, Ser: 1.02 mg/dL (ref 0.61–1.24)
Creatinine, Ser: 0.96 mg/dL (ref 0.61–1.24)
GFR, Estimated: >60 mL/min (ref >60)
GFR, Estimated: >60 mL/min (ref >60)
Glucose, Bld: 153 mg/dL — ABNORMAL HIGH (ref 70–99)
Glucose, Bld: 161 mg/dL — ABNORMAL HIGH (ref 70–99)
Potassium: 6 mmol/L — ABNORMAL HIGH (ref 3.5–5.1)
Potassium: 4.2 mmol/L (ref 3.5–5.1)
Sodium: 135 mmol/L (ref 135–145)
Sodium: 134 mmol/L — ABNORMAL LOW (ref 135–145)
Total Bilirubin: 0.7 mg/dL (ref 0.3–1.2)
Total Bilirubin: 0.8 mg/dL (ref 0.3–1.2)
Total Protein: 6.8 g/dL (ref 6.5–8.1)
Total Protein: 6.6 g/dL (ref 6.5–8.1)

## 2022-12-02 LAB — CBC
HCT: 36.7 % — ABNORMAL LOW (ref 39.0–52.0)
Hemoglobin: 11.9 g/dL — ABNORMAL LOW (ref 13.0–17.0)
MCH: 28.8 pg (ref 26.0–34.0)
MCHC: 32.4 g/dL (ref 30.0–36.0)
MCV: 88.9 fL (ref 80.0–100.0)
Platelets: 385 10*3/uL (ref 150–400)
RBC: 4.13 MIL/uL — ABNORMAL LOW (ref 4.22–5.81)
RDW: 14.5 % (ref 11.5–15.5)
WBC: 11.4 10*3/uL — ABNORMAL HIGH (ref 4.0–10.5)
nRBC: 0 % (ref 0.0–0.2)

## 2022-12-02 LAB — GLUCOSE, CAPILLARY
Glucose-Capillary: 122 mg/dL — ABNORMAL HIGH (ref 70–99)
Glucose-Capillary: 142 mg/dL — ABNORMAL HIGH (ref 70–99)
Glucose-Capillary: 160 mg/dL — ABNORMAL HIGH (ref 70–99)
Glucose-Capillary: 211 mg/dL — ABNORMAL HIGH (ref 70–99)
Glucose-Capillary: 238 mg/dL — ABNORMAL HIGH (ref 70–99)

## 2022-12-02 LAB — PROTIME-INR
INR: 1.1 (ref 0.8–1.2)
Prothrombin Time: 14.3 seconds (ref 11.4–15.2)

## 2022-12-02 LAB — C-REACTIVE PROTEIN: CRP: 6.2 mg/dL — ABNORMAL HIGH (ref <1.0)

## 2022-12-02 LAB — SEDIMENTATION RATE: Sed Rate: 66 mm/hr — ABNORMAL HIGH (ref 0–16)

## 2022-12-02 SURGERY — ERCP, WITH INTERVENTION IF INDICATED
Anesthesia: General

## 2022-12-02 MED ORDER — LIDOCAINE 2% (20 MG/ML) 5 ML SYRINGE
INTRAMUSCULAR | Status: DC | PRN
  Administered 2022-12-02: 50 mg via INTRAVENOUS

## 2022-12-02 MED ORDER — PROPOFOL 10 MG/ML IV BOLUS
INTRAVENOUS | Status: DC | PRN
  Administered 2022-12-02: 100 mg via INTRAVENOUS

## 2022-12-02 MED ORDER — PHENYLEPHRINE 80 MCG/ML (10ML) SYRINGE FOR IV PUSH (FOR BLOOD PRESSURE SUPPORT)
PREFILLED_SYRINGE | INTRAVENOUS | Status: DC | PRN
  Administered 2022-12-02 (×4): 160 ug via INTRAVENOUS

## 2022-12-02 MED ORDER — GLUCAGON HCL RDNA (DIAGNOSTIC) 1 MG IJ SOLR
INTRAMUSCULAR | Status: AC
  Filled 2022-12-02: qty 1

## 2022-12-02 MED ORDER — LACTATED RINGERS IV SOLN: INTRAVENOUS | Status: DC

## 2022-12-02 MED ORDER — SUCCINYLCHOLINE CHLORIDE 200 MG/10ML IV SOSY
PREFILLED_SYRINGE | INTRAVENOUS | Status: DC | PRN
  Administered 2022-12-02: 100 mg via INTRAVENOUS

## 2022-12-02 MED ORDER — SODIUM CHLORIDE 0.9 % IV SOLN
INTRAVENOUS | Status: DC | PRN
  Administered 2022-12-02: 10 mL

## 2022-12-02 MED ORDER — CIPROFLOXACIN IN D5W 400 MG/200ML IV SOLN
INTRAVENOUS | Status: AC
  Filled 2022-12-02: qty 200

## 2022-12-02 MED ORDER — DICLOFENAC SUPPOSITORY 100 MG
RECTAL | Status: DC | PRN
  Administered 2022-12-02: 100 mg via RECTAL

## 2022-12-02 MED ORDER — FENTANYL CITRATE (PF) 100 MCG/2ML IJ SOLN
INTRAMUSCULAR | Status: DC | PRN
  Administered 2022-12-02 (×2): 50 ug via INTRAVENOUS

## 2022-12-02 MED ORDER — DEXAMETHASONE SODIUM PHOSPHATE 10 MG/ML IJ SOLN
INTRAMUSCULAR | Status: DC | PRN
  Administered 2022-12-02: 10 mg via INTRAVENOUS

## 2022-12-02 MED ORDER — ONDANSETRON HCL 4 MG/2ML IJ SOLN
INTRAMUSCULAR | Status: DC | PRN
  Administered 2022-12-02: 4 mg via INTRAVENOUS

## 2022-12-02 MED ORDER — GLUCAGON HCL RDNA (DIAGNOSTIC) 1 MG IJ SOLR
INTRAMUSCULAR | Status: DC | PRN
  Administered 2022-12-02: .25 mg via INTRAVENOUS

## 2022-12-02 MED ORDER — FENTANYL CITRATE (PF) 100 MCG/2ML IJ SOLN
INTRAMUSCULAR | Status: AC
  Filled 2022-12-02: qty 2

## 2022-12-02 MED ORDER — DICLOFENAC SUPPOSITORY 100 MG
RECTAL | Status: AC
  Filled 2022-12-02: qty 1

## 2022-12-02 MED ORDER — SODIUM CHLORIDE 0.9 % IV SOLN: INTRAVENOUS | Status: DC

## 2022-12-02 NOTE — Anesthesia Preprocedure Evaluation (Addendum)
Anesthesia Evaluation  Patient identified by MRN, date of birth, ID band Patient awake    Reviewed: Allergy & Precautions, NPO status , Patient's Chart, lab work & pertinent test results  History of Anesthesia Complications Negative for: history of anesthetic complications  Airway Mallampati: II  TM Distance: >3 FB Neck ROM: Full    Dental  (+) Edentulous Upper, Dental Advisory Given   Pulmonary neg pulmonary ROS   breath sounds clear to auscultation       Cardiovascular (-) angina + CAD, + Past MI and + Cardiac Stents  + dysrhythmias Atrial Fibrillation  Rhythm:Regular Rate:Normal     Neuro/Psych negative neurological ROS     GI/Hepatic ,GERD  Medicated and Controlled,,Gallstone pancreatitis N/v with gallstone pancreatitis   Endo/Other  diabetes (glu 142)    Renal/GU Renal InsufficiencyRenal disease     Musculoskeletal   Abdominal   Peds  Hematology negative hematology ROS (+)   Anesthesia Other Findings   Reproductive/Obstetrics                             Anesthesia Physical Anesthesia Plan  ASA: 3  Anesthesia Plan: General   Post-op Pain Management: Minimal or no pain anticipated   Induction: Intravenous and Rapid sequence  PONV Risk Score and Plan: 2 and Ondansetron and Dexamethasone  Airway Management Planned: Oral ETT  Additional Equipment: None  Intra-op Plan:   Post-operative Plan: Extubation in OR  Informed Consent: I have reviewed the patients History and Physical, chart, labs and discussed the procedure including the risks, benefits and alternatives for the proposed anesthesia with the patient or authorized representative who has indicated his/her understanding and acceptance.     Dental advisory given  Plan Discussed with: CRNA and Surgeon  Anesthesia Plan Comments:         Anesthesia Quick Evaluation

## 2022-12-02 NOTE — Transfer of Care (Signed)
Immediate Anesthesia Transfer of Care Note  Patient: Jon Bray  Procedure(s) Performed: ENDOSCOPIC RETROGRADE CHOLANGIOPANCREATOGRAPHY (ERCP) SPHINCTEROTOMY REMOVAL OF STONES BIOPSY  Patient Location: PACU  Anesthesia Type:General  Level of Consciousness: awake and patient cooperative  Airway & Oxygen Therapy: Patient Spontanous Breathing  Post-op Assessment: Report given to RN and Post -op Vital signs reviewed and stable  Post vital signs: Reviewed and stable  Last Vitals:  Vitals Value Taken Time  BP 122/77 12/02/22 1100  Temp    Pulse 89 12/02/22 1102  Resp 20 12/02/22 1102  SpO2 93 % 12/02/22 1102  Vitals shown include unvalidated device data.  Last Pain:  Vitals:   12/02/22 0909  TempSrc: Temporal  PainSc: 0-No pain      Patients Stated Pain Goal: 3 (12/01/22 1548)  Complications: No notable events documented.

## 2022-12-02 NOTE — Progress Notes (Signed)
PROGRESS NOTE    Jon Bray  WJX:[id number] DOB: 05/13/1941 DOA: 11/17/2022 PCP: Danella Penton, MD   Brief Narrative:  Jon Bray is a 82 y.o. male with PMH significant for DM2, HTN, HLD, CAD/NSTEMI/stent 2014, paroxysmal A-fib 5/14, patient was brought to the ED at [facility] with complaint of worsening nausea, vomiting.   In March, patient saw his primary care provider for loss of appetite, weight loss. Workup showed elevated liver enzymes and bilirubin.  3/27 CT abdomen and pelvis showed focal wall thickening in the urinary bladder adjacent to right UVJ suspicious for neoplasm, also extensive sigmoid diverticulosis 3/28, MRI/MRCP showed choledocholithiasis with 2 tiny gallstones near the ampulla measuring 0.2 to 0.3 cm and mild intra and extrahepatic biliary duct dilatation, CBD dilated to 0.9 cm Patient refused to go to ED for urgent evaluation.  PCP sent referral to general surgery and GI 4/9, seen by surgeon Dr. Trisha Mangle.  Cholecystectomy was recommended but patient wanted to wait it out.   5/14, patient was brought to the ED for 3 days of nausea, vomiting. Initial workup showed lipase elevated over 10,000, LFTs elevated as well MRI abdomen showed acute interstitial pancreatitis, choledocholithiasis with 4 mm stone in distal CBD. He was started on conservative management with IV fluid, IV analgesia, IV antiemetics, bowel rest Admitted to [facility] GI and general surgery were consulted. 5/20, ERCP was attempted but had to be aborted due to inability to identify the papula.  Follow-up MRCP showed persistent small stone in the CBD, changes of pancreatitis along with small fluid collection. Overall, liver enzymes continue to improve, Currently, patient is tentatively planned for ERCP by Dr. Meridee Score today (12/02/22).  Tube feedings were initiated on 11/30/2022 but NG has been removed and Cortrak to be replaced.  Assessment and Plan:  Acute Gallstone Pancreatitis with  hemorrhage/necrosis/pseudocyst and mass effect on the distal common bile duct/ Choledocholithiasis / Persistent leukocytosis, trending down slowly Timeline of events as above. -GI and general surgery following -Pancreatitis seems to have improved.  Currently was  to tolerate regular consistency diet and LFTs normalized and bilirubin fairly stable but  Underwent MRCP 11/29/2022 which unfortunately shows worsening pancreatitis with small area of pancreatic necrosis at the head with possibly evolving hemorrhage and enlarging pseudocyst.   -It noted Pancreatic inflammation exerts mass effect on distal CBD, there continues to be 3 mm filling defect in distal CBD suspicious for choledocholithiasis.   -WBC Trend: Recent Labs  Lab 11/26/22 0329 11/27/22 0426 11/28/22 0242 11/29/22 0301 11/30/22 0310 12/01/22 0529 12/02/22 0248  WBC 21.5* 19.0* 17.4* 14.0* 13.2* 11.9* 11.4*  -Plan for ERCP by Dr. Meridee Score today and it showed " J-shaped gastric deformity. Gastritis in antrum. No other gross lesions in the entire stomach. Biopsied for H. pylori. No gross lesions in the duodenal bulb, in the first portion of the duodenum and in the second portion of the duodenum. The major papilla appeared normal (though hidden under multiple folds).The fluoroscopic examination was suspicious for sludge. Choledocholithiasis was found. Complete removal was accomplished by biliary sphincterotomy and balloon sweeping. The very distal biliary duct had a slight narrowing compared to the majority of the bile duct, though this is not clearly a stricture and the balloon could not pass through this area with ease after stone extraction."   -Patient has been started on NG tube feedings since 11/30/2022 with Osmolite 1.5 Cal Liquid at 55 mL/hr with Prosource TF20 60 mL/Day and Free Water Flushes 150 mL q4h -Remains on antibiotics per GI  and general surgery recommendations with IV Zosyn -GI recommends replacing NG with Cortrak today  however cortrak team is not available today.  GI recommending continuing to check liver enzymes continue supportive care along with watching for pancreatitis, cholangitis, perforation and or bleeding -GI recommends continue to monitor supportive patient over the course of the coming days and hopefully he is no evidence of post ERCP pancreatitis or other complications and recommends in the coming days and weeks to consider cholecystectomy pending his overall clinical course from his necrotizing/hemorrhagic pancreatitis -GI is also recommending holding Lovenox for at least 24 hours -Diet advancement per GI and is on a full liquid diet today given lack of Cortrak team   Bibasilar Atelectasis -Persistent bibasilar atelectasis noted in chest x-ray done on 5/18 and repeated 5/24.   -But he is asymptomatic and not hypoxic.   -Continue Incentive spirometry and will order Flutter Valve   CAD/NSTEMI/stent 2014 HLD -No anginal symptoms currently. -PTA on aspirin 81 mg daily.  Currently on hold given NPO  -Not on statin as an outpatient   Paroxysmal A-fib  -Was on aspirin daily which is currently on hold.  -Not on anticoagulation as an outpatient -Remains on Med-Surge but  if necessary will transfer to Telemetry    Type 2 Diabetes mellitus Diet controlled.  Not on meds, A1c 5.8 on 11/19/2022 -CBG Trend: Recent Labs  Lab 12/01/22 0802 12/01/22 1204 12/01/22 1608 12/01/22 1954 12/01/22 2333 12/02/22 0339 12/02/22 0755  GLUCAP 278* 151* 159* 178* 175* 122* 142*    GERD/GI Prophylaxis  -Continue PPI with pantoprazole 40 mg IV every 24h   Bladder Wall Thickening On 3/27, CT abdomen and pelvis showed focal wall thickening in the bladder adjacent to the right UVJ suspicious for neoplasm.  Radiologist recommended cystoscopy for further evaluation. -Urinalysis 11/16/2022 did not show any evidence of hematuria. -Patient/family have not noticed any hematuria thereafter either. -5/22 Dr. Jacqulyn Bath  sent an epic message to on-call urology Dr. McDiarmid, recommended outpatient follow-up for further workup including cystoscopy.[name]    Extensive Sigmoid Diverticulosis  Chronic constipation -Continue Bowel regimen with MiraLAX 17 g p.o. daily as needed and senna docusate 1 tab p.o. nightly   Rash on the back. -Possible associated with newly linen material. -C/w Hydrocortisone Cream 1% Topically 4 times daily -No complaints at this time  Hyponatremia -Na+ Trend: Recent Labs  Lab 11/27/22 0426 11/28/22 0242 11/29/22 0301 11/30/22 0310 12/01/22 0529 12/02/22 0248 12/02/22 0845  NA 135 132* 131* 132* 131* 135 134*  -Continue to Monitor and Trend -Repeat CMP in the AM  Normocytic Anemia -Hgb/Hct Trend: Recent Labs  Lab 11/26/22 0329 11/27/22 0426 11/28/22 0242 11/29/22 0301 11/30/22 0310 12/01/22 0529 12/02/22 0248  HGB 11.4* 11.5* 11.2* 11.6* 11.8* 12.0* 11.9*  HCT 34.4* 34.5* 34.1* 34.9* 36.0* 36.2* 36.7*  MCV 88.4 88.7 87.9 85.3 88.5 86.4 88.9  -Check Anemia Panel in the AM -Continue to Monitor for S/Sx of Bleeding; No overt bleeding noted -Repeat CBC in the AM   Hemolyzed Blood Sample -K+ was elevated this AM and Trend showed: Recent Labs  Lab 11/27/22 0426 11/28/22 0242 11/29/22 0301 11/30/22 0310 12/01/22 0529 12/02/22 0248 12/02/22 0845  K 3.9 3.7 3.9 4.1 3.8 6.0* 4.2  -? Hemolysis so will repeat CMP this AM  Hypoalbuminemia -Patient's Albumin Trend: Recent Labs  Lab 11/27/22 0426 11/28/22 0242 11/29/22 0301 11/30/22 0310 12/01/22 0529 12/02/22 0248 12/02/22 0845  ALBUMIN 2.0* 2.1* 2.2* 2.3* 2.2* 2.4* 2.4*  -Continue to Monitor and Trend  and repeat CMP in the AM  DVT prophylaxis: SCDs Start: 11/17/22 1732    Code Status: Full Code Family Communication: Discussed with the wife at bedside  Disposition Plan:  Level of care: Med-Surg Status is: Inpatient Remains inpatient appropriate because: Undergoing ERCP today   Consultants:  General  Surgery Gastroenterology  Procedures:  As delineated as above  ERCP Findings:      The scout film was normal.      The upper GI tract was traversed under direct vision without detailed       examination. 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 Localized       moderate inflammation characterized by adherent blood, congestion       (edema), erythema and friability was found in the gastric antrum. No       other gross lesions were noted in the entire examined stomach (biopsied       for H. pylori). No gross lesions were noted in the duodenal bulb, in the       first portion of the duodenum and in the second portion of the duodenum.       The major papilla was normal in appearance and hidden under multiple       folds.      A short 0.035 inch Soft Jagwire was passed into the biliary tree after       engaging the ampulla with the sphincterotome. The Hydratome       sphincterotome was then passed over the guidewire and the bile duct was       then deeply cannulated. Contrast was injected. I personally interpreted       the bile duct images. Ductal flow of contrast was adequate. Image       quality was adequate. Contrast extended to the entire biliary tree.       Opacification of the entire biliary tree was successful. The maximum       diameter of the ducts was approximately 7 mm. The middle third of the       main bile duct contained filling defect thought to be sludge versus       stone. The very distal bile duct was slightly narrowed compared to the       rest of the biliary tree for approximately 15 mm in length (not clearly       a stenosis). A 7 mm biliary sphincterotomy was made with a monofilament       Hydratome sphincterotome using ERBE electrocautery. There was no       post-sphincterotomy bleeding. To discover objects, the biliary tree was       swept with a retrieval balloon. Sludge was swept from the duct. One       stone was removed. No stones remained. An occlusion  cholangiogram was       performed that showed no further significant biliary pathology and the       gallbladder began to fill.      A pancreatogram was not performed.      The duodenoscope was withdrawn from the patient. Impression:               - J-shaped gastric deformity.                           - Gastritis in antrum. No other gross lesions in  the entire stomach. Biopsied for H. pylori.                           - No gross lesions in the duodenal bulb, in the                            first portion of the duodenum and in the second                            portion of the duodenum.                           - The major papilla appeared normal (though hidden                            under multiple folds).                           - The fluoroscopic examination was suspicious for                            sludge.                           - Choledocholithiasis was found. Complete removal                            was accomplished by biliary sphincterotomy and                            balloon sweeping.                           - The very distal biliary duct had a slight                            narrowing compared to the majority of the bile                            duct, though this is not clearly a stricture and                            the balloon could not pass through this area with                            ease after stone extraction.. Recommendation:           - The patient will be observed post-procedure,                            until all discharge criteria are met.                           - Return patient to hospital ward for ongoing care.                           -  Have placed orders for Cortrack placement                            urgently in an effort of continuing tube feeds.                            This is why did not replace the NG tube today.                           - Observe patient's clinical course.                            - Watch for pancreatitis, bleeding, perforation,                            and cholangitis.                           - Check liver enzymes (AST, ALT, alkaline                            phosphatase, bilirubin) in the morning.                           - We will continue to monitor and support the                            patient over the course of the coming days.                            Hopefully no evidence of post ERCP pancreatitis or                            other complications will occur. At some point in                            the coming days/weeks we will consider the timing                            of cholecystectomy pending his overall clinical                            course from his necrotizing/hemorrhagic                            pancreatitis.                           - The findings and recommendations were discussed                            with the patient.                           - The findings and recommendations were discussed  with the patient's family.                           - The findings and recommendations were discussed                            with the referring physician.  Antimicrobials:  Anti-infectives (From admission, onward)    Start     Dose/Rate Route Frequency Ordered Stop   11/25/22 1400  [MAR Hold]  piperacillin-tazobactam (ZOSYN) IVPB 3.375 g        (MAR Hold since Thu 12/02/2022 at 0906.Hold Reason: Transfer to a Procedural area)   3.375 g 12.5 mL/hr over 240 Minutes Intravenous Every 8 hours 11/25/22 1136     11/18/22 0600  piperacillin-tazobactam (ZOSYN) IVPB 3.375 g  Status:  Discontinued        3.375 g 12.5 mL/hr over 240 Minutes Intravenous Every 8 hours 11/17/22 1753 11/24/22 0949   11/17/22 1845  piperacillin-tazobactam (ZOSYN) IVPB 3.375 g        3.375 g 100 mL/hr over 30 Minutes Intravenous  Once 11/17/22 1753 11/17/22 2012       Subjective: Seen and examined at  bedside and he is awaiting his ERCP this morning.  States his abdomen is not as sore now.  No nausea or vomiting.  States that he had an okay night.  No other concerns or complaints at this time.  Objective: Vitals:   12/02/22 0335 12/02/22 0500 12/02/22 0752 12/02/22 0909  BP: 131/72  (!) 140/71 139/80  Pulse: 74  92 84  Resp: 18  18 17   Temp:   98.1 F (36.7 C) (!) 97.1 F (36.2 C)  TempSrc: Oral  Oral Temporal  SpO2: 97%  99% 99%  Weight:  74.9 kg    Height:    5\' 8"  (1.727 m)    Intake/Output Summary (Last 24 hours) at 12/02/2022 1047 Last data filed at 12/02/2022 1043 Gross per 24 hour  Intake 1762.46 ml  Output 0 ml  Net 1762.46 ml   Filed Weights   11/17/22 1833 11/22/22 1351 12/02/22 0500  Weight: 76.2 kg 76.2 kg 74.9 kg   Examination: Physical Exam:  Constitutional: WN/WD overweight Caucasian male in no acute distress but has an NG tube in place Respiratory: Diminished to auscultation bilaterally, no wheezing, rales, rhonchi or crackles. Normal respiratory effort and patient is not tachypenic. No accessory muscle use.  Unlabored breathing Cardiovascular: RRR, no murmurs / rubs / gallops. S1 and S2 auscultated. No extremity edema.  Abdomen: Soft, non-tender, distended secondary to body habitus. Bowel sounds positive.  GU: Deferred. Musculoskeletal: No clubbing / cyanosis of digits/nails. No joint deformity upper and lower extremities. Neurologic: CN 2-12 grossly intact with no focal deficits. Romberg sign and cerebellar reflexes not assessed.  Psychiatric: Normal judgment and insight. Alert and oriented x 3. Normal mood and appropriate affect.   Data Reviewed: I have personally reviewed following labs and imaging studies  CBC: Recent Labs  Lab 11/27/22 0426 11/28/22 0242 11/29/22 0301 11/30/22 0310 12/01/22 0529 12/02/22 0248  WBC 19.0* 17.4* 14.0* 13.2* 11.9* 11.4*  NEUTROABS 14.3* 12.7* 9.6* 8.0* 7.1  --   HGB 11.5* 11.2* 11.6* 11.8* 12.0* 11.9*  HCT  34.5* 34.1* 34.9* 36.0* 36.2* 36.7*  MCV 88.7 87.9 85.3 88.5 86.4 88.9  PLT 297 307 319 355 358 385   Basic Metabolic Panel: Recent Labs  Lab 11/29/22 0301  11/30/22 0310 11/30/22 1639 12/01/22 0529 12/01/22 1710 12/02/22 0248 12/02/22 0845  NA 131* 132*  --  131*  --  135 134*  K 3.9 4.1  --  3.8  --  6.0* 4.2  CL 97* 96*  --  96*  --  98 100  CO2 25 24  --  24  --  28 26  GLUCOSE 164* 141*  --  253*  --  153* 161*  BUN 10 12  --  15  --  13 13  CREATININE 0.87 1.03  --  0.91  --  1.02 0.96  CALCIUM 7.9* 7.5*  --  8.0*  --  8.5* 8.2*  MG  --  2.0 2.1 2.0 2.0  --   --   PHOS  --  3.4 3.1 3.2 2.6  --   --    GFR: Estimated Creatinine Clearance: 56.4 mL/min (by C-G formula based on SCr of 0.96 mg/dL). Liver Function Tests: Recent Labs  Lab 11/29/22 0301 11/30/22 0310 12/01/22 0529 12/02/22 0248 12/02/22 0845  AST 20 24 23 28 31   ALT 21 21 21 23 25   ALKPHOS 60 64 58 62 59  BILITOT 1.3* 0.9 0.8 0.7 0.8  PROT 6.4* 6.6 6.4* 6.8 6.6  ALBUMIN 2.2* 2.3* 2.2* 2.4* 2.4*   No results for input(s): "LIPASE", "AMYLASE" in the last 168 hours. No results for input(s): "AMMONIA" in the last 168 hours. Coagulation Profile: Recent Labs  Lab 12/02/22 0248  INR 1.1   Cardiac Enzymes: No results for input(s): "CKTOTAL", "CKMB", "CKMBINDEX", "TROPONINI" in the last 168 hours. BNP (last 3 results) No results for input(s): "PROBNP" in the last 8760 hours. HbA1C: No results for input(s): "HGBA1C" in the last 72 hours. CBG: Recent Labs  Lab 12/01/22 1608 12/01/22 1954 12/01/22 2333 12/02/22 0339 12/02/22 0755  GLUCAP 159* 178* 175* 122* 142*   Lipid Profile: No results for input(s): "CHOL", "HDL", "LDLCALC", "TRIG", "CHOLHDL", "LDLDIRECT" in the last 72 hours. Thyroid Function Tests: No results for input(s): "TSH", "T4TOTAL", "FREET4", "T3FREE", "THYROIDAB" in the last 72 hours. Anemia Panel: No results for input(s): "VITAMINB12", "FOLATE", "FERRITIN", "TIBC", "IRON",  "RETICCTPCT" in the last 72 hours. Sepsis Labs: No results for input(s): "PROCALCITON", "LATICACIDVEN" in the last 168 hours.  No results found for this or any previous visit (from the past 240 hour(s)).   Radiology Studies: No results found.  Scheduled Meds:  [MAR Hold] feeding supplement  1 Container Oral TID BM   [MAR Hold] feeding supplement (PROSource TF20)  60 mL Per Tube Daily   [MAR Hold] free water  150 mL Per Tube Q4H   [MAR Hold] guaiFENesin  600 mg Oral BID   [MAR Hold] hydrocortisone cream   Topical QID   [MAR Hold] insulin aspart  0-9 Units Subcutaneous Q4H   [MAR Hold] pantoprazole (PROTONIX) IV  40 mg Intravenous Q24H   [MAR Hold] senna-docusate  1 tablet Oral QHS   [MAR Hold] sodium chloride  1 drop Both Eyes QHS   Continuous Infusions:  sodium chloride     feeding supplement (OSMOLITE 1.5 CAL) Stopped (12/01/22 2347)   lactated ringers     [MAR Hold] methocarbamol (ROBAXIN) IV     [MAR Hold] piperacillin-tazobactam (ZOSYN)  IV 3.375 g (12/02/22 0800)    LOS: 15 days   Marguerita Merles, DO [facility] Available via Epic secure chat 7am-7pm After these hours, please refer to coverage provider listed on [email or web address] 12/02/2022, 10:47 AM

## 2022-12-02 NOTE — Anesthesia Postprocedure Evaluation (Addendum)
Anesthesia Post Note  Patient: KITAI DEVINCENT  Procedure(s) Performed: ENDOSCOPIC RETROGRADE CHOLANGIOPANCREATOGRAPHY (ERCP) SPHINCTEROTOMY REMOVAL OF STONES BIOPSY     Patient location during evaluation: Endoscopy Anesthesia Type: General Level of consciousness: awake and alert, patient cooperative and oriented Pain management: pain level controlled Vital Signs Assessment: post-procedure vital signs reviewed and stable Respiratory status: spontaneous breathing, nonlabored ventilation, respiratory function stable and patient connected to nasal cannula oxygen Cardiovascular status: blood pressure returned to baseline and stable Postop Assessment: no apparent nausea or vomiting Anesthetic complications: no   No notable events documented.  Last Vitals:  Vitals:   12/02/22 1100 12/02/22 1120  BP: 122/77 119/69  Pulse: 82 78  Resp: 18 13  Temp:    SpO2: 94% 94%    Last Pain:  Vitals:   12/02/22 1120  TempSrc:   PainSc: 0-No pain                 Caitlinn Klinker,E. Cherise Fedder

## 2022-12-02 NOTE — Anesthesia Procedure Notes (Signed)
Procedure Name: Intubation Date/Time: 12/02/2022 10:15 AM  Performed by: Stanton Kidney, CRNAPre-anesthesia Checklist: Patient identified, Emergency Drugs available, Suction available and Patient being monitored Patient Re-evaluated:Patient Re-evaluated prior to induction Oxygen Delivery Method: Circle system utilized Preoxygenation: Pre-oxygenation with 100% oxygen Induction Type: IV induction Ventilation: Mask ventilation without difficulty Laryngoscope Size: Miller and 3 Grade View: Grade I Tube type: Oral Tube size: 7.5 mm Number of attempts: 1 Airway Equipment and Method: Stylet and Oral airway Placement Confirmation: ETT inserted through vocal cords under direct vision, positive ETCO2 and breath sounds checked- equal and bilateral Secured at: 23 cm Tube secured with: Tape Dental Injury: Teeth and Oropharynx as per pre-operative assessment

## 2022-12-02 NOTE — Interval H&P Note (Signed)
History and Physical Interval Note:  12/02/2022 9:34 AM  Jon Bray  has presented today for surgery, with the diagnosis of choledocholithiasis, gallstone pancreatitis.  The various methods of treatment have been discussed with the patient and family. After consideration of risks, benefits and other options for treatment, the patient has consented to  Procedure(s): ENDOSCOPIC RETROGRADE CHOLANGIOPANCREATOGRAPHY (ERCP) (N/A) as a surgical intervention.  The patient's history has been reviewed, patient examined, no change in status, stable for surgery.  I have reviewed the patient's chart and labs.  Questions were answered to the patient's satisfaction.     The risks of an ERCP were discussed at length, including but not limited to the risk of perforation, bleeding, abdominal pain, post-ERCP pancreatitis (while usually mild can be severe and even life threatening).    [employer]

## 2022-12-02 NOTE — Op Note (Addendum)
[facility] Patient Name: Jon Bray Procedure Date : 12/02/2022 MRN: [id number] Attending MD: Corliss Parish , MD, [id number] Date of Birth: 25-Dec-1940 CSN: [id number] Age: 82 Admit Type: Inpatient Procedure:                ERCP Indications:              Common bile duct stone(s), Abnormal MRCP, Preop                            exam: Cholecystectomy, Acute pancreatitis,                            Gallstone associated acute pancreatitis Providers:                Corliss Parish, MD, Lorenza Evangelist, RN, Harrington Challenger, Technician Referring MD:             Delton [place], Willaim Rayas. Armbruster, MD, inpatient                            medical service Medicines:                General Anesthesia, Diclofenac 100 mg rectal,                            Glucagon 0.25 mg IV Complications:            No immediate complications. Estimated Blood Loss:     Estimated blood loss was minimal. Procedure:                Pre-Anesthesia Assessment:                           - Prior to the procedure, a History and Physical                            was performed, and patient medications and                            allergies were reviewed. The patient's tolerance of                            previous anesthesia was also reviewed. The risks                            and benefits of the procedure and the sedation                            options and risks were discussed with the patient.                            All questions were answered, and informed consent                            was  obtained. Prior Anticoagulants: The patient has                            taken Lovenox (enoxaparin), last dose was 1 day                            prior to procedure. ASA Grade Assessment: III - A                            patient with severe systemic disease. After                            reviewing the risks and benefits, the patient was                             deemed in satisfactory condition to undergo the                            procedure.                           After obtaining informed consent, the scope was                            passed under direct vision. Throughout the                            procedure, the patient's blood pressure, pulse, and                            oxygen saturations were monitored continuously. The                            [id number]-Q190V ([id number] Olympus duodenoscope was                            introduced through the mouth, and used to inject                            contrast into and used to inject contrast into the                            bile duct. The ERCP was accomplished without                            difficulty. The patient tolerated the procedure. Scope In: Scope Out: Findings:      The scout film was normal.      The upper GI tract was traversed under direct vision without detailed       examination. 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 Localized       moderate inflammation characterized by adherent blood, congestion       (edema), erythema and friability was found in the gastric antrum. No       other gross lesions were noted in  the entire examined stomach (biopsied       for H. pylori). No gross lesions were noted in the duodenal bulb, in the       first portion of the duodenum and in the second portion of the duodenum.       The major papilla was normal in appearance and hidden under multiple       folds.      A short 0.035 inch Soft Jagwire was passed into the biliary tree after       engaging the ampulla with the sphincterotome. The Hydratome       sphincterotome was then passed over the guidewire and the bile duct was       then deeply cannulated. Contrast was injected. I personally interpreted       the bile duct images. Ductal flow of contrast was adequate. Image       quality was adequate. Contrast extended to the entire biliary tree.       Opacification of the  entire biliary tree was successful. The maximum       diameter of the ducts was approximately 7 mm. The middle third of the       main bile duct contained filling defect thought to be sludge versus       stone. The very distal bile duct was slightly narrowed compared to the       rest of the biliary tree for approximately 15 mm in length (not clearly       a stenosis). A 7 mm biliary sphincterotomy was made with a monofilament       Hydratome sphincterotome using ERBE electrocautery. There was no       post-sphincterotomy bleeding. To discover objects, the biliary tree was       swept with a retrieval balloon. Sludge was swept from the duct. One       stone was removed. No stones remained. An occlusion cholangiogram was       performed that showed no further significant biliary pathology and the       gallbladder began to fill.      A pancreatogram was not performed.      The duodenoscope was withdrawn from the patient. Impression:               - J-shaped gastric deformity.                           - Gastritis in antrum. No other gross lesions in                            the entire stomach. Biopsied for H. pylori.                           - No gross lesions in the duodenal bulb, in the                            first portion of the duodenum and in the second                            portion of the duodenum.                           -  The major papilla appeared normal (though hidden                            under multiple folds).                           - The fluoroscopic examination was suspicious for                            sludge.                           - Choledocholithiasis was found. Complete removal                            was accomplished by biliary sphincterotomy and                            balloon sweeping.                           - The very distal biliary duct had a slight                            narrowing compared to the majority of the bile                             duct, though this is not clearly a stricture and                            the balloon could not pass through this area with                            ease after stone extraction.. Recommendation:           - The patient will be observed post-procedure,                            until all discharge criteria are met.                           - Return patient to hospital ward for ongoing care.                           - Have placed orders for Cortrack placement                            urgently in an effort of continuing tube feeds.                            This is why did not replace the NG tube today.                           - Observe patient's clinical course.                           -  Watch for pancreatitis, bleeding, perforation,                            and cholangitis.                           - Check liver enzymes (AST, ALT, alkaline                            phosphatase, bilirubin) in the morning.                           - We will continue to monitor and support the                            patient over the course of the coming days.                            Hopefully no evidence of post ERCP pancreatitis or                            other complications will occur. At some point in                            the coming days/weeks we will consider the timing                            of cholecystectomy pending his overall clinical                            course from his necrotizing/hemorrhagic                            pancreatitis.                           - The findings and recommendations were discussed                            with the patient.                           - The findings and recommendations were discussed                            with the patient's family.                           - The findings and recommendations were discussed                            with the referring physician. Procedure Code(s):         --- Professional ---                           [phone], Endoscopic retrograde  cholangiopancreatography (ERCP); with removal of                            calculi/debris from biliary/pancreatic duct(s)                           43262, Endoscopic retrograde                            cholangiopancreatography (ERCP); with                            sphincterotomy/papillotomy                           74328, 26, Endoscopic catheterization of the                            biliary ductal system, radiological supervision and                            interpretation Diagnosis Code(s):        --- Professional ---                           K31.89, Other diseases of stomach and duodenum                           K29.70, Gastritis, unspecified, without bleeding                           K80.51, Calculus of bile duct without cholangitis                            or cholecystitis with obstruction                           Z01.818, Encounter for other preprocedural                            examination                           K85.90, Acute pancreatitis without necrosis or                            infection, unspecified                           K85.10, Biliary acute pancreatitis without necrosis                            or infection                           R93.2, Abnormal findings on diagnostic imaging of                            liver and biliary  tract CPT copyright 2022 [employer]. All rights reserved. The codes documented in this report are preliminary and upon coder review may  be revised to meet current compliance requirements. Corliss Parish, MD 12/02/2022 11:15:19 AM Number of Addenda: 0

## 2022-12-03 ENCOUNTER — Inpatient Hospital Stay (HOSPITAL_COMMUNITY): Payer: HMO | Admitting: Anesthesiology

## 2022-12-03 ENCOUNTER — Inpatient Hospital Stay (HOSPITAL_COMMUNITY): Payer: HMO

## 2022-12-03 ENCOUNTER — Encounter (HOSPITAL_COMMUNITY)
Disposition: A | Discharge status: Home / Self Care | Payer: Self-pay | Source: Other Acute Inpatient Hospital | Attending: Internal Medicine

## 2022-12-03 ENCOUNTER — Encounter (HOSPITAL_COMMUNITY): Payer: Self-pay | Admitting: Internal Medicine

## 2022-12-03 DIAGNOSIS — I252 Old myocardial infarction: Secondary | ICD-10-CM

## 2022-12-03 DIAGNOSIS — K811 Chronic cholecystitis: Secondary | ICD-10-CM | POA: Diagnosis not present

## 2022-12-03 DIAGNOSIS — E785 Hyperlipidemia, unspecified: Secondary | ICD-10-CM | POA: Diagnosis not present

## 2022-12-03 DIAGNOSIS — K831 Obstruction of bile duct: Secondary | ICD-10-CM | POA: Diagnosis not present

## 2022-12-03 DIAGNOSIS — K859 Acute pancreatitis without necrosis or infection, unspecified: Secondary | ICD-10-CM | POA: Diagnosis not present

## 2022-12-03 DIAGNOSIS — I2510 Atherosclerotic heart disease of native coronary artery without angina pectoris: Secondary | ICD-10-CM | POA: Diagnosis not present

## 2022-12-03 DIAGNOSIS — K219 Gastro-esophageal reflux disease without esophagitis: Secondary | ICD-10-CM | POA: Diagnosis not present

## 2022-12-03 DIAGNOSIS — Z9889 Other specified postprocedural states: Secondary | ICD-10-CM | POA: Diagnosis not present

## 2022-12-03 DIAGNOSIS — K8050 Calculus of bile duct without cholangitis or cholecystitis without obstruction: Secondary | ICD-10-CM | POA: Diagnosis not present

## 2022-12-03 DIAGNOSIS — K851 Biliary acute pancreatitis without necrosis or infection: Secondary | ICD-10-CM

## 2022-12-03 DIAGNOSIS — K5900 Constipation, unspecified: Secondary | ICD-10-CM | POA: Diagnosis not present

## 2022-12-03 DIAGNOSIS — Z955 Presence of coronary angioplasty implant and graft: Secondary | ICD-10-CM

## 2022-12-03 HISTORY — PX: CHOLECYSTECTOMY: SHX55

## 2022-12-03 LAB — VITAMIN B12: Vitamin B-12: 848 pg/mL (ref 180–914)

## 2022-12-03 LAB — CBC WITH DIFFERENTIAL/PLATELET
Abs Immature Granulocytes: 0.07 10*3/uL (ref 0.00–0.07)
Basophils Absolute: 0 10*3/uL (ref 0.0–0.1)
Basophils Relative: 0 %
Eosinophils Absolute: 0 10*3/uL (ref 0.0–0.5)
Eosinophils Relative: 0 %
HCT: 37.2 % — ABNORMAL LOW (ref 39.0–52.0)
Hemoglobin: 12 g/dL — ABNORMAL LOW (ref 13.0–17.0)
Immature Granulocytes: 1 %
Lymphocytes Relative: 30 %
Lymphs Abs: 3.3 10*3/uL (ref 0.7–4.0)
MCH: 28.2 pg (ref 26.0–34.0)
MCHC: 32.3 g/dL (ref 30.0–36.0)
MCV: 87.3 fL (ref 80.0–100.0)
Monocytes Absolute: 0.8 10*3/uL (ref 0.1–1.0)
Monocytes Relative: 7 %
Neutro Abs: 6.9 10*3/uL (ref 1.7–7.7)
Neutrophils Relative %: 62 %
Platelets: 422 10*3/uL — ABNORMAL HIGH (ref 150–400)
RBC: 4.26 MIL/uL (ref 4.22–5.81)
RDW: 14.4 % (ref 11.5–15.5)
WBC: 11.1 10*3/uL — ABNORMAL HIGH (ref 4.0–10.5)
nRBC: 0 % (ref 0.0–0.2)

## 2022-12-03 LAB — GLUCOSE, CAPILLARY
Glucose-Capillary: 134 mg/dL — ABNORMAL HIGH (ref 70–99)
Glucose-Capillary: 145 mg/dL — ABNORMAL HIGH (ref 70–99)
Glucose-Capillary: 166 mg/dL — ABNORMAL HIGH (ref 70–99)
Glucose-Capillary: 179 mg/dL — ABNORMAL HIGH (ref 70–99)
Glucose-Capillary: 182 mg/dL — ABNORMAL HIGH (ref 70–99)
Glucose-Capillary: 188 mg/dL — ABNORMAL HIGH (ref 70–99)
Glucose-Capillary: 189 mg/dL — ABNORMAL HIGH (ref 70–99)
Glucose-Capillary: 225 mg/dL — ABNORMAL HIGH (ref 70–99)

## 2022-12-03 LAB — RETICULOCYTES
Immature Retic Fract: 13 % (ref 2.3–15.9)
RBC.: 4.14 MIL/uL — ABNORMAL LOW (ref 4.22–5.81)
Retic Count, Absolute: 48.4 10*3/uL (ref 19.0–186.0)
Retic Ct Pct: 1.2 % (ref 0.4–3.1)

## 2022-12-03 LAB — BASIC METABOLIC PANEL
Anion gap: 8 (ref 5–15)
BUN: 14 mg/dL (ref 8–23)
CO2: 25 mmol/L (ref 22–32)
Calcium: 8.1 mg/dL — ABNORMAL LOW (ref 8.9–10.3)
Chloride: 99 mmol/L (ref 98–111)
Creatinine, Ser: 1.08 mg/dL (ref 0.61–1.24)
GFR, Estimated: >60 mL/min (ref >60)
Glucose, Bld: 196 mg/dL — ABNORMAL HIGH (ref 70–99)
Potassium: 3.8 mmol/L (ref 3.5–5.1)
Sodium: 132 mmol/L — ABNORMAL LOW (ref 135–145)

## 2022-12-03 LAB — IRON AND TIBC
Iron: 48 ug/dL (ref 45–182)
Saturation Ratios: 17 % — ABNORMAL LOW (ref 17.9–39.5)
TIBC: 283 ug/dL (ref 250–450)
UIBC: 235 ug/dL

## 2022-12-03 LAB — COMPREHENSIVE METABOLIC PANEL
ALT: 30 U/L (ref 0–44)
AST: 31 U/L (ref 15–41)
Albumin: 2.6 g/dL — ABNORMAL LOW (ref 3.5–5.0)
Alkaline Phosphatase: 64 U/L (ref 38–126)
Anion gap: 8 (ref 5–15)
BUN: 15 mg/dL (ref 8–23)
CO2: 27 mmol/L (ref 22–32)
Calcium: 8.9 mg/dL (ref 8.9–10.3)
Chloride: 100 mmol/L (ref 98–111)
Creatinine, Ser: 1.07 mg/dL (ref 0.61–1.24)
GFR, Estimated: >60 mL/min (ref >60)
Glucose, Bld: 157 mg/dL — ABNORMAL HIGH (ref 70–99)
Potassium: 5.3 mmol/L — ABNORMAL HIGH (ref 3.5–5.1)
Sodium: 135 mmol/L (ref 135–145)
Total Bilirubin: 0.7 mg/dL (ref 0.3–1.2)
Total Protein: 7.2 g/dL (ref 6.5–8.1)

## 2022-12-03 LAB — PHOSPHORUS: Phosphorus: 2.7 mg/dL (ref 2.5–4.6)

## 2022-12-03 LAB — FERRITIN: Ferritin: 474 ng/mL — ABNORMAL HIGH (ref 24–336)

## 2022-12-03 LAB — SURGICAL PATHOLOGY

## 2022-12-03 LAB — FOLATE: Folate: 15.8 ng/mL (ref >5.9)

## 2022-12-03 LAB — MAGNESIUM: Magnesium: 2.2 mg/dL (ref 1.7–2.4)

## 2022-12-03 SURGERY — LAPAROSCOPIC CHOLECYSTECTOMY
Anesthesia: General | Site: Abdomen

## 2022-12-03 MED ORDER — CHLORHEXIDINE GLUCONATE 0.12 % MT SOLN: 15.0000 mL | Freq: Once | OROMUCOSAL | Status: AC

## 2022-12-03 MED ORDER — OXYCODONE HCL 5 MG/5ML PO SOLN: 5.0000 mg | Freq: Once | ORAL | Status: DC | PRN

## 2022-12-03 MED ORDER — BUPIVACAINE-EPINEPHRINE (PF) 0.25% -1:200000 IJ SOLN
INTRAMUSCULAR | Status: AC
  Filled 2022-12-03: qty 30

## 2022-12-03 MED ORDER — DEXAMETHASONE SODIUM PHOSPHATE 10 MG/ML IJ SOLN
INTRAMUSCULAR | Status: AC
  Filled 2022-12-03: qty 1

## 2022-12-03 MED ORDER — PROPOFOL 10 MG/ML IV BOLUS
INTRAVENOUS | Status: DC | PRN
  Administered 2022-12-03: 30 mg via INTRAVENOUS
  Administered 2022-12-03: 100 mg via INTRAVENOUS

## 2022-12-03 MED ORDER — FENTANYL CITRATE (PF) 100 MCG/2ML IJ SOLN
INTRAMUSCULAR | Status: AC
  Filled 2022-12-03: qty 2

## 2022-12-03 MED ORDER — ROCURONIUM BROMIDE 10 MG/ML (PF) SYRINGE
PREFILLED_SYRINGE | INTRAVENOUS | Status: DC | PRN
  Administered 2022-12-03: 50 mg via INTRAVENOUS
  Administered 2022-12-03: 10 mg via INTRAVENOUS

## 2022-12-03 MED ORDER — ORAL CARE MOUTH RINSE: 15.0000 mL | Freq: Once | OROMUCOSAL | Status: AC

## 2022-12-03 MED ORDER — ESMOLOL HCL 100 MG/10ML IV SOLN
INTRAVENOUS | Status: AC
  Filled 2022-12-03: qty 10

## 2022-12-03 MED ORDER — PHENYLEPHRINE 80 MCG/ML (10ML) SYRINGE FOR IV PUSH (FOR BLOOD PRESSURE SUPPORT)
PREFILLED_SYRINGE | INTRAVENOUS | Status: DC | PRN
  Administered 2022-12-03: 80 ug via INTRAVENOUS
  Administered 2022-12-03: 160 ug via INTRAVENOUS

## 2022-12-03 MED ORDER — ONDANSETRON HCL 4 MG/2ML IJ SOLN
INTRAMUSCULAR | Status: DC | PRN
  Administered 2022-12-03: 4 mg via INTRAVENOUS

## 2022-12-03 MED ORDER — PROMETHAZINE HCL 25 MG/ML IJ SOLN: 6.2500 mg | INTRAMUSCULAR | Status: DC | PRN

## 2022-12-03 MED ORDER — PROPOFOL 10 MG/ML IV BOLUS
INTRAVENOUS | Status: AC
  Filled 2022-12-03: qty 20

## 2022-12-03 MED ORDER — LIDOCAINE 2% (20 MG/ML) 5 ML SYRINGE
INTRAMUSCULAR | Status: DC | PRN
  Administered 2022-12-03: 40 mg via INTRAVENOUS
  Administered 2022-12-03: 60 mg via INTRAVENOUS

## 2022-12-03 MED ORDER — FENTANYL CITRATE (PF) 250 MCG/5ML IJ SOLN
INTRAMUSCULAR | Status: AC
  Filled 2022-12-03: qty 5

## 2022-12-03 MED ORDER — LIDOCAINE 2% (20 MG/ML) 5 ML SYRINGE
INTRAMUSCULAR | Status: AC
  Filled 2022-12-03: qty 5

## 2022-12-03 MED ORDER — OXYCODONE HCL 5 MG PO TABS: 5.0000 mg | ORAL_TABLET | Freq: Once | ORAL | Status: DC | PRN

## 2022-12-03 MED ORDER — ROCURONIUM BROMIDE 10 MG/ML (PF) SYRINGE
PREFILLED_SYRINGE | INTRAVENOUS | Status: AC
  Filled 2022-12-03: qty 10

## 2022-12-03 MED ORDER — FENTANYL CITRATE (PF) 100 MCG/2ML IJ SOLN
25.0000 ug | INTRAMUSCULAR | Status: DC | PRN
  Administered 2022-12-03: 50 ug via INTRAVENOUS

## 2022-12-03 MED ORDER — DEXAMETHASONE SODIUM PHOSPHATE 10 MG/ML IJ SOLN
INTRAMUSCULAR | Status: DC | PRN
  Administered 2022-12-03: 5 mg via INTRAVENOUS

## 2022-12-03 MED ORDER — ONDANSETRON HCL 4 MG/2ML IJ SOLN
INTRAMUSCULAR | Status: AC
  Filled 2022-12-03: qty 2

## 2022-12-03 MED ORDER — LACTATED RINGERS IV SOLN: INTRAVENOUS | Status: DC

## 2022-12-03 MED ORDER — SODIUM CHLORIDE 0.9 % IR SOLN
Status: DC | PRN
  Administered 2022-12-03: 1

## 2022-12-03 MED ORDER — FENTANYL CITRATE (PF) 250 MCG/5ML IJ SOLN
INTRAMUSCULAR | Status: DC | PRN
  Administered 2022-12-03 (×5): 50 ug via INTRAVENOUS

## 2022-12-03 MED ORDER — 0.9 % SODIUM CHLORIDE (POUR BTL) OPTIME
TOPICAL | Status: DC | PRN
  Administered 2022-12-03: 1000 mL

## 2022-12-03 MED ORDER — SUGAMMADEX SODIUM 200 MG/2ML IV SOLN
INTRAVENOUS | Status: DC | PRN
  Administered 2022-12-03: 200 mg via INTRAVENOUS

## 2022-12-03 MED ORDER — BUPIVACAINE-EPINEPHRINE 0.25% -1:200000 IJ SOLN
INTRAMUSCULAR | Status: DC | PRN
  Administered 2022-12-03: 27 mL

## 2022-12-03 MED ORDER — CHLORHEXIDINE GLUCONATE 0.12 % MT SOLN
OROMUCOSAL | Status: AC
  Administered 2022-12-03: 15 mL via OROMUCOSAL
  Filled 2022-12-03: qty 15

## 2022-12-03 MED ORDER — PHENYLEPHRINE HCL-NACL 20-0.9 MG/250ML-% IV SOLN
INTRAVENOUS | Status: DC | PRN
  Administered 2022-12-03: 15 ug/min via INTRAVENOUS

## 2022-12-03 MED ORDER — ESMOLOL HCL 100 MG/10ML IV SOLN
INTRAVENOUS | Status: DC | PRN
  Administered 2022-12-03: 20 mg via INTRAVENOUS

## 2022-12-03 MED ORDER — MIDAZOLAM HCL 2 MG/2ML IJ SOLN: 0.5000 mg | Freq: Once | INTRAMUSCULAR | Status: DC | PRN

## 2022-12-03 SURGICAL SUPPLY — 43 items
ADH SKN CLS APL DERMABOND .7 (GAUZE/BANDAGES/DRESSINGS) ×1
APL PRP STRL LF DISP 70% ISPRP (MISCELLANEOUS) ×1
APPLIER CLIP 5 13 M/L LIGAMAX5 (MISCELLANEOUS) ×1
APR CLP MED LRG 5 ANG JAW (MISCELLANEOUS) ×1
BAG COUNTER SPONGE SURGICOUNT (BAG) ×1 IMPLANT
BAG SPNG CNTER NS LX DISP (BAG) ×1
BLADE CLIPPER SURG (BLADE) IMPLANT
CANISTER SUCT 3000ML PPV (MISCELLANEOUS) ×1 IMPLANT
CHLORAPREP W/TINT 26 (MISCELLANEOUS) ×1 IMPLANT
CLIP APPLIE 5 13 M/L LIGAMAX5 (MISCELLANEOUS) ×1 IMPLANT
COVER SURGICAL LIGHT HANDLE (MISCELLANEOUS) ×1 IMPLANT
DERMABOND ADVANCED .7 DNX12 (GAUZE/BANDAGES/DRESSINGS) ×1 IMPLANT
ELECT REM PT RETURN 9FT ADLT (ELECTROSURGICAL) ×1
ELECTRODE REM PT RTRN 9FT ADLT (ELECTROSURGICAL) ×1 IMPLANT
ENDOLOOP SUT PDS II  0 18 (SUTURE) ×2
ENDOLOOP SUT PDS II 0 18 (SUTURE) IMPLANT
GLOVE BIO SURGEON STRL SZ 6 (GLOVE) ×1 IMPLANT
GLOVE INDICATOR 6.5 STRL GRN (GLOVE) ×1 IMPLANT
GOWN STRL REUS W/ TWL LRG LVL3 (GOWN DISPOSABLE) ×3 IMPLANT
GOWN STRL REUS W/TWL LRG LVL3 (GOWN DISPOSABLE) ×4
GRASPER SUT TROCAR 14GX15 (MISCELLANEOUS) ×1 IMPLANT
IRRIG SUCT STRYKERFLOW 2 WTIP (MISCELLANEOUS) ×1
IRRIGATION SUCT STRKRFLW 2 WTP (MISCELLANEOUS) ×1 IMPLANT
KIT BASIN OR (CUSTOM PROCEDURE TRAY) ×1 IMPLANT
KIT TURNOVER KIT B (KITS) ×1 IMPLANT
NDL INSUFFLATION 14GA 120MM (NEEDLE) ×1 IMPLANT
NEEDLE INSUFFLATION 14GA 120MM (NEEDLE) ×1 IMPLANT
NS IRRIG 1000ML POUR BTL (IV SOLUTION) ×1 IMPLANT
PAD ARMBOARD 7.5X6 YLW CONV (MISCELLANEOUS) ×1 IMPLANT
SCISSORS LAP 5X35 DISP (ENDOMECHANICALS) ×1 IMPLANT
SET TUBE SMOKE EVAC HIGH FLOW (TUBING) ×1 IMPLANT
SLEEVE Z-THREAD 5X100MM (TROCAR) ×2 IMPLANT
SPECIMEN JAR SMALL (MISCELLANEOUS) ×1 IMPLANT
SUT MNCRL AB 4-0 PS2 18 (SUTURE) ×1 IMPLANT
SYS BAG RETRIEVAL 10MM (BASKET) ×1
SYSTEM BAG RETRIEVAL 10MM (BASKET) ×1 IMPLANT
TOWEL GREEN STERILE (TOWEL DISPOSABLE) IMPLANT
TOWEL GREEN STERILE FF (TOWEL DISPOSABLE) ×1 IMPLANT
TRAY LAPAROSCOPIC MC (CUSTOM PROCEDURE TRAY) ×1 IMPLANT
TROCAR 11X100 Z THREAD (TROCAR) ×1 IMPLANT
TROCAR Z-THREAD OPTICAL 5X100M (TROCAR) ×1 IMPLANT
WARMER LAPAROSCOPE (MISCELLANEOUS) ×1 IMPLANT
WATER STERILE IRR 1000ML POUR (IV SOLUTION) ×1 IMPLANT

## 2022-12-03 NOTE — Progress Notes (Signed)
PROGRESS NOTE    ADARION SPAZIANO  ZOX:[id number] DOB: [birth year] DOA: 11/17/2022 PCP: Danella Penton, MD   Brief Narrative:  Jon Bray is a 82 y.o. male with PMH significant for DM2, HTN, HLD, CAD/NSTEMI/stent 2014, paroxysmal A-fib 5/14, patient was brought to the ED at [facility] with complaint of worsening nausea, vomiting.   In March, patient saw his primary care provider for loss of appetite, weight loss. Workup showed elevated liver enzymes and bilirubin.  3/27 CT abdomen and pelvis showed focal wall thickening in the urinary bladder adjacent to right UVJ suspicious for neoplasm, also extensive sigmoid diverticulosis 3/28, MRI/MRCP showed choledocholithiasis with 2 tiny gallstones near the ampulla measuring 0.2 to 0.3 cm and mild intra and extrahepatic biliary duct dilatation, CBD dilated to 0.9 cm Patient refused to go to ED for urgent evaluation.  PCP sent referral to general surgery and GI 4/9, seen by surgeon Dr. Trisha Mangle.  Cholecystectomy was recommended but patient wanted to wait it out.   5/14, patient was brought to the ED for 3 days of nausea, vomiting. Initial workup showed lipase elevated over 10,000, LFTs elevated as well MRI abdomen showed acute interstitial pancreatitis, choledocholithiasis with 4 mm stone in distal CBD. He was started on conservative management with IV fluid, IV analgesia, IV antiemetics, bowel rest Admitted to [facility] [facility] and general surgery were consulted. 5/20, ERCP was attempted but had to be aborted due to inability to identify the papula.  Follow-up MRCP showed persistent small stone in the CBD, changes of pancreatitis along with small fluid collection. Overall, liver enzymes continue to improve, Underwent ERCP and Stone Extractionby Dr. Meridee Score (12/02/22).  Tube feedings were initiated on 11/30/2022 but NG has been removed and Cortrak to be replaced.  **Patient now underwent laparoscopic cholecystectomy today given that he felt he was stable for surgery and  because it was clinically felt that his pancreatitis seems to be improving.  Gastroenterology recommends continuing and placing a core track to allow time for his pancreatitis and cholecystectomy that he also this was placed in 2 feedings were resumed.  He remains on IV Zosyn.  Assessment and Plan:  Acute Gallstone Pancreatitis with hemorrhage/necrosis/pseudocyst and mass effect on the distal common bile duct/ Choledocholithiasis / Persistent leukocytosis, trending down slowly Status post laparoscopic cholecystectomy Timeline of events as above. -GI and general surgery following -Pancreatitis seems to have improved.  Currently was  to tolerate regular consistency diet and LFTs normalized and bilirubin fairly stable but  Underwent MRCP 11/29/2022 which unfortunately shows worsening pancreatitis with small area of pancreatic necrosis at the head with possibly evolving hemorrhage and enlarging pseudocyst.   -It noted Pancreatic inflammation exerts mass effect on distal CBD, there continues to be 3 mm filling defect in distal CBD suspicious for choledocholithiasis.   -WBC Trend: Recent Labs  Lab 11/27/22 0426 11/28/22 0242 11/29/22 0301 11/30/22 0310 12/01/22 0529 12/02/22 0248 12/03/22 0238  WBC 19.0* 17.4* 14.0* 13.2* 11.9* 11.4* 11.1*  -Underwent ERCP by Dr. Meridee Score today and it showed " J-shaped gastric deformity. Gastritis in antrum. No other gross lesions in the entire stomach. Biopsied for H. pylori. No gross lesions in the duodenal bulb, in the first portion of the duodenum and in the second portion of the duodenum. The major papilla appeared normal (though hidden under multiple folds).The fluoroscopic examination was suspicious for sludge. Choledocholithiasis was found. Complete removal was accomplished by biliary sphincterotomy and balloon sweeping. The very distal biliary duct had a slight narrowing compared to the majority  of the bile duct, though this is not clearly a stricture and  the balloon could not pass through this area with ease after stone extraction."   -Patient has been started on NG tube feedings since 11/30/2022 with Osmolite 1.5 Cal Liquid at 55 mL/hr with Prosource TF20 60 mL/Day and Free Water Flushes 150 mL q4h -Remains on antibiotics per GI and general surgery recommendations with IV Zosyn -GI recommends replacing NG with Cortrak today however cortrak team is not available today.  GI recommending continuing to check liver enzymes continue supportive care along with watching for pancreatitis, cholangitis, perforation and or bleeding -GI recommends continue to monitor supportive patient over the course of the coming days and hopefully he is no evidence of post ERCP pancreatitis or other complications and recommends in the coming days and weeks to consider cholecystectomy pending his overall clinical course from his necrotizing/hemorrhagic pancreatitis; it was felt patient was clinically improved to the point where he cannot undergo surgical intervention until general surgery took him for a laparoscopic cholecystectomy today -GI is also recommending holding Lovenox for at least 24 hours -Diet advancement per GI and is on a full liquid diet today given lack of Cortrak team but now core track has been replaced -Further management and care per GI and general surgery and anticipating discharging once he has been deemed stable and able to tolerate a diet -GI is planning on repeating imaging 1 week from the last MRI of the pancreas protocol CT abdomen   Bibasilar Atelectasis -Persistent bibasilar atelectasis noted in chest x-ray done on 5/18 and repeated 5/24.   -But he is asymptomatic and not hypoxic.   -Continue Incentive spirometry and will order Flutter Valve   CAD/NSTEMI/stent 2014 HLD -No anginal symptoms currently. -PTA on aspirin 81 mg daily.  Currently on hold given NPO  -Not on statin as an outpatient   Paroxysmal A-fib  -Was on aspirin daily which is  currently on hold.  -Not on anticoagulation as an outpatient -Remains on Med-Surge but  if necessary will transfer to Telemetry    Type 2 Diabetes mellitus Diet controlled.  Not on meds, A1c 5.8 on 11/19/2022 -CBG Trend: Recent Labs  Lab 12/02/22 2039 12/03/22 0001 12/03/22 0427 12/03/22 0749 12/03/22 1043 12/03/22 1153 12/03/22 1610  GLUCAP 238* 166* 134* 145* 179* 182* 189*    GERD/GI Prophylaxis  -Continue PPI with pantoprazole 40 mg IV every 24h   Bladder Wall Thickening On 3/27, CT abdomen and pelvis showed focal wall thickening in the bladder adjacent to the right UVJ suspicious for neoplasm.  Radiologist recommended cystoscopy for further evaluation. -Urinalysis 11/16/2022 did not show any evidence of hematuria. -Patient/family have not noticed any hematuria thereafter either. -5/22 Dr. Jacqulyn Bath sent an epic message to on-call urology Dr. McDiarmid, recommended outpatient follow-up for further workup including cystoscopy.[name]    Extensive Sigmoid Diverticulosis  Chronic constipation -Continue Bowel regimen with MiraLAX 17 g p.o. daily as needed and senna docusate 1 tab p.o. nightly   Rash on the back. -Possible associated with newly linen material. -C/w Hydrocortisone Cream 1% Topically 4 times daily -No complaints at this time   Hyponatremia -Na+ Trend: Recent Labs  Lab 11/29/22 0301 11/30/22 0310 12/01/22 0529 12/02/22 0248 12/02/22 0845 12/03/22 0238 12/03/22 1222  NA 131* 132* 131* 135 134* 135 132*  -Continue to Monitor and Trend -Repeat CMP in the AM   Normocytic Anemia -Hgb/Hct Trend: Recent Labs  Lab 11/27/22 0426 11/28/22 0242 11/29/22 0301 11/30/22 0310 12/01/22 0529 12/02/22 0248  12/03/22 0238  HGB 11.5* 11.2* 11.6* 11.8* 12.0* 11.9* 12.0*  HCT 34.5* 34.1* 34.9* 36.0* 36.2* 36.7* 37.2*  MCV 88.7 87.9 85.3 88.5 86.4 88.9 87.3  -Checked Anemia Panel and showed an iron level of 48, UIBC of 235, TIBC of 283, saturation ratios of 17%, ferritin  level of 474, folate of 15.8 and a vitamin B12 of 848 -Continue to Monitor for S/Sx of Bleeding; No overt bleeding noted -Repeat CBC in the AM    Hemolyzed Blood Samples -K+ was elevated this AM and Trend showed: Recent Labs  Lab 11/29/22 0301 11/30/22 0310 12/01/22 0529 12/02/22 0248 12/02/22 0845 12/03/22 0238 12/03/22 1222  K 3.9 4.1 3.8 6.0* 4.2 5.3* 3.8  -Likely Hemolysis so will repeat CMP this AM   Hypoalbuminemia -Patient's Albumin Trend: Recent Labs  Lab 11/28/22 0242 11/29/22 0301 11/30/22 0310 12/01/22 0529 12/02/22 0248 12/02/22 0845 12/03/22 0238  ALBUMIN 2.1* 2.2* 2.3* 2.2* 2.4* 2.4* 2.6*  -Continue to Monitor and Trend and repeat CMP in the AM  DVT prophylaxis: SCDs Start: 11/17/22 1732    Code Status: Full Code Family Communication: Discussed with the wife and friend at bedside  Disposition Plan:  Level of care: Med-Surg Status is: Inpatient Remains inpatient appropriate because: Further clearance by general surgery and GI   Consultants:  General Surgery Gastroenterology  Procedures:  As delineated as above  ERCP Findings:      The scout film was normal.      The upper GI tract was traversed under direct vision without detailed       examination. 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 Localized       moderate inflammation characterized by adherent blood, congestion       (edema), erythema and friability was found in the gastric antrum. No       other gross lesions were noted in the entire examined stomach (biopsied       for H. pylori). No gross lesions were noted in the duodenal bulb, in the       first portion of the duodenum and in the second portion of the duodenum.       The major papilla was normal in appearance and hidden under multiple       folds.      A short 0.035 inch Soft Jagwire was passed into the biliary tree after       engaging the ampulla with the sphincterotome. The Hydratome       sphincterotome was then  passed over the guidewire and the bile duct was       then deeply cannulated. Contrast was injected. I personally interpreted       the bile duct images. Ductal flow of contrast was adequate. Image       quality was adequate. Contrast extended to the entire biliary tree.       Opacification of the entire biliary tree was successful. The maximum       diameter of the ducts was approximately 7 mm. The middle third of the       main bile duct contained filling defect thought to be sludge versus       stone. The very distal bile duct was slightly narrowed compared to the       rest of the biliary tree for approximately 15 mm in length (not clearly       a stenosis). A 7 mm biliary sphincterotomy was made with a monofilament  Hydratome sphincterotome using ERBE electrocautery. There was no       post-sphincterotomy bleeding. To discover objects, the biliary tree was       swept with a retrieval balloon. Sludge was swept from the duct. One       stone was removed. No stones remained. An occlusion cholangiogram was       performed that showed no further significant biliary pathology and the       gallbladder began to fill.      A pancreatogram was not performed.      The duodenoscope was withdrawn from the patient. Impression:               - J-shaped gastric deformity.                           - Gastritis in antrum. No other gross lesions in                            the entire stomach. Biopsied for H. pylori.                           - No gross lesions in the duodenal bulb, in the                            first portion of the duodenum and in the second                            portion of the duodenum.                           - The major papilla appeared normal (though hidden                            under multiple folds).                           - The fluoroscopic examination was suspicious for                            sludge.                           - Choledocholithiasis  was found. Complete removal                            was accomplished by biliary sphincterotomy and                            balloon sweeping.                           - The very distal biliary duct had a slight                            narrowing compared to the majority of the bile  duct, though this is not clearly a stricture and                            the balloon could not pass through this area with                            ease after stone extraction.. Recommendation:           - The patient will be observed post-procedure,                            until all discharge criteria are met.                           - Return patient to hospital ward for ongoing care.                           - Have placed orders for Cortrack placement                            urgently in an effort of continuing tube feeds.                            This is why did not replace the NG tube today.                           - Observe patient's clinical course.                           - Watch for pancreatitis, bleeding, perforation,                            and cholangitis.                           - Check liver enzymes (AST, ALT, alkaline                            phosphatase, bilirubin) in the morning.                           - We will continue to monitor and support the                            patient over the course of the coming days.                            Hopefully no evidence of post ERCP pancreatitis or                            other complications will occur. At some point in                            the coming days/weeks we will consider the timing  of cholecystectomy pending his overall clinical                            course from his necrotizing/hemorrhagic                            pancreatitis.                           - The findings and recommendations were discussed                            with the  patient.                           - The findings and recommendations were discussed                            with the patient's family.                           - The findings and recommendations were discussed                            with the referring physician.  Laparoscopic Cholecystectomy by Dr. Phylliss Blakes  Antimicrobials:  Anti-infectives (From admission, onward)    Start     Dose/Rate Route Frequency Ordered Stop   11/25/22 1400  piperacillin-tazobactam (ZOSYN) IVPB 3.375 g        3.375 g 12.5 mL/hr over 240 Minutes Intravenous Every 8 hours 11/25/22 1136     11/18/22 0600  piperacillin-tazobactam (ZOSYN) IVPB 3.375 g  Status:  Discontinued        3.375 g 12.5 mL/hr over 240 Minutes Intravenous Every 8 hours 11/17/22 1753 11/24/22 0949   11/17/22 1845  piperacillin-tazobactam (ZOSYN) IVPB 3.375 g        3.375 g 100 mL/hr over 30 Minutes Intravenous  Once 11/17/22 1753 11/17/22 2012       Subjective: Examined at bedside after surgical intervention and he was complaining about some abdominal soreness.  Wanting to rest and felt okay.  Denied any complaints at this time.  Objective: Vitals:   12/03/22 1045 12/03/22 1100 12/03/22 1115 12/03/22 1130  BP: 139/75 136/75 131/75 131/78  Pulse: 91 86 85 85  Resp: 17 20 18 19   Temp:    (!) 97.5 F (36.4 C)  TempSrc:      SpO2: 96% 94% 95% 95%  Weight:      Height:        Intake/Output Summary (Last 24 hours) at 12/03/2022 1811 Last data filed at 12/03/2022 1036 Gross per 24 hour  Intake 800 ml  Output --  Net 800 ml   Filed Weights   12/02/22 0500 12/03/22 0429 12/03/22 0807  Weight: 74.9 kg 73.6 kg 73.6 kg   Examination: Physical Exam:  Constitutional: WN/WD overweight Caucasian male in no acute distress and has a Cortrak in place Respiratory: Diminished to auscultation bilaterally, no wheezing, rales, rhonchi or crackles. Normal respiratory effort and patient is not tachypenic. No accessory muscle use.   Unlabored breathing Cardiovascular: RRR, no murmurs / rubs / gallops. S1 and S2 auscultated. No extremity edema.  Abdomen:  Soft, tender to palpate, non-distended. No masses palpated.  Abdominal scars noted. Bowel sounds positive.  GU: Deferred. Musculoskeletal: No clubbing / cyanosis of digits/nails. No joint deformity upper and lower extremities.   Skin: No rashes, lesions, ulcers limited skin evaluation. No induration; Warm and dry.  Neurologic: CN 2-12 grossly intact with no focal deficits. Romberg sign and cerebellar reflexes not assessed.  Psychiatric: Normal judgment and insight. Alert and oriented x 3. Normal mood and appropriate affect.   Data Reviewed: I have personally reviewed following labs and imaging studies  CBC: Recent Labs  Lab 11/28/22 0242 11/29/22 0301 11/30/22 0310 12/01/22 0529 12/02/22 0248 12/03/22 0238  WBC 17.4* 14.0* 13.2* 11.9* 11.4* 11.1*  NEUTROABS 12.7* 9.6* 8.0* 7.1  --  6.9  HGB 11.2* 11.6* 11.8* 12.0* 11.9* 12.0*  HCT 34.1* 34.9* 36.0* 36.2* 36.7* 37.2*  MCV 87.9 85.3 88.5 86.4 88.9 87.3  PLT 307 319 355 358 385 422*   Basic Metabolic Panel: Recent Labs  Lab 11/30/22 0310 11/30/22 1639 12/01/22 0529 12/01/22 1710 12/02/22 0248 12/02/22 0845 12/03/22 0238 12/03/22 1222  NA 132*  --  131*  --  135 134* 135 132*  K 4.1  --  3.8  --  6.0* 4.2 5.3* 3.8  CL 96*  --  96*  --  98 100 100 99  CO2 24  --  24  --  28 26 27 25   GLUCOSE 141*  --  253*  --  153* 161* 157* 196*  BUN 12  --  15  --  13 13 15 14   CREATININE 1.03  --  0.91  --  1.02 0.96 1.07 1.08  CALCIUM 7.5*  --  8.0*  --  8.5* 8.2* 8.9 8.1*  MG 2.0 2.1 2.0 2.0  --   --  2.2  --   PHOS 3.4 3.1 3.2 2.6  --   --  2.7  --    GFR: Estimated Creatinine Clearance: 50.1 mL/min (by C-G formula based on SCr of 1.08 mg/dL). Liver Function Tests: Recent Labs  Lab 11/30/22 0310 12/01/22 0529 12/02/22 0248 12/02/22 0845 12/03/22 0238  AST 24 23 28 31 31   ALT 21 21 23 25 30   ALKPHOS  64 58 62 59 64  BILITOT 0.9 0.8 0.7 0.8 0.7  PROT 6.6 6.4* 6.8 6.6 7.2  ALBUMIN 2.3* 2.2* 2.4* 2.4* 2.6*   No results for input(s): "LIPASE", "AMYLASE" in the last 168 hours. No results for input(s): "AMMONIA" in the last 168 hours. Coagulation Profile: Recent Labs  Lab 12/02/22 0248  INR 1.1   Cardiac Enzymes: No results for input(s): "CKTOTAL", "CKMB", "CKMBINDEX", "TROPONINI" in the last 168 hours. BNP (last 3 results) No results for input(s): "PROBNP" in the last 8760 hours. HbA1C: No results for input(s): "HGBA1C" in the last 72 hours. CBG: Recent Labs  Lab 12/03/22 0427 12/03/22 0749 12/03/22 1043 12/03/22 1153 12/03/22 1610  GLUCAP 134* 145* 179* 182* 189*   Lipid Profile: No results for input(s): "CHOL", "HDL", "LDLCALC", "TRIG", "CHOLHDL", "LDLDIRECT" in the last 72 hours. Thyroid Function Tests: No results for input(s): "TSH", "T4TOTAL", "FREET4", "T3FREE", "THYROIDAB" in the last 72 hours. Anemia Panel: Recent Labs    12/03/22 0238  VITAMINB12 848  FOLATE 15.8  FERRITIN 474*  TIBC 283  IRON 48  RETICCTPCT 1.2   Sepsis Labs: No results for input(s): "PROCALCITON", "LATICACIDVEN" in the last 168 hours.  No results found for this or any previous visit (from the past 240 hour(s)).  Radiology Studies: DG Abd Portable 1V  Result Date: 12/03/2022 CLINICAL DATA:  Feeding tube placement EXAM: PORTABLE ABDOMEN - 1 VIEW COMPARISON:  11/29/2022 FINDINGS: Weighted tip feeding tube placement, tip near the pylorus. Stomach decompressed. Cholecystectomy clips. Normal bowel gas pattern. Aortic Atherosclerosis (ICD10-170.0). IMPRESSION: Feeding tube tip near the pylorus. Electronically Signed   By: Corlis Leak M.D.   On: 12/03/2022 15:17   DG ERCP  Result Date: 12/02/2022 CLINICAL DATA:  Gallstone pancreatitis. ERCP with sphincterotomy and balloon sweeps. EXAM: ERCP TECHNIQUE: Multiple spot images obtained with the fluoroscopic device and submitted for interpretation  post-procedure. FLUOROSCOPY TIME: FLUOROSCOPY TIME 1 minute, 13 seconds (17.4 mGy) COMPARISON:  Abdominal MRI- 11/29/2022 FINDINGS: Three spot intraoperative fluoroscopic images of the right upper abdominal quadrant during ERCP are provided for review as well as an additional balloon cholangiogram. Initial image demonstrates an ERCP probe overlying the right upper abdominal quadrant. There is selective cannulation and opacification of the common bile duct with tapered narrowing at its distal aspect. Subsequent images demonstrate insufflation of a balloon within the central aspect of the CBD with subsequent biliary sweeping and balloon cholangiogram. There is opacification of the cystic duct with passage of contrast to the level of the gallbladder. Several filling defects are seen within the gallbladder lumen compatible with known cholelithiasis. There is minimal opacification of the intrahepatic biliary tree which appears nondilated. There are no discrete filling defects within opacified portion of the biliary tree to suggest choledocholithiasis. There is no definitive opacification of the pancreatic duct. IMPRESSION: ERCP with biliary sweeping as detailed above. These images were submitted for radiologic interpretation only. Please see the procedural report for the amount of contrast and the fluoroscopy time utilized. Electronically Signed   By: Simonne Come M.D.   On: 12/02/2022 12:35    Scheduled Meds:  feeding supplement  1 Container Oral TID BM   feeding supplement (PROSource TF20)  60 mL Per Tube Daily   fentaNYL       free water  150 mL Per Tube Q4H   guaiFENesin  600 mg Oral BID   hydrocortisone cream   Topical QID   insulin aspart  0-9 Units Subcutaneous Q4H   pantoprazole (PROTONIX) IV  40 mg Intravenous Q24H   senna-docusate  1 tablet Oral QHS   sodium chloride  1 drop Both Eyes QHS   Continuous Infusions:  feeding supplement (OSMOLITE 1.5 CAL) Stopped (12/01/22 2347)   methocarbamol  (ROBAXIN) IV     piperacillin-tazobactam (ZOSYN)  IV 3.375 g (12/03/22 1649)    LOS: 16 days   Marguerita Merles, DO [facility] Available via Epic secure chat 7am-7pm After these hours, please refer to coverage provider listed on [email or web address] 12/03/2022, 6:11 PM

## 2022-12-03 NOTE — Op Note (Signed)
Operative Note  Jon Bray 82 y.o. male [id number]  12/03/2022  Surgeon: [name] MD FACS  Procedure performed: Laparoscopic Cholecystectomy  Procedure classification: urgent  Preop diagnosis: Gallstone pancreatitis Post-op diagnosis/intraop findings: same, chronic cholecystitis  Specimens: gallbladder  Retained items: none  EBL: minimal  Complications: none  Description of procedure: After obtaining informed consent the patient was brought to the operating room. Antibiotics were administered. SCD's were applied. General endotracheal anesthesia was initiated and a formal time-out was performed. The abdomen was prepped and draped in the usual sterile fashion and the abdomen was entered using an infraumbilical veress needle after instilling the site with local. Insufflation to was obtained, 5mm trocar and camera inserted, and gross inspection revealed no evidence of injury from our entry or other intraabdominal abnormalities.  He does have thin omental adhesions to the anterior abdominal wall just above the umbilicus which were left alone.  Two 5mm trocars were introduced in the right midclavicular and right anterior axillary lines under direct visualization and following infiltration with local. An 11mm trocar was placed in the epigastrium. The gallbladder fundus was retracted cephalad and omental adhesions to the gallbladder were taken down with cautery and blunt dissection until the infundibulum was able to be grasped and retracted laterally.  There was a band of omentum coursing along the lateral aspect of the infundibulum and cystic duct with a moderate-sized vein that was clipped before being divided with cautery as well.  A combination of hook electrocautery and blunt dissection was utilized to clear the peritoneum from the neck and cystic duct, circumferentially isolating the cystic artery and cystic duct and lifting the gallbladder from the cystic plate. The critical view  of safety was achieved with the cystic artery, cystic duct, and liver bed visualized between them with no other structures.  The artery had a branch going onto the cystic duct as well as onto the gallbladder and these were both doubly clipped and ligated.  The cystic duct was very short and patulous, and it seemed that the porta was tethered medially towards this. The gallbladder was dissected from the liver plate using electrocautery in its entirety and intact until the only structure connecting the gallbladder to the patient was the cystic duct.  Two 0 PDS Endoloops were placed and secured around the proximal cystic duct and the cystic duct/gallbladder neck junction was divided distal to these Endoloops sharply. Once freed the gallbladder was placed in an endocatch bag and removed intact through the epigastric trocar site. A small amount of bleeding on the liver bed was controlled with cautery. The right upper quadrant was irrigated with warm sterile saline; the effluent was clear. Hemostasis was once again confirmed, and reinspection of the abdomen revealed no injuries. The clips were well opposed without any bile leak from the duct or the liver bed.  Small amount of visible mucosa along the ligated gallbladder neck/cystic duct stump was ablated with cautery.  The 11mm trocar site in the epigastrium was closed with a 0 vicryl in the fascia under direct visualization using a PMI device. The abdomen was desufflated and all trocars removed. The skin incisions were closed with subcuticular 4-0 monocryl and Dermabond. The patient was awakened, extubated and transported to the recovery room in stable condition.    All counts were correct at the completion of the case.

## 2022-12-03 NOTE — Procedures (Signed)
Cortrak  Person Inserting Tube:  Chayce Rullo C, RD Tube Type:  Cortrak - 43 inches Tube Size:  10 Tube Location:  Left nare Secured by: Bridle Technique Used to Measure Tube Placement:  Marking at nare/corner of mouth Cortrak Secured At:  70 cm   Cortrak Tube Team Note:  Consult received to place a Cortrak feeding tube.   X-ray is required, abdominal x-ray has been ordered by the Cortrak team. Please confirm tube placement before using the Cortrak tube.   If the tube becomes dislodged please keep the tube and contact the Cortrak team at [email or web address] for replacement.  If after hours and replacement cannot be delayed, place a NG tube and confirm placement with an abdominal x-ray.   Roisin Mones P., RD, LDN, CNSC See AMiON for contact information    

## 2022-12-03 NOTE — Progress Notes (Signed)
Day of Surgery  Subjective: Uneventful night after ERCP with sphincterotomy.  Denies significant pain or nausea.  Objective: Vital signs in last 24 hours: Temp:  [97.1 F (36.2 C)-98.6 F (37 C)] 98.3 F (36.8 C) (05/31 0807) Pulse Rate:  [73-87] 79 (05/31 0807) Resp:  [13-18] 18 (05/31 0807) BP: (106-139)/(63-80) 106/80 (05/31 0807) SpO2:  [94 %-99 %] 99 % (05/31 0807) Weight:  [73.6 kg] 73.6 kg (05/31 0807) Last BM Date : 12/02/22  Intake/Output from previous day: 05/30 0701 - 05/31 0700 In: 300 [I.V.:250; IV Piggyback:50] Out: 0  Intake/Output this shift: No intake/output data recorded.  PE: Gen:  Alert, NAD, pleasant Abd: Soft, nontender, nondistended.    Lab Results:  Recent Labs    12/02/22 0248 12/03/22 0238  WBC 11.4* 11.1*  HGB 11.9* 12.0*  HCT 36.7* 37.2*  PLT 385 422*    BMET Recent Labs    12/02/22 0845 12/03/22 0238  NA 134* 135  K 4.2 5.3*  CL 100 100  CO2 26 27  GLUCOSE 161* 157*  BUN 13 15  CREATININE 0.96 1.07  CALCIUM 8.2* 8.9    PT/INR Recent Labs    12/02/22 0248  LABPROT 14.3  INR 1.1    CMP     Component Value Date/Time   NA 135 12/03/2022 0238   NA 138 05/12/2013 0759   K 5.3 (H) 12/03/2022 0238   K 3.7 05/12/2013 0759   CL 100 12/03/2022 0238   CL 105 05/12/2013 0759   CO2 27 12/03/2022 0238   CO2 25 05/12/2013 0759   GLUCOSE 157 (H) 12/03/2022 0238   GLUCOSE 199 (H) 05/12/2013 0759   BUN 15 12/03/2022 0238   BUN 18 05/12/2013 0759   CREATININE 1.07 12/03/2022 0238   CREATININE 1.08 05/12/2013 0759   CALCIUM 8.9 12/03/2022 0238   CALCIUM 8.8 05/12/2013 0759   PROT 7.2 12/03/2022 0238   PROT 8.0 05/12/2013 0759   ALBUMIN 2.6 (L) 12/03/2022 0238   ALBUMIN 4.0 05/12/2013 0759   AST 31 12/03/2022 0238   AST 22 05/12/2013 0759   ALT 30 12/03/2022 0238   ALT 32 05/12/2013 0759   ALKPHOS 64 12/03/2022 0238   ALKPHOS 79 05/12/2013 0759   BILITOT 0.7 12/03/2022 0238   BILITOT 1.0 05/12/2013 0759    GFRNONAA >60 12/03/2022 0238   GFRNONAA >60 05/12/2013 0759   GFRAA >60 12/07/2018 2227   GFRAA >60 05/12/2013 0759   Lipase     Component Value Date/Time   LIPASE 32 11/21/2022 0237    Studies/Results: DG ERCP  Result Date: 12/02/2022 CLINICAL DATA:  Gallstone pancreatitis. ERCP with sphincterotomy and balloon sweeps. EXAM: ERCP TECHNIQUE: Multiple spot images obtained with the fluoroscopic device and submitted for interpretation post-procedure. FLUOROSCOPY TIME: FLUOROSCOPY TIME 1 minute, 13 seconds (17.4 mGy) COMPARISON:  Abdominal MRI- 11/29/2022 FINDINGS: Three spot intraoperative fluoroscopic images of the right upper abdominal quadrant during ERCP are provided for review as well as an additional balloon cholangiogram. Initial image demonstrates an ERCP probe overlying the right upper abdominal quadrant. There is selective cannulation and opacification of the common bile duct with tapered narrowing at its distal aspect. Subsequent images demonstrate insufflation of a balloon within the central aspect of the CBD with subsequent biliary sweeping and balloon cholangiogram. There is opacification of the cystic duct with passage of contrast to the level of the gallbladder. Several filling defects are seen within the gallbladder lumen compatible with known cholelithiasis. There is minimal opacification of the intrahepatic  biliary tree which appears nondilated. There are no discrete filling defects within opacified portion of the biliary tree to suggest choledocholithiasis. There is no definitive opacification of the pancreatic duct. IMPRESSION: ERCP with biliary sweeping as detailed above. These images were submitted for radiologic interpretation only. Please see the procedural report for the amount of contrast and the fluoroscopy time utilized. Electronically Signed   By: Simonne Come M.D.   On: 12/02/2022 12:35    Anti-infectives: Anti-infectives (From admission, onward)    Start     Dose/Rate  Route Frequency Ordered Stop   11/25/22 1400  [MAR Hold]  piperacillin-tazobactam (ZOSYN) IVPB 3.375 g        (MAR Hold since Fri 12/03/2022 at 0805.Hold Reason: Transfer to a Procedural area)   3.375 g 12.5 mL/hr over 240 Minutes Intravenous Every 8 hours 11/25/22 1136     11/18/22 0600  piperacillin-tazobactam (ZOSYN) IVPB 3.375 g  Status:  Discontinued        3.375 g 12.5 mL/hr over 240 Minutes Intravenous Every 8 hours 11/17/22 1753 11/24/22 0949   11/17/22 1845  piperacillin-tazobactam (ZOSYN) IVPB 3.375 g        3.375 g 100 mL/hr over 30 Minutes Intravenous  Once 11/17/22 1753 11/17/22 2012        Assessment/Plan Gallstone pancreatitis  Choledocholithiasis with elevated LFT's -Status post successful ERCP with sphincterotomy 5/30, Dr. Meridee Score - MRCP 5/21 with choledocholithiasis and concern for possible developing pseudocyst  -Clinically it seems his pancreatitis is resolving appropriately and at this point I think it is reasonable to proceed with laparoscopic cholecystectomy.  I went over the relevant anatomy, surgical technique, and risks of surgery including bleeding, pain, scarring, intraabdominal injury specifically to the common bile duct and sequelae, bile leak, conversion to open surgery, subtotal cholecystectomy, blood clot, pneumonia, heart attack, stroke, failure to resolve symptoms, etc with the patient and his wife. Questions welcomed and answered.  He wishes to proceed and we will do so this morning.    FEN - Low fat diet , IVF per [facility] VTE - SCDs, LMWH  ID - Zosyn per [facility] (see above)  I reviewed Consultant (GI) notes, hospitalist notes, last 24 h vitals and pain scores, last 48 h intake and output, and last 24 h labs and trends.   LOS: 16 days    [name] ,MD [facility] Surgery 12/03/2022, 8:24 AM Please see Amion for pager number during day hours 7:00am-4:30pm

## 2022-12-03 NOTE — Anesthesia Preprocedure Evaluation (Signed)
Anesthesia Evaluation  Patient identified by MRN, date of birth, ID band Patient awake    Reviewed: Allergy & Precautions, NPO status , Patient's Chart, lab work & pertinent test results  History of Anesthesia Complications Negative for: history of anesthetic complications  Airway Mallampati: II  TM Distance: >3 FB Neck ROM: Full    Dental  (+) Edentulous Upper, Dental Advisory Given   Pulmonary neg pulmonary ROS   breath sounds clear to auscultation       Cardiovascular (-) angina + CAD, + Past MI, + Cardiac Stents and + Peripheral Vascular Disease  + dysrhythmias Atrial Fibrillation  Rhythm:Regular Rate:Normal     Neuro/Psych negative neurological ROS     GI/Hepatic ,GERD  Medicated and Controlled,,Gallstone pancreatitis N/v with gallstone pancreatitis   Endo/Other  diabetes, Well Controlled, Type 2    Renal/GU Renal Insufficiency and CRFRenal disease     Musculoskeletal   Abdominal   Peds  Hematology negative hematology ROS (+)   Anesthesia Other Findings   Reproductive/Obstetrics                             Anesthesia Physical Anesthesia Plan  ASA: 3  Anesthesia Plan: General   Post-op Pain Management: Minimal or no pain anticipated, Ofirmev IV (intra-op)* and Toradol IV (intra-op)*   Induction: Intravenous  PONV Risk Score and Plan: 2 and Ondansetron and Dexamethasone  Airway Management Planned: Oral ETT  Additional Equipment: None  Intra-op Plan:   Post-operative Plan: Extubation in OR  Informed Consent: I have reviewed the patients History and Physical, chart, labs and discussed the procedure including the risks, benefits and alternatives for the proposed anesthesia with the patient or authorized representative who has indicated his/her understanding and acceptance.     Dental advisory given  Plan Discussed with: CRNA and Anesthesiologist  Anesthesia Plan  Comments:         Anesthesia Quick Evaluation

## 2022-12-03 NOTE — Transfer of Care (Signed)
Immediate Anesthesia Transfer of Care Note  Patient: Jon Bray  Procedure(s) Performed: LAPAROSCOPIC CHOLECYSTECTOMY (Abdomen)  Patient Location: PACU  Anesthesia Type:General  Level of Consciousness: drowsy and patient cooperative  Airway & Oxygen Therapy: Patient Spontanous Breathing and Patient connected to nasal cannula oxygen  Post-op Assessment: Report given to RN and Post -op Vital signs reviewed and stable  Post vital signs: Reviewed and stable  Last Vitals:  Vitals Value Taken Time  BP 145/77 12/03/22 1042  Temp    Pulse 91 12/03/22 1044  Resp 17 12/03/22 1044  SpO2 96 % 12/03/22 1044  Vitals shown include unvalidated device data.  Last Pain:  Vitals:   12/03/22 0819  TempSrc:   PainSc: 0-No pain      Patients Stated Pain Goal: 3 (12/01/22 1548)  Complications: No notable events documented.

## 2022-12-03 NOTE — Evaluation (Signed)
Occupational Therapy Evaluation Patient Details Name: Jon Bray MRN: [id number] DOB: February 25, 1941 Today's Date: 12/03/2022   History of Present Illness Jon Bray is a 82 y.o. male hospitalized with necrotizing/hemorrhagic pancreatitis and previously failed ERCP and now status post biliary sphincterotomy and stone extraction and now status post cholecystectomy. PMH significant for CAD, hypertension, atrial fibrillation, hyperlipidemia, chronic renal insufficiency, nephrolithiasis, cholelithiasis, gallstone pancreatitis with retained biliary stone.   Clinical Impression   Pt admitted with the above diagnosis. Pt currently with functional limitations due to the deficits listed below (see OT Problem List). Prior to admit, pt was independent with all ADL and IADL tasks including functional mobility. Enjoys being active and plays golf 2X/week. Pt will benefit from acute skilled OT to increase their safety and independence with ADL and functional mobility for ADL to facilitate discharge. Do not recommend any follow up OT services or DME needs. OT will continue to follow patient acutely and focus on activity tolerance and endurance.        Recommendations for follow up therapy are one component of a multi-disciplinary discharge planning process, led by the attending physician.  Recommendations may be updated based on patient status, additional functional criteria and insurance authorization.   Assistance Recommended at Discharge PRN  Patient can return home with the following Assistance with cooking/housework;Assist for transportation    Functional Status Assessment  Patient has had a recent decline in their functional status and demonstrates the ability to make significant improvements in function in a reasonable and predictable amount of time.  Equipment Recommendations  None recommended by OT       Precautions / Restrictions Precautions Precautions: Fall Precaution Comments:  Cortrak Restrictions Weight Bearing Restrictions: No      Mobility Bed Mobility Overal bed mobility: Needs Assistance Bed Mobility: Supine to Sit     Supine to sit: HOB elevated, Min assist     General bed mobility comments: pt using bed features to sit up EOB, min assist due to abdominal pain Patient Response: Cooperative  Transfers Overall transfer level: Needs assistance Equipment used: None Transfers: Sit to/from Stand Sit to Stand: Supervision           General transfer comment: pt using bil UE to power up from EOB      Balance Overall balance assessment: No apparent balance deficits (not formally assessed)        ADL either performed or assessed with clinical judgement   ADL Overall ADL's : Modified independent    General ADL Comments: requires increased time to complete due to pain level. Pain may limit ability to complete without set up at times.     Vision Baseline Vision/History: 1 Wears glasses Ability to See in Adequate Light: 0 Adequate Patient Visual Report: No change from baseline Vision Assessment?: No apparent visual deficits            Pertinent Vitals/Pain Pain Assessment Pain Assessment: Faces Faces Pain Scale: Hurts little more Pain Location: abdomen Pain Descriptors / Indicators: Aching, Discomfort, Guarding, Grimacing Pain Intervention(s): Limited activity within patient's tolerance, Monitored during session, Repositioned, Patient requesting pain meds-RN notified     Hand Dominance Right   Extremity/Trunk Assessment Upper Extremity Assessment Upper Extremity Assessment: Overall WFL for tasks assessed   Lower Extremity Assessment Lower Extremity Assessment: Overall WFL for tasks assessed   Cervical / Trunk Assessment Cervical / Trunk Assessment: Normal   Communication Communication Communication: No difficulties (voice raspy due to sore throat)   Cognition Arousal/Alertness: Awake/alert Behavior During Therapy:  WFL for  tasks assessed/performed Overall Cognitive Status: Within Functional Limits for tasks assessed                         Home Living Family/patient expects to be discharged to:: Private residence Living Arrangements: Spouse/significant other Available Help at Discharge: Family Type of Home: House Home Access: Stairs to enter Secretary/administrator of Steps: 1+1 Entrance Stairs-Rails: None Home Layout: One level           Bathroom Accessibility: Yes   Home Equipment: None          Prior Functioning/Environment Prior Level of Function : Independent/Modified Independent             Mobility Comments: pt indpendent, very active with house/yard work, Sales executive 2x/week ADLs Comments: independent        OT Problem List: Decreased activity tolerance      OT Treatment/Interventions: Self-care/ADL training;Therapeutic exercise;Therapeutic activities;Energy conservation;Patient/family education    OT Goals(Current goals can be found in the care plan section) Acute Rehab OT Goals Patient Stated Goal: to go golfing OT Goal Formulation: Patient unable to participate in goal setting Time For Goal Achievement: 12/17/22 Potential to Achieve Goals: Good  OT Frequency: Min 2X/week    Co-evaluation PT/OT/SLP Co-Evaluation/Treatment: Yes Reason for Co-Treatment: To address functional/ADL transfers   OT goals addressed during session: ADL's and self-care;Strengthening/ROM      AM-PAC OT "6 Clicks" Daily Activity     Outcome Measure Help from another person eating meals?: Total (cortrak) Help from another person taking care of personal grooming?: None Help from another person toileting, which includes using toliet, bedpan, or urinal?: None Help from another person bathing (including washing, rinsing, drying)?: None Help from another person to put on and taking off regular upper body clothing?: None Help from another person to put on and taking off regular lower body  clothing?: None 6 Click Score: 21   End of Session    Activity Tolerance: Patient tolerated treatment well Patient left: in chair;with call bell/phone within reach;with family/visitor present  OT Visit Diagnosis: Muscle weakness (generalized) (M62.81)                Time: 1610-9604 OT Time Calculation (min): 16 min Charges:  OT General Charges $OT Visit: 1 Visit OT Evaluation $OT Eval Low Complexity: 1 Low  [employer], OTR/L,CBIS  Supplemental OT - [facility] and [facility] Secure Chat Preferred    Safiyah Cisney, Charisse March 12/03/2022, 4:10 PM

## 2022-12-03 NOTE — Plan of Care (Signed)
  Problem: Education: Goal: Knowledge of General Education information will improve Description: Including pain rating scale, medication(s)/side effects and non-pharmacologic comfort measures Outcome: Progressing   Problem: Health Behavior/Discharge Planning: Goal: Ability to manage health-related needs will improve Outcome: Progressing   Problem: Clinical Measurements: Goal: Ability to maintain clinical measurements within normal limits will improve Outcome: Progressing Goal: Will remain free from infection Outcome: Progressing Goal: Diagnostic test results will improve Outcome: Progressing Goal: Respiratory complications will improve Outcome: Progressing Goal: Cardiovascular complication will be avoided Outcome: Progressing   Problem: Activity: Goal: Risk for activity intolerance will decrease Outcome: Progressing   Problem: Nutrition: Goal: Adequate nutrition will be maintained Outcome: Progressing   Problem: Coping: Goal: Level of anxiety will decrease Outcome: Progressing   Problem: Elimination: Goal: Will not experience complications related to bowel motility Outcome: Progressing Goal: Will not experience complications related to urinary retention Outcome: Progressing   Problem: Pain Managment: Goal: General experience of comfort will improve Outcome: Progressing   Problem: Safety: Goal: Ability to remain free from injury will improve Outcome: Progressing   Problem: Skin Integrity: Goal: Risk for impaired skin integrity will decrease Outcome: Progressing   Problem: Education: Goal: Knowledge of Pancreatitis treatment and prevention will improve Outcome: Progressing   Problem: Health Behavior/Discharge Planning: Goal: Ability to formulate a plan to maintain an alcohol-free life will improve Outcome: Progressing   Problem: Nutritional: Goal: Ability to achieve adequate nutritional intake will improve Outcome: Progressing   Problem: Clinical  Measurements: Goal: Complications related to the disease process, condition or treatment will be avoided or minimized Outcome: Progressing   Problem: Education: Goal: Ability to describe self-care measures that may prevent or decrease complications (Diabetes Survival Skills Education) will improve Outcome: Progressing Goal: Individualized Educational Video(s) Outcome: Progressing   Problem: Coping: Goal: Ability to adjust to condition or change in health will improve Outcome: Progressing   Problem: Fluid Volume: Goal: Ability to maintain a balanced intake and output will improve Outcome: Progressing   Problem: Health Behavior/Discharge Planning: Goal: Ability to identify and utilize available resources and services will improve Outcome: Progressing Goal: Ability to manage health-related needs will improve Outcome: Progressing   Problem: Metabolic: Goal: Ability to maintain appropriate glucose levels will improve Outcome: Progressing   Problem: Nutritional: Goal: Maintenance of adequate nutrition will improve Outcome: Progressing Goal: Progress toward achieving an optimal weight will improve Outcome: Progressing   Problem: Skin Integrity: Goal: Risk for impaired skin integrity will decrease Outcome: Progressing   Problem: Tissue Perfusion: Goal: Adequacy of tissue perfusion will improve Outcome: Progressing

## 2022-12-03 NOTE — Progress Notes (Signed)
OT Cancellation Note  Patient Details Name: Jon Bray MRN: [id number] DOB: March 05, 1941   Cancelled Treatment:    Reason Eval/Treat Not Completed: Patient at procedure or test/ unavailable (Pt off unit for laparoscopic cholecystectomy. OT will attempt to see pt later this day as pt is appropriate and as schedule allows.)  [name] "[name]" M., OTR/L, MA Acute Rehab [phone]   [name] 12/03/2022, 8:49 AM

## 2022-12-03 NOTE — Anesthesia Postprocedure Evaluation (Signed)
Anesthesia Post Note  Patient: Jon Bray  Procedure(s) Performed: LAPAROSCOPIC CHOLECYSTECTOMY (Abdomen)     Patient location during evaluation: PACU Anesthesia Type: General Level of consciousness: awake and alert Pain management: pain level controlled Vital Signs Assessment: post-procedure vital signs reviewed and stable Respiratory status: spontaneous breathing, nonlabored ventilation, respiratory function stable and patient connected to nasal cannula oxygen Cardiovascular status: blood pressure returned to baseline and stable Postop Assessment: no apparent nausea or vomiting Anesthetic complications: no   No notable events documented.  Last Vitals:  Vitals:   12/03/22 1115 12/03/22 1130  BP: 131/75 131/78  Pulse: 85 85  Resp: 18 19  Temp:  (!) 36.4 C  SpO2: 95% 95%    Last Pain:  Vitals:   12/03/22 1130  TempSrc:   PainSc: Asleep                 Endy Easterly

## 2022-12-03 NOTE — Evaluation (Signed)
Physical Therapy Evaluation Patient Details Name: [facility] MRN: [id number] DOB: 17-Sep-1940 Today's Date: 12/03/2022  History of Present Illness  Mr. Eckenrod is a 82 y.o. male hospitalized with necrotizing/hemorrhagic pancreatitis and previously failed ERCP and now status post biliary sphincterotomy and stone extraction and now status post cholecystectomy. PMH significant for CAD, hypertension, atrial fibrillation, hyperlipidemia, chronic renal insufficiency, nephrolithiasis, cholelithiasis, gallstone pancreatitis with retained biliary stone.   Clinical Impression  [facility] is 82 y.o. male admitted with above HPI and diagnosis. Patient is currently limited by functional impairments below (see PT problem list). Patient lives with spouse and is independent and very active at baseline. Currently pt limited by abdominal pain and is more guarded with bed mob, transfers, and gait. He is mobilizing at min assist to supervision level with no AD needed for gait. Patient will benefit from continued skilled PT interventions to address impairments and progress independence with mobility. Acute PT will follow and progress as able.        Recommendations for follow up therapy are one component of a multi-disciplinary discharge planning process, led by the attending physician.  Recommendations may be updated based on patient status, additional functional criteria and insurance authorization.  Follow Up Recommendations       Assistance Recommended at Discharge Intermittent Supervision/Assistance  Patient can return home with the following  A little help with walking and/or transfers;A little help with bathing/dressing/bathroom;Assistance with cooking/housework;Assist for transportation;Help with stairs or ramp for entrance    Equipment Recommendations None recommended by PT  Recommendations for Other Services       Functional Status Assessment Patient has had a recent decline in their functional status  and demonstrates the ability to make significant improvements in function in a reasonable and predictable amount of time.     Precautions / Restrictions Precautions Precautions: Fall Precaution Comments: Cortrak Restrictions Weight Bearing Restrictions: No      Mobility  Bed Mobility Overal bed mobility: Needs Assistance Bed Mobility: Supine to Sit     Supine to sit: HOB elevated, Min assist     General bed mobility comments: pt using bed features to sit up EOB, min assist due to abdinal pain    Transfers Overall transfer level: Needs assistance Equipment used: None Transfers: Sit to/from Stand Sit to Stand: Supervision           General transfer comment: pt using bil UE to power up from EOB    Ambulation/Gait Ambulation/Gait assistance: Min guard Gait Distance (Feet): 300 Feet Assistive device: None Gait Pattern/deviations: Step-through pattern, Decreased stride length, Trunk flexed Gait velocity: decr/fair     General Gait Details: pt with flexed posture due to abdominal discomfort, overall steady with HHA and no UE support. pt bracing hand on Rt side intermittently for pain control.  Stairs            Wheelchair Mobility    Modified Rankin (Stroke Patients Only)       Balance Overall balance assessment: No apparent balance deficits (not formally assessed) (pt holding golf club and maknig small putting motion)                                           Pertinent Vitals/Pain Pain Assessment Pain Assessment: Faces Faces Pain Scale: Hurts little more Pain Location: abdomen Pain Descriptors / Indicators: Aching, Discomfort, Guarding Pain Intervention(s): Limited activity within patient's tolerance,  Monitored during session, Repositioned    Home Living Family/patient expects to be discharged to:: Private residence Living Arrangements: Spouse/significant other Available Help at Discharge: Family Type of Home: House Home  Access: Stairs to enter Entrance Stairs-Rails: None Secretary/administrator of Steps: 1+1     Home Equipment: None      Prior Function Prior Level of Function : Independent/Modified Independent             Mobility Comments: pt indpendent, very active with house/yard work, Sales executive 2x/week ADLs Comments: independent     Hand Dominance   Dominant Hand: Right    Extremity/Trunk Assessment   Upper Extremity Assessment Upper Extremity Assessment: Overall WFL for tasks assessed    Lower Extremity Assessment Lower Extremity Assessment: Overall WFL for tasks assessed    Cervical / Trunk Assessment Cervical / Trunk Assessment: Normal  Communication   Communication: No difficulties (voice raspy due to sore throat)  Cognition Arousal/Alertness: Awake/alert Behavior During Therapy: WFL for tasks assessed/performed Overall Cognitive Status: Within Functional Limits for tasks assessed                                          General Comments      Exercises     Assessment/Plan    PT Assessment Patient needs continued PT services  PT Problem List Decreased activity tolerance;Decreased balance;Decreased mobility;Pain       PT Treatment Interventions DME instruction;Gait training;Stair training;Functional mobility training;Therapeutic activities;Therapeutic exercise;Balance training;Neuromuscular re-education;Patient/family education    PT Goals (Current goals can be found in the Care Plan section)  Acute Rehab PT Goals Patient Stated Goal: get better and home PT Goal Formulation: With patient/family Time For Goal Achievement: 12/17/22 Potential to Achieve Goals: Good    Frequency Min 3X/week     Co-evaluation               AM-PAC PT "6 Clicks" Mobility  Outcome Measure Help needed turning from your back to your side while in a flat bed without using bedrails?: A Little Help needed moving from lying on your back to sitting on the side of a  flat bed without using bedrails?: A Little Help needed moving to and from a bed to a chair (including a wheelchair)?: A Little Help needed standing up from a chair using your arms (e.g., wheelchair or bedside chair)?: A Little Help needed to walk in hospital room?: A Little Help needed climbing 3-5 steps with a railing? : A Little 6 Click Score: 18    End of Session Equipment Utilized During Treatment: Gait belt Activity Tolerance: Patient tolerated treatment well Patient left: in chair;with call bell/phone within reach;with family/visitor present Nurse Communication: Mobility status PT Visit Diagnosis: Muscle weakness (generalized) (M62.81);Difficulty in walking, not elsewhere classified (R26.2);Pain Pain - Right/Left: Right Pain - part of body:  (abdomen)    Time: 1610-9604 PT Time Calculation (min) (ACUTE ONLY): 18 min   Charges:   PT Evaluation $PT Eval Low Complexity: 1 Low          Wynn Maudlin, DPT Acute Rehabilitation Services Office [phone]  12/03/22 3:59 PM

## 2022-12-03 NOTE — Anesthesia Procedure Notes (Signed)
Procedure Name: Intubation Date/Time: 12/03/2022 9:22 AM  Performed by: Waynard Edwards, CRNAPre-anesthesia Checklist: Patient identified, Emergency Drugs available, Suction available and Patient being monitored Patient Re-evaluated:Patient Re-evaluated prior to induction Oxygen Delivery Method: Circle system utilized Preoxygenation: Pre-oxygenation with 100% oxygen Induction Type: IV induction Ventilation: Mask ventilation without difficulty Laryngoscope Size: Miller and 3 Grade View: Grade I Tube type: Oral Tube size: 7.5 mm Number of attempts: 1 Airway Equipment and Method: Stylet Placement Confirmation: ETT inserted through vocal cords under direct vision, positive ETCO2 and breath sounds checked- equal and bilateral Secured at: 22 cm Tube secured with: Tape Dental Injury: Teeth and Oropharynx as per pre-operative assessment

## 2022-12-03 NOTE — Discharge Instructions (Signed)

## 2022-12-03 NOTE — Progress Notes (Signed)
Nutrition Follow-up  DOCUMENTATION CODES:  Not applicable  INTERVENTION:  Tube feeds via NG-tube: Start Osmolite 1.5 at 25 mL/hr, advance by 10 mL q4h to goal rate of 55 mL/hr (1320 mL per day) 60 mL ProSource TF20 - Daily Free water flush: 150 mL q4h Tube feeds at goal provides 2060 kcal, 103 gm protein, and 1906 mL total free water daily.  Continue Boost Breeze po TID, each supplement provides 250 kcal and 9 grams of protein  NUTRITION DIAGNOSIS:  Inadequate oral intake related to acute illness as evidenced by per patient/family report. - Ongoing   GOAL:  Patient will meet greater than or equal to 90% of their needs - Being meet via TF  MONITOR:  PO intake, Supplement acceptance, Labs, TF tolerance  REASON FOR ASSESSMENT:   Consult Enteral/tube feeding initiation and management  ASSESSMENT:   82 y.o. male presented to [facility] with abdominal pain, nausea, and vomiting for 3 days. PMH includes GERD, HLD, T2DM, and CAD. Pt admitted with acute gallstone pancreatitis.   5/15 - transferred to [facility]; Admitted 5/18 - diet advanced to full liquids 5/20 - NPO; s/p ERCP (unsuccessful); diet advanced to clear liquids 5/21 - diet advanced to heart healthy 5/27 - NG-tube placed (tip gastric) 5/28 - TF started 5/30 - ERCP; diet advanced to full liquids 5/31 - Op, s/p lap cholecystectomy; diet advanced to clear liquids; Cortrak placed (tip gastric)  Met with pt wife in room. Expressed that pt had ate very well for dinner last night; pt ate soup, pudding, and fruit ice. Reports that pt has been drinking the Boost breeze. Wife asking about diet after discharge, encourage sticking with a low fat diet and as he heals slowing incorporating fat back into diet.  RD returned to room after pt returned. Pt reporting some pain in side. Currently on clear liquid diet, sipping on ginger ale.   Reached out to team, MD ok with restarting tube feeds.   Medications reviewed and include: NovoLog SSI,  Protonix, Senokot-S, IV antibiotics  Labs reviewed: Sodium 132, Potassium 3.8, Phosphorus 2.7, Magnesium 2.2,Folate 15.8, Vitamin B12 848 CBG: 134-189 x 24 hours   Diet Order:   Diet Order             Diet clear liquid Fluid consistency: Thin  Diet effective now                  EDUCATION NEEDS:   Education needs have been addressed  Skin:  Skin Assessment: Reviewed RN Assessment  Last BM:  5/30  Height:  Ht Readings from Last 1 Encounters:  12/03/22 5\' 8"  (1.727 m)   Weight:  Wt Readings from Last 1 Encounters:  12/03/22 73.6 kg   Ideal Body Weight:  70 kg  BMI:  Body mass index is 24.67 kg/m.  Estimated Nutritional Needs:  Kcal:  1900-2100 Protein:  95-115 grams Fluid:  >/= 1.9 L   Kirby Crigler RD, LDN Clinical Dietitian See [facility] for contact information.

## 2022-12-03 NOTE — Progress Notes (Signed)
Gastroenterology Inpatient Follow-up Note   PATIENT IDENTIFICATION  [facility] is a 82 y.o. male with a pmh significant for CAD, hypertension, atrial fibrillation, hyperlipidemia, chronic renal insufficiency, nephrolithiasis, cholelithiasis, gallstone pancreatitis with retained biliary stone.  Still hospitalized with necrotizing/hemorrhagic pancreatitis and previously failed ERCP and now status post biliary sphincterotomy and stone extraction and now status post cholecystectomy. Hospital Day: 63  SUBJECTIVE  The patient is actually seen in follow-up today in the postprocedural recovery area (PACU). Looks like Dr. Doylene Canard was able to proceed with cholecystectomy this morning, operative note pending at this point. Patient is waking up but feels okay with some mild discomfort in the abdomen. He states he was feeling quite well this morning.   OBJECTIVE  Scheduled Inpatient Medications:   [MAR Hold] feeding supplement  1 Container Oral TID BM   [MAR Hold] feeding supplement (PROSource TF20)  60 mL Per Tube Daily   [MAR Hold] free water  150 mL Per Tube Q4H   [MAR Hold] guaiFENesin  600 mg Oral BID   [MAR Hold] hydrocortisone cream   Topical QID   [MAR Hold] insulin aspart  0-9 Units Subcutaneous Q4H   [MAR Hold] pantoprazole (PROTONIX) IV  40 mg Intravenous Q24H   [MAR Hold] senna-docusate  1 tablet Oral QHS   [MAR Hold] sodium chloride  1 drop Both Eyes QHS   Continuous Inpatient Infusions:   feeding supplement (OSMOLITE 1.5 CAL) Stopped (12/01/22 2347)   lactated ringers 10 mL/hr at 12/03/22 0911   [MAR Hold] methocarbamol (ROBAXIN) IV     [MAR Hold] piperacillin-tazobactam (ZOSYN)  IV 3.375 g (12/03/22 0821)   PRN Inpatient Medications: [MAR Hold] acetaminophen **OR** [MAR Hold] acetaminophen, [MAR Hold] bisacodyl, fentaNYL (SUBLIMAZE) injection, [MAR Hold]  HYDROmorphone (DILAUDID) injection, [MAR Hold] methocarbamol (ROBAXIN) IV, midazolam, [MAR Hold] ondansetron **OR** [MAR  Hold] ondansetron (ZOFRAN) IV, [MAR Hold] oxyCODONE, oxyCODONE **OR** oxyCODONE, [MAR Hold] phenol, [MAR Hold] polyethylene glycol, [MAR Hold] polyvinyl alcohol, promethazine, [MAR Hold] simethicone   Physical Examination  Temp:  [97.5 F (36.4 C)-98.6 F (37 C)] 97.5 F (36.4 C) (05/31 1043) Pulse Rate:  [73-91] 91 (05/31 1045) Resp:  [13-20] 17 (05/31 1045) BP: (106-145)/(63-80) 139/75 (05/31 1045) SpO2:  [94 %-99 %] 96 % (05/31 1045) Weight:  [73.6 kg] 73.6 kg (05/31 0807) Temp (24hrs), Avg:98.1 F (36.7 C), Min:97.5 F (36.4 C), Max:98.6 F (37 C)  Weight: 73.6 kg GEN: Resting, just waking up from his cholecystectomy PSYCH: Cooperative, without pressured speech EYE: Conjunctivae pink, sclerae anicteric ENT: Dry mucous membranes CV: Nontachycardic RESP: No audible wheezing GI: Did not evaluate abdomen since he just had surgery MSK/EXT: No significant lower extremity edema SKIN: No jaundice NEURO:  Alert & Oriented x 3, no focal deficits   Review of Data   Laboratory Studies   Recent Labs  Lab 12/01/22 0529 12/01/22 1710 12/02/22 0248 12/03/22 0238  NA 131*  --    < > 135  K 3.8  --    < > 5.3*  CL 96*  --    < > 100  CO2 24  --    < > 27  BUN 15  --    < > 15  CREATININE 0.91  --    < > 1.07  GLUCOSE 253*  --    < > 157*  CALCIUM 8.0*  --    < > 8.9  MG 2.0 2.0  --  2.2  PHOS 3.2 2.6  --  2.7   < > =  values in this interval not displayed.   Recent Labs  Lab 12/03/22 0238  AST 31  ALT 30  ALKPHOS 64    Recent Labs  Lab 12/01/22 0529 12/02/22 0248 12/03/22 0238  WBC 11.9* 11.4* 11.1*  HGB 12.0* 11.9* 12.0*  HCT 36.2* 36.7* 37.2*  PLT 358 385 422*   Recent Labs  Lab 12/02/22 0248  INR 1.1   Imaging Studies  DG ERCP  Result Date: 12/02/2022 CLINICAL DATA:  Gallstone pancreatitis. ERCP with sphincterotomy and balloon sweeps. EXAM: ERCP TECHNIQUE: Multiple spot images obtained with the fluoroscopic device and submitted for interpretation  post-procedure. FLUOROSCOPY TIME: FLUOROSCOPY TIME 1 minute, 13 seconds (17.4 mGy) COMPARISON:  Abdominal MRI- 11/29/2022 FINDINGS: Three spot intraoperative fluoroscopic images of the right upper abdominal quadrant during ERCP are provided for review as well as an additional balloon cholangiogram. Initial image demonstrates an ERCP probe overlying the right upper abdominal quadrant. There is selective cannulation and opacification of the common bile duct with tapered narrowing at its distal aspect. Subsequent images demonstrate insufflation of a balloon within the central aspect of the CBD with subsequent biliary sweeping and balloon cholangiogram. There is opacification of the cystic duct with passage of contrast to the level of the gallbladder. Several filling defects are seen within the gallbladder lumen compatible with known cholelithiasis. There is minimal opacification of the intrahepatic biliary tree which appears nondilated. There are no discrete filling defects within opacified portion of the biliary tree to suggest choledocholithiasis. There is no definitive opacification of the pancreatic duct. IMPRESSION: ERCP with biliary sweeping as detailed above. These images were submitted for radiologic interpretation only. Please see the procedural report for the amount of contrast and the fluoroscopy time utilized. Electronically Signed   By: Simonne Come M.D.   On: 12/02/2022 12:35    GI Procedures and Studies  ERCP - J-shaped gastric deformity. - Gastritis in antrum. No other gross lesions in the entire stomach. Biopsied for H. pylori. - No gross lesions in the duodenal bulb, in the first portion of the duodenum and in the second portion of the duodenum. - The major papilla appeared normal (though hidden under multiple folds). - The fluoroscopic examination was suspicious for sludge. - Choledocholithiasis was found. Complete removal was accomplished by biliary sphincterotomy and balloon sweeping. - The very  distal biliary duct had a slight narrowing compared to the majority of the bile duct, though this is not clearly a stricture and the balloon could not pass through this area with ease after stone extraction..   ASSESSMENT  [facility] is a 82 y.o. male with a pmh significant for CAD, hypertension, atrial fibrillation, hyperlipidemia, chronic renal insufficiency, nephrolithiasis, cholelithiasis, gallstone pancreatitis with retained biliary stone.  Still hospitalized with necrotizing/hemorrhagic pancreatitis and previously failed ERCP and now status post biliary sphincterotomy and stone extraction and now status post cholecystectomy.  Patient is hemodynamically and clinically stable today. I am quite happy that he was well enough for [place] to move forward with cholecystectomy, I look forward to reading the operative note and appreciate Dr. Derrill Memo assistance with this. Pending how he is doing from a cholecystectomy standpoint, hopefully his nutrition will be able to be maintained.  Hopefully he can get his core track placed this afternoon since they only do them on Mondays and Wednesdays and Fridays.  If not NG tube feeding may be reasonable again, for [place] to give an opportunity to allow the pancreas a bit more time to heal and have less activation with  oral intake. Will see how the patient is doing tomorrow.   PLAN/RECOMMENDATIONS  Recommend core track be placed today if possible if not NG tube for continued tube feeds Full liquid diet or small snacks of a heart healthy standpoint are reasonable for at least another 1 to 2 days while we allow tube feeds to give his body of the nutrients/calories it needs to heal the pancreatitis and now cholecystectomy Trend LFTs Likely plan repeat imaging 1 week from last MRI with a pancreas protocol CT abdomen Will see in follow-up tomorrow   Please page/call with questions or concerns.   Corliss Parish, MD [facility] Office  # [phone]    LOS: 16 days  [facility]  12/03/2022, 10:51 AM

## 2022-12-04 ENCOUNTER — Encounter (HOSPITAL_COMMUNITY): Payer: Self-pay | Admitting: Surgery

## 2022-12-04 DIAGNOSIS — Z9889 Other specified postprocedural states: Secondary | ICD-10-CM | POA: Diagnosis not present

## 2022-12-04 DIAGNOSIS — K219 Gastro-esophageal reflux disease without esophagitis: Secondary | ICD-10-CM | POA: Diagnosis not present

## 2022-12-04 DIAGNOSIS — E785 Hyperlipidemia, unspecified: Secondary | ICD-10-CM | POA: Diagnosis not present

## 2022-12-04 DIAGNOSIS — K851 Biliary acute pancreatitis without necrosis or infection: Secondary | ICD-10-CM | POA: Diagnosis not present

## 2022-12-04 DIAGNOSIS — K8050 Calculus of bile duct without cholangitis or cholecystitis without obstruction: Secondary | ICD-10-CM | POA: Diagnosis not present

## 2022-12-04 DIAGNOSIS — K5900 Constipation, unspecified: Secondary | ICD-10-CM | POA: Diagnosis not present

## 2022-12-04 DIAGNOSIS — K859 Acute pancreatitis without necrosis or infection, unspecified: Secondary | ICD-10-CM | POA: Diagnosis not present

## 2022-12-04 LAB — CBC WITH DIFFERENTIAL/PLATELET
Abs Immature Granulocytes: 0.05 10*3/uL (ref 0.00–0.07)
Basophils Absolute: 0.1 10*3/uL (ref 0.0–0.1)
Basophils Relative: 1 %
Eosinophils Absolute: 0.1 10*3/uL (ref 0.0–0.5)
Eosinophils Relative: 1 %
HCT: 34.8 % — ABNORMAL LOW (ref 39.0–52.0)
Hemoglobin: 11.3 g/dL — ABNORMAL LOW (ref 13.0–17.0)
Immature Granulocytes: 0 %
Lymphocytes Relative: 30 %
Lymphs Abs: 3.7 10*3/uL (ref 0.7–4.0)
MCH: 28.6 pg (ref 26.0–34.0)
MCHC: 32.5 g/dL (ref 30.0–36.0)
MCV: 88.1 fL (ref 80.0–100.0)
Monocytes Absolute: 1 10*3/uL (ref 0.1–1.0)
Monocytes Relative: 8 %
Neutro Abs: 7.7 10*3/uL (ref 1.7–7.7)
Neutrophils Relative %: 60 %
Platelets: 373 10*3/uL (ref 150–400)
RBC: 3.95 MIL/uL — ABNORMAL LOW (ref 4.22–5.81)
RDW: 14.2 % (ref 11.5–15.5)
WBC: 12.6 10*3/uL — ABNORMAL HIGH (ref 4.0–10.5)
nRBC: 0 % (ref 0.0–0.2)

## 2022-12-04 LAB — COMPREHENSIVE METABOLIC PANEL
ALT: 41 U/L (ref 0–44)
AST: 46 U/L — ABNORMAL HIGH (ref 15–41)
Albumin: 2.4 g/dL — ABNORMAL LOW (ref 3.5–5.0)
Alkaline Phosphatase: 54 U/L (ref 38–126)
Anion gap: 9 (ref 5–15)
BUN: 15 mg/dL (ref 8–23)
CO2: 25 mmol/L (ref 22–32)
Calcium: 8.1 mg/dL — ABNORMAL LOW (ref 8.9–10.3)
Chloride: 100 mmol/L (ref 98–111)
Creatinine, Ser: 0.94 mg/dL (ref 0.61–1.24)
GFR, Estimated: >60 mL/min (ref >60)
Glucose, Bld: 230 mg/dL — ABNORMAL HIGH (ref 70–99)
Potassium: 3.8 mmol/L (ref 3.5–5.1)
Sodium: 134 mmol/L — ABNORMAL LOW (ref 135–145)
Total Bilirubin: 0.6 mg/dL (ref 0.3–1.2)
Total Protein: 6.3 g/dL — ABNORMAL LOW (ref 6.5–8.1)

## 2022-12-04 LAB — GLUCOSE, CAPILLARY
Glucose-Capillary: 103 mg/dL — ABNORMAL HIGH (ref 70–99)
Glucose-Capillary: 149 mg/dL — ABNORMAL HIGH (ref 70–99)
Glucose-Capillary: 160 mg/dL — ABNORMAL HIGH (ref 70–99)
Glucose-Capillary: 172 mg/dL — ABNORMAL HIGH (ref 70–99)
Glucose-Capillary: 213 mg/dL — ABNORMAL HIGH (ref 70–99)
Glucose-Capillary: 259 mg/dL — ABNORMAL HIGH (ref 70–99)

## 2022-12-04 LAB — PHOSPHORUS: Phosphorus: 3 mg/dL (ref 2.5–4.6)

## 2022-12-04 LAB — MAGNESIUM: Magnesium: 1.9 mg/dL (ref 1.7–2.4)

## 2022-12-04 MED ORDER — POLYETHYLENE GLYCOL 3350 17 G PO PACK
17.0000 g | PACK | Freq: Every day | ORAL | Status: DC
  Filled 2022-12-04: qty 1

## 2022-12-04 MED ORDER — BISACODYL 5 MG PO TBEC
10.0000 mg | DELAYED_RELEASE_TABLET | Freq: Once | ORAL | Status: AC
  Administered 2022-12-04: 10 mg via ORAL
  Filled 2022-12-04: qty 2

## 2022-12-04 NOTE — Progress Notes (Signed)
PROGRESS NOTE    Jon Bray  ZOX:[id number] DOB: 07-16-1940 DOA: 11/17/2022 PCP: Danella Penton, MD   Brief Narrative:  Jon Bray is a 82 y.o. male with PMH significant for DM2, HTN, HLD, CAD/NSTEMI/stent 2014, paroxysmal A-fib 5/14, patient was brought to the ED at [facility] with complaint of worsening nausea, vomiting.   In March, patient saw his primary care provider for loss of appetite, weight loss. Workup showed elevated liver enzymes and bilirubin.  3/27 CT abdomen and pelvis showed focal wall thickening in the urinary bladder adjacent to right UVJ suspicious for neoplasm, also extensive sigmoid diverticulosis 3/28, MRI/MRCP showed choledocholithiasis with 2 tiny gallstones near the ampulla measuring 0.2 to 0.3 cm and mild intra and extrahepatic biliary duct dilatation, CBD dilated to 0.9 cm Patient refused to go to ED for urgent evaluation.  PCP sent referral to general surgery and GI 4/9, seen by surgeon Dr. Trisha Mangle.  Cholecystectomy was recommended but patient wanted to wait it out.   5/14, patient was brought to the ED for 3 days of nausea, vomiting. Initial workup showed lipase elevated over 10,000, LFTs elevated as well MRI abdomen showed acute interstitial pancreatitis, choledocholithiasis with 4 mm stone in distal CBD. He was started on conservative management with IV fluid, IV analgesia, IV antiemetics, bowel rest Admitted to [facility] GI and general surgery were consulted. 5/20, ERCP was attempted but had to be aborted due to inability to identify the papula.  Follow-up MRCP showed persistent small stone in the CBD, changes of pancreatitis along with small fluid collection. Overall, liver enzymes continue to improve, Underwent ERCP and Stone Extractionby Dr. Meridee Score (12/02/22).  Tube feedings were initiated on 11/30/2022 but NG has been removed and Cortrak to be replaced.   **Patient now underwent laparoscopic cholecystectomy today given that he felt he was stable for surgery  and because it was clinically felt that his pancreatitis seems to be improving.  Gastroenterology recommends continuing and placing a core track to allow time for his pancreatitis and cholecystectomy that he also this was placed in 2 feedings were resumed.  He remains on IV Zosyn.  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 -GI and general surgery following -Pancreatitis seems to have improved.  Currently was  to tolerate regular consistency diet and LFTs normalized and bilirubin fairly stable but  Underwent MRCP 11/29/2022 which unfortunately shows worsening pancreatitis with small area of pancreatic necrosis at the head with possibly evolving hemorrhage and enlarging pseudocyst.   -It noted Pancreatic inflammation exerts mass effect on distal CBD, there continues to be 3 mm filling defect in distal CBD suspicious for choledocholithiasis.   -WBC Trend: Recent Labs  Lab 11/28/22 0242 11/29/22 0301 11/30/22 0310 12/01/22 0529 12/02/22 0248 12/03/22 0238 12/04/22 0344  WBC 17.4* 14.0* 13.2* 11.9* 11.4* 11.1* 12.6*  -Underwent ERCP by Dr. Meridee Score today and it showed " J-shaped gastric deformity. Gastritis in antrum. No other gross lesions in the entire stomach. Biopsied for H. pylori. No gross lesions in the duodenal bulb, in the first portion of the duodenum and in the second portion of the duodenum. The major papilla appeared normal (though hidden under multiple folds).The fluoroscopic examination was suspicious for sludge. Choledocholithiasis was found. Complete removal was accomplished by biliary sphincterotomy and balloon sweeping. The very distal biliary duct had a slight narrowing compared  to the majority of the bile duct, though this is not clearly a  stricture and the balloon could not pass through this area with ease after stone extraction."   -Patient has been started on NG tube feedings since 11/30/2022 with Osmolite 1.5 Cal Liquid at 55 mL/hr with Prosource TF20 60 mL/Day and Free Water Flushes 150 mL q4h -Remains on antibiotics per GI and general surgery recommendations with IV Zosyn -GI recommends replacing NG with Cortrak today however cortrak team is not available today.  GI recommending continuing to check liver enzymes continue supportive care along with watching for pancreatitis, cholangitis, perforation and or bleeding -GI recommends continue to monitor supportive patient over the course of the coming days and hopefully he is no evidence of post ERCP pancreatitis or other complications and recommends in the coming days and weeks to consider cholecystectomy pending his overall clinical course from his necrotizing/hemorrhagic pancreatitis; it was felt patient was clinically improved to the point where he cannot undergo surgical intervention until general surgery took him for a laparoscopic cholecystectomy 12/03/22 -Diet advancement per GI and is on a full liquid diet and has Cortrak getting Tube Feedings; from a surgical standpoint and they feel that he is okay to advance diet as tolerated but gastroenterology recommends continuing tube feedings and recommending advancing diet from full liquid diet to heart healthy diet and are hoping that we can advance his diet by Sunday to allow to feedings to be titrated down -Further management and care per GI and general surgery and anticipating discharging once he has been deemed stable and able to tolerate a diet -GI is planning on repeating imaging 1 week from the last MRI of the pancreas protocol CT abdomen and hoping to repeat a CT abdomen pancreas protocol on Monday or Tuesday depending on clinical status on Sunday and the GI doctor expects a protracted course due to pancreatic necrosis/hemorrhage  necrosis and need for cholecystectomy -GI also increasing bowel regimen to try and prevent risk of developing ileus   Bibasilar Atelectasis -Persistent bibasilar atelectasis noted in chest x-ray done on 5/18 and repeated 5/24.   -But he is asymptomatic and not hypoxic.   -Continue Incentive spirometry and will order Flutter Valve   CAD/NSTEMI/stent 2014 HLD -No anginal symptoms currently. -PTA on aspirin 81 mg daily.  Currently on hold given NPO and getting tube feedings.  Will resume once okayed by GI and general surgery -Not on statin as an outpatient   Paroxysmal A-fib  -Was on aspirin daily which is currently on hold.  -Not on anticoagulation as an outpatient -Remains on Med-Surge but  if necessary will transfer to Telemetry    Type 2 Diabetes mellitus Diet controlled.  Not on meds, A1c 5.8 on 11/19/2022 -CBG Trend: Recent Labs  Lab 12/03/22 1610 12/03/22 2103 12/03/22 2352 12/04/22 0454 12/04/22 0820 12/04/22 1218 12/04/22 1622  GLUCAP 189* 225* 188* 213* 259* 172* 103*    GERD/GI Prophylaxis  -Continue PPI with pantoprazole 40 mg IV every 24h   Bladder Wall Thickening On 3/27, CT abdomen and pelvis showed focal wall thickening in the bladder adjacent to the right UVJ suspicious for neoplasm.  Radiologist recommended cystoscopy for further evaluation. -Urinalysis 11/16/2022 did not show any evidence of hematuria. -Patient/family have not noticed any hematuria thereafter either. -5/22 Dr. Jacqulyn Bath sent an epic message to on-call urology Dr. McDiarmid, recommended outpatient follow-up for further workup including cystoscopy.    Extensive Sigmoid Diverticulosis  Chronic Constipation -Continue Bowel regimen with MiraLAX 17 g p.o.  daily as needed and senna docusate 1 tab p.o. nightly but GI has increased this and added 10 mg of bisacodyl rectal suppositories for moderate constipation and scheduled MiraLAX 17 g daily   Rash on the back. -Possible associated with newly  linen material. -C/w Hydrocortisone Cream 1% Topically 4 times daily -No complaints at this time   Hyponatremia -Na+ Trend: Recent Labs  Lab 11/30/22 0310 12/01/22 0529 12/02/22 0248 12/02/22 0845 12/03/22 0238 12/03/22 1222 12/04/22 0344  NA 132* 131* 135 134* 135 132* 134*  -Continue to Monitor and Trend -Repeat CMP in the AM   Normocytic Anemia -Hgb/Hct Trend: Recent Labs  Lab 11/28/22 0242 11/29/22 0301 11/30/22 0310 12/01/22 0529 12/02/22 0248 12/03/22 0238 12/04/22 0344  HGB 11.2* 11.6* 11.8* 12.0* 11.9* 12.0* 11.3*  HCT 34.1* 34.9* 36.0* 36.2* 36.7* 37.2* 34.8*  MCV 87.9 85.3 88.5 86.4 88.9 87.3 88.1  -Checked Anemia Panel and showed an iron level of 48, UIBC of 235, TIBC of 283, saturation ratios of 17%, ferritin level of 474, folate of 15.8 and a vitamin B12 of 848 -Continue to Monitor for S/Sx of Bleeding; No overt bleeding noted -Repeat CBC in the AM    Hemolyzed Blood Samples -K+ was elevated this AM and Trend showed: Recent Labs  Lab 11/30/22 0310 12/01/22 0529 12/02/22 0248 12/02/22 0845 12/03/22 0238 12/03/22 1222 12/04/22 0344  K 4.1 3.8 6.0* 4.2 5.3* 3.8 3.8  -Likely Hemolysis so will repeat CMP this AM   Hypoalbuminemia -Patient's Albumin Trend: Recent Labs  Lab 11/29/22 0301 11/30/22 0310 12/01/22 0529 12/02/22 0248 12/02/22 0845 12/03/22 0238 12/04/22 0344  ALBUMIN 2.2* 2.3* 2.2* 2.4* 2.4* 2.6* 2.4*  -Continue to Monitor and Trend and repeat CMP in the AM   DVT prophylaxis: SCDs Start: 11/17/22 1732    Code Status: Full Code Family Communication: Discussed with the wife at bedside  Disposition Plan:  Level of care: Med-Surg Status is: Inpatient Remains inpatient appropriate because: Needs further clinical improvement and clearance by the specialists   Consultants:  Gastroenterology General Surgery  Procedures:  As delineated as above  ERCP Findings:      The scout film was normal.      The upper GI tract was  traversed under direct vision without detailed       examination. 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 Localized       moderate inflammation characterized by adherent blood, congestion       (edema), erythema and friability was found in the gastric antrum. No       other gross lesions were noted in the entire examined stomach (biopsied       for H. pylori). No gross lesions were noted in the duodenal bulb, in the       first portion of the duodenum and in the second portion of the duodenum.       The major papilla was normal in appearance and hidden under multiple       folds.      A short 0.035 inch Soft Jagwire was passed into the biliary tree after       engaging the ampulla with the sphincterotome. The Hydratome       sphincterotome was then passed over the guidewire and the bile duct was       then deeply cannulated. Contrast was injected. I personally interpreted       the bile duct images. Ductal flow of contrast was adequate. Image  quality was adequate. Contrast extended to the entire biliary tree.       Opacification of the entire biliary tree was successful. The maximum       diameter of the ducts was approximately 7 mm. The middle third of the       main bile duct contained filling defect thought to be sludge versus       stone. The very distal bile duct was slightly narrowed compared to the       rest of the biliary tree for approximately 15 mm in length (not clearly       a stenosis). A 7 mm biliary sphincterotomy was made with a monofilament       Hydratome sphincterotome using ERBE electrocautery. There was no       post-sphincterotomy bleeding. To discover objects, the biliary tree was       swept with a retrieval balloon. Sludge was swept from the duct. One       stone was removed. No stones remained. An occlusion cholangiogram was       performed that showed no further significant biliary pathology and the       gallbladder began to fill.      A  pancreatogram was not performed.      The duodenoscope was withdrawn from the patient. Impression:               - J-shaped gastric deformity.                           - Gastritis in antrum. No other gross lesions in                            the entire stomach. Biopsied for H. pylori.                           - No gross lesions in the duodenal bulb, in the                            first portion of the duodenum and in the second                            portion of the duodenum.                           - The major papilla appeared normal (though hidden                            under multiple folds).                           - The fluoroscopic examination was suspicious for                            sludge.                           - Choledocholithiasis was found. Complete removal  was accomplished by biliary sphincterotomy and                            balloon sweeping.                           - The very distal biliary duct had a slight                            narrowing compared to the majority of the bile                            duct, though this is not clearly a stricture and                            the balloon could not pass through this area with                            ease after stone extraction.. Recommendation:           - The patient will be observed post-procedure,                            until all discharge criteria are met.                           - Return patient to hospital ward for ongoing care.                           - Have placed orders for Cortrack placement                            urgently in an effort of continuing tube feeds.                            This is why did not replace the NG tube today.                           - Observe patient's clinical course.                           - Watch for pancreatitis, bleeding, perforation,                            and cholangitis.                            - Check liver enzymes (AST, ALT, alkaline                            phosphatase, bilirubin) in the morning.                           - We will continue to monitor and support the  patient over the course of the coming days.                            Hopefully no evidence of post ERCP pancreatitis or                            other complications will occur. At some point in                            the coming days/weeks we will consider the timing                            of cholecystectomy pending his overall clinical                            course from his necrotizing/hemorrhagic                            pancreatitis.                           - The findings and recommendations were discussed                            with the patient.                           - The findings and recommendations were discussed                            with the patient's family.                           - The findings and recommendations were discussed                            with the referring physician.   Laparoscopic Cholecystectomy by Dr. Phylliss Blakes  Antimicrobials:  Anti-infectives (From admission, onward)    Start     Dose/Rate Route Frequency Ordered Stop   11/25/22 1400  piperacillin-tazobactam (ZOSYN) IVPB 3.375 g        3.375 g 12.5 mL/hr over 240 Minutes Intravenous Every 8 hours 11/25/22 1136     11/18/22 0600  piperacillin-tazobactam (ZOSYN) IVPB 3.375 g  Status:  Discontinued        3.375 g 12.5 mL/hr over 240 Minutes Intravenous Every 8 hours 11/17/22 1753 11/24/22 0949   11/17/22 1845  piperacillin-tazobactam (ZOSYN) IVPB 3.375 g        3.375 g 100 mL/hr over 30 Minutes Intravenous  Once 11/17/22 1753 11/17/22 2012       Subjective: Seen and examined at bedside and was a little sore.  No nausea or vomiting.  Hopeful for advancing diet given that he is feeling little bit hungry.  No nausea or vomiting.  Continues to get tube feedings  via small bore tube.  States he slept okay.  No other concerns or complaints this time.  Objective: Vitals:   12/03/22 1115 12/03/22 1130 12/04/22 0820 12/04/22 1620  BP: 131/75 131/78 134/79 (!) 144/76  Pulse: 85 85 75 75  Resp: 18 19 16 16   Temp:  (!) 97.5 F (36.4 C) 98.6 F (37 C) 97.8 F (36.6 C)  TempSrc:   Oral Oral  SpO2: 95% 95% 97% 98%  Weight:      Height:        Intake/Output Summary (Last 24 hours) at 12/04/2022 1645 Last data filed at 12/04/2022 0920 Gross per 24 hour  Intake 335 ml  Output --  Net 335 ml   Filed Weights   12/02/22 0500 12/03/22 0429 12/03/22 0807  Weight: 74.9 kg 73.6 kg 73.6 kg   Examination: Physical Exam:  Constitutional: WN/WD elderly Caucasian male in no acute distress and has a cortrak in place Respiratory: Diminished to auscultation bilaterally, no wheezing, rales, rhonchi or crackles. Normal respiratory effort and patient is not tachypenic. No accessory muscle use.  Unlabored breathing Cardiovascular: RRR, no murmurs / rubs / gallops. S1 and S2 auscultated. No extremity edema.  Abdomen: Soft, mildly tender.  Has abdominal incisions noted.  Bowel sounds positive.  GU: Deferred. Musculoskeletal: No clubbing / cyanosis of digits/nails. No joint deformity upper and lower extremities.  Skin: No rashes, lesions, ulcers on limited skin evaluation. No induration; Warm and dry.  Neurologic: CN 2-12 grossly intact with no focal deficits. Romberg sign and cerebellar reflexes not assessed.  Psychiatric: Normal judgment and insight. Alert and oriented x 3. Normal mood and appropriate affect.   Data Reviewed: I have personally reviewed following labs and imaging studies  CBC: Recent Labs  Lab 11/29/22 0301 11/30/22 0310 12/01/22 0529 12/02/22 0248 12/03/22 0238 12/04/22 0344  WBC 14.0* 13.2* 11.9* 11.4* 11.1* 12.6*  NEUTROABS 9.6* 8.0* 7.1  --  6.9 7.7  HGB 11.6* 11.8* 12.0* 11.9* 12.0* 11.3*  HCT 34.9* 36.0* 36.2* 36.7* 37.2* 34.8*  MCV  85.3 88.5 86.4 88.9 87.3 88.1  PLT 319 355 358 385 422* 373   Basic Metabolic Panel: Recent Labs  Lab 11/30/22 1639 12/01/22 0529 12/01/22 1710 12/02/22 0248 12/02/22 0845 12/03/22 0238 12/03/22 1222 12/04/22 0344  NA  --  131*  --  135 134* 135 132* 134*  K  --  3.8  --  6.0* 4.2 5.3* 3.8 3.8  CL  --  96*  --  98 100 100 99 100  CO2  --  24  --  28 26 27 25 25   GLUCOSE  --  253*  --  153* 161* 157* 196* 230*  BUN  --  15  --  13 13 15 14 15   CREATININE  --  0.91  --  1.02 0.96 1.07 1.08 0.94  CALCIUM  --  8.0*  --  8.5* 8.2* 8.9 8.1* 8.1*  MG 2.1 2.0 2.0  --   --  2.2  --  1.9  PHOS 3.1 3.2 2.6  --   --  2.7  --  3.0   GFR: Estimated Creatinine Clearance: 57.6 mL/min (by C-G formula based on SCr of 0.94 mg/dL). Liver Function Tests: Recent Labs  Lab 12/01/22 0529 12/02/22 0248 12/02/22 0845 12/03/22 0238 12/04/22 0344  AST 23 28 31 31  46*  ALT 21 23 25 30  41  ALKPHOS 58 62 59 64 54  BILITOT 0.8 0.7 0.8 0.7 0.6  PROT 6.4* 6.8 6.6 7.2 6.3*  ALBUMIN 2.2* 2.4* 2.4* 2.6* 2.4*   No results for input(s): "LIPASE", "AMYLASE" in the last 168 hours. No results for input(s): "AMMONIA" in the last 168  hours. Coagulation Profile: Recent Labs  Lab 12/02/22 0248  INR 1.1   Cardiac Enzymes: No results for input(s): "CKTOTAL", "CKMB", "CKMBINDEX", "TROPONINI" in the last 168 hours. BNP (last 3 results) No results for input(s): "PROBNP" in the last 8760 hours. HbA1C: No results for input(s): "HGBA1C" in the last 72 hours. CBG: Recent Labs  Lab 12/03/22 2352 12/04/22 0454 12/04/22 0820 12/04/22 1218 12/04/22 1622  GLUCAP 188* 213* 259* 172* 103*   Lipid Profile: No results for input(s): "CHOL", "HDL", "LDLCALC", "TRIG", "CHOLHDL", "LDLDIRECT" in the last 72 hours. Thyroid Function Tests: No results for input(s): "TSH", "T4TOTAL", "FREET4", "T3FREE", "THYROIDAB" in the last 72 hours. Anemia Panel: Recent Labs    12/03/22 0238  VITAMINB12 848  FOLATE 15.8   FERRITIN 474*  TIBC 283  IRON 48  RETICCTPCT 1.2   Sepsis Labs: No results for input(s): "PROCALCITON", "LATICACIDVEN" in the last 168 hours.  No results found for this or any previous visit (from the past 240 hour(s)).   Radiology Studies: DG Abd Portable 1V  Result Date: 12/03/2022 CLINICAL DATA:  Feeding tube placement EXAM: PORTABLE ABDOMEN - 1 VIEW COMPARISON:  11/29/2022 FINDINGS: Weighted tip feeding tube placement, tip near the pylorus. Stomach decompressed. Cholecystectomy clips. Normal bowel gas pattern. Aortic Atherosclerosis (ICD10-170.0). IMPRESSION: Feeding tube tip near the pylorus. Electronically Signed   By: Corlis Leak M.D.   On: 12/03/2022 15:17    Scheduled Meds:  feeding supplement  1 Container Oral TID BM   feeding supplement (PROSource TF20)  60 mL Per Tube Daily   free water  150 mL Per Tube Q4H   guaiFENesin  600 mg Oral BID   hydrocortisone cream   Topical QID   insulin aspart  0-9 Units Subcutaneous Q4H   pantoprazole (PROTONIX) IV  40 mg Intravenous Q24H   polyethylene glycol  17 g Oral Daily   senna-docusate  1 tablet Oral QHS   sodium chloride  1 drop Both Eyes QHS   Continuous Infusions:  feeding supplement (OSMOLITE 1.5 CAL) 55 mL/hr at 12/04/22 0725   methocarbamol (ROBAXIN) IV     piperacillin-tazobactam (ZOSYN)  IV 3.375 g (12/04/22 0927)    LOS: 17 days   Marguerita Merles, DO [facility] Available via Epic secure chat 7am-7pm After these hours, please refer to coverage provider listed on [email or web address] 12/04/2022, 4:45 PM

## 2022-12-04 NOTE — Plan of Care (Signed)
A/ox4 and on room air. Prn oxy given x1 for post op abdominal pain, with good effect. Tube feeds increased 45 during the night. Remains on IV antibiotics. Up with 1 assist. No overnight issues.    Problem: Education: Goal: Knowledge of General Education information will improve Description: Including pain rating scale, medication(s)/side effects and non-pharmacologic comfort measures Outcome: Progressing   Problem: Health Behavior/Discharge Planning: Goal: Ability to manage health-related needs will improve Outcome: Progressing   Problem: Clinical Measurements: Goal: Ability to maintain clinical measurements within normal limits will improve Outcome: Progressing Goal: Will remain free from infection Outcome: Progressing Goal: Diagnostic test results will improve Outcome: Progressing Goal: Respiratory complications will improve Outcome: Progressing Goal: Cardiovascular complication will be avoided Outcome: Progressing   Problem: Activity: Goal: Risk for activity intolerance will decrease Outcome: Progressing   Problem: Nutrition: Goal: Adequate nutrition will be maintained Outcome: Progressing   Problem: Coping: Goal: Level of anxiety will decrease Outcome: Progressing   Problem: Elimination: Goal: Will not experience complications related to bowel motility Outcome: Progressing Goal: Will not experience complications related to urinary retention Outcome: Progressing   Problem: Pain Managment: Goal: General experience of comfort will improve Outcome: Progressing   Problem: Safety: Goal: Ability to remain free from injury will improve Outcome: Progressing   Problem: Skin Integrity: Goal: Risk for impaired skin integrity will decrease Outcome: Progressing   Problem: Education: Goal: Knowledge of Pancreatitis treatment and prevention will improve Outcome: Progressing   Problem: Health Behavior/Discharge Planning: Goal: Ability to formulate a plan to maintain an  alcohol-free life will improve Outcome: Progressing   Problem: Nutritional: Goal: Ability to achieve adequate nutritional intake will improve Outcome: Progressing   Problem: Clinical Measurements: Goal: Complications related to the disease process, condition or treatment will be avoided or minimized Outcome: Progressing   Problem: Education: Goal: Ability to describe self-care measures that may prevent or decrease complications (Diabetes Survival Skills Education) will improve Outcome: Progressing Goal: Individualized Educational Video(s) Outcome: Progressing   Problem: Coping: Goal: Ability to adjust to condition or change in health will improve Outcome: Progressing   Problem: Fluid Volume: Goal: Ability to maintain a balanced intake and output will improve Outcome: Progressing   Problem: Health Behavior/Discharge Planning: Goal: Ability to identify and utilize available resources and services will improve Outcome: Progressing Goal: Ability to manage health-related needs will improve Outcome: Progressing   Problem: Metabolic: Goal: Ability to maintain appropriate glucose levels will improve Outcome: Progressing   Problem: Nutritional: Goal: Maintenance of adequate nutrition will improve Outcome: Progressing Goal: Progress toward achieving an optimal weight will improve Outcome: Progressing   Problem: Skin Integrity: Goal: Risk for impaired skin integrity will decrease Outcome: Progressing   Problem: Tissue Perfusion: Goal: Adequacy of tissue perfusion will improve Outcome: Progressing

## 2022-12-04 NOTE — Progress Notes (Signed)
Gastroenterology Inpatient Follow-up Note   PATIENT IDENTIFICATION  Jon Bray is a 82 y.o. male with a pmh significant for CAD, hypertension, atrial fibrillation, hyperlipidemia, chronic renal insufficiency, nephrolithiasis, cholelithiasis, gallstone pancreatitis with retained biliary stone.  Still hospitalized with necrotizing/hemorrhagic pancreatitis and previously failed ERCP and now status post biliary sphincterotomy and stone extraction and now status post cholecystectomy. Hospital Day: 62  SUBJECTIVE  The patient's chart has been reviewed. The patient's labs have been reviewed. I actually saw the patient and his wife walking down the corridors. Patient has not had a bowel movement or passed gas as of yet but is hopeful. He is having pain around his incisions. He is not having any fevers or chills. He has minimal appetite but has his core track going at appropriate feeding rate. He and his wife are hopeful that this is a means to the end of his gallstone and pancreas issues.   OBJECTIVE  Scheduled Inpatient Medications:   feeding supplement  1 Container Oral TID BM   feeding supplement (PROSource TF20)  60 mL Per Tube Daily   free water  150 mL Per Tube Q4H   guaiFENesin  600 mg Oral BID   hydrocortisone cream   Topical QID   insulin aspart  0-9 Units Subcutaneous Q4H   pantoprazole (PROTONIX) IV  40 mg Intravenous Q24H   senna-docusate  1 tablet Oral QHS   sodium chloride  1 drop Both Eyes QHS   Continuous Inpatient Infusions:   feeding supplement (OSMOLITE 1.5 CAL) 55 mL/hr at 12/04/22 0725   methocarbamol (ROBAXIN) IV     piperacillin-tazobactam (ZOSYN)  IV 3.375 g (12/04/22 0927)   PRN Inpatient Medications: acetaminophen **OR** acetaminophen, bisacodyl, HYDROmorphone (DILAUDID) injection, methocarbamol (ROBAXIN) IV, ondansetron **OR** ondansetron (ZOFRAN) IV, oxyCODONE, phenol, polyethylene glycol, polyvinyl alcohol, simethicone   Physical Examination  Temp:   [98.6 F (37 C)] 98.6 F (37 C) (06/01 0820) Pulse Rate:  [75] 75 (06/01 0820) Resp:  [16] 16 (06/01 0820) BP: (134)/(79) 134/79 (06/01 0820) SpO2:  [97 %] 97 % (06/01 0820) Temp (24hrs), Avg:98.6 F (37 C), Min:98.6 F (37 C), Max:98.6 F (37 C)  Weight: 73.6 kg GEN: No acute distress, walking the hallway with wife PSYCH: Cooperative, without pressured speech EYE: Conjunctivae pink, sclerae anicteric ENT: MMM, core track in place CV: Nontachycardic RESP: No audible wheezing GI: TTP in MEG and at incisions (did not fully evaluate as he was walking in the corridor doors) MSK/EXT: No significant lower extremity edema SKIN: No jaundice NEURO:  Alert & Oriented x 3, no focal deficits   Review of Data   Laboratory Studies   Recent Labs  Lab 12/01/22 1710 12/02/22 0248 12/03/22 0238 12/03/22 1222 12/04/22 0344  NA  --    < > 135   < > 134*  K  --    < > 5.3*   < > 3.8  CL  --    < > 100   < > 100  CO2  --    < > 27   < > 25  BUN  --    < > 15   < > 15  CREATININE  --    < > 1.07   < > 0.94  GLUCOSE  --    < > 157*   < > 230*  CALCIUM  --    < > 8.9   < > 8.1*  MG 2.0  --  2.2  --  1.9  PHOS  2.6  --  2.7  --  3.0   < > = values in this interval not displayed.   Recent Labs  Lab 12/04/22 0344  AST 46*  ALT 41  ALKPHOS 54    Recent Labs  Lab 12/02/22 0248 12/03/22 0238 12/04/22 0344  WBC 11.4* 11.1* 12.6*  HGB 11.9* 12.0* 11.3*  HCT 36.7* 37.2* 34.8*  PLT 385 422* 373   Recent Labs  Lab 12/02/22 0248  INR 1.1   Imaging Studies  DG Abd Portable 1V  Result Date: 12/03/2022 CLINICAL DATA:  Feeding tube placement EXAM: PORTABLE ABDOMEN - 1 VIEW COMPARISON:  11/29/2022 FINDINGS: Weighted tip feeding tube placement, tip near the pylorus. Stomach decompressed. Cholecystectomy clips. Normal bowel gas pattern. Aortic Atherosclerosis (ICD10-170.0). IMPRESSION: Feeding tube tip near the pylorus. Electronically Signed   By: Corlis Leak M.D.   On: 12/03/2022 15:17     GI Procedures and Studies  No new procedures to review   ASSESSMENT  [name] is a 82 y.o. male with a pmh significant for CAD, hypertension, atrial fibrillation, hyperlipidemia, chronic renal insufficiency, nephrolithiasis, cholelithiasis, gallstone pancreatitis with retained biliary stone.  Still hospitalized with necrotizing/hemorrhagic pancreatitis and previously failed ERCP and now status post biliary sphincterotomy and stone extraction and now status post cholecystectomy.  The patient is clinically and hemodynamically stable today.  I am hopeful that today and tomorrow the patient will have time to heal from his cholecystectomy and also allow his pancreas to continue to evolve.  I think on Monday or Tuesday we will probably plan a CT abdomen pancreas protocol to see what things look like over the course of a week. Hopefully by Sunday he will be able to advance his diet and that will allow [place] to try to down titrate his core track but if he is not able to down titrate his core track needs, that is okay we can see what his imaging shows and then slowly progress him.  He can certainly go to rehab if necessary with his core track in place but hopefully things are able to improve with oral intake in the coming days.  Have given the patient an increased bowel regimen in an effort of trying to maximize decreasing his risk of ileus developing.  We will see where things are tomorrow.   PLAN/RECOMMENDATIONS  Continue core track feedings Full liquid diet to heart healthy diet as patient sees he can advance over the course the coming days Depending on clinical status on Sunday we will consider imaging on Monday or Tuesday with CT abdomen pancreas protocol Expect a protracted course due to the pancreatic necrosis/hemorrhagic necrosis and need for cholecystectomy healing Bowel regimen for optimization -MiraLAX daily -Senokot nightly -Dulcolax PR 10 mg as needed   Please page/call with questions or  concerns.   Corliss Parish, MD [facility] Office # [phone]    LOS: 17 days  [name]  12/04/2022, 1:08 PM

## 2022-12-04 NOTE — Progress Notes (Signed)
1 Day Post-Op  Subjective: Sore, as expected, but otherwise no complaints  Objective: Vital signs in last 24 hours: Temp:  [97.5 F (36.4 C)-98.3 F (36.8 C)] 98 F (36.7 C) (05/31 2107) Pulse Rate:  [71-91] 72 (06/01 0455) Resp:  [16-20] 16 (06/01 0455) BP: (106-149)/(67-83) 131/72 (06/01 0455) SpO2:  [94 %-99 %] 97 % (06/01 0455) Weight:  [73.6 kg] 73.6 kg (05/31 0807) Last BM Date : 12/02/22  Intake/Output from previous day: 05/31 0701 - 06/01 0700 In: 785 [I.V.:750; NG/GT:35] Out: -  Intake/Output this shift: No intake/output data recorded.  PE: Gen:  Alert, NAD, pleasant Abd: Soft, appropriately tender, nondistended.  Incisions c/d/I no cellulitis or hematoma  Lab Results:  Recent Labs    12/03/22 0238 12/04/22 0344  WBC 11.1* 12.6*  HGB 12.0* 11.3*  HCT 37.2* 34.8*  PLT 422* 373    BMET Recent Labs    12/03/22 1222 12/04/22 0344  NA 132* 134*  K 3.8 3.8  CL 99 100  CO2 25 25  GLUCOSE 196* 230*  BUN 14 15  CREATININE 1.08 0.94  CALCIUM 8.1* 8.1*    PT/INR Recent Labs    12/02/22 0248  LABPROT 14.3  INR 1.1     CMP     Component Value Date/Time   NA 134 (L) 12/04/2022 0344   NA 138 05/12/2013 0759   K 3.8 12/04/2022 0344   K 3.7 05/12/2013 0759   CL 100 12/04/2022 0344   CL 105 05/12/2013 0759   CO2 25 12/04/2022 0344   CO2 25 05/12/2013 0759   GLUCOSE 230 (H) 12/04/2022 0344   GLUCOSE 199 (H) 05/12/2013 0759   BUN 15 12/04/2022 0344   BUN 18 05/12/2013 0759   CREATININE 0.94 12/04/2022 0344   CREATININE 1.08 05/12/2013 0759   CALCIUM 8.1 (L) 12/04/2022 0344   CALCIUM 8.8 05/12/2013 0759   PROT 6.3 (L) 12/04/2022 0344   PROT 8.0 05/12/2013 0759   ALBUMIN 2.4 (L) 12/04/2022 0344   ALBUMIN 4.0 05/12/2013 0759   AST 46 (H) 12/04/2022 0344   AST 22 05/12/2013 0759   ALT 41 12/04/2022 0344   ALT 32 05/12/2013 0759   ALKPHOS 54 12/04/2022 0344   ALKPHOS 79 05/12/2013 0759   BILITOT 0.6 12/04/2022 0344   BILITOT 1.0  05/12/2013 0759   GFRNONAA >60 12/04/2022 0344   GFRNONAA >60 05/12/2013 0759   GFRAA >60 12/07/2018 2227   GFRAA >60 05/12/2013 0759   Lipase     Component Value Date/Time   LIPASE 32 11/21/2022 0237    Studies/Results: DG Abd Portable 1V  Result Date: 12/03/2022 CLINICAL DATA:  Feeding tube placement EXAM: PORTABLE ABDOMEN - 1 VIEW COMPARISON:  11/29/2022 FINDINGS: Weighted tip feeding tube placement, tip near the pylorus. Stomach decompressed. Cholecystectomy clips. Normal bowel gas pattern. Aortic Atherosclerosis (ICD10-170.0). IMPRESSION: Feeding tube tip near the pylorus. Electronically Signed   By: Corlis Leak M.D.   On: 12/03/2022 15:17   DG ERCP  Result Date: 12/02/2022 CLINICAL DATA:  Gallstone pancreatitis. ERCP with sphincterotomy and balloon sweeps. EXAM: ERCP TECHNIQUE: Multiple spot images obtained with the fluoroscopic device and submitted for interpretation post-procedure. FLUOROSCOPY TIME: FLUOROSCOPY TIME 1 minute, 13 seconds (17.4 mGy) COMPARISON:  Abdominal MRI- 11/29/2022 FINDINGS: Three spot intraoperative fluoroscopic images of the right upper abdominal quadrant during ERCP are provided for review as well as an additional balloon cholangiogram. Initial image demonstrates an ERCP probe overlying the right upper abdominal quadrant. There is selective cannulation and  opacification of the common bile duct with tapered narrowing at its distal aspect. Subsequent images demonstrate insufflation of a balloon within the central aspect of the CBD with subsequent biliary sweeping and balloon cholangiogram. There is opacification of the cystic duct with passage of contrast to the level of the gallbladder. Several filling defects are seen within the gallbladder lumen compatible with known cholelithiasis. There is minimal opacification of the intrahepatic biliary tree which appears nondilated. There are no discrete filling defects within opacified portion of the biliary tree to suggest  choledocholithiasis. There is no definitive opacification of the pancreatic duct. IMPRESSION: ERCP with biliary sweeping as detailed above. These images were submitted for radiologic interpretation only. Please see the procedural report for the amount of contrast and the fluoroscopy time utilized. Electronically Signed   By: Simonne Come M.D.   On: 12/02/2022 12:35    Anti-infectives: Anti-infectives (From admission, onward)    Start     Dose/Rate Route Frequency Ordered Stop   11/25/22 1400  piperacillin-tazobactam (ZOSYN) IVPB 3.375 g        3.375 g 12.5 mL/hr over 240 Minutes Intravenous Every 8 hours 11/25/22 1136     11/18/22 0600  piperacillin-tazobactam (ZOSYN) IVPB 3.375 g  Status:  Discontinued        3.375 g 12.5 mL/hr over 240 Minutes Intravenous Every 8 hours 11/17/22 1753 11/24/22 0949   11/17/22 1845  piperacillin-tazobactam (ZOSYN) IVPB 3.375 g        3.375 g 100 mL/hr over 30 Minutes Intravenous  Once 11/17/22 1753 11/17/22 2012        Assessment/Plan Gallstone pancreatitis  Choledocholithiasis with elevated LFT's -Status post successful ERCP with sphincterotomy 5/30, Dr. Meridee Score - MRCP 5/21 with choledocholithiasis and concern for possible developing pseudocyst  -Lap chole 5/31  Ok to advance as tolerated from surgery perspective. Defer tube feed management to primary and GI teams.     FEN - Low fat diet , IVF per [facility] VTE - SCDs, LMWH  ID - Zosyn per [facility] (see above)  I reviewed Consultant (GI) notes, hospitalist notes, last 24 h vitals and pain scores, last 48 h intake and output, and last 24 h labs and trends.   LOS: 17 days    Jon Bray ,MD [facility] 12/04/2022, 7:35 AM Please see Amion for pager number during day hours 7:00am-4:30pm

## 2022-12-05 ENCOUNTER — Encounter: Payer: Self-pay | Admitting: Gastroenterology

## 2022-12-05 DIAGNOSIS — K8050 Calculus of bile duct without cholangitis or cholecystitis without obstruction: Secondary | ICD-10-CM | POA: Diagnosis not present

## 2022-12-05 DIAGNOSIS — K3189 Other diseases of stomach and duodenum: Secondary | ICD-10-CM | POA: Diagnosis not present

## 2022-12-05 DIAGNOSIS — Z9889 Other specified postprocedural states: Secondary | ICD-10-CM | POA: Diagnosis not present

## 2022-12-05 DIAGNOSIS — K529 Noninfective gastroenteritis and colitis, unspecified: Secondary | ICD-10-CM | POA: Insufficient documentation

## 2022-12-05 DIAGNOSIS — R197 Diarrhea, unspecified: Secondary | ICD-10-CM

## 2022-12-05 DIAGNOSIS — K5900 Constipation, unspecified: Secondary | ICD-10-CM | POA: Diagnosis not present

## 2022-12-05 DIAGNOSIS — K851 Biliary acute pancreatitis without necrosis or infection: Secondary | ICD-10-CM | POA: Diagnosis not present

## 2022-12-05 DIAGNOSIS — K859 Acute pancreatitis without necrosis or infection, unspecified: Secondary | ICD-10-CM | POA: Diagnosis not present

## 2022-12-05 LAB — COMPREHENSIVE METABOLIC PANEL
ALT: 42 U/L (ref 0–44)
AST: 42 U/L — ABNORMAL HIGH (ref 15–41)
Albumin: 2.6 g/dL — ABNORMAL LOW (ref 3.5–5.0)
Alkaline Phosphatase: 61 U/L (ref 38–126)
Anion gap: 10 (ref 5–15)
BUN: 14 mg/dL (ref 8–23)
CO2: 24 mmol/L (ref 22–32)
Calcium: 8.4 mg/dL — ABNORMAL LOW (ref 8.9–10.3)
Chloride: 99 mmol/L (ref 98–111)
Creatinine, Ser: 0.92 mg/dL (ref 0.61–1.24)
GFR, Estimated: >60 mL/min (ref >60)
Glucose, Bld: 226 mg/dL — ABNORMAL HIGH (ref 70–99)
Potassium: 3.6 mmol/L (ref 3.5–5.1)
Sodium: 133 mmol/L — ABNORMAL LOW (ref 135–145)
Total Bilirubin: 0.7 mg/dL (ref 0.3–1.2)
Total Protein: 7.1 g/dL (ref 6.5–8.1)

## 2022-12-05 LAB — CBC WITH DIFFERENTIAL/PLATELET
Abs Immature Granulocytes: 0.04 10*3/uL (ref 0.00–0.07)
Basophils Absolute: 0.1 10*3/uL (ref 0.0–0.1)
Basophils Relative: 1 %
Eosinophils Absolute: 0.1 10*3/uL (ref 0.0–0.5)
Eosinophils Relative: 1 %
HCT: 37.3 % — ABNORMAL LOW (ref 39.0–52.0)
Hemoglobin: 12.2 g/dL — ABNORMAL LOW (ref 13.0–17.0)
Immature Granulocytes: 0 %
Lymphocytes Relative: 35 %
Lymphs Abs: 3.8 10*3/uL (ref 0.7–4.0)
MCH: 28.6 pg (ref 26.0–34.0)
MCHC: 32.7 g/dL (ref 30.0–36.0)
MCV: 87.4 fL (ref 80.0–100.0)
Monocytes Absolute: 0.9 10*3/uL (ref 0.1–1.0)
Monocytes Relative: 8 %
Neutro Abs: 6.2 10*3/uL (ref 1.7–7.7)
Neutrophils Relative %: 55 %
Platelets: 410 10*3/uL — ABNORMAL HIGH (ref 150–400)
RBC: 4.27 MIL/uL (ref 4.22–5.81)
RDW: 14.4 % (ref 11.5–15.5)
WBC: 11.1 10*3/uL — ABNORMAL HIGH (ref 4.0–10.5)
nRBC: 0 % (ref 0.0–0.2)

## 2022-12-05 LAB — GLUCOSE, CAPILLARY
Glucose-Capillary: 184 mg/dL — ABNORMAL HIGH (ref 70–99)
Glucose-Capillary: 227 mg/dL — ABNORMAL HIGH (ref 70–99)
Glucose-Capillary: 170 mg/dL — ABNORMAL HIGH (ref 70–99)
Glucose-Capillary: 213 mg/dL — ABNORMAL HIGH (ref 70–99)
Glucose-Capillary: 226 mg/dL — ABNORMAL HIGH (ref 70–99)

## 2022-12-05 LAB — C DIFFICILE (CDIFF) QUICK SCRN (NO PCR REFLEX)
C Diff antigen: NEGATIVE
C Diff interpretation: NOT DETECTED
C Diff toxin: NEGATIVE

## 2022-12-05 LAB — MAGNESIUM: Magnesium: 1.9 mg/dL (ref 1.7–2.4)

## 2022-12-05 LAB — PHOSPHORUS: Phosphorus: 2.7 mg/dL (ref 2.5–4.6)

## 2022-12-05 MED ORDER — SENNOSIDES-DOCUSATE SODIUM 8.6-50 MG PO TABS: 1.0000 | ORAL_TABLET | Freq: Every evening | ORAL | Status: DC | PRN

## 2022-12-05 NOTE — Progress Notes (Signed)
Assessment & Plan: Gallstone pancreatitis  Choledocholithiasis with elevated LFT's - Status post successful ERCP with sphincterotomy 5/30, Dr. Meridee Score - MRCP 5/21 with choledocholithiasis and concern for possible developing pseudocyst  - Lap chole 5/31 - Dr. Fredricka Bonine   Patient up in room, had oatmeal for breakfast.  Family at bedside.  Overall, doing better.  Will follow.         Jon Level, MD [facility] A [facility] practice Office: [phone]        Chief Complaint: Acute hemorrhagic pancreatitis  Subjective: Patient up in room, family at bedside.  Advancing diet.  Feeling better.  Objective: Vital signs in last 24 hours: Temp:  [97.6 F (36.4 C)-97.9 F (36.6 C)] 97.6 F (36.4 C) (06/02 0458) Pulse Rate:  [68-75] 68 (06/02 0458) Resp:  [16-18] 18 (06/02 0458) BP: (130-144)/(75-76) 134/76 (06/02 0458) SpO2:  [96 %-98 %] 97 % (06/02 0458) Last BM Date : 12/05/22  Intake/Output from previous day: 06/01 0701 - 06/02 0700 In: 380 [P.O.:80; NG/GT:300] Out: -  Intake/Output this shift: No intake/output data recorded.  Physical Exam: HEENT - sclerae clear, mucous membranes moist Abdomen - soft without distension; wounds dry and intact with Dermabond in place Ext - no edema, non-tender Neuro - alert & oriented, no focal deficits  Lab Results:  Recent Labs    12/04/22 0344 12/05/22 0307  WBC 12.6* 11.1*  HGB 11.3* 12.2*  HCT 34.8* 37.3*  PLT 373 410*   BMET Recent Labs    12/04/22 0344 12/05/22 0307  NA 134* 133*  K 3.8 3.6  CL 100 99  CO2 25 24  GLUCOSE 230* 226*  BUN 15 14  CREATININE 0.94 0.92  CALCIUM 8.1* 8.4*   PT/INR No results for input(s): "LABPROT", "INR" in the last 72 hours. Comprehensive Metabolic Panel:    Component Value Date/Time   NA 133 (L) 12/05/2022 0307   NA 134 (L) 12/04/2022 0344   NA 138 05/12/2013 0759   K 3.6 12/05/2022 0307   K 3.8 12/04/2022 0344   K 3.7 05/12/2013 0759   CL 99 12/05/2022  0307   CL 100 12/04/2022 0344   CL 105 05/12/2013 0759   CO2 24 12/05/2022 0307   CO2 25 12/04/2022 0344   CO2 25 05/12/2013 0759   BUN 14 12/05/2022 0307   BUN 15 12/04/2022 0344   BUN 18 05/12/2013 0759   CREATININE 0.92 12/05/2022 0307   CREATININE 0.94 12/04/2022 0344   CREATININE 1.08 05/12/2013 0759   GLUCOSE 226 (H) 12/05/2022 0307   GLUCOSE 230 (H) 12/04/2022 0344   GLUCOSE 199 (H) 05/12/2013 0759   CALCIUM 8.4 (L) 12/05/2022 0307   CALCIUM 8.1 (L) 12/04/2022 0344   CALCIUM 8.8 05/12/2013 0759   AST 42 (H) 12/05/2022 0307   AST 46 (H) 12/04/2022 0344   AST 22 05/12/2013 0759   ALT 42 12/05/2022 0307   ALT 41 12/04/2022 0344   ALT 32 05/12/2013 0759   ALKPHOS 61 12/05/2022 0307   ALKPHOS 54 12/04/2022 0344   ALKPHOS 79 05/12/2013 0759   BILITOT 0.7 12/05/2022 0307   BILITOT 0.6 12/04/2022 0344   BILITOT 1.0 05/12/2013 0759   PROT 7.1 12/05/2022 0307   PROT 6.3 (L) 12/04/2022 0344   PROT 8.0 05/12/2013 0759   ALBUMIN 2.6 (L) 12/05/2022 0307   ALBUMIN 2.4 (L) 12/04/2022 0344   ALBUMIN 4.0 05/12/2013 0759    Studies/Results: DG Abd Portable 1V  Result Date: 12/03/2022 CLINICAL DATA:  Feeding  tube placement EXAM: PORTABLE ABDOMEN - 1 VIEW COMPARISON:  11/29/2022 FINDINGS: Weighted tip feeding tube placement, tip near the pylorus. Stomach decompressed. Cholecystectomy clips. Normal bowel gas pattern. Aortic Atherosclerosis (ICD10-170.0). IMPRESSION: Feeding tube tip near the pylorus. Electronically Signed   By: Corlis Leak M.D.   On: 12/03/2022 15:17      Jon Bray 12/05/2022  Patient ID: Jon Bray, male   DOB: [birth year], 82 y.o.   MRN: [id number]

## 2022-12-05 NOTE — Progress Notes (Signed)
PROGRESS NOTE    Jon Bray  WGN:[id number] DOB: 1941-05-15 DOA: 11/17/2022 PCP: Danella Penton, MD   Brief Narrative:  Jon Bray is a 82 y.o. male with PMH significant for DM2, HTN, HLD, CAD/NSTEMI/stent 2014, paroxysmal A-fib 5/14, patient was brought to the ED at [facility] with complaint of worsening nausea, vomiting.   In March, patient saw his primary care provider for loss of appetite, weight loss. Workup showed elevated liver enzymes and bilirubin.  3/27 CT abdomen and pelvis showed focal wall thickening in the urinary bladder adjacent to right UVJ suspicious for neoplasm, also extensive sigmoid diverticulosis 3/28, MRI/MRCP showed choledocholithiasis with 2 tiny gallstones near the ampulla measuring 0.2 to 0.3 cm and mild intra and extrahepatic biliary duct dilatation, CBD dilated to 0.9 cm Patient refused to go to ED for urgent evaluation.  PCP sent referral to general surgery and GI 4/9, seen by surgeon Dr. Trisha Mangle.  Cholecystectomy was recommended but patient wanted to wait it out.   5/14, patient was brought to the ED for 3 days of nausea, vomiting. Initial workup showed lipase elevated over 10,000, LFTs elevated as well MRI abdomen showed acute interstitial pancreatitis, choledocholithiasis with 4 mm stone in distal CBD. He was started on conservative management with IV fluid, IV analgesia, IV antiemetics, bowel rest Admitted to [facility] GI and general surgery were consulted. 5/20, ERCP was attempted but had to be aborted due to inability to identify the papula.  Follow-up MRCP showed persistent small stone in the CBD, changes of pancreatitis along with small fluid collection. Overall, liver enzymes continue to improve, Underwent ERCP and Stone Extractionby Dr. Meridee Score (12/02/22).  Tube feedings were initiated on 11/30/2022 but NG has been removed and Cortrak to be replaced.   **Patient now underwent laparoscopic cholecystectomy today given that he felt he was stable for surgery  and because it was clinically felt that his pancreatitis seems to be improving.  Gastroenterology recommends continuing and placing a core track to allow time for his pancreatitis and cholecystectomy that he also this was placed in 2 feedings were resumed.  He remains on IV Zosyn.  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 -GI and general surgery following -Pancreatitis seems to have improved.  Currently was  to tolerate regular consistency diet and LFTs normalized and bilirubin fairly stable but  Underwent MRCP 11/29/2022 which unfortunately shows worsening pancreatitis with small area of pancreatic necrosis at the head with possibly evolving hemorrhage and enlarging pseudocyst.   -It noted Pancreatic inflammation exerts mass effect on distal CBD, there continues to be 3 mm filling defect in distal CBD suspicious for choledocholithiasis.   -WBC Trend: Recent Labs  Lab 11/29/22 0301 11/30/22 0310 12/01/22 0529 12/02/22 0248 12/03/22 0238 12/04/22 0344 12/05/22 0307  WBC 14.0* 13.2* 11.9* 11.4* 11.1* 12.6* 11.1*  -Underwent ERCP by Dr. Meridee Score today and it showed " J-shaped gastric deformity. Gastritis in antrum. No other gross lesions in the entire stomach. Biopsied for H. pylori. No gross lesions in the duodenal bulb, in the first portion of the duodenum and in the second portion of the duodenum. The major papilla appeared normal (though hidden under multiple folds).The fluoroscopic examination was suspicious for sludge. Choledocholithiasis was found. Complete removal was accomplished by biliary sphincterotomy and balloon sweeping. The very distal biliary duct had a slight narrowing compared  to the majority of the bile duct, though this is not clearly a  stricture and the balloon could not pass through this area with ease after stone extraction."   -Patient has been started on NG tube feedings since 11/30/2022 with Osmolite 1.5 Cal Liquid at 55 mL/hr with Prosource TF20 60 mL/Day and Free Water Flushes 150 mL q4h -Remains on antibiotics per GI and general surgery recommendations with IV Zosyn -GI recommends replacing NG with Cortrak today however cortrak team is not available today.  GI recommending continuing to check liver enzymes continue supportive care along with watching for pancreatitis, cholangitis, perforation and or bleeding -GI recommends continue to monitor supportive patient over the course of the coming days and hopefully he is no evidence of post ERCP pancreatitis or other complications and recommends in the coming days and weeks to consider cholecystectomy pending his overall clinical course from his necrotizing/hemorrhagic pancreatitis; it was felt patient was clinically improved to the point where he cannot undergo surgical intervention until general surgery took him for a laparoscopic cholecystectomy 12/03/22 -Diet advancement per GI and is on a full liquid diet and has Cortrak getting Tube Feedings; from a surgical standpoint and they feel that he is okay to advance diet as tolerated but gastroenterology recommends continuing tube feedings and recommending advancing diet from full liquid diet to heart healthy diet and are hoping that we can advance his diet by Sunday to allow to feedings to be titrated down; patient was able to tolerate oatmeal today and is doing better -Further management and care per GI and general surgery and anticipating discharging once he has been deemed stable and able to tolerate a diet -GI is planning pancreas protocol CT abdomen on 12/06/2022 to see where things stand in regards to the evolution of his pancreatitis and GI hopeful that he will not need any further endoscopic therapies; GI feels that he will continue  to have a protracted course but he is making progress -GI also increasing bowel regimen to try and prevent risk of developing ileus however he is having much more bowel movements and likely related to tube feedings and his bowel regimen but GI wants to rule out C. difficile and ordering a fecal elastase as well and have adjusted his bowel regimen -GI recommending ESR and CRP tomorrow to follow-up on inflammation better and transitioning to oral Protonix if doing well   Bibasilar Atelectasis -Persistent bibasilar atelectasis noted in chest x-ray done on 5/18 and repeated 5/24.   -But he is asymptomatic and not hypoxic.   -Continue Incentive spirometry and will order Flutter Valve -Repeat chest x-ray in the morning   CAD/NSTEMI/stent 2014 HLD -No anginal symptoms currently. -PTA on aspirin 81 mg daily.  Currently on hold given NPO and getting tube feedings.  Will resume once okayed by GI and general surgery -Not on statin as an outpatient   Paroxysmal A-fib  -Was on aspirin daily which is currently on hold.  -Not on anticoagulation as an outpatient -Remains on Med-Surge but  if necessary will transfer to Telemetry    Type 2 Diabetes mellitus Diet controlled.  Not on meds, A1c 5.8 on 11/19/2022 -CBG Trend: Recent Labs  Lab 12/04/22 1218 12/04/22 1622 12/04/22 1939 12/04/22 2339 12/05/22 0451 12/05/22 0727 12/05/22 1216  GLUCAP 172* 103* 160* 149* 184* 227* 170*    GERD/GI Prophylaxis  -Continue PPI with pantoprazole 40 mg IV every 24h and GI is hopeful to transition to orals within next 24 hours if he is doing  well   Bladder Wall Thickening On 3/27, CT abdomen and pelvis showed focal wall thickening in the bladder adjacent to the right UVJ suspicious for neoplasm.  Radiologist recommended cystoscopy for further evaluation. -Urinalysis 11/16/2022 did not show any evidence of hematuria. -Patient/family have not noticed any hematuria thereafter either. -5/22 Dr. Jacqulyn Bath sent an  epic message to on-call urology Dr. McDiarmid, recommended outpatient follow-up for further workup including cystoscopy.    Extensive Sigmoid Diverticulosis  Chronic Constipation -Continue Bowel regimen with MiraLAX 17 g p.o. daily as needed and senna docusate 1 tab p.o. nightly but GI has increased this and added 10 mg of bisacodyl rectal suppositories for moderate constipation and scheduled MiraLAX 17 g daily and have now adjusted his bowel regimen given the patient's diarrhea and have discontinued his scheduled MiraLAX   Rash on the back. -Possible associated with newly linen material. -C/w Hydrocortisone Cream 1% Topically 4 times daily -No complaints at this time   Hyponatremia -Na+ Trend: Recent Labs  Lab 12/01/22 0529 12/02/22 0248 12/02/22 0845 12/03/22 0238 12/03/22 1222 12/04/22 0344 12/05/22 0307  NA 131* 135 134* 135 132* 134* 133*  -Continue to Monitor and Trend -Repeat CMP in the AM   Normocytic Anemia -Hgb/Hct Trend: Recent Labs  Lab 11/29/22 0301 11/30/22 0310 12/01/22 0529 12/02/22 0248 12/03/22 0238 12/04/22 0344 12/05/22 0307  HGB 11.6* 11.8* 12.0* 11.9* 12.0* 11.3* 12.2*  HCT 34.9* 36.0* 36.2* 36.7* 37.2* 34.8* 37.3*  MCV 85.3 88.5 86.4 88.9 87.3 88.1 87.4  -Checked Anemia Panel and showed an iron level of 48, UIBC of 235, TIBC of 283, saturation ratios of 17%, ferritin level of 474, folate of 15.8 and a vitamin B12 of 848 -Continue to Monitor for S/Sx of Bleeding; No overt bleeding noted -Repeat CBC in the AM    Hemolyzed Blood Samples -K+ was elevated this AM and Trend showed: Recent Labs  Lab 12/01/22 0529 12/02/22 0248 12/02/22 0845 12/03/22 0238 12/03/22 1222 12/04/22 0344 12/05/22 0307  K 3.8 6.0* 4.2 5.3* 3.8 3.8 3.6  -Likely Hemolysis so will repeat CMP this AM   Hypoalbuminemia -Patient's Albumin Trend: Recent Labs  Lab 11/30/22 0310 12/01/22 0529 12/02/22 0248 12/02/22 0845 12/03/22 0238 12/04/22 0344 12/05/22 0307   ALBUMIN 2.3* 2.2* 2.4* 2.4* 2.6* 2.4* 2.6*  -Continue to Monitor and Trend and repeat CMP in the AM   DVT prophylaxis: SCDs Start: 11/17/22 1732    Code Status: Full Code Family Communication: Discussed with his wife at bedside  Disposition Plan:  Level of care: Med-Surg Status is: Inpatient Remains inpatient appropriate because: Needs further clinical improvement and clearance by specialists   Consultants:  Gastroenterology General surgery  Procedures:  As delineated as above  ERCP Findings:      The scout film was normal.      The upper GI tract was traversed under direct vision without detailed       examination. 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 Localized       moderate inflammation characterized by adherent blood, congestion       (edema), erythema and friability was found in the gastric antrum. No       other gross lesions were noted in the entire examined stomach (biopsied       for H. pylori). No gross lesions were noted in the duodenal bulb, in the       first portion of the duodenum and in the second portion of the duodenum.  The major papilla was normal in appearance and hidden under multiple       folds.      A short 0.035 inch Soft Jagwire was passed into the biliary tree after       engaging the ampulla with the sphincterotome. The Hydratome       sphincterotome was then passed over the guidewire and the bile duct was       then deeply cannulated. Contrast was injected. I personally interpreted       the bile duct images. Ductal flow of contrast was adequate. Image       quality was adequate. Contrast extended to the entire biliary tree.       Opacification of the entire biliary tree was successful. The maximum       diameter of the ducts was approximately 7 mm. The middle third of the       main bile duct contained filling defect thought to be sludge versus       stone. The very distal bile duct was slightly narrowed compared to the        rest of the biliary tree for approximately 15 mm in length (not clearly       a stenosis). A 7 mm biliary sphincterotomy was made with a monofilament       Hydratome sphincterotome using ERBE electrocautery. There was no       post-sphincterotomy bleeding. To discover objects, the biliary tree was       swept with a retrieval balloon. Sludge was swept from the duct. One       stone was removed. No stones remained. An occlusion cholangiogram was       performed that showed no further significant biliary pathology and the       gallbladder began to fill.      A pancreatogram was not performed.      The duodenoscope was withdrawn from the patient. Impression:               - J-shaped gastric deformity.                           - Gastritis in antrum. No other gross lesions in                            the entire stomach. Biopsied for H. pylori.                           - No gross lesions in the duodenal bulb, in the                            first portion of the duodenum and in the second                            portion of the duodenum.                           - The major papilla appeared normal (though hidden                            under multiple folds).                           -  The fluoroscopic examination was suspicious for                            sludge.                           - Choledocholithiasis was found. Complete removal                            was accomplished by biliary sphincterotomy and                            balloon sweeping.                           - The very distal biliary duct had a slight                            narrowing compared to the majority of the bile                            duct, though this is not clearly a stricture and                            the balloon could not pass through this area with                            ease after stone extraction.. Recommendation:           - The patient will be observed post-procedure,                             until all discharge criteria are met.                           - Return patient to hospital ward for ongoing care.                           - Have placed orders for Cortrack placement                            urgently in an effort of continuing tube feeds.                            This is why did not replace the NG tube today.                           - Observe patient's clinical course.                           - Watch for pancreatitis, bleeding, perforation,                            and cholangitis.                           -  Check liver enzymes (AST, ALT, alkaline                            phosphatase, bilirubin) in the morning.                           - We will continue to monitor and support the                            patient over the course of the coming days.                            Hopefully no evidence of post ERCP pancreatitis or                            other complications will occur. At some point in                            the coming days/weeks we will consider the timing                            of cholecystectomy pending his overall clinical                            course from his necrotizing/hemorrhagic                            pancreatitis.                           - The findings and recommendations were discussed                            with the patient.                           - The findings and recommendations were discussed                            with the patient's family.                           - The findings and recommendations were discussed                            with the referring physician.   Laparoscopic Cholecystectomy by Dr. Phylliss Blakes  Antimicrobials:  Anti-infectives (From admission, onward)    Start     Dose/Rate Route Frequency Ordered Stop   11/25/22 1400  piperacillin-tazobactam (ZOSYN) IVPB 3.375 g        3.375 g 12.5 mL/hr over 240 Minutes Intravenous Every 8 hours  11/25/22 1136     11/18/22 0600  piperacillin-tazobactam (ZOSYN) IVPB 3.375 g  Status:  Discontinued        3.375 g 12.5 mL/hr over 240 Minutes Intravenous Every 8 hours 11/17/22 1753 11/24/22 0949   11/17/22 1845  piperacillin-tazobactam (ZOSYN)  IVPB 3.375 g        3.375 g 100 mL/hr over 30 Minutes Intravenous  Once 11/17/22 1753 11/17/22 2012       Subjective: Examined at bedside he was sleepy this morning but doing fairly well.  Wife is happy that he is able to eat oatmeal.  He is about to walk the halls soon.  States that he is having quite a bit of loose stools and diarrhea and feels fuller with the tube feeding.  No nausea or vomiting and does not really have abdominal discomfort.  No other concerns or complaints this time.  Objective: Vitals:   12/04/22 0820 12/04/22 1620 12/04/22 1943 12/05/22 0458  BP: 134/79 (!) 144/76 130/75 134/76  Pulse: 75 75 73 68  Resp: 16 16 18 18   Temp: 98.6 F (37 C) 97.8 F (36.6 C) 97.9 F (36.6 C) 97.6 F (36.4 C)  TempSrc: Oral Oral Oral Oral  SpO2: 97% 98% 96% 97%  Weight:      Height:        Intake/Output Summary (Last 24 hours) at 12/05/2022 1536 Last data filed at 12/05/2022 1500 Gross per 24 hour  Intake 440 ml  Output --  Net 440 ml   Filed Weights   12/02/22 0500 12/03/22 0429 12/03/22 0807  Weight: 74.9 kg 73.6 kg 73.6 kg   Examination: Physical Exam:  Constitutional: WN/WD elderly Caucasian male in no acute distress and is a little sleepy Respiratory: Diminished to auscultation bilaterally with Coarse breath sounds, no wheezing, rales, rhonchi or crackles. Normal respiratory effort and patient is not tachypenic. No accessory muscle use.  Unlabored breathing  Cardiovascular: RRR, no murmurs / rubs / gallops. S1 and S2 auscultated. No extremity edema.  Abdomen: Soft, very mildly tender. Mildly distended. Bowel sounds positive.  Abdominal incisions appear to clean dry and intact GU: Deferred. Musculoskeletal: No clubbing /  cyanosis of digits/nails. No joint deformity upper and lower extremities.  Skin: No rashes, lesions, ulcers on limited skin evaluation. No induration; Warm and dry.  Neurologic: CN 2-12 grossly intact with no focal deficits.  Romberg sign and cerebellar reflexes not assessed.  Psychiatric: Normal judgment and insight. Alert and oriented x 3. Normal mood and appropriate affect.   Data Reviewed: I have personally reviewed following labs and imaging studies  CBC: Recent Labs  Lab 11/30/22 0310 12/01/22 0529 12/02/22 0248 12/03/22 0238 12/04/22 0344 12/05/22 0307  WBC 13.2* 11.9* 11.4* 11.1* 12.6* 11.1*  NEUTROABS 8.0* 7.1  --  6.9 7.7 6.2  HGB 11.8* 12.0* 11.9* 12.0* 11.3* 12.2*  HCT 36.0* 36.2* 36.7* 37.2* 34.8* 37.3*  MCV 88.5 86.4 88.9 87.3 88.1 87.4  PLT 355 358 385 422* 373 410*   Basic Metabolic Panel: Recent Labs  Lab 12/01/22 0529 12/01/22 1710 12/02/22 0248 12/02/22 0845 12/03/22 0238 12/03/22 1222 12/04/22 0344 12/05/22 0307  NA 131*  --    < > 134* 135 132* 134* 133*  K 3.8  --    < > 4.2 5.3* 3.8 3.8 3.6  CL 96*  --    < > 100 100 99 100 99  CO2 24  --    < > 26 27 25 25 24   GLUCOSE 253*  --    < > 161* 157* 196* 230* 226*  BUN 15  --    < > 13 15 14 15 14   CREATININE 0.91  --    < > 0.96 1.07 1.08 0.94 0.92  CALCIUM 8.0*  --    < >  8.2* 8.9 8.1* 8.1* 8.4*  MG 2.0 2.0  --   --  2.2  --  1.9 1.9  PHOS 3.2 2.6  --   --  2.7  --  3.0 2.7   < > = values in this interval not displayed.   GFR: Estimated Creatinine Clearance: 58.9 mL/min (by C-G formula based on SCr of 0.92 mg/dL). Liver Function Tests: Recent Labs  Lab 12/02/22 0248 12/02/22 0845 12/03/22 0238 12/04/22 0344 12/05/22 0307  AST 28 31 31  46* 42*  ALT 23 25 30  41 42  ALKPHOS 62 59 64 54 61  BILITOT 0.7 0.8 0.7 0.6 0.7  PROT 6.8 6.6 7.2 6.3* 7.1  ALBUMIN 2.4* 2.4* 2.6* 2.4* 2.6*   No results for input(s): "LIPASE", "AMYLASE" in the last 168 hours. No results for input(s): "AMMONIA" in the  last 168 hours. Coagulation Profile: Recent Labs  Lab 12/02/22 0248  INR 1.1   Cardiac Enzymes: No results for input(s): "CKTOTAL", "CKMB", "CKMBINDEX", "TROPONINI" in the last 168 hours. BNP (last 3 results) No results for input(s): "PROBNP" in the last 8760 hours. HbA1C: No results for input(s): "HGBA1C" in the last 72 hours. CBG: Recent Labs  Lab 12/04/22 1939 12/04/22 2339 12/05/22 0451 12/05/22 0727 12/05/22 1216  GLUCAP 160* 149* 184* 227* 170*   Lipid Profile: No results for input(s): "CHOL", "HDL", "LDLCALC", "TRIG", "CHOLHDL", "LDLDIRECT" in the last 72 hours. Thyroid Function Tests: No results for input(s): "TSH", "T4TOTAL", "FREET4", "T3FREE", "THYROIDAB" in the last 72 hours. Anemia Panel: Recent Labs    12/03/22 0238  VITAMINB12 848  FOLATE 15.8  FERRITIN 474*  TIBC 283  IRON 48  RETICCTPCT 1.2   Sepsis Labs: No results for input(s): "PROCALCITON", "LATICACIDVEN" in the last 168 hours.  No results found for this or any previous visit (from the past 240 hour(s)).   Radiology Studies: No results found.  Scheduled Meds:  feeding supplement  1 Container Oral TID BM   feeding supplement (PROSource TF20)  60 mL Per Tube Daily   free water  150 mL Per Tube Q4H   guaiFENesin  600 mg Oral BID   hydrocortisone cream   Topical QID   insulin aspart  0-9 Units Subcutaneous Q4H   pantoprazole (PROTONIX) IV  40 mg Intravenous Q24H   sodium chloride  1 drop Both Eyes QHS   Continuous Infusions:  feeding supplement (OSMOLITE 1.5 CAL) 1,000 mL (12/04/22 2140)   methocarbamol (ROBAXIN) IV     piperacillin-tazobactam (ZOSYN)  IV 3.375 g (12/05/22 0911)    LOS: 18 days   Marguerita Merles, DO [facility] Available via Epic secure chat 7am-7pm After these hours, please refer to coverage provider listed on [email or web address] 12/05/2022, 3:36 PM

## 2022-12-05 NOTE — Progress Notes (Signed)
Gastroenterology Inpatient Follow-up Note   PATIENT IDENTIFICATION  Jon Bray is a 82 y.o. male with a pmh significant for CAD, hypertension, atrial fibrillation, hyperlipidemia, chronic renal insufficiency, nephrolithiasis, cholelithiasis, gallstone pancreatitis with retained biliary stone.  Still hospitalized with necrotizing/hemorrhagic pancreatitis and previously failed ERCP and now status post biliary sphincterotomy and stone extraction and now status post cholecystectomy. Hospital Day: 23  SUBJECTIVE  The patient's chart has been reviewed. The patient's labs have been reviewed. Patient has begun having but is a bit disconcerted that he is having so many bowel movements after eating anything. Patient is having no worsening of abdominal pain but still has discomfort. He is tolerating his core track feedings. He does feel like he has a bit more appetite which is good however. No fevers or chills.   OBJECTIVE  Scheduled Inpatient Medications:   feeding supplement  1 Container Oral TID BM   feeding supplement (PROSource TF20)  60 mL Per Tube Daily   free water  150 mL Per Tube Q4H   guaiFENesin  600 mg Oral BID   hydrocortisone cream   Topical QID   insulin aspart  0-9 Units Subcutaneous Q4H   pantoprazole (PROTONIX) IV  40 mg Intravenous Q24H   polyethylene glycol  17 g Oral Daily   senna-docusate  1 tablet Oral QHS   sodium chloride  1 drop Both Eyes QHS   Continuous Inpatient Infusions:   feeding supplement (OSMOLITE 1.5 CAL) 1,000 mL (12/04/22 2140)   methocarbamol (ROBAXIN) IV     piperacillin-tazobactam (ZOSYN)  IV 3.375 g (12/05/22 0911)   PRN Inpatient Medications: acetaminophen **OR** acetaminophen, bisacodyl, HYDROmorphone (DILAUDID) injection, methocarbamol (ROBAXIN) IV, ondansetron **OR** ondansetron (ZOFRAN) IV, oxyCODONE, phenol, polyethylene glycol, polyvinyl alcohol, simethicone   Physical Examination  Temp:  [97.6 F (36.4 C)-97.9 F (36.6 C)] 97.6 F  (36.4 C) (06/02 0458) Pulse Rate:  [68-75] 68 (06/02 0458) Resp:  [16-18] 18 (06/02 0458) BP: (130-144)/(75-76) 134/76 (06/02 0458) SpO2:  [96 %-98 %] 97 % (06/02 0458) Temp (24hrs), Avg:97.8 F (36.6 C), Min:97.6 F (36.4 C), Max:97.9 F (36.6 C)  Weight: 73.6 kg GEN: No acute distress, resting in his chair with wife at bedside PSYCH: Cooperative, without pressured speech EYE: Conjunctivae pink, sclerae anicteric ENT: MMM, core track in place CV: Nontachycardic RESP: No audible wheezing GI: TTP in MEG and at incisions (improved from prior), no rebound MSK/EXT: No significant lower extremity edema SKIN: No jaundice NEURO:  Alert & Oriented x 3, no focal deficits   Review of Data   Laboratory Studies   Recent Labs  Lab 12/03/22 0238 12/03/22 1222 12/04/22 0344 12/05/22 0307  NA 135   < > 134* 133*  K 5.3*   < > 3.8 3.6  CL 100   < > 100 99  CO2 27   < > 25 24  BUN 15   < > 15 14  CREATININE 1.07   < > 0.94 0.92  GLUCOSE 157*   < > 230* 226*  CALCIUM 8.9   < > 8.1* 8.4*  MG 2.2  --  1.9 1.9  PHOS 2.7  --  3.0 2.7   < > = values in this interval not displayed.   Recent Labs  Lab 12/05/22 0307  AST 42*  ALT 42  ALKPHOS 61    Recent Labs  Lab 12/03/22 0238 12/04/22 0344 12/05/22 0307  WBC 11.1* 12.6* 11.1*  HGB 12.0* 11.3* 12.2*  HCT 37.2* 34.8* 37.3*  PLT 422*  373 410*   Recent Labs  Lab 12/02/22 0248  INR 1.1   Imaging Studies  DG Abd Portable 1V  Result Date: 12/03/2022 CLINICAL DATA:  Feeding tube placement EXAM: PORTABLE ABDOMEN - 1 VIEW COMPARISON:  11/29/2022 FINDINGS: Weighted tip feeding tube placement, tip near the pylorus. Stomach decompressed. Cholecystectomy clips. Normal bowel gas pattern. Aortic Atherosclerosis (ICD10-170.0). IMPRESSION: Feeding tube tip near the pylorus. Electronically Signed   By: Corlis Leak M.D.   On: 12/03/2022 15:17    GI Procedures and Studies  No new procedures to review   ASSESSMENT  [name] is a 82 y.o.  male with a pmh significant for CAD, hypertension, atrial fibrillation, hyperlipidemia, chronic renal insufficiency, nephrolithiasis, cholelithiasis, gallstone pancreatitis with retained biliary stone.  Still hospitalized with necrotizing/hemorrhagic pancreatitis and previously failed ERCP and now status post biliary sphincterotomy and stone extraction and now status post cholecystectomy.  The patient is hemodynamically stable today.  Clinically, he does look to have improvement because his bowels are moving.  Yesterday he was close to having some potential for an ileus so seeing his bowels open up is good.  He is more concerned about this however.  He certainly is at risk of a few different reasons for diarrhea including opening up from an ileus, promotility agents (MiraLAX and Senokot and Dulcolax on board), antibiotic associated diarrhea (including C. 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  I would like the patient to undergo further testing to rule out C. difficile and also to evaluate his fecal elastase.  Depending on how he is doing in the course the next couple of days, I suspect that he will continue to have improvement with more solidification of his bowels.  If he does not then we will consider cholestyramine +/- Creon (pending fecal elastase).  I am going to have the patient undergo a pancreas protocol CT abdomen tomorrow (7 days from his last imaging study) to see where things stand in regards to the evolution of his pancreatitis.  Hopefully he will not need further endoscopic therapies.  Hopefully he also will be able to advance his diet slowly, and with this time I suspect he will be able to get the core track removed and move forward with enteral feeding completely by mouth.  Lets see where things stand after the CT scan tomorrow and hopefully he will be able to get out of here by the end  of the week.  It will be a protracted course overall but I am certainly glad that he has made significant progress within this last week of service while I been on.  The patient and wife are appreciative for the care they have received through the [facility].   PLAN/RECOMMENDATIONS  Continue core track feedings Full liquid diet to heart healthy diet as patient sees he can advance over the course the coming days Pancreas protocol CT abdomen on 6/3 (I have placed order for it for tomorrow) Expect a protracted course due to the pancreatic necrosis/hemorrhagic necrosis and need for cholecystectomy healing Send C. difficile PCR Send fecal elastase I have updated his bowel regimen ESR/CRP tomorrow to follow-up inflammation pattern Can likely transition Protonix to oral within next 24 hours if he is doing well   The inpatient GI service for [facility] will be taken over by Dr. Barron Alvine starting tomorrow.  Please page/call with questions or concerns.   Corliss Parish, MD  Gastroenterology Advanced Endoscopy Office # [phone]  LOS: 18 days  [facility]  12/05/2022, 1:06 PM

## 2022-12-06 ENCOUNTER — Inpatient Hospital Stay (HOSPITAL_COMMUNITY): Payer: HMO

## 2022-12-06 ENCOUNTER — Encounter (HOSPITAL_COMMUNITY): Payer: Self-pay | Admitting: Gastroenterology

## 2022-12-06 DIAGNOSIS — K8050 Calculus of bile duct without cholangitis or cholecystitis without obstruction: Secondary | ICD-10-CM | POA: Diagnosis not present

## 2022-12-06 DIAGNOSIS — K8591 Acute pancreatitis with uninfected necrosis, unspecified: Secondary | ICD-10-CM | POA: Diagnosis not present

## 2022-12-06 DIAGNOSIS — K851 Biliary acute pancreatitis without necrosis or infection: Secondary | ICD-10-CM | POA: Diagnosis not present

## 2022-12-06 DIAGNOSIS — K859 Acute pancreatitis without necrosis or infection, unspecified: Secondary | ICD-10-CM | POA: Diagnosis not present

## 2022-12-06 DIAGNOSIS — Z9889 Other specified postprocedural states: Secondary | ICD-10-CM | POA: Diagnosis not present

## 2022-12-06 LAB — COMPREHENSIVE METABOLIC PANEL
ALT: 39 U/L (ref 0–44)
AST: 33 U/L (ref 15–41)
Albumin: 2.5 g/dL — ABNORMAL LOW (ref 3.5–5.0)
Alkaline Phosphatase: 66 U/L (ref 38–126)
Anion gap: 13 (ref 5–15)
BUN: 14 mg/dL (ref 8–23)
CO2: 24 mmol/L (ref 22–32)
Calcium: 8.3 mg/dL — ABNORMAL LOW (ref 8.9–10.3)
Chloride: 97 mmol/L — ABNORMAL LOW (ref 98–111)
Creatinine, Ser: 0.84 mg/dL (ref 0.61–1.24)
GFR, Estimated: >60 mL/min (ref >60)
Glucose, Bld: 234 mg/dL — ABNORMAL HIGH (ref 70–99)
Potassium: 4 mmol/L (ref 3.5–5.1)
Sodium: 134 mmol/L — ABNORMAL LOW (ref 135–145)
Total Bilirubin: 0.5 mg/dL (ref 0.3–1.2)
Total Protein: 6.7 g/dL (ref 6.5–8.1)

## 2022-12-06 LAB — CBC WITH DIFFERENTIAL/PLATELET
Abs Immature Granulocytes: 0.03 10*3/uL (ref 0.00–0.07)
Basophils Absolute: 0.1 10*3/uL (ref 0.0–0.1)
Basophils Relative: 1 %
Eosinophils Absolute: 0.3 10*3/uL (ref 0.0–0.5)
Eosinophils Relative: 3 %
HCT: 37.7 % — ABNORMAL LOW (ref 39.0–52.0)
Hemoglobin: 12.4 g/dL — ABNORMAL LOW (ref 13.0–17.0)
Immature Granulocytes: 0 %
Lymphocytes Relative: 34 %
Lymphs Abs: 3.5 10*3/uL (ref 0.7–4.0)
MCH: 28.4 pg (ref 26.0–34.0)
MCHC: 32.9 g/dL (ref 30.0–36.0)
MCV: 86.5 fL (ref 80.0–100.0)
Monocytes Absolute: 0.8 10*3/uL (ref 0.1–1.0)
Monocytes Relative: 8 %
Neutro Abs: 5.5 10*3/uL (ref 1.7–7.7)
Neutrophils Relative %: 54 %
Platelets: 400 10*3/uL (ref 150–400)
RBC: 4.36 MIL/uL (ref 4.22–5.81)
RDW: 13.9 % (ref 11.5–15.5)
WBC: 10.2 10*3/uL (ref 4.0–10.5)
nRBC: 0 % (ref 0.0–0.2)

## 2022-12-06 LAB — SURGICAL PATHOLOGY

## 2022-12-06 LAB — C-REACTIVE PROTEIN: CRP: 1.8 mg/dL — ABNORMAL HIGH (ref <1.0)

## 2022-12-06 LAB — GLUCOSE, CAPILLARY
Glucose-Capillary: 189 mg/dL — ABNORMAL HIGH (ref 70–99)
Glucose-Capillary: 200 mg/dL — ABNORMAL HIGH (ref 70–99)
Glucose-Capillary: 207 mg/dL — ABNORMAL HIGH (ref 70–99)
Glucose-Capillary: 208 mg/dL — ABNORMAL HIGH (ref 70–99)
Glucose-Capillary: 209 mg/dL — ABNORMAL HIGH (ref 70–99)
Glucose-Capillary: 255 mg/dL — ABNORMAL HIGH (ref 70–99)

## 2022-12-06 LAB — MAGNESIUM: Magnesium: 1.9 mg/dL (ref 1.7–2.4)

## 2022-12-06 LAB — SEDIMENTATION RATE: Sed Rate: 52 mm/hr — ABNORMAL HIGH (ref 0–16)

## 2022-12-06 LAB — PHOSPHORUS: Phosphorus: 2.8 mg/dL (ref 2.5–4.6)

## 2022-12-06 MED ORDER — INSULIN ASPART 100 UNIT/ML IJ SOLN
2.0000 [IU] | INTRAMUSCULAR | Status: DC
  Administered 2022-12-06 – 2022-12-07 (×7): 2 [IU] via SUBCUTANEOUS

## 2022-12-06 MED ORDER — IOHEXOL 350 MG/ML SOLN
75.0000 mL | Freq: Once | INTRAVENOUS | Status: AC | PRN
  Administered 2022-12-06: 75 mL via INTRAVENOUS

## 2022-12-06 NOTE — TOC Progression Note (Signed)
Transition of Care [facility]) - Progression Note    Patient Details  Name: Jon Bray MRN: [id number] Date of Birth: 25-Aug-1940  Transition of Care [facility]) CM/SW Contact  Kermit Balo, RN Phone Number: 12/06/2022, 1:38 PM  Clinical Narrative:    Prior failed ERCP now status post biliary sphincterotomy and stone extraction, and status post cholecystectomy. Having CT of abd today and hope to start weaning tube feeds.  TOC following for d/c needs.     Barriers to Discharge: Other (must enter comment) (possible ERCP attempt next week due to MD not available until next week)  Expected Discharge Plan and Services                                               Social Determinants of Health (SDOH) Interventions SDOH Screenings   Food Insecurity: No Food Insecurity (11/17/2022)  Housing: Low Risk  (11/17/2022)  Transportation Needs: No Transportation Needs (11/17/2022)  Utilities: Not At Risk (11/17/2022)  Depression (PHQ2-9): Low Risk  (11/17/2022)  Financial Resource Strain: Low Risk  (11/17/2022)  Physical Activity: Sufficiently Active (11/17/2022)  Social Connections: Socially Integrated (11/17/2022)  Stress: No Stress Concern Present (11/17/2022)  Tobacco Use: Low Risk  (12/06/2022)    Readmission Risk Interventions     No data to display

## 2022-12-06 NOTE — Progress Notes (Signed)
Occupational Therapy Treatment Patient Details Name: Jon Bray MRN: [id number] DOB: 13-Mar-1941 Today's Date: 12/06/2022   History of present illness Mr. Brodeur is a 82 y.o. male hospitalized with necrotizing/hemorrhagic pancreatitis and previously failed ERCP and now status post biliary sphincterotomy and stone extraction and now status post cholecystectomy. PMH significant for CAD, hypertension, atrial fibrillation, hyperlipidemia, chronic renal insufficiency, nephrolithiasis, cholelithiasis, gallstone pancreatitis with retained biliary stone.   OT comments  Pt in chair upon arrival with his wife present. Pt and wife instructed on and provided with UE strengthening exercises with use of green level 3 theraband to increase UB/UE strength and activity tolerance; handouts provided. Pt may require set up - prn assist with ADL tasks due to decreased activity tolerance and abdominal/shoulder pain. OT will continue to follow acutely to maximize level of function and safety   Recommendations for follow up therapy are one component of a multi-disciplinary discharge planning process, led by the attending physician.  Recommendations may be updated based on patient status, additional functional criteria and insurance authorization.    Assistance Recommended at Discharge PRN  Patient can return home with the following  Assistance with cooking/housework;Assist for transportation   Equipment Recommendations  None recommended by OT    Recommendations for Other Services      Precautions / Restrictions Precautions Precautions: Fall Precaution Comments: Cortrak Restrictions Weight Bearing Restrictions: No       Mobility Bed Mobility               General bed mobility comments: pt received sitting up in chair    Transfers                         Balance                                           ADL either performed or assessed with clinical judgement   ADL                                          General ADL Comments: Pain and decreased activity tolerance may limit ability to complete without set up    Extremity/Trunk Assessment Upper Extremity Assessment Upper Extremity Assessment: Overall WFL for tasks assessed   Lower Extremity Assessment Lower Extremity Assessment: Defer to PT evaluation   Cervical / Trunk Assessment Cervical / Trunk Assessment: Normal    Vision Baseline Vision/History: 1 Wears glasses Ability to See in Adequate Light: 0 Adequate Patient Visual Report: No change from baseline     Perception     Praxis      Cognition Arousal/Alertness: Awake/alert Behavior During Therapy: WFL for tasks assessed/performed Overall Cognitive Status: Within Functional Limits for tasks assessed                                          Exercises Other Exercises Other Exercises: Pt and wife instructed on and provided with UE strengthening exercises with use of green level 3 theraband. Pt's wife has green dumbells (3 lbs?) in pt's room and states that they go to the gym regularly    Shoulder Instructions       General  Comments      Pertinent Vitals/ Pain       Pain Assessment Pain Assessment: 0-10 Pain Score: 4  Pain Location: abdomen, R shoulder Pain Descriptors / Indicators: Aching, Discomfort, Grimacing, Guarding, Sore Pain Intervention(s): Limited activity within patient's tolerance, Monitored during session  Home Living                                          Prior Functioning/Environment              Frequency  Min 2X/week        Progress Toward Goals  OT Goals(current goals can now be found in the care plan section)  Progress towards OT goals: Progressing toward goals     Plan Discharge plan remains appropriate    Co-evaluation                 AM-PAC OT "6 Clicks" Daily Activity     Outcome Measure   Help from another person  eating meals?: Total (Cortrak in place) Help from another person taking care of personal grooming?: None Help from another person toileting, which includes using toliet, bedpan, or urinal?: None Help from another person bathing (including washing, rinsing, drying)?: None Help from another person to put on and taking off regular upper body clothing?: None Help from another person to put on and taking off regular lower body clothing?: None 6 Click Score: 21    End of Session    OT Visit Diagnosis: Muscle weakness (generalized) (M62.81)   Activity Tolerance Patient tolerated treatment well   Patient Left in chair;with call bell/phone within reach;with family/visitor present   Nurse Communication          Time: 1610-9604 OT Time Calculation (min): 16 min  Charges: OT General Charges $OT Visit: 1 Visit OT Treatments $Therapeutic Exercise: 8-22 mins    Galen Manila 12/06/2022, 1:24 PM

## 2022-12-06 NOTE — Progress Notes (Signed)
PROGRESS NOTE    Jon Bray  ZOX:[id number] DOB: 1941/01/08 DOA: 11/17/2022 PCP: Danella Penton, MD   Brief Narrative:  Jon Bray is a 82 y.o. male with PMH significant for DM2, HTN, HLD, CAD/NSTEMI/stent 2014, paroxysmal A-fib 5/14, patient was brought to the ED at [facility] with complaint of worsening nausea, vomiting.   In March, patient saw his primary care provider for loss of appetite, weight loss. Workup showed elevated liver enzymes and bilirubin.  3/27 CT abdomen and pelvis showed focal wall thickening in the urinary bladder adjacent to right UVJ suspicious for neoplasm, also extensive sigmoid diverticulosis 3/28, MRI/MRCP showed choledocholithiasis with 2 tiny gallstones near the ampulla measuring 0.2 to 0.3 cm and mild intra and extrahepatic biliary duct dilatation, CBD dilated to 0.9 cm Patient refused to go to ED for urgent evaluation.  PCP sent referral to general surgery and GI 4/9, seen by surgeon Dr. Trisha Mangle.  Cholecystectomy was recommended but patient wanted to wait it out.   5/14, patient was brought to the ED for 3 days of nausea, vomiting. Initial workup showed lipase elevated over 10,000, LFTs elevated as well MRI abdomen showed acute interstitial pancreatitis, choledocholithiasis with 4 mm stone in distal CBD. He was started on conservative management with IV fluid, IV analgesia, IV antiemetics, bowel rest Admitted to [facility] GI and general surgery were consulted. 5/20, ERCP was attempted but had to be aborted due to inability to identify the papula.  Follow-up MRCP showed persistent small stone in the CBD, changes of pancreatitis along with small fluid collection. Overall, liver enzymes continue to improve, Underwent ERCP and Stone Extractionby Dr. Meridee Score (12/02/22).  Tube feedings were initiated on 11/30/2022 but NG has been removed and Cortrak to be replaced.   **Patient now underwent laparoscopic cholecystectomy on 12/03/22 given that he felt he was stable for  surgery and because it was clinically felt that his pancreatitis seems to be improving.  Gastroenterology recommends continuing and placing a cortrak to allow time for his pancreatitis and cholecystectomy that he also this was placed and Tube feedings were resumed.  He remains on IV Zosyn.  Nephrology is repeating a CT pancreas protocol.   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 -GI and general surgery following -Pancreatitis seems to have improved.  Currently was  to tolerate regular consistency diet and LFTs normalized and bilirubin fairly stable but  Underwent MRCP 11/29/2022 which unfortunately shows worsening pancreatitis with small area of pancreatic necrosis at the head with possibly evolving hemorrhage and enlarging pseudocyst.   -It noted Pancreatic inflammation exerts mass effect on distal CBD, there continues to be 3 mm filling defect in distal CBD suspicious for choledocholithiasis.   -WBC Trend: Recent Labs  Lab 11/30/22 0310 12/01/22 0529 12/02/22 0248 12/03/22 0238 12/04/22 0344 12/05/22 0307 12/06/22 0450  WBC 13.2* 11.9* 11.4* 11.1* 12.6* 11.1* 10.2  -Underwent ERCP by Dr. Meridee Score and it showed " J-shaped gastric deformity. Gastritis in antrum. No other gross lesions in the entire stomach. Biopsied for H. pylori. No gross lesions in the duodenal bulb, in the first portion of the duodenum and in the second portion of the duodenum. The major papilla appeared normal (though hidden under multiple folds).The fluoroscopic examination was suspicious for sludge. Choledocholithiasis was found. Complete removal was accomplished by biliary sphincterotomy and balloon sweeping. The very  distal biliary duct had a slight narrowing compared to the majority  of the bile duct, though this is not clearly a stricture and the balloon could not pass through this area with ease after stone extraction."   -Patient has been started on NG tube feedings since 11/30/2022 with Osmolite 1.5 Cal Liquid at 55 mL/hr with Prosource TF20 60 mL/Day and Free Water Flushes 150 mL q4h -Remains on antibiotics per GI and general surgery recommendations with IV Zosyn -GI recommends continue to monitor supportive patient over the course of the coming days and hopefully he is no evidence of post ERCP pancreatitis or other complications and recommends in the coming days and weeks to consider cholecystectomy pending his overall clinical course from his necrotizing/hemorrhagic pancreatitis; it was felt patient was clinically improved to the point where he cannot undergo surgical intervention and General surgery took him for a laparoscopic cholecystectomy 12/03/22; Now Surgery feels he is stable from their standpoint and has signed off the Case and have scheduled outpatient follow-up status post cholecystectomy -Diet advancement per GI and is on a full liquid diet and has Cortrak getting Tube Feedings; from a surgical standpoint and they feel that he is okay to advance diet as tolerated but gastroenterology recommends continuing tube feedings and recommending advancing diet from full liquid diet to heart healthy diet and are hoping that we can advance his diet by Sunday to allow to feedings to be titrated down; patient was able to tolerate oatmeal today and is doing better -Further management and care per GI and general surgery and anticipating discharging once he has been deemed stable and able to tolerate a diet -GI is planning pancreas protocol CT abdomen on 12/06/2022 to see where things stand in regards to the evolution of his pancreatitis and GI hopeful that he will not need any further endoscopic therapies; GI feels that he will continue to have a protracted course but he is making progress  and currently the scan is pending to be done and read -GI also increasing bowel regimen to try and prevent risk of developing ileus however he is having much more bowel movements and likely related to tube feedings and his bowel regimen but GI wants to rule out C. difficile and ordering a fecal elastase as well and have adjusted his bowel regimen; C. difficile been negative and fecal elastase is pending  -GI recommending recommending repeating ESR and CRP to follow-up on inflammation better and ESR was 52 and CRP was 1.8 -Transitioning to oral Protonix if doing well tomorrow and awaiting results of the CT scan and depending on the CT results GI will start weaning the tube feedings   Bibasilar Atelectasis -Persistent bibasilar atelectasis noted in chest x-ray done on 5/18 and repeated 5/24.   -But he is asymptomatic and not hypoxic.   -Continue Incentive spirometry and will order Flutter Valve -Repeat chest x-ray in the morning   CAD/NSTEMI/stent 2014 HLD -No anginal symptoms currently. -PTA on aspirin 81 mg daily.  Currently on hold given NPO and getting tube feedings.  Will resume once okayed by GI and general surgery -Not on statin as an outpatient   Paroxysmal A-fib  -Was on aspirin daily which is currently on hold.  -Not on anticoagulation as an outpatient -Remains on Med-Surge but  if necessary will transfer to Telemetry    Type 2 Diabetes mellitus Diet controlled.  Not on meds, A1c 5.8 on 11/19/2022 -CBG Trend: Recent Labs  Lab 12/05/22 1216 12/05/22 1630 12/05/22 2048 12/06/22 0012  12/06/22 0445 12/06/22 0752 12/06/22 1121  GLUCAP 170* 213* 226* 207* 209* 208* 189*    GERD/GI Prophylaxis  -Continue PPI with pantoprazole 40 mg IV every 24h and GI is hopeful to transition to orals within next 24 hours if he is doing well   Bladder Wall Thickening On 3/27, CT abdomen and pelvis showed focal wall thickening in the bladder adjacent to the right UVJ suspicious for neoplasm.   Radiologist recommended cystoscopy for further evaluation. -Urinalysis 11/16/2022 did not show any evidence of hematuria. -Patient/family have not noticed any hematuria thereafter either. -5/22 Dr. Jacqulyn Bath sent an epic message to on-call urology Dr. McDiarmid, recommended outpatient follow-up for further workup including cystoscopy.    Extensive Sigmoid Diverticulosis  Chronic Constipation -Continue Bowel regimen with MiraLAX 17 g p.o. daily as needed and senna docusate 1 tab p.o. nightly but GI has increased this and added 10 mg of bisacodyl rectal suppositories for moderate constipation and scheduled MiraLAX 17 g daily and have now adjusted his bowel regimen given the patient's diarrhea and have discontinued his scheduled MiraLAX   Rash on the back, improved -Possible associated with newly linen material. -Hydrocortisone Cream 1% Topically 4 times daily now stopped -No complaints at this time   Hyponatremia -Na+ Trend: Recent Labs  Lab 12/02/22 0248 12/02/22 0845 12/03/22 0238 12/03/22 1222 12/04/22 0344 12/05/22 0307 12/06/22 0450  NA 135 134* 135 132* 134* 133* 134*  -Continue to Monitor and Trend -Repeat CMP in the AM   Normocytic Anemia -Hgb/Hct Trend: Recent Labs  Lab 11/30/22 0310 12/01/22 0529 12/02/22 0248 12/03/22 0238 12/04/22 0344 12/05/22 0307 12/06/22 0450  HGB 11.8* 12.0* 11.9* 12.0* 11.3* 12.2* 12.4*  HCT 36.0* 36.2* 36.7* 37.2* 34.8* 37.3* 37.7*  MCV 88.5 86.4 88.9 87.3 88.1 87.4 86.5  -Checked Anemia Panel and showed an iron level of 48, UIBC of 235, TIBC of 283, saturation ratios of 17%, ferritin level of 474, folate of 15.8 and a vitamin B12 of 848 -Continue to Monitor for S/Sx of Bleeding; No overt bleeding noted -Repeat CBC in the AM    Hemolyzed Blood Samples -K+ was elevated this AM and Trend showed: Recent Labs  Lab 12/02/22 0248 12/02/22 0845 12/03/22 0238 12/03/22 1222 12/04/22 0344 12/05/22 0307 12/06/22 0450  K 6.0* 4.2 5.3* 3.8  3.8 3.6 4.0  -Likely Hemolysis so will repeat CMP this AM   Hypoalbuminemia -Patient's Albumin Trend: Recent Labs  Lab 12/01/22 0529 12/02/22 0248 12/02/22 0845 12/03/22 0238 12/04/22 0344 12/05/22 0307 12/06/22 0450  ALBUMIN 2.2* 2.4* 2.4* 2.6* 2.4* 2.6* 2.5*  -Continue to Monitor and Trend and repeat CMP in the AM   DVT prophylaxis: SCDs Start: 11/17/22 1732    Code Status: Full Code Family Communication: Discussed with the wife at bedside  Disposition Plan:  Level of care: Med-Surg Status is: Inpatient Remains inpatient appropriate because: Needs further clinical improvement and clearance by the GI physician and will need to tolerate a soft diet prior to discharging home   Consultants:  Gastroenterology General Surgery  Procedures:  As delineated as above   ERCP Findings:      The scout film was normal.      The upper GI tract was traversed under direct vision without detailed       examination. 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 Localized       moderate inflammation characterized by adherent blood, congestion       (edema), erythema and friability was found in the gastric antrum.  No       other gross lesions were noted in the entire examined stomach (biopsied       for H. pylori). No gross lesions were noted in the duodenal bulb, in the       first portion of the duodenum and in the second portion of the duodenum.       The major papilla was normal in appearance and hidden under multiple       folds.      A short 0.035 inch Soft Jagwire was passed into the biliary tree after       engaging the ampulla with the sphincterotome. The Hydratome       sphincterotome was then passed over the guidewire and the bile duct was       then deeply cannulated. Contrast was injected. I personally interpreted       the bile duct images. Ductal flow of contrast was adequate. Image       quality was adequate. Contrast extended to the entire biliary tree.        Opacification of the entire biliary tree was successful. The maximum       diameter of the ducts was approximately 7 mm. The middle third of the       main bile duct contained filling defect thought to be sludge versus       stone. The very distal bile duct was slightly narrowed compared to the       rest of the biliary tree for approximately 15 mm in length (not clearly       a stenosis). A 7 mm biliary sphincterotomy was made with a monofilament       Hydratome sphincterotome using ERBE electrocautery. There was no       post-sphincterotomy bleeding. To discover objects, the biliary tree was       swept with a retrieval balloon. Sludge was swept from the duct. One       stone was removed. No stones remained. An occlusion cholangiogram was       performed that showed no further significant biliary pathology and the       gallbladder began to fill.      A pancreatogram was not performed.      The duodenoscope was withdrawn from the patient. Impression:               - J-shaped gastric deformity.                           - Gastritis in antrum. No other gross lesions in                            the entire stomach. Biopsied for H. pylori.                           - No gross lesions in the duodenal bulb, in the                            first portion of the duodenum and in the second                            portion of the duodenum.                           -  The major papilla appeared normal (though hidden                            under multiple folds).                           - The fluoroscopic examination was suspicious for                            sludge.                           - Choledocholithiasis was found. Complete removal                            was accomplished by biliary sphincterotomy and                            balloon sweeping.                           - The very distal biliary duct had a slight                            narrowing compared to the  majority of the bile                            duct, though this is not clearly a stricture and                            the balloon could not pass through this area with                            ease after stone extraction.. Recommendation:           - The patient will be observed post-procedure,                            until all discharge criteria are met.                           - Return patient to hospital ward for ongoing care.                           - Have placed orders for Cortrack placement                            urgently in an effort of continuing tube feeds.                            This is why did not replace the NG tube today.                           - Observe patient's clinical course.                           -  Watch for pancreatitis, bleeding, perforation,                            and cholangitis.                           - Check liver enzymes (AST, ALT, alkaline                            phosphatase, bilirubin) in the morning.                           - We will continue to monitor and support the                            patient over the course of the coming days.                            Hopefully no evidence of post ERCP pancreatitis or                            other complications will occur. At some point in                            the coming days/weeks we will consider the timing                            of cholecystectomy pending his overall clinical                            course from his necrotizing/hemorrhagic                            pancreatitis.                           - The findings and recommendations were discussed                            with the patient.                           - The findings and recommendations were discussed                            with the patient's family.                           - The findings and recommendations were discussed                            with the referring  physician.   Laparoscopic Cholecystectomy by Dr. Phylliss Blakes  Antimicrobials:  Anti-infectives (From admission, onward)    Start     Dose/Rate Route Frequency Ordered Stop   11/25/22 1400  piperacillin-tazobactam (ZOSYN) IVPB 3.375 g  3.375 g 12.5 mL/hr over 240 Minutes Intravenous Every 8 hours 11/25/22 1136     11/18/22 0600  piperacillin-tazobactam (ZOSYN) IVPB 3.375 g  Status:  Discontinued        3.375 g 12.5 mL/hr over 240 Minutes Intravenous Every 8 hours 11/17/22 1753 11/24/22 0949   11/17/22 1845  piperacillin-tazobactam (ZOSYN) IVPB 3.375 g        3.375 g 100 mL/hr over 30 Minutes Intravenous  Once 11/17/22 1753 11/17/22 2012       Subjective: Seen and examined at bedside he was resting and about to go for a walk once he is fully woken up.  Awaiting CT scan.  Denies any abdominal discomfort or pain.  No nausea or vomiting.  No other concerns or close this time and diarrhea has improved.  Objective: Vitals:   12/05/22 2045 12/06/22 0443 12/06/22 0751 12/06/22 1510  BP: 134/76 134/70 137/76 (!) 153/83  Pulse: 84 79 84 83  Resp: 18 16    Temp: 97.8 F (36.6 C) 97.9 F (36.6 C)  (!) 97.5 F (36.4 C)  TempSrc: Oral Oral  Oral  SpO2: 98% 96% 96% 99%  Weight:      Height:       No intake or output data in the 24 hours ending 12/06/22 1643 Filed Weights   12/03/22 0429 12/03/22 0807 12/05/22 0755  Weight: 73.6 kg 73.6 kg 70 kg   Examination: Physical Exam:  Constitutional: WN/WD elderly Caucasian male in no acute distress appears calm resting Respiratory: Diminished to auscultation bilaterally with some coarse breath sounds, no wheezing, rales, rhonchi or crackles. Normal respiratory effort and patient is not tachypenic. No accessory muscle use.  Unlabored breathing Cardiovascular: RRR, no murmurs / rubs / gallops. S1 and S2 auscultated. No extremity edema.  Abdomen: Soft, not very tender, non-distended. Bowel sounds positive.  GU:  Deferred. Musculoskeletal: No clubbing / cyanosis of digits/nails. No joint deformity upper and lower extremities.  Skin: No rashes, lesions, ulcers. No induration; Warm and dry.  Neurologic: CN 2-12 grossly intact with no focal deficits.  Romberg sign and cerebellar reflexes not assessed.  He is a little sleepy but easily arousable  Psychiatric: Normal judgment and insight. Alert and oriented x 3. Normal mood and appropriate affect.   Data Reviewed: I have personally reviewed following labs and imaging studies  CBC: Recent Labs  Lab 12/01/22 0529 12/02/22 0248 12/03/22 0238 12/04/22 0344 12/05/22 0307 12/06/22 0450  WBC 11.9* 11.4* 11.1* 12.6* 11.1* 10.2  NEUTROABS 7.1  --  6.9 7.7 6.2 5.5  HGB 12.0* 11.9* 12.0* 11.3* 12.2* 12.4*  HCT 36.2* 36.7* 37.2* 34.8* 37.3* 37.7*  MCV 86.4 88.9 87.3 88.1 87.4 86.5  PLT 358 385 422* 373 410* 400   Basic Metabolic Panel: Recent Labs  Lab 12/01/22 1710 12/02/22 0248 12/03/22 0238 12/03/22 1222 12/04/22 0344 12/05/22 0307 12/06/22 0450  NA  --    < > 135 132* 134* 133* 134*  K  --    < > 5.3* 3.8 3.8 3.6 4.0  CL  --    < > 100 99 100 99 97*  CO2  --    < > 27 25 25 24 24   GLUCOSE  --    < > 157* 196* 230* 226* 234*  BUN  --    < > 15 14 15 14 14   CREATININE  --    < > 1.07 1.08 0.94 0.92 0.84  CALCIUM  --    < > 8.9  8.1* 8.1* 8.4* 8.3*  MG 2.0  --  2.2  --  1.9 1.9 1.9  PHOS 2.6  --  2.7  --  3.0 2.7 2.8   < > = values in this interval not displayed.   GFR: Estimated Creatinine Clearance: 64.5 mL/min (by C-G formula based on SCr of 0.84 mg/dL). Liver Function Tests: Recent Labs  Lab 12/02/22 0845 12/03/22 0238 12/04/22 0344 12/05/22 0307 12/06/22 0450  AST 31 31 46* 42* 33  ALT 25 30 41 42 39  ALKPHOS 59 64 54 61 66  BILITOT 0.8 0.7 0.6 0.7 0.5  PROT 6.6 7.2 6.3* 7.1 6.7  ALBUMIN 2.4* 2.6* 2.4* 2.6* 2.5*   No results for input(s): "LIPASE", "AMYLASE" in the last 168 hours. No results for input(s): "AMMONIA" in the  last 168 hours. Coagulation Profile: Recent Labs  Lab 12/02/22 0248  INR 1.1   Cardiac Enzymes: No results for input(s): "CKTOTAL", "CKMB", "CKMBINDEX", "TROPONINI" in the last 168 hours. BNP (last 3 results) No results for input(s): "PROBNP" in the last 8760 hours. HbA1C: No results for input(s): "HGBA1C" in the last 72 hours. CBG: Recent Labs  Lab 12/05/22 2048 12/06/22 0012 12/06/22 0445 12/06/22 0752 12/06/22 1121  GLUCAP 226* 207* 209* 208* 189*   Lipid Profile: No results for input(s): "CHOL", "HDL", "LDLCALC", "TRIG", "CHOLHDL", "LDLDIRECT" in the last 72 hours. Thyroid Function Tests: No results for input(s): "TSH", "T4TOTAL", "FREET4", "T3FREE", "THYROIDAB" in the last 72 hours. Anemia Panel: No results for input(s): "VITAMINB12", "FOLATE", "FERRITIN", "TIBC", "IRON", "RETICCTPCT" in the last 72 hours. Sepsis Labs: No results for input(s): "PROCALCITON", "LATICACIDVEN" in the last 168 hours.  Recent Results (from the past 240 hour(s))  C Difficile Quick Screen (NO PCR Reflex)     Status: None   Collection Time: 12/05/22  5:15 PM   Specimen: STOOL  Result Value Ref Range Status   C Diff antigen NEGATIVE NEGATIVE Final   C Diff toxin NEGATIVE NEGATIVE Final   C Diff interpretation No C. difficile detected.  Final    Comment: Performed at [facility], [address], [place], [place] [address]    Radiology Studies: No results found.  Scheduled Meds:  feeding supplement  1 Container Oral TID BM   feeding supplement (PROSource TF20)  60 mL Per Tube Daily   free water  150 mL Per Tube Q4H   guaiFENesin  600 mg Oral BID   insulin aspart  0-9 Units Subcutaneous Q4H   pantoprazole (PROTONIX) IV  40 mg Intravenous Q24H   sodium chloride  1 drop Both Eyes QHS   Continuous Infusions:  feeding supplement (OSMOLITE 1.5 CAL) 1,000 mL (12/04/22 2140)   methocarbamol (ROBAXIN) IV     piperacillin-tazobactam (ZOSYN)  IV 3.375 g (12/06/22 0755)    LOS: 19 days    Marguerita Merles, DO [facility] Available via Epic secure chat 7am-7pm After these hours, please refer to coverage provider listed on [email or web address] 12/06/2022, 4:43 PM

## 2022-12-06 NOTE — Progress Notes (Signed)
[facility] Progress Note  3 Days Post-Op  Subjective: CC:  NAEO. Pain overall improving. Still having some radiating pain to his R shoulder. Tolerating some soft diet along with cortrak TF but reports decreased appetite and early satiety. He is walking daily and his BMs have slowed down, last BM was yesterday.  Objective: Vital signs in last 24 hours: Temp:  [97.8 F (36.6 C)-97.9 F (36.6 C)] 97.9 F (36.6 C) (06/03 0443) Pulse Rate:  [75-84] 84 (06/03 0751) Resp:  [16-18] 16 (06/03 0443) BP: (134-137)/(70-76) 137/76 (06/03 0751) SpO2:  [96 %-98 %] 96 % (06/03 0751) Last BM Date : 12/05/22  Intake/Output from previous day: 06/02 0701 - 06/03 0700 In: 360 [P.O.:360] Out: -  Intake/Output this shift: No intake/output data recorded.  PE: Gen:  Alert, NAD, pleasant Pulm:  Normal effort Abd: Soft, non distended, mild TTP around incisons and epigastric region, no rebound, no hernias. Skin: warm and dry, no rashes  Psych: A&Ox3   Lab Results:  Recent Labs    12/05/22 0307 12/06/22 0450  WBC 11.1* 10.2  HGB 12.2* 12.4*  HCT 37.3* 37.7*  PLT 410* 400   BMET Recent Labs    12/05/22 0307 12/06/22 0450  NA 133* 134*  K 3.6 4.0  CL 99 97*  CO2 24 24  GLUCOSE 226* 234*  BUN 14 14  CREATININE 0.92 0.84  CALCIUM 8.4* 8.3*   PT/INR No results for input(s): "LABPROT", "INR" in the last 72 hours. CMP     Component Value Date/Time   NA 134 (L) 12/06/2022 0450   NA 138 05/12/2013 0759   K 4.0 12/06/2022 0450   K 3.7 05/12/2013 0759   CL 97 (L) 12/06/2022 0450   CL 105 05/12/2013 0759   CO2 24 12/06/2022 0450   CO2 25 05/12/2013 0759   GLUCOSE 234 (H) 12/06/2022 0450   GLUCOSE 199 (H) 05/12/2013 0759   BUN 14 12/06/2022 0450   BUN 18 05/12/2013 0759   CREATININE 0.84 12/06/2022 0450   CREATININE 1.08 05/12/2013 0759   CALCIUM 8.3 (L) 12/06/2022 0450   CALCIUM 8.8 05/12/2013 0759   PROT 6.7 12/06/2022 0450   PROT 8.0 05/12/2013 0759   ALBUMIN  2.5 (L) 12/06/2022 0450   ALBUMIN 4.0 05/12/2013 0759   AST 33 12/06/2022 0450   AST 22 05/12/2013 0759   ALT 39 12/06/2022 0450   ALT 32 05/12/2013 0759   ALKPHOS 66 12/06/2022 0450   ALKPHOS 79 05/12/2013 0759   BILITOT 0.5 12/06/2022 0450   BILITOT 1.0 05/12/2013 0759   GFRNONAA >60 12/06/2022 0450   GFRNONAA >60 05/12/2013 0759   GFRAA >60 12/07/2018 2227   GFRAA >60 05/12/2013 0759   Lipase     Component Value Date/Time   LIPASE 32 11/21/2022 0237       Studies/Results: No results found.  Anti-infectives: Anti-infectives (From admission, onward)    Start     Dose/Rate Route Frequency Ordered Stop   11/25/22 1400  piperacillin-tazobactam (ZOSYN) IVPB 3.375 g        3.375 g 12.5 mL/hr over 240 Minutes Intravenous Every 8 hours 11/25/22 1136     11/18/22 0600  piperacillin-tazobactam (ZOSYN) IVPB 3.375 g  Status:  Discontinued        3.375 g 12.5 mL/hr over 240 Minutes Intravenous Every 8 hours 11/17/22 1753 11/24/22 0949   11/17/22 1845  piperacillin-tazobactam (ZOSYN) IVPB 3.375 g        3.375 g 100 mL/hr over 30 Minutes  Intravenous  Once 11/17/22 1753 11/17/22 2012        Assessment/Plan Hemorrhagic/necrotizing biliary pancreatitis Choledocholithiasis with elevated LFT's - MRCP 5/21 with choledocholithiasis and concern for possible developing pseudocyst  - Status post successful ERCP with sphincterotomy 5/30, Dr. Meridee Score - Lap chole 5/31 - Dr. Fredricka Bonine  - GI following for diet advancement and ordered repeat CT scan today, we will follow theses results. No emergent indication for surgery today. CCS will sign off. Outpatient follow up s/p cholecystectomy arranged.     LOS: 19 days   I reviewed nursing notes, Consultant GI notes, hospitalist notes, last 24 h vitals and pain scores, last 48 h intake and output, last 24 h labs and trends, and last 24 h imaging results.   Hosie Spangle, PA-C [facility] Please see Amion for pager number  during day hours 7:00am-4:30pm

## 2022-12-06 NOTE — Plan of Care (Signed)
  Problem: Education: Goal: Knowledge of General Education information will improve Description: Including pain rating scale, medication(s)/side effects and non-pharmacologic comfort measures Outcome: Progressing   Problem: Health Behavior/Discharge Planning: Goal: Ability to manage health-related needs will improve Outcome: Progressing   Problem: Clinical Measurements: Goal: Ability to maintain clinical measurements within normal limits will improve Outcome: Progressing Goal: Will remain free from infection Outcome: Progressing Goal: Diagnostic test results will improve Outcome: Progressing Goal: Respiratory complications will improve Outcome: Progressing Goal: Cardiovascular complication will be avoided Outcome: Progressing   Problem: Activity: Goal: Risk for activity intolerance will decrease Outcome: Progressing   Problem: Nutrition: Goal: Adequate nutrition will be maintained Outcome: Progressing   Problem: Coping: Goal: Level of anxiety will decrease Outcome: Progressing   Problem: Elimination: Goal: Will not experience complications related to bowel motility Outcome: Progressing Goal: Will not experience complications related to urinary retention Outcome: Progressing   Problem: Pain Managment: Goal: General experience of comfort will improve Outcome: Progressing   Problem: Safety: Goal: Ability to remain free from injury will improve Outcome: Progressing   Problem: Skin Integrity: Goal: Risk for impaired skin integrity will decrease Outcome: Progressing   Problem: Education: Goal: Knowledge of Pancreatitis treatment and prevention will improve Outcome: Progressing   Problem: Health Behavior/Discharge Planning: Goal: Ability to formulate a plan to maintain an alcohol-free life will improve Outcome: Progressing   Problem: Nutritional: Goal: Ability to achieve adequate nutritional intake will improve Outcome: Progressing   Problem: Clinical  Measurements: Goal: Complications related to the disease process, condition or treatment will be avoided or minimized Outcome: Progressing   Problem: Education: Goal: Ability to describe self-care measures that may prevent or decrease complications (Diabetes Survival Skills Education) will improve Outcome: Progressing Goal: Individualized Educational Video(s) Outcome: Progressing   Problem: Coping: Goal: Ability to adjust to condition or change in health will improve Outcome: Progressing   Problem: Fluid Volume: Goal: Ability to maintain a balanced intake and output will improve Outcome: Progressing   Problem: Health Behavior/Discharge Planning: Goal: Ability to identify and utilize available resources and services will improve Outcome: Progressing Goal: Ability to manage health-related needs will improve Outcome: Progressing   Problem: Metabolic: Goal: Ability to maintain appropriate glucose levels will improve Outcome: Progressing   Problem: Nutritional: Goal: Maintenance of adequate nutrition will improve Outcome: Progressing Goal: Progress toward achieving an optimal weight will improve Outcome: Progressing   Problem: Skin Integrity: Goal: Risk for impaired skin integrity will decrease Outcome: Progressing   Problem: Tissue Perfusion: Goal: Adequacy of tissue perfusion will improve Outcome: Progressing

## 2022-12-06 NOTE — Progress Notes (Signed)
Physical Therapy Treatment Patient Details Name: Jon Bray MRN: [id number] DOB: 1941-01-20 Today's Date: 12/06/2022   History of Present Illness Mr. Garino is a 82 y.o. male hospitalized with necrotizing/hemorrhagic pancreatitis and previously failed ERCP and now status post biliary sphincterotomy and stone extraction and now status post cholecystectomy. PMH significant for CAD, hypertension, atrial fibrillation, hyperlipidemia, chronic renal insufficiency, nephrolithiasis, cholelithiasis, gallstone pancreatitis with retained biliary stone.    PT Comments    Pt presented seated in recliner with family present. Pt agreeable and tolerated today's session well. Demonstrated understanding and fair ability of power up with sit to stand transfers. Able to ambulate >300 ft today with supervision; intermittent verbal cues for upright posture. Ambulation required one seated rest break due to fatigue; vital stable (HR 106, O2 97%), discussed self manual techniques to address shoulder soreness. End of session with pt reclined in recliner with family present. Pt will continue to benefit from skilled physical therapy to improve functional mobility.    Recommendations for follow up therapy are one component of a multi-disciplinary discharge planning process, led by the attending physician.  Recommendations may be updated based on patient status, additional functional criteria and insurance authorization.  Follow Up Recommendations       Assistance Recommended at Discharge Intermittent Supervision/Assistance  Patient can return home with the following A little help with walking and/or transfers;A little help with bathing/dressing/bathroom;Assistance with cooking/housework;Assist for transportation;Help with stairs or ramp for entrance   Equipment Recommendations  None recommended by PT    Recommendations for Other Services       Precautions / Restrictions Precautions Precautions: Fall Precaution  Comments: Cortrak Restrictions Weight Bearing Restrictions: No     Mobility  Bed Mobility               General bed mobility comments: pt received sitting up in chair    Transfers Overall transfer level: Modified independent Equipment used: None Transfers: Sit to/from Stand Sit to Stand: Supervision           General transfer comment: pt used single UE for power up from recliner and chair    Ambulation/Gait Ambulation/Gait assistance: Min guard Gait Distance (Feet): 350 Feet Assistive device: None Gait Pattern/deviations: Step-through pattern, Decreased stride length, Trunk flexed Gait velocity: Fair Gait velocity interpretation: <1.8 ft/sec, indicate of risk for recurrent falls   General Gait Details: Presented with flexed posture due to pain; intermittent cues for upright posture. Able to maintain but returned to flexed due to pain. Presented with decreased arm swing due to pain. Required min Guard for safety throughout ambulation   Stairs             Wheelchair Mobility    Modified Rankin (Stroke Patients Only)       Balance Overall balance assessment: No apparent balance deficits (not formally assessed)                                          Cognition Arousal/Alertness: Awake/alert Behavior During Therapy: WFL for tasks assessed/performed Overall Cognitive Status: Within Functional Limits for tasks assessed                                          Exercises      General Comments  Pertinent Vitals/Pain Pain Assessment Pain Assessment: 0-10 Pain Score: 4  (No pain without movement, up to a 4 during transfers and gait) Pain Location: abdomen, R shoulder (shoudler soreness) Pain Descriptors / Indicators: Aching, Discomfort, Grimacing, Guarding, Sore Pain Intervention(s): Limited activity within patient's tolerance, Monitored during session    Home Living                           Prior Function            PT Goals (current goals can now be found in the care plan section) Acute Rehab PT Goals Patient Stated Goal: get better and home PT Goal Formulation: With patient/family Time For Goal Achievement: 12/17/22 Potential to Achieve Goals: Good Progress towards PT goals: Progressing toward goals    Frequency    Min 3X/week      PT Plan Current plan remains appropriate    Co-evaluation              AM-PAC PT "6 Clicks" Mobility   Outcome Measure  Help needed turning from your back to your side while in a flat bed without using bedrails?: A Little Help needed moving from lying on your back to sitting on the side of a flat bed without using bedrails?: A Little Help needed moving to and from a bed to a chair (including a wheelchair)?: A Little Help needed standing up from a chair using your arms (e.g., wheelchair or bedside chair)?: A Little Help needed to walk in hospital room?: A Little Help needed climbing 3-5 steps with a railing? : A Little 6 Click Score: 18    End of Session Equipment Utilized During Treatment: Gait belt Activity Tolerance: Patient tolerated treatment well Patient left: in chair;with call bell/phone within reach;with family/visitor present Nurse Communication: Mobility status PT Visit Diagnosis: Muscle weakness (generalized) (M62.81);Difficulty in walking, not elsewhere classified (R26.2);Pain Pain - Right/Left: Right Pain - part of body:  (abdomen)     Time: 1610 -0915 PT Time Calculation (min) (ACUTE ONLY): 19 min  Charges:  $Gait Training: 8-22 mins                     Christene Lye, SPT Acute Rehabilitation Services [phone] Secure chat preferred     Christene Lye 12/06/2022, 10:09 AM

## 2022-12-06 NOTE — Progress Notes (Addendum)
Patient ID: Jon Bray, male   DOB: [birth year], 82 y.o.   MRN: [id number]     Attending physician's note   I have taken a history, reviewed the chart, and examined the patient. I performed a substantive portion of this encounter, including complete performance of at least one of the key components, in conjunction with the APP. I agree with the APP's note, impression, and recommendations with my edits.   Feeling better today.  Trying to increase p.o. intake.  Plan for repeat CT pancreas protocol today.  Inflammatory markers downtrending, CBC stable, normal renal function/electrolytes.  C. difficile negative, fecal elastase pending.  GI service will continue to follow.  Dr. Lavon Paganini will assume his inpatient GI care starting tomorrow morning.  Jatniel Verastegui, DO, FACG [phone] office          Progress Note   Subjective   Day # 19 CC; gallstone pancreatitis, with retained biliary stone, necrotizing/hemorrhagic pancreatitis, prior failed ERCP now status post biliary sphincterotomy and stone extraction, and status post cholecystectomy  Patient is up in the chair, wife at bedside he is eating better, attempting solid food Core tract feedings continue at 55 cc/h Frequent bowel movements after eating Abdominal pain/discomfort about the same  C. difficile quick screen negative [place] elastase pending CRP improved down to 1.8/sed rate improved down to 52 WBC 10.2/hemoglobin 12.4/hematocrit 37.7 Albumin 2.5 LFTs within normal limits  CT abdomen pelvis planned for today     Objective   Vital signs in last 24 hours: Temp:  [97.8 F (36.6 C)-97.9 F (36.6 C)] 97.9 F (36.6 C) (06/03 0443) Pulse Rate:  [75-84] 84 (06/03 0751) Resp:  [16-18] 16 (06/03 0443) BP: (134-137)/(70-76) 137/76 (06/03 0751) SpO2:  [96 %-98 %] 96 % (06/03 0751) Last BM Date : 12/05/22 General:    Elderly white male in NAD, chronically ill-appearing, up in chair, enteral tube in nares Heart:   Regular rate and rhythm; no murmurs Lungs: Respirations even and unlabored, lungs CTA bilaterally Abdomen:  Soft, tender across the upper abdomen ,normal bowel sounds.  No rebound, incisions healing Extremities:  Without edema. Neurologic:  Alert and oriented,  grossly normal neurologically. Psych:  Cooperative. Normal mood and affect.  Intake/Output from previous day: 06/02 0701 - 06/03 0700 In: 360 [P.O.:360] Out: -  Intake/Output this shift: No intake/output data recorded.  Lab Results: Recent Labs    12/04/22 0344 12/05/22 0307 12/06/22 0450  WBC 12.6* 11.1* 10.2  HGB 11.3* 12.2* 12.4*  HCT 34.8* 37.3* 37.7*  PLT 373 410* 400   BMET Recent Labs    12/04/22 0344 12/05/22 0307 12/06/22 0450  NA 134* 133* 134*  K 3.8 3.6 4.0  CL 100 99 97*  CO2 25 24 24   GLUCOSE 230* 226* 234*  BUN 15 14 14   CREATININE 0.94 0.92 0.84  CALCIUM 8.1* 8.4* 8.3*   LFT Recent Labs    12/06/22 0450  PROT 6.7  ALBUMIN 2.5*  AST 33  ALT 39  ALKPHOS 66  BILITOT 0.5   PT/INR No results for input(s): "LABPROT", "INR" in the last 72 hours.      Assessment / Plan:    #30 82 year old white male with history of coronary artery disease, hypertension, atrial fibrillation and chronic renal insufficiency admitted with acute gallstone pancreatitis, with retained CBD stone. Initially had failed ERCP, prolonged hospitalization with necrotizing/hemorrhagic pancreatitis Is now status post biliary sphincterotomy, stone extraction and status post cholecystectomy 4 days ago.    Patient is trying to  increase p.o. intake, hopefully can work towards getting him off of tube feedings this week, adding protein supplements  Inflammatory markers improving C. difficile negative, fecal elastase pending  Plan; await results of CT abdomen pelvis today Plan to transition Protonix to oral tomorrow As above depending on CT results start weaning tube feeds Await fecal elastase GI will continue to  follow      Principal Problem:   Acute gallstone pancreatitis Active Problems:   CAD (coronary artery disease)   GERD (gastroesophageal reflux disease)   Hyperlipidemia, mixed   Type 2 diabetes mellitus with peripheral angiopathy (HCC)   Choledocholithiasis   Necrotizing pancreatitis   Hemorrhagic pancreatitis   History of ERCP   Biliary obstruction   Moderate protein-calorie malnutrition (HCC)   Gallstone pancreatitis   Diarrhea     LOS: 19 days   Amy EsterwoodPA-C  12/06/2022, 11:09 AM

## 2022-12-06 NOTE — Progress Notes (Signed)
Nutrition Follow-up  DOCUMENTATION CODES:  Not applicable  INTERVENTION:  Tube feeds via NG-tube: Osmolite 1.5 at 55 mL/hr (1320 mL per day) 60 mL ProSource TF20 - Daily Free water flush: 150 mL q4h Tube feeds at goal provides 2060 kcal, 103 gm protein, and 1906 mL total free water daily.  Continue Boost Breeze po TID, each supplement provides 250 kcal and 9 grams of protein Magic cup Daily with meals, each supplement provides 290 kcal and 9 grams of protein Diet education  NUTRITION DIAGNOSIS:  Inadequate oral intake related to acute illness as evidenced by per patient/family report. - Ongoing   GOAL:  Patient will meet greater than or equal to 90% of their needs - Being meet via TF  MONITOR:  PO intake, Labs, I & O's  REASON FOR ASSESSMENT:   Consult Enteral/tube feeding initiation and management  ASSESSMENT:   82 y.o. male presented to [facility] ED with abdominal pain, nausea, and vomiting for 3 days. PMH includes GERD, HLD, T2DM, and CAD. Pt admitted with acute gallstone pancreatitis.   5/15 - transferred to [facility]; Admitted 5/18 - diet advanced to full liquids 5/20 - NPO; s/p ERCP (unsuccessful); diet advanced to clear liquids 5/21 - diet advanced to heart healthy 5/27 - NG-tube placed (tip gastric) 5/28 - TF started 5/30 - ERCP; diet advanced to full liquids 5/31 - Op, s/p lap cholecystectomy; diet advanced to clear liquids; Cortrak placed (tip gastric) 6/01 - diet advanced to soft  Met with pt and wife in room. Pt reports tolerating tube feds thus far. Denies any nausea, vomiting, pain or diarrhea with eating. Was able to eat some oatmeal and a pancake with jelly this morning. Reviewed the menu with pt and wife, discussed current diet is fiber free, but needs to follow low fat due to pancreatitis/cholecystectomy. Provided handout for diet recommendation with pancreatitis, recommend low fat and slowly introduce fat as tolerated once symptoms resolve. Reviewed common GI  symptoms associated with pancreatitis. Added "Pancreatitis Label reading Tips" to discharge instructions per wife's request.   Pt reports still not having a huge appetite, RD encouraged pt to consume small frequent meals until appetite improves. Pt does not really care for the Boost Breeze RD provided alternative ways to consume to assist with taste.   Per GI note, plan for CT today to assess current progression. .   Meal Intake 6/2: 25-75% x 2 meals  Medications reviewed and include: NovoLog SSI, Protonix, IV antibiotics  Labs reviewed: Sodium 134, Potassium 4.0, Phosphorus 2.8, Magnesium 1.9 CBG: 134-189 x 24 hours   Diet Order:   Diet Order             DIET SOFT Room service appropriate? Yes; Fluid consistency: Thin  Diet effective now                  EDUCATION NEEDS:   Education needs have been addressed  Skin:  Skin Assessment: Reviewed RN Assessment  Last BM:  6/2-Type 5, small  Height:  Ht Readings from Last 1 Encounters:  12/03/22 5\' 8"  (1.727 m)   Weight:  Wt Readings from Last 1 Encounters:  12/05/22 70 kg   Ideal Body Weight:  70 kg  BMI:  Body mass index is 23.48 kg/m.  Estimated Nutritional Needs:  Kcal:  1900-2100 Protein:  95-115 grams Fluid:  >/= 1.9 L   Kirby Crigler RD, LDN Clinical Dietitian See [facility] for contact information.

## 2022-12-06 NOTE — Inpatient Diabetes Management (Signed)
Inpatient Diabetes Program Recommendations  AACE/ADA: New Consensus Statement on Inpatient Glycemic Control (2015)  Target Ranges:  Prepandial:   less than 140 mg/dL      Peak postprandial:   less than 180 mg/dL (1-2 hours)      Critically ill patients:  140 - 180 mg/dL   Lab Results  Component Value Date   GLUCAP 208 (H) 12/06/2022   HGBA1C 5.8 (H) 11/19/2022    Review of Glycemic Control  Latest Reference Range & Units 12/05/22 07:27 12/05/22 12:16 12/05/22 16:30 12/05/22 20:48 12/06/22 00:12 12/06/22 04:45 12/06/22 07:52  Glucose-Capillary 70 - 99 mg/dL 960 (H) 454 (H) 098 (H) 226 (H) 207 (H) 209 (H) 208 (H)  (H): Data is abnormally high  Diabetes history: DM2 Outpatient Diabetes medications: Diet controlled Current orders for Inpatient glycemic control: Novolog 0-9 units Q4H, Osmolite @ 55 ml/hr  Inpatient Diabetes Program Recommendations:    If tube feeds continue, please consider:  Novolog 2 units Q4H tube feed coverage.  Hold if feeds held or discontinued.   Will continue to follow while inpatient.  Thank you, Dulce Sellar, MSN, CDCES Diabetes Coordinator Inpatient Diabetes Program [phone] (team pager from 8a-5p)

## 2022-12-07 ENCOUNTER — Inpatient Hospital Stay (HOSPITAL_COMMUNITY): Payer: HMO

## 2022-12-07 DIAGNOSIS — I2510 Atherosclerotic heart disease of native coronary artery without angina pectoris: Secondary | ICD-10-CM | POA: Diagnosis not present

## 2022-12-07 DIAGNOSIS — E1151 Type 2 diabetes mellitus with diabetic peripheral angiopathy without gangrene: Secondary | ICD-10-CM

## 2022-12-07 DIAGNOSIS — K8051 Calculus of bile duct without cholangitis or cholecystitis with obstruction: Secondary | ICD-10-CM | POA: Diagnosis not present

## 2022-12-07 DIAGNOSIS — K219 Gastro-esophageal reflux disease without esophagitis: Secondary | ICD-10-CM | POA: Diagnosis not present

## 2022-12-07 DIAGNOSIS — E46 Unspecified protein-calorie malnutrition: Secondary | ICD-10-CM

## 2022-12-07 DIAGNOSIS — K859 Acute pancreatitis without necrosis or infection, unspecified: Secondary | ICD-10-CM | POA: Diagnosis not present

## 2022-12-07 DIAGNOSIS — K8050 Calculus of bile duct without cholangitis or cholecystitis without obstruction: Secondary | ICD-10-CM | POA: Diagnosis not present

## 2022-12-07 DIAGNOSIS — K851 Biliary acute pancreatitis without necrosis or infection: Secondary | ICD-10-CM | POA: Diagnosis not present

## 2022-12-07 DIAGNOSIS — Z9889 Other specified postprocedural states: Secondary | ICD-10-CM | POA: Diagnosis not present

## 2022-12-07 DIAGNOSIS — R197 Diarrhea, unspecified: Secondary | ICD-10-CM

## 2022-12-07 LAB — CBC WITH DIFFERENTIAL/PLATELET
Abs Immature Granulocytes: 0.07 10*3/uL (ref 0.00–0.07)
Basophils Absolute: 0.1 10*3/uL (ref 0.0–0.1)
Basophils Relative: 1 %
Eosinophils Absolute: 0.5 10*3/uL (ref 0.0–0.5)
Eosinophils Relative: 5 %
HCT: 37.6 % — ABNORMAL LOW (ref 39.0–52.0)
Hemoglobin: 12.4 g/dL — ABNORMAL LOW (ref 13.0–17.0)
Immature Granulocytes: 1 %
Lymphocytes Relative: 32 %
Lymphs Abs: 3.6 10*3/uL (ref 0.7–4.0)
MCH: 28.4 pg (ref 26.0–34.0)
MCHC: 33 g/dL (ref 30.0–36.0)
MCV: 86 fL (ref 80.0–100.0)
Monocytes Absolute: 0.8 10*3/uL (ref 0.1–1.0)
Monocytes Relative: 7 %
Neutro Abs: 6.3 10*3/uL (ref 1.7–7.7)
Neutrophils Relative %: 54 %
Platelets: 383 10*3/uL (ref 150–400)
RBC: 4.37 MIL/uL (ref 4.22–5.81)
RDW: 14.2 % (ref 11.5–15.5)
WBC: 11.4 10*3/uL — ABNORMAL HIGH (ref 4.0–10.5)
nRBC: 0 % (ref 0.0–0.2)

## 2022-12-07 LAB — COMPREHENSIVE METABOLIC PANEL
ALT: 36 U/L (ref 0–44)
AST: 31 U/L (ref 15–41)
Albumin: 2.6 g/dL — ABNORMAL LOW (ref 3.5–5.0)
Alkaline Phosphatase: 68 U/L (ref 38–126)
Anion gap: 13 (ref 5–15)
BUN: 17 mg/dL (ref 8–23)
CO2: 25 mmol/L (ref 22–32)
Calcium: 8.9 mg/dL (ref 8.9–10.3)
Chloride: 97 mmol/L — ABNORMAL LOW (ref 98–111)
Creatinine, Ser: 0.92 mg/dL (ref 0.61–1.24)
GFR, Estimated: >60 mL/min (ref >60)
Glucose, Bld: 244 mg/dL — ABNORMAL HIGH (ref 70–99)
Potassium: 4.5 mmol/L (ref 3.5–5.1)
Sodium: 135 mmol/L (ref 135–145)
Total Bilirubin: 0.5 mg/dL (ref 0.3–1.2)
Total Protein: 6.7 g/dL (ref 6.5–8.1)

## 2022-12-07 LAB — GLUCOSE, CAPILLARY
Glucose-Capillary: 163 mg/dL — ABNORMAL HIGH (ref 70–99)
Glucose-Capillary: 184 mg/dL — ABNORMAL HIGH (ref 70–99)
Glucose-Capillary: 191 mg/dL — ABNORMAL HIGH (ref 70–99)
Glucose-Capillary: 200 mg/dL — ABNORMAL HIGH (ref 70–99)
Glucose-Capillary: 214 mg/dL — ABNORMAL HIGH (ref 70–99)
Glucose-Capillary: 221 mg/dL — ABNORMAL HIGH (ref 70–99)
Glucose-Capillary: 226 mg/dL — ABNORMAL HIGH (ref 70–99)

## 2022-12-07 LAB — PHOSPHORUS: Phosphorus: 3.4 mg/dL (ref 2.5–4.6)

## 2022-12-07 LAB — MAGNESIUM: Magnesium: 2 mg/dL (ref 1.7–2.4)

## 2022-12-07 MED ORDER — OSMOLITE 1.5 CAL PO LIQD
1000.0000 mL | ORAL | Status: DC
  Administered 2022-12-07 (×2): 1000 mL
  Filled 2022-12-07: qty 1000

## 2022-12-07 MED ORDER — CHOLESTYRAMINE LIGHT 4 G PO PACK
2.0000 g | PACK | Freq: Two times a day (BID) | ORAL | Status: DC
  Administered 2022-12-07 – 2022-12-10 (×6): 2 g via ORAL
  Filled 2022-12-07 (×7): qty 1

## 2022-12-07 MED ORDER — PANCRELIPASE (LIP-PROT-AMYL) 36000-114000 UNITS PO CPEP
72000.0000 [IU] | ORAL_CAPSULE | Freq: Three times a day (TID) | ORAL | Status: DC
  Administered 2022-12-08 – 2022-12-10 (×7): 72000 [IU] via ORAL
  Filled 2022-12-07 (×7): qty 2

## 2022-12-07 MED ORDER — ENOXAPARIN SODIUM 40 MG/0.4ML IJ SOSY
40.0000 mg | PREFILLED_SYRINGE | INTRAMUSCULAR | Status: DC
  Administered 2022-12-07 – 2022-12-09 (×3): 40 mg via SUBCUTANEOUS
  Filled 2022-12-07 (×3): qty 0.4

## 2022-12-07 MED ORDER — PANTOPRAZOLE SODIUM 40 MG PO TBEC
40.0000 mg | DELAYED_RELEASE_TABLET | Freq: Every day | ORAL | Status: DC
  Administered 2022-12-07 – 2022-12-10 (×4): 40 mg via ORAL
  Filled 2022-12-07 (×4): qty 1

## 2022-12-07 MED ORDER — INSULIN ASPART 100 UNIT/ML IJ SOLN
3.0000 [IU] | INTRAMUSCULAR | Status: DC
  Administered 2022-12-07 – 2022-12-08 (×4): 3 [IU] via SUBCUTANEOUS

## 2022-12-07 NOTE — Progress Notes (Addendum)
Patient ID: Jon Bray, male   DOB: [birth year], 82 y.o.   MRN: [id number]    Progress Note   Subjective   Day # 20 CC; gallstone pancreatitis, history of retained biliary stone, prior failed ERCP now status post biliary sphincterotomy, stone extraction, and status post cholecystectomy.  Necrotizing/hemorrhagic pancreatitis  Labs today-WBC 11.4/hemoglobin 12.4/  BUN 17/creatinine 0.92 LFTs within normal limits  CT abdomen and pelvis with hepatic protocol yesterday-status post interval cholecystectomy, no biliary dilation, is evidence of hypoenhancing parenchymal necrosis involving the ventral pancreatic head and uncinate process, overall fat necrosis and fluid measuring in aggregate 5.1 x 4.0 cm, as well has multiple small fluid collections situated around the pancreatic body and tail largest around the tail measuring 4.6 x 4.4 cm, feeding tube in gastric antrum.  Patient is sitting up in the chair, had just gone for a walk in the hallway with his wife. He says he did have some pancreatitis type pain last night did require 1 dose of pain medication and feels much better today has not had any recurrent pain. He is eating at least three quarters of his food and tolerating boost  Tube Feeds continue at 55 cc/h     Objective   Vital signs in last 24 hours: Temp:  [97.5 F (36.4 C)-97.8 F (36.6 C)] 97.7 F (36.5 C) (06/04 0716) Pulse Rate:  [83-91] 88 (06/04 0716) Resp:  [15-16] 15 (06/04 0716) BP: (123-153)/(74-86) 132/86 (06/04 0716) SpO2:  [96 %-99 %] 97 % (06/04 0716) Weight:  [73.3 kg] 73.3 kg (06/04 0441) Last BM Date : 12/06/22 General:    Elderly white male in NAD feeding tube in nares Heart:  Regular rate and rhythm; no murmurs Lungs: Respirations even and unlabored, lungs CTA bilaterally Abdomen:  Soft, tender across the upper abdomen no rebound and nondistended. Normal bowel sounds. Extremities:  Without edema. Neurologic:  Alert and oriented,  grossly normal  neurologically. Psych:  Cooperative. Normal mood and affect.  Intake/Output from previous day: 06/03 0701 - 06/04 0700 In: 532 [NG/GT:532] Out: -  Intake/Output this shift: No intake/output data recorded.  Lab Results: Recent Labs    12/05/22 0307 12/06/22 0450 12/07/22 0448  WBC 11.1* 10.2 11.4*  HGB 12.2* 12.4* 12.4*  HCT 37.3* 37.7* 37.6*  PLT 410* 400 383   BMET Recent Labs    12/05/22 0307 12/06/22 0450 12/07/22 0448  NA 133* 134* 135  K 3.6 4.0 4.5  CL 99 97* 97*  CO2 24 24 25   GLUCOSE 226* 234* 244*  BUN 14 14 17   CREATININE 0.92 0.84 0.92  CALCIUM 8.4* 8.3* 8.9   LFT Recent Labs    12/07/22 0448  PROT 6.7  ALBUMIN 2.6*  AST 31  ALT 36  ALKPHOS 68  BILITOT 0.5   PT/INR No results for input(s): "LABPROT", "INR" in the last 72 hours.  Studies/Results: DG CHEST PORT 1 VIEW  Result Date: 12/07/2022 CLINICAL DATA:  Shortness of breath. EXAM: PORTABLE CHEST 1 VIEW COMPARISON:  Chest x-ray Nov 26, 2022. FINDINGS: Enteric tube courses below the diaphragm with the tip outside the field of view. No consolidation. No visible pleural effusions or pneumothorax. Cardiomediastinal silhouette is within normal limits and similar. IMPRESSION: No active disease. Electronically Signed   By: Feliberto Harts M.D.   On: 12/07/2022 10:48   CT PANCREAS ABD W/WO  Result Date: 12/06/2022 CLINICAL DATA:  Necrotizing pancreatitis EXAM: CT ABDOMEN WITHOUT AND WITH CONTRAST TECHNIQUE: Multidetector CT imaging of the abdomen was performed  following the standard protocol before and following the bolus administration of intravenous contrast. RADIATION DOSE REDUCTION: This exam was performed according to the departmental dose-optimization program which includes automated exposure control, adjustment of the mA and/or kV according to patient size and/or use of iterative reconstruction technique. CONTRAST:  75mL OMNIPAQUE IOHEXOL 350 MG/ML SOLN COMPARISON:  None Available. FINDINGS: Lower  chest: No acute abnormality. Scarring or atelectasis of the left lung base. Coronary artery calcifications. Hepatobiliary: No focal liver abnormality is seen. Status post interval cholecystectomy. No biliary dilatation. Pancreas: , With evidence of hypoenhancing parenchymal necrosis involving the ventral pancreatic head and uncinate (series 5, image 61, 68). Overlying fat necrosis and fluid measuring in aggregate 5.1 x 4.0 cm (series 5, image 68) as well as multiple small fluid collections situated about the pancreatic body and tail, largest about the tip of the pancreatic tail measuring 4.6 x 4.4 cm (series 5, image 39) and ventral pancreatic body and neck (series 5, image 46). Spleen: Normal in size without significant abnormality. Adrenals/Urinary Tract: Adrenal glands are unremarkable. Kidneys are normal, without renal calculi, solid lesion, or hydronephrosis. Stomach/Bowel: Stomach is within normal limits. Non weighted enteric feeding tube, tip near the gastric antrum (series 5, image 51). No evidence of bowel wall thickening, distention, or inflammatory changes. Vascular/Lymphatic: Aortic atherosclerosis. No enlarged abdominal lymph nodes. Other: No abdominal wall hernia or abnormality. Minimal ascites. Small volume postoperative pneumoperitoneum. Musculoskeletal: No acute or significant osseous findings. IMPRESSION: 1. Similar appearance of the pancreas, specifically with evidence of hypoenhancing parenchymal necrosis involving the ventral pancreatic head and uncinate. Overlying fat necrosis and fluid as well as multiple small fluid collections situated about the pancreatic body and tail and ventral pancreatic body and neck. Findings are consistent with pancreatitis complicated by parenchymal necrosis as well as multiple organizing acute pancreatic fluid collections. Presence or absence of infection is not established by CT. 2. Status post interval cholecystectomy with small volume postoperative  pneumoperitoneum. 3. A common bile duct calculus identified by prior MR is not appreciated on today's examination; presumably the common bile duct was interrogated by ERCP at the time of cholecystectomy. 4. Coronary artery disease. Aortic Atherosclerosis (ICD10-I70.0). Electronically Signed   By: Jearld Lesch M.D.   On: 12/06/2022 16:49       Assessment / Plan:     #62 82 year old white male with prolonged hospital course due to gallstone pancreatitis with severe pancreatitis/necrotizing hemorrhagic.  He had a retained CBD stone now status post biliary sphincterotomy and stone extraction as well as cholecystectomy.  He is continuing to slowly improve, not requiring any regular analgesics, he is doing very well with ambulation  He continues on tube feedings-he is doing much better with eating, eating at least three quarters of his meals and boost has also been added.  Will slowly start weaning the tube feeds the next 48 hours  Remains on Zosyn  CT appears stable, he is forming multiple small pseudocyst and has persistent evidence of some pancreatic necrosis ,no abscess formation   Plan; continue current diet and boost supplements' Decrease tube feeds to 30 cc/h  Continue Zosyn, will discuss duration of antibiotics with team i.e. converting to oral  Will need repeat imaging in about 2 weeks, and office follow-up with Dr. Meridee Score.  Hopefully he will be able to be discharged home by this weekend continued improvement    Principal Problem:   Acute gallstone pancreatitis Active Problems:   CAD (coronary artery disease)   GERD (gastroesophageal reflux disease)  Hyperlipidemia, mixed   Type 2 diabetes mellitus with peripheral angiopathy (HCC)   Choledocholithiasis   Necrotizing pancreatitis   Hemorrhagic pancreatitis   History of ERCP   Biliary obstruction   Moderate protein-calorie malnutrition (HCC)   Gallstone pancreatitis   Diarrhea     LOS: 20 days   Amy Esterwood  PA-C 12/07/2022, 12:16 PM   Attending physician's note   I have taken a history, reviewed the chart and examined the patient. I performed a substantive portion of this encounter, including complete performance of at least one of the key components, in conjunction with the APP. I agree with the APP's note, impression and recommendations.   Gallstone pancreatitis with retained biliary stone s/p biliary sphincterotomy with stone extraction and cholecystectomy Sequelae of necrotizing pancreatitis with cirrhosis Continue Zosyn Continue tube feeds and oral intake as tolerated  Complains of postprandial diarrhea, will add Creon 72,000 3 times daily with meals and Prevalite 2 g twice daily for pancreatic insufficiency and bile salt induced diarrhea  Pain control as needed     The patient was provided an opportunity to ask questions and all were answered. The patient agreed with the plan and demonstrated an understanding of the instructions.   Iona Beard , MD [phone]

## 2022-12-07 NOTE — Progress Notes (Signed)
Physical Therapy Treatment Patient Details Name: Jon Bray MRN: [id number] DOB: 1941-01-05 Today's Date: 12/07/2022   History of Present Illness Jon Bray is a 82 y.o. male hospitalized with necrotizing/hemorrhagic pancreatitis and previously failed ERCP and now status post biliary sphincterotomy and stone extraction and now status post cholecystectomy. PMH significant for CAD, hypertension, atrial fibrillation, hyperlipidemia, chronic renal insufficiency, nephrolithiasis, cholelithiasis, gallstone pancreatitis with retained biliary stone.    PT Comments    Pt greeted up in recliner chair on arrival and agreeable to session with continued progress towards acute goals, however pt continues to be limited by operative site pain/tightness. Pt requiring grossly min guard assist for transfers and gait with without AD support, with pt demonstrating steady but guarded gait. Pt able to ascend/descend 3 steps in stairwell with cues for step-to pattern for safety. Educated pt on importance of continued mobility, appropriate activity progression and benefits of time up OOB with pt and pt spouse verbalizing understanding. Pt continues to benefit from skilled PT services to progress toward functional mobility goals.     Recommendations for follow up therapy are one component of a multi-disciplinary discharge planning process, led by the attending physician.  Recommendations may be updated based on patient status, additional functional criteria and insurance authorization.  Follow Up Recommendations       Assistance Recommended at Discharge Intermittent Supervision/Assistance  Patient can return home with the following A little help with walking and/or transfers;A little help with bathing/dressing/bathroom;Assistance with cooking/housework;Assist for transportation;Help with stairs or ramp for entrance   Equipment Recommendations  None recommended by PT    Recommendations for Other Services        Precautions / Restrictions Precautions Precautions: Fall Precaution Comments: Cortrak Restrictions Weight Bearing Restrictions: No     Mobility  Bed Mobility Overal bed mobility: Needs Assistance             General bed mobility comments: pt received sitting up in chair    Transfers Overall transfer level: Modified independent Equipment used: None Transfers: Sit to/from Stand Sit to Stand: Supervision           General transfer comment: pt used left UE for power up from recliner and chair    Ambulation/Gait Ambulation/Gait assistance: Min guard Gait Distance (Feet): 400 Feet Assistive device: None, IV Pole Gait Pattern/deviations: Step-through pattern, Decreased stride length, Trunk flexed Gait velocity: Fair     General Gait Details: slow guarded gait with flexed posture due to operative site pain and discomfort   Stairs Stairs: Yes Stairs assistance: Min guard Stair Management: One rail Left, Step to pattern, Forwards Number of Stairs: 3 General stair comments: upr down steps in stairwell without fault, cues for step to pattern   Wheelchair Mobility    Modified Rankin (Stroke Patients Only)       Balance Overall balance assessment: No apparent balance deficits (not formally assessed)                                          Cognition Arousal/Alertness: Awake/alert Behavior During Therapy: WFL for tasks assessed/performed Overall Cognitive Status: Within Functional Limits for tasks assessed                                          Exercises  General Comments General comments (skin integrity, edema, etc.): VSS on RA, pt wife present and supportive throughout      Pertinent Vitals/Pain Pain Assessment Pain Assessment: Faces Faces Pain Scale: Hurts little more Pain Location: abdomen Pain Descriptors / Indicators: Aching, Discomfort, Grimacing, Guarding, Sore, Operative site guarding Pain  Intervention(s): Monitored during session, Limited activity within patient's tolerance    Home Living                          Prior Function            PT Goals (current goals can now be found in the care plan section) Acute Rehab PT Goals Patient Stated Goal: to play golf PT Goal Formulation: With patient/family Time For Goal Achievement: 12/17/22 Progress towards PT goals: Progressing toward goals    Frequency    Min 3X/week      PT Plan Current plan remains appropriate    Co-evaluation              AM-PAC PT "6 Clicks" Mobility   Outcome Measure  Help needed turning from your back to your side while in a flat bed without using bedrails?: A Little Help needed moving from lying on your back to sitting on the side of a flat bed without using bedrails?: A Little Help needed moving to and from a bed to a chair (including a wheelchair)?: A Little Help needed standing up from a chair using your arms (e.g., wheelchair or bedside chair)?: A Little Help needed to walk in hospital room?: A Little Help needed climbing 3-5 steps with a railing? : A Little 6 Click Score: 18    End of Session Equipment Utilized During Treatment: Gait belt Activity Tolerance: Patient tolerated treatment well Patient left: in chair;with call bell/phone within reach;with family/visitor present Nurse Communication: Mobility status PT Visit Diagnosis: Muscle weakness (generalized) (M62.81);Difficulty in walking, not elsewhere classified (R26.2);Pain Pain - Right/Left: Right Pain - part of body:  (abdomen)     Time: 4401-0272 PT Time Calculation (min) (ACUTE ONLY): 19 min  Charges:  $Gait Training: 8-22 mins                     Dayra Rapley R. PTA Acute Rehabilitation Services Office: [phone]   Catalina Antigua 12/07/2022, 10:15 AM

## 2022-12-07 NOTE — Progress Notes (Signed)
PROGRESS NOTE    Jon Bray  ZOX:[id number] DOB: [birth year] DOA: 11/17/2022 PCP: Danella Penton, MD   Brief Narrative:  Jon Bray is a 82 y.o. male with PMH significant for DM2, HTN, HLD, CAD/NSTEMI/stent 2014, paroxysmal A-fib 5/14, patient was brought to the ED at [facility] with complaint of worsening nausea, vomiting.   In March, patient saw his primary care provider for loss of appetite, weight loss. Workup showed elevated liver enzymes and bilirubin.  3/27 CT abdomen and pelvis showed focal wall thickening in the urinary bladder adjacent to right UVJ suspicious for neoplasm, also extensive sigmoid diverticulosis 3/28, MRI/MRCP showed choledocholithiasis with 2 tiny gallstones near the ampulla measuring 0.2 to 0.3 cm and mild intra and extrahepatic biliary duct dilatation, CBD dilated to 0.9 cm Patient refused to go to ED for urgent evaluation.  PCP sent referral to general surgery and GI 4/9, seen by surgeon Dr. Trisha Mangle.  Cholecystectomy was recommended but patient wanted to wait it out.   5/14, patient was brought to the ED for 3 days of nausea, vomiting. Initial workup showed lipase elevated over 10,000, LFTs elevated as well MRI abdomen showed acute interstitial pancreatitis, choledocholithiasis with 4 mm stone in distal CBD. He was started on conservative management with IV fluid, IV analgesia, IV antiemetics, bowel rest Admitted to [facility] GI and general surgery were consulted. 5/20, ERCP was attempted but had to be aborted due to inability to identify the papula.  Follow-up MRCP showed persistent small stone in the CBD, changes of pancreatitis along with small fluid collection. Overall, liver enzymes continue to improve, Underwent ERCP and Stone Extractionby Dr. Meridee Score (12/02/22).  Tube feedings were initiated on 11/30/2022 but NG has been removed and Cortrak to be replaced.   **Patient now underwent laparoscopic cholecystectomy on 12/03/22 given that he felt he was stable for  surgery and because it was clinically felt that his pancreatitis seems to be improving.  Gastroenterology recommends continuing and placing a cortrak to allow time for his pancreatitis and cholecystectomy that he also this was placed and Tube feedings were resumed.  He remains on IV Zosyn.  Gastroenterology is repeating a CT pancreas protocol and results as below. TF to be slowly weaned per GI  Assessment and Plan:   Acute Gallstone Pancreatitis with hemorrhage/necrosis/pseudocyst and mass effect on the distal common bile duct/ Choledocholithiasis / Persistent leukocytosis, trending down slowly Status post laparoscopic cholecystectomy POD4 Timeline of events as above. -GI and general surgery following -Pancreatitis seems to have improved.  Currently was  to tolerate regular consistency diet and LFTs normalized and bilirubin fairly stable but  Underwent MRCP 11/29/2022 which unfortunately shows worsening pancreatitis with small area of pancreatic necrosis at the head with possibly evolving hemorrhage and enlarging pseudocyst.   -It noted Pancreatic inflammation exerts mass effect on distal CBD, there continues to be 3 mm filling defect in distal CBD suspicious for choledocholithiasis.   -WBC Trend: Recent Labs  Lab 12/01/22 0529 12/02/22 0248 12/03/22 0238 12/04/22 0344 12/05/22 0307 12/06/22 0450 12/07/22 0448  WBC 11.9* 11.4* 11.1* 12.6* 11.1* 10.2 11.4*  -Underwent ERCP by Dr. Meridee Score and it showed " J-shaped gastric deformity. Gastritis in antrum. No other gross lesions in the entire stomach. Biopsied for H. pylori. No gross lesions in the duodenal bulb, in the first portion of the duodenum and in the second portion of the duodenum. The major papilla appeared normal (though hidden under multiple folds).The fluoroscopic examination was suspicious for sludge. Choledocholithiasis was found. Complete removal  was accomplished by biliary sphincterotomy and balloon sweeping. The very distal  biliary duct had a slight narrowing compared to the majority of the bile duct, though this is not clearly a stricture and the balloon could not pass through this area with ease after stone extraction."   -Patient has been started on NG tube feedings since 11/30/2022 with Osmolite 1.5 Cal Liquid at 55 mL/hr with Prosource TF20 60 mL/Day and Free Water Flushes 150 mL q4h -Remains on antibiotics per GI and general surgery recommendations with IV Zosyn -GI recommends continue to monitor supportive patient over the course of the coming days and hopefully he is no evidence of post ERCP pancreatitis or other complications and recommends in the coming days and weeks to consider cholecystectomy pending his overall clinical course from his necrotizing/hemorrhagic pancreatitis; it was felt patient was clinically improved to the point where he can now undergo surgical intervention and General surgery took him for a laparoscopic cholecystectomy 12/03/22; Now Surgery feels he is stable from their standpoint and has signed off the Case and have scheduled outpatient follow-up status post cholecystectomy -Diet advancement per GI and is on a full liquid diet and has Cortrak getting Tube Feedings; from a surgical standpoint and they feel that he is okay to advance diet as tolerated but gastroenterology recommends continuing tube feedings and recommending advancing diet from full liquid diet to heart healthy diet and are hoping that we can advance his diet by Sunday to allow to feedings to be titrated down; patient was able to tolerate oatmeal today and is doing better -Further management and care per GI and general surgery and anticipating discharging once he has been deemed stable and able to tolerate a diet -Pancreas protocol CT abdomen was repeated on 12/06/2022 to see where things stand in regards to the evolution of his pancreatitis and showed "Similar appearance of the pancreas, specifically with evidence of hypoenhancing  parenchymal necrosis involving the ventral pancreatic head and uncinate. Overlying fat necrosis and fluid as well as multiple small fluid collections situated about the pancreatic body and tail and ventral pancreatic body and neck. Findings are consistent with pancreatitis complicated by parenchymal necrosis as well as multiple organizing acute pancreatic fluid collections. Presence or absence of infection is not established by CT. Status post interval cholecystectomy with small volume postoperative pneumoperitoneum. A common bile duct calculus identified by prior MR is not appreciated on today's examination; presumably the common bile duct was interrogated by ERCP at the time of cholecystectomy Coronary artery disease. Aortic Atherosclerosis." -GI also increasing bowel regimen to try and prevent risk of developing ileus however he is having much more bowel movements and likely related to tube feedings and his bowel regimen but GI wants to rule out C. difficile and ordering a fecal elastase as well and have adjusted his bowel regimen; C. difficile been negative and fecal elastase is pending  -GI recommending recommending repeating ESR and CRP to follow-up on inflammation better and ESR was 52 and CRP was 1.8 -The GI team is recommending to slowly wean to feedings in next 48 hours based on his CT scan findings and decreasing to 30 cc/h as well as continue IV Zosyn and converting to oral once he is taking more p.o. and repeat imaging in about 2 weeks.  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   Bibasilar Atelectasis -Persistent bibasilar atelectasis  noted in chest x-ray done on 5/18 and repeated 5/24.   -But he is asymptomatic and not hypoxic.   -Continue Incentive spirometry and will order Flutter Valve -Repeat chest x-ray this AM showed "Enteric tube courses below the diaphragm  with the tip outside the field of view. No consolidation. No visible pleural  effusions or pneumothorax. Cardiomediastinal silhouette is within normal limits and similar."   CAD/NSTEMI/stent 2014 HLD -No anginal symptoms currently. -PTA on aspirin 81 mg daily.  Currently on hold given but will now resume once okayed by GI and General Surgery given recent procedures  -Not on statin as an outpatient   Paroxysmal A-fib  -Was on aspirin daily which is currently on hold.  -Not on anticoagulation as an outpatient -Remains on Med-Surge but  if necessary will transfer to Telemetry    Type 2 Diabetes mellitus -Diet controlled.  Not on meds, A1c 5.8 on 11/19/2022 -CBG Trend: Recent Labs  Lab 12/06/22 1656 12/06/22 2117 12/07/22 0009 12/07/22 0438 12/07/22 0808 12/07/22 1223 12/07/22 1646  GLUCAP 255* 200* 191* 214* 200* 226* 221*  -Now patient is on sensitive NovoLog sliding scale insulin every 4 and NovoLog 3 units every 4 scheduled given his tube feedings but will need to hold the NovoLog every 4 scheduled the feeds are held or discontinued  GERD/GI Prophylaxis  -Continue PPI with pantoprazole 40 mg IV every 24h and GI is hopeful to transition to orals within next 24 hours if he is doing well   Bladder Wall Thickening On 3/27, CT abdomen and pelvis showed focal wall thickening in the bladder adjacent to the right UVJ suspicious for neoplasm.  Radiologist recommended cystoscopy for further evaluation. -Urinalysis 11/16/2022 did not show any evidence of hematuria. -Patient/family have not noticed any hematuria thereafter either. -5/22 Dr. Jacqulyn Bath sent an epic message to on-call urology Dr. McDiarmid, recommended outpatient follow-up for further workup including cystoscopy.    Extensive Sigmoid Diverticulosis  Chronic Constipation -Continue Bowel regimen with MiraLAX 17 g p.o. daily as needed and senna docusate 1 tab p.o. nightly but GI has increased this and added 10 mg of bisacodyl  rectal suppositories for moderate constipation and scheduled MiraLAX 17 g daily and have now adjusted his bowel regimen given the patient's diarrhea and have discontinued his scheduled MiraLAX   Rash on the back, improved -Possible associated with newly linen material. -Hydrocortisone Cream 1% Topically 4 times daily now stopped -No complaints at this time   Hyponatremia -Na+ Trend: Recent Labs  Lab 12/02/22 0845 12/03/22 0238 12/03/22 1222 12/04/22 0344 12/05/22 0307 12/06/22 0450 12/07/22 0448  NA 134* 135 132* 134* 133* 134* 135  -Continue to Monitor and Trend -Repeat CMP in the AM   Normocytic Anemia, stable -Hgb/Hct Trend: Recent Labs  Lab 12/01/22 0529 12/02/22 0248 12/03/22 0238 12/04/22 0344 12/05/22 0307 12/06/22 0450 12/07/22 0448  HGB 12.0* 11.9* 12.0* 11.3* 12.2* 12.4* 12.4*  HCT 36.2* 36.7* 37.2* 34.8* 37.3* 37.7* 37.6*  MCV 86.4 88.9 87.3 88.1 87.4 86.5 86.0  -Checked Anemia Panel and showed an iron level of 48, UIBC of 235, TIBC of 283, saturation ratios of 17%, ferritin level of 474, folate of 15.8 and a vitamin B12 of 848 -Continue to Monitor for S/Sx of Bleeding; No overt bleeding noted and now VTE Prophylaxis has been resumed -Repeat CBC in the AM    Hemolyzed Blood Samples -K+ was elevated this AM and Trend showed: Recent Labs  Lab 12/02/22 0845 12/03/22 0238 12/03/22 1222 12/04/22 0344 12/05/22 0307 12/06/22  0450 12/07/22 0448  K 4.2 5.3* 3.8 3.8 3.6 4.0 4.5  -Likely Hemolysis so will repeat CMP this AM   Hypoalbuminemia -Patient's Albumin Trend: Recent Labs  Lab 12/02/22 0248 12/02/22 0845 12/03/22 0238 12/04/22 0344 12/05/22 0307 12/06/22 0450 12/07/22 0448  ALBUMIN 2.4* 2.4* 2.6* 2.4* 2.6* 2.5* 2.6*  -Continue to Monitor and Trend and repeat CMP in the AM   DVT prophylaxis: enoxaparin (LOVENOX) injection 40 mg Start: 12/07/22 1730 SCDs Start: 11/17/22 1732    Code Status: Full Code Family Communication: Discussed with  wife at bedside   Disposition Plan:  Level of care: Med-Surg Status is: Inpatient Remains inpatient appropriate because: Needs further clinical Improvement and clearance by GI   Consultants:  Gastroenterology General Surgery  Procedures:  As delineated as above   ERCP Findings:      The scout film was normal.      The upper GI tract was traversed under direct vision without detailed       examination. 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 Localized       moderate inflammation characterized by adherent blood, congestion       (edema), erythema and friability was found in the gastric antrum. No       other gross lesions were noted in the entire examined stomach (biopsied       for H. pylori). No gross lesions were noted in the duodenal bulb, in the       first portion of the duodenum and in the second portion of the duodenum.       The major papilla was normal in appearance and hidden under multiple       folds.      A short 0.035 inch Soft Jagwire was passed into the biliary tree after       engaging the ampulla with the sphincterotome. The Hydratome       sphincterotome was then passed over the guidewire and the bile duct was       then deeply cannulated. Contrast was injected. I personally interpreted       the bile duct images. Ductal flow of contrast was adequate. Image       quality was adequate. Contrast extended to the entire biliary tree.       Opacification of the entire biliary tree was successful. The maximum       diameter of the ducts was approximately 7 mm. The middle third of the       main bile duct contained filling defect thought to be sludge versus       stone. The very distal bile duct was slightly narrowed compared to the       rest of the biliary tree for approximately 15 mm in length (not clearly       a stenosis). A 7 mm biliary sphincterotomy was made with a monofilament       Hydratome sphincterotome using ERBE electrocautery. There was no        post-sphincterotomy bleeding. To discover objects, the biliary tree was       swept with a retrieval balloon. Sludge was swept from the duct. One       stone was removed. No stones remained. An occlusion cholangiogram was       performed that showed no further significant biliary pathology and the       gallbladder began to fill.      A pancreatogram was not performed.  The duodenoscope was withdrawn from the patient. Impression:               - J-shaped gastric deformity.                           - Gastritis in antrum. No other gross lesions in                            the entire stomach. Biopsied for H. pylori.                           - No gross lesions in the duodenal bulb, in the                            first portion of the duodenum and in the second                            portion of the duodenum.                           - The major papilla appeared normal (though hidden                            under multiple folds).                           - The fluoroscopic examination was suspicious for                            sludge.                           - Choledocholithiasis was found. Complete removal                            was accomplished by biliary sphincterotomy and                            balloon sweeping.                           - The very distal biliary duct had a slight                            narrowing compared to the majority of the bile                            duct, though this is not clearly a stricture and                            the balloon could not pass through this area with                            ease after stone extraction.. Recommendation:           -  The patient will be observed post-procedure,                            until all discharge criteria are met.                           - Return patient to hospital ward for ongoing care.                           - Have placed orders for Cortrack placement                             urgently in an effort of continuing tube feeds.                            This is why did not replace the NG tube today.                           - Observe patient's clinical course.                           - Watch for pancreatitis, bleeding, perforation,                            and cholangitis.                           - Check liver enzymes (AST, ALT, alkaline                            phosphatase, bilirubin) in the morning.                           - We will continue to monitor and support the                            patient over the course of the coming days.                            Hopefully no evidence of post ERCP pancreatitis or                            other complications will occur. At some point in                            the coming days/weeks we will consider the timing                            of cholecystectomy pending his overall clinical                            course from his necrotizing/hemorrhagic  pancreatitis.                           - The findings and recommendations were discussed                            with the patient.                           - The findings and recommendations were discussed                            with the patient's family.                           - The findings and recommendations were discussed                            with the referring physician.   Laparoscopic Cholecystectomy by Dr. Phylliss Blakes  Antimicrobials:  Anti-infectives (From admission, onward)    Start     Dose/Rate Route Frequency Ordered Stop   11/25/22 1400  piperacillin-tazobactam (ZOSYN) IVPB 3.375 g        3.375 g 12.5 mL/hr over 240 Minutes Intravenous Every 8 hours 11/25/22 1136     11/18/22 0600  piperacillin-tazobactam (ZOSYN) IVPB 3.375 g  Status:  Discontinued        3.375 g 12.5 mL/hr over 240 Minutes Intravenous Every 8 hours 11/17/22 1753 11/24/22 0949   11/17/22 1845   piperacillin-tazobactam (ZOSYN) IVPB 3.375 g        3.375 g 100 mL/hr over 30 Minutes Intravenous  Once 11/17/22 1753 11/17/22 2012       Subjective: Seen and examined at bedside he is resting and having some abdominal discomfort in his incisions.  Feels okay.  No nausea vomiting but then told the GI team that he is continue to have diarrhea when he told me that his diarrhea had improved.  No other concerns or complaints at this time.  Objective: Vitals:   12/06/22 2001 12/07/22 0441 12/07/22 0716 12/07/22 1651  BP: 123/82 131/74 132/86 136/84  Pulse: 91 85 88 85  Resp: 16 16 15    Temp: 97.8 F (36.6 C) 97.6 F (36.4 C) 97.7 F (36.5 C)   TempSrc: Oral Oral Oral   SpO2: 96% 96% 97% 97%  Weight:  73.3 kg    Height:        Intake/Output Summary (Last 24 hours) at 12/07/2022 1924 Last data filed at 12/07/2022 0442 Gross per 24 hour  Intake 532 ml  Output --  Net 532 ml   Filed Weights   12/03/22 0807 12/05/22 0755 12/07/22 0441  Weight: 73.6 kg 70 kg 73.3 kg   Examination: Physical Exam:  Constitutional: WN/WD Caucasian male who is resting in the chair at bedside Respiratory: Diminished to auscultation bilaterally, no wheezing, rales, rhonchi or crackles. Normal respiratory effort and patient is not tachypenic. No accessory muscle use.  Unlabored breathing Cardiovascular: RRR, no murmurs / rubs / gallops. S1 and S2 auscultated. No extremity edema.  Abdomen: Soft, mildly-tender, non-distended. Bowel sounds positive.  GU: Deferred. Musculoskeletal: No clubbing / cyanosis of digits/nails. No joint deformity upper and lower extremities. Skin: No rashes, lesions, ulcers on limited skin evaluation. No induration; Warm  and dry.  Neurologic: CN 2-12 grossly intact with no focal deficits.  Romberg sign and cerebellar reflexes not assessed.  Is a little sleepy but easily arousable Psychiatric: Normal judgment and insight. Alert and oriented x 3 once fully awake. Normal mood and  appropriate affect.   Data Reviewed: I have personally reviewed following labs and imaging studies  CBC: Recent Labs  Lab 12/03/22 0238 12/04/22 0344 12/05/22 0307 12/06/22 0450 12/07/22 0448  WBC 11.1* 12.6* 11.1* 10.2 11.4*  NEUTROABS 6.9 7.7 6.2 5.5 6.3  HGB 12.0* 11.3* 12.2* 12.4* 12.4*  HCT 37.2* 34.8* 37.3* 37.7* 37.6*  MCV 87.3 88.1 87.4 86.5 86.0  PLT 422* 373 410* 400 383   Basic Metabolic Panel: Recent Labs  Lab 12/03/22 0238 12/03/22 1222 12/04/22 0344 12/05/22 0307 12/06/22 0450 12/07/22 0448  NA 135 132* 134* 133* 134* 135  K 5.3* 3.8 3.8 3.6 4.0 4.5  CL 100 99 100 99 97* 97*  CO2 27 25 25 24 24 25   GLUCOSE 157* 196* 230* 226* 234* 244*  BUN 15 14 15 14 14 17   CREATININE 1.07 1.08 0.94 0.92 0.84 0.92  CALCIUM 8.9 8.1* 8.1* 8.4* 8.3* 8.9  MG 2.2  --  1.9 1.9 1.9 2.0  PHOS 2.7  --  3.0 2.7 2.8 3.4   GFR: Estimated Creatinine Clearance: 58.9 mL/min (by C-G formula based on SCr of 0.92 mg/dL). Liver Function Tests: Recent Labs  Lab 12/03/22 0238 12/04/22 0344 12/05/22 0307 12/06/22 0450 12/07/22 0448  AST 31 46* 42* 33 31  ALT 30 41 42 39 36  ALKPHOS 64 54 61 66 68  BILITOT 0.7 0.6 0.7 0.5 0.5  PROT 7.2 6.3* 7.1 6.7 6.7  ALBUMIN 2.6* 2.4* 2.6* 2.5* 2.6*   No results for input(s): "LIPASE", "AMYLASE" in the last 168 hours. No results for input(s): "AMMONIA" in the last 168 hours. Coagulation Profile: Recent Labs  Lab 12/02/22 0248  INR 1.1   Cardiac Enzymes: No results for input(s): "CKTOTAL", "CKMB", "CKMBINDEX", "TROPONINI" in the last 168 hours. BNP (last 3 results) No results for input(s): "PROBNP" in the last 8760 hours. HbA1C: No results for input(s): "HGBA1C" in the last 72 hours. CBG: Recent Labs  Lab 12/07/22 0009 12/07/22 0438 12/07/22 0808 12/07/22 1223 12/07/22 1646  GLUCAP 191* 214* 200* 226* 221*   Lipid Profile: No results for input(s): "CHOL", "HDL", "LDLCALC", "TRIG", "CHOLHDL", "LDLDIRECT" in the last 72  hours. Thyroid Function Tests: No results for input(s): "TSH", "T4TOTAL", "FREET4", "T3FREE", "THYROIDAB" in the last 72 hours. Anemia Panel: No results for input(s): "VITAMINB12", "FOLATE", "FERRITIN", "TIBC", "IRON", "RETICCTPCT" in the last 72 hours. Sepsis Labs: No results for input(s): "PROCALCITON", "LATICACIDVEN" in the last 168 hours.  Recent Results (from the past 240 hour(s))  C Difficile Quick Screen (NO PCR Reflex)     Status: None   Collection Time: 12/05/22  5:15 PM   Specimen: STOOL  Result Value Ref Range Status   C Diff antigen NEGATIVE NEGATIVE Final   C Diff toxin NEGATIVE NEGATIVE Final   C Diff interpretation No C. difficile detected.  Final    Comment: Performed at [facility], [address], [place], [place] [address]    Radiology Studies: DG CHEST PORT 1 VIEW  Result Date: 12/07/2022 CLINICAL DATA:  Shortness of breath. EXAM: PORTABLE CHEST 1 VIEW COMPARISON:  Chest x-ray Nov 26, 2022. FINDINGS: Enteric tube courses below the diaphragm with the tip outside the field of view. No consolidation. No visible pleural  effusions or pneumothorax. Cardiomediastinal silhouette is within normal limits and similar. IMPRESSION: No active disease. Electronically Signed   By: Feliberto Harts M.D.   On: 12/07/2022 10:48   CT PANCREAS ABD W/WO  Result Date: 12/06/2022 CLINICAL DATA:  Necrotizing pancreatitis EXAM: CT ABDOMEN WITHOUT AND WITH CONTRAST TECHNIQUE: Multidetector CT imaging of the abdomen was performed following the standard protocol before and following the bolus administration of intravenous contrast. RADIATION DOSE REDUCTION: This exam was performed according to the departmental dose-optimization program which includes automated exposure control, adjustment of the mA and/or kV according to patient size and/or use of iterative reconstruction technique. CONTRAST:  75mL OMNIPAQUE IOHEXOL 350 MG/ML SOLN COMPARISON:  None Available. FINDINGS: Lower chest: No acute  abnormality. Scarring or atelectasis of the left lung base. Coronary artery calcifications. Hepatobiliary: No focal liver abnormality is seen. Status post interval cholecystectomy. No biliary dilatation. Pancreas: , With evidence of hypoenhancing parenchymal necrosis involving the ventral pancreatic head and uncinate (series 5, image 61, 68). Overlying fat necrosis and fluid measuring in aggregate 5.1 x 4.0 cm (series 5, image 68) as well as multiple small fluid collections situated about the pancreatic body and tail, largest about the tip of the pancreatic tail measuring 4.6 x 4.4 cm (series 5, image 39) and ventral pancreatic body and neck (series 5, image 46). Spleen: Normal in size without significant abnormality. Adrenals/Urinary Tract: Adrenal glands are unremarkable. Kidneys are normal, without renal calculi, solid lesion, or hydronephrosis. Stomach/Bowel: Stomach is within normal limits. Non weighted enteric feeding tube, tip near the gastric antrum (series 5, image 51). No evidence of bowel wall thickening, distention, or inflammatory changes. Vascular/Lymphatic: Aortic atherosclerosis. No enlarged abdominal lymph nodes. Other: No abdominal wall hernia or abnormality. Minimal ascites. Small volume postoperative pneumoperitoneum. Musculoskeletal: No acute or significant osseous findings. IMPRESSION: 1. Similar appearance of the pancreas, specifically with evidence of hypoenhancing parenchymal necrosis involving the ventral pancreatic head and uncinate. Overlying fat necrosis and fluid as well as multiple small fluid collections situated about the pancreatic body and tail and ventral pancreatic body and neck. Findings are consistent with pancreatitis complicated by parenchymal necrosis as well as multiple organizing acute pancreatic fluid collections. Presence or absence of infection is not established by CT. 2. Status post interval cholecystectomy with small volume postoperative pneumoperitoneum. 3. A common  bile duct calculus identified by prior MR is not appreciated on today's examination; presumably the common bile duct was interrogated by ERCP at the time of cholecystectomy. 4. Coronary artery disease. Aortic Atherosclerosis (ICD10-I70.0). Electronically Signed   By: Jearld Lesch M.D.   On: 12/06/2022 16:49    Scheduled Meds:  cholestyramine light  2 g Oral BID   enoxaparin (LOVENOX) injection  40 mg Subcutaneous Q24H   feeding supplement  1 Container Oral TID BM   feeding supplement (PROSource TF20)  60 mL Per Tube Daily   free water  150 mL Per Tube Q4H   guaiFENesin  600 mg Oral BID   insulin aspart  0-9 Units Subcutaneous Q4H   insulin aspart  3 Units Subcutaneous Q4H   [START ON 12/08/2022] lipase/protease/amylase  72,000 Units Oral TID WC   pantoprazole  40 mg Oral Daily   sodium chloride  1 drop Both Eyes QHS   Continuous Infusions:  feeding supplement (OSMOLITE 1.5 CAL) 1,000 mL (12/07/22 1653)   methocarbamol (ROBAXIN) IV     piperacillin-tazobactam (ZOSYN)  IV 3.375 g (12/07/22 1653)    LOS: 20 days   Marguerita Merles, DO [facility]  Available via Epic secure chat 7am-7pm After these hours, please refer to coverage provider listed on [email or web address] 12/07/2022, 7:24 PM

## 2022-12-07 NOTE — Inpatient Diabetes Management (Signed)
Inpatient Diabetes Program Recommendations  AACE/ADA: New Consensus Statement on Inpatient Glycemic Control (2015)  Target Ranges:  Prepandial:   less than 140 mg/dL      Peak postprandial:   less than 180 mg/dL (1-2 hours)      Critically ill patients:  140 - 180 mg/dL   Lab Results  Component Value Date   GLUCAP 200 (H) 12/07/2022   HGBA1C 5.8 (H) 11/19/2022    Review of Glycemic Control  Latest Reference Range & Units 12/06/22 07:52 12/06/22 11:21 12/06/22 16:56 12/06/22 21:17 12/07/22 00:09 12/07/22 04:38 12/07/22 08:08  Glucose-Capillary 70 - 99 mg/dL 161 (H) 096 (H) 045 (H) 200 (H) 191 (H) 214 (H) 200 (H)  (H): Data is abnormally high  Diabetes history: DM2 Outpatient Diabetes medications: Glipizide 2.5 mg QD Current orders for Inpatient glycemic control: Novolog 0-9 units Q4H, Novolog 2 units Q4H  Inpatient Diabetes Program Recommendations:    Might consider:  Novolog 3 units Q4H; hold if feeds held or discontinued  Will continue to follow while inpatient.  Thank you, Dulce Sellar, MSN, CDCES Diabetes Coordinator Inpatient Diabetes Program [phone] (team pager from 8a-5p)

## 2022-12-07 NOTE — Progress Notes (Signed)
PHARMACIST - PHYSICIAN COMMUNICATION  DR:   Marland Mcalpine  CONCERNING: IV to Oral Route Change Policy  RECOMMENDATION: This patient is receiving pantoprazole by the intravenous route.  Based on criteria approved by the Pharmacy and Therapeutics Committee, the intravenous medication(s) is/are being converted to the equivalent oral dose form(s).   DESCRIPTION: These criteria include: The patient is eating (either orally or via tube) and/or has been taking other orally administered medications for a least 24 hours The patient has no evidence of active gastrointestinal bleeding or impaired GI absorption (gastrectomy, short bowel, patient on TNA or NPO).  If you have questions about this conversion, please contact the Pharmacy Department  []   [phone] )  [facility] []   [phone] )  [facility] [x]   [phone] )  [facility] []   [phone] )  [facility] []   [phone] )  [facility]   Rexford Maus, PharmD, BCPS 12/07/2022 3:08 PM

## 2022-12-08 DIAGNOSIS — K859 Acute pancreatitis without necrosis or infection, unspecified: Secondary | ICD-10-CM | POA: Diagnosis not present

## 2022-12-08 DIAGNOSIS — K8591 Acute pancreatitis with uninfected necrosis, unspecified: Secondary | ICD-10-CM | POA: Diagnosis not present

## 2022-12-08 DIAGNOSIS — K831 Obstruction of bile duct: Secondary | ICD-10-CM | POA: Diagnosis not present

## 2022-12-08 DIAGNOSIS — K851 Biliary acute pancreatitis without necrosis or infection: Secondary | ICD-10-CM | POA: Diagnosis not present

## 2022-12-08 LAB — CBC WITH DIFFERENTIAL/PLATELET
Abs Immature Granulocytes: 0.06 10*3/uL (ref 0.00–0.07)
Basophils Absolute: 0.1 10*3/uL (ref 0.0–0.1)
Basophils Relative: 1 %
Eosinophils Absolute: 0.7 10*3/uL — ABNORMAL HIGH (ref 0.0–0.5)
Eosinophils Relative: 6 %
HCT: 35.8 % — ABNORMAL LOW (ref 39.0–52.0)
Hemoglobin: 11.6 g/dL — ABNORMAL LOW (ref 13.0–17.0)
Immature Granulocytes: 1 %
Lymphocytes Relative: 36 %
Lymphs Abs: 3.8 10*3/uL (ref 0.7–4.0)
MCH: 27.9 pg (ref 26.0–34.0)
MCHC: 32.4 g/dL (ref 30.0–36.0)
MCV: 86.1 fL (ref 80.0–100.0)
Monocytes Absolute: 0.8 10*3/uL (ref 0.1–1.0)
Monocytes Relative: 7 %
Neutro Abs: 5.2 10*3/uL (ref 1.7–7.7)
Neutrophils Relative %: 49 %
Platelets: 358 10*3/uL (ref 150–400)
RBC: 4.16 MIL/uL — ABNORMAL LOW (ref 4.22–5.81)
RDW: 14.2 % (ref 11.5–15.5)
WBC: 10.5 10*3/uL (ref 4.0–10.5)
nRBC: 0 % (ref 0.0–0.2)

## 2022-12-08 LAB — GLUCOSE, CAPILLARY
Glucose-Capillary: 201 mg/dL — ABNORMAL HIGH (ref 70–99)
Glucose-Capillary: 137 mg/dL — ABNORMAL HIGH (ref 70–99)
Glucose-Capillary: 177 mg/dL — ABNORMAL HIGH (ref 70–99)
Glucose-Capillary: 177 mg/dL — ABNORMAL HIGH (ref 70–99)
Glucose-Capillary: 226 mg/dL — ABNORMAL HIGH (ref 70–99)

## 2022-12-08 LAB — COMPREHENSIVE METABOLIC PANEL
ALT: 35 U/L (ref 0–44)
AST: 30 U/L (ref 15–41)
Albumin: 2.5 g/dL — ABNORMAL LOW (ref 3.5–5.0)
Alkaline Phosphatase: 73 U/L (ref 38–126)
Anion gap: 10 (ref 5–15)
BUN: 17 mg/dL (ref 8–23)
CO2: 27 mmol/L (ref 22–32)
Calcium: 8.8 mg/dL — ABNORMAL LOW (ref 8.9–10.3)
Chloride: 98 mmol/L (ref 98–111)
Creatinine, Ser: 0.96 mg/dL (ref 0.61–1.24)
GFR, Estimated: >60 mL/min (ref >60)
Glucose, Bld: 191 mg/dL — ABNORMAL HIGH (ref 70–99)
Potassium: 4.3 mmol/L (ref 3.5–5.1)
Sodium: 135 mmol/L (ref 135–145)
Total Bilirubin: 0.5 mg/dL (ref 0.3–1.2)
Total Protein: 6.8 g/dL (ref 6.5–8.1)

## 2022-12-08 LAB — PHOSPHORUS: Phosphorus: 3.7 mg/dL (ref 2.5–4.6)

## 2022-12-08 LAB — MAGNESIUM: Magnesium: 2 mg/dL (ref 1.7–2.4)

## 2022-12-08 LAB — PANCREATIC ELASTASE, FECAL: Pancreatic Elastase-1, Stool: 268 ug Elast./g (ref >200)

## 2022-12-08 MED ORDER — ENSURE ENLIVE PO LIQD
237.0000 mL | Freq: Three times a day (TID) | ORAL | Status: DC
  Administered 2022-12-08 – 2022-12-10 (×4): 237 mL via ORAL

## 2022-12-08 MED ORDER — INSULIN ASPART 100 UNIT/ML IJ SOLN
0.0000 [IU] | Freq: Three times a day (TID) | INTRAMUSCULAR | Status: DC
  Administered 2022-12-08 (×2): 2 [IU] via SUBCUTANEOUS
  Administered 2022-12-09: 1 [IU] via SUBCUTANEOUS
  Administered 2022-12-09: 2 [IU] via SUBCUTANEOUS
  Administered 2022-12-10: 1 [IU] via SUBCUTANEOUS

## 2022-12-08 MED ORDER — INSULIN ASPART 100 UNIT/ML IJ SOLN
0.0000 [IU] | Freq: Every day | INTRAMUSCULAR | Status: DC
  Administered 2022-12-08: 2 [IU] via SUBCUTANEOUS

## 2022-12-08 MED ORDER — FREE WATER
150.0000 mL | Freq: Three times a day (TID) | Status: DC
  Administered 2022-12-08 – 2022-12-10 (×6): 150 mL

## 2022-12-08 MED ORDER — INSULIN ASPART 100 UNIT/ML IJ SOLN
3.0000 [IU] | Freq: Three times a day (TID) | INTRAMUSCULAR | Status: DC
  Administered 2022-12-08 – 2022-12-10 (×5): 3 [IU] via SUBCUTANEOUS

## 2022-12-08 MED ORDER — BANATROL TF EN LIQD
60.0000 mL | Freq: Two times a day (BID) | ENTERAL | Status: DC
  Administered 2022-12-08 – 2022-12-10 (×5): 60 mL
  Filled 2022-12-08 (×6): qty 60

## 2022-12-08 MED ORDER — OSMOLITE 1.5 CAL PO LIQD
780.0000 mL | ORAL | Status: DC
  Administered 2022-12-08 – 2022-12-09 (×2): 780 mL
  Filled 2022-12-08 (×3): qty 948

## 2022-12-08 NOTE — Progress Notes (Signed)
Nutrition Follow-up  DOCUMENTATION CODES:  Not applicable  INTERVENTION:  Tube feeds via NG-tube: Osmolite 1.5 at 65 mL/hr x 12 from 1800 to 0600 (780 mL per day) 60 mL ProSource TF20 - Daily Free water flush: 150 mL q4h Tube feeds at goal provides 1250 kcal, 69 gm protein, and 1450 mL total free water daily.  Discontinue Boost Breeze, replace with Ensure Enlive po TID, each supplement provides 350 kcal and 20 grams of protein. Continue Magic cup BID with meals, each supplement provides 290 kcal and 9 grams of protein Banatrol  TF - BID Calorie Count per MD  NUTRITION DIAGNOSIS:  Inadequate oral intake related to acute illness as evidenced by per patient/family report. - Ongoing   GOAL:  Patient will meet greater than or equal to 90% of their needs - Being meet via TF  MONITOR:  PO intake, Supplement acceptance, Labs, Weight trends, I & O's  REASON FOR ASSESSMENT:  Consult Enteral/tube feeding initiation and management  ASSESSMENT:  82 y.o. male presented to [facility] ED with abdominal pain, nausea, and vomiting for 3 days. PMH includes GERD, HLD, T2DM, and CAD. Pt admitted with acute gallstone pancreatitis.   5/15 - transferred to [facility]; Admitted 5/18 - diet advanced to full liquids 5/20 - NPO; s/p ERCP (unsuccessful); diet advanced to clear liquids 5/21 - diet advanced to heart healthy 5/27 - NG-tube placed (tip gastric) 5/28 - TF started 5/30 - ERCP; diet advanced to full liquids 5/31 - Op, s/p lap cholecystectomy; diet advanced to clear liquids; Cortrak placed (tip gastric) 6/01 - diet advanced to soft 6/04 - TF decreased to 30 mL/hr per GI  Pt sitting up in chair eating breakfast, wife at bedside. Reports that yesterday was his best day thus far. Has been drinking the Ensure over the [employer] now. Denies any nausea, vomiting, pain, or diarrhea associated with eating. Reports that his stools have improved and is not having a bowel movement after every time he takes in PO.  Also was given medications to help bulk stools. Discussed adjusting tube feed formula to include fiber vs Banatrol fiber supplement; pt elected to have Banatrol. Pt has been tolerating [employer] and enjoys those, discussed that pt will may be able to tolerate incorporating more fat into diet soon. Discussed that symptoms varies from pt to pt and that he may tolerate foods differently.  RD received a consult from MD for calorie count and nocturnal feeds. RD discussed with RN.  Meal Intake 6/2: 25-75% x 2 meals 6/3-6/5: no meal intakes documented  Medications reviewed and include: NovoLog SSI + 3 units TID, Creon, Protonix, IV antibiotics  Labs reviewed: Sodium 135, Potassium 4.3, Phosphorus 3.7, Magnesium 2.0 CBG: 137-226 x 24 hours   Diet Order:   Diet Order             DIET SOFT Room service appropriate? Yes; Fluid consistency: Thin  Diet effective now                  EDUCATION NEEDS: Education needs have been addressed  Skin:  Skin Assessment: Reviewed RN Assessment  Last BM:  6/4  Height:  Ht Readings from Last 1 Encounters:  12/03/22 5\' 8"  (1.727 m)   Weight:  Wt Readings from Last 1 Encounters:  12/07/22 73.3 kg   Ideal Body Weight:  70 kg  BMI:  Body mass index is 24.57 kg/m.  Estimated Nutritional Needs:  Kcal:  1900-2100 Protein:  95-115 grams Fluid:  >/= 1.9 L  Kirby Crigler RD, LDN Clinical Dietitian See Loretha Stapler for contact information.

## 2022-12-08 NOTE — Progress Notes (Addendum)
Occupational Therapy Treatment Patient Details Name: Jon Bray MRN: [id number] DOB: 12/12/1940 Today's Date: 12/08/2022   History of present illness Jon Bray is a 82 y.o. male hospitalized with necrotizing/hemorrhagic pancreatitis and previously failed ERCP and now status post biliary sphincterotomy and stone extraction and now status post cholecystectomy. PMH significant for CAD, hypertension, atrial fibrillation, hyperlipidemia, chronic renal insufficiency, nephrolithiasis, cholelithiasis, gallstone pancreatitis with retained biliary stone.   OT comments  Pt making progress with functional goals. Pt more talkative this session, very jovial. Pt's sister present and very supportive. Pt sat EOB with min guard A without physical assist to perform B UE exercises and activity tolerance x 16 minutes using level 3 green theraband 2 sets 10 reps each from OT handout provided last session. Pt requested to return to supine due to abdominal discomfort. OT will continue to follow acutely to maximize level of function and safety   Recommendations for follow up therapy are one component of a multi-disciplinary discharge planning process, led by the attending physician.  Recommendations may be updated based on patient status, additional functional criteria and insurance authorization.    Assistance Recommended at Discharge PRN  Patient can return home with the following  Assistance with cooking/housework;Assist for transportation   Equipment Recommendations  None recommended by OT    Recommendations for Other Services      Precautions / Restrictions Precautions Precautions: Fall Precaution Comments: Cortrak Restrictions Weight Bearing Restrictions: No       Mobility Bed Mobility Overal bed mobility: Needs Assistance Bed Mobility: Supine to Sit, Sit to Supine     Supine to sit: Min guard Sit to supine: Min guard   General bed mobility comments: Pt sat EOB for B UE exercises and activity  tolerance x 16 minutes    Transfers                         Balance                                           ADL either performed or assessed with clinical judgement   ADL                                         General ADL Comments: Pt with improved activity tolerance this session    Extremity/Trunk Assessment Upper Extremity Assessment Upper Extremity Assessment: Overall WFL for tasks assessed   Lower Extremity Assessment Lower Extremity Assessment: Defer to PT evaluation   Cervical / Trunk Assessment Cervical / Trunk Assessment: Normal    Vision Baseline Vision/History: 1 Wears glasses Ability to See in Adequate Light: 0 Adequate Patient Visual Report: No change from baseline     Perception     Praxis      Cognition Arousal/Alertness: Awake/alert Behavior During Therapy: WFL for tasks assessed/performed Overall Cognitive Status: Within Functional Limits for tasks assessed                                 General Comments: pt more talkative and jovial this session        Exercises Other Exercises Other Exercises: Pt sat EOB for B UE exercises and activity tolerance x 16 minutes using level  3 green theraband 2 sets 10 reps each from OT handout provided last session. Pt requested to retunr to supine due to abdominal discomfort    Shoulder Instructions       General Comments      Pertinent Vitals/ Pain       Pain Assessment Pain Score: 5  (2 at rest, 5 after sitting EOB x 16 minutes) Pain Location: abdomen Pain Descriptors / Indicators: Aching, Discomfort, Grimacing, Guarding, Sore, Operative site guarding Pain Intervention(s): Limited activity within patient's tolerance, Monitored during session, Repositioned  Home Living                                          Prior Functioning/Environment              Frequency  Min 2X/week        Progress Toward Goals  OT  Goals(current goals can now be found in the care plan section)  Progress towards OT goals: Progressing toward goals     Plan Discharge plan remains appropriate    Co-evaluation                 AM-PAC OT "6 Clicks" Daily Activity     Outcome Measure   Help from another person eating meals?: Total Help from another person taking care of personal grooming?: None Help from another person toileting, which includes using toliet, bedpan, or urinal?: None Help from another person bathing (including washing, rinsing, drying)?: None Help from another person to put on and taking off regular upper body clothing?: None Help from another person to put on and taking off regular lower body clothing?: None 6 Click Score: 21    End of Session    OT Visit Diagnosis: Muscle weakness (generalized) (M62.81);Pain Pain - part of body:  (abdomen)   Activity Tolerance Patient tolerated treatment well;Patient limited by pain   Patient Left with call bell/phone within reach;with family/visitor present;in bed   Nurse Communication          Time: 7846-9629 OT Time Calculation (min): 19 min  Charges: OT General Charges $OT Visit: 1 Visit OT Treatments $Therapeutic Activity: 8-22 mins    Galen Manila 12/08/2022, 2:25 PM

## 2022-12-08 NOTE — Progress Notes (Signed)
[facility] PROGRESS NOTE    Progress Note  Jon Bray  ZOX:[id number] DOB: 1941-04-05 DOA: 11/17/2022 PCP: Danella Penton, MD     Brief Narrative:   Jon Bray an 82 y.o. male past medical history significant for diabetes mellitus type 2, essential hypertension, NSTEMI with stent in 2014, paroxysmal atrial fibrillation not on anticoagulation brought into [facility] with complaint of worsening nausea vomiting.   In March saw his PCP for loss of appetite weight loss and elevate LFTs, on the 27th CT of the abdomen pelvis showed focal wall thickening of the urinary bladder with UVJ suspicious neoplasm, extensive diverticulosis.  09/30/2022 MRCP was done patient refused to go to the ED was referred as an outpatient to general surgery and GI.    On 10/12/2022 so general surgery recommended cholecystectomy but he refused as he wanted to wait.  11/16/2022 brought into the ED for nausea vomiting lipase of 10,000 MRI acute pancreatitis with choledocholithiasis with a distal CBD stone.  Started on IV fluids antibiotics and analgesics.  General surgery was consulted ERCP attempted but had to be aborted.  Follow-up MRCP showed persistent small stone in CBD, LFTs to be improving.  Underwent ERCP on 12/02/2022.  Tube feedings were initiated on 11/22/2022.  Underwent lap chole 12/03/2022, GI recommended tube feedings.   Significant studies: 09/30/2022 MRI/MRCP showed 2 tiny gallstones near the ampulla. 12/02/2022 underwent ERCP and stone extraction 01/02/2023 CT scan of the abdomen pelvis showed overlying necrosis with fluid collection in the tail and ventral pancreatic body and neck as well as multiple fluid flexion.  Antibiotics: 11/16/2022 IV Zosyn  Microbiology data: Blood culture:  Procedures: None  Assessment/Plan:   Acute gallstone pancreatitis with hemorrhage and necrosis with mass effect of the distal common bile duct: Currently on IV Zosyn Status post lap chole 12/03/2022.   General surgery and GI on board. Patient was started on tube feedings on 11/30/2022.  Tube feedings have been held. Currently advancing his diet to low heart healthy, and hopefully tube feedings could be titrated down. Further management per surgery and GI. Will need repeat imaging in 2 weeks. Currently on Creon 3 times a day Prevalite 2 g twice a day.  Biliary atelectasis: Currently asymptomatic.  CAD/STEMI in 2014/hyperlipidemia: Aspirin on hold, not on statin as an outpatient.  Paroxysmal atrial fibrillation: Not on anticoagulation as an outpatient.  Diabetes mellitus type 2: With an A1c of 5.9 Continue sliding scale insulin, blood glucose fairly controlled.  GERD: Continue PPI.  Urinary bladder wall thickening: Suspicious for neoplasm. Will need a cystoscopy as an outpatient.  Duration of hematuria. On 11/24/2022 urology was consulted recommended to follow-up cystoscopy as an outpatient.  Extensive diverticulosis/chronic constipation: Continue MiraLAX daily, having diarrhea due to tube feedings.  Hyponatremia: Now resolved.  Anemia of chronic illness: Likely due to pancreatitis and acute cholecystitis. Hemoglobin has remained essentially stable. No signs of overt bleeding.  Hypokalemia: Likely hemolyzed sample.  Hypoalbuminemia:  currently on tube feedings tolerating his diet.  DVT prophylaxis: lovenox Family Communication: Wife Status Bray: Inpatient Remains inpatient appropriate because: Acute necrotic pancreatitis    Code Status:     Code Status Orders  (From admission, onward)           Start     Ordered   11/17/22 1733  Full code  Continuous       Question:  By:  Answer:  Consent: discussion documented in EHR   11/17/22 1738  Code Status History     This patient has a current code status but no historical code status.      Advance Directive Documentation    Flowsheet Row Most Recent Value  Type of Advance Directive Living  will  Pre-existing out of facility DNR order (yellow form or pink MOST form) --  "MOST" Form in Place? --         IV Access:   Peripheral IV   Procedures and diagnostic studies:   DG CHEST PORT 1 VIEW  Result Date: 12/07/2022 CLINICAL DATA:  Shortness of breath. EXAM: PORTABLE CHEST 1 VIEW COMPARISON:  Chest x-ray Nov 26, 2022. FINDINGS: Enteric tube courses below the diaphragm with the tip outside the field of view. No consolidation. No visible pleural effusions or pneumothorax. Cardiomediastinal silhouette Bray within normal limits and similar. IMPRESSION: No active disease. Electronically Signed   By: Feliberto Harts M.D.   On: 12/07/2022 10:48   CT PANCREAS ABD W/WO  Result Date: 12/06/2022 CLINICAL DATA:  Necrotizing pancreatitis EXAM: CT ABDOMEN WITHOUT AND WITH CONTRAST TECHNIQUE: Multidetector CT imaging of the abdomen was performed following the standard protocol before and following the bolus administration of intravenous contrast. RADIATION DOSE REDUCTION: This exam was performed according to the departmental dose-optimization program which includes automated exposure control, adjustment of the mA and/or kV according to patient size and/or use of iterative reconstruction technique. CONTRAST:  75mL OMNIPAQUE IOHEXOL 350 MG/ML SOLN COMPARISON:  None Available. FINDINGS: Lower chest: No acute abnormality. Scarring or atelectasis of the left lung base. Coronary artery calcifications. Hepatobiliary: No focal liver abnormality Bray seen. Status post interval cholecystectomy. No biliary dilatation. Pancreas: , With evidence of hypoenhancing parenchymal necrosis involving the ventral pancreatic head and uncinate (series 5, image 61, 68). Overlying fat necrosis and fluid measuring in aggregate 5.1 x 4.0 cm (series 5, image 68) as well as multiple small fluid collections situated about the pancreatic body and tail, largest about the tip of the pancreatic tail measuring 4.6 x 4.4 cm (series 5,  image 39) and ventral pancreatic body and neck (series 5, image 46). Spleen: Normal in size without significant abnormality. Adrenals/Urinary Tract: Adrenal glands are unremarkable. Kidneys are normal, without renal calculi, solid lesion, or hydronephrosis. Stomach/Bowel: Stomach Bray within normal limits. Non weighted enteric feeding tube, tip near the gastric antrum (series 5, image 51). No evidence of bowel wall thickening, distention, or inflammatory changes. Vascular/Lymphatic: Aortic atherosclerosis. No enlarged abdominal lymph nodes. Other: No abdominal wall hernia or abnormality. Minimal ascites. Small volume postoperative pneumoperitoneum. Musculoskeletal: No acute or significant osseous findings. IMPRESSION: 1. Similar appearance of the pancreas, specifically with evidence of hypoenhancing parenchymal necrosis involving the ventral pancreatic head and uncinate. Overlying fat necrosis and fluid as well as multiple small fluid collections situated about the pancreatic body and tail and ventral pancreatic body and neck. Findings are consistent with pancreatitis complicated by parenchymal necrosis as well as multiple organizing acute pancreatic fluid collections. Presence or absence of infection Bray not established by CT. 2. Status post interval cholecystectomy with small volume postoperative pneumoperitoneum. 3. A common bile duct calculus identified by prior MR Bray not appreciated on today's examination; presumably the common bile duct was interrogated by ERCP at the time of cholecystectomy. 4. Coronary artery disease. Aortic Atherosclerosis (ICD10-I70.0). Electronically Signed   By: Jearld Lesch M.D.   On: 12/06/2022 16:49     Medical Consultants:   None.   Subjective:    Jon Bray tolerating his diet but he relates  he Bray not hungry.  Objective:    Vitals:   12/07/22 1651 12/07/22 2000 12/08/22 0426 12/08/22 0819  BP: 136/84 (!) 128/92 137/84 135/71  Pulse: 85 93 83 82  Resp:  18 14    Temp:  97.9 F (36.6 C) 97.9 F (36.6 C) 97.6 F (36.4 C)  TempSrc:  Oral Oral Oral  SpO2: 97% 98% 96% 96%  Weight:      Height:       SpO2: 96 % O2 Flow Rate (L/min): 2 L/min   Intake/Output Summary (Last 24 hours) at 12/08/2022 0934 Last data filed at 12/07/2022 2000 Gross per 24 hour  Intake 740.36 ml  Output --  Net 740.36 ml   Filed Weights   12/03/22 0807 12/05/22 0755 12/07/22 0441  Weight: 73.6 kg 70 kg 73.3 kg    Exam: General exam: In no acute distress, core track Respiratory system: Good air movement and clear to auscultation. Cardiovascular system: S1 & S2 heard, RRR. No JVD. Gastrointestinal system: Abdomen Bray nondistended, soft and nontender.  Extremities: No pedal edema. Skin: No rashes, lesions or ulcers Psychiatry: Judgement and insight appear normal. Mood & affect appropriate.    Data Reviewed:    Labs: Basic Metabolic Panel: Recent Labs  Lab 12/04/22 0344 12/05/22 0307 12/06/22 0450 12/07/22 0448 12/08/22 0430  NA 134* 133* 134* 135 135  K 3.8 3.6 4.0 4.5 4.3  CL 100 99 97* 97* 98  CO2 25 24 24 25 27   GLUCOSE 230* 226* 234* 244* 191*  BUN 15 14 14 17 17   CREATININE 0.94 0.92 1.61 0.92 0.96  CALCIUM 8.1* 8.4* 8.3* 8.9 8.8*  MG 1.9 1.9 1.9 2.0 2.0  PHOS 3.0 2.7 2.8 3.4 3.7   GFR Estimated Creatinine Clearance: 56.4 mL/min (by C-G formula based on SCr of 0.96 mg/dL). Liver Function Tests: Recent Labs  Lab 12/04/22 0344 12/05/22 0307 12/06/22 0450 12/07/22 0448 12/08/22 0430  AST 46* 42* 33 31 30  ALT 41 42 39 36 35  ALKPHOS 54 61 66 68 73  BILITOT 0.6 0.7 0.5 0.5 0.5  PROT 6.3* 7.1 6.7 6.7 6.8  ALBUMIN 2.4* 2.6* 2.5* 2.6* 2.5*   No results for input(s): "LIPASE", "AMYLASE" in the last 168 hours. No results for input(s): "AMMONIA" in the last 168 hours. Coagulation profile Recent Labs  Lab 12/02/22 0248  INR 1.1   COVID-19 Labs  Recent Labs    12/06/22 0450  CRP 1.8*    No results found for:  "SARSCOV2NAA"  CBC: Recent Labs  Lab 12/04/22 0344 12/05/22 0307 12/06/22 0450 12/07/22 0448 12/08/22 0430  WBC 12.6* 11.1* 10.2 11.4* 10.5  NEUTROABS 7.7 6.2 5.5 6.3 5.2  HGB 11.3* 12.2* 12.4* 12.4* 11.6*  HCT 34.8* 37.3* 37.7* 37.6* 35.8*  MCV 88.1 87.4 86.5 86.0 86.1  PLT 373 410* 400 383 358   Cardiac Enzymes: No results for input(s): "CKTOTAL", "CKMB", "CKMBINDEX", "TROPONINI" in the last 168 hours. BNP (last 3 results) No results for input(s): "PROBNP" in the last 8760 hours. CBG: Recent Labs  Lab 12/07/22 1646 12/07/22 2046 12/07/22 2350 12/08/22 0424 12/08/22 0823  GLUCAP 221* 184* 163* 201* 137*   D-Dimer: No results for input(s): "DDIMER" in the last 72 hours. Hgb A1c: No results for input(s): "HGBA1C" in the last 72 hours. Lipid Profile: No results for input(s): "CHOL", "HDL", "LDLCALC", "TRIG", "CHOLHDL", "LDLDIRECT" in the last 72 hours. Thyroid function studies: No results for input(s): "TSH", "T4TOTAL", "T3FREE", "THYROIDAB" in the last 72 hours.  Invalid input(s): "FREET3" Anemia work up: No results for input(s): "VITAMINB12", "FOLATE", "FERRITIN", "TIBC", "IRON", "RETICCTPCT" in the last 72 hours. Sepsis Labs: Recent Labs  Lab 12/05/22 0307 12/06/22 0450 12/07/22 0448 12/08/22 0430  WBC 11.1* 10.2 11.4* 10.5   Microbiology Recent Results (from the past 240 hour(s))  C Difficile Quick Screen (NO PCR Reflex)     Status: None   Collection Time: 12/05/22  5:15 PM   Specimen: STOOL  Result Value Ref Range Status   C Diff antigen NEGATIVE NEGATIVE Final   C Diff toxin NEGATIVE NEGATIVE Final   C Diff interpretation No C. difficile detected.  Final    Comment: Performed at [facility], [address] [place], [place] [address]     Medications:    cholestyramine light  2 g Oral BID   enoxaparin (LOVENOX) injection  40 mg Subcutaneous Q24H   feeding supplement  237 mL Oral TID BM   feeding supplement (PROSource TF20)  60 mL Per  Tube Daily   fiber supplement (BANATROL TF)  60 mL Per Tube BID   free water  150 mL Per Tube Q4H   guaiFENesin  600 mg Oral BID   insulin aspart  0-9 Units Subcutaneous Q4H   insulin aspart  3 Units Subcutaneous Q4H   lipase/protease/amylase  72,000 Units Oral TID WC   pantoprazole  40 mg Oral Daily   sodium chloride  1 drop Both Eyes QHS   Continuous Infusions:  feeding supplement (OSMOLITE 1.5 CAL) 1,000 mL (12/07/22 1653)   methocarbamol (ROBAXIN) IV     piperacillin-tazobactam (ZOSYN)  IV 3.375 g (12/08/22 0820)      LOS: 21 days   Marinda Elk  [facility]  12/08/2022, 9:34 AM

## 2022-12-09 DIAGNOSIS — K8051 Calculus of bile duct without cholangitis or cholecystitis with obstruction: Secondary | ICD-10-CM | POA: Diagnosis not present

## 2022-12-09 DIAGNOSIS — K219 Gastro-esophageal reflux disease without esophagitis: Secondary | ICD-10-CM | POA: Diagnosis not present

## 2022-12-09 DIAGNOSIS — I2510 Atherosclerotic heart disease of native coronary artery without angina pectoris: Secondary | ICD-10-CM | POA: Diagnosis not present

## 2022-12-09 DIAGNOSIS — K851 Biliary acute pancreatitis without necrosis or infection: Secondary | ICD-10-CM | POA: Diagnosis not present

## 2022-12-09 LAB — GLUCOSE, CAPILLARY
Glucose-Capillary: 183 mg/dL — ABNORMAL HIGH (ref 70–99)
Glucose-Capillary: 113 mg/dL — ABNORMAL HIGH (ref 70–99)
Glucose-Capillary: 129 mg/dL — ABNORMAL HIGH (ref 70–99)
Glucose-Capillary: 190 mg/dL — ABNORMAL HIGH (ref 70–99)

## 2022-12-09 MED ORDER — AMOXICILLIN-POT CLAVULANATE 875-125 MG PO TABS
1.0000 | ORAL_TABLET | Freq: Two times a day (BID) | ORAL | Status: DC
  Administered 2022-12-09 – 2022-12-10 (×3): 1 via ORAL
  Filled 2022-12-09 (×3): qty 1

## 2022-12-09 NOTE — Progress Notes (Signed)
Physical Therapy Treatment and Discharge Patient Details Name: Jon Bray MRN: [id number] DOB: [birth year] Today's Date: 12/09/2022   History of Present Illness Jon Bray is a 82 y.o. male hospitalized with necrotizing/hemorrhagic pancreatitis and previously failed ERCP and now status post biliary sphincterotomy and stone extraction and now status post cholecystectomy. PMH significant for CAD, hypertension, atrial fibrillation, hyperlipidemia, chronic renal insufficiency, nephrolithiasis, cholelithiasis, gallstone pancreatitis with retained biliary stone.    PT Comments    Patient seen walking in hall with wife earlier this morning. Agrees to stair training as final goal to achieve discharge from PT. Patient able to complete stairs without use of rail to simulate home set-up without difficulty. No further PT goals and pt is discharged from PT at this time. Mobility Team to continue to follow.     Recommendations for follow up therapy are one component of a multi-disciplinary discharge planning process, led by the attending physician.  Recommendations may be updated based on patient status, additional functional criteria and insurance authorization.  Follow Up Recommendations       Assistance Recommended at Discharge PRN  Patient can return home with the following Assistance with cooking/housework;Assist for transportation;Help with stairs or ramp for entrance   Equipment Recommendations  None recommended by PT    Recommendations for Other Services       Precautions / Restrictions Precautions Precautions: Fall Precaution Comments: [place]rak Restrictions Weight Bearing Restrictions: No     Mobility  Bed Mobility Overal bed mobility: Needs Assistance Bed Mobility: Supine to Sit, Sit to Supine     Supine to sit: Min assist     General bed mobility comments: wife provides assist, despite pt able to complete    Transfers Overall transfer level: Independent Equipment used:  None Transfers: Sit to/from Stand Sit to Stand: Independent           General transfer comment: from EOB and standard toilet    Ambulation/Gait Ambulation/Gait assistance: Modified independent (Device/Increase time) Gait Distance (Feet): 200 Feet Assistive device: 1 person hand held assist Gait Pattern/deviations: Step-through pattern, Decreased stride length, Trunk flexed Gait velocity: Fair     General Gait Details: holds hands with his wife, slow guarded gait due to operative site pain and discomfort   Stairs   Stairs assistance: Supervision Stair Management: Step to pattern, Forwards, No rails Number of Stairs: 2 General stair comments: upr down steps in stairwell without fault no cues needed   Wheelchair Mobility    Modified Rankin (Stroke Patients Only)       Balance Overall balance assessment: No apparent balance deficits (not formally assessed)                                          Cognition Arousal/Alertness: Awake/alert Behavior During Therapy: WFL for tasks assessed/performed Overall Cognitive Status: Within Functional Limits for tasks assessed                                          Exercises      General Comments General comments (skin integrity, edema, etc.): Wife present. Reminds herself that she needs to let pt do for himselft      Pertinent Vitals/Pain Pain Assessment Pain Assessment: 0-10 Pain Score: 5  Pain Location: abdomen Pain Descriptors / Indicators: Aching, Discomfort, Guarding,  Sore, Operative site guarding Pain Intervention(s): Limited activity within patient's tolerance, Monitored during session    Home Living                          Prior Function            PT Goals (current goals can now be found in the care plan section) Acute Rehab PT Goals Patient Stated Goal: to play golf PT Goal Formulation: With patient/family Time For Goal Achievement: 12/17/22 Potential  to Achieve Goals: Good Progress towards PT goals: Goals met/education completed, patient discharged from PT    Frequency    Min 3X/week      PT Plan Current plan remains appropriate    Co-evaluation              AM-PAC PT "6 Clicks" Mobility   Outcome Measure  Help needed turning from your back to your side while in a flat bed without using bedrails?: None Help needed moving from lying on your back to sitting on the side of a flat bed without using bedrails?: None Help needed moving to and from a bed to a chair (including a wheelchair)?: None Help needed standing up from a chair using your arms (e.g., wheelchair or bedside chair)?: None Help needed to walk in hospital room?: None Help needed climbing 3-5 steps with a railing? : A Little 6 Click Score: 23    End of Session   Activity Tolerance: Patient tolerated treatment well Patient left: in chair;with call bell/phone within reach;with family/visitor present   PT Visit Diagnosis: Muscle weakness (generalized) (M62.81);Difficulty in walking, not elsewhere classified (R26.2);Pain Pain - Right/Left: Right Pain - part of body:  (abdomen)   PT Discharge Note  Patient is being discharged from PT services secondary to:  Goals met and no further therapy needs identified.  Please see latest Therapy Progress Note for current level of functioning and progress toward goals.  Progress and discharge plan and discussed with patient/caregiver and they  Agree   Time: 1034-1050 PT Time Calculation (min) (ACUTE ONLY): 16 min  Charges:  $Gait Training: 8-22 mins                      Jerolyn Center, PT Acute Rehabilitation Services  Office [phone]    Zena Amos 12/09/2022, 10:57 AM

## 2022-12-09 NOTE — Progress Notes (Addendum)
Patient ID: Jon Bray, male   DOB: 04-05-1941, 82 y.o.   MRN: [id number]    Progress Note   Subjective   Day # 22 CC; all stone pancreatitis, history of retained biliary stone prior failed ERCP now status post biliary sphincterotomy stone extraction and laparoscopic cholecystectomy prolonged course with necrotizing/hemorrhagic pancreatitis.  Tube feeding switched to nocturnal feeds IV Zosyn Prevalite/Creon  Labs yesterday-WBC 10.5/hemoglobin 11.6/hematocrit 35.8 BUN 17/creatinine 0.96 LFTs normal  Fecal elastase 268 normal  Doing well walking the halls, he gets a sharp pain in the right upper quadrant which he associates with one of his incisional ports.  Says this has been exquisitely sensitive and as soon as he sits down not feel this pain.  He is continuing to make good efforts at increasing his oral intake nausea or vomiting.  Not using any regular IV analgesics.   Objective   Vital signs in last 24 hours: Temp:  [97.7 F (36.5 C)-97.9 F (36.6 C)] 97.8 F (36.6 C) (06/06 0805) Pulse Rate:  [89-92] 89 (06/06 0805) Resp:  [14-16] 14 (06/06 0412) BP: (121-138)/(79-84) 121/83 (06/06 0805) SpO2:  [95 %-97 %] 95 % (06/06 0412) Last BM Date : 12/08/22 General:    Elderly white male in NAD, sitting up in chair, wife at bedside, enteral tube in left nares Heart:  Regular rate and rhythm; no murmurs Lungs: Respirations even and unlabored, lungs CTA bilaterally Abdomen:  Soft, nondistended, tender to very light touch in the right upper quadrant around both of the incisional ports, mild epigastric tenderness no rebound normal bowel sounds. Extremities:  Without edema. Neurologic:  Alert and oriented,  grossly normal neurologically. Psych:  Cooperative. Normal mood and affect.  Intake/Output from previous day: 06/05 0701 - 06/06 0700 In: 972 [NG/GT:722; IV Piggyback:250] Out: -  Intake/Output this shift: No intake/output data recorded.  Lab Results: Recent Labs     12/07/22 0448 12/08/22 0430  WBC 11.4* 10.5  HGB 12.4* 11.6*  HCT 37.6* 35.8*  PLT 383 358   BMET Recent Labs    12/07/22 0448 12/08/22 0430  NA 135 135  K 4.5 4.3  CL 97* 98  CO2 25 27  GLUCOSE 244* 191*  BUN 17 17  CREATININE 0.92 0.96  CALCIUM 8.9 8.8*   LFT Recent Labs    12/08/22 0430  PROT 6.8  ALBUMIN 2.5*  AST 30  ALT 35  ALKPHOS 73  BILITOT 0.5   PT/INR No results for input(s): "LABPROT", "INR" in the last 72 hours.      Assessment / Plan:     #62 82 year old white male with diabetes mellitus, hypertension, history of previous MI with stent 2014 and history of atrial fibrillation who is admitted about 3 weeks ago with worsening abdominal pain nausea and vomiting. He has had a long, somewhat complicated course due to severe necrotizing pancreatitis, gallstone induced. He is now status post successful ERCP and stone extraction on 12/02/2022 and underwent laparoscopic cholecystectomy 12/03/2022  CT imaging earlier this week shows evidence of hypoenhancing parenchymal necrosis involving the ventral pancreatic head and uncinate process, and multiple small fluid collections situated around the pancreatic body and tail largest at the tail measuring 4.6 x 4.4 cm, no abscess.  He is continuing to improve, oral intake has been an issue, he has improved his intake this week significantly, tube feeds are now being given at nighttime.  He has been afebrile, no leukocytosis, currently still on Zosyn-gust with Dr. Meridee Score  will DC IV Zosyn and  give him 1 more week of oral Augmentin  Plan;  I think he is approaching discharge, from GI perspective may be able to be discharged tomorrow Will leave tube feeds for tonight, then okay to discontinue enteral feeding tube if to be discharged tomorrow if not would leave nocturnal feeds going until he is discharged  Encouraged him to use oral analgesics first and avoid Dilaudid if possible.  He relates he has been taking pain  medication at nighttime just to sleep, suggested Tylenol PM at home.  Plan from GI perspective is for follow-up CT in about 12 days with pancreatic protocol-our office will arrange this, we will also arrange for outpatient follow-up with Dr. 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 I performed a substantive portion of this encounter, including complete performance of at least one of the key components, in conjunction with the APP. I agree with the APP's note, impression and recommendations.   Overall feeling better. Diarrhea has improved with Creon and cholestyramine.  He had a formed bowel movement this morning. He is tolerating nocturnal tube feeds better  Transition to oral Augmentin on discharge to complete 1 week course. Continued oral intake as tolerated with high-protein diet  Repeat CT in 2 weeks and follow-up in GI office as scheduled  He may be able to go home soon  GI is available if needed, please call with any questions   The patient was provided an opportunity to ask questions and all were answered. The patient agreed with the plan and demonstrated an understanding of the instructions.   Iona Beard , MD [phone]

## 2022-12-09 NOTE — Progress Notes (Signed)
Calorie Count Note - Day 1 Results   48 hour calorie count ordered.  Diet: Soft Supplements: Ensure Enlive - TID, Magic Cup - BID  Estimated Nutritional Needs:  Kcal:  1900-2100 Protein:  95-115 grams Fluid:  >/= 1.9 L  6/5 Breakfast: 1/2 oatmeal (70 kcal, 2.5 gm protein) 6/5 Lunch: 1/2 Egg salad, 1/2 chocolate chip cookie, 1/2 baked chips (302 kcal, 9 gm protein) 6/5 Dinner: 1/4 potato soup (37 kcal, 1 gm protein) Supplements: 1 Ensure, 1 Boost, 1 1/4 Magic Cup (963 kcal, 40 gm protein)  Total intake: 1372 kcal (72% of minimum estimated needs)  52.5 protein (55% of minimum estimated needs)  Nutrition Diagnosis: Inadequate oral intake related to acute illness as evidenced by per patient/family report   Intervention:  Tube feeds via NG-tube: Osmolite 1.5 at 65 mL/hr x 12 from 1800 to 0600 (780 mL per day) 60 mL ProSource TF20 - Daily Free water flush: 150 mL q4h Tube feeds at goal provides 1250 kcal, 69 gm protein, and 1450 mL total free water daily.  Ensure Enlive po TID, each supplement provides 350 kcal and 20 grams of protein. Magic cup BID with meals, each supplement provides 290 kcal and 9 grams of protein Banatrol  TF - BID Calorie Count    Kirby Crigler RD, LDN Clinical Dietitian See [facility] for contact information.

## 2022-12-09 NOTE — Progress Notes (Signed)
[facility] PROGRESS NOTE    Progress Note  Jon Bray  WNU:[id number] DOB: 04/13/1941 DOA: 11/17/2022 PCP: Danella Penton, MD     Brief Narrative:   Jon Bray is an 82 y.o. male past medical history significant for diabetes mellitus type 2, essential hypertension, NSTEMI with stent in 2014, paroxysmal atrial fibrillation not on anticoagulation brought into [facility] with complaint of worsening nausea vomiting.   In March saw his PCP for loss of appetite weight loss and elevate LFTs, on the 27th CT of the abdomen pelvis showed focal wall thickening of the urinary bladder with UVJ suspicious neoplasm, extensive diverticulosis.  09/30/2022 MRCP was done patient refused to go to the ED was referred as an outpatient to general surgery and GI.    On 10/12/2022 so general surgery recommended cholecystectomy but he refused as he wanted to wait.  11/16/2022 brought into the ED for nausea vomiting lipase of 10,000 MRI acute pancreatitis with choledocholithiasis with a distal CBD stone.  Started on IV fluids antibiotics and analgesics.  General surgery was consulted ERCP attempted but had to be aborted.  Follow-up MRCP showed persistent small stone in CBD, LFTs to be improving.  Underwent ERCP on 12/02/2022.  Tube feedings were initiated on 11/22/2022.  Underwent lap chole 12/03/2022, GI recommended tube feedings.   Significant studies: 09/30/2022 MRI/MRCP showed 2 tiny gallstones near the ampulla. 12/02/2022 underwent ERCP and stone extraction 01/02/2023 CT scan of the abdomen pelvis showed overlying necrosis with fluid collection in the tail and ventral pancreatic body and neck as well as multiple fluid flexion.  Antibiotics: 11/16/2022 IV Zosyn  Microbiology data: Blood culture:  Procedures: None  Assessment/Plan:   Acute gallstone pancreatitis with hemorrhage and necrosis with mass effect of the distal common bile duct: Currently on IV Zosyn Status post lap chole 12/03/2022.   General surgery and GI on board. Patient was started on tube feedings on 11/30/2022.  Tube feedings have been held. Tube feeding changed to nocturnal feeding. Further management per surgery and GI. Will need repeat imaging in 2 weeks. Currently on Creon 3 times a day Prevalite 2 g twice a day.  Basilar atelectasis: Currently asymptomatic. Satting greater on room air.  CAD/STEMI in 2014/hyperlipidemia: Aspirin on hold, not on statin as an outpatient.  Paroxysmal atrial fibrillation: Not on anticoagulation as an outpatient.  Diabetes mellitus type 2: With an A1c of 5.9 Continue sliding scale insulin, blood glucose fairly controlled.  GERD: Continue PPI.  Urinary bladder wall thickening: Suspicious for neoplasm. Will need a cystoscopy as an outpatient.  Duration of hematuria. On 11/24/2022 urology was consulted recommended to follow-up cystoscopy as an outpatient.  Extensive diverticulosis/chronic constipation: Continue MiraLAX daily, having diarrhea due to tube feedings.  Hyponatremia: Now resolved.  Anemia of chronic illness: Likely due to pancreatitis and acute cholecystitis. Hemoglobin has remained essentially stable. No signs of overt bleeding.  Hypokalemia: Likely hemolyzed sample.  Hypoalbuminemia:  currently on tube feedings tolerating his diet.  DVT prophylaxis: lovenox Family Communication: Wife Status is: Inpatient Remains inpatient appropriate because: Acute necrotic pancreatitis    Code Status:     Code Status Orders  (From admission, onward)           Start     Ordered   11/17/22 1733  Full code  Continuous       Question:  By:  Answer:  Consent: discussion documented in EHR   11/17/22 1738           Code Status History  This patient has a current code status but no historical code status.      Advance Directive Documentation    Flowsheet Row Most Recent Value  Type of Advance Directive Living will  Pre-existing out of  facility DNR order (yellow form or pink MOST form) --  "MOST" Form in Place? --         IV Access:   Peripheral IV   Procedures and diagnostic studies:   No results found.   Medical Consultants:   None.   Subjective:    Jon Bray tolerated the diet this morning he relates he was hungry this morning.  Objective:    Vitals:   12/08/22 0819 12/08/22 2015 12/09/22 0412 12/09/22 0805  BP: 135/71 129/79 138/84 121/83  Pulse: 82 89 92 89  Resp: 20 16 14    Temp: 97.6 F (36.4 C) 97.9 F (36.6 C) 97.7 F (36.5 C) 97.8 F (36.6 C)  TempSrc: Oral Oral Oral Oral  SpO2: 96% 97% 95%   Weight:      Height:       SpO2: 95 % O2 Flow Rate (L/min): 2 L/min   Intake/Output Summary (Last 24 hours) at 12/09/2022 0829 Last data filed at 12/09/2022 0554 Gross per 24 hour  Intake 972.02 ml  Output --  Net 972.02 ml    Filed Weights   12/03/22 0807 12/05/22 0755 12/07/22 0441  Weight: 73.6 kg 70 kg 73.3 kg    Exam: General exam: In no acute distress, core track Respiratory system: Good air movement and clear to auscultation. Cardiovascular system: S1 & S2 heard, RRR. No JVD. Gastrointestinal system: Abdomen is nondistended, soft and nontender.  Extremities: No pedal edema. Skin: No rashes, lesions or ulcers Psychiatry: Judgement and insight appear normal. Mood & affect appropriate.    Data Reviewed:    Labs: Basic Metabolic Panel: Recent Labs  Lab 12/04/22 0344 12/05/22 0307 12/06/22 0450 12/07/22 0448 12/08/22 0430  NA 134* 133* 134* 135 135  K 3.8 3.6 4.0 4.5 4.3  CL 100 99 97* 97* 98  CO2 25 24 24 25 27   GLUCOSE 230* 226* 234* 244* 191*  BUN 15 14 14 17 17   CREATININE 0.94 0.92 0.84 0.92 0.96  CALCIUM 8.1* 8.4* 8.3* 8.9 8.8*  MG 1.9 1.9 1.9 2.0 2.0  PHOS 3.0 2.7 2.8 3.4 3.7    GFR Estimated Creatinine Clearance: 56.4 mL/min (by C-G formula based on SCr of 0.96 mg/dL). Liver Function Tests: Recent Labs  Lab 12/04/22 0344 12/05/22 0307  12/06/22 0450 12/07/22 0448 12/08/22 0430  AST 46* 42* 33 31 30  ALT 41 42 39 36 35  ALKPHOS 54 61 66 68 73  BILITOT 0.6 0.7 0.5 0.5 0.5  PROT 6.3* 7.1 6.7 6.7 6.8  ALBUMIN 2.4* 2.6* 2.5* 2.6* 2.5*    No results for input(s): "LIPASE", "AMYLASE" in the last 168 hours. No results for input(s): "AMMONIA" in the last 168 hours. Coagulation profile No results for input(s): "INR", "PROTIME" in the last 168 hours.  COVID-19 Labs  No results for input(s): "DDIMER", "FERRITIN", "LDH", "CRP" in the last 72 hours.   No results found for: "SARSCOV2NAA"  CBC: Recent Labs  Lab 12/04/22 0344 12/05/22 0307 12/06/22 0450 12/07/22 0448 12/08/22 0430  WBC 12.6* 11.1* 10.2 11.4* 10.5  NEUTROABS 7.7 6.2 5.5 6.3 5.2  HGB 11.3* 12.2* 12.4* 12.4* 11.6*  HCT 34.8* 37.3* 37.7* 37.6* 35.8*  MCV 88.1 87.4 86.5 86.0 86.1  PLT 373 410* 400 383 358  Cardiac Enzymes: No results for input(s): "CKTOTAL", "CKMB", "CKMBINDEX", "TROPONINI" in the last 168 hours. BNP (last 3 results) No results for input(s): "PROBNP" in the last 8760 hours. CBG: Recent Labs  Lab 12/08/22 0823 12/08/22 1129 12/08/22 1624 12/08/22 2109 12/09/22 0808  GLUCAP 137* 177* 177* 226* 183*    D-Dimer: No results for input(s): "DDIMER" in the last 72 hours. Hgb A1c: No results for input(s): "HGBA1C" in the last 72 hours. Lipid Profile: No results for input(s): "CHOL", "HDL", "LDLCALC", "TRIG", "CHOLHDL", "LDLDIRECT" in the last 72 hours. Thyroid function studies: No results for input(s): "TSH", "T4TOTAL", "T3FREE", "THYROIDAB" in the last 72 hours.  Invalid input(s): "FREET3" Anemia work up: No results for input(s): "VITAMINB12", "FOLATE", "FERRITIN", "TIBC", "IRON", "RETICCTPCT" in the last 72 hours. Sepsis Labs: Recent Labs  Lab 12/05/22 0307 12/06/22 0450 12/07/22 0448 12/08/22 0430  WBC 11.1* 10.2 11.4* 10.5    Microbiology Recent Results (from the past 240 hour(s))  C Difficile Quick Screen (NO  PCR Reflex)     Status: None   Collection Time: 12/05/22  5:15 PM   Specimen: STOOL  Result Value Ref Range Status   C Diff antigen NEGATIVE NEGATIVE Final   C Diff toxin NEGATIVE NEGATIVE Final   C Diff interpretation No C. difficile detected.  Final    Comment: Performed at [facility], [address], [place], [place] [address]     Medications:    cholestyramine light  2 g Oral BID   enoxaparin (LOVENOX) injection  40 mg Subcutaneous Q24H   feeding supplement  237 mL Oral TID BM   feeding supplement (OSMOLITE 1.5 CAL)  780 mL Per Tube Q24H   feeding supplement (PROSource TF20)  60 mL Per Tube Daily   fiber supplement (BANATROL TF)  60 mL Per Tube BID   free water  150 mL Per Tube Q8H   guaiFENesin  600 mg Oral BID   insulin aspart  0-5 Units Subcutaneous QHS   insulin aspart  0-9 Units Subcutaneous TID WC   insulin aspart  3 Units Subcutaneous TID WC   lipase/protease/amylase  72,000 Units Oral TID WC   pantoprazole  40 mg Oral Daily   sodium chloride  1 drop Both Eyes QHS   Continuous Infusions:  methocarbamol (ROBAXIN) IV     piperacillin-tazobactam (ZOSYN)  IV 3.375 g (12/08/22 2313)      LOS: 22 days   Marinda Elk  [facility]  12/09/2022, 8:29 AM

## 2022-12-10 DIAGNOSIS — K851 Biliary acute pancreatitis without necrosis or infection: Secondary | ICD-10-CM | POA: Diagnosis not present

## 2022-12-10 DIAGNOSIS — K219 Gastro-esophageal reflux disease without esophagitis: Secondary | ICD-10-CM | POA: Diagnosis not present

## 2022-12-10 DIAGNOSIS — I2510 Atherosclerotic heart disease of native coronary artery without angina pectoris: Secondary | ICD-10-CM | POA: Diagnosis not present

## 2022-12-10 DIAGNOSIS — K8051 Calculus of bile duct without cholangitis or cholecystitis with obstruction: Secondary | ICD-10-CM | POA: Diagnosis not present

## 2022-12-10 LAB — GLUCOSE, CAPILLARY: Glucose-Capillary: 140 mg/dL — ABNORMAL HIGH (ref 70–99)

## 2022-12-10 MED ORDER — PANCRELIPASE (LIP-PROT-AMYL) 36000-114000 UNITS PO CPEP: 72000.0000 [IU] | ORAL_CAPSULE | Freq: Three times a day (TID) | ORAL | 0 refills | Status: AC

## 2022-12-10 MED ORDER — CHOLESTYRAMINE LIGHT 4 G PO PACK: 2.0000 g | PACK | Freq: Two times a day (BID) | ORAL | 0 refills | Status: DC

## 2022-12-10 MED ORDER — OXYCODONE HCL 5 MG PO TABS: 5.0000 mg | ORAL_TABLET | Freq: Four times a day (QID) | ORAL | 0 refills | Status: AC | PRN

## 2022-12-10 MED ORDER — AMOXICILLIN-POT CLAVULANATE 875-125 MG PO TABS: 1.0000 | ORAL_TABLET | Freq: Two times a day (BID) | ORAL | 0 refills | Status: AC

## 2022-12-10 MED ORDER — ENSURE ENLIVE PO LIQD: 237.0000 mL | Freq: Three times a day (TID) | ORAL | 12 refills | Status: DC

## 2022-12-10 NOTE — Discharge Summary (Signed)
Physician Discharge Summary  Jon Bray ZOX:[id number] DOB: 01-27-1941 DOA: 11/17/2022  PCP: Danella Penton, MD  Admit date: 11/17/2022 Discharge date: 12/10/2022  Admitted From: Home Disposition:  Home  Recommendations for Outpatient Follow-up:  Follow-up with GI in 3 to 4 weeks with Dr. Meridee Score GI to arrange follow-up CT with pancreatic protocol early on the week of June 17. Will discharge on Augmentin twice a day for 7 days and as needed pain medications. Continue Ensure 3 times daily with meals Follow-up with urology outpatient for possible cystoscopy  Home Health: None Equipment/Devices: None Discharge Condition: Stable CODE STATUS: Full code Diet recommendation: Carb consistent diet  Brief/Interim Summary:  Jon Bray is an 82 y.o. male past medical history significant for diabetes mellitus type 2, essential hypertension, NSTEMI with stent in 2014, paroxysmal atrial fibrillation not on anticoagulation brought into [place] with complaint of worsening nausea vomiting.    In March saw his PCP for loss of appetite weight loss and elevate LFTs, on the 27th CT of the abdomen pelvis showed focal wall thickening of the urinary bladder with UVJ suspicious neoplasm, extensive diverticulosis.   09/30/2022 MRCP was done patient refused to go to the ED was referred as an outpatient to general surgery and GI.     On 10/12/2022 so general surgery recommended cholecystectomy but he refused as he wanted to wait.   11/16/2022 brought into the ED for nausea vomiting lipase of 10,000 MRI acute pancreatitis with choledocholithiasis with a distal CBD stone.  Started on IV fluids antibiotics and analgesics.  General surgery was consulted ERCP attempted but had to be aborted.  Follow-up MRCP showed persistent small stone in CBD, LFTs to be improving.  Underwent ERCP on 12/02/2022.  Acute gallstone pancreatitis with hemorrhage and necrosis with mass effect of the distal common bile duct: -Status post  lap chole 12/03/2022.   -Patient was started on tube feedings on 11/30/2022.  -Started on tube feeding.  On Zosyn, Creon 3 times a day & Prevalite 2 g twice a day. -His symptoms improved.  He was able to tolerate diet.  Feeding tube discontinued.  He will discharged home on Augmentin twice a day for 7 days and follow-up with GI and repeat CT pancreatic protocol outpatient to be arranged by GI   Basilar atelectasis: -Remained stable on room air   CAD/STEMI in 2014/hyperlipidemia: -Resumed home medication aspirin and statin   Paroxysmal atrial fibrillation: -Not on anticoagulation as an outpatient.   Diabetes mellitus type 2: -With an A1c of 5.9 -Diet controlled  GERD: -Continued PPI.   Urinary bladder wall thickening: -Suspicious for neoplasm. -On 11/24/2022 previous attending talked to Dr. Perley Jain via secure chat who recommended to follow-up cystoscopy as an outpatient.   Extensive diverticulosis/chronic constipation: -Continued MiraLAX daily,  Hyponatremia: Now resolved.   Anemia of chronic illness: Likely due to pancreatitis and acute cholecystitis. Hemoglobin has remained essentially stable. No signs of overt bleeding.   Hypokalemia: -Resolved   Hypoalbuminemia:  -Due to poor p.o. intake.  Tolerating diet now  Discharge Diagnoses:   Acute gallstone pancreatitis with hemorrhage and necrosis with mass effect of the distal common bile duct Basilar atelectasis Coronary artery disease Hyperlipidemia Paroxysmal A-fib Type 2 diabetes GERD Urinary bladder wall thickening Extensive diverticulosis/chronic constipation Hyponatremia Anemia of chronic disease Hypokalemia Hypoalbuminemia  Discharge Instructions  Discharge Instructions     Increase activity slowly   Complete by: As directed       Allergies as of 12/10/2022   No Known Allergies  Medication List     STOP taking these medications    cyanocobalamin 1000 MCG tablet Commonly known as:  VITAMIN B12   ferrous sulfate 325 (65 FE) MG tablet   Fish Oil 1000 MG Caps   HYDROcodone-acetaminophen 5-325 MG tablet Commonly known as: NORCO/VICODIN       TAKE these medications    amoxicillin-clavulanate 875-125 MG tablet Commonly known as: AUGMENTIN Take 1 tablet by mouth every 12 (twelve) hours for 7 days.   aspirin 81 MG chewable tablet Chew 81 mg by mouth daily.   bisacodyl 5 MG EC tablet Commonly known as: DULCOLAX Take 5 mg by mouth daily as needed for moderate constipation.   cholestyramine light 4 g packet Commonly known as: PREVALITE Take 0.5 packets (2 g total) by mouth 2 (two) times daily.   famotidine 40 MG tablet Commonly known as: PEPCID Take 40 mg by mouth at bedtime.   feeding supplement Liqd Take 237 mLs by mouth 3 (three) times daily between meals.   lipase/protease/amylase 78469 UNITS Cpep capsule Commonly known as: CREON Take 2 capsules (72,000 Units total) by mouth 3 (three) times daily with meals for 15 days.   multivitamin tablet Take 1 tablet by mouth daily.   pantoprazole 40 MG tablet Commonly known as: PROTONIX Take 40 mg by mouth daily.   PROBIOTIC ACIDOPHILUS PO Take 1 tablet by mouth daily.   sodium chloride 5 % ophthalmic ointment Commonly known as: MURO 128 Place 1 Application into both eyes at bedtime.   ZINC-220 PO Take 1 tablet by mouth daily.        Follow-up Information     [facility] Follow up.   Contact information: [address] [place] [address] [phone]        [facility] Follow up.   Contact information: [address] [place] [address] [phone]        [facility], Hedda Slade, [place]. Go on 12/23/2022.   Specialty: General Surgery Why: 2:45 PM, please arrive 30 min prior to appointment time Contact information: [address] [facility] [place]  [address] [phone]                No Known Allergies  Consultations: General surgery GI Urology   Procedures/Studies: DG CHEST PORT 1 VIEW  Result Date: 12/07/2022 CLINICAL DATA:  Shortness of breath. EXAM: PORTABLE CHEST 1 VIEW COMPARISON:  Chest x-ray Nov 26, 2022. FINDINGS: Enteric tube courses below the diaphragm with the tip outside the field of view. No consolidation. No visible pleural effusions or pneumothorax. Cardiomediastinal silhouette is within normal limits and similar. IMPRESSION: No active disease. Electronically Signed   By: Feliberto Harts M.D.   On: 12/07/2022 10:48   CT PANCREAS ABD W/WO  Result Date: 12/06/2022 CLINICAL DATA:  Necrotizing pancreatitis EXAM: CT ABDOMEN WITHOUT AND WITH CONTRAST TECHNIQUE: Multidetector CT imaging of the abdomen was performed following the standard protocol before and following the bolus administration of intravenous contrast. RADIATION DOSE REDUCTION: This exam was performed according to the departmental dose-optimization program which includes automated exposure control, adjustment of the mA and/or kV according to patient size and/or use of iterative reconstruction technique. CONTRAST:  75mL OMNIPAQUE IOHEXOL 350 MG/ML SOLN COMPARISON:  None Available. FINDINGS: Lower chest: No acute abnormality. Scarring or atelectasis of the left lung base. Coronary artery calcifications. Hepatobiliary: No focal liver abnormality is seen. Status post interval cholecystectomy. No biliary  dilatation. Pancreas: , With evidence of hypoenhancing parenchymal necrosis involving the ventral pancreatic head and uncinate (series 5, image 61, 68). Overlying fat necrosis and fluid measuring in aggregate 5.1 x 4.0 cm (series 5, image 68) as well as multiple small fluid collections situated about the pancreatic body and tail, largest about the tip of the pancreatic tail measuring 4.6 x 4.4 cm (series 5, image 39) and ventral pancreatic body and neck (series 5,  image 46). Spleen: Normal in size without significant abnormality. Adrenals/Urinary Tract: Adrenal glands are unremarkable. Kidneys are normal, without renal calculi, solid lesion, or hydronephrosis. Stomach/Bowel: Stomach is within normal limits. Non weighted enteric feeding tube, tip near the gastric antrum (series 5, image 51). No evidence of bowel wall thickening, distention, or inflammatory changes. Vascular/Lymphatic: Aortic atherosclerosis. No enlarged abdominal lymph nodes. Other: No abdominal wall hernia or abnormality. Minimal ascites. Small volume postoperative pneumoperitoneum. Musculoskeletal: No acute or significant osseous findings. IMPRESSION: 1. Similar appearance of the pancreas, specifically with evidence of hypoenhancing parenchymal necrosis involving the ventral pancreatic head and uncinate. Overlying fat necrosis and fluid as well as multiple small fluid collections situated about the pancreatic body and tail and ventral pancreatic body and neck. Findings are consistent with pancreatitis complicated by parenchymal necrosis as well as multiple organizing acute pancreatic fluid collections. Presence or absence of infection is not established by CT. 2. Status post interval cholecystectomy with small volume postoperative pneumoperitoneum. 3. A common bile duct calculus identified by prior MR is not appreciated on today's examination; presumably the common bile duct was interrogated by ERCP at the time of cholecystectomy. 4. Coronary artery disease. Aortic Atherosclerosis (ICD10-I70.0). Electronically Signed   By: Jearld Lesch M.D.   On: 12/06/2022 16:49   DG Abd Portable 1V  Result Date: 12/03/2022 CLINICAL DATA:  Feeding tube placement EXAM: PORTABLE ABDOMEN - 1 VIEW COMPARISON:  11/29/2022 FINDINGS: Weighted tip feeding tube placement, tip near the pylorus. Stomach decompressed. Cholecystectomy clips. Normal bowel gas pattern. Aortic Atherosclerosis (ICD10-170.0). IMPRESSION: Feeding tube  tip near the pylorus. Electronically Signed   By: Corlis Leak M.D.   On: 12/03/2022 15:17   DG ERCP  Result Date: 12/02/2022 CLINICAL DATA:  Gallstone pancreatitis. ERCP with sphincterotomy and balloon sweeps. EXAM: ERCP TECHNIQUE: Multiple spot images obtained with the fluoroscopic device and submitted for interpretation post-procedure. FLUOROSCOPY TIME: FLUOROSCOPY TIME 1 minute, 13 seconds (17.4 mGy) COMPARISON:  Abdominal MRI- 11/29/2022 FINDINGS: Three spot intraoperative fluoroscopic images of the right upper abdominal quadrant during ERCP are provided for review as well as an additional balloon cholangiogram. Initial image demonstrates an ERCP probe overlying the right upper abdominal quadrant. There is selective cannulation and opacification of the common bile duct with tapered narrowing at its distal aspect. Subsequent images demonstrate insufflation of a balloon within the central aspect of the CBD with subsequent biliary sweeping and balloon cholangiogram. There is opacification of the cystic duct with passage of contrast to the level of the gallbladder. Several filling defects are seen within the gallbladder lumen compatible with known cholelithiasis. There is minimal opacification of the intrahepatic biliary tree which appears nondilated. There are no discrete filling defects within opacified portion of the biliary tree to suggest choledocholithiasis. There is no definitive opacification of the pancreatic duct. IMPRESSION: ERCP with biliary sweeping as detailed above. These images were submitted for radiologic interpretation only. Please see the procedural report for the amount of contrast and the fluoroscopy time utilized. Electronically Signed   By: Simonne Come M.D.   On: 12/02/2022  12:35   DG Abd 1 View  Result Date: 11/29/2022 CLINICAL DATA:  NG tube placement. EXAM: ABDOMEN - 1 VIEW COMPARISON:  Nov 19, 2022 FINDINGS: Enteric catheter terminates in the expected location of the gastric  antrum. Limited visualization of the abdomen demonstrates nonspecific bowel gas pattern. IMPRESSION: Enteric catheter terminates in the expected location of the gastric antrum. Electronically Signed   By: Ted Mcalpine M.D.   On: 11/29/2022 15:48   MR ABDOMEN MRCP W WO CONTAST  Result Date: 11/29/2022 CLINICAL DATA:  82 year old male with history of epigastric pain. Suspected choledocholithiasis. EXAM: MRI ABDOMEN WITHOUT AND WITH CONTRAST (INCLUDING MRCP) TECHNIQUE: Multiplanar multisequence MR imaging of the abdomen was performed both before and after the administration of intravenous contrast. Heavily T2-weighted images of the biliary and pancreatic ducts were obtained, and three-dimensional MRCP images were rendered by post processing. CONTRAST:  7mL GADAVIST GADOBUTROL 1 MMOL/ML IV SOLN COMPARISON:  Abdominal MRI 11/22/2022. FINDINGS: Lower chest: Linear areas of increased signal intensity in the lower lobes of the lungs bilaterally, likely reflective of areas of subsegmental atelectasis, but poorly evaluated on today's magnetic resonance examination. Hepatobiliary: No suspicious cystic or solid hepatic lesions. No intrahepatic biliary ductal dilatation noted on MRCP images. Common bile duct measures 7 mm in the porta hepatis which is within normal limits given the patient's advanced age. The more distal common bile duct is markedly narrowed (best appreciated on coronal image 21 of series 3) as it traverses through the pancreatic head, likely secondary to extrinsic mass effect. Small filling defect in the distal common bile duct immediately above the ampulla (coronal image 20 of series 3) measuring 3 mm, again concerning for potential choledocholithiasis. Numerous small filling defects are also noted lying dependently in the gallbladder, compatible with gallstones. Gallbladder is moderately distended. However, the gallbladder wall thickness is normal. No pericholecystic fluid or surrounding  inflammatory changes. Pancreas: Again noted is a poorly defined mildly T2 hyperintense lesion in the head of the pancreas (axial image 27 of series 4) which is currently estimated to measure approximately 1.3 x 1.2 cm, and is associated with lack of enhancement on post gadolinium imaging (axial image 77 of series 12/04), suggesting an area of pancreatic necrosis. Pre gadolinium T1 weighted images also demonstrate areas of high T1 signal intensity in the inferior aspect of the pancreatic head, as well as the anterior aspect of the pancreatic body and tail, as well as in the surrounding soft tissues, indicative of areas of hemorrhage, along with probable proteinaceous/hemorrhagic debris. These regions also demonstrate associated areas of high T2 signal intensity, with peripheral rim enhancement on post gadolinium imaging, most evident adjacent to the inferior aspect of the pancreatic head extending into the root of the small bowel mesentery, and adjacent to the tail of the pancreas extending toward the splenic hilum, compatible with developing pancreatic pseudocysts. These exert mass effect upon adjacent structures, causing marked narrowing of the distal common bile duct, in addition to substantial narrowing of the superior mesenteric vein and splenoportal confluence (these veins remain patent at this time). Spleen:  Unremarkable. Adrenals/Urinary Tract: Bilateral kidneys and adrenal glands are normal in appearance. No hydroureteronephrosis in the visualized portions of the abdomen. Stomach/Bowel: Unremarkable. Vascular/Lymphatic: Aortic atherosclerosis. No aneurysm identified in the visualized abdominal vasculature. Marked narrowing of the superior mesenteric vein, distal splenic vein and splenoportal confluence secondary to mass effect from surrounding inflammation and developing pancreatic pseudocysts (discussed above). Other:  Trace volume of ascites. Musculoskeletal: No aggressive appearing osseous lesions are  noted in the visualized portions of the skeleton. IMPRESSION: 1. 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 2. Pancreatic inflammation exerts mass effect upon the distal common bile duct which is markedly narrowed. In addition, there continues to be a small 3 mm filling defect in the distal common bile duct immediately above the level of the ampulla suspicious for choledocholithiasis. Despite these findings, the common bile duct is within normal limits given the patient's age, and there is no intrahepatic biliary ductal dilatation to suggest clinically significant biliary obstruction at this time. 3. Cholelithiasis without evidence of acute cholecystitis at this time. 4. Aortic atherosclerosis. Electronically Signed   By: Trudie Reed M.D.   On: 11/29/2022 12:47   DG Chest 2 View  Result Date: 11/26/2022 CLINICAL DATA:  Shortness of breath. EXAM: CHEST - 2 VIEW COMPARISON:  11/21/2022 FINDINGS: Bibasilar chest densities with slightly improved aeration at the left lung base. Basilar chest densities are best appreciated on the lateral view. Upper lungs remain clear. Heart size is stable. Atherosclerotic calcifications at the aortic arch. IMPRESSION: 1. Bibasilar chest densities, left side greater than right. Findings could be associated with atelectasis/infection. 2. Slightly improved aeration at the left lung base compared to 11/21/2022. Electronically Signed   By: Richarda Overlie M.D.   On: 11/26/2022 14:46   MR ABDOMEN MRCP W WO CONTAST  Result Date: 11/23/2022 CLINICAL DATA:  Gallstone pancreatitis. EXAM: MRI ABDOMEN WITHOUT AND WITH CONTRAST (INCLUDING MRCP) TECHNIQUE: Multiplanar multisequence MR imaging of the abdomen was performed both before and after the administration of intravenous contrast. Heavily  T2-weighted images of the biliary and pancreatic ducts were obtained, and three-dimensional MRCP images were rendered by post processing. CONTRAST:  7mL GADAVIST GADOBUTROL 1 MMOL/ML IV SOLN COMPARISON:  11/16/2022 FINDINGS: Lower chest: Small bilateral pleural effusions associated with bibasilar collapse/consolidation. Hepatobiliary: No suspicious focal abnormality within the liver parenchyma. As on the prior study, there are numerous tiny dependent gallstones measuring in the 2-4 mm size range. 3 mm polypoid foci are seen along the mucosal surface of the gallbladder fundus, new in the interval. These may represent non dependent/buoyant stones or tiny cholesterol polyps. No gallbladder wall thickening. There is pericholecystic fluid with associated fluid around the inferior liver. Common bile duct diameter is again measured at 8 mm, upper normal for patient age. There is mass-effect on the distal common bile duct with gradual narrowing to a string sign approximately 18 mm proximal to the ampulla. Duct then distends mildly into the ampulla where a 3 mm intraductal stone is visible (coronal MRCP image 28 of series 12). Pancreas: There is diffuse pancreatic and peripancreatic edema and fluid. Interval development of a subtle 16 mm fluid collection in the head of the pancreas. No main duct dilatation. Pancreatic parenchyma enhances throughout after IV contrast administration. No focal or rim enhancing peripancreatic fluid collection. Spleen:  No splenomegaly. No focal mass lesion. Adrenals/Urinary Tract: No adrenal nodule or mass. Kidneys unremarkable. Stomach/Bowel: Stomach is unremarkable. No gastric wall thickening. No evidence of outlet obstruction. Duodenum is normally positioned as is the ligament of Treitz. No small bowel or colonic dilatation within the visualized abdomen. Vascular/Lymphatic: No abdominal aortic aneurysm. Portal vein, superior mesenteric vein, and splenic vein are patent. Celiac axis and SMA  are unremarkable. Other: Small volume free fluid is seen around the inferior liver and in both pericolic gutters. Musculoskeletal:  No focal suspicious marrow enhancement within the visualized bony anatomy. IMPRESSION: 1. Persistent diffuse pancreatic and peripancreatic edema and fluid compatible with acute pancreatitis. Interval development of a subtle 16 mm fluid collection in the head of the pancreas without rim enhancement. Evolving pseudocyst would be a consideration. No focal or rim enhancing peripancreatic fluid collection at this time. 2. Common bile duct again measures approximately 8 mm diameter, upper normal for patient age. Mass-effect on the distal common bile duct with gradual narrowing to a string sign approximately 18 mm proximal to the ampulla. Duct then distends mildly into the ampulla. 3 mm intraductal stone is visible just proximal to the ampulla. 3. Cholelithiasis. Pericholecystic fluid and fluid around the inferior liver . No gallbladder wall thickening. While these findings are nonspecific, the fluid is likely secondary to the pancreatitis. 4. Small bilateral pleural effusions associated with bibasilar collapse/consolidation. Electronically Signed   By: Kennith Center M.D.   On: 11/23/2022 05:49   DG C-Arm 1-60 Min-No Report  Result Date: 11/22/2022 Fluoroscopy was utilized by the requesting physician.  No radiographic interpretation.   DG CHEST PORT 1 VIEW  Result Date: 11/21/2022 CLINICAL DATA:  [id number] SOB (shortness of breath) [id number] EXAM: PORTABLE CHEST - 1 VIEW COMPARISON:  11/16/2022 FINDINGS: Bibasilar airspace opacities left greater than right slightly increased from previous. Heart size and mediastinal contours are within normal limits. Aortic Atherosclerosis (ICD10-170.0). No effusion. Visualized bones unremarkable. IMPRESSION: Slight increase in bibasilar infiltrates or atelectasis. Electronically Signed   By: Corlis Leak M.D.   On: 11/21/2022 11:25   DG Abd Portable  1V  Result Date: 11/19/2022 CLINICAL DATA:  Nausea vomiting EXAM: PORTABLE ABDOMEN - 1 VIEW COMPARISON:  03/09/2022, CT 11/16/2022 FINDINGS: Nonobstructed gas pattern with mild to moderate stool. No radiopaque calculi. CT demonstrated right UVJ stone not well seen radiographically. There are phleboliths in the pelvis. IMPRESSION: Negative. Electronically Signed   By: Jasmine Pang M.D.   On: 11/19/2022 15:26   MR ABDOMEN MRCP W WO CONTAST  Result Date: 11/17/2022 CLINICAL DATA:  82 year old male with history of cholecystitis. Abdominal pain and vomiting. EXAM: MRI ABDOMEN WITHOUT AND WITH CONTRAST (INCLUDING MRCP) TECHNIQUE: Multiplanar multisequence MR imaging of the abdomen was performed both before and after the administration of intravenous contrast. Heavily T2-weighted images of the biliary and pancreatic ducts were obtained, and three-dimensional MRCP images were rendered by post processing. CONTRAST:  7.49mL GADAVIST GADOBUTROL 1 MMOL/ML IV SOLN COMPARISON:  Abdominal MRI 09/30/2022. Contemporaneously obtained CT of the abdomen and pelvis 11/16/2022. FINDINGS: Lower chest: Cardiomegaly.  Small hiatal hernia. Hepatobiliary: No suspicious cystic or solid hepatic lesions are noted. Gallbladder is moderately distended. Gallbladder wall thickness is normal at 2 mm. No definite focal pericholecystic fluid. Multiple tiny filling defects lie dependently in the gallbladder, compatible with gallstones. MRCP images demonstrate no intrahepatic biliary ductal dilatation. Common bile duct is slightly prominent measuring 8 mm in the porta hepatis (within normal limits given the patient's age). However, in the distal common bile duct there is a 4 mm filling defect best appreciated on coronal MRCP image 15 of series 14), indicative of choledocholithiasis. Pancreas: Diffusely increased T2 signal intensity noted throughout the pancreatic parenchyma and in the adjacent fat of the retroperitoneum, indicative of acute  pancreatitis. Pancreatic parenchyma enhances normally. No well-defined pancreatic or peripancreatic fluid collections. No discrete pancreatic mass confidently identified. Spleen:  Unremarkable. Adrenals/Urinary Tract: Bilateral kidneys and bilateral adrenal glands are normal in appearance. No hydroureteronephrosis in the visualized portions of the abdomen. Stomach/Bowel:  Inflammatory changes are noted adjacent to the duodenum, presumably reflective of adjacent pancreatitis. Remaining visualized portions of stomach, small bowel and colon in the abdomen are otherwise unremarkable. Vascular/Lymphatic: No aneurysm identified in the visualized abdominal vasculature. No lymphadenopathy noted in the abdomen. Other:  Trace volume of ascites. Musculoskeletal: No aggressive appearing osseous lesions are noted in the visualized portions of the skeleton. IMPRESSION: 1. Acute interstitial pancreatitis without complicating features (i.e., no pancreatic necrosis or pseudocyst formation noted at this time). 2. Choledocholithiasis with 4 mm stone in the distal common bile duct. Common bile duct measures up to 8 mm which is within normal limits given the patient's advanced age. No intrahepatic biliary ductal dilatation to suggest frank biliary obstruction. There is also cholelithiasis with a moderately distended gallbladder, but gallbladder wall thickness is normal and there are no overt surrounding inflammatory changes to clearly indicate an acute cholecystitis at this time. 3. Cardiomegaly. 4. Small hiatal hernia. Electronically Signed   By: Trudie Reed M.D.   On: 11/17/2022 06:43   MR 3D Recon At Scanner  Result Date: 11/17/2022 CLINICAL DATA:  82 year old male with history of cholecystitis. Abdominal pain and vomiting. EXAM: MRI ABDOMEN WITHOUT AND WITH CONTRAST (INCLUDING MRCP) TECHNIQUE: Multiplanar multisequence MR imaging of the abdomen was performed both before and after the administration of intravenous contrast.  Heavily T2-weighted images of the biliary and pancreatic ducts were obtained, and three-dimensional MRCP images were rendered by post processing. CONTRAST:  7.85mL GADAVIST GADOBUTROL 1 MMOL/ML IV SOLN COMPARISON:  Abdominal MRI 09/30/2022. Contemporaneously obtained CT of the abdomen and pelvis 11/16/2022. FINDINGS: Lower chest: Cardiomegaly.  Small hiatal hernia. Hepatobiliary: No suspicious cystic or solid hepatic lesions are noted. Gallbladder is moderately distended. Gallbladder wall thickness is normal at 2 mm. No definite focal pericholecystic fluid. Multiple tiny filling defects lie dependently in the gallbladder, compatible with gallstones. MRCP images demonstrate no intrahepatic biliary ductal dilatation. Common bile duct is slightly prominent measuring 8 mm in the porta hepatis (within normal limits given the patient's age). However, in the distal common bile duct there is a 4 mm filling defect best appreciated on coronal MRCP image 15 of series 14), indicative of choledocholithiasis. Pancreas: Diffusely increased T2 signal intensity noted throughout the pancreatic parenchyma and in the adjacent fat of the retroperitoneum, indicative of acute pancreatitis. Pancreatic parenchyma enhances normally. No well-defined pancreatic or peripancreatic fluid collections. No discrete pancreatic mass confidently identified. Spleen:  Unremarkable. Adrenals/Urinary Tract: Bilateral kidneys and bilateral adrenal glands are normal in appearance. No hydroureteronephrosis in the visualized portions of the abdomen. Stomach/Bowel: Inflammatory changes are noted adjacent to the duodenum, presumably reflective of adjacent pancreatitis. Remaining visualized portions of stomach, small bowel and colon in the abdomen are otherwise unremarkable. Vascular/Lymphatic: No aneurysm identified in the visualized abdominal vasculature. No lymphadenopathy noted in the abdomen. Other:  Trace volume of ascites. Musculoskeletal: No aggressive  appearing osseous lesions are noted in the visualized portions of the skeleton. IMPRESSION: 1. Acute interstitial pancreatitis without complicating features (i.e., no pancreatic necrosis or pseudocyst formation noted at this time). 2. Choledocholithiasis with 4 mm stone in the distal common bile duct. Common bile duct measures up to 8 mm which is within normal limits given the patient's advanced age. No intrahepatic biliary ductal dilatation to suggest frank biliary obstruction. There is also cholelithiasis with a moderately distended gallbladder, but gallbladder wall thickness is normal and there are no overt surrounding inflammatory changes to clearly indicate an acute cholecystitis at this time. 3. Cardiomegaly. 4. Small hiatal hernia.  Electronically Signed   By: Trudie Reed M.D.   On: 11/17/2022 06:43   CT ABDOMEN PELVIS W CONTRAST  Result Date: 11/16/2022 CLINICAL DATA:  Acute abdominal pain EXAM: CT ABDOMEN AND PELVIS WITH CONTRAST TECHNIQUE: Multidetector CT imaging of the abdomen and pelvis was performed using the standard protocol following bolus administration of intravenous contrast. RADIATION DOSE REDUCTION: This exam was performed according to the departmental dose-optimization program which includes automated exposure control, adjustment of the mA and/or kV according to patient size and/or use of iterative reconstruction technique. CONTRAST:  OMNIPAQUE IOHEXOL 300 MG/ML  SOLN COMPARISON:  MRI abdomen 09/22/2022. CT abdomen and pelvis 09/29/2022 FINDINGS: Lower chest: There is some patchy ground-glass and airspace opacities in both lung bases. Hepatobiliary: No focal liver abnormality is seen. No gallstones, gallbladder wall thickening, or biliary dilatation. Pancreas: There is mild to moderate diffuse peripancreatic stranding and fluid compatible with acute pancreatitis. The pancreas enhances normally. No fluid collection or ductal dilatation. Spleen: Normal in size without focal  abnormality. Adrenals/Urinary Tract: There is a punctate calcification in the bladder at the level of the right ureterovesicular junction, similar to prior. Bladder is distended. There is no hydronephrosis or perinephric stranding. The adrenal glands are within normal limits. Stomach/Bowel: There is a small hiatal hernia. Stomach is within normal limits. Appendix appears normal. No evidence of bowel wall thickening, distention, or inflammatory changes. There is sigmoid colon diverticulosis. There is a large amount of stool throughout the colon. Vascular/Lymphatic: Aortic atherosclerosis. No enlarged abdominal or pelvic lymph nodes. Reproductive: Prostate is enlarged. Other: There is a small amount of fluid tracking along the retroperitoneum bilaterally secondary to pancreatitis. There is a small fat containing umbilical hernia. Musculoskeletal: There are degenerative changes at L2-L3, unchanged. IMPRESSION: 1. Findings compatible with acute pancreatitis. No evidence for pancreatic necrosis or fluid collection. 2. Stable punctate calcification in the bladder at the level of the right ureterovesicular junction. No hydronephrosis. 3. Patchy ground-glass and airspace opacities in both lung bases, likely infectious/inflammatory. 4. Colonic diverticulosis. 5. Prostatomegaly. Aortic Atherosclerosis (ICD10-I70.0). Electronically Signed   By: Darliss Cheney M.D.   On: 11/16/2022 23:43   DG Chest Portable 1 View  Result Date: 11/16/2022 CLINICAL DATA:  Shortness of breath. EXAM: PORTABLE CHEST 1 VIEW COMPARISON:  August 01, 2021 FINDINGS: The heart size and mediastinal contours are within normal limits. There is marked severity calcification of the aortic arch. Low lung volumes are noted. Mild, diffuse, chronic appearing increased interstitial lung markings are seen. Mild areas of atelectasis are seen within the bilateral lung bases, left greater than right. No pleural effusion or pneumothorax is identified. The  visualized skeletal structures are unremarkable. IMPRESSION: Low lung volumes with mild bibasilar atelectasis, left greater than right. Electronically Signed   By: Aram Candela M.D.   On: 11/16/2022 22:32   US ABDOMEN LIMITED RUQ (LIVER/GB)  Result Date: 11/16/2022 CLINICAL DATA:  Gallstone. EXAM: ULTRASOUND ABDOMEN LIMITED RIGHT UPPER QUADRANT COMPARISON:  None Available. FINDINGS: Gallbladder: Multiple mobile, shadowing echogenic gallstones are seen within the gallbladder lumen. The largest measures 1.1 cm. There is no evidence of gallbladder wall thickening (1.8 mm) no sonographic Murphy sign noted by sonographer. Common bile duct: Diameter: 6.5 mm Liver: No focal lesion identified. Within normal limits in parenchymal echogenicity. Portal vein is patent on color Doppler imaging with normal direction of blood flow towards the liver. Other: None. IMPRESSION: Cholelithiasis, without evidence of acute cholecystitis. Electronically Signed   By: Aram Candela M.D.   On: 11/16/2022 21:44  Subjective: Patient seen and examined.  Wife at the bedside.  Patient has tube feeding.  Reports that he is hungry and about to start his breakfast.  No abdominal pain, fever, chills, nausea or vomiting.  Patient and his wife wishes to go home today.  Discharge Exam: Vitals:   12/10/22 0457 12/10/22 0758  BP: 121/80 127/81  Pulse: 85 91  Resp: 18 16  Temp: 98.4 F (36.9 C) 98.3 F (36.8 C)  SpO2: 97% 97%   Vitals:   12/09/22 1624 12/10/22 0457 12/10/22 0500 12/10/22 0758  BP: 120/76 121/80  127/81  Pulse: 83 85  91  Resp: 16 18  16   Temp: 98.3 F (36.8 C) 98.4 F (36.9 C)  98.3 F (36.8 C)  TempSrc: Oral Oral  Oral  SpO2: 100% 97%  97%  Weight:   73.2 kg   Height:        General: Pt is alert, awake, not in acute distress, on room air, communicating well, wife at the bedside, has core track cardiovascular: RRR, S1/S2 +, no rubs, no gallops Respiratory: CTA bilaterally, no wheezing, no  rhonchi Abdominal: Soft, NT, ND, bowel sounds + Extremities: no edema, no cyanosis    The results of significant diagnostics from this hospitalization (including imaging, microbiology, ancillary and laboratory) are listed below for reference.     Microbiology: Recent Results (from the past 240 hour(s))  C Difficile Quick Screen (NO PCR Reflex)     Status: None   Collection Time: 12/05/22  5:15 PM   Specimen: STOOL  Result Value Ref Range Status   C Diff antigen NEGATIVE NEGATIVE Final   C Diff toxin NEGATIVE NEGATIVE Final   C Diff interpretation No C. difficile detected.  Final    Comment: Performed at [facility], [address], [place], [place] [address]     Labs: BNP (last 3 results) No results for input(s): "BNP" in the last 8760 hours. Basic Metabolic Panel: Recent Labs  Lab 12/04/22 0344 12/05/22 0307 12/06/22 0450 12/07/22 0448 12/08/22 0430  NA 134* 133* 134* 135 135  K 3.8 3.6 4.0 4.5 4.3  CL 100 99 97* 97* 98  CO2 25 24 24 25 27   GLUCOSE 230* 226* 234* 244* 191*  BUN 15 14 14 17 17   CREATININE 0.94 0.92 0.84 0.92 0.96  CALCIUM 8.1* 8.4* 8.3* 8.9 8.8*  MG 1.9 1.9 1.9 2.0 2.0  PHOS 3.0 2.7 2.8 3.4 3.7   Liver Function Tests: Recent Labs  Lab 12/04/22 0344 12/05/22 0307 12/06/22 0450 12/07/22 0448 12/08/22 0430  AST 46* 42* 33 31 30  ALT 41 42 39 36 35  ALKPHOS 54 61 66 68 73  BILITOT 0.6 0.7 0.5 0.5 0.5  PROT 6.3* 7.1 6.7 6.7 6.8  ALBUMIN 2.4* 2.6* 2.5* 2.6* 2.5*   No results for input(s): "LIPASE", "AMYLASE" in the last 168 hours. No results for input(s): "AMMONIA" in the last 168 hours. CBC: Recent Labs  Lab 12/04/22 0344 12/05/22 0307 12/06/22 0450 12/07/22 0448 12/08/22 0430  WBC 12.6* 11.1* 10.2 11.4* 10.5  NEUTROABS 7.7 6.2 5.5 6.3 5.2  HGB 11.3* 12.2* 12.4* 12.4* 11.6*  HCT 34.8* 37.3* 37.7* 37.6* 35.8*  MCV 88.1 87.4 86.5 86.0 86.1  PLT 373 410* 400 383 358   Cardiac Enzymes: No results for input(s): "CKTOTAL",  "CKMB", "CKMBINDEX", "TROPONINI" in the last 168 hours. BNP: Invalid input(s): "POCBNP" CBG: Recent Labs  Lab 12/09/22 0808 12/09/22 1227 12/09/22 1626 12/09/22 2132 12/10/22 0759  GLUCAP 183* 113* 129* 190* 140*   D-Dimer No results for input(s): "DDIMER" in the last 72 hours. Hgb A1c No results for input(s): "HGBA1C" in the last 72 hours. Lipid Profile No results for input(s): "CHOL", "HDL", "LDLCALC", "TRIG", "CHOLHDL", "LDLDIRECT" in the last 72 hours. Thyroid function studies No results for input(s): "TSH", "T4TOTAL", "T3FREE", "THYROIDAB" in the last 72 hours.  Invalid input(s): "FREET3" Anemia work up No results for input(s): "VITAMINB12", "FOLATE", "FERRITIN", "TIBC", "IRON", "RETICCTPCT" in the last 72 hours. Urinalysis    Component Value Date/Time   COLORURINE YELLOW (A) 11/16/2022 2355   APPEARANCEUR CLEAR (A) 11/16/2022 2355   APPEARANCEUR Clear 08/28/2021 1113   LABSPEC 1.016 11/16/2022 2355   PHURINE 7.0 11/16/2022 2355   GLUCOSEU NEGATIVE 11/16/2022 2355   HGBUR NEGATIVE 11/16/2022 2355   BILIRUBINUR NEGATIVE 11/16/2022 2355   BILIRUBINUR Negative 08/28/2021 1113   KETONESUR NEGATIVE 11/16/2022 2355   PROTEINUR 30 (A) 11/16/2022 2355   NITRITE NEGATIVE 11/16/2022 2355   LEUKOCYTESUR NEGATIVE 11/16/2022 2355   Sepsis Labs Recent Labs  Lab 12/05/22 0307 12/06/22 0450 12/07/22 0448 12/08/22 0430  WBC 11.1* 10.2 11.4* 10.5   Microbiology Recent Results (from the past 240 hour(s))  C Difficile Quick Screen (NO PCR Reflex)     Status: None   Collection Time: 12/05/22  5:15 PM   Specimen: STOOL  Result Value Ref Range Status   C Diff antigen NEGATIVE NEGATIVE Final   C Diff toxin NEGATIVE NEGATIVE Final   C Diff interpretation No C. difficile detected.  Final    Comment: Performed at [facility], [address], [place], [place] [address]     Time coordinating discharge: Over 30 minutes  SIGNED:   Ollen Bowl, MD  [facility] 12/10/2022, 11:20 AM Pager   If 7PM-7AM, please contact night-coverage [email or web address]

## 2022-12-10 NOTE — Progress Notes (Signed)
Calorie Count Note - Day 2 Results   48 hour calorie count ordered.  Diet: Soft Supplements: Ensure Enlive - TID, Magic Cup - BID  Estimated Nutritional Needs:  Kcal:  1900-2100 Protein:  95-115 grams Fluid:  >/= 1.9 L  6/6 Breakfast: 1/2 oatmeal, 1/4 banana, 1/2 2% milk, 1 bowl rice krispies, 1 grape jelly (310 kcal, 12 gm protein) 6/6 Lunch: 1/2 Egg salad, 1/4 baked chips (217 kcal, 8 gm protein) 6/6 Dinner: 1/2 ham sandwich, 1/2 vanilla pudding (146 kcal, 6.5 gm protein) Supplements: 3/4 Ensure Max, 3/4 Glucerna, 3/4 Magic Cup (383 kcal, 37 gm protein)  Total intake: 1056 kcal (56% of minimum estimated needs)  63.5 protein (67% of minimum estimated needs)  Nutrition Diagnosis: Inadequate oral intake related to acute illness as evidenced by per patient/family report   Intervention:  Tube feeds via NG-tube: Osmolite 1.5 at 65 mL/hr x 12 from 1800 to 0600 (780 mL per day) 60 mL ProSource TF20 - Daily Free water flush: 150 mL q4h Tube feeds at goal provides 1250 kcal, 69 gm protein, and 1450 mL total free water daily.  Ensure Enlive po TID, each supplement provides 350 kcal and 20 grams of protein. Magic cup BID with meals, each supplement provides 290 kcal and 9 grams of protein Banatrol  TF - BID Calorie Count    Kirby Crigler RD, LDN Clinical Dietitian See [facility] for contact information.

## 2022-12-10 NOTE — TOC Transition Note (Signed)
Transition of Care [facility]) - CM/SW Discharge Note   Patient Details  Name: Jon Bray MRN: [id number] Date of Birth: [birth year]  Transition of [facility]) CM/SW Contact:  Kermit Balo, RN Phone Number: 12/10/2022, 12:10 PM   Clinical Narrative:    Pt is discharging home with self care. No needs per TOC. Pt has transportation home.   Final next level of care: Home/Self Care Barriers to Discharge: No Barriers Identified   Patient Goals and CMS Choice      Discharge Placement                         Discharge Plan and Services Additional resources added to the After Visit Summary for                                       Social Determinants of Health (SDOH) Interventions SDOH Screenings   Food Insecurity: No Food Insecurity (11/17/2022)  Housing: Low Risk  (11/17/2022)  Transportation Needs: No Transportation Needs (11/17/2022)  Utilities: Not At Risk (11/17/2022)  Depression (PHQ2-9): Low Risk  (11/17/2022)  Financial Resource Strain: Low Risk  (11/17/2022)  Physical Activity: Sufficiently Active (11/17/2022)  Social Connections: Socially Integrated (11/17/2022)  Stress: No Stress Concern Present (11/17/2022)  Tobacco Use: Low Risk  (12/06/2022)     Readmission Risk Interventions     No data to display

## 2022-12-10 NOTE — Progress Notes (Addendum)
Patient ID: Jon Bray, male   DOB: 06-Dec-1940, 82 y.o.   MRN: [id number]    Progress Note   Subjective  Day # 23 CC; gallstone pancreatitis, history of retained biliary stone, initial failed ERCP, then repeat ERCP with biliary sphincterotomy stone extraction, laparoscopic cholecystectomy and prolonged course secondary to necrotizing pancreatitis.  Patient continues to gradually improve working hard to improve oral intake-did not meet his dietary needs yesterday.  Has been receiving nocturnal tube feedings over the past few days.  He is anxious to be discharged as that he will be able to increase his p.o. intake at home. Diarrhea has improved-fecal elastase was normal Continues on Prevalite and Creon  No new labs today   Objective   Vital signs in last 24 hours: Temp:  [98.3 F (36.8 C)-98.4 F (36.9 C)] 98.3 F (36.8 C) (06/07 0758) Pulse Rate:  [83-91] 91 (06/07 0758) Resp:  [16-18] 16 (06/07 0758) BP: (120-127)/(76-81) 127/81 (06/07 0758) SpO2:  [97 %-100 %] 97 % (06/07 0758) Weight:  [73.2 kg] 73.2 kg (06/07 0500) Last BM Date : 12/09/22 General: Elderly white male in NAD Heart:  Regular rate and rhythm; no murmurs Lungs: Respirations even and unlabored, lungs CTA bilaterally Abdomen:  Soft, some tenderness in the upper abdomen, and superficially very tender around his incisional ports, nondistended. Normal bowel sounds. Extremities:  Without edema. Neurologic:  Alert and oriented,  grossly normal neurologically. Psych:  Cooperative. Normal mood and affect.  Intake/Output from previous day: No intake/output data recorded. Intake/Output this shift: Total I/O In: 240 [P.O.:240] Out: -   Lab Results: Recent Labs    12/08/22 0430  WBC 10.5  HGB 11.6*  HCT 35.8*  PLT 358   BMET Recent Labs    12/08/22 0430  NA 135  K 4.3  CL 98  CO2 27  GLUCOSE 191*  BUN 17  CREATININE 0.96  CALCIUM 8.8*   LFT Recent Labs    12/08/22 0430  PROT 6.8  ALBUMIN 2.5*   AST 30  ALT 35  ALKPHOS 73  BILITOT 0.5   PT/INR No results for input(s): "LABPROT", "INR" in the last 72 hours.  Studies/Results: No results found.     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  Long somewhat complicated course due to severe pancreatitis, necrotizing gallstone induced He has successfully undergone ERCP and stone extraction 12/02/2022 S/P post laparoscopic cholecystectomy 12/03/2022  Repeat imaging earlier this week with evidence of hypoenhancing parenchymal necrosis involving the ventral pancreatic head and uncinate process and multiple small fluid collections situated around the pancreatic body and tail the largest 4.6 x 4.4 at the tail, no abscess  He has not had much of an appetite, had been receiving tube feeds, converted to nocturnal tube feeds earlier this week and he is doing well trying to increase oral intake and also taking supplements.  Antibiotics converted to oral/Augmentin yesterday plan for 7-day course as outpatient  Plan; okay for discharge today from GI perspective Discontinue nocturnal tube feeds Continue the current protein supplements orally that he has been receiving here Discharge on Augmentin 875 p.o. twice daily with food x 7 days Oral analgesics as needed will need short-term hydrocodone  Our office is arranging follow-up CT with pancreatic protocol early on the week of June 17. We are also arranging outpatient  follow-up in 3 to 4 weeks/Dr. Mansouraty .Patient and wife advised to call for any problems in the interim.    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 I performed a substantive portion of this encounter, including complete performance of at least one of the key components, in conjunction with the APP. I agree with the APP's note, impression and recommendations.   Okay to discharge from GI standpoint Continue oral Augmentin for 7 days Diet as tolerated with high-protein, encourage patient to eat small frequent meals to improve his calorie intake He has follow-up appointment for CT pancreas protocol on June 17 and office follow-up visit with Dr. Meridee Score  The patient was provided an opportunity to ask questions and all were answered. The patient agreed with the plan and demonstrated an understanding of the instructions.   Iona Beard , MD [phone]

## 2022-12-13 ENCOUNTER — Encounter: Payer: Self-pay | Admitting: Internal Medicine

## 2022-12-13 ENCOUNTER — Telehealth: Payer: Self-pay | Admitting: Internal Medicine

## 2022-12-13 NOTE — Telephone Encounter (Signed)
Jon Bray, looks like this should go to you. Dr. Meridee Score did last procedure and Dr. Lavon Paganini has recommended repeat imaging and follow up with Dr. Meridee Score. Thanks  I have taken a history, reviewed the chart and examined the patient. I performed a substantive portion of this encounter, including complete performance of at least one of the key components, in conjunction with the APP. I agree with the APP's note, impression and recommendations.    Okay to discharge from GI standpoint Continue oral Augmentin for 7 days Diet as tolerated with high-protein, encourage patient to eat small frequent meals to improve his calorie intake He has follow-up appointment for CT pancreas protocol on June 17 and office follow-up visit with Dr. Meridee Score   The patient was provided an opportunity to ask questions and all were answered. The patient agreed with the plan and demonstrated an understanding of the instructions.     Iona Beard , MD [phone]

## 2022-12-13 NOTE — Telephone Encounter (Signed)
Inbound call from patient spouse. States she spoke with PA Amy esterwood in regards to patient being scheduled for an MRI. Please advise.   Thank you

## 2022-12-13 NOTE — Telephone Encounter (Signed)
Dr Meridee Score please review CT scan and advise what the next steps are.

## 2022-12-13 NOTE — Telephone Encounter (Signed)
This pt was first seen by Dr Myrtie Neither while admitted. See consult note. Will send to his nurse [place].

## 2022-12-13 NOTE — Telephone Encounter (Signed)
Dr Lavon Paganini your note says the pt is set up for CT on 6/17.  I do not see that set up anywhere was it set up outside the [facility] system?

## 2022-12-13 NOTE — Telephone Encounter (Signed)
Appt made to see Dr Meridee Score on 02/22/23 at 230 pm. Waiting to see where CT has been scheduled.

## 2022-12-13 NOTE — Telephone Encounter (Signed)
Will await the updated CT scan as had been recommend 2-weeks from prior (order should be in place if not please place and get set up around that time if a few days after that is OK too). Needs clinic visit setup for after imaging completed (APP or myself). Thanks. GM

## 2022-12-14 ENCOUNTER — Other Ambulatory Visit: Payer: Self-pay

## 2022-12-14 DIAGNOSIS — K8591 Acute pancreatitis with uninfected necrosis, unspecified: Secondary | ICD-10-CM

## 2022-12-14 NOTE — Telephone Encounter (Signed)
Patty, please check if he has a CT scheduled at [facility] if not he will need CT scheduled for next week.  Thank you

## 2022-12-14 NOTE — Telephone Encounter (Signed)
The pt has been advised of the follow up appt.  He is going to see if he has an appt for 6/17 for CT and call back and let me know. If not we will get him set up.

## 2022-12-14 NOTE — Telephone Encounter (Signed)
The pt has not been set up for CT scan.  Order entered and sent to the schedulers.  The pt has been advised to expect a call

## 2022-12-16 NOTE — Telephone Encounter (Addendum)
Inbound call from patients wife stating that patient has been scheduled for his CT scan on 6/21.  Patient wife was told to call our office to see if he is supposed to drink 2 bottles of contrast before the CT.  Requesting a call back to discuss. Please advise.

## 2022-12-16 NOTE — Telephone Encounter (Signed)
I spoke with the pt wife and advised that she will follow the instructions per radiology regarding the contrast.  She states she was told to come in 2 hours early to drink contrast and she wanted to make sure that was correct.  All questions answered to the best of my ability and she will call with any other concerns.

## 2022-12-22 DIAGNOSIS — E440 Moderate protein-calorie malnutrition: Secondary | ICD-10-CM | POA: Diagnosis not present

## 2022-12-22 DIAGNOSIS — E1151 Type 2 diabetes mellitus with diabetic peripheral angiopathy without gangrene: Secondary | ICD-10-CM | POA: Diagnosis not present

## 2022-12-22 DIAGNOSIS — K8591 Acute pancreatitis with uninfected necrosis, unspecified: Secondary | ICD-10-CM | POA: Diagnosis not present

## 2022-12-24 ENCOUNTER — Ambulatory Visit
Admission: RE | Admit: 2022-12-24 | Discharge: 2022-12-24 | Disposition: A | Discharge status: Home / Self Care | Payer: HMO | Source: Ambulatory Visit | Attending: Gastroenterology | Admitting: Gastroenterology

## 2022-12-24 DIAGNOSIS — K8591 Acute pancreatitis with uninfected necrosis, unspecified: Secondary | ICD-10-CM | POA: Diagnosis not present

## 2022-12-24 DIAGNOSIS — K8689 Other specified diseases of pancreas: Secondary | ICD-10-CM | POA: Diagnosis not present

## 2022-12-24 MED ORDER — IOHEXOL 9 MG/ML PO SOLN: 1000.0000 mL | ORAL | Status: AC

## 2022-12-24 MED ORDER — IOHEXOL 300 MG/ML  SOLN
100.0000 mL | Freq: Once | INTRAMUSCULAR | Status: AC | PRN
  Administered 2022-12-24: 100 mL via INTRAVENOUS

## 2022-12-27 ENCOUNTER — Telehealth: Payer: Self-pay | Admitting: Internal Medicine

## 2022-12-27 NOTE — Telephone Encounter (Signed)
Patient calling I  regards to results. Please advise

## 2022-12-27 NOTE — Telephone Encounter (Signed)
Patient calls requesting results of his CT scan completed 12/24/22. I have advised that unfortunately, this scan has not resulted. As soon as it becomes available and Dr Meridee Score reviews, we will be in touch with him. He verbalizes understanding.

## 2022-12-30 DIAGNOSIS — K8591 Acute pancreatitis with uninfected necrosis, unspecified: Secondary | ICD-10-CM | POA: Diagnosis not present

## 2022-12-30 DIAGNOSIS — E440 Moderate protein-calorie malnutrition: Secondary | ICD-10-CM | POA: Diagnosis not present

## 2022-12-30 DIAGNOSIS — E1151 Type 2 diabetes mellitus with diabetic peripheral angiopathy without gangrene: Secondary | ICD-10-CM | POA: Diagnosis not present

## 2023-01-05 ENCOUNTER — Other Ambulatory Visit: Payer: Self-pay

## 2023-01-05 DIAGNOSIS — K8591 Acute pancreatitis with uninfected necrosis, unspecified: Secondary | ICD-10-CM

## 2023-01-11 DIAGNOSIS — M9901 Segmental and somatic dysfunction of cervical region: Secondary | ICD-10-CM | POA: Diagnosis not present

## 2023-01-11 DIAGNOSIS — M62830 Muscle spasm of back: Secondary | ICD-10-CM | POA: Diagnosis not present

## 2023-01-11 DIAGNOSIS — M9903 Segmental and somatic dysfunction of lumbar region: Secondary | ICD-10-CM | POA: Diagnosis not present

## 2023-01-11 DIAGNOSIS — M5136 Other intervertebral disc degeneration, lumbar region: Secondary | ICD-10-CM | POA: Diagnosis not present

## 2023-01-14 DIAGNOSIS — M5136 Other intervertebral disc degeneration, lumbar region: Secondary | ICD-10-CM | POA: Diagnosis not present

## 2023-01-14 DIAGNOSIS — M9901 Segmental and somatic dysfunction of cervical region: Secondary | ICD-10-CM | POA: Diagnosis not present

## 2023-01-14 DIAGNOSIS — M9903 Segmental and somatic dysfunction of lumbar region: Secondary | ICD-10-CM | POA: Diagnosis not present

## 2023-01-14 DIAGNOSIS — M62830 Muscle spasm of back: Secondary | ICD-10-CM | POA: Diagnosis not present

## 2023-01-18 DIAGNOSIS — E1151 Type 2 diabetes mellitus with diabetic peripheral angiopathy without gangrene: Secondary | ICD-10-CM | POA: Diagnosis not present

## 2023-01-18 DIAGNOSIS — Z125 Encounter for screening for malignant neoplasm of prostate: Secondary | ICD-10-CM | POA: Diagnosis not present

## 2023-01-18 NOTE — Telephone Encounter (Signed)
Inbound call from patient wife states patient had a pain episode last Saturday, Wednesday and last night as well.  Requesting to speak with a nurse in regards to patient health update. Please advise.

## 2023-01-18 NOTE — Telephone Encounter (Signed)
I spoke with the Jon Bray and he tells me that he has upper abd pain that goes across his abdomen around his ribs about every 2-3 days that last for 12 hours. He has been on a low fat diet for weeks.  He does not notice any relation to food.  He says the pain is becoming more frequent.  He is sore to touch.  No nausea, bowels are normal for him and actually better than ever.  He is very active and thinks things are getting back to normal then another episode comes.  He was in the hospital for acute pancreatitis, gallstones (gallbladder removal in May) ERCP done in May as well.  He is on a liquid diet as of today and is coming in for recommended labs.  Please advice any further recommendations

## 2023-01-19 ENCOUNTER — Other Ambulatory Visit: Payer: Self-pay

## 2023-01-19 ENCOUNTER — Ambulatory Visit (INDEPENDENT_AMBULATORY_CARE_PROVIDER_SITE_OTHER): Payer: HMO

## 2023-01-19 ENCOUNTER — Encounter: Payer: Self-pay | Admitting: Gastroenterology

## 2023-01-19 ENCOUNTER — Other Ambulatory Visit (INDEPENDENT_AMBULATORY_CARE_PROVIDER_SITE_OTHER): Payer: HMO

## 2023-01-19 DIAGNOSIS — K8591 Acute pancreatitis with uninfected necrosis, unspecified: Secondary | ICD-10-CM

## 2023-01-19 DIAGNOSIS — M5136 Other intervertebral disc degeneration, lumbar region: Secondary | ICD-10-CM | POA: Diagnosis not present

## 2023-01-19 DIAGNOSIS — M9903 Segmental and somatic dysfunction of lumbar region: Secondary | ICD-10-CM | POA: Diagnosis not present

## 2023-01-19 DIAGNOSIS — M62830 Muscle spasm of back: Secondary | ICD-10-CM | POA: Diagnosis not present

## 2023-01-19 DIAGNOSIS — M9901 Segmental and somatic dysfunction of cervical region: Secondary | ICD-10-CM | POA: Diagnosis not present

## 2023-01-19 LAB — CBC WITH DIFFERENTIAL/PLATELET
Basophils Absolute: 0.1 10*3/uL (ref 0.0–0.1)
Basophils Relative: 0.8 % (ref 0.0–3.0)
Eosinophils Absolute: 0.4 10*3/uL (ref 0.0–0.7)
Eosinophils Relative: 3.9 % (ref 0.0–5.0)
HCT: 38 % — ABNORMAL LOW (ref 39.0–52.0)
Hemoglobin: 12.4 g/dL — ABNORMAL LOW (ref 13.0–17.0)
Lymphocytes Relative: 41.4 % (ref 12.0–46.0)
Lymphs Abs: 3.8 10*3/uL (ref 0.7–4.0)
MCHC: 32.5 g/dL (ref 30.0–36.0)
MCV: 86.9 fl (ref 78.0–100.0)
Monocytes Absolute: 0.7 10*3/uL (ref 0.1–1.0)
Monocytes Relative: 7.1 % (ref 3.0–12.0)
Neutro Abs: 4.3 10*3/uL (ref 1.4–7.7)
Neutrophils Relative %: 46.8 % (ref 43.0–77.0)
Platelets: 319 10*3/uL (ref 150.0–400.0)
RBC: 4.38 Mil/uL (ref 4.22–5.81)
RDW: 15 % (ref 11.5–15.5)
WBC: 9.2 10*3/uL (ref 4.0–10.5)

## 2023-01-19 LAB — HIGH SENSITIVITY CRP: CRP, High Sensitivity: 6.78 mg/L — ABNORMAL HIGH (ref 0.000–5.000)

## 2023-01-19 LAB — COMPREHENSIVE METABOLIC PANEL
ALT: 53 U/L (ref 0–53)
AST: 38 U/L — ABNORMAL HIGH (ref 0–37)
Albumin: 3.7 g/dL (ref 3.5–5.2)
Alkaline Phosphatase: 84 U/L (ref 39–117)
BUN: 27 mg/dL — ABNORMAL HIGH (ref 6–23)
CO2: 24 mEq/L (ref 19–32)
Calcium: 9.1 mg/dL (ref 8.4–10.5)
Chloride: 106 mEq/L (ref 96–112)
Creatinine, Ser: 0.91 mg/dL (ref 0.40–1.50)
GFR: 78.11 mL/min (ref >60.00)
Glucose, Bld: 100 mg/dL — ABNORMAL HIGH (ref 70–99)
Potassium: 4 mEq/L (ref 3.5–5.1)
Sodium: 140 mEq/L (ref 135–145)
Total Bilirubin: 0.8 mg/dL (ref 0.2–1.2)
Total Protein: 7.4 g/dL (ref 6.0–8.3)

## 2023-01-19 LAB — IBC + FERRITIN
Ferritin: 600.9 ng/mL — ABNORMAL HIGH (ref 22.0–322.0)
Iron: 51 ug/dL (ref 42–165)
Saturation Ratios: 16.4 % — ABNORMAL LOW (ref 20.0–50.0)
TIBC: 310.8 ug/dL (ref 250.0–450.0)
Transferrin: 222 mg/dL (ref 212.0–360.0)

## 2023-01-19 LAB — SEDIMENTATION RATE: Sed Rate: 84 mm/hr — ABNORMAL HIGH (ref 0–20)

## 2023-01-19 LAB — FOLATE: Folate: 11.8 ng/mL (ref >5.9)

## 2023-01-19 LAB — VITAMIN B12: Vitamin B-12: 472 pg/mL (ref 211–911)

## 2023-01-19 LAB — LIPASE: Lipase: 300 U/L — ABNORMAL HIGH (ref 11.0–59.0)

## 2023-01-19 NOTE — Telephone Encounter (Signed)
Add on labs added and request faxed to lab

## 2023-01-19 NOTE — Telephone Encounter (Signed)
Patty, Lets see what his laboratories show, it looks like he is having them done today or having had them already done. Please see if you can add on an ESR or CRP to the labs if possible. We may consider repeat cross-sectional imaging, but for now lets see the laboratories first. Follow-up with him tomorrow to see where things stand. GM

## 2023-01-20 ENCOUNTER — Other Ambulatory Visit: Payer: Self-pay

## 2023-01-20 DIAGNOSIS — K8591 Acute pancreatitis with uninfected necrosis, unspecified: Secondary | ICD-10-CM

## 2023-01-24 ENCOUNTER — Ambulatory Visit
Admission: RE | Admit: 2023-01-24 | Discharge: 2023-01-24 | Disposition: A | Discharge status: Home / Self Care | Payer: HMO | Source: Ambulatory Visit | Attending: Gastroenterology | Admitting: Gastroenterology

## 2023-01-24 DIAGNOSIS — K8591 Acute pancreatitis with uninfected necrosis, unspecified: Secondary | ICD-10-CM | POA: Diagnosis not present

## 2023-01-24 DIAGNOSIS — K5730 Diverticulosis of large intestine without perforation or abscess without bleeding: Secondary | ICD-10-CM | POA: Diagnosis not present

## 2023-01-24 DIAGNOSIS — M5136 Other intervertebral disc degeneration, lumbar region: Secondary | ICD-10-CM | POA: Diagnosis not present

## 2023-01-24 DIAGNOSIS — M62830 Muscle spasm of back: Secondary | ICD-10-CM | POA: Diagnosis not present

## 2023-01-24 DIAGNOSIS — M9903 Segmental and somatic dysfunction of lumbar region: Secondary | ICD-10-CM | POA: Diagnosis not present

## 2023-01-24 DIAGNOSIS — M9901 Segmental and somatic dysfunction of cervical region: Secondary | ICD-10-CM | POA: Diagnosis not present

## 2023-01-24 MED ORDER — IOHEXOL 300 MG/ML  SOLN
100.0000 mL | Freq: Once | INTRAMUSCULAR | Status: AC | PRN
  Administered 2023-01-24: 100 mL via INTRAVENOUS

## 2023-01-25 DIAGNOSIS — Z0000 Encounter for general adult medical examination without abnormal findings: Secondary | ICD-10-CM | POA: Diagnosis not present

## 2023-01-25 DIAGNOSIS — E440 Moderate protein-calorie malnutrition: Secondary | ICD-10-CM | POA: Diagnosis not present

## 2023-01-25 DIAGNOSIS — K8591 Acute pancreatitis with uninfected necrosis, unspecified: Secondary | ICD-10-CM | POA: Diagnosis not present

## 2023-01-25 DIAGNOSIS — E1151 Type 2 diabetes mellitus with diabetic peripheral angiopathy without gangrene: Secondary | ICD-10-CM | POA: Diagnosis not present

## 2023-01-25 DIAGNOSIS — M5116 Intervertebral disc disorders with radiculopathy, lumbar region: Secondary | ICD-10-CM | POA: Diagnosis not present

## 2023-01-26 ENCOUNTER — Telehealth: Payer: Self-pay | Admitting: Internal Medicine

## 2023-01-26 ENCOUNTER — Telehealth: Payer: Self-pay | Admitting: Gastroenterology

## 2023-01-26 ENCOUNTER — Other Ambulatory Visit (INDEPENDENT_AMBULATORY_CARE_PROVIDER_SITE_OTHER): Payer: HMO

## 2023-01-26 DIAGNOSIS — K8591 Acute pancreatitis with uninfected necrosis, unspecified: Secondary | ICD-10-CM

## 2023-01-26 LAB — LIPASE: Lipase: 1258 U/L — ABNORMAL HIGH (ref 11.0–59.0)

## 2023-01-26 LAB — COMPREHENSIVE METABOLIC PANEL
ALT: 49 U/L (ref 0–53)
AST: 36 U/L (ref 0–37)
Albumin: 3.7 g/dL (ref 3.5–5.2)
Alkaline Phosphatase: 68 U/L (ref 39–117)
BUN: 23 mg/dL (ref 6–23)
CO2: 25 mEq/L (ref 19–32)
Calcium: 9 mg/dL (ref 8.4–10.5)
Chloride: 106 mEq/L (ref 96–112)
Creatinine, Ser: 0.89 mg/dL (ref 0.40–1.50)
GFR: 79.41 mL/min (ref >60.00)
Glucose, Bld: 111 mg/dL — ABNORMAL HIGH (ref 70–99)
Potassium: 4.1 mEq/L (ref 3.5–5.1)
Sodium: 140 mEq/L (ref 135–145)
Total Bilirubin: 0.8 mg/dL (ref 0.2–1.2)
Total Protein: 7.1 g/dL (ref 6.0–8.3)

## 2023-01-26 LAB — CBC WITH DIFFERENTIAL/PLATELET
Basophils Absolute: 0 10*3/uL (ref 0.0–0.1)
Basophils Relative: 0.5 % (ref 0.0–3.0)
Eosinophils Absolute: 0.2 10*3/uL (ref 0.0–0.7)
Eosinophils Relative: 2.3 % (ref 0.0–5.0)
HCT: 38.1 % — ABNORMAL LOW (ref 39.0–52.0)
Hemoglobin: 12.3 g/dL — ABNORMAL LOW (ref 13.0–17.0)
Lymphocytes Relative: 24.8 % (ref 12.0–46.0)
Lymphs Abs: 2.4 10*3/uL (ref 0.7–4.0)
MCHC: 32.3 g/dL (ref 30.0–36.0)
MCV: 86.5 fl (ref 78.0–100.0)
Monocytes Absolute: 0.6 10*3/uL (ref 0.1–1.0)
Monocytes Relative: 6 % (ref 3.0–12.0)
Neutro Abs: 6.3 10*3/uL (ref 1.4–7.7)
Neutrophils Relative %: 66.4 % (ref 43.0–77.0)
Platelets: 266 10*3/uL (ref 150.0–400.0)
RBC: 4.4 Mil/uL (ref 4.22–5.81)
RDW: 15.3 % (ref 11.5–15.5)
WBC: 9.5 10*3/uL (ref 4.0–10.5)

## 2023-01-26 LAB — AMYLASE: Amylase: 902 U/L — ABNORMAL HIGH (ref 27–131)

## 2023-01-26 LAB — SEDIMENTATION RATE: Sed Rate: 52 mm/hr — ABNORMAL HIGH (ref 0–20)

## 2023-01-26 LAB — C-REACTIVE PROTEIN: CRP: <1 mg/dL (ref 0.5–20.0)

## 2023-01-26 NOTE — Telephone Encounter (Signed)
Patient and wife called. He has been having intermittent flares of his pancreatitis. Labs today ok except significant elevation of amylase and lipase.  Lab review and call from GM below:  "I called and spoke with the patient and patient's wife this afternoon.  I have reviewed the laboratories which show that inflammatory markers are slightly improved but his amylase and lipase are through the roof his lipase is the highest it has been at 1258.  His blood counts are normal and his hemoglobin is stable.  No kidney dysfunction or liver biochemical testing abnormalities. When I called to speak with them they said that he had been doing okay but saw his PCP yesterday and was feeling worse and over the course of today has progressively made much worse.  He is not taking any pain medicine as of yet but he is in bed resting currently.  I asked him to go ahead and take some of the opioid therapy oxycodone that they had, I was going to prescribe some but they say that he still has some since his discharge.  I asked him to take that every 3-4 hours right now to see if that we will be able to keep him out of the hospital and to try to be as aggressive with fluid intake (try to take in 40 to 60 ounces of Gatorade/Powerade in the next 4 to 5 hours) and monitor that he is making urine.  If his pain is not controllable or he is not tolerating enough oral intake to stay hydrated, I recommendation will be for him to go to the emergency department at [facility] or [facility] for further evaluation.  It The patient's CT scan from 2 days ago has not been read, I left a message with [facility] radiology to try to expedite the read of this.  I have reviewed it and I do not see any significant biliary duct dilation but there certainly looks to be a developing fluid collection in the head/neck area.  I do not see any other severe complicating factors currently of the CT.  Hopefully we will get that report in the next few hours.   They agreed to this plan.   I will have my team do the following: 1) reach back out to the patient/wife around 4/415 and find out how they are doing 2) if they are doing okay and patient is tolerating oral intake as well as getting in some food then we can try to manage him with continued pain medicine overnight and fluid intake into tomorrow and reassess 3) if they are not doing well, patient needs to come to the emergency department 4) will have my team please reach out to [facility] radiology and see if they can expedite things further (though I already left a voicemail with them around 12 PM)     Patty or covering RN, please be aware of my recommendations above. GM"  CT results below:  IMPRESSION: 1. No significant change in size of peripancreatic fluid collections compared with the most recent prior study. These collections may reflect organizing pseudocysts or walled off necrosis in the setting of previous pancreatic necrosis. No evidence of acute pancreatic hemorrhage. 2. No new or enlarging collections. 3. No biliary dilatation status post cholecystectomy. Small amount of pneumobilia. 4. Sigmoid diverticulosis without evidence of acute inflammation. Mildly prominent stool throughout the colon. 5. Coronary and aortic Atherosclerosis (ICD10-I70.0).   Electronically Signed   By: Carey Bullocks M.D.   On: 01/26/2023 17:25  They were concerned about how much fluids he was taking. He is able to take reasonable amounts, but not quite the volume prescribed above. No nausea, vomiting, fever. Pain is now 2/10 and he is not needing pain meds.  After answering multiple questions, I recommended that he retire for the evening. I do not think that he needs to go to the hospital tonight (nor do they).  I will ask Dr. Meridee Score to check on him in the morning.  Jon Bray. Jon Bray., M.D. [facility] Division of Gastroenterology

## 2023-01-26 NOTE — Telephone Encounter (Signed)
Patient and wife stopped by office after blood work requesting a sooner appt with a PA or Dr. Meridee Score. States patient is doing worse everyday. Requesting a call back.

## 2023-01-26 NOTE — Telephone Encounter (Signed)
Patient called wanted to ask how much should he be drinking liquid a day.

## 2023-01-26 NOTE — Telephone Encounter (Signed)
See alternate note  

## 2023-01-27 DIAGNOSIS — C44719 Basal cell carcinoma of skin of left lower limb, including hip: Secondary | ICD-10-CM | POA: Diagnosis not present

## 2023-01-27 DIAGNOSIS — D2261 Melanocytic nevi of right upper limb, including shoulder: Secondary | ICD-10-CM | POA: Diagnosis not present

## 2023-01-27 DIAGNOSIS — D2262 Melanocytic nevi of left upper limb, including shoulder: Secondary | ICD-10-CM | POA: Diagnosis not present

## 2023-01-27 DIAGNOSIS — D225 Melanocytic nevi of trunk: Secondary | ICD-10-CM | POA: Diagnosis not present

## 2023-01-27 DIAGNOSIS — Z08 Encounter for follow-up examination after completed treatment for malignant neoplasm: Secondary | ICD-10-CM | POA: Diagnosis not present

## 2023-01-27 DIAGNOSIS — D485 Neoplasm of uncertain behavior of skin: Secondary | ICD-10-CM | POA: Diagnosis not present

## 2023-01-27 DIAGNOSIS — L821 Other seborrheic keratosis: Secondary | ICD-10-CM | POA: Diagnosis not present

## 2023-01-27 DIAGNOSIS — M9901 Segmental and somatic dysfunction of cervical region: Secondary | ICD-10-CM | POA: Diagnosis not present

## 2023-01-27 DIAGNOSIS — M62830 Muscle spasm of back: Secondary | ICD-10-CM | POA: Diagnosis not present

## 2023-01-27 DIAGNOSIS — C4441 Basal cell carcinoma of skin of scalp and neck: Secondary | ICD-10-CM | POA: Diagnosis not present

## 2023-01-27 DIAGNOSIS — D2271 Melanocytic nevi of right lower limb, including hip: Secondary | ICD-10-CM | POA: Diagnosis not present

## 2023-01-27 DIAGNOSIS — Z85828 Personal history of other malignant neoplasm of skin: Secondary | ICD-10-CM | POA: Diagnosis not present

## 2023-01-27 DIAGNOSIS — D2272 Melanocytic nevi of left lower limb, including hip: Secondary | ICD-10-CM | POA: Diagnosis not present

## 2023-01-27 DIAGNOSIS — M5136 Other intervertebral disc degeneration, lumbar region: Secondary | ICD-10-CM | POA: Diagnosis not present

## 2023-01-27 DIAGNOSIS — D044 Carcinoma in situ of skin of scalp and neck: Secondary | ICD-10-CM | POA: Diagnosis not present

## 2023-01-27 DIAGNOSIS — M9903 Segmental and somatic dysfunction of lumbar region: Secondary | ICD-10-CM | POA: Diagnosis not present

## 2023-01-27 DIAGNOSIS — D0439 Carcinoma in situ of skin of other parts of face: Secondary | ICD-10-CM | POA: Diagnosis not present

## 2023-01-27 NOTE — Telephone Encounter (Signed)
See results note dated 7/25

## 2023-01-28 ENCOUNTER — Other Ambulatory Visit: Payer: Self-pay

## 2023-01-28 ENCOUNTER — Emergency Department (HOSPITAL_COMMUNITY): Payer: HMO

## 2023-01-28 ENCOUNTER — Encounter (HOSPITAL_COMMUNITY): Payer: Self-pay

## 2023-01-28 ENCOUNTER — Inpatient Hospital Stay (HOSPITAL_COMMUNITY)
Admission: EM | Admit: 2023-01-28 | Discharge: 2023-02-08 | DRG: 439 | Disposition: A | Discharge status: Home Health | Payer: HMO | Attending: Student | Admitting: Student

## 2023-01-28 DIAGNOSIS — R112 Nausea with vomiting, unspecified: Secondary | ICD-10-CM

## 2023-01-28 DIAGNOSIS — K5730 Diverticulosis of large intestine without perforation or abscess without bleeding: Secondary | ICD-10-CM | POA: Diagnosis not present

## 2023-01-28 DIAGNOSIS — E785 Hyperlipidemia, unspecified: Secondary | ICD-10-CM | POA: Diagnosis present

## 2023-01-28 DIAGNOSIS — K2960 Other gastritis without bleeding: Secondary | ICD-10-CM | POA: Diagnosis not present

## 2023-01-28 DIAGNOSIS — I480 Paroxysmal atrial fibrillation: Secondary | ICD-10-CM | POA: Diagnosis not present

## 2023-01-28 DIAGNOSIS — K851 Biliary acute pancreatitis without necrosis or infection: Secondary | ICD-10-CM | POA: Diagnosis not present

## 2023-01-28 DIAGNOSIS — M7989 Other specified soft tissue disorders: Secondary | ICD-10-CM | POA: Diagnosis not present

## 2023-01-28 DIAGNOSIS — K2970 Gastritis, unspecified, without bleeding: Secondary | ICD-10-CM | POA: Diagnosis not present

## 2023-01-28 DIAGNOSIS — D649 Anemia, unspecified: Secondary | ICD-10-CM | POA: Diagnosis present

## 2023-01-28 DIAGNOSIS — K219 Gastro-esophageal reflux disease without esophagitis: Secondary | ICD-10-CM | POA: Diagnosis not present

## 2023-01-28 DIAGNOSIS — Z4682 Encounter for fitting and adjustment of non-vascular catheter: Secondary | ICD-10-CM | POA: Diagnosis not present

## 2023-01-28 DIAGNOSIS — K8591 Acute pancreatitis with uninfected necrosis, unspecified: Secondary | ICD-10-CM

## 2023-01-28 DIAGNOSIS — Z6822 Body mass index (BMI) 22.0-22.9, adult: Secondary | ICD-10-CM

## 2023-01-28 DIAGNOSIS — I252 Old myocardial infarction: Secondary | ICD-10-CM | POA: Diagnosis not present

## 2023-01-28 DIAGNOSIS — K861 Other chronic pancreatitis: Secondary | ICD-10-CM | POA: Diagnosis not present

## 2023-01-28 DIAGNOSIS — R9389 Abnormal findings on diagnostic imaging of other specified body structures: Secondary | ICD-10-CM | POA: Diagnosis not present

## 2023-01-28 DIAGNOSIS — R911 Solitary pulmonary nodule: Secondary | ICD-10-CM | POA: Diagnosis not present

## 2023-01-28 DIAGNOSIS — I25119 Atherosclerotic heart disease of native coronary artery with unspecified angina pectoris: Secondary | ICD-10-CM | POA: Diagnosis not present

## 2023-01-28 DIAGNOSIS — I10 Essential (primary) hypertension: Secondary | ICD-10-CM | POA: Diagnosis not present

## 2023-01-28 DIAGNOSIS — K449 Diaphragmatic hernia without obstruction or gangrene: Secondary | ICD-10-CM | POA: Diagnosis present

## 2023-01-28 DIAGNOSIS — K859 Acute pancreatitis without necrosis or infection, unspecified: Secondary | ICD-10-CM | POA: Diagnosis present

## 2023-01-28 DIAGNOSIS — N179 Acute kidney failure, unspecified: Secondary | ICD-10-CM | POA: Diagnosis not present

## 2023-01-28 DIAGNOSIS — E1151 Type 2 diabetes mellitus with diabetic peripheral angiopathy without gangrene: Secondary | ICD-10-CM | POA: Diagnosis not present

## 2023-01-28 DIAGNOSIS — E11649 Type 2 diabetes mellitus with hypoglycemia without coma: Secondary | ICD-10-CM | POA: Diagnosis not present

## 2023-01-28 DIAGNOSIS — R933 Abnormal findings on diagnostic imaging of other parts of digestive tract: Secondary | ICD-10-CM | POA: Diagnosis not present

## 2023-01-28 DIAGNOSIS — R6881 Early satiety: Secondary | ICD-10-CM | POA: Diagnosis not present

## 2023-01-28 DIAGNOSIS — K863 Pseudocyst of pancreas: Secondary | ICD-10-CM | POA: Diagnosis present

## 2023-01-28 DIAGNOSIS — E871 Hypo-osmolality and hyponatremia: Secondary | ICD-10-CM | POA: Diagnosis not present

## 2023-01-28 DIAGNOSIS — K222 Esophageal obstruction: Secondary | ICD-10-CM | POA: Diagnosis present

## 2023-01-28 DIAGNOSIS — Z87442 Personal history of urinary calculi: Secondary | ICD-10-CM

## 2023-01-28 DIAGNOSIS — K5900 Constipation, unspecified: Secondary | ICD-10-CM | POA: Diagnosis not present

## 2023-01-28 DIAGNOSIS — I2510 Atherosclerotic heart disease of native coronary artery without angina pectoris: Secondary | ICD-10-CM | POA: Diagnosis not present

## 2023-01-28 DIAGNOSIS — K254 Chronic or unspecified gastric ulcer with hemorrhage: Secondary | ICD-10-CM | POA: Diagnosis not present

## 2023-01-28 DIAGNOSIS — Z85828 Personal history of other malignant neoplasm of skin: Secondary | ICD-10-CM

## 2023-01-28 DIAGNOSIS — I700 Atherosclerosis of aorta: Secondary | ICD-10-CM | POA: Diagnosis not present

## 2023-01-28 DIAGNOSIS — K429 Umbilical hernia without obstruction or gangrene: Secondary | ICD-10-CM | POA: Diagnosis not present

## 2023-01-28 DIAGNOSIS — R0781 Pleurodynia: Secondary | ICD-10-CM | POA: Diagnosis present

## 2023-01-28 DIAGNOSIS — E876 Hypokalemia: Secondary | ICD-10-CM | POA: Diagnosis present

## 2023-01-28 DIAGNOSIS — K2100 Gastro-esophageal reflux disease with esophagitis, without bleeding: Secondary | ICD-10-CM | POA: Diagnosis present

## 2023-01-28 DIAGNOSIS — Z9049 Acquired absence of other specified parts of digestive tract: Secondary | ICD-10-CM

## 2023-01-28 DIAGNOSIS — Z961 Presence of intraocular lens: Secondary | ICD-10-CM | POA: Diagnosis present

## 2023-01-28 DIAGNOSIS — E860 Dehydration: Secondary | ICD-10-CM | POA: Diagnosis not present

## 2023-01-28 DIAGNOSIS — Z79899 Other long term (current) drug therapy: Secondary | ICD-10-CM

## 2023-01-28 DIAGNOSIS — E440 Moderate protein-calorie malnutrition: Secondary | ICD-10-CM | POA: Insufficient documentation

## 2023-01-28 DIAGNOSIS — K3189 Other diseases of stomach and duodenum: Secondary | ICD-10-CM | POA: Diagnosis present

## 2023-01-28 DIAGNOSIS — Z7982 Long term (current) use of aspirin: Secondary | ICD-10-CM

## 2023-01-28 DIAGNOSIS — K259 Gastric ulcer, unspecified as acute or chronic, without hemorrhage or perforation: Secondary | ICD-10-CM | POA: Diagnosis present

## 2023-01-28 DIAGNOSIS — Z955 Presence of coronary angioplasty implant and graft: Secondary | ICD-10-CM

## 2023-01-28 DIAGNOSIS — I517 Cardiomegaly: Secondary | ICD-10-CM | POA: Diagnosis not present

## 2023-01-28 DIAGNOSIS — R188 Other ascites: Secondary | ICD-10-CM | POA: Diagnosis not present

## 2023-01-28 DIAGNOSIS — R079 Chest pain, unspecified: Secondary | ICD-10-CM | POA: Diagnosis not present

## 2023-01-28 DIAGNOSIS — R1013 Epigastric pain: Secondary | ICD-10-CM | POA: Diagnosis not present

## 2023-01-28 DIAGNOSIS — K2971 Gastritis, unspecified, with bleeding: Secondary | ICD-10-CM | POA: Diagnosis not present

## 2023-01-28 DIAGNOSIS — K8592 Acute pancreatitis with infected necrosis, unspecified: Principal | ICD-10-CM

## 2023-01-28 LAB — COMPREHENSIVE METABOLIC PANEL
ALT: 49 U/L — ABNORMAL HIGH (ref 0–44)
AST: 34 U/L (ref 15–41)
Albumin: 3 g/dL — ABNORMAL LOW (ref 3.5–5.0)
Alkaline Phosphatase: 71 U/L (ref 38–126)
Anion gap: 10 (ref 5–15)
BUN: 30 mg/dL — ABNORMAL HIGH (ref 8–23)
CO2: 23 mmol/L (ref 22–32)
Calcium: 8.5 mg/dL — ABNORMAL LOW (ref 8.9–10.3)
Chloride: 104 mmol/L (ref 98–111)
Creatinine, Ser: 0.93 mg/dL (ref 0.61–1.24)
GFR, Estimated: >60 mL/min (ref >60)
Glucose, Bld: 148 mg/dL — ABNORMAL HIGH (ref 70–99)
Potassium: 3.7 mmol/L (ref 3.5–5.1)
Sodium: 137 mmol/L (ref 135–145)
Total Bilirubin: 2.8 mg/dL — ABNORMAL HIGH (ref 0.3–1.2)
Total Protein: 7 g/dL (ref 6.5–8.1)

## 2023-01-28 LAB — URINALYSIS, ROUTINE W REFLEX MICROSCOPIC
Bilirubin Urine: NEGATIVE
Glucose, UA: NEGATIVE mg/dL
Hgb urine dipstick: NEGATIVE
Ketones, ur: NEGATIVE mg/dL
Leukocytes,Ua: NEGATIVE
Nitrite: NEGATIVE
Protein, ur: 100 mg/dL — AB
Specific Gravity, Urine: 1.031 — ABNORMAL HIGH (ref 1.005–1.030)
pH: 5 (ref 5.0–8.0)

## 2023-01-28 LAB — CBC WITH DIFFERENTIAL/PLATELET
Abs Immature Granulocytes: 0.08 10*3/uL — ABNORMAL HIGH (ref 0.00–0.07)
Basophils Absolute: 0.1 10*3/uL (ref 0.0–0.1)
Basophils Relative: 0 %
Eosinophils Absolute: 0.1 10*3/uL (ref 0.0–0.5)
Eosinophils Relative: 0 %
HCT: 36.9 % — ABNORMAL LOW (ref 39.0–52.0)
Hemoglobin: 11.8 g/dL — ABNORMAL LOW (ref 13.0–17.0)
Immature Granulocytes: 1 %
Lymphocytes Relative: 14 %
Lymphs Abs: 2.4 10*3/uL (ref 0.7–4.0)
MCH: 28.6 pg (ref 26.0–34.0)
MCHC: 32 g/dL (ref 30.0–36.0)
MCV: 89.3 fL (ref 80.0–100.0)
Monocytes Absolute: 1.4 10*3/uL — ABNORMAL HIGH (ref 0.1–1.0)
Monocytes Relative: 8 %
Neutro Abs: 13.2 10*3/uL — ABNORMAL HIGH (ref 1.7–7.7)
Neutrophils Relative %: 77 %
Platelets: 235 10*3/uL (ref 150–400)
RBC: 4.13 MIL/uL — ABNORMAL LOW (ref 4.22–5.81)
RDW: 15.1 % (ref 11.5–15.5)
WBC: 17.2 10*3/uL — ABNORMAL HIGH (ref 4.0–10.5)
nRBC: 0 % (ref 0.0–0.2)

## 2023-01-28 LAB — LIPASE, BLOOD: Lipase: 65 U/L — ABNORMAL HIGH (ref 11–51)

## 2023-01-28 LAB — AMYLASE: Amylase: 351 U/L — ABNORMAL HIGH (ref 28–100)

## 2023-01-28 LAB — SEDIMENTATION RATE: Sed Rate: 67 mm/hr — ABNORMAL HIGH (ref 0–16)

## 2023-01-28 LAB — GLUCOSE, CAPILLARY: Glucose-Capillary: 104 mg/dL — ABNORMAL HIGH (ref 70–99)

## 2023-01-28 LAB — C-REACTIVE PROTEIN: CRP: 19.3 mg/dL — ABNORMAL HIGH (ref <1.0)

## 2023-01-28 MED ORDER — IOHEXOL 350 MG/ML SOLN
75.0000 mL | Freq: Once | INTRAVENOUS | Status: AC | PRN
  Administered 2023-01-28: 75 mL via INTRAVENOUS

## 2023-01-28 MED ORDER — ASPIRIN 81 MG PO CHEW
81.0000 mg | CHEWABLE_TABLET | Freq: Every day | ORAL | Status: DC
  Administered 2023-01-29 – 2023-02-05 (×7): 81 mg via ORAL
  Filled 2023-01-28 (×9): qty 1

## 2023-01-28 MED ORDER — MORPHINE SULFATE (PF) 4 MG/ML IV SOLN
4.0000 mg | Freq: Once | INTRAVENOUS | Status: AC
  Administered 2023-01-28: 4 mg via INTRAVENOUS
  Filled 2023-01-28: qty 1

## 2023-01-28 MED ORDER — SODIUM CHLORIDE 0.9 % IV SOLN: INTRAVENOUS | Status: DC

## 2023-01-28 MED ORDER — INSULIN ASPART 100 UNIT/ML IJ SOLN
0.0000 [IU] | INTRAMUSCULAR | Status: DC
  Administered 2023-01-30 – 2023-02-04 (×9): 1 [IU] via SUBCUTANEOUS
  Administered 2023-02-05: 3 [IU] via SUBCUTANEOUS
  Administered 2023-02-05: 1 [IU] via SUBCUTANEOUS
  Administered 2023-02-05: 2 [IU] via SUBCUTANEOUS

## 2023-01-28 MED ORDER — ENOXAPARIN SODIUM 40 MG/0.4ML IJ SOSY
40.0000 mg | PREFILLED_SYRINGE | INTRAMUSCULAR | Status: DC
  Administered 2023-01-28 – 2023-02-07 (×11): 40 mg via SUBCUTANEOUS
  Filled 2023-01-28 (×12): qty 0.4

## 2023-01-28 MED ORDER — ACETAMINOPHEN 650 MG RE SUPP: 650.0000 mg | Freq: Four times a day (QID) | RECTAL | Status: DC | PRN

## 2023-01-28 MED ORDER — ONDANSETRON HCL 4 MG/2ML IJ SOLN
4.0000 mg | Freq: Four times a day (QID) | INTRAMUSCULAR | Status: DC | PRN
  Administered 2023-01-29 – 2023-02-04 (×8): 4 mg via INTRAVENOUS
  Filled 2023-01-28 (×9): qty 2

## 2023-01-28 MED ORDER — PANTOPRAZOLE SODIUM 40 MG PO TBEC
40.0000 mg | DELAYED_RELEASE_TABLET | Freq: Every day | ORAL | Status: DC
  Administered 2023-01-29 – 2023-01-30 (×2): 40 mg via ORAL
  Filled 2023-01-28 (×3): qty 1

## 2023-01-28 MED ORDER — LACTATED RINGERS IV BOLUS
1000.0000 mL | Freq: Once | INTRAVENOUS | Status: AC
  Administered 2023-01-28: 1000 mL via INTRAVENOUS

## 2023-01-28 MED ORDER — HYDROMORPHONE HCL 1 MG/ML IJ SOLN
0.5000 mg | INTRAMUSCULAR | Status: DC | PRN
  Administered 2023-01-29 (×2): 0.5 mg via INTRAVENOUS
  Filled 2023-01-28 (×2): qty 0.5

## 2023-01-28 MED ORDER — ONDANSETRON HCL 4 MG/2ML IJ SOLN
4.0000 mg | Freq: Once | INTRAMUSCULAR | Status: AC
  Administered 2023-01-28: 4 mg via INTRAVENOUS
  Filled 2023-01-28: qty 2

## 2023-01-28 MED ORDER — ACETAMINOPHEN 325 MG PO TABS: 650.0000 mg | ORAL_TABLET | Freq: Four times a day (QID) | ORAL | Status: DC | PRN

## 2023-01-28 MED ORDER — FAMOTIDINE 20 MG PO TABS
40.0000 mg | ORAL_TABLET | Freq: Every day | ORAL | Status: DC
  Administered 2023-01-28 – 2023-02-05 (×6): 40 mg via ORAL
  Filled 2023-01-28 (×8): qty 2

## 2023-01-28 MED ORDER — ONDANSETRON HCL 4 MG PO TABS: 4.0000 mg | ORAL_TABLET | Freq: Four times a day (QID) | ORAL | Status: DC | PRN

## 2023-01-28 NOTE — Telephone Encounter (Signed)
I spoke with the pt wife and she tells me that the pt is doing worse and is headed to [facility] ED.  FYI Dr Meridee Score

## 2023-01-28 NOTE — Telephone Encounter (Signed)
See 7/26 results note

## 2023-01-28 NOTE — H&P (Signed)
History and Physical    Jon Bray XLK:[id number] DOB: 03/09/1941 DOA: 01/28/2023  PCP: Danella Penton, MD   Patient coming from: Home   Chief Complaint: Abdominal pain, nausea   HPI: Jon Bray is a pleasant 82 y.o. male with medical history significant for CAD, PAF not anticoagulated, and admission in May 2024 for acute gallstone pancreatitis status post cholecystectomy on 12/03/2022 who presents with abdominal pain and nausea.  Patient feels that he is dehydrated, explaining that he feels full and bloated after eating or drinking only a small amount.  He also continues to have abdominal pain intermittently which can be severe at times.  He had severe nausea today but has not vomited.  He denies any fevers, chills, cough, shortness of breath, or dysuria.  ED Course: Upon arrival to the ED, patient is found to be afebrile and saturating well on room air with normal heart rate and blood pressure.  Labs are most notable for elevated BUN to creatinine ratio, total bilirubin 2.8, WBC 17,200, amylase 351, lipase 65, CRP 19.3, and ESR 67.  CT is concerning for severe acute pancreatitis with increase in number and size of fluid collections.  No biliary ductal dilatation noted on CT.  GI was consulted by the ED physician and the patient was given a liter of LR, morphine, and Zofran.  Review of Systems:  All other systems reviewed and apart from HPI, are negative.  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   Basal cell carcinoma of skin    a.) s/p Mohs procedure R/L nasal ala 01/27/2015   CAD (coronary artery disease) 05/12/2013   a.) STEMI --> LHC 05/12/2013: 20% p-mLAD, 20% D2, 40% LPDA, 30% LPAV, 99% dLCx --> PCI placing 4.5 x 20 mm and 2.5 x 12 mm Veriflex BMS to LPAV extending to dLCx. b.) cCTA 12/27/2017: Ca score 1578; 82nd percentile for age/sex match  control; CT-FFR: RCA 0.99, pLAD 0.97, mLAD 0.98, dLAD 0.89, pLCx 0.98, mLCx 0.97, dLCx 0.97, PLB 0.91, PDA (most distal segment) 0.80.   Cataract    a.) s/p extraction with IOL placement   CKD (chronic kidney disease)    Diverticulosis    GERD (gastroesophageal reflux disease)    History of kidney stones 2023   HLD (hyperlipidemia)    Left lower lobe pulmonary nodule 08/01/2021   a.) CT 08/01/2021: measured 4 mm; perifissural   Long term current use of anticoagulant    a.) apixaban   NSVT (nonsustained ventricular tachycardia) (HCC) 05/13/2013   a.) 24 beat run while in ICU following PCI for STEMI   PAF (paroxysmal atrial fibrillation) (HCC) 11/16/2021   a.) new onset in ED on 11/16/2021. b.) CHA2DS2-VASc = 4 (age x 2, STEMI/aortic plaque, T2DM). c.) rate controlled without pharmacological intervention; started on apixaban 11/16/2021, however PCP discontinued on 11/18/2021 pending Holter study results.   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   Type 2 diabetes, diet controlled [facility])     Past Surgical History:  Procedure Laterality Date   BIOPSY  12/02/2022   Procedure: BIOPSY;  Surgeon: [name].,  MD;  Location: [facility] ENDOSCOPY;  Service: Gastroenterology;;   CATARACT EXTRACTION W/ INTRAOCULAR LENS IMPLANT Bilateral    CHOLECYSTECTOMY N/A 12/03/2022   Procedure: LAPAROSCOPIC CHOLECYSTECTOMY;  Surgeon: [name], MD;  Location: [facility] OR;  Service: General;  Laterality: N/A;   COLONOSCOPY N/A 01/31/2001   COLONOSCOPY N/A 11/19/2008   CORONARY ANGIOPLASTY WITH STENT PLACEMENT Left 05/12/2013   Procedure: CORONARY ANGIOPLASTY WITH STENT PLACEMENT; Location: [facility]; Surgeon: Lieutenant Diego, MD   ENDOSCOPIC RETROGRADE CHOLANGIOPANCREATOGRAPHY (ERCP) WITH  PROPOFOL N/A 11/22/2022   Procedure: ENDOSCOPIC RETROGRADE CHOLANGIOPANCREATOGRAPHY (ERCP) WITH PROPOFOL;  Surgeon: Iva Boop, MD;  Location: [facility] ENDOSCOPY;  Service: Gastroenterology;  Laterality: N/A;   ERCP N/A 12/02/2022   Procedure: ENDOSCOPIC RETROGRADE CHOLANGIOPANCREATOGRAPHY (ERCP);  Surgeon: [name]., MD;  Location: [facility] ENDOSCOPY;  Service: Gastroenterology;  Laterality: N/A;   ESOPHAGOGASTRODUODENOSCOPY N/A 03/03/2000   ESOPHAGOGASTRODUODENOSCOPY (EGD) WITH PROPOFOL N/A 10/16/2021   Procedure: ESOPHAGOGASTRODUODENOSCOPY (EGD) WITH PROPOFOL;  Surgeon: Regis Bill, MD;  Location: [facility] ENDOSCOPY;  Service: Endoscopy;  Laterality: N/A;   EXTRACORPOREAL SHOCK WAVE LITHOTRIPSY Left 08/06/2021   Procedure: EXTRACORPOREAL SHOCK WAVE LITHOTRIPSY (ESWL);  Surgeon: Vanna Scotland, MD;  Location: [facility] ORS;  Service: Urology;  Laterality: Left;   INGUINAL HERNIA REPAIR Right 11/27/2021   Procedure: HERNIA REPAIR INGUINAL ADULT;  Surgeon: Earline Mayotte, MD;  Location: [facility] ORS;  Service: General;  Laterality: Right;   INTRAOCULAR LENS EXCHANGE Left 12/27/2018   Procedure: MECHANICAL VITRECTOMY,  EXCHANGE LENS PROSTHESIS VIA PARS PLANA APPROACH; Location: [facility]; Surgeon: Nicholaus Corolla, MD   MOHS SURGERY Bilateral 01/27/2015   Procedure: MOHS SURGERY TO REMOVE BASAL CELL CARCINOMA FROM RIGHT/LEFT NASAL ALA; Location: [facility]; Surgeon: Katrine Coho, MD   REMOVAL OF STONES  12/02/2022   Procedure: REMOVAL OF STONES;  Surgeon: Meridee Score Netty Starring., MD;  Location: [facility] ENDOSCOPY;  Service: Gastroenterology;;   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   Procedure: RPR RETINAL DTCHMNT W/VITRECTOMY ANY METH REPAIR RETINAL DETACHMENT; WITH VITRECTOMY, INC, WHEN PERFORMED, AIR/GAS TAMPONADE, FOCAL ENDOLASER PHOTOCOAGULATION, CRYOTHERAPY, DRAINAGE OF SUBRETINAL FLUID, SCLERAL BUCKLING AND/OR REMOVAL OF LENS; Location: [facility]; Surgeon: Nicholaus Corolla, MD   SINUSOTOMY N/A    Procedure:  MAXILLARY SINUSOTOMY INTRANASAL   SPHINCTEROTOMY  12/02/2022   Procedure: SPHINCTEROTOMY;  Surgeon: Mansouraty, Netty Starring., MD;  Location: [facility] ENDOSCOPY;  Service: Gastroenterology;;   TONSILLECTOMY AND ADENOIDECTOMY N/A     Social History:   reports that he has never smoked. He has never used smokeless tobacco. He reports that he does not drink alcohol and does not use drugs.  No Known Allergies  History reviewed. No pertinent family history.   Prior to Admission medications   Medication Sig Start Date End Date Taking? Authorizing Provider  aspirin 81 MG chewable tablet Chew 81 mg by mouth daily.    [provider]  bisacodyl (DULCOLAX) 5 MG EC tablet Take 5 mg by mouth daily as needed for moderate constipation. Patient not taking: Reported on 03/09/2022    [provider]  cholestyramine light (PREVALITE) 4 g packet Take 0.5 packets (2 g total) by mouth 2 (two) times daily. 12/10/22 01/09/23  Pahwani, Kasandra Knudsen, MD  famotidine (PEPCID) 40 MG tablet Take 40 mg by mouth at bedtime.    [provider]  feeding supplement (ENSURE ENLIVE / ENSURE PLUS) LIQD Take 237 mLs by mouth 3 (three) times daily between meals. 12/10/22   Pahwani, Kasandra Knudsen, MD  Lactobacillus (PROBIOTIC ACIDOPHILUS PO) Take 1 tablet by mouth daily.    [provider]  Multiple Vitamin (MULTIVITAMIN) tablet Take 1 tablet by mouth daily.    [provider]  pantoprazole (PROTONIX) 40 MG tablet Take 40 mg by mouth daily.    [provider]  sodium chloride (MURO 128) 5 % ophthalmic ointment Place 1 Application into both eyes at bedtime.    [provider]  Zinc Sulfate (ZINC-220 PO) Take 1 tablet by mouth daily.    [provider]    Physical Exam: Vitals:   01/28/23 1815 01/28/23 1830 01/28/23 1957 01/28/23 1958  BP: 116/69 117/65 129/76   Pulse: 72 71 75 75  Resp:   16   Temp:      TempSrc:      SpO2: 98% 98% 100% 100%  Weight:      Height:          Constitutional: NAD, calm  Eyes: PERTLA, lids and conjunctivae normal ENMT: Mucous membranes are moist. Posterior pharynx clear of any exudate or lesions.   Neck: supple, no masses  Respiratory: no wheezing, no crackles. No accessory muscle use.  Cardiovascular: S1 & S2 heard, regular rate and rhythm. No extremity edema.   Abdomen: Soft, no distension, tender in upper abdomen. Bowel sounds active.  Musculoskeletal: no clubbing / cyanosis. No joint deformity upper and lower extremities.   Skin: no significant rashes, lesions, ulcers. Warm, dry, well-perfused. Neurologic: CN 2-12 grossly intact. Moving all extremities. Alert and oriented.  Psychiatric: Pleasant. Cooperative.    Labs and Imaging on Admission: I have personally reviewed following labs and imaging studies  CBC: Recent Labs  Lab 01/26/23 0941 01/28/23 1321  WBC 9.5 17.2*  NEUTROABS 6.3 13.2*  HGB 12.3* 11.8*  HCT 38.1* 36.9*  MCV 86.5 89.3  PLT 266.0 235   Basic Metabolic Panel: Recent Labs  Lab 01/26/23 0941 01/28/23 1321  NA 140 137  K 4.1 3.7  CL 106 104  CO2 25 23  GLUCOSE 111* 148*  BUN 23 30*  CREATININE 0.89 0.93  CALCIUM 9.0 8.5*   GFR: Estimated Creatinine Clearance: 58.2 mL/min (by C-G formula based on SCr of 0.93 mg/dL). Liver Function Tests: Recent Labs  Lab 01/26/23 0941 01/28/23 1321  AST 36 34  ALT 49 49*  ALKPHOS 68 71  BILITOT 0.8 2.8*  PROT 7.1 7.0  ALBUMIN 3.7 3.0*   Recent Labs  Lab 01/26/23 0941 01/28/23 1321 01/28/23 1851  LIPASE 1,258.0* 65*  --   AMYLASE 902*  --  351*   No results for input(s): "AMMONIA" in the last 168 hours. Coagulation Profile: No results for input(s): "INR", "PROTIME" in the last 168 hours. Cardiac Enzymes: No results for input(s): "CKTOTAL", "CKMB", "CKMBINDEX", "TROPONINI" in the last 168 hours. BNP (last 3 results) No results for input(s): "PROBNP" in the last 8760 hours. HbA1C: No results for input(s): "HGBA1C" in the last 72  hours. CBG: No results for input(s): "GLUCAP" in the last 168 hours. Lipid Profile: No results for input(s): "CHOL", "HDL", "LDLCALC", "TRIG", "CHOLHDL", "LDLDIRECT" in the last 72 hours. Thyroid Function Tests: No results for input(s): "TSH", "T4TOTAL", "FREET4", "T3FREE", "THYROIDAB" in the last 72 hours. Anemia Panel: No results for input(s): "VITAMINB12", "FOLATE", "FERRITIN", "TIBC", "IRON", "RETICCTPCT" in the last 72 hours. Urine analysis:    Component Value Date/Time   COLORURINE AMBER (A) 01/28/2023 1312   APPEARANCEUR HAZY (A) 01/28/2023 1312   APPEARANCEUR Clear 08/28/2021 1113   LABSPEC 1.031 (H) 01/28/2023 1312   PHURINE 5.0 01/28/2023 1312   GLUCOSEU NEGATIVE 01/28/2023 1312   HGBUR NEGATIVE  01/28/2023 1312   BILIRUBINUR NEGATIVE 01/28/2023 1312   BILIRUBINUR Negative 08/28/2021 1113   KETONESUR NEGATIVE 01/28/2023 1312   PROTEINUR 100 (A) 01/28/2023 1312   NITRITE NEGATIVE 01/28/2023 1312   LEUKOCYTESUR NEGATIVE 01/28/2023 1312   Sepsis Labs: @LABRCNTIP (procalcitonin:4,lacticidven:4) )No results found for this or any previous visit (from the past 240 hour(s)).   Radiological Exams on Admission: CT ABDOMEN PELVIS W CONTRAST  Result Date: 01/28/2023 CLINICAL DATA:  Severe acute pancreatitis EXAM: CT ABDOMEN AND PELVIS WITH CONTRAST TECHNIQUE: Multidetector CT imaging of the abdomen and pelvis was performed using the standard protocol following bolus administration of intravenous contrast. RADIATION DOSE REDUCTION: This exam was performed according to the departmental dose-optimization program which includes automated exposure control, adjustment of the mA and/or kV according to patient size and/or use of iterative reconstruction technique. CONTRAST:  75mL OMNIPAQUE IOHEXOL 350 MG/ML SOLN COMPARISON:  Previous studies including the examination of 01/24/2023 FINDINGS: Lower chest: Visualized lower lung fields are clear. Coronary artery calcifications are seen.  Hepatobiliary: Surgical clips are seen in gallbladder fossa. There are pockets of air in intrahepatic bile ducts. Bile ducts are not dilated. Pancreas: There is enlargement of the head of the pancreas. There are multiple loculated fluid collections in and adjacent to head and proximal body of pancreas. There is interval increase in number and size of the loculated fluid collections in and around head of the pancreas due to severe acute pancreatitis. Largest of these collections measures 4.9 x 3.5 cm in the anterior aspect of the head. There are multiple other smaller loculated fluid collections. Overall size body and tail of pancreas has not changed. There is homogeneous enhancement in body and tail of pancreas. There is 2.2 cm low-density in the anterior margin of tail of pancreas with no significant change. Spleen: Unremarkable. Adrenals/Urinary Tract: Adrenals are unremarkable. There is no hydronephrosis. There are no renal or ureteral stones. Urinary bladder is not distended. Stomach/Bowel: There is wall thickening in the antrum of the stomach and duodenum. Rest of the small bowel loops are unremarkable. The appendix is not dilated. There is no significant wall thickening in colon. Multiple diverticula are seen in the colon, especially in sigmoid with no evidence of focal acute diverticulitis. Vascular/Lymphatic: Calcifications are seen in aorta and its major branches. Reproductive: Unremarkable. Other: There is no ascites or pneumoperitoneum. Umbilical hernia containing fat is seen. Right inguinal hernia containing fat is seen. Possible small left inguinal hernia containing fat is seen. Musculoskeletal: Degenerative changes are noted in lumbar spine, more severe at L2-L3 level. There is encroachment of neural foramina at multiple levels. IMPRESSION: Severe acute pancreatitis in the head of the pancreas. There is interval increase in number and size of multiple loculated fluid collections in and around head of  the pancreas suggesting interval worsening of acute pancreatitis with possible multiple pseudocysts or foci of pancreatic necrosis. Largest of these fluid collections measures 4.9 x 3.5 cm. There are no pockets of air within these fluid collections. There is a 2.2 cm low-density in the tail of pancreas with no interval change. There is no definite demonstrable new focal pancreatic necrosis in the body and tail. There is wall thickening in the distal antrum of the stomach and the duodenum, possibly gastritis and duodenitis There is no evidence of intestinal obstruction or pneumoperitoneum. Appendix is not dilated. There is no hydronephrosis. Diverticulosis of colon without signs of focal diverticulitis. Aortic arteriosclerosis. Coronary artery disease. Lumbar spondylosis. Electronically Signed   By: Harlan Stains.D.  On: 01/28/2023 20:18     Assessment/Plan   1. Acute pancreatitis  - Continue bowel rest, IVF hydration, and pain-control for now pending further recommendations from GI   2. CAD  - No anginal symptoms  - Continue ASA 81   3. PAF  - Not anticoagulated or on rate-control agents pta    4. Type II DM  - Diet-controlled at home  - Monitor CBGs and use low-intensity SSI if needed    DVT prophylaxis: Lovenox  Code Status: Full  Level of Care: Level of care: Med-Surg Family Communication: Wife at bedside   Disposition Plan:  Patient is from: home  Anticipated d/c is to: TBD Anticipated d/c date is: 01/31/23  Patient currently: Pending pain-control, tolerance of adequate oral intake  Consults called: GI  Admission status: Inpatient     Briscoe Deutscher, MD [facility]  01/28/2023, 9:03 PM

## 2023-01-28 NOTE — ED Notes (Signed)
ED TO INPATIENT HANDOFF REPORT  ED Nurse Name and Phone #: Kewanna Kasprzak, RN [phone]  S Name/Age/Gender Stefani Dama 82 y.o. male Room/Bed: 015C/015C  Code Status   Code Status: Full Code  Home/SNF/Other Home Patient oriented to: self, place, time, and situation Is this baseline? Yes   Triage Complete: Triage complete  Chief Complaint Acute pancreatitis [K85.90]  Triage Note Pt's GI dr sent pt here for dehydration. Pt states nauseated today. Pt states he's just not able to drink much fluids, because his abdomen feels bloated when he drinks. PT denies vomiting.   Allergies No Known Allergies  Level of Care/Admitting Diagnosis ED Disposition     ED Disposition  Admit   Condition  --   Comment  Hospital Area: [facility] [[id number]]  Level of Care: Med-Surg [16]  May admit patient to [facility] or [facility] if equivalent level of care is available:: Yes  Covid Evaluation: Asymptomatic - no recent exposure (last 10 days) testing not required  Diagnosis: Acute pancreatitis [577.0.ICD-9-CM]  Admitting Physician: Briscoe Deutscher [[id number]]  Attending Physician: Briscoe Deutscher [[id number]]  Certification:: I certify this patient will need inpatient services for at least 2 midnights  Estimated Length of Stay: 3          B Medical/Surgery History Past Medical History:  Diagnosis Date   Anginal pain (HCC)    Aortic atherosclerosis (HCC)    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   Basal cell carcinoma of skin    a.) s/p Mohs procedure R/L nasal ala 01/27/2015   CAD (coronary artery disease) 05/12/2013   a.) STEMI --> LHC 05/12/2013: 20% p-mLAD, 20% D2, 40% LPDA, 30% LPAV, 99% dLCx --> PCI placing 4.5 x 20 mm and 2.5 x 12 mm Veriflex BMS to LPAV extending to dLCx. b.) cCTA 12/27/2017: Ca score 1578; 82nd percentile for age/sex match control; CT-FFR: RCA 0.99, pLAD 0.97, mLAD 0.98, dLAD 0.89, pLCx 0.98, mLCx 0.97, dLCx 0.97,  PLB 0.91, PDA (most distal segment) 0.80.   Cataract    a.) s/p extraction with IOL placement   CKD (chronic kidney disease)    Diverticulosis    GERD (gastroesophageal reflux disease)    History of kidney stones 2023   HLD (hyperlipidemia)    Left lower lobe pulmonary nodule 08/01/2021   a.) CT 08/01/2021: measured 4 mm; perifissural   Long term current use of anticoagulant    a.) apixaban   NSVT (nonsustained ventricular tachycardia) (HCC) 05/13/2013   a.) 24 beat run while in ICU following PCI for STEMI   PAF (paroxysmal atrial fibrillation) (HCC) 11/16/2021   a.) new onset in ED on 11/16/2021. b.) CHA2DS2-VASc = 4 (age x 2, STEMI/aortic plaque, T2DM). c.) rate controlled without pharmacological intervention; started on apixaban 11/16/2021, however PCP discontinued on 11/18/2021 pending Holter study results.   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   Type 2 diabetes, diet controlled [facility])    Past Surgical History:  Procedure Laterality Date   BIOPSY  12/02/2022   Procedure: BIOPSY;  Surgeon: Meridee Score Netty Starring., MD;  Location: [facility] ENDOSCOPY;  Service: Gastroenterology;;   CATARACT EXTRACTION W/ INTRAOCULAR LENS IMPLANT Bilateral    CHOLECYSTECTOMY N/A 12/03/2022  Procedure: LAPAROSCOPIC CHOLECYSTECTOMY;  Surgeon: [name], MD;  Location: [facility] OR;  Service: General;  Laterality: N/A;   COLONOSCOPY N/A 01/31/2001   COLONOSCOPY N/A 11/19/2008   CORONARY ANGIOPLASTY WITH STENT PLACEMENT Left 05/12/2013   Procedure: CORONARY ANGIOPLASTY WITH STENT PLACEMENT; Location: [facility]; Surgeon: Lieutenant Diego, MD   ENDOSCOPIC RETROGRADE CHOLANGIOPANCREATOGRAPHY (ERCP) WITH PROPOFOL N/A 11/22/2022   Procedure: ENDOSCOPIC RETROGRADE CHOLANGIOPANCREATOGRAPHY (ERCP) WITH  PROPOFOL;  Surgeon: Iva Boop, MD;  Location: [facility] ENDOSCOPY;  Service: Gastroenterology;  Laterality: N/A;   ERCP N/A 12/02/2022   Procedure: ENDOSCOPIC RETROGRADE CHOLANGIOPANCREATOGRAPHY (ERCP);  Surgeon: [name]., MD;  Location: [facility] ENDOSCOPY;  Service: Gastroenterology;  Laterality: N/A;   ESOPHAGOGASTRODUODENOSCOPY N/A 03/03/2000   ESOPHAGOGASTRODUODENOSCOPY (EGD) WITH PROPOFOL N/A 10/16/2021   Procedure: ESOPHAGOGASTRODUODENOSCOPY (EGD) WITH PROPOFOL;  Surgeon: Regis Bill, MD;  Location: [facility] ENDOSCOPY;  Service: Endoscopy;  Laterality: N/A;   EXTRACORPOREAL SHOCK WAVE LITHOTRIPSY Left 08/06/2021   Procedure: EXTRACORPOREAL SHOCK WAVE LITHOTRIPSY (ESWL);  Surgeon: Vanna Scotland, MD;  Location: [facility] ORS;  Service: Urology;  Laterality: Left;   INGUINAL HERNIA REPAIR Right 11/27/2021   Procedure: HERNIA REPAIR INGUINAL ADULT;  Surgeon: Earline Mayotte, MD;  Location: [facility] ORS;  Service: General;  Laterality: Right;   INTRAOCULAR LENS EXCHANGE Left 12/27/2018   Procedure: MECHANICAL VITRECTOMY,  EXCHANGE LENS PROSTHESIS VIA PARS PLANA APPROACH; Location: [facility]; Surgeon: Nicholaus Corolla, MD   MOHS SURGERY Bilateral 01/27/2015   Procedure: MOHS SURGERY TO REMOVE BASAL CELL CARCINOMA FROM RIGHT/LEFT NASAL ALA; Location: [facility]; Surgeon: Katrine Coho, MD   REMOVAL OF STONES  12/02/2022   Procedure: REMOVAL OF STONES;  Surgeon: Meridee Score Netty Starring., MD;  Location: [facility] ENDOSCOPY;  Service: Gastroenterology;;   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   Procedure: RPR RETINAL DTCHMNT W/VITRECTOMY ANY METH REPAIR RETINAL DETACHMENT; WITH VITRECTOMY, INC, WHEN PERFORMED, AIR/GAS TAMPONADE, FOCAL ENDOLASER PHOTOCOAGULATION, CRYOTHERAPY, DRAINAGE OF SUBRETINAL FLUID, SCLERAL BUCKLING AND/OR REMOVAL OF LENS; Location: [facility]; Surgeon: Nicholaus Corolla, MD   SINUSOTOMY N/A    Procedure: MAXILLARY SINUSOTOMY INTRANASAL   SPHINCTEROTOMY  12/02/2022   Procedure: SPHINCTEROTOMY;  Surgeon:  Mansouraty, Netty Starring., MD;  Location: [facility] ENDOSCOPY;  Service: Gastroenterology;;   TONSILLECTOMY AND ADENOIDECTOMY N/A      A IV Location/Drains/Wounds Patient Lines/Drains/Airways Status     Active Line/Drains/Airways     Name Placement date Placement time Site Days   Peripheral IV 01/28/23 20 G Right Antecubital 01/28/23  1844  Antecubital  less than 1   Incision - 4 Ports Abdomen 1: Mid;Upper 2: Umbilicus 3: Right 4: Right;Lateral 12/03/22  0940  -- 56            Intake/Output Last 24 hours No intake or output data in the 24 hours ending 01/28/23 2107  Labs/Imaging Results for orders placed or performed during the hospital encounter of 01/28/23 (from the past 48 hour(s))  Urinalysis, Routine w reflex microscopic -Urine, Bag (ped)     Status: Abnormal   Collection Time: 01/28/23  1:12 PM  Result Value Ref Range   Color, Urine AMBER (A) YELLOW    Comment: BIOCHEMICALS MAY BE AFFECTED BY COLOR   APPearance HAZY (A) CLEAR   Specific Gravity, Urine 1.031 (H) 1.005 - 1.030   pH 5.0 5.0 - 8.0   Glucose, UA NEGATIVE NEGATIVE mg/dL   Hgb urine dipstick NEGATIVE NEGATIVE   Bilirubin Urine NEGATIVE NEGATIVE   Ketones, ur NEGATIVE NEGATIVE mg/dL   Protein, ur 657 (A) NEGATIVE mg/dL   Nitrite NEGATIVE NEGATIVE  Leukocytes,Ua NEGATIVE NEGATIVE   RBC / HPF 0-5 0 - 5 RBC/hpf   WBC, UA 0-5 0 - 5 WBC/hpf   Bacteria, UA RARE (A) NONE SEEN   Squamous Epithelial / HPF 0-5 0 - 5 /HPF   Mucus PRESENT     Comment: Performed at [facility], [address], [place], [place] [address]  CBC with Differential     Status: Abnormal   Collection Time: 01/28/23  1:21 PM  Result Value Ref Range   WBC 17.2 (H) 4.0 - 10.5 K/uL   RBC 4.13 (L) 4.22 - 5.81 MIL/uL   Hemoglobin 11.8 (L) 13.0 - 17.0 g/dL   HCT 25.9 (L) 56.3 - 87.5 %   MCV 89.3 80.0 - 100.0 fL   MCH 28.6 26.0 - 34.0 pg   MCHC 32.0 30.0 - 36.0 g/dL   RDW 64.3 32.9 - 51.8 %   Platelets 235 150 - 400 K/uL   nRBC 0.0 0.0 -  0.2 %   Neutrophils Relative % 77 %   Neutro Abs 13.2 (H) 1.7 - 7.7 K/uL   Lymphocytes Relative 14 %   Lymphs Abs 2.4 0.7 - 4.0 K/uL   Monocytes Relative 8 %   Monocytes Absolute 1.4 (H) 0.1 - 1.0 K/uL   Eosinophils Relative 0 %   Eosinophils Absolute 0.1 0.0 - 0.5 K/uL   Basophils Relative 0 %   Basophils Absolute 0.1 0.0 - 0.1 K/uL   Immature Granulocytes 1 %   Abs Immature Granulocytes 0.08 (H) 0.00 - 0.07 K/uL    Comment: Performed at [facility], [address] [place], [place] [address]  Comprehensive metabolic panel     Status: Abnormal   Collection Time: 01/28/23  1:21 PM  Result Value Ref Range   Sodium 137 135 - 145 mmol/L   Potassium 3.7 3.5 - 5.1 mmol/L   Chloride 104 98 - 111 mmol/L   CO2 23 22 - 32 mmol/L   Glucose, Bld 148 (H) 70 - 99 mg/dL    Comment: Glucose reference range applies only to samples taken after fasting for at least 8 hours.   BUN 30 (H) 8 - 23 mg/dL   Creatinine, Ser 0.63 0.61 - 1.24 mg/dL   Calcium 8.5 (L) 8.9 - 10.3 mg/dL   Total Protein 7.0 6.5 - 8.1 g/dL   Albumin 3.0 (L) 3.5 - 5.0 g/dL   AST 34 15 - 41 U/L   ALT 49 (H) 0 - 44 U/L   Alkaline Phosphatase 71 38 - 126 U/L   Total Bilirubin 2.8 (H) 0.3 - 1.2 mg/dL   GFR, Estimated >01 >60 mL/min    Comment: (NOTE) Calculated using the CKD-EPI Creatinine Equation (2021)    Anion gap 10 5 - 15    Comment: Performed at [facility], [address], [place], [place] [address]  Lipase, blood     Status: Abnormal   Collection Time: 01/28/23  1:21 PM  Result Value Ref Range   Lipase 65 (H) 11 - 51 U/L    Comment: Performed at [facility], [address], [place], [place] [address]  Amylase     Status: Abnormal   Collection Time: 01/28/23  6:51 PM  Result Value Ref Range   Amylase 351 (H) 28 - 100 U/L    Comment: Performed at [facility], [address], [place], [place] [address]  Sedimentation rate     Status: Abnormal   Collection  Time: 01/28/23  6:51 PM  Result  Value Ref Range   Sed Rate 67 (H) 0 - 16 mm/hr    Comment: Performed at [facility], [address], [place], [place] [address]  C-reactive protein     Status: Abnormal   Collection Time: 01/28/23  6:51 PM  Result Value Ref Range   CRP 19.3 (H) <1.0 mg/dL    Comment: Performed at [facility], [address] [place], [place] [address]   CT ABDOMEN PELVIS W CONTRAST  Result Date: 01/28/2023 CLINICAL DATA:  Severe acute pancreatitis EXAM: CT ABDOMEN AND PELVIS WITH CONTRAST TECHNIQUE: Multidetector CT imaging of the abdomen and pelvis was performed using the standard protocol following bolus administration of intravenous contrast. RADIATION DOSE REDUCTION: This exam was performed according to the departmental dose-optimization program which includes automated exposure control, adjustment of the mA and/or kV according to patient size and/or use of iterative reconstruction technique. CONTRAST:  75mL OMNIPAQUE IOHEXOL 350 MG/ML SOLN COMPARISON:  Previous studies including the examination of 01/24/2023 FINDINGS: Lower chest: Visualized lower lung fields are clear. Coronary artery calcifications are seen. Hepatobiliary: Surgical clips are seen in gallbladder fossa. There are pockets of air in intrahepatic bile ducts. Bile ducts are not dilated. Pancreas: There is enlargement of the head of the pancreas. There are multiple loculated fluid collections in and adjacent to head and proximal body of pancreas. There is interval increase in number and size of the loculated fluid collections in and around head of the pancreas due to severe acute pancreatitis. Largest of these collections measures 4.9 x 3.5 cm in the anterior aspect of the head. There are multiple other smaller loculated fluid collections. Overall size body and tail of pancreas has not changed. There is homogeneous enhancement in body and tail of pancreas. There is 2.2 cm low-density in the anterior margin of tail of pancreas with no  significant change. Spleen: Unremarkable. Adrenals/Urinary Tract: Adrenals are unremarkable. There is no hydronephrosis. There are no renal or ureteral stones. Urinary bladder is not distended. Stomach/Bowel: There is wall thickening in the antrum of the stomach and duodenum. Rest of the small bowel loops are unremarkable. The appendix is not dilated. There is no significant wall thickening in colon. Multiple diverticula are seen in the colon, especially in sigmoid with no evidence of focal acute diverticulitis. Vascular/Lymphatic: Calcifications are seen in aorta and its major branches. Reproductive: Unremarkable. Other: There is no ascites or pneumoperitoneum. Umbilical hernia containing fat is seen. Right inguinal hernia containing fat is seen. Possible small left inguinal hernia containing fat is seen. Musculoskeletal: Degenerative changes are noted in lumbar spine, more severe at L2-L3 level. There is encroachment of neural foramina at multiple levels. IMPRESSION: Severe acute pancreatitis in the head of the pancreas. There is interval increase in number and size of multiple loculated fluid collections in and around head of the pancreas suggesting interval worsening of acute pancreatitis with possible multiple pseudocysts or foci of pancreatic necrosis. Largest of these fluid collections measures 4.9 x 3.5 cm. There are no pockets of air within these fluid collections. There is a 2.2 cm low-density in the tail of pancreas with no interval change. There is no definite demonstrable new focal pancreatic necrosis in the body and tail. There is wall thickening in the distal antrum of the stomach and the duodenum, possibly gastritis and duodenitis There is no evidence of intestinal obstruction or pneumoperitoneum. Appendix is not dilated. There is no hydronephrosis. Diverticulosis of colon without signs  of focal diverticulitis. Aortic arteriosclerosis. Coronary artery disease. Lumbar spondylosis. Electronically  Signed   By: Ernie Avena M.D.   On: 01/28/2023 20:18    Pending Labs Unresulted Labs (From admission, onward)     Start     Ordered   02/04/23 0500  Creatinine, serum  (enoxaparin (LOVENOX)    CrCl >/= 30 ml/min)  Weekly,   R     Comments: while on enoxaparin therapy    01/28/23 2103   01/29/23 0500  Comprehensive metabolic panel  Daily,   R      01/28/23 2103   01/29/23 0500  Magnesium  Tomorrow morning,   R        01/28/23 2103   01/29/23 0500  CBC  Daily,   R      01/28/23 2103            Vitals/Pain Today's Vitals   01/28/23 1830 01/28/23 1957 01/28/23 1958 01/28/23 1958  BP: 117/65 129/76    Pulse: 71 75 75   Resp:  16    Temp:      TempSrc:      SpO2: 98% 100% 100%   Weight:      Height:      PainSc:    0-No pain    Isolation Precautions No active isolations  Medications Medications  aspirin chewable tablet 81 mg (has no administration in time range)  famotidine (PEPCID) tablet 40 mg (has no administration in time range)  pantoprazole (PROTONIX) EC tablet 40 mg (has no administration in time range)  enoxaparin (LOVENOX) injection 40 mg (has no administration in time range)  0.9 %  sodium chloride infusion (has no administration in time range)  acetaminophen (TYLENOL) tablet 650 mg (has no administration in time range)    Or  acetaminophen (TYLENOL) suppository 650 mg (has no administration in time range)  HYDROmorphone (DILAUDID) injection 0.5 mg (has no administration in time range)  ondansetron (ZOFRAN) tablet 4 mg (has no administration in time range)    Or  ondansetron (ZOFRAN) injection 4 mg (has no administration in time range)  lactated ringers bolus 1,000 mL (1,000 mLs Intravenous New Bag/Given 01/28/23 1848)  ondansetron (ZOFRAN) injection 4 mg (4 mg Intravenous Given 01/28/23 1844)  morphine (PF) 4 MG/ML injection 4 mg (4 mg Intravenous Given 01/28/23 1845)  iohexol (OMNIPAQUE) 350 MG/ML injection 75 mL (75 mLs Intravenous Contrast Given  01/28/23 1958)    Mobility walks     Focused Assessments    R Recommendations: See Admitting Provider Note  Report given to:   Additional Notes: Patient is A&Ox4, very nice, no complaints at this time. Recently hospitalized for same and went home, now back. Wife was at bedside and was very helpful and sweet.

## 2023-01-28 NOTE — Consult Note (Incomplete)
Consultation  Referring Provider:  [facility]  Primary Care Physician:  Danella Penton, MD Primary Gastroenterologist:  Dr. Myrtie Neither       Reason for Consultation:       LOS: 0 days          HPI:   Jon Bray is a 82 y.o. male with past medical history significant for diabetes, hypertension, CAD with prior MI and stent 2014, A-fib, presents  Interval history ---->09/2022: patient seen by PCP for weight loss and elevated LFTs. CT ab/pelvis showed focal wall thickening of urinary bladder and UVJ suspicious neoplasm and extensive diverticulosis. MRCP done which showed choledocholithiasis with mild intra and extrahepatic biliary ductal dilation with CBD measuring 0.9 cm.  Patient was recommended to go to emergency department and he declined. Set up to f/u outpatient ---> 10/2022:  general surgery recommended cholecystectomy, patient declined ----> 11/2022: Patient admitted for nausea and vomiting.  Lipase of 10,000.  MRI showing acute pancreatitis with choledocholithiasis.  Recent admission 5/15 to 12/10/2022 for acute gallstone pancreatitis with hemorrhage and necrosis with mass effect of the distal common bile duct.  ERCP 5/15 attempted but aborted due to abnormal anatomy.  Follow-up MRCP showed persistent small stone in CBD.  Underwent ERCP 12/02/2022 (see below).  Post lap chole 12/03/2022.  Symptoms improved and he was able to tolerate diet.  Was discharged on Augmentin twice daily for 7 days and follow-up with GI for repeat CT  Repeat CT scan 12/29/2022 showed some interval improvement.  Dr. Laurita Quint reported pancreatic necrosectomy was not indicated yet.  Another repeat CT scan 7/24 which showed no significant change in peripancreatic fluid collections.  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  No acute pancreatic hemorrhage.  Sigmoid diverticulosis without inflammation.  Mildly prominent stool throughout colon.   Was recommended  repeat CT scan in 6 weeks.  Patient then began having nausea, vomiting, pain and weakness.  Labs 01/26/23 -Lipase 1258 -Amylase 902 -No leukocytosis  Patient was then brought to emergency department for further evaluation  Labs today Leukocytosis with WBC 17.2 Lipase 65 AST 34/ALT 49/alk phos 71 Total bilirubin 2.8 BUN 30    {ACfamilyinroom:28877}    PREVIOUS GI WORKUP---------------------------------------------------  ERCP 11/22/22 The scout film was normal. Esophagus not seen well. Stomach grossly normal. Duodenum w/ edema, especially medial wall. Could not clearly ID papilla. Area seen and suspected but no papillay orifice found, despite using the sphincterotome to move folds and changed from Exalt to Olympus duodenoscope. There was a small amount of bile seen but again, no papillary orifice identifed.  ERCP 12/02/2022 - J- shaped gastric deformity.  - Gastritis in antrum. No other gross lesions in the entire stomach. Biopsied for H. pylori.  - No gross lesions in the duodenal bulb, in the first portion of the duodenum and in the second portion of the duodenum.  - The major papilla appeared normal ( though hidden under multiple folds) .  - The fluoroscopic examination was suspicious for sludge.  - Choledocholithiasis was found. Complete removal was accomplished by biliary sphincterotomy and balloon sweeping.  - The very distal biliary duct had a slight narrowing compared to the majority of the bile duct, though this is not clearly a stricture and the balloon could not pass through this area with ease after stone extraction.  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   Basal cell carcinoma of skin    a.) s/p Mohs procedure R/L nasal ala 01/27/2015   CAD (coronary artery disease) 05/12/2013   a.) STEMI --> LHC 05/12/2013: 20% p-mLAD, 20%  D2, 40% LPDA, 30% LPAV, 99% dLCx --> PCI placing 4.5 x 20 mm and 2.5 x 12 mm Veriflex BMS to LPAV extending to dLCx. b.) cCTA 12/27/2017: Ca score 1578; 82nd percentile for age/sex match control; CT-FFR: RCA 0.99, pLAD 0.97, mLAD 0.98, dLAD 0.89, pLCx 0.98, mLCx 0.97, dLCx 0.97, PLB 0.91, PDA (most distal segment) 0.80.   Cataract    a.) s/p extraction with IOL placement   CKD (chronic kidney disease)    Diverticulosis    GERD (gastroesophageal reflux disease)    History of kidney stones 2023   HLD (hyperlipidemia)    Left lower lobe pulmonary nodule 08/01/2021   a.) CT 08/01/2021: measured 4 mm; perifissural   Long term current use of anticoagulant    a.) apixaban   NSVT (nonsustained ventricular tachycardia) (HCC) 05/13/2013   a.) 24 beat run while in ICU following PCI for STEMI   PAF (paroxysmal atrial fibrillation) (HCC) 11/16/2021   a.) new onset in ED on 11/16/2021. b.) CHA2DS2-VASc = 4 (age x 2, STEMI/aortic plaque, T2DM). c.) rate controlled without pharmacological intervention; started on apixaban 11/16/2021, however PCP discontinued on 11/18/2021 pending Holter study results.   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   Type 2 diabetes, diet controlled (HCC)     Surgical History:  He  has a past surgical history that includes Coronary angioplasty with stent (Left, 05/12/2013); Extracorporeal shock wave lithotripsy (Left, 08/06/2021); Esophagogastroduodenoscopy (egd) with propofol (N/A, 10/16/2021); Esophagogastroduodenoscopy (N/A, 03/03/2000); Colonoscopy (N/A, 01/31/2001); Colonoscopy (N/A, 11/19/2008); Retinal detachment surgery (Left, 01/13/2019); Sinusotomy (N/A); Mohs surgery (Bilateral, 01/27/2015); Tonsillectomy and adenoidectomy (N/A); Cataract extraction  w/ intraocular lens implant (Bilateral); Intraocular lens exchange (Left, 12/27/2018); Inguinal hernia repair (Right, 11/27/2021); Endoscopic retrograde cholangiopancreatography (ercp) with propofol (N/A, 11/22/2022); Cholecystectomy (N/A, 12/03/2022); ERCP (N/A, 12/02/2022); sphincterotomy (12/02/2022); removal of stones (12/02/2022); and biopsy (12/02/2022). Family History:  His family history is not on file. Social History:   reports that he has never smoked. He has never used smokeless tobacco. He reports that he does not drink alcohol and does not use drugs.  Prior to Admission medications   Medication Sig Start Date End Date Taking? Authorizing Provider  aspirin 81 MG chewable tablet Chew 81 mg by mouth daily.    [provider]  bisacodyl (DULCOLAX) 5 MG EC tablet Take 5 mg by mouth daily as needed for moderate constipation. Patient not taking: Reported on 03/09/2022    [provider]  cholestyramine light (PREVALITE) 4 g packet Take 0.5 packets (2 g total) by mouth 2 (two) times daily. 12/10/22 01/09/23  Pahwani, Kasandra Knudsen, MD  famotidine (PEPCID) 40 MG tablet Take 40 mg by mouth at bedtime.    [provider]  feeding supplement (ENSURE ENLIVE / ENSURE PLUS) LIQD Take 237 mLs by mouth 3 (three) times daily between meals. 12/10/22   Pahwani, Kasandra Knudsen, MD  Lactobacillus (PROBIOTIC ACIDOPHILUS PO) Take 1 tablet by mouth daily.    [provider]  Multiple Vitamin (  MULTIVITAMIN) tablet Take 1 tablet by mouth daily.    [provider]  pantoprazole (PROTONIX) 40 MG tablet Take 40 mg by mouth daily.    [provider]  sodium chloride (MURO 128) 5 % ophthalmic ointment Place 1 Application into both eyes at bedtime.    [provider]  Zinc Sulfate (ZINC-220 PO) Take 1 tablet by mouth daily.    [provider]    No current facility-administered medications for this encounter.   Current Outpatient Medications  Medication Sig Dispense  Refill   aspirin 81 MG chewable tablet Chew 81 mg by mouth daily.     bisacodyl (DULCOLAX) 5 MG EC tablet Take 5 mg by mouth daily as needed for moderate constipation. (Patient not taking: Reported on 03/09/2022)     cholestyramine light (PREVALITE) 4 g packet Take 0.5 packets (2 g total) by mouth 2 (two) times daily. 30 packet 0   famotidine (PEPCID) 40 MG tablet Take 40 mg by mouth at bedtime.     feeding supplement (ENSURE ENLIVE / ENSURE PLUS) LIQD Take 237 mLs by mouth 3 (three) times daily between meals. 237 mL 12   Lactobacillus (PROBIOTIC ACIDOPHILUS PO) Take 1 tablet by mouth daily.     Multiple Vitamin (MULTIVITAMIN) tablet Take 1 tablet by mouth daily.     pantoprazole (PROTONIX) 40 MG tablet Take 40 mg by mouth daily.     sodium chloride (MURO 128) 5 % ophthalmic ointment Place 1 Application into both eyes at bedtime.     Zinc Sulfate (ZINC-220 PO) Take 1 tablet by mouth daily.      Allergies as of 01/28/2023   (No Known Allergies)    ROS     Physical Exam:  Vital signs in last 24 hours: Temp:  [97.6 F (36.4 C)] 97.6 F (36.4 C) (07/26 1256) Pulse Rate:  [92] 92 (07/26 1256) Resp:  [16] 16 (07/26 1256) BP: (114)/(72) 114/72 (07/26 1256) SpO2:  [99 %] 99 % (07/26 1256) Weight:  [73.2 kg] 73.2 kg (07/26 1306)   Last BM recorded by nurses in past 5 days No data recorded  Physical Exam   LAB RESULTS: Recent Labs    01/26/23 0941 01/28/23 1321  WBC 9.5 17.2*  HGB 12.3* 11.8*  HCT 38.1* 36.9*  PLT 266.0 235   BMET Recent Labs    01/26/23 0941 01/28/23 1321  NA 140 137  K 4.1 3.7  CL 106 104  CO2 25 23  GLUCOSE 111* 148*  BUN 23 30*  CREATININE 0.89 0.93  CALCIUM 9.0 8.5*   LFT Recent Labs    01/28/23 1321  PROT 7.0  ALBUMIN 3.0*  AST 34  ALT 49*  ALKPHOS 71  BILITOT 2.8*   PT/INR No results for input(s): "LABPROT", "INR" in the last 72 hours.  STUDIES: No results found.    Impression    ***  Active Problems:   * No active hospital  problems. *      Plan   ***  Thank you for your kind consultation, we will continue to follow.   Haydan Wedig Leanna Sato  01/28/2023, 2:57 PM

## 2023-01-28 NOTE — ED Triage Notes (Signed)
Pt's GI dr sent pt here for dehydration. Pt states nauseated today. Pt states he's just not able to drink much fluids, because his abdomen feels bloated when he drinks. PT denies vomiting.

## 2023-01-28 NOTE — Telephone Encounter (Signed)
[name] is calling and said patient is sicker today.  She is thinking about taking him to [facility] to be seen.  Requesting a nurse to call back

## 2023-01-28 NOTE — ED Provider Notes (Signed)
[place] EMERGENCY DEPARTMENT AT [facility] Provider Note  MDM   HPI/ROS:  Jon Bray is a 82 y.o. male with a medical history as below significant for diabetes, hypertension, CAD status post stent placement in 2014, A-fib, chronic pancreatitis, who presents with complaint of abdominal pain, inability to take p.o. has been ongoing for several days.  He was hospitalized for acute pancreatitis and discharged approximately a month ago after he was found to have a necrotizing pancreatitis with choledocholithiasis.  In the interim he has had multiple CT scans and visits GI and continues to have symptoms.  Labs 2 days ago showed a lipase of 1200, and he has had difficulty with taking p.o. since then.  He did have a CT scan 4 days ago that showed areas of fluid collections without any evidence of pancreatic hemorrhage or significant evidence of any new pancreatitis.  Underwent a lap chole 5/31.  He has some mild tenderness to palpation in the epigastric region but otherwise is fairly well-appearing.  He has not been able to take any p.o. today.  Obvious concern for return of a necrotizing pancreatitis, ruptured pseudo cyst.  Labs that were obtained in triage show that his lipase is significantly down, will add an amylase to ensure that both are trending in the same direction, and give fluid resuscitation, repeat CT, GI consult.  Serial reevaluation.  Interpretations, interventions, and the patient's course of care are documented below.    Clinical Course as of 01/29/23 0108  Fri Jan 28, 2023  1904 Lipase(!): 65 [BB]    Clinical Course User Index [BB] Fayrene Helper, MD    Workup here is concerning for severe acute pancreatitis with concern for necrosis.  Reach out to GI and patient ultimately admitted to the hospitalist for further care.  Disposition: Admit    Clinical Impression:  1. Acute pancreatitis with infected necrosis, unspecified pancreatitis type     Rx / DC Orders ED  Discharge Orders     None       The plan for this patient was discussed with Dr. Silverio Lay, who voiced agreement and who oversaw evaluation and treatment of this patient.   Clinical Complexity A medically appropriate history, review of systems, and physical exam was performed.  My independent interpretations of EKG, labs, and radiology are documented in the ED course above.   If decision rules were used in this patient's evaluation, they are listed below.   Click here for ABCD2, HEART and other calculatorsREFRESH Note before signing   Patient's presentation is most consistent with acute presentation with potential threat to life or bodily function.  Medical Decision Making Amount and/or Complexity of Data Reviewed Labs: ordered. Decision-making details documented in ED Course. Radiology: ordered.  Risk Prescription drug management. Decision regarding hospitalization.    HPI/ROS      See MDM section for pertinent HPI and ROS. A complete ROS was performed with pertinent positives/negatives noted above.   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   Basal cell carcinoma of skin    a.) s/p Mohs procedure R/L nasal ala 01/27/2015   CAD (coronary artery disease) 05/12/2013   a.) STEMI --> LHC 05/12/2013: 20% p-mLAD, 20% D2, 40% LPDA, 30% LPAV, 99% dLCx --> PCI placing 4.5 x 20 mm and 2.5 x 12 mm Veriflex BMS to LPAV extending to dLCx.  b.) cCTA 12/27/2017: Ca score 1578; 82nd percentile for age/sex match control; CT-FFR: RCA 0.99, pLAD 0.97, mLAD 0.98, dLAD 0.89, pLCx 0.98, mLCx 0.97, dLCx 0.97, PLB 0.91, PDA (most distal segment) 0.80.   Cataract    a.) s/p extraction with IOL placement   CKD (chronic kidney disease)    Diverticulosis    GERD (gastroesophageal reflux disease)    History of kidney stones 2023   HLD (hyperlipidemia)    Left lower  lobe pulmonary nodule 08/01/2021   a.) CT 08/01/2021: measured 4 mm; perifissural   Long term current use of anticoagulant    a.) apixaban   NSVT (nonsustained ventricular tachycardia) (HCC) 05/13/2013   a.) 24 beat run while in ICU following PCI for STEMI   PAF (paroxysmal atrial fibrillation) (HCC) 11/16/2021   a.) new onset in ED on 11/16/2021. b.) CHA2DS2-VASc = 4 (age x 2, STEMI/aortic plaque, T2DM). c.) rate controlled without pharmacological intervention; started on apixaban 11/16/2021, however PCP discontinued on 11/18/2021 pending Holter study results.   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   Type 2 diabetes, diet controlled [facility])     Past Surgical History:  Procedure Laterality Date   BIOPSY  12/02/2022   Procedure: BIOPSY;  Surgeon: Meridee Score Netty Starring., MD;  Location: [facility] ENDOSCOPY;  Service: Gastroenterology;;   CATARACT EXTRACTION W/ INTRAOCULAR LENS IMPLANT Bilateral    CHOLECYSTECTOMY N/A 12/03/2022   Procedure: LAPAROSCOPIC CHOLECYSTECTOMY;  Surgeon: [name], MD;  Location: [facility] OR;  Service: General;  Laterality: N/A;   COLONOSCOPY N/A 01/31/2001   COLONOSCOPY N/A 11/19/2008   CORONARY ANGIOPLASTY WITH STENT PLACEMENT Left 05/12/2013   Procedure: CORONARY ANGIOPLASTY WITH STENT PLACEMENT; Location: [facility]; Surgeon: Lieutenant Diego, MD   ENDOSCOPIC RETROGRADE CHOLANGIOPANCREATOGRAPHY (ERCP) WITH PROPOFOL N/A 11/22/2022   Procedure: ENDOSCOPIC RETROGRADE CHOLANGIOPANCREATOGRAPHY (ERCP) WITH PROPOFOL;  Surgeon: Iva Boop, MD;  Location: [facility] ENDOSCOPY;  Service: Gastroenterology;  Laterality: N/A;   ERCP N/A 12/02/2022   Procedure: ENDOSCOPIC RETROGRADE CHOLANGIOPANCREATOGRAPHY (ERCP);  Surgeon: [name]., MD;  Location: [facility]  ENDOSCOPY;  Service: Gastroenterology;  Laterality: N/A;   ESOPHAGOGASTRODUODENOSCOPY N/A 03/03/2000   ESOPHAGOGASTRODUODENOSCOPY (EGD) WITH PROPOFOL N/A 10/16/2021   Procedure: ESOPHAGOGASTRODUODENOSCOPY (EGD) WITH PROPOFOL;  Surgeon: Regis Bill, MD;  Location: [facility] ENDOSCOPY;  Service: Endoscopy;  Laterality: N/A;   EXTRACORPOREAL SHOCK WAVE LITHOTRIPSY Left 08/06/2021   Procedure: EXTRACORPOREAL SHOCK WAVE LITHOTRIPSY (ESWL);  Surgeon: Vanna Scotland, MD;  Location: [facility] ORS;  Service: Urology;  Laterality: Left;   INGUINAL HERNIA REPAIR Right 11/27/2021   Procedure: HERNIA REPAIR INGUINAL ADULT;  Surgeon: Earline Mayotte, MD;  Location: [facility] ORS;  Service: General;  Laterality: Right;   INTRAOCULAR LENS EXCHANGE Left 12/27/2018   Procedure: MECHANICAL VITRECTOMY,  EXCHANGE LENS PROSTHESIS VIA PARS PLANA APPROACH; Location: [facility]; Surgeon: Nicholaus Corolla, MD   MOHS SURGERY Bilateral 01/27/2015   Procedure: MOHS SURGERY TO REMOVE BASAL CELL CARCINOMA FROM RIGHT/LEFT NASAL ALA; Location: [facility]; Surgeon: Katrine Coho, MD   REMOVAL OF STONES  12/02/2022   Procedure: REMOVAL OF STONES;  Surgeon: [name]., MD;  Location: [facility] ENDOSCOPY;  Service: Gastroenterology;;   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   Procedure: RPR RETINAL DTCHMNT W/VITRECTOMY ANY METH REPAIR RETINAL DETACHMENT; WITH VITRECTOMY, INC, WHEN PERFORMED, AIR/GAS  TAMPONADE, FOCAL ENDOLASER PHOTOCOAGULATION, CRYOTHERAPY, DRAINAGE OF SUBRETINAL FLUID, SCLERAL BUCKLING AND/OR REMOVAL OF LENS; Location: [facility]; Surgeon: Nicholaus Corolla, MD   SINUSOTOMY N/A    Procedure: MAXILLARY SINUSOTOMY INTRANASAL   SPHINCTEROTOMY  12/02/2022   Procedure: SPHINCTEROTOMY;  Surgeon: Mansouraty, Netty Starring., MD;  Location: [facility] ENDOSCOPY;  Service: Gastroenterology;;   TONSILLECTOMY AND ADENOIDECTOMY N/A       Physical Exam   Vitals:   01/28/23 2130 01/28/23 2148 01/28/23 2200 01/28/23 2219  BP: (!) 111/94  112/60 129/70  Pulse:  74  69 80  Resp:    18  Temp:  98.3 F (36.8 C)  98.1 F (36.7 C)  TempSrc:  Oral    SpO2: 100%  97% 100%  Weight:      Height:        Physical Exam Vitals and nursing note reviewed.  Constitutional:      General: He is not in acute distress.    Appearance: He is well-developed.  HENT:     Head: Normocephalic and atraumatic.  Eyes:     Conjunctiva/sclera: Conjunctivae normal.  Cardiovascular:     Rate and Rhythm: Normal rate and regular rhythm.     Heart sounds: No murmur heard. Pulmonary:     Effort: Pulmonary effort is normal. No respiratory distress.     Breath sounds: Normal breath sounds.  Abdominal:     General: Abdomen is flat. Bowel sounds are normal.     Palpations: Abdomen is soft.     Tenderness: There is abdominal tenderness in the epigastric area and left upper quadrant. There is guarding.  Musculoskeletal:        General: No swelling.     Cervical back: Neck supple.  Skin:    General: Skin is warm and dry.     Capillary Refill: Capillary refill takes less than 2 seconds.  Neurological:     Mental Status: He is alert.  Psychiatric:        Mood and Affect: Mood normal.      Procedures   If procedures were preformed on this patient, they are listed below:  Procedures   Fayrene Helper, MD Emergency Medicine PGY-2   Please note that this documentation was produced with the assistance of voice-to-text technology and may contain errors.    Fayrene Helper, MD 01/29/23 0109    Charlynne Pander, MD 01/31/23 [phone]

## 2023-01-28 NOTE — Telephone Encounter (Signed)
Inbound call from patient spouse , states patient is niot able to drink the protein shake and eat solid foods as well. States patient has to do one or the other. Requesting a f/u call from a nurse. Please advise.

## 2023-01-28 NOTE — ED Provider Triage Note (Signed)
Emergency Medicine Provider Triage Evaluation Note  Jon Bray , a 82 y.o. male  was evaluated in triage.  Pt complains of weight loss, early satiety and abdominal pain..  Patient is being followed in the outpatient setting by Dr. Meridee Score.  Patient had a CT scan 2 days ago showed peripancreatic fluid collections.  He was admitted 50 days ago for 3 weeks with sounds like choledocholithiasis and cholecystitis.  He had his gallbladder removed and underwent ERCP.  Sent in for potential dehydration.  Review of Systems  Positive: Abdominal pain Negative: Fever  Physical Exam  BP 114/72 (BP Location: Right Arm)   Pulse 92   Temp 97.6 F (36.4 C) (Oral)   Resp 16   Ht 5\' 8"  (1.727 m)   Wt 73.2 kg   SpO2 99%   BMI 24.54 kg/m  Gen:   Awake, no distress   Resp:  Normal effort  MSK:   Moves extremities without difficulty  Other:   TTP abd   Medical Decision Making  Medically screening exam initiated at 1:08 PM.  Appropriate orders placed.  Jon Bray was informed that the remainder of the evaluation will be completed by another provider, this initial triage assessment does not replace that evaluation, and the importance of remaining in the ED until their evaluation is complete.     Arthor Captain, PA-C 01/28/23 1311

## 2023-01-29 DIAGNOSIS — K8591 Acute pancreatitis with uninfected necrosis, unspecified: Secondary | ICD-10-CM | POA: Diagnosis not present

## 2023-01-29 LAB — GLUCOSE, CAPILLARY
Glucose-Capillary: 100 mg/dL — ABNORMAL HIGH (ref 70–99)
Glucose-Capillary: 105 mg/dL — ABNORMAL HIGH (ref 70–99)
Glucose-Capillary: 111 mg/dL — ABNORMAL HIGH (ref 70–99)
Glucose-Capillary: 127 mg/dL — ABNORMAL HIGH (ref 70–99)
Glucose-Capillary: 79 mg/dL (ref 70–99)
Glucose-Capillary: 89 mg/dL (ref 70–99)
Glucose-Capillary: 91 mg/dL (ref 70–99)

## 2023-01-29 MED ORDER — DOCUSATE SODIUM 100 MG PO CAPS
100.0000 mg | ORAL_CAPSULE | Freq: Every day | ORAL | Status: DC
  Administered 2023-01-29 – 2023-02-05 (×7): 100 mg via ORAL
  Filled 2023-01-29 (×9): qty 1

## 2023-01-29 MED ORDER — POLYETHYLENE GLYCOL 3350 17 G PO PACK
17.0000 g | PACK | Freq: Every day | ORAL | Status: DC
  Administered 2023-01-29 – 2023-02-05 (×6): 17 g via ORAL
  Filled 2023-01-29 (×8): qty 1

## 2023-01-29 MED ORDER — SODIUM CHLORIDE 0.9 % IV SOLN
12.5000 mg | Freq: Four times a day (QID) | INTRAVENOUS | Status: DC | PRN
  Administered 2023-01-29 – 2023-01-30 (×3): 12.5 mg via INTRAVENOUS
  Filled 2023-01-29: qty 12.5
  Filled 2023-01-29 (×2): qty 0.5

## 2023-01-29 MED ORDER — DEXTROSE IN LACTATED RINGERS 5 % IV SOLN: INTRAVENOUS | Status: DC

## 2023-01-29 NOTE — Consult Note (Signed)
CONSULT NOTE FOR  GI  Reason for Consult: Acute pancreatitis Referring Physician: [facility]  Stefani Dama HPI: This is an 82 year old male with multiple medical problems admitted for acute pancreatitis.  His symptoms of pancreatitis started on May 14th, 2024.  At that time he was noted to have a gallstone pancreatitis and he underwent a lap chole.  Since that time he continued to experience some form of abdominal pain associated with pancreatitis.  During the first CT scan he was only noted to have an acute uncomplicated pancreatitis.  His MRCP on 5/278/2024 showed a worsening of his pancreatitis with pancreatic necrosis and mass effect on his CBD.  An ERCP was performed on 12/02/2022 and it was positive for choledocholithiasis.  He was ultimately able to be discharged home, but he continues to experience poor PO intake and he had problems with abdominal pain.  There was a slight improvement with his CT scan on 12/24/2022, but his admission CT scan showed a worsening of his pancreatitis.  An increase in fluid collects was identified.  His preadmission lipase was at 1200, but today it is at 65.  He states that since May he will feel well for 3 days and then his symptoms recur.  This was his pattern up to this point.  He is able to tolerate solid foods, but he finds that he cannot tolerate liquids.  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   Basal cell carcinoma of skin    a.) s/p Mohs procedure R/L nasal ala 01/27/2015   CAD (coronary artery disease) 05/12/2013   a.) STEMI --> LHC 05/12/2013: 20% p-mLAD, 20% D2, 40% LPDA, 30% LPAV, 99% dLCx --> PCI placing 4.5 x 20 mm and 2.5 x 12 mm Veriflex BMS to LPAV extending to dLCx. b.) cCTA 12/27/2017: Ca score 1578; 82nd percentile for age/sex match control; CT-FFR: RCA 0.99, pLAD 0.97, mLAD 0.98, dLAD 0.89, pLCx  0.98, mLCx 0.97, dLCx 0.97, PLB 0.91, PDA (most distal segment) 0.80.   Cataract    a.) s/p extraction with IOL placement   CKD (chronic kidney disease)    Diverticulosis    GERD (gastroesophageal reflux disease)    History of kidney stones 2023   HLD (hyperlipidemia)    Left lower lobe pulmonary nodule 08/01/2021   a.) CT 08/01/2021: measured 4 mm; perifissural   Long term current use of anticoagulant    a.) apixaban   NSVT (nonsustained ventricular tachycardia) (HCC) 05/13/2013   a.) 24 beat run while in ICU following PCI for STEMI   PAF (paroxysmal atrial fibrillation) (HCC) 11/16/2021   a.) new onset in ED on 11/16/2021. b.) CHA2DS2-VASc = 4 (age x 2, STEMI/aortic plaque, T2DM). c.) rate controlled without pharmacological intervention; started on apixaban 11/16/2021, however PCP discontinued on 11/18/2021 pending Holter study results.   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   Type 2 diabetes, diet controlled [facility])     Past Surgical History:  Procedure Laterality Date   BIOPSY  12/02/2022   Procedure: BIOPSY;  Surgeon: Meridee Score Netty Starring., MD;  Location: [facility] ENDOSCOPY;  Service:  Gastroenterology;;   CATARACT EXTRACTION W/ INTRAOCULAR LENS IMPLANT Bilateral    CHOLECYSTECTOMY N/A 12/03/2022   Procedure: LAPAROSCOPIC CHOLECYSTECTOMY;  Surgeon: [name], MD;  Location: [facility] OR;  Service: General;  Laterality: N/A;   COLONOSCOPY N/A 01/31/2001   COLONOSCOPY N/A 11/19/2008   CORONARY ANGIOPLASTY WITH STENT PLACEMENT Left 05/12/2013   Procedure: CORONARY ANGIOPLASTY WITH STENT PLACEMENT; Location: [facility]; Surgeon: Lieutenant Diego, MD   ENDOSCOPIC RETROGRADE CHOLANGIOPANCREATOGRAPHY (ERCP) WITH PROPOFOL N/A 11/22/2022   Procedure: ENDOSCOPIC RETROGRADE  CHOLANGIOPANCREATOGRAPHY (ERCP) WITH PROPOFOL;  Surgeon: Iva Boop, MD;  Location: [facility] ENDOSCOPY;  Service: Gastroenterology;  Laterality: N/A;   ERCP N/A 12/02/2022   Procedure: ENDOSCOPIC RETROGRADE CHOLANGIOPANCREATOGRAPHY (ERCP);  Surgeon: [name]., MD;  Location: [facility] ENDOSCOPY;  Service: Gastroenterology;  Laterality: N/A;   ESOPHAGOGASTRODUODENOSCOPY N/A 03/03/2000   ESOPHAGOGASTRODUODENOSCOPY (EGD) WITH PROPOFOL N/A 10/16/2021   Procedure: ESOPHAGOGASTRODUODENOSCOPY (EGD) WITH PROPOFOL;  Surgeon: Regis Bill, MD;  Location: [facility] ENDOSCOPY;  Service: Endoscopy;  Laterality: N/A;   EXTRACORPOREAL SHOCK WAVE LITHOTRIPSY Left 08/06/2021   Procedure: EXTRACORPOREAL SHOCK WAVE LITHOTRIPSY (ESWL);  Surgeon: Vanna Scotland, MD;  Location: [facility] ORS;  Service: Urology;  Laterality: Left;   INGUINAL HERNIA REPAIR Right 11/27/2021   Procedure: HERNIA REPAIR INGUINAL ADULT;  Surgeon: Earline Mayotte, MD;  Location: [facility] ORS;  Service: General;  Laterality: Right;   INTRAOCULAR LENS EXCHANGE Left 12/27/2018   Procedure: MECHANICAL VITRECTOMY,  EXCHANGE LENS PROSTHESIS VIA PARS PLANA APPROACH; Location: [facility]; Surgeon: Nicholaus Corolla, MD   MOHS SURGERY Bilateral 01/27/2015   Procedure: MOHS SURGERY TO REMOVE BASAL CELL CARCINOMA FROM RIGHT/LEFT NASAL ALA; Location: [facility]; Surgeon: Katrine Coho, MD   REMOVAL OF STONES  12/02/2022   Procedure: REMOVAL OF STONES;  Surgeon: Meridee Score Netty Starring., MD;  Location: [facility] ENDOSCOPY;  Service: Gastroenterology;;   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   Procedure: RPR RETINAL DTCHMNT W/VITRECTOMY ANY METH REPAIR RETINAL DETACHMENT; WITH VITRECTOMY, INC, WHEN PERFORMED, AIR/GAS TAMPONADE, FOCAL ENDOLASER PHOTOCOAGULATION, CRYOTHERAPY, DRAINAGE OF SUBRETINAL FLUID, SCLERAL BUCKLING AND/OR REMOVAL OF LENS; Location: [facility]; Surgeon: Nicholaus Corolla, MD   SINUSOTOMY N/A    Procedure: MAXILLARY SINUSOTOMY INTRANASAL   SPHINCTEROTOMY  12/02/2022    Procedure: SPHINCTEROTOMY;  Surgeon: Mansouraty, Netty Starring., MD;  Location: [facility] ENDOSCOPY;  Service: Gastroenterology;;   TONSILLECTOMY AND ADENOIDECTOMY N/A     History reviewed. No pertinent family history.  Social History:  reports that he has never smoked. He has never used smokeless tobacco. He reports that he does not drink alcohol and does not use drugs.  Allergies: No Known Allergies  Medications: Scheduled:  aspirin  81 mg Oral Daily   docusate sodium  100 mg Oral Daily   enoxaparin (LOVENOX) injection  40 mg Subcutaneous Q24H   famotidine  40 mg Oral QHS   insulin aspart  0-6 Units Subcutaneous Q4H   pantoprazole  40 mg Oral Daily   polyethylene glycol  17 g Oral Daily   Continuous:  dextrose 5% lactated ringers      Results for orders placed or performed during the hospital encounter of 01/28/23 (from the past 24 hour(s))  Amylase     Status: Abnormal   Collection Time: 01/28/23  6:51 PM  Result Value Ref Range   Amylase 351 (H) 28 - 100 U/L  Sedimentation rate     Status: Abnormal   Collection Time: 01/28/23  6:51 PM  Result Value Ref Range   Sed Rate 67 (H) 0 - 16 mm/hr  C-reactive protein  Status: Abnormal   Collection Time: 01/28/23  6:51 PM  Result Value Ref Range   CRP 19.3 (H) <1.0 mg/dL  Glucose, capillary     Status: Abnormal   Collection Time: 01/28/23 10:39 PM  Result Value Ref Range   Glucose-Capillary 104 (H) 70 - 99 mg/dL  Glucose, capillary     Status: Abnormal   Collection Time: 01/29/23 12:22 AM  Result Value Ref Range   Glucose-Capillary 111 (H) 70 - 99 mg/dL  Comprehensive metabolic panel     Status: Abnormal   Collection Time: 01/29/23  1:43 AM  Result Value Ref Range   Sodium 135 135 - 145 mmol/L   Potassium 3.4 (L) 3.5 - 5.1 mmol/L   Chloride 105 98 - 111 mmol/L   CO2 21 (L) 22 - 32 mmol/L   Glucose, Bld 93 70 - 99 mg/dL   BUN 23 8 - 23 mg/dL   Creatinine, Ser 3.50 0.61 - 1.24 mg/dL   Calcium 8.0 (L) 8.9 - 10.3 mg/dL   Total  Protein 5.9 (L) 6.5 - 8.1 g/dL   Albumin 2.5 (L) 3.5 - 5.0 g/dL   AST 23 15 - 41 U/L   ALT 35 0 - 44 U/L   Alkaline Phosphatase 61 38 - 126 U/L   Total Bilirubin 1.9 (H) 0.3 - 1.2 mg/dL   GFR, Estimated >09 >38 mL/min   Anion gap 9 5 - 15  Magnesium     Status: None   Collection Time: 01/29/23  1:43 AM  Result Value Ref Range   Magnesium 1.9 1.7 - 2.4 mg/dL  CBC     Status: Abnormal   Collection Time: 01/29/23  1:43 AM  Result Value Ref Range   WBC 12.8 (H) 4.0 - 10.5 K/uL   RBC 3.57 (L) 4.22 - 5.81 MIL/uL   Hemoglobin 10.4 (L) 13.0 - 17.0 g/dL   HCT 18.2 (L) 99.3 - 71.6 %   MCV 88.8 80.0 - 100.0 fL   MCH 29.1 26.0 - 34.0 pg   MCHC 32.8 30.0 - 36.0 g/dL   RDW 96.7 89.3 - 81.0 %   Platelets 175 150 - 400 K/uL   nRBC 0.0 0.0 - 0.2 %  Glucose, capillary     Status: Abnormal   Collection Time: 01/29/23  4:06 AM  Result Value Ref Range   Glucose-Capillary 100 (H) 70 - 99 mg/dL  Glucose, capillary     Status: None   Collection Time: 01/29/23  8:12 AM  Result Value Ref Range   Glucose-Capillary 91 70 - 99 mg/dL   Comment 1 Document in Chart   Glucose, capillary     Status: None   Collection Time: 01/29/23 11:30 AM  Result Value Ref Range   Glucose-Capillary 89 70 - 99 mg/dL   Comment 1 Document in Chart   Glucose, capillary     Status: None   Collection Time: 01/29/23  3:38 PM  Result Value Ref Range   Glucose-Capillary 79 70 - 99 mg/dL     CT ABDOMEN PELVIS W CONTRAST  Result Date: 01/28/2023 CLINICAL DATA:  Severe acute pancreatitis EXAM: CT ABDOMEN AND PELVIS WITH CONTRAST TECHNIQUE: Multidetector CT imaging of the abdomen and pelvis was performed using the standard protocol following bolus administration of intravenous contrast. RADIATION DOSE REDUCTION: This exam was performed according to the departmental dose-optimization program which includes automated exposure control, adjustment of the mA and/or kV according to patient size and/or use of iterative reconstruction  technique. CONTRAST:  75mL OMNIPAQUE  IOHEXOL 350 MG/ML SOLN COMPARISON:  Previous studies including the examination of 01/24/2023 FINDINGS: Lower chest: Visualized lower lung fields are clear. Coronary artery calcifications are seen. Hepatobiliary: Surgical clips are seen in gallbladder fossa. There are pockets of air in intrahepatic bile ducts. Bile ducts are not dilated. Pancreas: There is enlargement of the head of the pancreas. There are multiple loculated fluid collections in and adjacent to head and proximal body of pancreas. There is interval increase in number and size of the loculated fluid collections in and around head of the pancreas due to severe acute pancreatitis. Largest of these collections measures 4.9 x 3.5 cm in the anterior aspect of the head. There are multiple other smaller loculated fluid collections. Overall size body and tail of pancreas has not changed. There is homogeneous enhancement in body and tail of pancreas. There is 2.2 cm low-density in the anterior margin of tail of pancreas with no significant change. Spleen: Unremarkable. Adrenals/Urinary Tract: Adrenals are unremarkable. There is no hydronephrosis. There are no renal or ureteral stones. Urinary bladder is not distended. Stomach/Bowel: There is wall thickening in the antrum of the stomach and duodenum. Rest of the small bowel loops are unremarkable. The appendix is not dilated. There is no significant wall thickening in colon. Multiple diverticula are seen in the colon, especially in sigmoid with no evidence of focal acute diverticulitis. Vascular/Lymphatic: Calcifications are seen in aorta and its major branches. Reproductive: Unremarkable. Other: There is no ascites or pneumoperitoneum. Umbilical hernia containing fat is seen. Right inguinal hernia containing fat is seen. Possible small left inguinal hernia containing fat is seen. Musculoskeletal: Degenerative changes are noted in lumbar spine, more severe at L2-L3 level.  There is encroachment of neural foramina at multiple levels. IMPRESSION: Severe acute pancreatitis in the head of the pancreas. There is interval increase in number and size of multiple loculated fluid collections in and around head of the pancreas suggesting interval worsening of acute pancreatitis with possible multiple pseudocysts or foci of pancreatic necrosis. Largest of these fluid collections measures 4.9 x 3.5 cm. There are no pockets of air within these fluid collections. There is a 2.2 cm low-density in the tail of pancreas with no interval change. There is no definite demonstrable new focal pancreatic necrosis in the body and tail. There is wall thickening in the distal antrum of the stomach and the duodenum, possibly gastritis and duodenitis There is no evidence of intestinal obstruction or pneumoperitoneum. Appendix is not dilated. There is no hydronephrosis. Diverticulosis of colon without signs of focal diverticulitis. Aortic arteriosclerosis. Coronary artery disease. Lumbar spondylosis. Electronically Signed   By: Ernie Avena M.D.   On: 01/28/2023 20:18    ROS:  As stated above in the HPI otherwise negative.  Blood pressure 120/63, pulse 67, temperature 98.7 F (37.1 C), resp. rate 18, height 5\' 8"  (1.727 m), weight 73.2 kg, SpO2 98%.    PE: Gen: NAD, Alert and Oriented HEENT:  [place]/AT, EOMI Neck: Supple, no LAD Lungs: CTA Bilaterally CV: RRR without M/G/R ABD: Soft, mild upper abdominal tenderness, +BS Ext: No C/C/E  Assessment/Plan: 1) Ongoing pancreatitis. 2) Abdominal pain. 3) Anemia.   From his description the patient never recovered from his pancreatitis.  It appears to be persistent and the reason is unknown.  Clinically he looks much better than reported on the scans.  He wanted to try solid foods, but he failed today.  When he ate, this induced vomiting.  Plan: 1) Continue with IV hydration. 2) Continue with  pain control. 3) NPO for now. 4) ? Long term  treatment with core track to allow for the pancreas to recover.  Jaymee Tilson D 01/29/2023, 4:21 PM

## 2023-01-29 NOTE — Progress Notes (Addendum)
[facility] PROGRESS NOTE  QUARON BARRENTINE (DOB: Mar 08, 1941) GUY:[id number] PCP: Danella Penton, MD  Brief Narrative: Jon Bray is an 82 y.o. male with a history of CAD, PAF not anticoagulated, and admission in May 2024 for acute gallstone pancreatitis status post cholecystectomy on 12/03/2022 who presents with abdominal pain and nausea.   Patient feels that he is dehydrated, explaining that he feels full and bloated after eating or drinking only a small amount.  He also continues to have abdominal pain intermittently which can be severe at times.  He had severe nausea today but has not vomited.  He denies any fevers, chills, cough, shortness of breath, or dysuria.   ED Course: Upon arrival to the ED, patient is found to be afebrile and saturating well on room air with normal heart rate and blood pressure.  Labs are most notable for elevated BUN to creatinine ratio, total bilirubin 2.8, WBC 17,200, amylase 351, lipase 65, CRP 19.3, and ESR 67.   CT is concerning for severe acute pancreatitis with increase in number and size of fluid collections.  No biliary ductal dilatation noted on CT.   GI was consulted by the ED physician and the patient was given a liter of LR, morphine, and Zofran.  Subjective: Hasn't eaten anything but would to try. Pain controlled, feels a bit better since he got the IV fluids. Tried broth this afternoon and vomited quickly.   Objective: BP 120/63 (BP Location: Left Arm)   Pulse 67   Temp 98.7 F (37.1 C)   Resp 18   Ht 5\' 8"  (1.727 m)   Wt 73.2 kg   SpO2 98%   BMI 24.54 kg/m   Gen: Pleasant elderly male in no distress Pulm: Clear, nonlabored  CV: RRR, no MRG or pitting edema GI: Soft, nondistended. Tender in epigastrium primarily without rebound. +BS.  Neuro: Alert and oriented. No new focal deficits. Ext: Warm, no deformities. Skin: No rashes, lesions or ulcers on visualized skin   Assessment & Plan: Acute pancreatitis  - GI recommended regular  diet. Encouraged slow progress to pt but he still had emesis with liquids. Will go back to NPO. Discussed TPN/TNA vs. post-pyloric NG for artificial nutrition, will not pull the trigger yet. Restart creon once taking po.  - Continue IVF, IV antiemetics, IV analgesics.  - Appreciate GI following - Pt is afebrile with mild leukocytosis, no definite indication for abx at this time. Will monitor.   Hypokalemia: Supplemented, will monitor  Unintentional weight loss: Related to recurrent/chronic/relapsing pancreatitis symptoms. While as a result of this condition he does not have an appetite, I don't believe the thromboembolic risk of megace is outweighed by potential to improve oral intake.  - RD to be consulted once advancing diet.  - Continue considering artifical nutritional support.   Constipation: Impaction after last hospitalization.  - Continue home laxative regimen.   CAD: No anginal symptoms  - Continue ASA 81mg    PAF  - Not anticoagulated or on rate-control agents pta     Type II DM  - Diet-controlled at home  - Monitor CBGs and use low-intensity SSI if needed   Tyrone Nine, MD [facility] [email or web address] 01/29/2023, 3:44 PM

## 2023-01-30 DIAGNOSIS — K8591 Acute pancreatitis with uninfected necrosis, unspecified: Secondary | ICD-10-CM | POA: Diagnosis not present

## 2023-01-30 LAB — CBC
HCT: 28.5 % — ABNORMAL LOW (ref 39.0–52.0)
Hemoglobin: 9.5 g/dL — ABNORMAL LOW (ref 13.0–17.0)
MCH: 28.2 pg (ref 26.0–34.0)
MCHC: 33.3 g/dL (ref 30.0–36.0)
MCV: 84.6 fL (ref 80.0–100.0)
Platelets: 179 10*3/uL (ref 150–400)
RBC: 3.37 MIL/uL — ABNORMAL LOW (ref 4.22–5.81)
RDW: 14.8 % (ref 11.5–15.5)
WBC: 10.2 10*3/uL (ref 4.0–10.5)
nRBC: 0 % (ref 0.0–0.2)

## 2023-01-30 LAB — COMPREHENSIVE METABOLIC PANEL WITH GFR
ALT: 29 U/L (ref 0–44)
AST: 20 U/L (ref 15–41)
Albumin: 2.2 g/dL — ABNORMAL LOW (ref 3.5–5.0)
Alkaline Phosphatase: 61 U/L (ref 38–126)
Anion gap: 8 (ref 5–15)
BUN: 14 mg/dL (ref 8–23)
CO2: 21 mmol/L — ABNORMAL LOW (ref 22–32)
Calcium: 7.7 mg/dL — ABNORMAL LOW (ref 8.9–10.3)
Chloride: 104 mmol/L (ref 98–111)
Creatinine, Ser: 0.6 mg/dL — ABNORMAL LOW (ref 0.61–1.24)
GFR, Estimated: >60 mL/min (ref >60)
Glucose, Bld: 143 mg/dL — ABNORMAL HIGH (ref 70–99)
Potassium: 3.3 mmol/L — ABNORMAL LOW (ref 3.5–5.1)
Sodium: 133 mmol/L — ABNORMAL LOW (ref 135–145)
Total Bilirubin: 1.4 mg/dL — ABNORMAL HIGH (ref 0.3–1.2)
Total Protein: 5.5 g/dL — ABNORMAL LOW (ref 6.5–8.1)

## 2023-01-30 LAB — GLUCOSE, CAPILLARY
Glucose-Capillary: 145 mg/dL — ABNORMAL HIGH (ref 70–99)
Glucose-Capillary: 159 mg/dL — ABNORMAL HIGH (ref 70–99)
Glucose-Capillary: 161 mg/dL — ABNORMAL HIGH (ref 70–99)
Glucose-Capillary: 150 mg/dL — ABNORMAL HIGH (ref 70–99)
Glucose-Capillary: 142 mg/dL — ABNORMAL HIGH (ref 70–99)

## 2023-01-30 MED ORDER — KCL-LACTATED RINGERS-D5W 20 MEQ/L IV SOLN
INTRAVENOUS | Status: DC
  Filled 2023-01-30 (×12): qty 1000

## 2023-01-30 NOTE — H&P (View-Only) (Signed)
Subjective: Feeling poorly.  Nauseous.  Objective: Vital signs in last 24 hours: Temp:  [98.2 F (36.8 C)-99.7 F (37.6 C)] 98.2 F (36.8 C) (07/28 0719) Pulse Rate:  [66-73] 73 (07/28 0719) Resp:  [16-18] 17 (07/28 0719) BP: (120-140)/(63-76) 140/76 (07/28 0719) SpO2:  [97 %-98 %] 98 % (07/28 0719) Weight:  [74.7 kg] 74.7 kg (07/28 0418)    Intake/Output from previous day: 07/27 0701 - 07/28 0700 In: 600 [P.O.:350] Out: 800 [Urine:200; Emesis/NG output:600] Intake/Output this shift: No intake/output data recorded.  General appearance: alert and nauseated GI: some upper and mid abdominal tenderness  Lab Results: Recent Labs    01/28/23 1321 01/29/23 0143 01/30/23 0227  WBC 17.2* 12.8* 10.2  HGB 11.8* 10.4* 9.5*  HCT 36.9* 31.7* 28.5*  PLT 235 175 179   BMET Recent Labs    01/28/23 1321 01/29/23 0143 01/30/23 0227  NA 137 135 133*  K 3.7 3.4* 3.3*  CL 104 105 104  CO2 23 21* 21*  GLUCOSE 148* 93 143*  BUN 30* 23 14  CREATININE 0.93 0.73 0.60*  CALCIUM 8.5* 8.0* 7.7*   LFT Recent Labs    01/30/23 0227  PROT 5.5*  ALBUMIN 2.2*  AST 20  ALT 29  ALKPHOS 61  BILITOT 1.4*   PT/INR No results for input(s): "LABPROT", "INR" in the last 72 hours. Hepatitis Panel No results for input(s): "HEPBSAG", "HCVAB", "HEPAIGM", "HEPBIGM" in the last 72 hours. C-Diff No results for input(s): "CDIFFTOX" in the last 72 hours. Fecal Lactopherrin No results for input(s): "FECLLACTOFRN" in the last 72 hours.  Studies/Results: CT ABDOMEN PELVIS W CONTRAST  Result Date: 01/28/2023 CLINICAL DATA:  Severe acute pancreatitis EXAM: CT ABDOMEN AND PELVIS WITH CONTRAST TECHNIQUE: Multidetector CT imaging of the abdomen and pelvis was performed using the standard protocol following bolus administration of intravenous contrast. RADIATION DOSE REDUCTION: This exam was performed according to the departmental dose-optimization program which includes automated exposure control,  adjustment of the mA and/or kV according to patient size and/or use of iterative reconstruction technique. CONTRAST:  75mL OMNIPAQUE IOHEXOL 350 MG/ML SOLN COMPARISON:  Previous studies including the examination of 01/24/2023 FINDINGS: Lower chest: Visualized lower lung fields are clear. Coronary artery calcifications are seen. Hepatobiliary: Surgical clips are seen in gallbladder fossa. There are pockets of air in intrahepatic bile ducts. Bile ducts are not dilated. Pancreas: There is enlargement of the head of the pancreas. There are multiple loculated fluid collections in and adjacent to head and proximal body of pancreas. There is interval increase in number and size of the loculated fluid collections in and around head of the pancreas due to severe acute pancreatitis. Largest of these collections measures 4.9 x 3.5 cm in the anterior aspect of the head. There are multiple other smaller loculated fluid collections. Overall size body and tail of pancreas has not changed. There is homogeneous enhancement in body and tail of pancreas. There is 2.2 cm low-density in the anterior margin of tail of pancreas with no significant change. Spleen: Unremarkable. Adrenals/Urinary Tract: Adrenals are unremarkable. There is no hydronephrosis. There are no renal or ureteral stones. Urinary bladder is not distended. Stomach/Bowel: There is wall thickening in the antrum of the stomach and duodenum. Rest of the small bowel loops are unremarkable. The appendix is not dilated. There is no significant wall thickening in colon. Multiple diverticula are seen in the colon, especially in sigmoid with no evidence of focal acute diverticulitis. Vascular/Lymphatic: Calcifications are seen in aorta and its major branches.  Reproductive: Unremarkable. Other: There is no ascites or pneumoperitoneum. Umbilical hernia containing fat is seen. Right inguinal hernia containing fat is seen. Possible small left inguinal hernia containing fat is seen.  Musculoskeletal: Degenerative changes are noted in lumbar spine, more severe at L2-L3 level. There is encroachment of neural foramina at multiple levels. IMPRESSION: Severe acute pancreatitis in the head of the pancreas. There is interval increase in number and size of multiple loculated fluid collections in and around head of the pancreas suggesting interval worsening of acute pancreatitis with possible multiple pseudocysts or foci of pancreatic necrosis. Largest of these fluid collections measures 4.9 x 3.5 cm. There are no pockets of air within these fluid collections. There is a 2.2 cm low-density in the tail of pancreas with no interval change. There is no definite demonstrable new focal pancreatic necrosis in the body and tail. There is wall thickening in the distal antrum of the stomach and the duodenum, possibly gastritis and duodenitis There is no evidence of intestinal obstruction or pneumoperitoneum. Appendix is not dilated. There is no hydronephrosis. Diverticulosis of colon without signs of focal diverticulitis. Aortic arteriosclerosis. Coronary artery disease. Lumbar spondylosis. Electronically Signed   By: Ernie Avena M.D.   On: 01/28/2023 20:18    Medications: Scheduled:  aspirin  81 mg Oral Daily   docusate sodium  100 mg Oral Daily   enoxaparin (LOVENOX) injection  40 mg Subcutaneous Q24H   famotidine  40 mg Oral QHS   insulin aspart  0-6 Units Subcutaneous Q4H   pantoprazole  40 mg Oral Daily   polyethylene glycol  17 g Oral Daily   Continuous:  dextrose 5% lactated ringers with KCl 20 mEq/L 100 mL/hr at 01/30/23 0755   promethazine (PHENERGAN) injection (IM or IVPB) 12.5 mg (01/30/23 0413)    Assessment/Plan: 1) Pancreatitis. 2) Nausea. 3) Anemia.   The patient was not able to tolerate PO yesterday.  He vomited 4-5 times since yesterday afternoon.  Nursing just reported that there was the possibility of some blood in the vomit just now, but it was hard to tell.  He  drinks grape juice with his meds.  He was noted to have a progressive anemia and it will be worthwhile to perform an EGD tomorrow.  There was no evidence of any bleeding into the pancreatic cysts/fluid collections.  As for his pancreatitis, the topic of a longer term nasojejunal feeding tube was discussed.  Plan: 1) Maintain NPO. 2) EGD tomorrow with Dr. Leonides Schanz. 3) Encourage placement of a nasojejunal feeding tube.  LOS: 2 days   , D 01/30/2023, 8:59 AM

## 2023-01-30 NOTE — Progress Notes (Addendum)
[facility] PROGRESS NOTE  Jon Bray (DOB: 12/07/1940) QQV:[id number] PCP: Danella Penton, MD  Brief Narrative: Jon Bray is an 82 y.o. male with a history of CAD, PAF not anticoagulated, and admission in May 2024 for acute gallstone pancreatitis s/p cholecystectomy on 12/03/2022 who presented 7/26 with abdominal pain, nausea, inability to take adequate po found to have AKI, dehydration. CT is concerning for severe acute pancreatitis with increase in number and size of fluid collections.  No biliary ductal dilatation noted on CT.   GI was consulted, and the patient remains on IV fluids and analgesics. Plan for EGD 7/29. Remains NPO, considering nasojejunal feeding tube placement this admission.  Subjective: Feels weak, vomited several times yesterday, like anything he took down bounced at the bottom of his stomach and came right back up. No definite blood, but unclear due to drinking grape juice.  Objective: BP (!) 140/76 (BP Location: Left Arm)   Pulse 73   Temp 98.2 F (36.8 C)   Resp 17   Ht 5\' 8"  (1.727 m)   Wt 74.7 kg   SpO2 98%   BMI 25.04 kg/m   Gen: Elderly pleasant male in no acute distress Pulm: Clear, nonlabored  CV: RRR, no MRG or pitting edema GI: Soft, epigastric tenderness without rebound or guarding, nondistended, +BS Neuro: Alert and oriented. No new focal deficits. Ext: Warm, no deformities Skin: No rashes, lesions or ulcers on visualized skin   Assessment & Plan: Acute pancreatitis  - NPO, stay on IVF, given his emesis and question of hematemesis, GI recommends EGD 7/29.  - It is possible that he has just never fully recovered from the index episode, we've discussed NJ tube feeds for a protracted period of time. Will likely initiate this during this hospitalization pending EGD findings. - Continue IVF, IV antiemetics, IV analgesics.  - Appreciate GI following - Pt is afebrile with mild leukocytosis which resolved without antimicrobial Tx, no definite  indication for abx at this time. Will monitor.   Hypokalemia: Supplemented, still low - Add to IVF  Normocytic anemia: Hgb declining with IVF along with all cell lines, though it is also lower than it has been in the past. Recent B12 472, folic acid 11.8 (both down from prior values), iron normal with elevated ferritin, normal TIBC.  - Monitor in AM.   AKI: Mild, but Cr improved by >0.30 with IVF consistent with prerenal azotemia. CrCl currently >66ml/min.  - Monitor w/IVF.  Unintentional weight loss: Related to recurrent/chronic/relapsing pancreatitis symptoms. While as a result of this condition he does not have an appetite, I don't believe the thromboembolic risk of megace is outweighed by potential to improve oral intake.  - NJ tube being considered by pt. Keeping NPO for now though. - Give multivitamin   Constipation: Impaction after last hospitalization.  - Continue home laxative regimen.   CAD: No anginal symptoms  - Continue ASA 81mg    PAF  - Not anticoagulated or on rate-control agents pta     Type II DM  - Diet-controlled at home  - Monitor CBGs and use low-intensity SSI if needed   Tyrone Nine, MD [facility] [email or web address] 01/30/2023, 12:32 PM

## 2023-01-30 NOTE — Progress Notes (Signed)
Subjective: Feeling poorly.  Nauseous.  Objective: Vital signs in last 24 hours: Temp:  [98.2 F (36.8 C)-99.7 F (37.6 C)] 98.2 F (36.8 C) (07/28 0719) Pulse Rate:  [66-73] 73 (07/28 0719) Resp:  [16-18] 17 (07/28 0719) BP: (120-140)/(63-76) 140/76 (07/28 0719) SpO2:  [97 %-98 %] 98 % (07/28 0719) Weight:  [74.7 kg] 74.7 kg (07/28 0418)    Intake/Output from previous day: 07/27 0701 - 07/28 0700 In: 600 [P.O.:350] Out: 800 [Urine:200; Emesis/NG output:600] Intake/Output this shift: No intake/output data recorded.  General appearance: alert and nauseated GI: some upper and mid abdominal tenderness  Lab Results: Recent Labs    01/28/23 1321 01/29/23 0143 01/30/23 0227  WBC 17.2* 12.8* 10.2  HGB 11.8* 10.4* 9.5*  HCT 36.9* 31.7* 28.5*  PLT 235 175 179   BMET Recent Labs    01/28/23 1321 01/29/23 0143 01/30/23 0227  NA 137 135 133*  K 3.7 3.4* 3.3*  CL 104 105 104  CO2 23 21* 21*  GLUCOSE 148* 93 143*  BUN 30* 23 14  CREATININE 0.93 0.73 0.60*  CALCIUM 8.5* 8.0* 7.7*   LFT Recent Labs    01/30/23 0227  PROT 5.5*  ALBUMIN 2.2*  AST 20  ALT 29  ALKPHOS 61  BILITOT 1.4*   PT/INR No results for input(s): "LABPROT", "INR" in the last 72 hours. Hepatitis Panel No results for input(s): "HEPBSAG", "HCVAB", "HEPAIGM", "HEPBIGM" in the last 72 hours. C-Diff No results for input(s): "CDIFFTOX" in the last 72 hours. Fecal Lactopherrin No results for input(s): "FECLLACTOFRN" in the last 72 hours.  Studies/Results: CT ABDOMEN PELVIS W CONTRAST  Result Date: 01/28/2023 CLINICAL DATA:  Severe acute pancreatitis EXAM: CT ABDOMEN AND PELVIS WITH CONTRAST TECHNIQUE: Multidetector CT imaging of the abdomen and pelvis was performed using the standard protocol following bolus administration of intravenous contrast. RADIATION DOSE REDUCTION: This exam was performed according to the departmental dose-optimization program which includes automated exposure control,  adjustment of the mA and/or kV according to patient size and/or use of iterative reconstruction technique. CONTRAST:  75mL OMNIPAQUE IOHEXOL 350 MG/ML SOLN COMPARISON:  Previous studies including the examination of 01/24/2023 FINDINGS: Lower chest: Visualized lower lung fields are clear. Coronary artery calcifications are seen. Hepatobiliary: Surgical clips are seen in gallbladder fossa. There are pockets of air in intrahepatic bile ducts. Bile ducts are not dilated. Pancreas: There is enlargement of the head of the pancreas. There are multiple loculated fluid collections in and adjacent to head and proximal body of pancreas. There is interval increase in number and size of the loculated fluid collections in and around head of the pancreas due to severe acute pancreatitis. Largest of these collections measures 4.9 x 3.5 cm in the anterior aspect of the head. There are multiple other smaller loculated fluid collections. Overall size body and tail of pancreas has not changed. There is homogeneous enhancement in body and tail of pancreas. There is 2.2 cm low-density in the anterior margin of tail of pancreas with no significant change. Spleen: Unremarkable. Adrenals/Urinary Tract: Adrenals are unremarkable. There is no hydronephrosis. There are no renal or ureteral stones. Urinary bladder is not distended. Stomach/Bowel: There is wall thickening in the antrum of the stomach and duodenum. Rest of the small bowel loops are unremarkable. The appendix is not dilated. There is no significant wall thickening in colon. Multiple diverticula are seen in the colon, especially in sigmoid with no evidence of focal acute diverticulitis. Vascular/Lymphatic: Calcifications are seen in aorta and its major branches.  Reproductive: Unremarkable. Other: There is no ascites or pneumoperitoneum. Umbilical hernia containing fat is seen. Right inguinal hernia containing fat is seen. Possible small left inguinal hernia containing fat is seen.  Musculoskeletal: Degenerative changes are noted in lumbar spine, more severe at L2-L3 level. There is encroachment of neural foramina at multiple levels. IMPRESSION: Severe acute pancreatitis in the head of the pancreas. There is interval increase in number and size of multiple loculated fluid collections in and around head of the pancreas suggesting interval worsening of acute pancreatitis with possible multiple pseudocysts or foci of pancreatic necrosis. Largest of these fluid collections measures 4.9 x 3.5 cm. There are no pockets of air within these fluid collections. There is a 2.2 cm low-density in the tail of pancreas with no interval change. There is no definite demonstrable new focal pancreatic necrosis in the body and tail. There is wall thickening in the distal antrum of the stomach and the duodenum, possibly gastritis and duodenitis There is no evidence of intestinal obstruction or pneumoperitoneum. Appendix is not dilated. There is no hydronephrosis. Diverticulosis of colon without signs of focal diverticulitis. Aortic arteriosclerosis. Coronary artery disease. Lumbar spondylosis. Electronically Signed   By: Ernie Avena M.D.   On: 01/28/2023 20:18    Medications: Scheduled:  aspirin  81 mg Oral Daily   docusate sodium  100 mg Oral Daily   enoxaparin (LOVENOX) injection  40 mg Subcutaneous Q24H   famotidine  40 mg Oral QHS   insulin aspart  0-6 Units Subcutaneous Q4H   pantoprazole  40 mg Oral Daily   polyethylene glycol  17 g Oral Daily   Continuous:  dextrose 5% lactated ringers with KCl 20 mEq/L 100 mL/hr at 01/30/23 0755   promethazine (PHENERGAN) injection (IM or IVPB) 12.5 mg (01/30/23 0413)    Assessment/Plan: 1) Pancreatitis. 2) Nausea. 3) Anemia.   The patient was not able to tolerate PO yesterday.  He vomited 4-5 times since yesterday afternoon.  Nursing just reported that there was the possibility of some blood in the vomit just now, but it was hard to tell.  He  drinks grape juice with his meds.  He was noted to have a progressive anemia and it will be worthwhile to perform an EGD tomorrow.  There was no evidence of any bleeding into the pancreatic cysts/fluid collections.  As for his pancreatitis, the topic of a longer term nasojejunal feeding tube was discussed.  Plan: 1) Maintain NPO. 2) EGD tomorrow with Dr. Leonides Schanz. 3) Encourage placement of a nasojejunal feeding tube.  LOS: 2 days   Agustina Witzke D 01/30/2023, 8:59 AM

## 2023-01-31 ENCOUNTER — Encounter (HOSPITAL_COMMUNITY): Payer: Self-pay | Admitting: Family Medicine

## 2023-01-31 ENCOUNTER — Encounter (HOSPITAL_COMMUNITY)
Admission: EM | Disposition: A | Discharge status: Home Health | Payer: Self-pay | Source: Home / Self Care | Attending: Student

## 2023-01-31 ENCOUNTER — Inpatient Hospital Stay (HOSPITAL_COMMUNITY): Payer: HMO | Admitting: Registered Nurse

## 2023-01-31 DIAGNOSIS — K222 Esophageal obstruction: Secondary | ICD-10-CM | POA: Diagnosis not present

## 2023-01-31 DIAGNOSIS — K2970 Gastritis, unspecified, without bleeding: Secondary | ICD-10-CM | POA: Diagnosis not present

## 2023-01-31 DIAGNOSIS — K8591 Acute pancreatitis with uninfected necrosis, unspecified: Secondary | ICD-10-CM | POA: Diagnosis not present

## 2023-01-31 DIAGNOSIS — K219 Gastro-esophageal reflux disease without esophagitis: Secondary | ICD-10-CM

## 2023-01-31 DIAGNOSIS — K259 Gastric ulcer, unspecified as acute or chronic, without hemorrhage or perforation: Secondary | ICD-10-CM | POA: Diagnosis not present

## 2023-01-31 DIAGNOSIS — K3189 Other diseases of stomach and duodenum: Secondary | ICD-10-CM

## 2023-01-31 DIAGNOSIS — R112 Nausea with vomiting, unspecified: Secondary | ICD-10-CM

## 2023-01-31 DIAGNOSIS — I25119 Atherosclerotic heart disease of native coronary artery with unspecified angina pectoris: Secondary | ICD-10-CM | POA: Diagnosis not present

## 2023-01-31 DIAGNOSIS — K449 Diaphragmatic hernia without obstruction or gangrene: Secondary | ICD-10-CM

## 2023-01-31 DIAGNOSIS — R9389 Abnormal findings on diagnostic imaging of other specified body structures: Secondary | ICD-10-CM

## 2023-01-31 DIAGNOSIS — E1151 Type 2 diabetes mellitus with diabetic peripheral angiopathy without gangrene: Secondary | ICD-10-CM

## 2023-01-31 DIAGNOSIS — I480 Paroxysmal atrial fibrillation: Secondary | ICD-10-CM

## 2023-01-31 HISTORY — PX: ESOPHAGOGASTRODUODENOSCOPY (EGD) WITH PROPOFOL: SHX5813

## 2023-01-31 HISTORY — PX: BIOPSY: SHX5522

## 2023-01-31 LAB — GLUCOSE, CAPILLARY
Glucose-Capillary: 113 mg/dL — ABNORMAL HIGH (ref 70–99)
Glucose-Capillary: 115 mg/dL — ABNORMAL HIGH (ref 70–99)
Glucose-Capillary: 123 mg/dL — ABNORMAL HIGH (ref 70–99)
Glucose-Capillary: 126 mg/dL — ABNORMAL HIGH (ref 70–99)
Glucose-Capillary: 127 mg/dL — ABNORMAL HIGH (ref 70–99)
Glucose-Capillary: 148 mg/dL — ABNORMAL HIGH (ref 70–99)
Glucose-Capillary: 172 mg/dL — ABNORMAL HIGH (ref 70–99)
Glucose-Capillary: 174 mg/dL — ABNORMAL HIGH (ref 70–99)

## 2023-01-31 SURGERY — ESOPHAGOGASTRODUODENOSCOPY (EGD) WITH PROPOFOL
Anesthesia: Monitor Anesthesia Care

## 2023-01-31 MED ORDER — MEGESTROL ACETATE 20 MG PO TABS
20.0000 mg | ORAL_TABLET | Freq: Every day | ORAL | Status: DC
  Administered 2023-02-01 – 2023-02-04 (×4): 20 mg via ORAL
  Filled 2023-01-31 (×5): qty 1

## 2023-01-31 MED ORDER — ONDANSETRON HCL 4 MG/2ML IJ SOLN: 4.0000 mg | Freq: Four times a day (QID) | INTRAMUSCULAR | Status: DC | PRN

## 2023-01-31 MED ORDER — OXYCODONE HCL 5 MG PO TABS: 5.0000 mg | ORAL_TABLET | Freq: Once | ORAL | Status: DC | PRN

## 2023-01-31 MED ORDER — SODIUM CHLORIDE 0.9 % IV SOLN: INTRAVENOUS | Status: DC

## 2023-01-31 MED ORDER — OXYCODONE HCL 5 MG/5ML PO SOLN: 5.0000 mg | Freq: Once | ORAL | Status: DC | PRN

## 2023-01-31 MED ORDER — FENTANYL CITRATE (PF) 100 MCG/2ML IJ SOLN: 25.0000 ug | INTRAMUSCULAR | Status: DC | PRN

## 2023-01-31 MED ORDER — LIDOCAINE 2% (20 MG/ML) 5 ML SYRINGE
INTRAMUSCULAR | Status: DC | PRN
  Administered 2023-01-31: 100 mg via INTRAVENOUS

## 2023-01-31 MED ORDER — PANTOPRAZOLE SODIUM 40 MG IV SOLR
40.0000 mg | Freq: Two times a day (BID) | INTRAVENOUS | Status: DC
  Administered 2023-01-31 – 2023-02-08 (×17): 40 mg via INTRAVENOUS
  Filled 2023-01-31 (×18): qty 10

## 2023-01-31 MED ORDER — LACTATED RINGERS IV SOLN
INTRAVENOUS | Status: AC | PRN
Start: 2023-01-31 — End: 2023-01-31
  Administered 2023-01-31: 10 mL/h via INTRAVENOUS

## 2023-01-31 MED ORDER — PROPOFOL 10 MG/ML IV BOLUS
INTRAVENOUS | Status: DC | PRN
Start: 2023-01-31 — End: 2023-01-31
  Administered 2023-01-31: 80 ug/kg/min via INTRAVENOUS
  Administered 2023-01-31: 30 mg via INTRAVENOUS

## 2023-01-31 MED ORDER — POTASSIUM CHLORIDE 20 MEQ PO PACK
40.0000 meq | PACK | Freq: Once | ORAL | Status: AC
  Administered 2023-01-31: 40 meq via ORAL
  Filled 2023-01-31 (×2): qty 2

## 2023-01-31 SURGICAL SUPPLY — 15 items

## 2023-01-31 NOTE — Op Note (Addendum)
[facility] Patient Name: Jon Bray Procedure Date : 01/31/2023 MRN: [id number] Attending MD: Particia Lather , , [id number] Date of Birth: [birth year] CSN: [id number] Age: 82 Admit Type: Inpatient Procedure:                Upper GI endoscopy Indications:              Abnormal CT of the GI tract, Nausea with vomiting Providers:                Madelyn Brunner" Tawanna Sat Mbumina, Technician,                            Martha Clan, RN Referring MD:             Hospitalist team Medicines:                Monitored Anesthesia Care Complications:            No immediate complications. Estimated Blood Loss:     Estimated blood loss was minimal. Procedure:                Pre-Anesthesia Assessment:                           - Prior to the procedure, a History and Physical                            was performed, and patient medications and                            allergies were reviewed. The patient's tolerance of                            previous anesthesia was also reviewed. The risks                            and benefits of the procedure and the sedation                            options and risks were discussed with the patient.                            All questions were answered, and informed consent                            was obtained. Prior Anticoagulants: The patient has                            taken Lovenox (enoxaparin), last dose was 1 day                            prior to procedure. ASA Grade Assessment: III - A                            patient with severe systemic disease. After  reviewing the risks and benefits, the patient was                            deemed in satisfactory condition to undergo the                            procedure.                           After obtaining informed consent, the endoscope was                            passed under direct vision. Throughout the                             procedure, the patient's blood pressure, pulse, and                            oxygen saturations were monitored continuously. The                            [id number]-H190 ([id number]) Olympus endoscope was introduced                            through the mouth, and advanced to the second part                            of duodenum. The upper GI endoscopy was                            accomplished without difficulty. The patient                            tolerated the procedure well. Scope In: Scope Out: Findings:      One benign-appearing, intrinsic moderate (circumferential scarring or       stenosis; an endoscope may pass) stenosis was found at the       gastroesophageal junction. This stenosis measured less than one cm (in       length). The stenosis was traversed. Biopsies were taken with a cold       forceps for histology.      A hiatal hernia was present.      Localized severe mucosal changes characterized by congestion, erythema,       granularity, nodularity and ulceration were found in the gastric fundus       and in the gastric body. Biopsies were taken with a cold forceps for       histology.      Retained fluid was found in the gastric body.      The examined duodenum was normal. Impression:               - Benign-appearing esophageal stenosis. Biopsied.                           - Hiatal hernia.                           -  Congested, erythematous, granular, nodular and                            ulcerated mucosa in the gastric fundus and gastric                            body. Biopsied.                           - Retained gastric fluid.                           - Normal examined duodenum. Recommendation:           - Return patient to hospital ward for ongoing care.                           - Await pathology results.                           - PPI BID for at least 10 weeks.                           - The findings and recommendations were discussed                             with the patient. Procedure Code(s):        --- Professional ---                           [phone], Esophagogastroduodenoscopy, flexible,                            transoral; with biopsy, single or multiple Diagnosis Code(s):        --- Professional ---                           K22.2, Esophageal obstruction                           K44.9, Diaphragmatic hernia without obstruction or                            gangrene                           K31.89, Other diseases of stomach and duodenum                           K25.9, Gastric ulcer, unspecified as acute or                            chronic, without hemorrhage or perforation                           R11.2, Nausea with vomiting, unspecified  R93.3, Abnormal findings on diagnostic imaging of                            other parts of digestive tract CPT copyright 2022 [employer]. All rights reserved. The codes documented in this report are preliminary and upon coder review may  be revised to meet current compliance requirements. Dr Particia Lather "Alan Ripper" [place],  01/31/2023 12:18:32 PM Number of Addenda: 0

## 2023-01-31 NOTE — Anesthesia Preprocedure Evaluation (Signed)
Anesthesia Evaluation  Patient identified by MRN, date of birth, ID band Patient awake    Reviewed: Allergy & Precautions, H&P , NPO status , Patient's Chart, lab work & pertinent test results  Airway Mallampati: II   Neck ROM: full    Dental   Pulmonary neg pulmonary ROS   breath sounds clear to auscultation       Cardiovascular + angina  + CAD, + Past MI, + Cardiac Stents and + Peripheral Vascular Disease  + dysrhythmias Atrial Fibrillation  Rhythm:irregular Rate:Normal     Neuro/Psych    GI/Hepatic ,GERD  ,,  Endo/Other  diabetes, Type 2    Renal/GU      Musculoskeletal   Abdominal   Peds  Hematology   Anesthesia Other Findings   Reproductive/Obstetrics                             Anesthesia Physical Anesthesia Plan  ASA: 3  Anesthesia Plan: MAC   Post-op Pain Management:    Induction: Intravenous  PONV Risk Score and Plan: 1 and Propofol infusion and Treatment may vary due to age or medical condition  Airway Management Planned: Nasal Cannula  Additional Equipment:   Intra-op Plan:   Post-operative Plan:   Informed Consent: I have reviewed the patients History and Physical, chart, labs and discussed the procedure including the risks, benefits and alternatives for the proposed anesthesia with the patient or authorized representative who has indicated his/her understanding and acceptance.     Dental advisory given  Plan Discussed with: CRNA, Anesthesiologist and Surgeon  Anesthesia Plan Comments:        Anesthesia Quick Evaluation

## 2023-01-31 NOTE — Progress Notes (Signed)
Pt K is 3.3, getting Dextrose in LR with 20K at 100ML/hr.   Dr. Bevely Palmer aware

## 2023-01-31 NOTE — Progress Notes (Signed)
Transition of Care [facility]) - Inpatient Brief Assessment   Patient Details  Name: Jon Bray MRN: [id number] Date of Birth: [birth year]  [facility] CM/SW Contact:    Janae Bridgeman, RN Phone Number: 01/31/2023, 2:46 PM   Clinical Narrative: CM noted that patient was admitted from home with acute pancreatitis and is pending EGD today.  The patient lives with his wife.  No TOC needs have been determined at this time and patient will likely return home with the wife once medically stable to discharge home.   Transition of Care Asessment: Insurance and Status: (P) Insurance coverage has been reviewed Patient has primary care physician: (P) Yes Home environment has been reviewed: (P) Yes - home with wife Prior level of function:: (P) Independent Prior/Current Home Services: (P) No current home services Social Determinants of Health Reivew: (P) SDOH reviewed no interventions necessary Readmission risk has been reviewed: (P) Yes Transition of care needs: (P) no transition of care needs at this time

## 2023-01-31 NOTE — Interval H&P Note (Signed)
History and Physical Interval Note:  01/31/2023 11:34 AM  Jon Bray  has presented today for surgery, with the diagnosis of Anemia and ? upper GI bleed.  The various methods of treatment have been discussed with the patient and family. After consideration of risks, benefits and other options for treatment, the patient has consented to  Procedure(s): ESOPHAGOGASTRODUODENOSCOPY (EGD) WITH PROPOFOL (N/A) as a surgical intervention.  The patient's history has been reviewed, patient examined, no change in status, stable for surgery.  I have reviewed the patient's chart and labs.  Questions were answered to the patient's satisfaction.     Jon Bray

## 2023-01-31 NOTE — Hospital Course (Addendum)
82 y.o.m w/ CAD,PAF-not anticoagulated, and admission in May 2024 for acute gallstone pancreatitis s/p cholecystectomy on 12/03/2022 presented 01/28/23 with abdominal pain, nausea, inability to take adequate PO found to have AKI, dehydration. CT is concerning for severe acute pancreatitis with increase in number and size of fluid collections.  No biliary ductal dilatation noted on CT. GI was consulted, and the patient remains on IV fluids and analgesics. Underwent EGD 7/29 showing benign esophageal stenosis hiatal hernia gastric fundus with ulcerated mucosa congested.

## 2023-01-31 NOTE — Progress Notes (Signed)
PROGRESS NOTE Jon Bray  WNU:[id number] DOB: 1941/01/25 DOA: 01/28/2023 PCP: Danella Penton, MD  Brief Narrative/Hospital Course: 82 y.o.m w/ CAD,PAF-not anticoagulated, and admission in May 2024 for acute gallstone pancreatitis s/p cholecystectomy on 12/03/2022 presented 01/28/23 with abdominal pain, nausea, inability to take adequate PO found to have AKI, dehydration. CT is concerning for severe acute pancreatitis with increase in number and size of fluid collections.  No biliary ductal dilatation noted on CT. GI was consulted, and the patient remains on IV fluids and analgesics.     Subjective: Patient seen and examined wife at bedside C/o nausea, som abd pain but not as bad. Having BM. Ahs not eaten anything since friday Overnight patient afebrile BP running in 140s to 150s, on room air Labs showed a stable hemoglobin slightly up at 12.1 electrolytes fairly stable and LFTs normal calcium 8.4   Assessment and Plan: Principal Problem:   Acute pancreatitis Active Problems:   CAD (coronary artery disease)   PAF (paroxysmal atrial fibrillation) (HCC)   Nausea and vomiting   Abnormal CT scan   Acute pancreatitis: Appears persistent, has not recovered from his pancreatitis, clinically stable although scan abnormal significantly.  Continue on conservative management as per GI, IVF, n.p.o. pain control, antiemetics. calcium electrolytes and LFTs are stable. EGD obtained today shows benign esophageal stenosis biopsied,hiatal hernia,congested erythematous granular nodular and ulcerated mucosa in the gastric fundus retained gastric fluid.Following EGD will likely plan for  core track to allow for pancreas to recover. Continue p.o. twice daily x 10 weeks follow-up pathology results.   Hypokalemia: Resolved    Normocytic anemia: Hemoglobin downtrending in the setting of IV hydration  along with all cell lines, though it is also lower than it has been in the past. Recent B12 472, folic acid 11.8  (both down from prior values),iron normal with elevated ferritin,normal TIBC.  for EGD   Recent Labs  Lab 01/26/23 0941 01/28/23 1321 01/29/23 0143 01/30/23 0227 01/31/23 0354  HGB 12.3* 11.8* 10.4* 9.5* 12.1*  HCT 38.1* 36.9* 31.7* 28.5* 36.3*    IHK:VQQVZDGL.   Recent Labs  Lab 01/26/23 0941 01/28/23 1321 01/29/23 0143 01/30/23 0227 01/31/23 0354  K 4.1 3.7 3.4* 3.3* 3.7  CALCIUM 9.0 8.5* 8.0* 7.7* 8.4*  MG  --   --  1.9  --   --     Unintentional weight loss: Related to recurrent/chronic/relapsing pancreatitis symptoms. While as a result of this condition he does not have an appetite, I don't believe the thromboembolic risk of megace is outweighed by potential to improve oral intake.  - [place] tube being considered by pt. Keeping NPO for now though. He has had Cortrak tube and feeding done before as inpatient.   Constipation: Impaction after last hospitalization.Continue home laxative regimen.    CAD:No anginal symptoms  Continue ASA 81mg    PAF:Not anticoagulated or on rate-control agents pta     Type II DM  Diet control blood sugar stable continue SSI and monitor  Recent Labs  Lab 01/31/23 0520 01/31/23 0718 01/31/23 1100 01/31/23 1210 01/31/23 1243  GLUCAP 174* 172* 115* 113* 127*     DVT prophylaxis: enoxaparin (LOVENOX) injection 40 mg Start: 01/28/23 2115 Code Status:   Code Status: Full Code Family Communication: plan of care discussed with patient at bedside. Patient status is: Inpatient because of acute pancreatitis  Level of care: Med-Surg Dispo: The patient is from: inpatient            Anticipated disposition: TBD  Objective:  Vitals last 24 hrs: Vitals:   01/31/23 1047 01/31/23 1205 01/31/23 1215 01/31/23 1227  BP: (!) 142/84 123/68 127/70 (!) 144/70  Pulse: 74 77 68 73  Resp: 18 20 20  (!) 21  Temp: (!) 96.5 F (35.8 C) 97.6 F (36.4 C)  97.7 F (36.5 C)  TempSrc: Temporal     SpO2: 97% 98% 97% 97%  Weight:      Height:       Weight  change:   Physical Examination: General exam: alert awake, older than stated age HEENT:Oral mucosa moist, Ear/Nose WNL grossly Respiratory system: bilaterally clear BS,no use of accessory muscle Cardiovascular system: S1 & S2 +, No JVD. Gastrointestinal system: Abdomen soft, appears full mildly Tender,BS+ Nervous System:Alert, awake, moving extremities. Extremities: LE edema neg,distal peripheral pulses palpable.  Skin: No rashes,no icterus. MSK: Normal muscle bulk,tone, power  Medications reviewed:  Scheduled Meds:  aspirin  81 mg Oral Daily   docusate sodium  100 mg Oral Daily   enoxaparin (LOVENOX) injection  40 mg Subcutaneous Q24H   famotidine  40 mg Oral QHS   insulin aspart  0-6 Units Subcutaneous Q4H   pantoprazole (PROTONIX) IV  40 mg Intravenous Q12H   polyethylene glycol  17 g Oral Daily   Continuous Infusions:  dextrose 5% lactated ringers with KCl 20 mEq/L 100 mL/hr at 01/31/23 0811   promethazine (PHENERGAN) injection (IM or IVPB) Stopped (01/31/23 0758)      Diet Order             Diet Heart Room service appropriate? Yes; Fluid consistency: Thin  Diet effective now                  Intake/Output Summary (Last 24 hours) at 01/31/2023 1437 Last data filed at 01/31/2023 1200 Gross per 24 hour  Intake 1269.56 ml  Output 500 ml  Net 769.56 ml   Net IO Since Admission: 602.02 mL [01/31/23 1437]  Wt Readings from Last 3 Encounters:  01/30/23 74.7 kg  12/10/22 73.2 kg  11/16/22 76.2 kg     Unresulted Labs (From admission, onward)     Start     Ordered   02/04/23 0500  Creatinine, serum  (enoxaparin (LOVENOX)    CrCl >/= 30 ml/min)  Weekly,   R     Comments: while on enoxaparin therapy    01/28/23 2103           Data Reviewed: I have personally reviewed following labs and imaging studies CBC: Recent Labs  Lab 01/26/23 0941 01/28/23 1321 01/29/23 0143 01/30/23 0227 01/31/23 0354  WBC 9.5 17.2* 12.8* 10.2 12.9*  NEUTROABS 6.3 13.2*  --    --   --   HGB 12.3* 11.8* 10.4* 9.5* 12.1*  HCT 38.1* 36.9* 31.7* 28.5* 36.3*  MCV 86.5 89.3 88.8 84.6 86.6  PLT 266.0 235 175 179 245   Basic Metabolic Panel: Recent Labs  Lab 01/26/23 0941 01/28/23 1321 01/29/23 0143 01/30/23 0227 01/31/23 0354  NA 140 137 135 133* 135  K 4.1 3.7 3.4* 3.3* 3.7  CL 106 104 105 104 104  CO2 25 23 21* 21* 23  GLUCOSE 111* 148* 93 143* 161*  BUN 23 30* 23 14 6*  CREATININE 0.89 0.93 0.73 0.60* 0.80  CALCIUM 9.0 8.5* 8.0* 7.7* 8.4*  MG  --   --  1.9  --   --    GFR: Estimated Creatinine Clearance: 67.7 mL/min (by C-G formula based on SCr of 0.8 mg/dL). Liver Function  Tests: Recent Labs  Lab 01/26/23 0941 01/28/23 1321 01/29/23 0143 01/30/23 0227 01/31/23 0354  AST 36 34 23 20 25   ALT 49 49* 35 29 29  ALKPHOS 68 71 61 61 76  BILITOT 0.8 2.8* 1.9* 1.4* 1.3*  PROT 7.1 7.0 5.9* 5.5* 6.6  ALBUMIN 3.7 3.0* 2.5* 2.2* 2.5*   Recent Labs  Lab 01/26/23 0941 01/28/23 1321 01/28/23 1851  LIPASE 1,258.0* 65*  --   AMYLASE 902*  --  351*   Recent Labs  Lab 01/31/23 0520 01/31/23 0718 01/31/23 1100 01/31/23 1210 01/31/23 1243  GLUCAP 174* 172* 115* 113* 127*   No results found for this or any previous visit (from the past 240 hour(s)).  Antimicrobials: Anti-infectives (From admission, onward)    None      Culture/Microbiology No results found for: "SDES", "SPECREQUEST", "CULT", "REPTSTATUS"  Other culture-see note  Radiology Studies: No results found.   LOS: 3 days   Lanae Boast, MD [facility]  01/31/2023, 2:37 PM

## 2023-01-31 NOTE — Care Management Important Message (Signed)
Important Message  Patient Details  Name: SHELLY RAGA MRN: [id number] Date of Birth: 1941-03-24   Medicare Important Message Given:  Yes     Damiana Berrian Stefan Church 01/31/2023, 3:57 PM

## 2023-01-31 NOTE — Transfer of Care (Signed)
Immediate Anesthesia Transfer of Care Note  Patient: Jon Bray  Procedure(s) Performed: ESOPHAGOGASTRODUODENOSCOPY (EGD) WITH PROPOFOL BIOPSY  Patient Location: PACU  Anesthesia Type:MAC  Level of Consciousness: drowsy  Airway & Oxygen Therapy: Patient Spontanous Breathing  Post-op Assessment: Report given to RN  Post vital signs: Reviewed  Last Vitals:  Vitals Value Taken Time  BP 123/68 01/31/23 1206  Temp    Pulse 71 01/31/23 1208  Resp 20 01/31/23 1208  SpO2 97 % 01/31/23 1208  Vitals shown include unfiled device data.  Last Pain:  Vitals:   01/31/23 1047  TempSrc: Temporal  PainSc: 0-No pain      Patients Stated Pain Goal: 1 (01/29/23 0355)  Complications: There were no known notable events for this encounter.

## 2023-02-01 ENCOUNTER — Inpatient Hospital Stay (HOSPITAL_COMMUNITY): Payer: HMO

## 2023-02-01 DIAGNOSIS — E440 Moderate protein-calorie malnutrition: Secondary | ICD-10-CM | POA: Insufficient documentation

## 2023-02-01 DIAGNOSIS — K859 Acute pancreatitis without necrosis or infection, unspecified: Secondary | ICD-10-CM

## 2023-02-01 DIAGNOSIS — K8591 Acute pancreatitis with uninfected necrosis, unspecified: Secondary | ICD-10-CM | POA: Diagnosis not present

## 2023-02-01 LAB — D-DIMER, QUANTITATIVE: D-Dimer, Quant: 7.02 ug/mL-FEU — ABNORMAL HIGH (ref 0.00–0.50)

## 2023-02-01 LAB — GLUCOSE, CAPILLARY
Glucose-Capillary: 134 mg/dL — ABNORMAL HIGH (ref 70–99)
Glucose-Capillary: 138 mg/dL — ABNORMAL HIGH (ref 70–99)
Glucose-Capillary: 149 mg/dL — ABNORMAL HIGH (ref 70–99)
Glucose-Capillary: 154 mg/dL — ABNORMAL HIGH (ref 70–99)
Glucose-Capillary: 156 mg/dL — ABNORMAL HIGH (ref 70–99)
Glucose-Capillary: 171 mg/dL — ABNORMAL HIGH (ref 70–99)
Glucose-Capillary: 99 mg/dL (ref 70–99)

## 2023-02-01 MED ORDER — ADULT MULTIVITAMIN W/MINERALS CH
1.0000 | ORAL_TABLET | Freq: Every day | ORAL | Status: DC
  Administered 2023-02-01 – 2023-02-05 (×5): 1 via ORAL
  Filled 2023-02-01 (×6): qty 1

## 2023-02-01 MED ORDER — TRAMADOL HCL 50 MG PO TABS: 50.0000 mg | ORAL_TABLET | Freq: Four times a day (QID) | ORAL | Status: DC | PRN

## 2023-02-01 MED ORDER — IOHEXOL 350 MG/ML SOLN
75.0000 mL | Freq: Once | INTRAVENOUS | Status: AC | PRN
  Administered 2023-02-01: 75 mL via INTRAVENOUS

## 2023-02-01 NOTE — Plan of Care (Signed)

## 2023-02-01 NOTE — Progress Notes (Addendum)
PROGRESS NOTE Jon Bray  ZOX:[id number] DOB: [birth year] DOA: 01/28/2023 PCP: Danella Penton, MD  Brief Narrative/Hospital Course: 82 y.o.m w/ CAD,PAF-not anticoagulated, and admission in May 2024 for acute gallstone pancreatitis s/p cholecystectomy on 12/03/2022 presented 01/28/23 with abdominal pain, nausea, inability to take adequate PO found to have AKI, dehydration. CT is concerning for severe acute pancreatitis with increase in number and size of fluid collections.  No biliary ductal dilatation noted on CT. GI was consulted, and the patient remains on IV fluids and analgesics. Underwent EGD 7/29 showing benign esophageal stenosis hiatal hernia gastric fundus with ulcerated mucosa congested.     Subjective: Patient seen and examined this morning HE DID EAT morning and did well no nausea or vomiting today Some NEW abdominal discomfort on rt mid abdomen earlier but not now Overnight afebrile, not hypoxic  Blood sugar remains stable  Awaiting for cortrak tube.   Assessment and Plan: Principal Problem:   Acute pancreatitis Active Problems:   CAD (coronary artery disease)   PAF (paroxysmal atrial fibrillation) (HCC)   Nausea and vomiting   Abnormal CT scan   Malnutrition of moderate degree   Acute pancreatitis: Appears persistent, has not recovered from his pancreatitis, clinically stable although scan abnormal significantly.  Continue on conservative management as per GI, IVF, n.p.o. pain control, antiemetics. calcium electrolytes and LFTs are stable. EGD 7/29>benign esophageal stenosis biopsied,hiatal hernia,congested erythematous granular nodular and ulcerated mucosa in the gastric fundus retained gastric fluid.continue Protonix 40 mg twice daily x 10 weeks and follow-up pathology results. Planning for core track tube placement , in the meantime adding regular diet and Augmentin, RD following   Hypokalemia: Resolved    Normocytic anemia: Hemoglobin overall stable.  Some drop due to  dilution.Recent B12 472, folic acid 11.8 (both down from prior values),iron normal with elevated ferritin,normal TIBC.  Recent Labs  Lab 01/26/23 0941 01/28/23 1321 01/29/23 0143 01/30/23 0227 01/31/23 0354  HGB 12.3* 11.8* 10.4* 9.5* 12.1*  HCT 38.1* 36.9* 31.7* 28.5* 36.3*    WJX:BJYNWGNF.   Recent Labs  Lab 01/26/23 0941 01/28/23 1321 01/29/23 0143 01/30/23 0227 01/31/23 0354  K 4.1 3.7 3.4* 3.3* 3.7  CALCIUM 9.0 8.5* 8.0* 7.7* 8.4*  MG  --   --  1.9  --   --     Unintentional weight loss Moderate malnutrition: Related to recurrent/chronic/relapsing pancreatitis symptoms> ID following closely planning for core track tube placement and discharge on tube feeding for home   Constipation: Impaction after last hospitalization.Continue home laxative regimen.    CAD:No anginal symptoms.Continue ASA 81mg .   PAF:Not anticoagulated or on rate-control agents pta     Type II DM: Diet controlled Recent Labs  Lab 02/01/23 0008 02/01/23 0422 02/01/23 0716 02/01/23 1032 02/01/23 1234  GLUCAP 149* 156* 154* 171* 138*   ADDENDUM AT 6 PM Pleuritic chest pain: He continues to have pain worsening as day progresses, in rt rib area and pleuritic. Cxr ok,  D Dimer was elevated> discussed with patient and wife and they want to proceed with CT to r/o PE. Cont on ivf..  DVT prophylaxis: enoxaparin (LOVENOX) injection 40 mg Start: 01/28/23 2115 Code Status:   Code Status: Full Code Family Communication: plan of care discussed with patient at bedside. Patient status is: Inpatient because of acute pancreatitis  Level of care: Med-Surg Dispo: The patient is from: inpatient            Anticipated disposition: TBD  Objective: Vitals last 24 hrs: Vitals:   02/01/23  0932 02/01/23 0551 02/01/23 0715 02/01/23 1020  BP: 132/78  128/71 (!) 123/106  Pulse: 80  84 80  Resp:    18  Temp: 98.9 F (37.2 C)  98.9 F (37.2 C) 98 F (36.7 C)  TempSrc: Oral   Oral  SpO2: 97%  97% 99%  Weight:   74.7 kg    Height:       Weight change:   Physical Examination: General exam: alert awake, orientedx3 HEENT:Oral mucosa moist, Ear/Nose WNL grossly Respiratory system: Bilaterally clear BS,no use of accessory muscle Cardiovascular system: S1 & S2 +, No JVD. Gastrointestinal system: Abdomen soft,NT,ND, BS+ Nervous System: Alert, awake, moving all extremities,and following commands. Extremities: LE edema neg,distal peripheral pulses palpable and warm.  Skin: No rashes,no icterus. MSK: Normal muscle bulk,tone, power   Medications reviewed:  Scheduled Meds:  aspirin  81 mg Oral Daily   docusate sodium  100 mg Oral Daily   enoxaparin (LOVENOX) injection  40 mg Subcutaneous Q24H   famotidine  40 mg Oral QHS   insulin aspart  0-6 Units Subcutaneous Q4H   megestrol  20 mg Oral Daily   multivitamin with minerals  1 tablet Oral Daily   pantoprazole (PROTONIX) IV  40 mg Intravenous Q12H   polyethylene glycol  17 g Oral Daily   Continuous Infusions:  dextrose 5% lactated ringers with KCl 20 mEq/L 100 mL/hr at 02/01/23 0425   promethazine (PHENERGAN) injection (IM or IVPB) Stopped (01/30/23 2049)      Diet Order             Diet regular Room service appropriate? Yes; Fluid consistency: Thin  Diet effective now                  Intake/Output Summary (Last 24 hours) at 02/01/2023 1335 Last data filed at 02/01/2023 0830 Gross per 24 hour  Intake 2451.72 ml  Output --  Net 2451.72 ml   Net IO Since Admission: 3,053.74 mL [02/01/23 1335]  Wt Readings from Last 3 Encounters:  02/01/23 74.7 kg  12/10/22 73.2 kg  11/16/22 76.2 kg     Unresulted Labs (From admission, onward)     Start     Ordered   02/04/23 0500  Creatinine, serum  (enoxaparin (LOVENOX)    CrCl >/= 30 ml/min)  Weekly,   R     Comments: while on enoxaparin therapy    01/28/23 2103           Data Reviewed: I have personally reviewed following labs and imaging studies CBC: Recent Labs  Lab  01/26/23 0941 01/28/23 1321 01/29/23 0143 01/30/23 0227 01/31/23 0354  WBC 9.5 17.2* 12.8* 10.2 12.9*  NEUTROABS 6.3 13.2*  --   --   --   HGB 12.3* 11.8* 10.4* 9.5* 12.1*  HCT 38.1* 36.9* 31.7* 28.5* 36.3*  MCV 86.5 89.3 88.8 84.6 86.6  PLT 266.0 235 175 179 245   Basic Metabolic Panel: Recent Labs  Lab 01/26/23 0941 01/28/23 1321 01/29/23 0143 01/30/23 0227 01/31/23 0354  NA 140 137 135 133* 135  K 4.1 3.7 3.4* 3.3* 3.7  CL 106 104 105 104 104  CO2 25 23 21* 21* 23  GLUCOSE 111* 148* 93 143* 161*  BUN 23 30* 23 14 6*  CREATININE 0.89 0.93 0.73 0.60* 0.80  CALCIUM 9.0 8.5* 8.0* 7.7* 8.4*  MG  --   --  1.9  --   --    GFR: Estimated Creatinine Clearance: 67.7 mL/min (by C-G formula  based on SCr of 0.8 mg/dL). Liver Function Tests: Recent Labs  Lab 01/26/23 0941 01/28/23 1321 01/29/23 0143 01/30/23 0227 01/31/23 0354  AST 36 34 23 20 25   ALT 49 49* 35 29 29  ALKPHOS 68 71 61 61 76  BILITOT 0.8 2.8* 1.9* 1.4* 1.3*  PROT 7.1 7.0 5.9* 5.5* 6.6  ALBUMIN 3.7 3.0* 2.5* 2.2* 2.5*   Recent Labs  Lab 01/26/23 0941 01/28/23 1321 01/28/23 1851  LIPASE 1,258.0* 65*  --   AMYLASE 902*  --  351*   Recent Labs  Lab 02/01/23 0008 02/01/23 0422 02/01/23 0716 02/01/23 1032 02/01/23 1234  GLUCAP 149* 156* 154* 171* 138*   No results found for this or any previous visit (from the past 240 hour(s)).  Antimicrobials: Anti-infectives (From admission, onward)    None      Culture/Microbiology No results found for: "SDES", "SPECREQUEST", "CULT", "REPTSTATUS"  Other culture-see note  Radiology Studies: No results found.   LOS: 4 days   Lanae Boast, MD [facility]  02/01/2023, 1:35 PM

## 2023-02-01 NOTE — Progress Notes (Signed)
PT transferred to 5C20 with all belongings at bedside.

## 2023-02-01 NOTE — Progress Notes (Signed)
Initial Nutrition Assessment  DOCUMENTATION CODES:   Non-severe (moderate) malnutrition in context of chronic illness  INTERVENTION:  Liberalize diet to regular due to increased needs Multivitamin w/ minerals daily Recommend restarting creon  Once Cortrak placement confirmed, start enteral nutrition: Start Osmolite 1.5 at 20 mL/hr and advance by 10 mL q12h to goal rate of 60 mL/hr (1440 mL per day) 60 mL ProSource TF20 - daily Free water flush: 150 mL q4h  Regimen provides 2240 kcal, 110 gm protein, and 1997 mL total; free water daily.   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  GOAL:   Patient will meet greater than or equal to 90% of their needs  MONITOR:   PO intake, Labs, Weight trends, TF tolerance, I & O's  REASON FOR ASSESSMENT:   Consult Assessment of nutrition requirement/status, Enteral/tube feeding initiation and management  ASSESSMENT:   82 y.o. male presented to the ED with abdominal pain and nausea. PT recently admitted in May for acute gallstone pancreatitis s/p cholecystectomy. PMH includes CAD, diverticulosis, HLD, GERD, T2DM, and PAF. Pt admitted with acute pancreatitis.   7/26 - Admitted 7/29 - EGD  Met with pt and wife in room. Shares that he was doing fairly well at home up until last week. Report that he was having to eat and then could drink something two hours later, was tolerating foods well. Was drinking protein shakes as well. Denied any vomiting or diarrhea PTA. Was taking Creon at home - TID.  Shares that his weight has stayed stable at ~150# since discharge. Was able to start moving a bit more and was in the gym one day. UBW is ~185# and had weight loss during last flare of pancreatitis.   Pt and wife aware of plan for post-pyloric Cortrak placement tomorrow and initiating enteral nutrition. Plan for possible discharge with  tube home for supplemental nutrition.   Discussed with MD recommend restarting home creon.   Medications reviewed and include: Colace, Pepcid, NovoLog SSI, Megace, Protonix, Miralax Labs reviewed. CBG: 123-156 x 24 hrs   NUTRITION - FOCUSED PHYSICAL EXAM:  Flowsheet Row Most Recent Value  Orbital Region Moderate depletion  Upper Arm Region Moderate depletion  Thoracic and Lumbar Region Moderate depletion  Buccal Region Moderate depletion  Temple Region Moderate depletion  Clavicle Bone Region Severe depletion  Clavicle and Acromion Bone Region Moderate depletion  Scapular Bone Region Moderate depletion  Dorsal Hand Mild depletion  Patellar Region Severe depletion  Anterior Thigh Region Severe depletion  Posterior Calf Region Severe depletion  Edema (RD Assessment) None  Hair Reviewed  Eyes Reviewed  Mouth Reviewed  Skin Reviewed  Nails Reviewed   Diet Order:   Diet Order             Diet regular Room service appropriate? Yes; Fluid consistency: Thin  Diet effective now                   EDUCATION NEEDS:   Education needs have been addressed  Skin:  Skin Assessment: Reviewed RN Assessment  Last BM:  7/29  Height:   Ht Readings from Last 1 Encounters:  01/28/23 5\' 8"  (1.727 m)    Weight:   Wt Readings from Last 1 Encounters:  02/01/23 74.7 kg    Ideal Body Weight:  70 kg  BMI:  Body mass index is 25.04 kg/m.  Estimated Nutritional Needs:  Kcal:  2100-2300 Protein:  105-125 gtrams Fluid:  >/= 2 L   Kirby Crigler RD, LDN Clinical Dietitian See [facility] for contact information.

## 2023-02-01 NOTE — Anesthesia Postprocedure Evaluation (Signed)
Anesthesia Post Note  Patient: Jon Bray  Procedure(s) Performed: ESOPHAGOGASTRODUODENOSCOPY (EGD) WITH PROPOFOL BIOPSY     Patient location during evaluation: Endoscopy Anesthesia Type: MAC Level of consciousness: awake and alert Pain management: pain level controlled Vital Signs Assessment: post-procedure vital signs reviewed and stable Respiratory status: spontaneous breathing, nonlabored ventilation, respiratory function stable and patient connected to nasal cannula oxygen Cardiovascular status: stable and blood pressure returned to baseline Postop Assessment: no apparent nausea or vomiting Anesthetic complications: no   There were no known notable events for this encounter.  Last Vitals:  Vitals:   02/01/23 0418 02/01/23 0715  BP: 132/78 128/71  Pulse: 80 84  Resp:    Temp: 37.2 C 37.2 C  SpO2: 97% 97%    Last Pain:  Vitals:   02/01/23 0418  TempSrc: Oral  PainSc:                  [name] S

## 2023-02-01 NOTE — Progress Notes (Signed)
Gastroenterology Inpatient Follow Up    Subjective: Patient continues to have pain in the right upper quadrant.  He has been able to eat some small bites of food today.  He denies any nausea or vomiting today.  Objective: Vital signs in last 24 hours: Temp:  [98 F (36.7 C)-98.9 F (37.2 C)] 98.3 F (36.8 C) (07/30 1602) Pulse Rate:  [74-87] 74 (07/30 1602) Resp:  [18] 18 (07/30 1602) BP: (123-134)/(65-106) 131/65 (07/30 1602) SpO2:  [95 %-99 %] 99 % (07/30 1020) Weight:  [74.7 kg] 74.7 kg (07/30 0551) Last BM Date : 01/31/23  Intake/Output from previous day: 07/29 0701 - 07/30 0700 In: 2751.7 [I.V.:2649.2; IV Piggyback:102.5] Out: -  Intake/Output this shift: Total I/O In: 100 [P.O.:100] Out: -   General appearance: alert and cooperative Resp: no increased WOB Cardio: regular rate GI: tender in the RUQ, non-distended  Lab Results: Recent Labs    01/30/23 0227 01/31/23 0354  WBC 10.2 12.9*  HGB 9.5* 12.1*  HCT 28.5* 36.3*  PLT 179 245   BMET Recent Labs    01/30/23 0227 01/31/23 0354  NA 133* 135  K 3.3* 3.7  CL 104 104  CO2 21* 23  GLUCOSE 143* 161*  BUN 14 6*  CREATININE 0.60* 0.80  CALCIUM 7.7* 8.4*   LFT Recent Labs    01/31/23 0354  PROT 6.6  ALBUMIN 2.5*  AST 25  ALT 29  ALKPHOS 76  BILITOT 1.3*   PT/INR No results for input(s): "LABPROT", "INR" in the last 72 hours. Hepatitis Panel No results for input(s): "HEPBSAG", "HCVAB", "HEPAIGM", "HEPBIGM" in the last 72 hours. C-Diff No results for input(s): "CDIFFTOX" in the last 72 hours.  Studies/Results: DG Chest Port 1 View  Result Date: 02/01/2023 CLINICAL DATA:  [id number] Rib pain [id number] EXAM: PORTABLE CHEST 1 VIEW COMPARISON:  12/07/2022. FINDINGS: Bilateral lung fields are clear. Bilateral costophrenic angles are clear. Normal cardio-mediastinal silhouette. Aortic arch calcifications noted. No acute osseous abnormalities. The soft tissues are within normal limits. IMPRESSION: 1.  No active disease. Aortic Atherosclerosis (ICD10-I70.0). Electronically Signed   By: Jules Schick M.D.   On: 02/01/2023 17:15    Medications: I have reviewed the patient's current medications. Scheduled:  aspirin  81 mg Oral Daily   docusate sodium  100 mg Oral Daily   enoxaparin (LOVENOX) injection  40 mg Subcutaneous Q24H   famotidine  40 mg Oral QHS   insulin aspart  0-6 Units Subcutaneous Q4H   megestrol  20 mg Oral Daily   multivitamin with minerals  1 tablet Oral Daily   pantoprazole (PROTONIX) IV  40 mg Intravenous Q12H   polyethylene glycol  17 g Oral Daily   Continuous:  dextrose 5% lactated ringers with KCl 20 mEq/L 75 mL/hr at 02/01/23 1440   promethazine (PHENERGAN) injection (IM or IVPB) Stopped (01/30/23 2049)   EAV:[id number] **OR** acetaminophen, HYDROmorphone (DILAUDID) injection, ondansetron **OR** ondansetron (ZOFRAN) IV, promethazine (PHENERGAN) injection (IM or IVPB), traMADol  Assessment/Plan: 82 year old male with history of gallstone pancreatitis s/p lap cholecystectomy presented with ab pain and N&V, found to have acute pancreatitis.  EGD yesterday showed severe gastritis with biopsies that came back for ischemia.  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  Perhaps his gastric ischemia is due to poor perfusion from dehydration.  Patient has had several recent CT A/P that showed patent mesenteric vasculature.  Will continue to treat his ischemic gastritis with PPI therapy.  Patient has been trying to eat today  while he is waiting for core track tube placement for postpyloric tube feeding.  -Continue IV fluids and pain control -Continue PPI twice daily -Awaiting core track tube placement for postpyloric tube feeding   LOS: 4 days   Imogene Burn 02/01/2023, 6:01 PM

## 2023-02-02 ENCOUNTER — Inpatient Hospital Stay (HOSPITAL_COMMUNITY): Payer: HMO

## 2023-02-02 DIAGNOSIS — K859 Acute pancreatitis without necrosis or infection, unspecified: Secondary | ICD-10-CM | POA: Diagnosis not present

## 2023-02-02 DIAGNOSIS — M7989 Other specified soft tissue disorders: Secondary | ICD-10-CM

## 2023-02-02 DIAGNOSIS — K8591 Acute pancreatitis with uninfected necrosis, unspecified: Secondary | ICD-10-CM | POA: Diagnosis not present

## 2023-02-02 LAB — GLUCOSE, CAPILLARY
Glucose-Capillary: 135 mg/dL — ABNORMAL HIGH (ref 70–99)
Glucose-Capillary: 137 mg/dL — ABNORMAL HIGH (ref 70–99)
Glucose-Capillary: 138 mg/dL — ABNORMAL HIGH (ref 70–99)
Glucose-Capillary: 143 mg/dL — ABNORMAL HIGH (ref 70–99)
Glucose-Capillary: 143 mg/dL — ABNORMAL HIGH (ref 70–99)
Glucose-Capillary: 157 mg/dL — ABNORMAL HIGH (ref 70–99)

## 2023-02-02 LAB — PHOSPHORUS: Phosphorus: 2.9 mg/dL (ref 2.5–4.6)

## 2023-02-02 LAB — MAGNESIUM: Magnesium: 1.8 mg/dL (ref 1.7–2.4)

## 2023-02-02 MED ORDER — PROSOURCE TF20 ENFIT COMPATIBL EN LIQD
60.0000 mL | Freq: Every day | ENTERAL | Status: DC
  Administered 2023-02-02 – 2023-02-08 (×7): 60 mL
  Filled 2023-02-02 (×7): qty 60

## 2023-02-02 MED ORDER — OSMOLITE 1.5 CAL PO LIQD
1000.0000 mL | ORAL | Status: DC
  Administered 2023-02-02 – 2023-02-06 (×3): 1000 mL
  Filled 2023-02-02 (×9): qty 1000

## 2023-02-02 MED ORDER — FREE WATER
150.0000 mL | Status: DC
  Administered 2023-02-02 – 2023-02-07 (×28): 150 mL

## 2023-02-02 MED ORDER — METOCLOPRAMIDE HCL 5 MG/ML IJ SOLN
10.0000 mg | Freq: Once | INTRAMUSCULAR | Status: AC
  Administered 2023-02-02: 10 mg via INTRAVENOUS
  Filled 2023-02-02: qty 2

## 2023-02-02 NOTE — Procedures (Signed)
Cortrak  Person Inserting Tube:  Thomas, Aspen T, RD Tube Type:  Cortrak - 55 inches Tube Size:  10 Tube Location:  Left nare Secured by: Bridle Technique Used to Measure Tube Placement:  Marking at nare/corner of mouth Cortrak Secured At:  92 cm   Cortrak Tube Team Note:  Consult received to place a Cortrak feeding tube.   X-ray is required, abdominal x-ray has been ordered by the Cortrak team. Please confirm tube placement before using the Cortrak tube.   If the tube becomes dislodged please keep the tube and contact the Cortrak team at [email or web address] for replacement.  If after hours and replacement cannot be delayed, place a NG tube and confirm placement with an abdominal x-ray.    Cammy Copa., RD, LDN, CNSC See AMiON for contact information

## 2023-02-02 NOTE — Progress Notes (Signed)
Lower extremity venous duplex completed. Please see CV Procedures for preliminary results.  Shona Simpson, RVT 02/02/23 1:02 PM

## 2023-02-02 NOTE — Progress Notes (Signed)
Nutrition Brief Note   Cortrak feeding tube placed today. X-ray confirmed placement in the descending duodenum. GI MD ok with starting feeds in current location.   Tube Feeds via Cortrak tube: Start Osmolite 1.5 at 20 mL/hr and advance by 10 mL q12h to goal rate of 60 mL/hr (1440 mL per day) 60 mL ProSource TF20 - daily Free water flush: 150 mL q4h  Regimen provides 2240 kcal, 110 gm protein, and 1997 mL total; free water daily.   Monitor magnesium, potassium, and phosphorus BID for at least 3 days, MD to replete as needed, as pt is at risk for refeeding syndrome given ongoing poor PO intake > 5 days.    RD team will continue to follow.  Kirby Crigler RD, LDN Clinical Dietitian See Loretha Stapler for contact information.

## 2023-02-02 NOTE — Progress Notes (Signed)
PROGRESS NOTE Jon Bray  GBT:[id number] DOB: 13-Sep-1940 DOA: 01/28/2023 PCP: Danella Penton, MD  Brief Narrative/Hospital Course: 82 y.o.m w/ CAD,PAF-not anticoagulated, and admission in May 2024 for acute gallstone pancreatitis s/p cholecystectomy on 12/03/2022 presented 01/28/23 with abdominal pain, nausea, inability to take adequate PO found to have AKI, dehydration. CT is concerning for severe acute pancreatitis with increase in number and size of fluid collections.  No biliary ductal dilatation noted on CT. GI was consulted, and the patient remains on IV fluids and analgesics. Underwent EGD 7/29 showing benign esophageal stenosis hiatal hernia gastric fundus with ulcerated mucosa congested.     Subjective: Patient seen and examined this morning.  He is resting comfortably no pain today at rest, core track about to be inserted  No acute events overnight But complains of nausea and Reglan ordered  Assessment and Plan: Principal Problem:   Acute pancreatitis Active Problems:   CAD (coronary artery disease)   PAF (paroxysmal atrial fibrillation) (HCC)   Nausea and vomiting   Abnormal CT scan   Malnutrition of moderate degree   Acute pancreatitis: Appears persistent, has not recovered from his pancreatitis, clinically stable although scan abnormal significantly.  Continue on conservative management as per GI, IVF, n.p.o. pain control, antiemetics. calcium electrolytes and LFTs are stable.EGD 7/29>benign esophageal stenosis biopsied,hiatal hernia,congested erythematous granular nodular and ulcerated mucosa in the gastric fundus retained gastric fluid.continue Protonix 40 mg twice daily x 10 weeks and follow-up pathology results.  Awaiting for core track tube placement and tube feed.  Was reluctant to eat last night. RD following   Hypokalemia: Resolved    Normocytic anemia: Hemoglobin overall stable.Recent B12 472, folic acid 11.8 (both down from prior values),iron normal with elevated  ferritin,normal TIBC.  Recent Labs  Lab 01/28/23 1321 01/29/23 0143 01/30/23 0227 01/31/23 0354 02/02/23 0214  HGB 11.8* 10.4* 9.5* 12.1* 11.1*  HCT 36.9* 31.7* 28.5* 36.3* 33.2*    XTG:GYIRSWNI.   Recent Labs  Lab 01/28/23 1321 01/29/23 0143 01/30/23 0227 01/31/23 0354 02/02/23 0214  K 3.7 3.4* 3.3* 3.7 3.6  CALCIUM 8.5* 8.0* 7.7* 8.4* 8.2*  MG  --  1.9  --   --   --     Unintentional weight loss Moderate malnutrition: Related to recurrent/chronic/relapsing pancreatitis symptoms> ID following closely planning for core track tube placement and discharge on tube feeding for home   Constipation: Impaction after last hospitalization.Continue home laxative regimen.    CAD:No anginal symptoms.Continue ASA 81mg .   PAF:Not anticoagulated or on rate-control agents pta     Type II DM: Diet controlled Recent Labs  Lab 02/01/23 1234 02/01/23 1558 02/01/23 2051 02/02/23 0004 02/02/23 0409  GLUCAP 138* 134* 99 143* 135*   Pleuritic chest pain with elevated D-dimer: Likely in the setting of patient's pancreatitis, CT angio negative for PE.check duplex legs.   DVT prophylaxis: enoxaparin (LOVENOX) injection 40 mg Start: 01/28/23 2115 Code Status:   Code Status: Full Code Family Communication: plan of care discussed with patient at bedside. Patient status is: Inpatient because of acute pancreatitis  Level of care: Med-Surg Dispo: The patient is from: inpatient            Anticipated disposition: TBD  Objective: Vitals last 24 hrs: Vitals:   02/02/23 0003 02/02/23 0407 02/02/23 0500 02/02/23 0729  BP: 134/88 (!) 128/56  128/71  Pulse: 84 79  81  Resp:    18  Temp: 97.8 F (36.6 C) 97.7 F (36.5 C)  98.7 F (37.1 C)  TempSrc: Oral Oral  Oral  SpO2: 98% 96%  96%  Weight:   70.1 kg   Height:       Weight change: -4.6 kg  Physical Examination: General exam: alert awake, oriented at baseline, older than stated age HEENT:Oral mucosa moist, Ear/Nose WNL  grossly Respiratory system: Bilaterally clear BS,no use of accessory muscle Cardiovascular system: S1 & S2 +, No JVD. Gastrointestinal system: Abdomen soft, mildly tender abdomen, BS+ Nervous System: Alert, awake, moving all extremities,and following commands. Extremities: LE edema neg,distal peripheral pulses palpable and warm.  Skin: No rashes,no icterus. MSK: Normal muscle bulk,tone, power   Medications reviewed:  Scheduled Meds:  aspirin  81 mg Oral Daily   docusate sodium  100 mg Oral Daily   enoxaparin (LOVENOX) injection  40 mg Subcutaneous Q24H   famotidine  40 mg Oral QHS   insulin aspart  0-6 Units Subcutaneous Q4H   megestrol  20 mg Oral Daily   multivitamin with minerals  1 tablet Oral Daily   pantoprazole (PROTONIX) IV  40 mg Intravenous Q12H   polyethylene glycol  17 g Oral Daily   Continuous Infusions:  dextrose 5% lactated ringers with KCl 20 mEq/L 75 mL/hr at 02/02/23 0302   promethazine (PHENERGAN) injection (IM or IVPB) Stopped (01/30/23 2049)      Diet Order             Diet regular Room service appropriate? Yes; Fluid consistency: Thin  Diet effective now                 No intake or output data in the 24 hours ending 02/02/23 1005  Net IO Since Admission: 3,053.74 mL [02/02/23 1005]  Wt Readings from Last 3 Encounters:  02/02/23 70.1 kg  12/10/22 73.2 kg  11/16/22 76.2 kg     Unresulted Labs (From admission, onward)     Start     Ordered   02/04/23 0500  Creatinine, serum  (enoxaparin (LOVENOX)    CrCl >/= 30 ml/min)  Weekly,   R     Comments: while on enoxaparin therapy    01/28/23 2103           Data Reviewed: I have personally reviewed following labs and imaging studies CBC: Recent Labs  Lab 01/28/23 1321 01/29/23 0143 01/30/23 0227 01/31/23 0354 02/02/23 0214  WBC 17.2* 12.8* 10.2 12.9* 12.7*  NEUTROABS 13.2*  --   --   --   --   HGB 11.8* 10.4* 9.5* 12.1* 11.1*  HCT 36.9* 31.7* 28.5* 36.3* 33.2*  MCV 89.3 88.8 84.6  86.6 83.6  PLT 235 175 179 245 232   Basic Metabolic Panel: Recent Labs  Lab 01/28/23 1321 01/29/23 0143 01/30/23 0227 01/31/23 0354 02/02/23 0214  NA 137 135 133* 135 132*  K 3.7 3.4* 3.3* 3.7 3.6  CL 104 105 104 104 101  CO2 23 21* 21* 23 20*  GLUCOSE 148* 93 143* 161* 157*  BUN 30* 23 14 6* 8  CREATININE 0.93 0.73 0.60* 0.80 0.68  CALCIUM 8.5* 8.0* 7.7* 8.4* 8.2*  MG  --  1.9  --   --   --    GFR: Estimated Creatinine Clearance: 67.7 mL/min (by C-G formula based on SCr of 0.68 mg/dL). Liver Function Tests: Recent Labs  Lab 01/28/23 1321 01/29/23 0143 01/30/23 0227 01/31/23 0354 02/02/23 0214  AST 34 23 20 25 21   ALT 49* 35 29 29 28   ALKPHOS 71 61 61 76 70  BILITOT 2.8* 1.9* 1.4*  1.3* 0.9  PROT 7.0 5.9* 5.5* 6.6 6.0*  ALBUMIN 3.0* 2.5* 2.2* 2.5* 2.2*   Recent Labs  Lab 01/28/23 1321 01/28/23 1851  LIPASE 65*  --   AMYLASE  --  351*   Recent Labs  Lab 02/01/23 1234 02/01/23 1558 02/01/23 2051 02/02/23 0004 02/02/23 0409  GLUCAP 138* 134* 99 143* 135*   No results found for this or any previous visit (from the past 240 hour(s)).  Antimicrobials: Anti-infectives (From admission, onward)    None      Culture/Microbiology No results found for: "SDES", "SPECREQUEST", "CULT", "REPTSTATUS"  Other culture-see note  Radiology Studies: CT Angio Chest Pulmonary Embolism (PE) W or WO Contrast  Result Date: 02/01/2023 CLINICAL DATA:  Chest pain. EXAM: CT ANGIOGRAPHY CHEST WITH CONTRAST TECHNIQUE: Multidetector CT imaging of the chest was performed using the standard protocol during bolus administration of intravenous contrast. Multiplanar CT image reconstructions and MIPs were obtained to evaluate the vascular anatomy. RADIATION DOSE REDUCTION: This exam was performed according to the departmental dose-optimization program which includes automated exposure control, adjustment of the mA and/or kV according to patient size and/or use of iterative reconstruction  technique. CONTRAST:  75mL OMNIPAQUE IOHEXOL 350 MG/ML SOLN COMPARISON:  Chest radiograph dated 02/01/2023. CT dated 12/08/2018. FINDINGS: Cardiovascular: Mild cardiomegaly. No pericardial effusion. Three-vessel coronary vascular calcification. Moderate atherosclerotic calcification of the thoracic aorta. No aneurysmal dilatation. No pulmonary artery embolus identified. Mediastinum/Nodes: No hilar or mediastinal adenopathy. The esophagus is grossly unremarkable. No mediastinal fluid collection. Lungs/Pleura: Minimal bibasilar subpleural atelectasis/scarring. No focal consolidation, pleural effusion, or pneumothorax. The central airways are patent. Upper Abdomen: Partially visualized trace perihepatic fluid. Air in the central liver, likely pneumobilia. Musculoskeletal: Osteopenia.  No acute osseous pathology. Review of the MIP images confirms the above findings. IMPRESSION: 1. No CT evidence of pulmonary embolism. 2. Mild cardiomegaly. 3.  Aortic Atherosclerosis (ICD10-I70.0). Electronically Signed   By: Elgie Collard M.D.   On: 02/01/2023 20:19   DG Chest Port 1 View  Result Date: 02/01/2023 CLINICAL DATA:  [id number] Rib pain [id number] EXAM: PORTABLE CHEST 1 VIEW COMPARISON:  12/07/2022. FINDINGS: Bilateral lung fields are clear. Bilateral costophrenic angles are clear. Normal cardio-mediastinal silhouette. Aortic arch calcifications noted. No acute osseous abnormalities. The soft tissues are within normal limits. IMPRESSION: 1. No active disease. Aortic Atherosclerosis (ICD10-I70.0). Electronically Signed   By: Jules Schick M.D.   On: 02/01/2023 17:15     LOS: 5 days   Lanae Boast, MD [facility]  02/02/2023, 10:05 AM

## 2023-02-02 NOTE — Progress Notes (Signed)
[facility] Progress Note  CC:  Pancreatitis  Subjective:  Continues to have abdominal pain, but much improved.  Not eating much at all.  Just had Core Track placed.  Objective:  Vital signs in last 24 hours: Temp:  [97.7 F (36.5 C)-99.2 F (37.3 C)] 98.7 F (37.1 C) (07/31 0729) Pulse Rate:  [74-84] 81 (07/31 0729) Resp:  [18] 18 (07/31 0729) BP: (128-134)/(56-88) 128/71 (07/31 0729) SpO2:  [96 %-98 %] 96 % (07/31 0729) Weight:  [70.1 kg] 70.1 kg (07/31 0500) Last BM Date : 01/31/23 General:  Alert, in NAD Heart:  Regular rate and rhythm Pulm:  CTAB. Abdomen:  Soft, non-distended.  BS present.   Extremities:  Without edema. Neurologic:  Alert and  oriented x4;  grossly normal neurologically. Psych:  Alert and cooperative. Normal mood and affect.  Intake/Output from previous day: 07/30 0701 - 07/31 0700 In: 100 [P.O.:100] Out: -   Lab Results: Recent Labs    01/31/23 0354 02/02/23 0214  WBC 12.9* 12.7*  HGB 12.1* 11.1*  HCT 36.3* 33.2*  PLT 245 232   BMET Recent Labs    01/31/23 0354 02/02/23 0214  NA 135 132*  K 3.7 3.6  CL 104 101  CO2 23 20*  GLUCOSE 161* 157*  BUN 6* 8  CREATININE 0.80 0.68  CALCIUM 8.4* 8.2*   LFT Recent Labs    02/02/23 0214  PROT 6.0*  ALBUMIN 2.2*  AST 21  ALT 28  ALKPHOS 70  BILITOT 0.9   PT/INR No results for input(s): "LABPROT", "INR" in the last 72 hours. Hepatitis Panel No results for input(s): "HEPBSAG", "HCVAB", "HEPAIGM", "HEPBIGM" in the last 72 hours.  VAS [place] LOWER EXTREMITY VENOUS (DVT)  Result Date: 02/02/2023  Lower Venous DVT Study Patient Name:  Jon Bray [facility]  Date of Exam:   02/02/2023 Medical Rec #: [id number]     Accession #:    [id number] Date of Birth: [birth year]      Patient Gender: M Patient Age:   82 years Exam Location:  [facility] Procedure:      VAS [place] LOWER EXTREMITY VENOUS (DVT) Referring Phys: RAMESH KC  --------------------------------------------------------------------------------  Indications: Swelling.  Risk Factors: Recent Extended Hospital Stay Surgery 01/31/23. Comparison Study: No prior study Performing Technologist: Shona Simpson  Examination Guidelines: A complete evaluation includes B-mode imaging, spectral Doppler, color Doppler, and power Doppler as needed of all accessible portions of each vessel. Bilateral testing is considered an integral part of a complete examination. Limited examinations for reoccurring indications may be performed as noted. The reflux portion of the exam is performed with the patient in reverse Trendelenburg.  +---------+---------------+---------+-----------+----------+--------------+ RIGHT    CompressibilityPhasicitySpontaneityPropertiesThrombus Aging +---------+---------------+---------+-----------+----------+--------------+ CFV      Full           Yes      Yes                                 +---------+---------------+---------+-----------+----------+--------------+ SFJ      Full                                                        +---------+---------------+---------+-----------+----------+--------------+ FV Prox  Full                                                        +---------+---------------+---------+-----------+----------+--------------+  FV Mid   Full                                                        +---------+---------------+---------+-----------+----------+--------------+ FV DistalFull                                                        +---------+---------------+---------+-----------+----------+--------------+ PFV      Full                                                        +---------+---------------+---------+-----------+----------+--------------+ POP      Full           Yes      Yes                                 +---------+---------------+---------+-----------+----------+--------------+  PTV      Full                                                        +---------+---------------+---------+-----------+----------+--------------+ PERO     Full                                                        +---------+---------------+---------+-----------+----------+--------------+   +---------+---------------+---------+-----------+----------+--------------+ LEFT     CompressibilityPhasicitySpontaneityPropertiesThrombus Aging +---------+---------------+---------+-----------+----------+--------------+ CFV      Full           Yes      Yes                                 +---------+---------------+---------+-----------+----------+--------------+ SFJ      Full                                                        +---------+---------------+---------+-----------+----------+--------------+ FV Prox  Full                                                        +---------+---------------+---------+-----------+----------+--------------+ FV Mid   Full                                                        +---------+---------------+---------+-----------+----------+--------------+  FV DistalFull                                                        +---------+---------------+---------+-----------+----------+--------------+ PFV      Full                                                        +---------+---------------+---------+-----------+----------+--------------+ POP      Full           Yes      Yes                                 +---------+---------------+---------+-----------+----------+--------------+ PTV      Full                                                        +---------+---------------+---------+-----------+----------+--------------+ PERO     Full                                                        +---------+---------------+---------+-----------+----------+--------------+     Summary: BILATERAL: - No evidence of deep  vein thrombosis seen in the lower extremities, bilaterally. -No evidence of popliteal cyst, bilaterally.   *See table(s) above for measurements and observations.    Preliminary    DG Abd Portable 1V  Result Date: 02/02/2023 CLINICAL DATA:  Evaluate feeding tube placement EXAM: PORTABLE ABDOMEN - 1 VIEW COMPARISON:  CT 01/28/2023 FINDINGS: Interval placement of feeding tube. The tip appears post pyloric in the expected location of the distal portion of the descending duodenum. Surgical clips identified within the right upper quadrant of the abdomen. Aortic atherosclerotic calcifications identified IMPRESSION: Feeding tube tip appears post pyloric in the expected location of the distal portion of the descending duodenum. Electronically Signed   By: Signa Kell M.D.   On: 02/02/2023 12:57   CT Angio Chest Pulmonary Embolism (PE) W or WO Contrast  Result Date: 02/01/2023 CLINICAL DATA:  Chest pain. EXAM: CT ANGIOGRAPHY CHEST WITH CONTRAST TECHNIQUE: Multidetector CT imaging of the chest was performed using the standard protocol during bolus administration of intravenous contrast. Multiplanar CT image reconstructions and MIPs were obtained to evaluate the vascular anatomy. RADIATION DOSE REDUCTION: This exam was performed according to the departmental dose-optimization program which includes automated exposure control, adjustment of the mA and/or kV according to patient size and/or use of iterative reconstruction technique. CONTRAST:  75mL OMNIPAQUE IOHEXOL 350 MG/ML SOLN COMPARISON:  Chest radiograph dated 02/01/2023. CT dated 12/08/2018. FINDINGS: Cardiovascular: Mild cardiomegaly. No pericardial effusion. Three-vessel coronary vascular calcification. Moderate atherosclerotic calcification of the thoracic aorta. No aneurysmal dilatation. No pulmonary artery embolus identified. Mediastinum/Nodes: No hilar or mediastinal adenopathy. The esophagus is grossly unremarkable. No mediastinal fluid collection.  Lungs/Pleura: Minimal bibasilar subpleural atelectasis/scarring. No focal  consolidation, pleural effusion, or pneumothorax. The central airways are patent. Upper Abdomen: Partially visualized trace perihepatic fluid. Air in the central liver, likely pneumobilia. Musculoskeletal: Osteopenia.  No acute osseous pathology. Review of the MIP images confirms the above findings. IMPRESSION: 1. No CT evidence of pulmonary embolism. 2. Mild cardiomegaly. 3.  Aortic Atherosclerosis (ICD10-I70.0). Electronically Signed   By: Elgie Collard M.D.   On: 02/01/2023 20:19   DG Chest Port 1 View  Result Date: 02/01/2023 CLINICAL DATA:  [id number] Rib pain [id number] EXAM: PORTABLE CHEST 1 VIEW COMPARISON:  12/07/2022. FINDINGS: Bilateral lung fields are clear. Bilateral costophrenic angles are clear. Normal cardio-mediastinal silhouette. Aortic arch calcifications noted. No acute osseous abnormalities. The soft tissues are within normal limits. IMPRESSION: 1. No active disease. Aortic Atherosclerosis (ICD10-I70.0). Electronically Signed   By: Jules Schick M.D.   On: 02/01/2023 17:15    Assessment / Plan: 82 year old male with history of gallstone pancreatitis s/p lap cholecystectomy presented with ab pain and N&V, found to have acute pancreatitis.  EGD yesterday showed severe gastritis with biopsies that came back for ischemia.  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  Perhaps his gastric ischemia is due to poor perfusion from dehydration.  Patient has had several recent CT A/P that showed patent mesenteric vasculature.  Will continue to treat his ischemic gastritis with PPI therapy.  Patient is not eating well, had core track placed for post-pyloric tube feeding today.  It is is correct position by x-ray so ok to start using that, -Continue IV fluids and pain control -Continue PPI twice daily    LOS: 5 days   Princella Pellegrini. Naomy Esham  02/02/2023, 1:22 PM

## 2023-02-03 ENCOUNTER — Encounter (HOSPITAL_COMMUNITY): Payer: Self-pay | Admitting: Internal Medicine

## 2023-02-03 DIAGNOSIS — I2510 Atherosclerotic heart disease of native coronary artery without angina pectoris: Secondary | ICD-10-CM | POA: Diagnosis not present

## 2023-02-03 DIAGNOSIS — K859 Acute pancreatitis without necrosis or infection, unspecified: Secondary | ICD-10-CM | POA: Diagnosis not present

## 2023-02-03 DIAGNOSIS — E440 Moderate protein-calorie malnutrition: Secondary | ICD-10-CM | POA: Diagnosis not present

## 2023-02-03 DIAGNOSIS — K8591 Acute pancreatitis with uninfected necrosis, unspecified: Secondary | ICD-10-CM | POA: Diagnosis not present

## 2023-02-03 DIAGNOSIS — I480 Paroxysmal atrial fibrillation: Secondary | ICD-10-CM | POA: Diagnosis not present

## 2023-02-03 LAB — GLUCOSE, CAPILLARY
Glucose-Capillary: 123 mg/dL — ABNORMAL HIGH (ref 70–99)
Glucose-Capillary: 130 mg/dL — ABNORMAL HIGH (ref 70–99)
Glucose-Capillary: 140 mg/dL — ABNORMAL HIGH (ref 70–99)
Glucose-Capillary: 151 mg/dL — ABNORMAL HIGH (ref 70–99)

## 2023-02-03 NOTE — Plan of Care (Signed)
  Problem: Fluid Volume: Goal: Ability to maintain a balanced intake and output will improve Outcome: Progressing   Problem: Nutritional: Goal: Maintenance of adequate nutrition will improve Outcome: Progressing   Problem: Elimination: Goal: Will not experience complications related to bowel motility Outcome: Progressing

## 2023-02-03 NOTE — Progress Notes (Signed)
PROGRESS NOTE  THOMES CORPE FAO:[id number] DOB: 02/28/1941   PCP: Danella Penton, MD  Patient is from: Home  DOA: 01/28/2023 LOS: 6  Chief complaints No chief complaint on file.    Brief Narrative / Interim history: 82 y.o.m w/ CAD,PAF-not anticoagulated, and admission in May 2024 for acute gallstone pancreatitis s/p cholecystectomy on 12/03/2022 presented 01/28/23 with abdominal pain, nausea, inability to take adequate PO found to have AKI, dehydration. CT is concerning for severe acute pancreatitis with increase in number and size of fluid collections.  No biliary ductal dilatation noted on CT. GI was consulted. Underwent EGD 7/29 showing benign esophageal stenosis hiatal hernia gastric fundus with ulcerated mucosa congested.  Started on tube feed via cortrack on 7/31.  Remains on IV fluid as well.  Clinically improving.   Subjective: Seen and examined earlier this morning.  No major events overnight of this morning.  Feels better.  Tolerating tube feed.  Sitting on bedside chair eating cereal.  Abdominal pain improved.  Denies nausea or vomiting.  Small BM yesterday.  Objective: Vitals:   02/02/23 1649 02/02/23 2024 02/03/23 0615 02/03/23 0731  BP: (!) 99/53 125/77 114/60 124/72  Pulse: 75 78 84 85  Resp: 18 16 18    Temp: 98.7 F (37.1 C) (!) 97.5 F (36.4 C) 98.1 F (36.7 C) (!) 97.4 F (36.3 C)  TempSrc: Oral Oral  Oral  SpO2: 97% 98% 97% 95%  Weight:      Height:        Examination:  GENERAL: No apparent distress.  Nontoxic. HEENT: MMM.  Vision and hearing grossly intact.  NG tube in place. NECK: Supple.  No apparent JVD.  RESP:  No IWOB.  Fair aeration bilaterally. CVS:  RRR. Heart sounds normal.  ABD/GI/GU: BS+. Abd soft.  Very mild tenderness. MSK/EXT:  Moves extremities. No apparent deformity. No edema.  SKIN: no apparent skin lesion or wound NEURO: Awake, alert and oriented appropriately.  No apparent focal neuro deficit. PSYCH: Calm. Normal affect.    Procedures:  Underwent EGD 7/29 showing benign esophageal stenosis hiatal hernia gastric fundus with ulcerated mucosa congested.  Microbiology summarized: None  Assessment and plan: Principal Problem:   Acute pancreatitis Active Problems:   CAD (coronary artery disease)   PAF (paroxysmal atrial fibrillation) (HCC)   Nausea and vomiting   Abnormal CT scan   Malnutrition of moderate degree  Acute gallstone pancreatitis: History of acute gallbladder pancreatitis status postcholecystectomy in 11/2022.  CT abdomen and pelvis on 7/26 with severe acute pancreatitis in the head of the pancreas with interval increase in number and size of multiple loculated fluid collections.  LFT within normal.  Lipase improved from 1258-65.  No pancreatic necrosis.  Clinically improving. -On TF via cortrack as of 7/31. -On regular diet as well. -GI and RD following -Monitor for refeeding syndrome  AKI: Resolved.  Gastritis/esophagitis -Continue PPI  Normocytic anemia: Stable -Monitor intermittently  Hypokalemia: Resolved   Elevated D-dimer: Likely related to pancreatitis.  CT angio negative for PE.  Lower extremity venous Doppler negative for DVT.   CAD:No anginal symptoms. -Continue ASA 81mg .   PAF:Not anticoagulated or rate-control agents pta     Controlled NIDDM-2: A1c 5.8% in 11/2022. Recent Labs  Lab 02/02/23 1708 02/02/23 2023 02/03/23 0032 02/03/23 0510 02/03/23 0615  GLUCAP 137* 138* 151* 140* 130*  -Continue current insulin regimen  Moderate malnutrition/unintentional weight loss Body mass index is 23.5 kg/m. Nutrition Problem: Moderate Malnutrition Etiology: chronic illness (recurrennt pancreatitis) Signs/Symptoms: moderate fat  depletion, severe muscle depletion Interventions: Refer to RD note for recommendations   DVT prophylaxis:  enoxaparin (LOVENOX) injection 40 mg Start: 01/28/23 2115  Code Status: Full code Family Communication: Updated patient's wife at the  bedside Level of care: Med-Surg Status is: Inpatient Remains inpatient appropriate because: Acute pancreatitis   Final disposition: Likely home once medically stable Consultants:  Gastroenterology  35 minutes with more than 50% spent in reviewing records, counseling patient/family and coordinating care.   Sch Meds:  Scheduled Meds:  aspirin  81 mg Oral Daily   docusate sodium  100 mg Oral Daily   enoxaparin (LOVENOX) injection  40 mg Subcutaneous Q24H   famotidine  40 mg Oral QHS   feeding supplement (PROSource TF20)  60 mL Per Tube Daily   free water  150 mL Per Tube Q4H   insulin aspart  0-6 Units Subcutaneous Q4H   megestrol  20 mg Oral Daily   multivitamin with minerals  1 tablet Oral Daily   pantoprazole (PROTONIX) IV  40 mg Intravenous Q12H   polyethylene glycol  17 g Oral Daily   Continuous Infusions:  dextrose 5% lactated ringers with KCl 20 mEq/L Stopped (02/03/23 0516)   feeding supplement (OSMOLITE 1.5 CAL) 30 mL/hr at 02/03/23 0648   promethazine (PHENERGAN) injection (IM or IVPB) Stopped (01/30/23 2049)   PRN Meds:.acetaminophen **OR** acetaminophen, HYDROmorphone (DILAUDID) injection, ondansetron **OR** ondansetron (ZOFRAN) IV, promethazine (PHENERGAN) injection (IM or IVPB), traMADol  Antimicrobials: Anti-infectives (From admission, onward)    None        I have personally reviewed the following labs and images: CBC: Recent Labs  Lab 01/28/23 1321 01/29/23 0143 01/30/23 0227 01/31/23 0354 02/02/23 0214 02/03/23 0528  WBC 17.2* 12.8* 10.2 12.9* 12.7* 10.0  NEUTROABS 13.2*  --   --   --   --   --   HGB 11.8* 10.4* 9.5* 12.1* 11.1* 11.4*  HCT 36.9* 31.7* 28.5* 36.3* 33.2* 34.2*  MCV 89.3 88.8 84.6 86.6 83.6 83.0  PLT 235 175 179 245 232 250   BMP &GFR Recent Labs  Lab 01/29/23 0143 01/30/23 0227 01/31/23 0354 02/02/23 0214 02/02/23 1656 02/03/23 0528  NA 135 133* 135 132*  --  133*  K 3.4* 3.3* 3.7 3.6  --  3.7  CL 105 104 104 101  --   102  CO2 21* 21* 23 20*  --  22  GLUCOSE 93 143* 161* 157*  --  159*  BUN 23 14 6* 8  --  11  CREATININE 0.73 0.60* 0.80 0.68  --  0.67  CALCIUM 8.0* 7.7* 8.4* 8.2*  --  8.4*  MG 1.9  --   --   --  1.8 1.8  PHOS  --   --   --   --  2.9 3.7   Estimated Creatinine Clearance: 67.7 mL/min (by C-G formula based on SCr of 0.67 mg/dL). Liver & Pancreas: Recent Labs  Lab 01/29/23 0143 01/30/23 0227 01/31/23 0354 02/02/23 0214 02/03/23 0528  AST 23 20 25 21 18   ALT 35 29 29 28 25   ALKPHOS 61 61 76 70 66  BILITOT 1.9* 1.4* 1.3* 0.9 0.8  PROT 5.9* 5.5* 6.6 6.0* 6.5  ALBUMIN 2.5* 2.2* 2.5* 2.2* 2.3*   Recent Labs  Lab 01/28/23 1321 01/28/23 1851  LIPASE 65*  --   AMYLASE  --  351*   No results for input(s): "AMMONIA" in the last 168 hours. Diabetic: No results for input(s): "HGBA1C" in the last 72 hours.  Recent Labs  Lab 02/02/23 1708 02/02/23 2023 02/03/23 0032 02/03/23 0510 02/03/23 0615  GLUCAP 137* 138* 151* 140* 130*   Cardiac Enzymes: No results for input(s): "CKTOTAL", "CKMB", "CKMBINDEX", "TROPONINI" in the last 168 hours. No results for input(s): "PROBNP" in the last 8760 hours. Coagulation Profile: No results for input(s): "INR", "PROTIME" in the last 168 hours. Thyroid Function Tests: No results for input(s): "TSH", "T4TOTAL", "FREET4", "T3FREE", "THYROIDAB" in the last 72 hours. Lipid Profile: No results for input(s): "CHOL", "HDL", "LDLCALC", "TRIG", "CHOLHDL", "LDLDIRECT" in the last 72 hours. Anemia Panel: No results for input(s): "VITAMINB12", "FOLATE", "FERRITIN", "TIBC", "IRON", "RETICCTPCT" in the last 72 hours. Urine analysis:    Component Value Date/Time   COLORURINE AMBER (A) 01/28/2023 1312   APPEARANCEUR HAZY (A) 01/28/2023 1312   APPEARANCEUR Clear 08/28/2021 1113   LABSPEC 1.031 (H) 01/28/2023 1312   PHURINE 5.0 01/28/2023 1312   GLUCOSEU NEGATIVE 01/28/2023 1312   HGBUR NEGATIVE 01/28/2023 1312   BILIRUBINUR NEGATIVE 01/28/2023 1312    BILIRUBINUR Negative 08/28/2021 1113   KETONESUR NEGATIVE 01/28/2023 1312   PROTEINUR 100 (A) 01/28/2023 1312   NITRITE NEGATIVE 01/28/2023 1312   LEUKOCYTESUR NEGATIVE 01/28/2023 1312   Sepsis Labs: Invalid input(s): "PROCALCITONIN", "LACTICIDVEN"  Microbiology: No results found for this or any previous visit (from the past 240 hour(s)).  Radiology Studies: VAS [place] LOWER EXTREMITY VENOUS (DVT)  Result Date: 02/02/2023  Lower Venous DVT Study Patient Name:  Jon Bray [facility]  Date of Exam:   02/02/2023 Medical Rec #: [id number]     Accession #:    [id number] Date of Birth: 14-Mar-1941      Patient Gender: M Patient Age:   11 years Exam Location:  [facility] Procedure:      VAS [place] LOWER EXTREMITY VENOUS (DVT) Referring Phys: RAMESH KC --------------------------------------------------------------------------------  Indications: Swelling.  Risk Factors: Recent Extended Hospital Stay Surgery 01/31/23. Comparison Study: No prior study Performing Technologist: Shona Simpson  Examination Guidelines: A complete evaluation includes B-mode imaging, spectral Doppler, color Doppler, and power Doppler as needed of all accessible portions of each vessel. Bilateral testing is considered an integral part of a complete examination. Limited examinations for reoccurring indications may be performed as noted. The reflux portion of the exam is performed with the patient in reverse Trendelenburg.  +---------+---------------+---------+-----------+----------+--------------+ RIGHT    CompressibilityPhasicitySpontaneityPropertiesThrombus Aging +---------+---------------+---------+-----------+----------+--------------+ CFV      Full           Yes      Yes                                 +---------+---------------+---------+-----------+----------+--------------+ SFJ      Full                                                        +---------+---------------+---------+-----------+----------+--------------+ FV  Prox  Full                                                        +---------+---------------+---------+-----------+----------+--------------+ FV Mid   Full                                                        +---------+---------------+---------+-----------+----------+--------------+  FV DistalFull                                                        +---------+---------------+---------+-----------+----------+--------------+ PFV      Full                                                        +---------+---------------+---------+-----------+----------+--------------+ POP      Full           Yes      Yes                                 +---------+---------------+---------+-----------+----------+--------------+ PTV      Full                                                        +---------+---------------+---------+-----------+----------+--------------+ PERO     Full                                                        +---------+---------------+---------+-----------+----------+--------------+   +---------+---------------+---------+-----------+----------+--------------+ LEFT     CompressibilityPhasicitySpontaneityPropertiesThrombus Aging +---------+---------------+---------+-----------+----------+--------------+ CFV      Full           Yes      Yes                                 +---------+---------------+---------+-----------+----------+--------------+ SFJ      Full                                                        +---------+---------------+---------+-----------+----------+--------------+ FV Prox  Full                                                        +---------+---------------+---------+-----------+----------+--------------+ FV Mid   Full                                                        +---------+---------------+---------+-----------+----------+--------------+ FV DistalFull                                                         +---------+---------------+---------+-----------+----------+--------------+  PFV      Full                                                        +---------+---------------+---------+-----------+----------+--------------+ POP      Full           Yes      Yes                                 +---------+---------------+---------+-----------+----------+--------------+ PTV      Full                                                        +---------+---------------+---------+-----------+----------+--------------+ PERO     Full                                                        +---------+---------------+---------+-----------+----------+--------------+     Summary: BILATERAL: - No evidence of deep vein thrombosis seen in the lower extremities, bilaterally. -No evidence of popliteal cyst, bilaterally.   *See table(s) above for measurements and observations. Electronically signed by Heath Lark on 02/02/2023 at 5:34:12 PM.    Final       Miking Usrey T. Hasheem Voland [facility]  If 7PM-7AM, please contact night-coverage [email or web address] 02/03/2023, 12:11 PM

## 2023-02-03 NOTE — Progress Notes (Addendum)
Gastroenterology Inpatient Follow Up    Subjective: Patient states that his abdominal pain has improved significantly. Has tolerated tube feeds well.   Objective: Vital signs in last 24 hours: Temp:  [97.4 F (36.3 C)-98.7 F (37.1 C)] 97.4 F (36.3 C) (08/01 0731) Pulse Rate:  [75-85] 85 (08/01 0731) Resp:  [16-18] 18 (08/01 0615) BP: (99-125)/(53-77) 124/72 (08/01 0731) SpO2:  [95 %-98 %] 95 % (08/01 0731) Last BM Date : 02/02/23  Intake/Output from previous day: 07/31 0701 - 08/01 0700 In: 3306.4 [I.V.:2464.9; NG/GT:841.5] Out: 600 [Urine:600] Intake/Output this shift: No intake/output data recorded.  General appearance: alert and cooperative Resp: no increased WOB Cardio: regular rate GI: mildly tender in the RUQ and epigastric area  Lab Results: Recent Labs    02/02/23 0214 02/03/23 0528  WBC 12.7* 10.0  HGB 11.1* 11.4*  HCT 33.2* 34.2*  PLT 232 250   BMET Recent Labs    02/02/23 0214 02/03/23 0528  NA 132* 133*  K 3.6 3.7  CL 101 102  CO2 20* 22  GLUCOSE 157* 159*  BUN 8 11  CREATININE 0.68 0.67  CALCIUM 8.2* 8.4*   LFT Recent Labs    02/03/23 0528  PROT 6.5  ALBUMIN 2.3*  AST 18  ALT 25  ALKPHOS 66  BILITOT 0.8   PT/INR No results for input(s): "LABPROT", "INR" in the last 72 hours. Hepatitis Panel No results for input(s): "HEPBSAG", "HCVAB", "HEPAIGM", "HEPBIGM" in the last 72 hours. C-Diff No results for input(s): "CDIFFTOX" in the last 72 hours.  Studies/Results: VAS [place] LOWER EXTREMITY VENOUS (DVT)  Result Date: 02/02/2023  Lower Venous DVT Study Patient Name:  Jon Bray [facility]  Date of Exam:   02/02/2023 Medical Rec #: [id number]     Accession #:    [id number] Date of Birth: [birth year]      Patient Gender: M Patient Age:   82 years Exam Location:  [facility] Procedure:      VAS [place] LOWER EXTREMITY VENOUS (DVT) Referring Phys: RAMESH KC --------------------------------------------------------------------------------   Indications: Swelling.  Risk Factors: Recent Extended Hospital Stay Surgery 01/31/23. Comparison Study: No prior study Performing Technologist: Shona Simpson  Examination Guidelines: A complete evaluation includes B-mode imaging, spectral Doppler, color Doppler, and power Doppler as needed of all accessible portions of each vessel. Bilateral testing is considered an integral part of a complete examination. Limited examinations for reoccurring indications may be performed as noted. The reflux portion of the exam is performed with the patient in reverse Trendelenburg.  +---------+---------------+---------+-----------+----------+--------------+ RIGHT    CompressibilityPhasicitySpontaneityPropertiesThrombus Aging +---------+---------------+---------+-----------+----------+--------------+ CFV      Full           Yes      Yes                                 +---------+---------------+---------+-----------+----------+--------------+ SFJ      Full                                                        +---------+---------------+---------+-----------+----------+--------------+ FV Prox  Full                                                        +---------+---------------+---------+-----------+----------+--------------+  FV Mid   Full                                                        +---------+---------------+---------+-----------+----------+--------------+ FV DistalFull                                                        +---------+---------------+---------+-----------+----------+--------------+ PFV      Full                                                        +---------+---------------+---------+-----------+----------+--------------+ POP      Full           Yes      Yes                                 +---------+---------------+---------+-----------+----------+--------------+ PTV      Full                                                         +---------+---------------+---------+-----------+----------+--------------+ PERO     Full                                                        +---------+---------------+---------+-----------+----------+--------------+   +---------+---------------+---------+-----------+----------+--------------+ LEFT     CompressibilityPhasicitySpontaneityPropertiesThrombus Aging +---------+---------------+---------+-----------+----------+--------------+ CFV      Full           Yes      Yes                                 +---------+---------------+---------+-----------+----------+--------------+ SFJ      Full                                                        +---------+---------------+---------+-----------+----------+--------------+ FV Prox  Full                                                        +---------+---------------+---------+-----------+----------+--------------+ FV Mid   Full                                                        +---------+---------------+---------+-----------+----------+--------------+  FV DistalFull                                                        +---------+---------------+---------+-----------+----------+--------------+ PFV      Full                                                        +---------+---------------+---------+-----------+----------+--------------+ POP      Full           Yes      Yes                                 +---------+---------------+---------+-----------+----------+--------------+ PTV      Full                                                        +---------+---------------+---------+-----------+----------+--------------+ PERO     Full                                                        +---------+---------------+---------+-----------+----------+--------------+     Summary: BILATERAL: - No evidence of deep vein thrombosis seen in the lower extremities, bilaterally. -No evidence of  popliteal cyst, bilaterally.   *See table(s) above for measurements and observations. Electronically signed by Heath Lark on 02/02/2023 at 5:34:12 PM.    Final    DG Abd Portable 1V  Result Date: 02/02/2023 CLINICAL DATA:  Evaluate feeding tube placement EXAM: PORTABLE ABDOMEN - 1 VIEW COMPARISON:  CT 01/28/2023 FINDINGS: Interval placement of feeding tube. The tip appears post pyloric in the expected location of the distal portion of the descending duodenum. Surgical clips identified within the right upper quadrant of the abdomen. Aortic atherosclerotic calcifications identified IMPRESSION: Feeding tube tip appears post pyloric in the expected location of the distal portion of the descending duodenum. Electronically Signed   By: Signa Kell M.D.   On: 02/02/2023 12:57   CT Angio Chest Pulmonary Embolism (PE) W or WO Contrast  Result Date: 02/01/2023 CLINICAL DATA:  Chest pain. EXAM: CT ANGIOGRAPHY CHEST WITH CONTRAST TECHNIQUE: Multidetector CT imaging of the chest was performed using the standard protocol during bolus administration of intravenous contrast. Multiplanar CT image reconstructions and MIPs were obtained to evaluate the vascular anatomy. RADIATION DOSE REDUCTION: This exam was performed according to the departmental dose-optimization program which includes automated exposure control, adjustment of the mA and/or kV according to patient size and/or use of iterative reconstruction technique. CONTRAST:  75mL OMNIPAQUE IOHEXOL 350 MG/ML SOLN COMPARISON:  Chest radiograph dated 02/01/2023. CT dated 12/08/2018. FINDINGS: Cardiovascular: Mild cardiomegaly. No pericardial effusion. Three-vessel coronary vascular calcification. Moderate atherosclerotic calcification of the thoracic aorta. No aneurysmal dilatation. No pulmonary artery embolus identified. Mediastinum/Nodes: No hilar or mediastinal adenopathy. The esophagus is grossly unremarkable. No mediastinal  fluid collection. Lungs/Pleura: Minimal  bibasilar subpleural atelectasis/scarring. No focal consolidation, pleural effusion, or pneumothorax. The central airways are patent. Upper Abdomen: Partially visualized trace perihepatic fluid. Air in the central liver, likely pneumobilia. Musculoskeletal: Osteopenia.  No acute osseous pathology. Review of the MIP images confirms the above findings. IMPRESSION: 1. No CT evidence of pulmonary embolism. 2. Mild cardiomegaly. 3.  Aortic Atherosclerosis (ICD10-I70.0). Electronically Signed   By: Elgie Collard M.D.   On: 02/01/2023 20:19   DG Chest Port 1 View  Result Date: 02/01/2023 CLINICAL DATA:  [id number] Rib pain [id number] EXAM: PORTABLE CHEST 1 VIEW COMPARISON:  12/07/2022. FINDINGS: Bilateral lung fields are clear. Bilateral costophrenic angles are clear. Normal cardio-mediastinal silhouette. Aortic arch calcifications noted. No acute osseous abnormalities. The soft tissues are within normal limits. IMPRESSION: 1. No active disease. Aortic Atherosclerosis (ICD10-I70.0). Electronically Signed   By: Jules Schick M.D.   On: 02/01/2023 17:15    Medications: I have reviewed the patient's current medications. Scheduled:  aspirin  81 mg Oral Daily   docusate sodium  100 mg Oral Daily   enoxaparin (LOVENOX) injection  40 mg Subcutaneous Q24H   famotidine  40 mg Oral QHS   feeding supplement (PROSource TF20)  60 mL Per Tube Daily   free water  150 mL Per Tube Q4H   insulin aspart  0-6 Units Subcutaneous Q4H   megestrol  20 mg Oral Daily   multivitamin with minerals  1 tablet Oral Daily   pantoprazole (PROTONIX) IV  40 mg Intravenous Q12H   polyethylene glycol  17 g Oral Daily   Continuous:  dextrose 5% lactated ringers with KCl 20 mEq/L Stopped (02/03/23 0516)   feeding supplement (OSMOLITE 1.5 CAL) 30 mL/hr at 02/03/23 0648   promethazine (PHENERGAN) injection (IM or IVPB) Stopped (01/30/23 2049)   EAV:[id number] **OR** acetaminophen, HYDROmorphone (DILAUDID) injection, ondansetron **OR**  ondansetron (ZOFRAN) IV, promethazine (PHENERGAN) injection (IM or IVPB), traMADol  Assessment/Plan: 82 year old male with history of gallstone pancreatitis s/p lap cholecystectomy presented with ab pain and N&V, found to have acute pancreatitis and severe gastritis due to ischemia.  Patient's abdominal pain appears to be improving over time. Patient had a postpyloric tube placed yesterday successfully.  Tube feeds have been initiated and are being increased in rate.  -Continue IV fluids and pain control -Continue PPI twice daily -Will need to get tube feeds to goal rate.  This tube feeds will be continued until his GI follow-up with Dr. Meridee Score 02/22/23 -Okay to drink water but otherwise should be NPO. Can reassess this during follow up   LOS: 6 days   Imogene Burn 02/03/2023, 1:27 PM

## 2023-02-04 DIAGNOSIS — K859 Acute pancreatitis without necrosis or infection, unspecified: Secondary | ICD-10-CM | POA: Diagnosis not present

## 2023-02-04 DIAGNOSIS — E440 Moderate protein-calorie malnutrition: Secondary | ICD-10-CM | POA: Diagnosis not present

## 2023-02-04 DIAGNOSIS — I2510 Atherosclerotic heart disease of native coronary artery without angina pectoris: Secondary | ICD-10-CM | POA: Diagnosis not present

## 2023-02-04 DIAGNOSIS — I480 Paroxysmal atrial fibrillation: Secondary | ICD-10-CM | POA: Diagnosis not present

## 2023-02-04 DIAGNOSIS — K8591 Acute pancreatitis with uninfected necrosis, unspecified: Secondary | ICD-10-CM | POA: Diagnosis not present

## 2023-02-04 LAB — GLUCOSE, CAPILLARY
Glucose-Capillary: 166 mg/dL — ABNORMAL HIGH (ref 70–99)
Glucose-Capillary: 159 mg/dL — ABNORMAL HIGH (ref 70–99)
Glucose-Capillary: 189 mg/dL — ABNORMAL HIGH (ref 70–99)
Glucose-Capillary: 126 mg/dL — ABNORMAL HIGH (ref 70–99)
Glucose-Capillary: 142 mg/dL — ABNORMAL HIGH (ref 70–99)
Glucose-Capillary: 192 mg/dL — ABNORMAL HIGH (ref 70–99)

## 2023-02-04 LAB — MAGNESIUM: Magnesium: 1.9 mg/dL (ref 1.7–2.4)

## 2023-02-04 NOTE — Progress Notes (Signed)
PROGRESS NOTE  Jon Bray HKV:[id number] DOB: [birth year]   PCP: Danella Penton, MD  Patient is from: Home  DOA: 01/28/2023 LOS: 7  Chief complaints No chief complaint on file.    Brief Narrative / Interim history: 82 y.o.m w/ CAD,PAF-not anticoagulated, and admission in May 2024 for acute gallstone pancreatitis s/p cholecystectomy on 12/03/2022 presented 01/28/23 with abdominal pain, nausea, inability to take adequate PO found to have AKI, dehydration. CT is concerning for severe acute pancreatitis with increase in number and size of fluid collections.  No biliary ductal dilatation noted on CT. GI was consulted. Underwent EGD 7/29 showing benign esophageal stenosis hiatal hernia gastric fundus with ulcerated mucosa congested.  Started on tube feed via cortrack on 7/31.  Also on clear liquid diet.  Clinically improving.  He will be discharged with tube feed until follow-up with GI outpatient.   Subjective: Seen and examined earlier this morning.  No major events overnight of this morning.  No complaints.  Tolerating tube feed titration.  Also tolerating clear liquid diet.  [name] at bedside.  Objective: Vitals:   02/03/23 2010 02/04/23 0352 02/04/23 0500 02/04/23 0717  BP: 120/74 120/82  114/71  Pulse: 76 80  84  Resp: 18 18  18   Temp: 98 F (36.7 C) 98.4 F (36.9 C)  98.2 F (36.8 C)  TempSrc: Oral   Oral  SpO2: 99% 97%  97%  Weight:   69.1 kg   Height:        Examination:  GENERAL: No apparent distress.  Nontoxic. HEENT: MMM.  Vision and hearing grossly intact.  NG tube in place. NECK: Supple.  No apparent JVD.  RESP:  No IWOB.  Fair aeration bilaterally. CVS:  RRR. Heart sounds normal.  ABD/GI/GU: BS+. Abd soft.  No significant tenderness. MSK/EXT:  Moves extremities. No apparent deformity. No edema.  SKIN: no apparent skin lesion or wound NEURO: Awake, alert and oriented appropriately.  No apparent focal neuro deficit. PSYCH: Calm. Normal affect.    Procedures:  Underwent EGD 7/29 showing benign esophageal stenosis hiatal hernia gastric fundus with ulcerated mucosa congested.  Microbiology summarized: None  Assessment and plan: Principal Problem:   Acute pancreatitis Active Problems:   CAD (coronary artery disease)   PAF (paroxysmal atrial fibrillation) (HCC)   Nausea and vomiting   Abnormal CT scan   Malnutrition of moderate degree  Acute gallstone pancreatitis: History of acute gallbladder pancreatitis status postcholecystectomy in 11/2022.  CT abdomen and pelvis on 7/26 with severe acute pancreatitis in the head of the pancreas with interval increase in number and size of multiple loculated fluid collections.  LFT within normal.  Lipase improved from 1258-65.  No pancreatic necrosis.  Clinically improving. -On TF via cortrack as of 7/31.  Will be discharged on tube feed with cortrack until follow-up with GI on 8/20.  TOC notified. -Continue clear liquid diet. -Continue PPI twice daily. -Discontinue IV fluid. -Monitor for refeeding syndrome  AKI: Resolved.  Gastritis/esophagitis -Continue PPI twice daily.  Normocytic anemia: Stable -Monitor intermittently  Hypokalemia: Resolved   Elevated D-dimer: Likely related to pancreatitis.  CT angio negative for PE.  Lower extremity venous Doppler negative for DVT.   CAD:No anginal symptoms. -Continue ASA 81mg .   PAF:Not anticoagulated or rate-control agents pta     Controlled NIDDM-2: A1c 5.8% in 11/2022. Recent Labs  Lab 02/03/23 2014 02/03/23 2354 02/04/23 0357 02/04/23 0718 02/04/23 1122  GLUCAP 181* 142* 166* 159* 189*  -Continue current insulin regimen  Moderate  malnutrition/unintentional weight loss Body mass index is 23.16 kg/m. Nutrition Problem: Moderate Malnutrition Etiology: chronic illness (recurrennt pancreatitis) Signs/Symptoms: moderate fat depletion, severe muscle depletion Interventions: Refer to RD note for recommendations   DVT prophylaxis:   enoxaparin (LOVENOX) injection 40 mg Start: 01/28/23 2115  Code Status: Full code Family Communication: Updated patient's wife at the bedside Level of care: Med-Surg Status is: Inpatient Remains inpatient appropriate because: Acute pancreatitis   Final disposition: Likely home in the next 24 to 48 hours. Consultants:  Gastroenterology  35 minutes with more than 50% spent in reviewing records, counseling patient/family and coordinating care.   Sch Meds:  Scheduled Meds:  aspirin  81 mg Oral Daily   docusate sodium  100 mg Oral Daily   enoxaparin (LOVENOX) injection  40 mg Subcutaneous Q24H   famotidine  40 mg Oral QHS   feeding supplement (PROSource TF20)  60 mL Per Tube Daily   free water  150 mL Per Tube Q4H   insulin aspart  0-6 Units Subcutaneous Q4H   megestrol  20 mg Oral Daily   multivitamin with minerals  1 tablet Oral Daily   pantoprazole (PROTONIX) IV  40 mg Intravenous Q12H   polyethylene glycol  17 g Oral Daily   Continuous Infusions:  dextrose 5% lactated ringers with KCl 20 mEq/L Stopped (02/03/23 0516)   feeding supplement (OSMOLITE 1.5 CAL) 1,000 mL (02/04/23 0327)   promethazine (PHENERGAN) injection (IM or IVPB) Stopped (01/30/23 2049)   PRN Meds:.acetaminophen **OR** acetaminophen, HYDROmorphone (DILAUDID) injection, ondansetron **OR** ondansetron (ZOFRAN) IV, promethazine (PHENERGAN) injection (IM or IVPB), traMADol  Antimicrobials: Anti-infectives (From admission, onward)    None        I have personally reviewed the following labs and images: CBC: Recent Labs  Lab 01/30/23 0227 01/31/23 0354 02/02/23 0214 02/03/23 0528 02/04/23 0920  WBC 10.2 12.9* 12.7* 10.0 10.2  HGB 9.5* 12.1* 11.1* 11.4* 10.9*  HCT 28.5* 36.3* 33.2* 34.2* 33.7*  MCV 84.6 86.6 83.6 83.0 85.3  PLT 179 245 232 250 253   BMP &GFR Recent Labs  Lab 01/29/23 0143 01/30/23 0227 01/31/23 0354 02/02/23 0214 02/02/23 1656 02/03/23 0528 02/03/23 1735 02/04/23 0920   NA 135 133* 135 132*  --  133*  --  137  K 3.4* 3.3* 3.7 3.6  --  3.7  --  4.9  CL 105 104 104 101  --  102  --  101  CO2 21* 21* 23 20*  --  22  --  23  GLUCOSE 93 143* 161* 157*  --  159*  --  204*  BUN 23 14 6* 8  --  11  --  11  CREATININE 0.73 0.60* 0.80 0.68  --  0.67  --  0.83  CALCIUM 8.0* 7.7* 8.4* 8.2*  --  8.4*  --  8.6*  MG 1.9  --   --   --  1.8 1.8 1.9 1.9  PHOS  --   --   --   --  2.9 3.7 3.7  --    Estimated Creatinine Clearance: 65.2 mL/min (by C-G formula based on SCr of 0.83 mg/dL). Liver & Pancreas: Recent Labs  Lab 01/30/23 0227 01/31/23 0354 02/02/23 0214 02/03/23 0528 02/04/23 0920  AST 20 25 21 18 20   ALT 29 29 28 25 24   ALKPHOS 61 76 70 66 65  BILITOT 1.4* 1.3* 0.9 0.8 0.5  PROT 5.5* 6.6 6.0* 6.5 5.9*  ALBUMIN 2.2* 2.5* 2.2* 2.3* 2.3*   Recent Labs  Lab 01/28/23 1851  AMYLASE 351*   No results for input(s): "AMMONIA" in the last 168 hours. Diabetic: No results for input(s): "HGBA1C" in the last 72 hours. Recent Labs  Lab 02/03/23 2014 02/03/23 2354 02/04/23 0357 02/04/23 0718 02/04/23 1122  GLUCAP 181* 142* 166* 159* 189*   Cardiac Enzymes: No results for input(s): "CKTOTAL", "CKMB", "CKMBINDEX", "TROPONINI" in the last 168 hours. No results for input(s): "PROBNP" in the last 8760 hours. Coagulation Profile: No results for input(s): "INR", "PROTIME" in the last 168 hours. Thyroid Function Tests: No results for input(s): "TSH", "T4TOTAL", "FREET4", "T3FREE", "THYROIDAB" in the last 72 hours. Lipid Profile: No results for input(s): "CHOL", "HDL", "LDLCALC", "TRIG", "CHOLHDL", "LDLDIRECT" in the last 72 hours. Anemia Panel: No results for input(s): "VITAMINB12", "FOLATE", "FERRITIN", "TIBC", "IRON", "RETICCTPCT" in the last 72 hours. Urine analysis:    Component Value Date/Time   COLORURINE AMBER (A) 01/28/2023 1312   APPEARANCEUR HAZY (A) 01/28/2023 1312   APPEARANCEUR Clear 08/28/2021 1113   LABSPEC 1.031 (H) 01/28/2023 1312    PHURINE 5.0 01/28/2023 1312   GLUCOSEU NEGATIVE 01/28/2023 1312   HGBUR NEGATIVE 01/28/2023 1312   BILIRUBINUR NEGATIVE 01/28/2023 1312   BILIRUBINUR Negative 08/28/2021 1113   KETONESUR NEGATIVE 01/28/2023 1312   PROTEINUR 100 (A) 01/28/2023 1312   NITRITE NEGATIVE 01/28/2023 1312   LEUKOCYTESUR NEGATIVE 01/28/2023 1312   Sepsis Labs: Invalid input(s): "PROCALCITONIN", "LACTICIDVEN"  Microbiology: No results found for this or any previous visit (from the past 240 hour(s)).  Radiology Studies: No results found.     T.  [facility]  If 7PM-7AM, please contact night-coverage [email or web address] 02/04/2023, 2:53 PM

## 2023-02-04 NOTE — TOC Initial Note (Signed)
Transition of Care [facility]) - Initial/Assessment Note    Patient Details  Name: Jon Bray MRN: [id number] Date of Birth: [birth year]  [facility] CM/SW Contact:    Kermit Balo, RN Phone Number: 02/04/2023, 3:23 PM  Clinical Narrative:                 To is from home with spouse. He will dc home with cortrak and tube feeds per MD.  [facility] RN.  Pam with [facility] is following to educate and provide tube feeds and pump to pt for home.  HTA has not approved the tube feeds and per [employer] this will take until Monday. CM has updated the MD, pt and spouse.  TOC following.  Expected Discharge Plan: Home w Home Health Services Barriers to Discharge: Continued Medical Work up   Patient Goals and CMS Choice   CMS Medicare.gov Compare Post Acute Care list provided to:: Patient Choice offered to / list presented to : Patient, Spouse      Expected Discharge Plan and Services   Discharge Planning Services: CM Consult Post Acute Care Choice: Home Health Living arrangements for the past 2 months: Single Family Home                           HH Arranged: RN [facility] Agency: [facility] Date [facility] Agency Contacted: 02/04/23   Representative spoke with at [facility] Agency: Haywood Lasso  Prior Living Arrangements/Services Living arrangements for the past 2 months: Single Family Home Lives with:: Spouse Patient language and need for interpreter reviewed:: Yes Do you feel safe going back to the place where you live?: Yes        Care giver support system in place?: Yes (comment)   Criminal Activity/Legal Involvement Pertinent to Current Situation/Hospitalization: No - Comment as needed  Activities of Daily Living Home Assistive Devices/Equipment: None ADL Screening (condition at time of admission) Patient's cognitive ability adequate to safely complete daily activities?: Yes Is the patient deaf or have difficulty hearing?: No Does the patient have difficulty  seeing, even when wearing glasses/contacts?: No Does the patient have difficulty concentrating, remembering, or making decisions?: No Patient able to express need for assistance with ADLs?: Yes Does the patient have difficulty dressing or bathing?: No Independently performs ADLs?: Yes (appropriate for developmental age) Does the patient have difficulty walking or climbing stairs?: No Weakness of Legs: None Weakness of Arms/Hands: None  Permission Sought/Granted                  Emotional Assessment Appearance:: Appears stated age Attitude/Demeanor/Rapport: Engaged Affect (typically observed): Accepting Orientation: : Oriented to Self, Oriented to Place, Oriented to  Time, Oriented to Situation   Psych Involvement: No (comment)  Admission diagnosis:  Acute pancreatitis [K85.90] Acute pancreatitis with infected necrosis, unspecified pancreatitis type [K85.92] Patient Active Problem List   Diagnosis Date Noted   Malnutrition of moderate degree 02/01/2023   Nausea and vomiting 01/31/2023   Abnormal CT scan 01/31/2023   Acute pancreatitis 01/28/2023   Chronic diarrhea 12/05/2022   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   Hemorrhagic pancreatitis 11/30/2022   History of ERCP 11/30/2022   Biliary obstruction 11/30/2022   Moderate protein-calorie malnutrition (HCC) 11/30/2022   Gallstone pancreatitis 11/30/2022   Choledocholithiasis 11/21/2022   Acute gallstone pancreatitis 11/17/2022   PAF (paroxysmal atrial fibrillation) (HCC) 11/16/2021   Type 2 diabetes mellitus with peripheral angiopathy (HCC) 05/15/2018   Hyperlipidemia, mixed  10/28/2015   CAD (coronary artery disease) 05/12/2013   GERD (gastroesophageal reflux disease) 05/12/2013   PCP:  Danella Penton, MD Pharmacy:   [id number] [place], [place] - [address] AT [facility] OF [address] [place] [address] Phone: [phone] Fax: [phone]  [facility], [place] - [address] [place] [address] Phone: [phone] Fax: [phone]  [facility] [address] [facility] - [place], [place] - [address] [facility] AT [facility] [address] [place] [address] Phone: [phone] Fax: [phone]  [facility] - [place], [place] - [address]e [place] [address] Phone: [phone] Fax: [phone]     Social Determinants of Health (SDOH) Social History: SDOH Screenings   Food Insecurity: No Food Insecurity (01/28/2023)  Housing: Low Risk  (01/28/2023)  Transportation Needs: No Transportation Needs (01/28/2023)  Utilities: Not At Risk (01/28/2023)  Depression (PHQ2-9): Low Risk  (11/17/2022)  Financial Resource Strain: Low Risk  (11/17/2022)  Physical Activity: Sufficiently Active (11/17/2022)  Social Connections: Socially Integrated (11/17/2022)  Stress: No Stress Concern Present (11/17/2022)  Tobacco Use: Low Risk  (01/31/2023)  Recent Concern: Tobacco Use - Medium Risk (01/25/2023)   Received from [facility]   SDOH Interventions:     Readmission Risk Interventions     No data to display

## 2023-02-04 NOTE — Plan of Care (Signed)

## 2023-02-04 NOTE — Progress Notes (Signed)
Gastroenterology Inpatient Follow Up    Subjective: Patient feels well today.  He only has mild tenderness in his abdomen but overall feels a lot better than when he first came in the hospital.  His tube feeds are currently at 50 mL/h and are continuing to be rate escalated.  He has been drinking some clear liquids  Objective: Vital signs in last 24 hours: Temp:  [97.7 F (36.5 C)-98.4 F (36.9 C)] 98.2 F (36.8 C) (08/02 0717) Pulse Rate:  [72-84] 84 (08/02 0717) Resp:  [18] 18 (08/02 0717) BP: (114-123)/(71-82) 114/71 (08/02 0717) SpO2:  [97 %-99 %] 97 % (08/02 0717) Weight:  [69.1 kg] 69.1 kg (08/02 0500) Last BM Date : 02/02/23  Intake/Output from previous day: 08/01 0701 - 08/02 0700 In: 342 [NG/GT:342] Out: -  Intake/Output this shift: No intake/output data recorded.  General appearance: alert and cooperative Resp: no increased WOB Cardio: regular rate GI: mildly tender in the RUQ and epigastric area  Lab Results: Recent Labs    02/02/23 0214 02/03/23 0528 02/04/23 0920  WBC 12.7* 10.0 10.2  HGB 11.1* 11.4* 10.9*  HCT 33.2* 34.2* 33.7*  PLT 232 250 253   BMET Recent Labs    02/02/23 0214 02/03/23 0528 02/04/23 0920  NA 132* 133* 137  K 3.6 3.7 4.9  CL 101 102 101  CO2 20* 22 23  GLUCOSE 157* 159* 204*  BUN 8 11 11   CREATININE 0.68 0.67 0.83  CALCIUM 8.2* 8.4* 8.6*   LFT Recent Labs    02/04/23 0920  PROT 5.9*  ALBUMIN 2.3*  AST 20  ALT 24  ALKPHOS 65  BILITOT 0.5   PT/INR No results for input(s): "LABPROT", "INR" in the last 72 hours. Hepatitis Panel No results for input(s): "HEPBSAG", "HCVAB", "HEPAIGM", "HEPBIGM" in the last 72 hours. C-Diff No results for input(s): "CDIFFTOX" in the last 72 hours.  Studies/Results: No results found.  Medications: I have reviewed the patient's current medications. Scheduled:  aspirin  81 mg Oral Daily   docusate sodium  100 mg Oral Daily   enoxaparin (LOVENOX) injection  40 mg  Subcutaneous Q24H   famotidine  40 mg Oral QHS   feeding supplement (PROSource TF20)  60 mL Per Tube Daily   free water  150 mL Per Tube Q4H   insulin aspart  0-6 Units Subcutaneous Q4H   megestrol  20 mg Oral Daily   multivitamin with minerals  1 tablet Oral Daily   pantoprazole (PROTONIX) IV  40 mg Intravenous Q12H   polyethylene glycol  17 g Oral Daily   Continuous:  dextrose 5% lactated ringers with KCl 20 mEq/L Stopped (02/03/23 0516)   feeding supplement (OSMOLITE 1.5 CAL) 1,000 mL (02/04/23 0327)   promethazine (PHENERGAN) injection (IM or IVPB) Stopped (01/30/23 2049)   ZOX:[id number] **OR** acetaminophen, HYDROmorphone (DILAUDID) injection, ondansetron **OR** ondansetron (ZOFRAN) IV, promethazine (PHENERGAN) injection (IM or IVPB), traMADol  Assessment/Plan: 82 year old male with history of gallstone pancreatitis s/p lap cholecystectomy presented with ab pain and N&V, found to have acute pancreatitis and severe gastritis due to ischemia.  Patient's abdominal pain appears to be improving over time.  Patient had postpyloric tube feeding initiated and is having his tube feeds increased to the goal rate at this time.  He seems to to be tolerating the tube feeds well.  He is continuing on some clear liquids for comfort.  -Continue supportive care -Continue PPI twice daily for at least 10 weeks to aid with healing of ischemic  gastritis -Consider repeat EGD in the future to assess for healing of ischemic gastritis -Will need to get postpyloric tube feeds to goal rate.  These tube feeds will be continued at least until his GI follow-up with Dr. Meridee Score 02/22/23.  We will message Dr. Meridee Score to let him know about the patient discharging likely tomorrow -Clear liquids for comfort -GI will sign off for now.  Please call back if any new questions arise.   LOS: 7 days   Imogene Burn 02/04/2023, 2:41 PM

## 2023-02-05 DIAGNOSIS — I480 Paroxysmal atrial fibrillation: Secondary | ICD-10-CM | POA: Diagnosis not present

## 2023-02-05 DIAGNOSIS — K8591 Acute pancreatitis with uninfected necrosis, unspecified: Secondary | ICD-10-CM | POA: Diagnosis not present

## 2023-02-05 DIAGNOSIS — E440 Moderate protein-calorie malnutrition: Secondary | ICD-10-CM | POA: Diagnosis not present

## 2023-02-05 DIAGNOSIS — I2510 Atherosclerotic heart disease of native coronary artery without angina pectoris: Secondary | ICD-10-CM | POA: Diagnosis not present

## 2023-02-05 LAB — GLUCOSE, CAPILLARY
Glucose-Capillary: 225 mg/dL — ABNORMAL HIGH (ref 70–99)
Glucose-Capillary: 279 mg/dL — ABNORMAL HIGH (ref 70–99)
Glucose-Capillary: 172 mg/dL — ABNORMAL HIGH (ref 70–99)
Glucose-Capillary: 213 mg/dL — ABNORMAL HIGH (ref 70–99)
Glucose-Capillary: 142 mg/dL — ABNORMAL HIGH (ref 70–99)

## 2023-02-05 MED ORDER — METOCLOPRAMIDE HCL 5 MG/ML IJ SOLN: 5.0000 mg | Freq: Three times a day (TID) | INTRAMUSCULAR | Status: DC | PRN

## 2023-02-05 MED ORDER — TRAMADOL HCL 50 MG PO TABS: 50.0000 mg | ORAL_TABLET | Freq: Two times a day (BID) | ORAL | Status: DC | PRN

## 2023-02-05 MED ORDER — ONDANSETRON HCL 4 MG/2ML IJ SOLN: 4.0000 mg | Freq: Four times a day (QID) | INTRAMUSCULAR | Status: DC | PRN

## 2023-02-05 MED ORDER — INSULIN ASPART 100 UNIT/ML IJ SOLN
0.0000 [IU] | INTRAMUSCULAR | Status: DC
  Administered 2023-02-05: 3 [IU] via SUBCUTANEOUS
  Administered 2023-02-06: 2 [IU] via SUBCUTANEOUS
  Administered 2023-02-06: 3 [IU] via SUBCUTANEOUS
  Administered 2023-02-06 (×2): 2 [IU] via SUBCUTANEOUS

## 2023-02-05 NOTE — Progress Notes (Signed)
PROGRESS NOTE  Jon Bray ZOX:[id number] DOB: [birth year]   PCP: Danella Penton, MD  Patient is from: Home  DOA: 01/28/2023 LOS: 8  Chief complaints No chief complaint on file.    Brief Narrative / Interim history: 82 y.o.m w/ CAD,PAF-not anticoagulated, and admission in May 2024 for acute gallstone pancreatitis s/p cholecystectomy on 12/03/2022 presented 01/28/23 with abdominal pain, nausea, inability to take adequate PO found to have AKI, dehydration. CT is concerning for severe acute pancreatitis with increase in number and size of fluid collections.  No biliary ductal dilatation noted on CT. GI was consulted. Underwent EGD 7/29 showing benign esophageal stenosis hiatal hernia gastric fundus with ulcerated mucosa congested.  Started on tube feed via cortrack on 7/31.  Also on clear liquid diet.  Clinically improving.  He will be discharged with tube feed until follow-up with GI outpatient.   Subjective: Seen and examined earlier this morning.  Feels fatigued and tired.  Also felt nauseous earlier this morning that has resolved with IV Zofran.  Denies chest pain, dyspnea, cough, dizziness, vomiting, diarrhea or UTI symptoms.  He has mild leukocytosis but no fever or hemodynamic change.  [name] at bedside.  Objective: Vitals:   02/04/23 0717 02/04/23 1948 02/05/23 0505 02/05/23 0734  BP: 114/71 121/63 134/79 127/77  Pulse: 84 78 87 97  Resp: 18   18  Temp: 98.2 F (36.8 C) 97.7 F (36.5 C) 97.6 F (36.4 C) 98.5 F (36.9 C)  TempSrc: Oral Oral Oral Oral  SpO2: 97% 96% 96% 97%  Weight:   69.4 kg   Height:        Examination:  GENERAL: No apparent distress.  Nontoxic. HEENT: MMM.  Vision and hearing grossly intact.  NG tube in place. NECK: Supple.  No apparent JVD.  RESP:  No IWOB.  Fair aeration bilaterally. CVS:  RRR. Heart sounds normal.  ABD/GI/GU: BS+. Abd soft.  No significant tenderness. MSK/EXT:  Moves extremities. No apparent deformity. No edema.  SKIN: no  apparent skin lesion or wound NEURO: Awake but not quite alert.  Oriented appropriately.  No apparent focal neuro deficit. PSYCH: Calm. Normal affect.   Procedures:  Underwent EGD 7/29 showing benign esophageal stenosis hiatal hernia gastric fundus with ulcerated mucosa congested.  Microbiology summarized: None  Assessment and plan: Principal Problem:   Acute pancreatitis Active Problems:   CAD (coronary artery disease)   PAF (paroxysmal atrial fibrillation) (HCC)   Nausea and vomiting   Abnormal CT scan   Malnutrition of moderate degree  Acute gallstone pancreatitis: History of acute gallbladder pancreatitis status postcholecystectomy in 11/2022.  CT abdomen and pelvis on 7/26 with severe acute pancreatitis in the head of the pancreas with interval increase in number and size of multiple loculated fluid collections.  LFT within normal.  Lipase improved from 1258-65.  No pancreatic necrosis.  Clinically improving. -On TF via cortrack as of 7/31.  Will be discharged on tube feed with cortrack until follow-up with GI on 8/20.   -TOC working on home health and tube feed. -Continue clear liquid diet. -Continue PPI twice daily. -Monitor for refeeding syndrome  AKI: Resolved.  Gastritis/esophagitis -Continue PPI twice daily.  Normocytic anemia: Stable -Monitor intermittently  Hypokalemia: Resolved   Elevated D-dimer: Likely related to pancreatitis.  CT angio negative for PE.  Lower extremity venous Doppler negative for DVT.   CAD:No anginal symptoms. -Continue ASA 81mg .   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 Recent Labs  Lab 02/04/23 2004 02/04/23 2325 02/05/23 0508 02/05/23 0734 02/05/23 1120  GLUCAP 142* 192* 225* 279* 172*  -Increase SSI to sensitive  Fatigue/physical deconditioning:  -PT/OT. -Encourage OOB -Ambulate in the hall 3 times a day  Mild leukocytosis: Likely demargination but needs close  monitoring.  Low threshold for CT abdomen and pelvis  Moderate malnutrition/unintentional weight loss Body mass index is 23.26 kg/m. Nutrition Problem: Moderate Malnutrition Etiology: chronic illness (recurrennt pancreatitis) Signs/Symptoms: moderate fat depletion, severe muscle depletion Interventions: Refer to RD note for recommendations   DVT prophylaxis:  enoxaparin (LOVENOX) injection 40 mg Start: 01/28/23 2115  Code Status: Full code Family Communication: Updated patient's wife at the bedside Level of care: Med-Surg Status is: Inpatient Remains inpatient appropriate because: Acute pancreatitis   Final disposition: Home after 8/5 once home tube feed sorted out. Consultants:  Gastroenterology  35 minutes with more than 50% spent in reviewing records, counseling patient/family and coordinating care.   Sch Meds:  Scheduled Meds:  aspirin  81 mg Oral Daily   docusate sodium  100 mg Oral Daily   enoxaparin (LOVENOX) injection  40 mg Subcutaneous Q24H   famotidine  40 mg Oral QHS   feeding supplement (PROSource TF20)  60 mL Per Tube Daily   free water  150 mL Per Tube Q4H   insulin aspart  0-6 Units Subcutaneous Q4H   multivitamin with minerals  1 tablet Oral Daily   pantoprazole (PROTONIX) IV  40 mg Intravenous Q12H   polyethylene glycol  17 g Oral Daily   Continuous Infusions:  feeding supplement (OSMOLITE 1.5 CAL) 60 mL/hr at 02/04/23 1500   PRN Meds:.acetaminophen **OR** acetaminophen, HYDROmorphone (DILAUDID) injection, metoCLOPramide (REGLAN) injection, [DISCONTINUED] ondansetron **OR** ondansetron (ZOFRAN) IV, traMADol  Antimicrobials: Anti-infectives (From admission, onward)    None        I have personally reviewed the following labs and images: CBC: Recent Labs  Lab 01/31/23 0354 02/02/23 0214 02/03/23 0528 02/04/23 0920 02/05/23 0116  WBC 12.9* 12.7* 10.0 10.2 13.7*  HGB 12.1* 11.1* 11.4* 10.9* 11.7*  HCT 36.3* 33.2* 34.2* 33.7* 36.1*  MCV  86.6 83.6 83.0 85.3 84.1  PLT 245 232 250 253 303   BMP &GFR Recent Labs  Lab 01/31/23 0354 02/02/23 0214 02/02/23 1656 02/03/23 0528 02/03/23 1735 02/04/23 0920 02/05/23 0116  NA 135 132*  --  133*  --  137 133*  K 3.7 3.6  --  3.7  --  4.9 4.0  CL 104 101  --  102  --  101 102  CO2 23 20*  --  22  --  23 22  GLUCOSE 161* 157*  --  159*  --  204* 174*  BUN 6* 8  --  11  --  11 15  CREATININE 0.80 0.68  --  0.67  --  0.83 0.80  CALCIUM 8.4* 8.2*  --  8.4*  --  8.6* 8.4*  MG  --   --  1.8 1.8 1.9 1.9 2.0  PHOS  --   --  2.9 3.7 3.7  --  3.5   Estimated Creatinine Clearance: 67.7 mL/min (by C-G formula based on SCr of 0.8 mg/dL). Liver & Pancreas: Recent Labs  Lab 01/31/23 0354 02/02/23 0214 02/03/23 0528 02/04/23 0920 02/05/23 0116  AST 25 21 18 20 23   ALT 29 28 25 24 30   ALKPHOS 76 70 66 65 73  BILITOT 1.3* 0.9 0.8 0.5 0.7  PROT 6.6 6.0* 6.5 5.9* 6.5  ALBUMIN 2.5* 2.2* 2.3* 2.3* 2.4*   No results for input(s): "LIPASE", "AMYLASE" in the last 168 hours.  No results for input(s): "AMMONIA" in the last 168 hours. Diabetic: No results for input(s): "HGBA1C" in the last 72 hours. Recent Labs  Lab 02/04/23 2004 02/04/23 2325 02/05/23 0508 02/05/23 0734 02/05/23 1120  GLUCAP 142* 192* 225* 279* 172*   Cardiac Enzymes: No results for input(s): "CKTOTAL", "CKMB", "CKMBINDEX", "TROPONINI" in the last 168 hours. No results for input(s): "PROBNP" in the last 8760 hours. Coagulation Profile: No results for input(s): "INR", "PROTIME" in the last 168 hours. Thyroid Function Tests: No results for input(s): "TSH", "T4TOTAL", "FREET4", "T3FREE", "THYROIDAB" in the last 72 hours. Lipid Profile: No results for input(s): "CHOL", "HDL", "LDLCALC", "TRIG", "CHOLHDL", "LDLDIRECT" in the last 72 hours. Anemia Panel: No results for input(s): "VITAMINB12", "FOLATE", "FERRITIN", "TIBC", "IRON", "RETICCTPCT" in the last 72 hours. Urine analysis:    Component Value Date/Time    COLORURINE AMBER (A) 01/28/2023 1312   APPEARANCEUR HAZY (A) 01/28/2023 1312   APPEARANCEUR Clear 08/28/2021 1113   LABSPEC 1.031 (H) 01/28/2023 1312   PHURINE 5.0 01/28/2023 1312   GLUCOSEU NEGATIVE 01/28/2023 1312   HGBUR NEGATIVE 01/28/2023 1312   BILIRUBINUR NEGATIVE 01/28/2023 1312   BILIRUBINUR Negative 08/28/2021 1113   KETONESUR NEGATIVE 01/28/2023 1312   PROTEINUR 100 (A) 01/28/2023 1312   NITRITE NEGATIVE 01/28/2023 1312   LEUKOCYTESUR NEGATIVE 01/28/2023 1312   Sepsis Labs: Invalid input(s): "PROCALCITONIN", "LACTICIDVEN"  Microbiology: No results found for this or any previous visit (from the past 240 hour(s)).  Radiology Studies: No results found.     T.  [facility]  If 7PM-7AM, please contact night-coverage [email or web address] 02/05/2023, 2:25 PM

## 2023-02-05 NOTE — Evaluation (Signed)
Physical Therapy Evaluation Patient Details Name: Jon Bray MRN: [id number] DOB: 12-01-1940 Today's Date: 02/05/2023  History of Present Illness  82 y.o. male presents to [facility] hospital on 01/28/2023 with abdominal pain, nausea and poor PO intake, found to have AKI. CT concerning for acute pancreatitis. EGD on 7/29 showing benign esophageal stenosis hiatal hernia gastric fundus with ulcerated mucosa congested. PMH significant for CAD, hypertension, afib, HLD, chronic renal insufficiency, nephrolithiasis, cholelithiasis, gallstone pancreatitis with retained biliary stone.  Clinical Impression  Pt presents to PT with deficits in strength, power, endurance, gait, balance. Pt is limited by general fatigue and weakness, with recent 25lb weight loss and poor PO intake. Pt remains able to ambulate for household distances but is far from his baseline of golfing 2x/week and performing yard work. Pt will benefit from frequent mobilization in an effort to improve activity tolerance and reduce falls risk.         If plan is discharge home, recommend the following: A little help with bathing/dressing/bathroom;Assistance with cooking/housework;Help with stairs or ramp for entrance   Can travel by private vehicle        Equipment Recommendations None recommended by PT  Recommendations for Other Services       Functional Status Assessment Patient has had a recent decline in their functional status and demonstrates the ability to make significant improvements in function in a reasonable and predictable amount of time.     Precautions / Restrictions Precautions Precautions: Fall Restrictions Weight Bearing Restrictions: No      Mobility  Bed Mobility Overal bed mobility: Modified Independent                  Transfers Overall transfer level: Needs assistance Equipment used: None Transfers: Sit to/from Stand Sit to Stand: Supervision                Ambulation/Gait Ambulation/Gait  assistance: Supervision Gait Distance (Feet): 200 Feet Assistive device: None Gait Pattern/deviations: Step-through pattern Gait velocity: functional Gait velocity interpretation: 1.31 - 2.62 ft/sec, indicative of limited community ambulator   General Gait Details: slowed step-through gait  Stairs            Wheelchair Mobility     Tilt Bed    Modified Rankin (Stroke Patients Only)       Balance Overall balance assessment: Needs assistance Sitting-balance support: No upper extremity supported, Feet supported Sitting balance-Leahy Scale: Good     Standing balance support: No upper extremity supported, During functional activity Standing balance-Leahy Scale: Good                               Pertinent Vitals/Pain Pain Assessment Pain Assessment: No/denies pain    Home Living Family/patient expects to be discharged to:: Private residence Living Arrangements: Spouse/significant other Available Help at Discharge: Family Type of Home: House Home Access: Stairs to enter Entrance Stairs-Rails: None Secretary/administrator of Steps: 1+1   Home Layout: One level Home Equipment: None      Prior Function Prior Level of Function : Independent/Modified Independent             Mobility Comments: pt indpendent, very active with house/yard work, Sales executive 2x/week ADLs Comments: independent     Hand Dominance   Dominant Hand: Right    Extremity/Trunk Assessment   Upper Extremity Assessment Upper Extremity Assessment: Overall WFL for tasks assessed    Lower Extremity Assessment Lower Extremity Assessment: Generalized weakness  Cervical / Trunk Assessment Cervical / Trunk Assessment: Normal  Communication   Communication: No difficulties  Cognition Arousal/Alertness: Awake/alert Behavior During Therapy: WFL for tasks assessed/performed Overall Cognitive Status: Within Functional Limits for tasks assessed                                           General Comments General comments (skin integrity, edema, etc.): VSS on RA, pt is incontinent of stool prior to PT arrival, able to perform hygiene tasks in bathroom with setup    Exercises     Assessment/Plan    PT Assessment Patient needs continued PT services  PT Problem List Decreased strength;Decreased activity tolerance;Decreased balance;Decreased mobility       PT Treatment Interventions DME instruction;Gait training;Stair training;Functional mobility training;Balance training;Neuromuscular re-education;Patient/family education;Therapeutic activities    PT Goals (Current goals can be found in the Care Plan section)  Acute Rehab PT Goals Patient Stated Goal: to return to independence, regain strength PT Goal Formulation: With patient Time For Goal Achievement: 02/19/23 Potential to Achieve Goals: Fair Additional Goals Additional Goal #1: Pt will score >19/24 on the DGI to indicate a reduced risk for falls    Frequency Min 1X/week     Co-evaluation               AM-PAC PT "6 Clicks" Mobility  Outcome Measure Help needed turning from your back to your side while in a flat bed without using bedrails?: None Help needed moving from lying on your back to sitting on the side of a flat bed without using bedrails?: None Help needed moving to and from a bed to a chair (including a wheelchair)?: A Little Help needed standing up from a chair using your arms (e.g., wheelchair or bedside chair)?: A Little Help needed to walk in hospital room?: A Little Help needed climbing 3-5 steps with a railing? : A Little 6 Click Score: 20    End of Session   Activity Tolerance: Patient tolerated treatment well Patient left: in chair;with call bell/phone within reach Nurse Communication: Mobility status PT Visit Diagnosis: Other abnormalities of gait and mobility (R26.89);Muscle weakness (generalized) (M62.81)    Time: 2956-2130 PT Time Calculation (min)  (ACUTE ONLY): 42 min   Charges:   PT Evaluation $PT Eval Low Complexity: 1 Low PT Treatments $Therapeutic Activity: 8-22 mins PT General Charges $$ ACUTE PT VISIT: 1 Visit         Arlyss Gandy, PT, DPT Acute Rehabilitation Office [phone]   Arlyss Gandy 02/05/2023, 1:10 PM

## 2023-02-05 NOTE — Plan of Care (Signed)

## 2023-02-05 NOTE — Evaluation (Signed)
Occupational Therapy Evaluation Patient Details Name: Jon Bray MRN: [id number] DOB: 15-Apr-1941 Today's Date: 02/05/2023   History of Present Illness 82 y.o. male presents to [facility] hospital on 01/28/2023 with abdominal pain, nausea and poor PO intake, found to have AKI. CT concerning for acute pancreatitis. EGD on 7/29 showing benign esophageal stenosis hiatal hernia gastric fundus with ulcerated mucosa congested. PMH significant for CAD, hypertension, afib, HLD, chronic renal insufficiency, nephrolithiasis, cholelithiasis, gallstone pancreatitis with retained biliary stone.   Clinical Impression   PTA, pt very independent living with his wife; loves to play golf and work in his yard. Pt presenting slightly off from baseline with decreased endurance, balance, and with flare up of long term back pain. Pt performing ADL with up to min guard A. Due to need for increased medical care affecting daily life activities and pt's typical habits/routines, will follow short term to provide education, optimize endurance, and provide HEP to prevent muscular wasting and optimize strength/balance/endurance. Do not suspect need for follow up post-discharge.      Recommendations for follow up therapy are one component of a multi-disciplinary discharge planning process, led by the attending physician.  Recommendations may be updated based on patient status, additional functional criteria and insurance authorization.   Assistance Recommended at Discharge Intermittent Supervision/Assistance  Patient can return home with the following Assistance with cooking/housework    Functional Status Assessment  Patient has had a recent decline in their functional status and demonstrates the ability to make significant improvements in function in a reasonable and predictable amount of time.  Equipment Recommendations  None recommended by OT    Recommendations for Other Services       Precautions / Restrictions  Precautions Precautions: Fall Restrictions Weight Bearing Restrictions: No      Mobility Bed Mobility Overal bed mobility: Modified Independent                  Transfers Overall transfer level: Needs assistance Equipment used: None Transfers: Sit to/from Stand Sit to Stand: Modified independent (Device/Increase time)           General transfer comment: for basic STS      Balance Overall balance assessment: Needs assistance Sitting-balance support: No upper extremity supported, Feet supported Sitting balance-Leahy Scale: Good     Standing balance support: No upper extremity supported, During functional activity Standing balance-Leahy Scale: Good                             ADL either performed or assessed with clinical judgement   ADL Overall ADL's : Needs assistance/impaired Eating/Feeding: Modified independent;Sitting Eating/Feeding Details (indicate cue type and reason): for clear liquid consumption via cup. Otherwise NPO Grooming: Modified independent;Standing   Upper Body Bathing: Modified independent;Sitting   Lower Body Bathing: Modified independent;Sit to/from stand   Upper Body Dressing : Modified independent;Sitting   Lower Body Dressing: Modified independent;Sit to/from stand   Toilet Transfer: Supervision/safety;Ambulation   Toileting- Clothing Manipulation and Hygiene: Modified independent;Sitting/lateral lean   Tub/ Shower Transfer: Tub transfer;Min guard;Ambulation   Functional mobility during ADLs: Supervision/safety       Vision Baseline Vision/History: 0 No visual deficits Ability to See in Adequate Light: 0 Adequate Patient Visual Report: No change from baseline Vision Assessment?: No apparent visual deficits     Perception Perception Perception Tested?: No   Praxis Praxis Praxis tested?: Not tested    Pertinent Vitals/Pain       Hand Dominance Right  Extremity/Trunk Assessment Upper Extremity  Assessment Upper Extremity Assessment: Overall WFL for tasks assessed   Lower Extremity Assessment Lower Extremity Assessment: Generalized weakness   Cervical / Trunk Assessment Cervical / Trunk Assessment: Normal   Communication Communication Communication: No difficulties   Cognition Arousal/Alertness: Awake/alert Behavior During Therapy: WFL for tasks assessed/performed Overall Cognitive Status: Within Functional Limits for tasks assessed                                       General Comments  VSS on RA    Exercises     Shoulder Instructions      Home Living Family/patient expects to be discharged to:: Private residence Living Arrangements: Spouse/significant other Available Help at Discharge: Family Type of Home: House Home Access: Stairs to enter Secretary/administrator of Steps: 1+1 Entrance Stairs-Rails: None Home Layout: One level               Home Equipment: None          Prior Functioning/Environment Prior Level of Function : Independent/Modified Independent             Mobility Comments: pt indpendent, very active with house/yard work, Sales executive 2x/week ADLs Comments: independent        OT Problem List: Impaired balance (sitting and/or standing);Decreased activity tolerance      OT Treatment/Interventions: Self-care/ADL training;Therapeutic exercise;DME and/or AE instruction;Balance training;Patient/family education;Therapeutic activities    OT Goals(Current goals can be found in the care plan section) Acute Rehab OT Goals Patient Stated Goal: get better OT Goal Formulation: With patient Time For Goal Achievement: 02/19/23 Potential to Achieve Goals: Good  OT Frequency: Min 1X/week    Co-evaluation              AM-PAC OT "6 Clicks" Daily Activity     Outcome Measure Help from another person eating meals?: None Help from another person taking care of personal grooming?: None Help from another person toileting,  which includes using toliet, bedpan, or urinal?: A Little Help from another person bathing (including washing, rinsing, drying)?: A Little Help from another person to put on and taking off regular upper body clothing?: None Help from another person to put on and taking off regular lower body clothing?: None 6 Click Score: 22   End of Session Nurse Communication: Mobility status  Activity Tolerance: Patient tolerated treatment well Patient left: in chair;with call bell/phone within reach;with family/visitor present  OT Visit Diagnosis: Unsteadiness on feet (R26.81);Muscle weakness (generalized) (M62.81)                Time: 2536-6440 OT Time Calculation (min): 35 min Charges:  OT General Charges $OT Visit: 1 Visit OT Evaluation $OT Eval Low Complexity: 1 Low OT Treatments $Self Care/Home Management : 8-22 mins  Jon Bray, OTR/L [facility] Acute Rehabilitation Office: [phone]   Myrla Halsted 02/05/2023, 5:05 PM

## 2023-02-05 NOTE — Plan of Care (Signed)

## 2023-02-06 DIAGNOSIS — E440 Moderate protein-calorie malnutrition: Secondary | ICD-10-CM | POA: Diagnosis not present

## 2023-02-06 DIAGNOSIS — I480 Paroxysmal atrial fibrillation: Secondary | ICD-10-CM | POA: Diagnosis not present

## 2023-02-06 DIAGNOSIS — K8591 Acute pancreatitis with uninfected necrosis, unspecified: Secondary | ICD-10-CM | POA: Diagnosis not present

## 2023-02-06 DIAGNOSIS — I2510 Atherosclerotic heart disease of native coronary artery without angina pectoris: Secondary | ICD-10-CM | POA: Diagnosis not present

## 2023-02-06 LAB — GLUCOSE, CAPILLARY
Glucose-Capillary: 130 mg/dL — ABNORMAL HIGH (ref 70–99)
Glucose-Capillary: 168 mg/dL — ABNORMAL HIGH (ref 70–99)
Glucose-Capillary: 172 mg/dL — ABNORMAL HIGH (ref 70–99)
Glucose-Capillary: 174 mg/dL — ABNORMAL HIGH (ref 70–99)
Glucose-Capillary: 192 mg/dL — ABNORMAL HIGH (ref 70–99)
Glucose-Capillary: 244 mg/dL — ABNORMAL HIGH (ref 70–99)

## 2023-02-06 LAB — CBC WITH DIFFERENTIAL/PLATELET
Abs Immature Granulocytes: 0.17 10*3/uL — ABNORMAL HIGH (ref 0.00–0.07)
Basophils Absolute: 0.1 10*3/uL (ref 0.0–0.1)
Basophils Relative: 0 %
Eosinophils Absolute: 0.3 10*3/uL (ref 0.0–0.5)
Eosinophils Relative: 2 %
HCT: 33.5 % — ABNORMAL LOW (ref 39.0–52.0)
Hemoglobin: 11.3 g/dL — ABNORMAL LOW (ref 13.0–17.0)
Immature Granulocytes: 1 %
Lymphocytes Relative: 21 %
Lymphs Abs: 3.2 10*3/uL (ref 0.7–4.0)
MCH: 28.9 pg (ref 26.0–34.0)
MCHC: 33.7 g/dL (ref 30.0–36.0)
MCV: 85.7 fL (ref 80.0–100.0)
Monocytes Absolute: 1 10*3/uL (ref 0.1–1.0)
Monocytes Relative: 6 %
Neutro Abs: 10.6 10*3/uL — ABNORMAL HIGH (ref 1.7–7.7)
Neutrophils Relative %: 70 %
Platelets: 279 10*3/uL (ref 150–400)
RBC: 3.91 MIL/uL — ABNORMAL LOW (ref 4.22–5.81)
RDW: 14.9 % (ref 11.5–15.5)
WBC: 15.3 10*3/uL — ABNORMAL HIGH (ref 4.0–10.5)
nRBC: 0 % (ref 0.0–0.2)

## 2023-02-06 LAB — RENAL FUNCTION PANEL
Albumin: 2.3 g/dL — ABNORMAL LOW (ref 3.5–5.0)
Anion gap: 10 (ref 5–15)
BUN: 20 mg/dL (ref 8–23)
CO2: 22 mmol/L (ref 22–32)
Calcium: 8.1 mg/dL — ABNORMAL LOW (ref 8.9–10.3)
Chloride: 99 mmol/L (ref 98–111)
Creatinine, Ser: 0.8 mg/dL (ref 0.61–1.24)
GFR, Estimated: >60 mL/min (ref >60)
Glucose, Bld: 216 mg/dL — ABNORMAL HIGH (ref 70–99)
Phosphorus: 3.1 mg/dL (ref 2.5–4.6)
Potassium: 4.2 mmol/L (ref 3.5–5.1)
Sodium: 131 mmol/L — ABNORMAL LOW (ref 135–145)

## 2023-02-06 LAB — MAGNESIUM: Magnesium: 2 mg/dL (ref 1.7–2.4)

## 2023-02-06 MED ORDER — ASPIRIN 81 MG PO CHEW
81.0000 mg | CHEWABLE_TABLET | Freq: Every day | ORAL | Status: DC
  Administered 2023-02-06 – 2023-02-08 (×3): 81 mg via ORAL
  Filled 2023-02-06 (×2): qty 1

## 2023-02-06 MED ORDER — DOCUSATE SODIUM 50 MG/5ML PO LIQD: 100.0000 mg | Freq: Every day | ORAL | Status: DC

## 2023-02-06 MED ORDER — FAMOTIDINE 20 MG PO TABS: 40.0000 mg | ORAL_TABLET | Freq: Every day | ORAL | Status: DC

## 2023-02-06 MED ORDER — ADULT MULTIVITAMIN LIQUID CH
15.0000 mL | Freq: Every day | ORAL | Status: DC
  Filled 2023-02-06: qty 15

## 2023-02-06 MED ORDER — ACETAMINOPHEN 325 MG PO TABS: 650.0000 mg | ORAL_TABLET | Freq: Four times a day (QID) | ORAL | Status: DC | PRN

## 2023-02-06 MED ORDER — FAMOTIDINE 20 MG PO TABS
40.0000 mg | ORAL_TABLET | Freq: Every day | ORAL | Status: DC
  Administered 2023-02-06 – 2023-02-07 (×2): 40 mg via ORAL
  Filled 2023-02-06 (×2): qty 2

## 2023-02-06 MED ORDER — INSULIN ASPART 100 UNIT/ML IJ SOLN
0.0000 [IU] | INTRAMUSCULAR | Status: DC
  Administered 2023-02-06: 2 [IU] via SUBCUTANEOUS
  Administered 2023-02-06 – 2023-02-07 (×4): 3 [IU] via SUBCUTANEOUS

## 2023-02-06 MED ORDER — ADULT MULTIVITAMIN LIQUID CH
15.0000 mL | Freq: Every day | ORAL | Status: DC
  Administered 2023-02-06: 15 mL via ORAL
  Filled 2023-02-06 (×2): qty 15

## 2023-02-06 MED ORDER — ONDANSETRON HCL 4 MG/2ML IJ SOLN: 4.0000 mg | Freq: Four times a day (QID) | INTRAMUSCULAR | Status: DC | PRN

## 2023-02-06 MED ORDER — ASPIRIN 81 MG PO CHEW: 81.0000 mg | CHEWABLE_TABLET | Freq: Every day | ORAL | Status: DC

## 2023-02-06 MED ORDER — ACETAMINOPHEN 650 MG RE SUPP: 650.0000 mg | Freq: Four times a day (QID) | RECTAL | Status: DC | PRN

## 2023-02-06 MED ORDER — POLYETHYLENE GLYCOL 3350 17 G PO PACK: 17.0000 g | PACK | Freq: Every day | ORAL | Status: DC

## 2023-02-06 MED ORDER — TRAMADOL HCL 50 MG PO TABS: 50.0000 mg | ORAL_TABLET | Freq: Two times a day (BID) | ORAL | Status: DC | PRN

## 2023-02-06 NOTE — Plan of Care (Signed)
  Problem: Education: Goal: Ability to describe self-care measures that may prevent or decrease complications (Diabetes Survival Skills Education) will improve Outcome: Progressing Goal: Individualized Educational Video(s) Outcome: Progressing   Problem: Coping: Goal: Ability to adjust to condition or change in health will improve Outcome: Progressing   Problem: Fluid Volume: Goal: Ability to maintain a balanced intake and output will improve Outcome: Progressing   Problem: Health Behavior/Discharge Planning: Goal: Ability to identify and utilize available resources and services will improve Outcome: Progressing Goal: Ability to manage health-related needs will improve Outcome: Progressing   Problem: Metabolic: Goal: Ability to maintain appropriate glucose levels will improve Outcome: Progressing   Problem: Nutritional: Goal: Maintenance of adequate nutrition will improve Outcome: Progressing Goal: Progress toward achieving an optimal weight will improve Outcome: Progressing   Problem: Skin Integrity: Goal: Risk for impaired skin integrity will decrease Outcome: Progressing   Problem: Education: Goal: Knowledge of General Education information will improve Description: Including pain rating scale, medication(s)/side effects and non-pharmacologic comfort measures Outcome: Progressing   Problem: Tissue Perfusion: Goal: Adequacy of tissue perfusion will improve Outcome: Progressing   Problem: Health Behavior/Discharge Planning: Goal: Ability to manage health-related needs will improve Outcome: Progressing   Problem: Clinical Measurements: Goal: Ability to maintain clinical measurements within normal limits will improve Outcome: Progressing Goal: Will remain free from infection Outcome: Progressing Goal: Diagnostic test results will improve Outcome: Progressing Goal: Respiratory complications will improve Outcome: Progressing Goal: Cardiovascular complication will  be avoided Outcome: Progressing

## 2023-02-06 NOTE — Progress Notes (Signed)
Occupational Therapy Treatment Patient Details Name: Jon Bray MRN: [id number] DOB: May 22, 1941 Today's Date: 02/06/2023   History of present illness 82 y.o. male presents to [facility] on 01/28/2023 with abdominal pain, nausea and poor PO intake, found to have AKI. CT concerning for acute pancreatitis. EGD on 7/29 showing benign esophageal stenosis hiatal hernia gastric fundus with ulcerated mucosa congested. PMH significant for CAD, hypertension, afib, HLD, chronic renal insufficiency, nephrolithiasis, cholelithiasis, gallstone pancreatitis with retained biliary stone.   OT comments  Patient received seated up in recliner with wife present. Patient performed mobility in hallway without an assistive device and supervision. Patient was provided with printed HEP and instructed in UE exercises with green therapy band and 2 pound weights. Patient demonstrated good understanding of HEP. Acute OT to continue to follow.    Recommendations for follow up therapy are one component of a multi-disciplinary discharge planning process, led by the attending physician.  Recommendations may be updated based on patient status, additional functional criteria and insurance authorization.    Assistance Recommended at Discharge Intermittent Supervision/Assistance  Patient can return home with the following  Assistance with cooking/housework   Equipment Recommendations  None recommended by OT    Recommendations for Other Services      Precautions / Restrictions Precautions Precautions: Fall Restrictions Weight Bearing Restrictions: No       Mobility Bed Mobility Overal bed mobility: Modified Independent             General bed mobility comments: OOB in recliner    Transfers Overall transfer level: Needs assistance Equipment used: None Transfers: Sit to/from Stand Sit to Stand: Modified independent (Device/Increase time)           General transfer comment: performed mobility in hallway  with supervision and no assistive device     Balance Overall balance assessment: Needs assistance Sitting-balance support: No upper extremity supported, Feet supported Sitting balance-Leahy Scale: Good     Standing balance support: No upper extremity supported, During functional activity Standing balance-Leahy Scale: Good                             ADL either performed or assessed with clinical judgement   ADL Overall ADL's : Needs assistance/impaired                                       General ADL Comments: focused on mobility and UE HEP    Extremity/Trunk Assessment              Vision       Perception     Praxis      Cognition Arousal/Alertness: Awake/alert Behavior During Therapy: WFL for tasks assessed/performed Overall Cognitive Status: Within Functional Limits for tasks assessed                                          Exercises Exercises: General Upper Extremity General Exercises - Upper Extremity Shoulder Flexion: Strengthening, Both, 10 reps, Seated, Theraband Theraband Level (Shoulder Flexion): Level 3 (Green) Shoulder Horizontal ABduction: Strengthening, Both, 15 reps, Seated, Theraband Theraband Level (Shoulder Horizontal Abduction): Level 3 (Green) Elbow Flexion: Strengthening, Both, 15 reps, Seated, Bar weights/barbell Bar Weights/Barbell (Elbow Flexion): 2 lbs Elbow Extension: Strengthening, Both, 15 reps, Seated, Theraband Theraband  Level (Elbow Extension): Level 3 (Green)    Shoulder Instructions       General Comments VSS on RA    Pertinent Vitals/ Pain          Home Living                                          Prior Functioning/Environment              Frequency  Min 1X/week        Progress Toward Goals  OT Goals(current goals can now be found in the care plan section)  Progress towards OT goals: Progressing toward goals  Acute Rehab OT  Goals Patient Stated Goal: go home OT Goal Formulation: With patient Time For Goal Achievement: 02/19/23 Potential to Achieve Goals: Good ADL Goals Pt Will Perform Tub/Shower Transfer: Tub transfer;with supervision;ambulating Pt/caregiver will Perform Home Exercise Program: Increased strength;Both right and left upper extremity;With written HEP provided Additional ADL Goal #1: Pt will optimize activity tolerance by performing 15+ mins OOB activity with mod I Additional ADL Goal #2: Pt will identify and implement 2+ energy conservation strtategies.  Plan Discharge plan remains appropriate    Co-evaluation                 AM-PAC OT "6 Clicks" Daily Activity     Outcome Measure   Help from another person eating meals?: None Help from another person taking care of personal grooming?: None Help from another person toileting, which includes using toliet, bedpan, or urinal?: A Little Help from another person bathing (including washing, rinsing, drying)?: A Little Help from another person to put on and taking off regular upper body clothing?: None Help from another person to put on and taking off regular lower body clothing?: None 6 Click Score: 22    End of Session Equipment Utilized During Treatment: Gait belt  OT Visit Diagnosis: Unsteadiness on feet (R26.81);Muscle weakness (generalized) (M62.81)   Activity Tolerance Patient tolerated treatment well   Patient Left in chair;with call bell/phone within reach;with family/visitor present   Nurse Communication Mobility status        Time: 4098-1191 OT Time Calculation (min): 20 min  Charges: OT General Charges $OT Visit: 1 Visit OT Treatments $Therapeutic Exercise: 8-22 mins  Alfonse Flavors, OTA Acute Rehabilitation Services  Office [phone]   Dewain Penning 02/06/2023, 1:11 PM

## 2023-02-06 NOTE — Progress Notes (Signed)
PROGRESS NOTE  [name] ZOX:[id number] DOB: Nov 16, 1940   PCP: Danella Penton, MD  Patient is from: Home  DOA: 01/28/2023 LOS: 9  Chief complaints No chief complaint on file.    Brief Narrative / Interim history: 82 y.o.m w/ CAD,PAF-not anticoagulated, and admission in May 2024 for acute gallstone pancreatitis s/p cholecystectomy on 12/03/2022 presented 01/28/23 with abdominal pain, nausea, inability to take adequate PO found to have AKI, dehydration. CT is concerning for severe acute pancreatitis with increase in number and size of fluid collections.  No biliary ductal dilatation noted on CT. GI was consulted. Underwent EGD 7/29 showing benign esophageal stenosis hiatal hernia gastric fundus with ulcerated mucosa congested.  Started on tube feed via cortrack on 7/31.  Also on clear liquid diet.  Clinically improving.  He will be discharged with tube feed until follow-up with GI outpatient.   Subjective: Seen and examined earlier this morning.  Feels better today.  No complaints other than mild epigastric abdominal pain but unchanged.  Denies nausea or vomiting.  Denies UTI symptoms.  Denies chest pain, shortness of breath or cough.  Reports 3 loose bowel movements overnight.  Denies blood in the stool.  Objective: Vitals:   02/05/23 1653 02/05/23 2018 02/06/23 0533 02/06/23 0715  BP: 112/64 111/75 108/70 118/80  Pulse: 89 87 94 87  Resp: 16 18 18 16   Temp: 98 F (36.7 C) 97.6 F (36.4 C) 97.6 F (36.4 C) 98 F (36.7 C)  TempSrc: Oral Oral Oral Oral  SpO2: 100% 99% 97% 96%  Weight:      Height:        Examination:  GENERAL: No apparent distress.  Nontoxic. HEENT: MMM.  Vision and hearing grossly intact.  NG tube in place. NECK: Supple.  No apparent JVD.  RESP:  No IWOB.  Fair aeration bilaterally. CVS:  RRR. Heart sounds normal.  ABD/GI/GU: BS+. Abd soft.  No significant tenderness. MSK/EXT:  Moves extremities. No apparent deformity. No edema.  SKIN: no apparent skin  lesion or wound NEURO: Awake and alert.  Oriented appropriately.  No apparent focal neuro deficit. PSYCH: Calm. Normal affect.   Procedures:  Underwent EGD 7/29 showing benign esophageal stenosis hiatal hernia gastric fundus with ulcerated mucosa congested.  Microbiology summarized: None  Assessment and plan: Principal Problem:   Acute pancreatitis Active Problems:   CAD (coronary artery disease)   PAF (paroxysmal atrial fibrillation) (HCC)   Nausea and vomiting   Abnormal CT scan   Malnutrition of moderate degree  Acute gallstone pancreatitis: History of acute gallbladder pancreatitis status postcholecystectomy in 11/2022.  CT abdomen and pelvis on 7/26 with severe acute pancreatitis in the head of the pancreas with interval increase in number and size of multiple loculated fluid collections.  LFT within normal.  Lipase improved from 1258-65.  No pancreatic necrosis.  Clinically improving. -On TF via cortrack as of 7/31.  Will be discharged on tube feed with cortrack until follow-up with GI on 8/20.   -TOC working on home health and tube feed. -Continue clear liquid diet on discharge. -Continue PPI twice daily. -Monitor for refeeding syndrome  Elevated D-dimer: Likely related to pancreatitis.  CT angio negative for PE.  Lower extremity venous Doppler negative for DVT.  -Lovenox for VTE prophylaxis.  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 Recent Labs  Lab 02/05/23 2021 02/06/23 0018 02/06/23 0421 02/06/23 0714 02/06/23 1221  GLUCAP 142* 192* 244* 168* 174*  -Increase SSI to moderate  CAD:No anginal symptoms. -Continue ASA 81mg .  PAF:Not anticoagulated or rate-control agents pta    AKI: Resolved.  Gastritis/esophagitis -Continue PPI twice daily.  Normocytic anemia: Stable -Monitor intermittently  Hypokalemia: Resolved   Fatigue/physical deconditioning: Improved. -OOB/PT/OT. -Ambulate in the hall 3 times a day  Mild leukocytosis: Slightly worsened today but  feels much better.  No clinical signs of infection. -May scan abdomen if continues to rise.  Moderate malnutrition/unintentional weight loss Body mass index is 23.26 kg/m. Nutrition Problem: Moderate Malnutrition Etiology: chronic illness (recurrennt pancreatitis) Signs/Symptoms: moderate fat depletion, severe muscle depletion Interventions: Refer to RD note for recommendations   DVT prophylaxis:  enoxaparin (LOVENOX) injection 40 mg Start: 01/28/23 2115  Code Status: Full code Family Communication: Updated patient's wife at the bedside Level of care: Med-Surg Status is: Inpatient Remains inpatient appropriate because: Acute pancreatitis   Final disposition: Home after 8/5 once home tube feed sorted out. Consultants:  Gastroenterology  35 minutes with more than 50% spent in reviewing records, counseling patient/family and coordinating care.   Sch Meds:  Scheduled Meds:  aspirin  81 mg Oral Daily   enoxaparin (LOVENOX) injection  40 mg Subcutaneous Q24H   famotidine  40 mg Oral QHS   feeding supplement (PROSource TF20)  60 mL Per Tube Daily   free water  150 mL Per Tube Q4H   insulin aspart  0-9 Units Subcutaneous Q4H   multivitamin  15 mL Oral Daily   pantoprazole (PROTONIX) IV  40 mg Intravenous Q12H   Continuous Infusions:  feeding supplement (OSMOLITE 1.5 CAL) 1,000 mL (02/06/23 1528)   PRN Meds:.acetaminophen **OR** acetaminophen, HYDROmorphone (DILAUDID) injection, [DISCONTINUED] ondansetron **OR** ondansetron (ZOFRAN) IV, traMADol  Antimicrobials: Anti-infectives (From admission, onward)    None        I have personally reviewed the following labs and images: CBC: Recent Labs  Lab 02/02/23 0214 02/03/23 0528 02/04/23 0920 02/05/23 0116 02/06/23 0129  WBC 12.7* 10.0 10.2 13.7* 15.3*  NEUTROABS  --   --   --   --  10.6*  HGB 11.1* 11.4* 10.9* 11.7* 11.3*  HCT 33.2* 34.2* 33.7* 36.1* 33.5*  MCV 83.6 83.0 85.3 84.1 85.7  PLT 232 250 253 303 279    BMP &GFR Recent Labs  Lab 02/02/23 0214 02/02/23 1656 02/02/23 1656 02/03/23 0528 02/03/23 1735 02/04/23 0920 02/05/23 0116 02/06/23 0129  NA 132*  --   --  133*  --  137 133* 131*  K 3.6  --   --  3.7  --  4.9 4.0 4.2  CL 101  --   --  102  --  101 102 99  CO2 20*  --   --  22  --  23 22 22   GLUCOSE 157*  --   --  159*  --  204* 174* 216*  BUN 8  --   --  11  --  11 15 20   CREATININE 0.68  --   --  0.67  --  0.83 0.80 0.80  CALCIUM 8.2*  --   --  8.4*  --  8.6* 8.4* 8.1*  MG  --  1.8   < > 1.8 1.9 1.9 2.0 2.0  PHOS  --  2.9  --  3.7 3.7  --  3.5 3.1   < > = values in this interval not displayed.   Estimated Creatinine Clearance: 67.7 mL/min (by C-G formula based on SCr of 0.8 mg/dL). Liver & Pancreas: Recent Labs  Lab 01/31/23 0354 02/02/23 0214 02/03/23 8469 02/04/23 0920 02/05/23 0116 02/06/23  0129  AST 25 21 18 20 23   --   ALT 29 28 25 24 30   --   ALKPHOS 76 70 66 65 73  --   BILITOT 1.3* 0.9 0.8 0.5 0.7  --   PROT 6.6 6.0* 6.5 5.9* 6.5  --   ALBUMIN 2.5* 2.2* 2.3* 2.3* 2.4* 2.3*   No results for input(s): "LIPASE", "AMYLASE" in the last 168 hours.  No results for input(s): "AMMONIA" in the last 168 hours. Diabetic: No results for input(s): "HGBA1C" in the last 72 hours. Recent Labs  Lab 02/05/23 2021 02/06/23 0018 02/06/23 0421 02/06/23 0714 02/06/23 1221  GLUCAP 142* 192* 244* 168* 174*   Cardiac Enzymes: No results for input(s): "CKTOTAL", "CKMB", "CKMBINDEX", "TROPONINI" in the last 168 hours. No results for input(s): "PROBNP" in the last 8760 hours. Coagulation Profile: No results for input(s): "INR", "PROTIME" in the last 168 hours. Thyroid Function Tests: No results for input(s): "TSH", "T4TOTAL", "FREET4", "T3FREE", "THYROIDAB" in the last 72 hours. Lipid Profile: No results for input(s): "CHOL", "HDL", "LDLCALC", "TRIG", "CHOLHDL", "LDLDIRECT" in the last 72 hours. Anemia Panel: No results for input(s): "VITAMINB12", "FOLATE",  "FERRITIN", "TIBC", "IRON", "RETICCTPCT" in the last 72 hours. Urine analysis:    Component Value Date/Time   COLORURINE AMBER (A) 01/28/2023 1312   APPEARANCEUR HAZY (A) 01/28/2023 1312   APPEARANCEUR Clear 08/28/2021 1113   LABSPEC 1.031 (H) 01/28/2023 1312   PHURINE 5.0 01/28/2023 1312   GLUCOSEU NEGATIVE 01/28/2023 1312   HGBUR NEGATIVE 01/28/2023 1312   BILIRUBINUR NEGATIVE 01/28/2023 1312   BILIRUBINUR Negative 08/28/2021 1113   KETONESUR NEGATIVE 01/28/2023 1312   PROTEINUR 100 (A) 01/28/2023 1312   NITRITE NEGATIVE 01/28/2023 1312   LEUKOCYTESUR NEGATIVE 01/28/2023 1312   Sepsis Labs: Invalid input(s): "PROCALCITONIN", "LACTICIDVEN"  Microbiology: No results found for this or any previous visit (from the past 240 hour(s)).  Radiology Studies: No results found.     T.  [facility]  If 7PM-7AM, please contact night-coverage [email or web address] 02/06/2023, 3:32 PM

## 2023-02-06 NOTE — Plan of Care (Signed)

## 2023-02-07 ENCOUNTER — Telehealth: Payer: Self-pay

## 2023-02-07 ENCOUNTER — Other Ambulatory Visit: Payer: Self-pay

## 2023-02-07 DIAGNOSIS — I480 Paroxysmal atrial fibrillation: Secondary | ICD-10-CM | POA: Diagnosis not present

## 2023-02-07 DIAGNOSIS — E440 Moderate protein-calorie malnutrition: Secondary | ICD-10-CM | POA: Diagnosis not present

## 2023-02-07 DIAGNOSIS — K8591 Acute pancreatitis with uninfected necrosis, unspecified: Secondary | ICD-10-CM | POA: Diagnosis not present

## 2023-02-07 DIAGNOSIS — I2510 Atherosclerotic heart disease of native coronary artery without angina pectoris: Secondary | ICD-10-CM | POA: Diagnosis not present

## 2023-02-07 LAB — CBC WITH DIFFERENTIAL/PLATELET
Abs Immature Granulocytes: 0.15 10*3/uL — ABNORMAL HIGH (ref 0.00–0.07)
Basophils Absolute: 0.1 10*3/uL (ref 0.0–0.1)
Basophils Relative: 0 %
Eosinophils Absolute: 0.5 10*3/uL (ref 0.0–0.5)
Eosinophils Relative: 3 %
HCT: 33.6 % — ABNORMAL LOW (ref 39.0–52.0)
Hemoglobin: 11 g/dL — ABNORMAL LOW (ref 13.0–17.0)
Immature Granulocytes: 1 %
Lymphocytes Relative: 19 %
Lymphs Abs: 2.8 10*3/uL (ref 0.7–4.0)
MCH: 27.6 pg (ref 26.0–34.0)
MCHC: 32.7 g/dL (ref 30.0–36.0)
MCV: 84.4 fL (ref 80.0–100.0)
Monocytes Absolute: 1.1 10*3/uL — ABNORMAL HIGH (ref 0.1–1.0)
Monocytes Relative: 7 %
Neutro Abs: 10.4 10*3/uL — ABNORMAL HIGH (ref 1.7–7.7)
Neutrophils Relative %: 70 %
Platelets: 280 10*3/uL (ref 150–400)
RBC: 3.98 MIL/uL — ABNORMAL LOW (ref 4.22–5.81)
RDW: 15 % (ref 11.5–15.5)
WBC: 14.9 10*3/uL — ABNORMAL HIGH (ref 4.0–10.5)
nRBC: 0 % (ref 0.0–0.2)

## 2023-02-07 LAB — COMPREHENSIVE METABOLIC PANEL
ALT: 26 U/L (ref 0–44)
AST: 31 U/L (ref 15–41)
Albumin: 2.3 g/dL — ABNORMAL LOW (ref 3.5–5.0)
Alkaline Phosphatase: 68 U/L (ref 38–126)
Anion gap: 13 (ref 5–15)
BUN: 20 mg/dL (ref 8–23)
CO2: 19 mmol/L — ABNORMAL LOW (ref 22–32)
Calcium: 8.1 mg/dL — ABNORMAL LOW (ref 8.9–10.3)
Chloride: 98 mmol/L (ref 98–111)
Creatinine, Ser: 0.78 mg/dL (ref 0.61–1.24)
GFR, Estimated: >60 mL/min (ref >60)
Glucose, Bld: 195 mg/dL — ABNORMAL HIGH (ref 70–99)
Potassium: 4.6 mmol/L (ref 3.5–5.1)
Sodium: 130 mmol/L — ABNORMAL LOW (ref 135–145)
Total Bilirubin: 0.8 mg/dL (ref 0.3–1.2)
Total Protein: 6.5 g/dL (ref 6.5–8.1)

## 2023-02-07 LAB — GLUCOSE, CAPILLARY
Glucose-Capillary: 168 mg/dL — ABNORMAL HIGH (ref 70–99)
Glucose-Capillary: 169 mg/dL — ABNORMAL HIGH (ref 70–99)
Glucose-Capillary: 175 mg/dL — ABNORMAL HIGH (ref 70–99)
Glucose-Capillary: 179 mg/dL — ABNORMAL HIGH (ref 70–99)

## 2023-02-07 LAB — PHOSPHORUS: Phosphorus: 4 mg/dL (ref 2.5–4.6)

## 2023-02-07 LAB — MAGNESIUM: Magnesium: 2 mg/dL (ref 1.7–2.4)

## 2023-02-07 MED ORDER — TAB-A-VITE/IRON PO TABS
1.0000 | ORAL_TABLET | Freq: Every day | ORAL | Status: DC
  Administered 2023-02-07 – 2023-02-08 (×2): 1 via ORAL
  Filled 2023-02-07 (×2): qty 1

## 2023-02-07 MED ORDER — INSULIN STARTER KIT- PEN NEEDLES (ENGLISH)
1.0000 | Freq: Once | Status: AC
  Administered 2023-02-07: 1
  Filled 2023-02-07: qty 1

## 2023-02-07 MED ORDER — FREE WATER
100.0000 mL | Status: DC
  Administered 2023-02-07 – 2023-02-08 (×7): 100 mL

## 2023-02-07 MED ORDER — INSULIN GLARGINE-YFGN 100 UNIT/ML ~~LOC~~ SOLN
10.0000 [IU] | Freq: Every day | SUBCUTANEOUS | Status: DC
  Administered 2023-02-07 – 2023-02-08 (×2): 10 [IU] via SUBCUTANEOUS
  Filled 2023-02-07 (×2): qty 0.1

## 2023-02-07 MED ORDER — OSMOLITE 1.5 CAL PO LIQD
1440.0000 mL | ORAL | Status: DC
  Administered 2023-02-07: 1440 mL
  Filled 2023-02-07 (×2): qty 1659

## 2023-02-07 NOTE — Telephone Encounter (Signed)
Yes 2-3 days before my clinic visit and put an Urgent on the read. Thanks. GM

## 2023-02-07 NOTE — Progress Notes (Signed)
PT Cancellation Note  Patient Details Name: Jon Bray MRN: [id number] DOB: 12-31-1940   Cancelled Treatment:    Reason Eval/Treat Not Completed: (P) Fatigue/lethargy limiting ability to participate (pt c/o fatigue and reports he did ambulate in hallway x2 today with assist.) Pt assisted to reposition in supine to maintain HOB >35* and heels floated for pressure relief/comfort. Will continue efforts next date per PT plan of care as schedule permits.   Dorathy Kinsman  02/07/2023, 5:34 PM

## 2023-02-07 NOTE — Progress Notes (Addendum)
PROGRESS NOTE  Jon Bray ZOX:[id number] DOB: 10/16/1940   PCP: Danella Penton, MD  Patient is from: Home  DOA: 01/28/2023 LOS: 10  Chief complaints No chief complaint on file.    Brief Narrative / Interim history: 82 y.o.m w/ CAD,PAF-not anticoagulated, and admission in May 2024 for acute gallstone pancreatitis s/p cholecystectomy on 12/03/2022 presented 01/28/23 with abdominal pain, nausea, inability to take adequate PO found to have AKI, dehydration. CT is concerning for severe acute pancreatitis with increase in number and size of fluid collections.  No biliary ductal dilatation noted on CT. GI was consulted. Underwent EGD 7/29 showing benign esophageal stenosis hiatal hernia gastric fundus with ulcerated mucosa congested.  Started on tube feed via cortrack on 7/31.  Also on clear liquid diet.  Clinically improving.  He will be discharged with tube feed until follow-up with GI outpatient.  TOC and GI working on home tube feed.   Subjective: Seen and examined earlier this morning.  No major events overnight of this morning.  Feels well.  Diarrhea has slowed down.  Denies nausea.  Tolerating tube feed.  [name] at bedside.  Objective: Vitals:   02/07/23 0439 02/07/23 0500 02/07/23 0849 02/07/23 1532  BP: (!) 107/58  104/71 120/68  Pulse: 86  83 80  Resp: 18  16 16   Temp: 98.1 F (36.7 C)  97.7 F (36.5 C) 98.4 F (36.9 C)  TempSrc: Oral  Oral Oral  SpO2: 97%  97% 97%  Weight:  68.1 kg    Height:        Examination:  GENERAL: No apparent distress.  Nontoxic. HEENT: MMM.  Vision and hearing grossly intact.  NG tube in place. NECK: Supple.  No apparent JVD.  RESP:  No IWOB.  Fair aeration bilaterally. CVS:  RRR. Heart sounds normal.  ABD/GI/GU: BS+. Abd soft.  No significant tenderness. MSK/EXT:  Moves extremities. No apparent deformity. No edema.  SKIN: no apparent skin lesion or wound NEURO: Awake and alert.  Oriented appropriately.  No apparent focal neuro  deficit. PSYCH: Calm. Normal affect.   Procedures:  Underwent EGD 7/29 showing benign esophageal stenosis hiatal hernia gastric fundus with ulcerated mucosa congested.  Microbiology summarized: None  Assessment and plan: Principal Problem:   Acute pancreatitis Active Problems:   CAD (coronary artery disease)   PAF (paroxysmal atrial fibrillation) (HCC)   Nausea and vomiting   Abnormal CT scan   Malnutrition of moderate degree  Acute gallstone pancreatitis: History of acute gallbladder pancreatitis status postcholecystectomy in 11/2022.  CT abdomen and pelvis on 7/26 with severe acute pancreatitis in the head of the pancreas with interval increase in number and size of multiple loculated fluid collections.  LFT within normal.  Lipase improved from 1258-65.  No pancreatic necrosis.  Clinically improving. -On TF via cortrack as of 7/31.  Will be discharged on tube feed with cortrack until follow-up with GI on 8/20.   -TOC and GI working on home tube feed. -Continue clear liquid diet on discharge. -Continue PPI twice daily. -Monitor for refeeding syndrome  Elevated D-dimer: Likely related to pancreatitis.  CT angio negative for PE.  Lower extremity venous Doppler negative for DVT.  -Lovenox for VTE prophylaxis.  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 Recent Labs  Lab 02/06/23 1631 02/06/23 1956 02/07/23 0032 02/07/23 0444 02/07/23 0828  GLUCAP 130* 172* 179* 169* 175*  -Increase SSI to moderate  CAD:No anginal symptoms. -Continue ASA 81mg .   PAF:Not anticoagulated or rate-control agents pta  AKI: Resolved.  Gastritis/esophagitis -Continue PPI twice daily.  Normocytic anemia: Stable -Monitor intermittently  Hypokalemia: Resolved   Fatigue/physical deconditioning: Improved. -OOB/PT/OT. -Ambulate in the hall 3 times a day  Mild leukocytosis: Stable.  Feels well. -Recheck in the morning  Hyponatremia: Mild -Continue monitoring  Moderate  malnutrition/unintentional weight loss Body mass index is 22.83 kg/m. Nutrition Problem: Moderate Malnutrition Etiology: chronic illness (recurrennt pancreatitis) Signs/Symptoms: moderate fat depletion, severe muscle depletion Interventions: Refer to RD note for recommendations   DVT prophylaxis:  enoxaparin (LOVENOX) injection 40 mg Start: 01/28/23 2115  Code Status: Full code Family Communication: Updated patient's wife at the bedside Level of care: Med-Surg Status is: Inpatient Remains inpatient appropriate because: Acute pancreatitis   Final disposition: Home after 8/5 once home tube feed sorted out. Consultants:  Gastroenterology  35 minutes with more than 50% spent in reviewing records, counseling patient/family and coordinating care.   Sch Meds:  Scheduled Meds:  aspirin  81 mg Oral Daily   enoxaparin (LOVENOX) injection  40 mg Subcutaneous Q24H   famotidine  40 mg Oral QHS   feeding supplement (OSMOLITE 1.5 CAL)  1,440 mL Per Tube Q24H   feeding supplement (PROSource TF20)  60 mL Per Tube Daily   free water  100 mL Per Tube Q3H   insulin glargine-yfgn  10 Units Subcutaneous Daily   multivitamins with iron  1 tablet Oral Daily   pantoprazole (PROTONIX) IV  40 mg Intravenous Q12H   Continuous Infusions:   PRN Meds:.acetaminophen **OR** acetaminophen, HYDROmorphone (DILAUDID) injection, [DISCONTINUED] ondansetron **OR** ondansetron (ZOFRAN) IV, traMADol  Antimicrobials: Anti-infectives (From admission, onward)    None        I have personally reviewed the following labs and images: CBC: Recent Labs  Lab 02/03/23 0528 02/04/23 0920 02/05/23 0116 02/06/23 0129 02/07/23 0840  WBC 10.0 10.2 13.7* 15.3* 14.9*  NEUTROABS  --   --   --  10.6* 10.4*  HGB 11.4* 10.9* 11.7* 11.3* 11.0*  HCT 34.2* 33.7* 36.1* 33.5* 33.6*  MCV 83.0 85.3 84.1 85.7 84.4  PLT 250 253 303 279 280   BMP &GFR Recent Labs  Lab 02/03/23 0528 02/03/23 1735 02/04/23 0920  02/05/23 0116 02/06/23 0129 02/07/23 0840  NA 133*  --  137 133* 131* 130*  K 3.7  --  4.9 4.0 4.2 4.6  CL 102  --  101 102 99 98  CO2 22  --  23 22 22  19*  GLUCOSE 159*  --  204* 174* 216* 195*  BUN 11  --  11 15 20 20   CREATININE 0.67  --  0.83 0.80 0.80 0.78  CALCIUM 8.4*  --  8.6* 8.4* 8.1* 8.1*  MG 1.8 1.9 1.9 2.0 2.0 2.0  PHOS 3.7 3.7  --  3.5 3.1 4.0   Estimated Creatinine Clearance: 67.4 mL/min (by C-G formula based on SCr of 0.78 mg/dL). Liver & Pancreas: Recent Labs  Lab 02/02/23 0214 02/03/23 0528 02/04/23 0920 02/05/23 0116 02/06/23 0129 02/07/23 0840  AST 21 18 20 23   --  31  ALT 28 25 24 30   --  26  ALKPHOS 70 66 65 73  --  68  BILITOT 0.9 0.8 0.5 0.7  --  0.8  PROT 6.0* 6.5 5.9* 6.5  --  6.5  ALBUMIN 2.2* 2.3* 2.3* 2.4* 2.3* 2.3*   No results for input(s): "LIPASE", "AMYLASE" in the last 168 hours.  No results for input(s): "AMMONIA" in the last 168 hours. Diabetic: No results for input(s): "HGBA1C"  in the last 72 hours. Recent Labs  Lab 02/06/23 1631 02/06/23 1956 02/07/23 0032 02/07/23 0444 02/07/23 0828  GLUCAP 130* 172* 179* 169* 175*   Cardiac Enzymes: No results for input(s): "CKTOTAL", "CKMB", "CKMBINDEX", "TROPONINI" in the last 168 hours. No results for input(s): "PROBNP" in the last 8760 hours. Coagulation Profile: No results for input(s): "INR", "PROTIME" in the last 168 hours. Thyroid Function Tests: No results for input(s): "TSH", "T4TOTAL", "FREET4", "T3FREE", "THYROIDAB" in the last 72 hours. Lipid Profile: No results for input(s): "CHOL", "HDL", "LDLCALC", "TRIG", "CHOLHDL", "LDLDIRECT" in the last 72 hours. Anemia Panel: No results for input(s): "VITAMINB12", "FOLATE", "FERRITIN", "TIBC", "IRON", "RETICCTPCT" in the last 72 hours. Urine analysis:    Component Value Date/Time   COLORURINE AMBER (A) 01/28/2023 1312   APPEARANCEUR HAZY (A) 01/28/2023 1312   APPEARANCEUR Clear 08/28/2021 1113   LABSPEC 1.031 (H) 01/28/2023  1312   PHURINE 5.0 01/28/2023 1312   GLUCOSEU NEGATIVE 01/28/2023 1312   HGBUR NEGATIVE 01/28/2023 1312   BILIRUBINUR NEGATIVE 01/28/2023 1312   BILIRUBINUR Negative 08/28/2021 1113   KETONESUR NEGATIVE 01/28/2023 1312   PROTEINUR 100 (A) 01/28/2023 1312   NITRITE NEGATIVE 01/28/2023 1312   LEUKOCYTESUR NEGATIVE 01/28/2023 1312   Sepsis Labs: Invalid input(s): "PROCALCITONIN", "LACTICIDVEN"  Microbiology: No results found for this or any previous visit (from the past 240 hour(s)).  Radiology Studies: No results found.     T.  [facility]  If 7PM-7AM, please contact night-coverage [email or web address] 02/07/2023, 6:40 PM

## 2023-02-07 NOTE — Telephone Encounter (Signed)
Pt had CT on 7/22 does he need another one now?

## 2023-02-07 NOTE — Plan of Care (Signed)
Problem: Education: Goal: Ability to describe self-care measures that may prevent or decrease complications (Diabetes Survival Skills Education) will improve 02/07/2023 0436 by Clearnce Sorrel, RN Outcome: Progressing 02/07/2023 0436 by Clearnce Sorrel, RN Outcome: Progressing Goal: Individualized Educational Video(s) 02/07/2023 0436 by Clearnce Sorrel, RN Outcome: Progressing 02/07/2023 0436 by Clearnce Sorrel, RN Outcome: Progressing   Problem: Health Behavior/Discharge Planning: Goal: Ability to identify and utilize available resources and services will improve 02/07/2023 0436 by Clearnce Sorrel, RN Outcome: Progressing 02/07/2023 0436 by Clearnce Sorrel, RN Outcome: Progressing Goal: Ability to manage health-related needs will improve 02/07/2023 0436 by Clearnce Sorrel, RN Outcome: Progressing 02/07/2023 0436 by Clearnce Sorrel, RN Outcome: Progressing   Problem: Metabolic: Goal: Ability to maintain appropriate glucose levels will improve 02/07/2023 0436 by Clearnce Sorrel, RN Outcome: Progressing 02/07/2023 0436 by Clearnce Sorrel, RN Outcome: Progressing   Problem: Skin Integrity: Goal: Risk for impaired skin integrity will decrease 02/07/2023 0436 by Clearnce Sorrel, RN Outcome: Progressing 02/07/2023 0436 by Clearnce Sorrel, RN Outcome: Progressing   Problem: Tissue Perfusion: Goal: Adequacy of tissue perfusion will improve 02/07/2023 0436 by Clearnce Sorrel, RN Outcome: Progressing 02/07/2023 0436 by Clearnce Sorrel, RN Outcome: Progressing   Problem: Education: Goal: Knowledge of General Education information will improve Description: Including pain rating scale, medication(s)/side effects and non-pharmacologic comfort measures 02/07/2023 0436 by Clearnce Sorrel, RN Outcome: Progressing 02/07/2023 0436 by Clearnce Sorrel, RN Outcome: Progressing   Problem: Health  Behavior/Discharge Planning: Goal: Ability to manage health-related needs will improve 02/07/2023 0436 by Clearnce Sorrel, RN Outcome: Progressing 02/07/2023 0436 by Clearnce Sorrel, RN Outcome: Progressing   Problem: Clinical Measurements: Goal: Ability to maintain clinical measurements within normal limits will improve 02/07/2023 0436 by Clearnce Sorrel, RN Outcome: Progressing 02/07/2023 0436 by Clearnce Sorrel, RN Outcome: Progressing Goal: Will remain free from infection 02/07/2023 0436 by Clearnce Sorrel, RN Outcome: Progressing 02/07/2023 0436 by Clearnce Sorrel, RN Outcome: Progressing Goal: Diagnostic test results will improve 02/07/2023 0436 by Clearnce Sorrel, RN Outcome: Progressing 02/07/2023 0436 by Clearnce Sorrel, RN Outcome: Progressing Goal: Respiratory complications will improve 02/07/2023 0436 by Clearnce Sorrel, RN Outcome: Progressing 02/07/2023 0436 by Clearnce Sorrel, RN Outcome: Progressing Goal: Cardiovascular complication will be avoided 02/07/2023 0436 by Clearnce Sorrel, RN Outcome: Progressing 02/07/2023 0436 by Clearnce Sorrel, RN Outcome: Progressing   Problem: Activity: Goal: Risk for activity intolerance will decrease 02/07/2023 0436 by Clearnce Sorrel, RN Outcome: Progressing 02/07/2023 0436 by Clearnce Sorrel, RN Outcome: Progressing   Problem: Coping: Goal: Level of anxiety will decrease 02/07/2023 0436 by Clearnce Sorrel, RN Outcome: Progressing 02/07/2023 0436 by Clearnce Sorrel, RN Outcome: Progressing   Problem: Elimination: Goal: Will not experience complications related to bowel motility 02/07/2023 0436 by Clearnce Sorrel, RN Outcome: Progressing 02/07/2023 0436 by Clearnce Sorrel, RN Outcome: Progressing Goal: Will not experience complications related to urinary retention 02/07/2023 0436 by Clearnce Sorrel, RN Outcome:  Progressing 02/07/2023 0436 by Clearnce Sorrel, RN Outcome: Progressing   Problem: Pain Managment: Goal: General experience of comfort will improve 02/07/2023 0436 by Clearnce Sorrel, RN Outcome: Progressing 02/07/2023 0436 by Clearnce Sorrel, RN Outcome:  Progressing   Problem: Safety: Goal: Ability to remain free from injury will improve 02/07/2023 0436 by Clearnce Sorrel, RN Outcome: Progressing 02/07/2023 0436 by Clearnce Sorrel, RN Outcome: Progressing   Problem: Skin Integrity: Goal: Risk for impaired skin integrity will decrease 02/07/2023 0436 by Clearnce Sorrel, RN Outcome: Progressing 02/07/2023 0436 by Clearnce Sorrel, RN Outcome: Progressing   Problem: Coping: Goal: Ability to adjust to condition or change in health will improve 02/07/2023 0436 by Clearnce Sorrel, RN Outcome: Not Progressing 02/07/2023 0436 by Clearnce Sorrel, RN Outcome: Progressing   Problem: Fluid Volume: Goal: Ability to maintain a balanced intake and output will improve 02/07/2023 0436 by Clearnce Sorrel, RN Outcome: Not Progressing 02/07/2023 0436 by Clearnce Sorrel, RN Outcome: Progressing   Problem: Nutritional: Goal: Maintenance of adequate nutrition will improve 02/07/2023 0436 by Clearnce Sorrel, RN Outcome: Not Progressing 02/07/2023 0436 by Clearnce Sorrel, RN Outcome: Progressing Goal: Progress toward achieving an optimal weight will improve 02/07/2023 0436 by Clearnce Sorrel, RN Outcome: Not Progressing 02/07/2023 0436 by Clearnce Sorrel, RN Outcome: Progressing   Problem: Nutrition: Goal: Adequate nutrition will be maintained 02/07/2023 0436 by Clearnce Sorrel, RN Outcome: Not Progressing 02/07/2023 0436 by Clearnce Sorrel, RN Outcome: Progressing

## 2023-02-07 NOTE — Telephone Encounter (Signed)
-----   Message from Linda D. Zehr sent at 02/06/2023  4:30 PM EDT ----- Please arrange for CT scan prior to his visit with Dr. Meridee Score later this month per his message below. ----- Message ----- From: [name]., MD Sent: 02/04/2023   4:55 PM EDT To: Princella Pellegrini Zehr, PA-C  JZ, I am fine with the date. I would plan for him to have a repeat CT scan done a couple days before the clinic visit for follow-up. Please relay to the medicine service that make sure that care management has arranged everything that they need for tube feeds so that there is not issues next week when I am not in the office and they need signatures from that standpoint. Thanks. GM ----- Message ----- From: Leta Baptist, PA-C Sent: 02/04/2023   4:46 PM EDT To: [name]., MD  No, we were not thinking sooner, we were wondering if that was too soon for you to want to see him? ----- Message ----- From: [name]., MD Sent: 02/04/2023   4:42 PM EDT To: Princella Pellegrini Zehr, PA-C  JZ, I do not have anything sooner as I am already double booked every single day in clinic.  If patient needs to be seen sooner, then we will need an APP visit. I will let you all decide. Thanks. GM ----- Message ----- From: Leta Baptist, PA-C Sent: 02/04/2023   2:47 PM EDT To: [name]., MD  Patient is ready for discharge tomorrow.  He is going to remain on post-pyloric tube feeds until he sees you.  His appt is currently on 8/20.  We wanted to make sure you are still ok with that appt date, etc.    Thank you,  Jess

## 2023-02-07 NOTE — Telephone Encounter (Signed)
Order entered for CT and sent to the schedulers to have scheduled the week of 8/12 so that results will be available for the appt with GM

## 2023-02-07 NOTE — TOC Progression Note (Addendum)
Transition of Care [facility]) - Progression Note    Patient Details  Name: Jon Bray MRN: [id number] Date of Birth: Sep 04, 1940  Transition of [facility] CM/SW Contact  Ronny Bacon, RN Phone Number: 02/07/2023, 10:47 AM  Clinical Narrative:   Spoke with Pam-[employer], patient will have to pay for tube feeding out of pocket. Pam will talk with patient and family to see if they can afford the tube feeding costs. Attending made aware.  1400: Spoke with patient, spouse and Pam( [employer]) at bedside. Tube feeding self pay is estimated to be $500/month. Per spouse, provider reports patient would need tube feedings at least through Christmas and that he would be a surgical risk for G-tube or J-Tube placement., so therefore not a surgical candidate. Patient continues to have Cortrack in place with tube feeding infusing. Diabetes educator at bedside, Wife and patient was educated on insulin injection using insulin pen, with return demonstration. Patient reports unable to inject insulin on his own, wife will be the one giving patient insulin injections. Provider made aware of conversation with RNCM, [employer] rep, patient and wife at bedside regarding tube feeding infusion at home.     Expected Discharge Plan: Home w Home Health Services Barriers to Discharge: Continued Medical Work up  Expected Discharge Plan and Services   Discharge Planning Services: CM Consult Post Acute Care Choice: Home Health Living arrangements for the past 2 months: Single Family Home                           HH Arranged: RN [facility] Agency: [facility] Date [facility] Agency Contacted: 02/04/23   Representative spoke with at [facility] Agency: Haywood Lasso   Social Determinants of Health (SDOH) Interventions SDOH Screenings   Food Insecurity: No Food Insecurity (01/28/2023)  Housing: Low Risk  (01/28/2023)  Transportation Needs: No Transportation Needs (01/28/2023)  Utilities: Not At Risk (01/28/2023)  Depression (PHQ2-9): Low Risk   (11/17/2022)  Financial Resource Strain: Low Risk  (11/17/2022)  Physical Activity: Sufficiently Active (11/17/2022)  Social Connections: Socially Integrated (11/17/2022)  Stress: No Stress Concern Present (11/17/2022)  Tobacco Use: Low Risk  (01/31/2023)  Recent Concern: Tobacco Use - Medium Risk (01/25/2023)   Received from [facility]    Readmission Risk Interventions     No data to display

## 2023-02-07 NOTE — Progress Notes (Signed)
Nutrition Follow-up  DOCUMENTATION CODES:   Non-severe (moderate) malnutrition in context of chronic illness  INTERVENTION:  Multivitamin w/ minerals daily Tube feeds via Cortrak: Osmolite 1.5 at 90 mL/hr x 16 hours from 1800 to 1000 (1440 mL per day or 6 cartons per day) 60 mL ProSource TF20 - daily (not needed at discharged) Free water flush: 100 mL before and after connecting from pump + 6 additional 100 mL flushes throughout the day Regimen provides 2213 kcal, 109 gm protein, and 1889 mL total; free water daily.   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 - Ongoing  GOAL:   Patient will meet greater than or equal to 90% of their needs - Being met via TF  MONITOR:   PO intake, Labs, Weight trends, TF tolerance, I & O's  REASON FOR ASSESSMENT:   Consult Assessment of nutrition requirement/status, Enteral/tube feeding initiation and management  ASSESSMENT:   82 y.o. male presented to the ED with abdominal pain and nausea. PT recently admitted in May for acute gallstone pancreatitis s/p cholecystectomy. PMH includes CAD, diverticulosis, HLD, GERD, T2DM, and PAF. Pt admitted with acute pancreatitis.   7/26 - Admitted 7/29 - EGD 7/31 - Cortrak placed; TF initiated  Met with pt and wife in room. Ongoing discharge planning occurring. Plan for pt to discharge home with Cortrak in-place.  Discussed adjusting tube feed regimen to allow time off the pump when at home. Pt agreeable and would like that. Discussed TF regimen change with RN. Pt expressed tolerating tube feeds, no nausea or vomiting. Endorses frequent diarrhea. RD encouraged pt to take all meds as able per mouth to avoid clogging tube once home.   Discussed tube maintenance with pt and wife. Still have to complete training with [employer] Rep.   Medications reviewed and include: Pepcid, Semglee,  Protonix, MVI Labs reviewed: Sodium 130, Potassium 4.6, BUN 20, Creatinine 0.78, Phosphorus 4.0, Magnesium 2.0 CBG: 130-179 x 24 hrs   Diet Order:   Diet Order             Diet clear liquid Room service appropriate? Yes; Fluid consistency: Thin  Diet effective now                   EDUCATION NEEDS:   Education needs have been addressed  Skin:  Skin Assessment: Reviewed RN Assessment  Last BM:  8/5  Height:  Ht Readings from Last 1 Encounters:  01/28/23 5\' 8"  (1.727 m)   Weight:  Wt Readings from Last 1 Encounters:  02/07/23 68.1 kg    Ideal Body Weight:  70 kg  BMI:  Body mass index is 22.83 kg/m.  Estimated Nutritional Needs:  Kcal:  2100-2300 Protein:  105-125 gtrams Fluid:  >/= 2 L   Kirby Crigler RD, LDN Clinical Dietitian See [facility] for contact information.

## 2023-02-07 NOTE — Plan of Care (Signed)
Patient alert/oriented X4. Patient tolerated Osmolite 1.5 tube feedings at 60 mL/hr. Patient was up in chair majority of the shift and properly covered with insulin. Patient VSS, no complaints at this time.   Problem: Education: Goal: Ability to describe self-care measures that may prevent or decrease complications (Diabetes Survival Skills Education) will improve Outcome: Progressing   Problem: Education: Goal: Individualized Educational Video(s) Outcome: Progressing   Problem: Coping: Goal: Ability to adjust to condition or change in health will improve Outcome: Progressing   Problem: Fluid Volume: Goal: Ability to maintain a balanced intake and output will improve Outcome: Progressing   Problem: Health Behavior/Discharge Planning: Goal: Ability to identify and utilize available resources and services will improve Outcome: Progressing   Problem: Health Behavior/Discharge Planning: Goal: Ability to manage health-related needs will improve Outcome: Progressing   Problem: Metabolic: Goal: Ability to maintain appropriate glucose levels will improve Outcome: Progressing   Problem: Nutritional: Goal: Maintenance of adequate nutrition will improve Outcome: Progressing   Problem: Nutritional: Goal: Progress toward achieving an optimal weight will improve Outcome: Progressing   Problem: Skin Integrity: Goal: Risk for impaired skin integrity will decrease Outcome: Progressing   Problem: Tissue Perfusion: Goal: Adequacy of tissue perfusion will improve Outcome: Progressing   Problem: Education: Goal: Knowledge of General Education information will improve Description: Including pain rating scale, medication(s)/side effects and non-pharmacologic comfort measures Outcome: Progressing   Problem: Health Behavior/Discharge Planning: Goal: Ability to manage health-related needs will improve Outcome: Progressing   Problem: Clinical Measurements: Goal: Ability to maintain  clinical measurements within normal limits will improve Outcome: Progressing   Problem: Clinical Measurements: Goal: Will remain free from infection Outcome: Progressing   Problem: Clinical Measurements: Goal: Diagnostic test results will improve Outcome: Progressing   Problem: Clinical Measurements: Goal: Respiratory complications will improve Outcome: Progressing   Problem: Clinical Measurements: Goal: Cardiovascular complication will be avoided Outcome: Progressing   Problem: Activity: Goal: Risk for activity intolerance will decrease Outcome: Progressing   Problem: Nutrition: Goal: Adequate nutrition will be maintained Outcome: Progressing   Problem: Coping: Goal: Level of anxiety will decrease Outcome: Progressing   Problem: Elimination: Goal: Will not experience complications related to bowel motility Outcome: Progressing   Problem: Elimination: Goal: Will not experience complications related to urinary retention Outcome: Progressing   Problem: Pain Managment: Goal: General experience of comfort will improve Outcome: Progressing   Problem: Safety: Goal: Ability to remain free from injury will improve Outcome: Progressing   Problem: Skin Integrity: Goal: Risk for impaired skin integrity will decrease Outcome: Progressing

## 2023-02-07 NOTE — Inpatient Diabetes Management (Signed)
Inpatient Diabetes Program Recommendations  AACE/ADA: New Consensus Statement on Inpatient Glycemic Control (2015)  Target Ranges:  Prepandial:   less than 140 mg/dL      Peak postprandial:   less than 180 mg/dL (1-2 hours)      Critically ill patients:  140 - 180 mg/dL   Lab Results  Component Value Date   GLUCAP 175 (H) 02/07/2023   HGBA1C 5.8 (H) 11/19/2022    Patient to be discharged home on tube feeds. Educated patient and spouse on insulin pen use at home. Reviewed contents of insulin flexpen starter kit. Reviewed all steps if insulin pen including attachment of needle, 2-unit air shot, dialing up dose, giving injection, removing needle, disposal of sharps, storage of unused insulin, disposal of insulin etc. Wife  able to provide successful return demonstration. Also reviewed troubleshooting with insulin pen. MD to give patient Rxs for insulin pens and insulin pen needles.  Reviewed normal fasting CBG levels and hypoglycemia protocol when patient is not able to take po. Patient needs a glucose meter and strip prescription on discharge. Wife and patient not interested in a glucose continuous sensor @ this time due to so many other things to get used to.  Thank you, Jon Bray. , RN, MSN, CDE  Diabetes Coordinator Inpatient Glycemic Control Team Team Pager [phone] (8am-5pm) 02/07/2023 2:02 PM

## 2023-02-08 ENCOUNTER — Other Ambulatory Visit (HOSPITAL_COMMUNITY): Payer: Self-pay

## 2023-02-08 DIAGNOSIS — K8591 Acute pancreatitis with uninfected necrosis, unspecified: Secondary | ICD-10-CM | POA: Diagnosis not present

## 2023-02-08 DIAGNOSIS — E440 Moderate protein-calorie malnutrition: Secondary | ICD-10-CM | POA: Diagnosis not present

## 2023-02-08 DIAGNOSIS — R112 Nausea with vomiting, unspecified: Secondary | ICD-10-CM | POA: Diagnosis not present

## 2023-02-08 DIAGNOSIS — R9389 Abnormal findings on diagnostic imaging of other specified body structures: Secondary | ICD-10-CM

## 2023-02-08 DIAGNOSIS — I2510 Atherosclerotic heart disease of native coronary artery without angina pectoris: Secondary | ICD-10-CM | POA: Diagnosis not present

## 2023-02-08 LAB — GLUCOSE, CAPILLARY: Glucose-Capillary: 179 mg/dL — ABNORMAL HIGH (ref 70–99)

## 2023-02-08 MED ORDER — "PEN NEEDLES 3/16"" 31G X 5 MM MISC": 1 refills | Status: AC

## 2023-02-08 MED ORDER — BASAGLAR KWIKPEN 100 UNIT/ML ~~LOC~~ SOPN: 10.0000 [IU] | PEN_INJECTOR | Freq: Every day | SUBCUTANEOUS | 0 refills | Status: DC

## 2023-02-08 MED ORDER — ACETAMINOPHEN 325 MG PO TABS: 650.0000 mg | ORAL_TABLET | Freq: Four times a day (QID) | ORAL | Status: AC | PRN

## 2023-02-08 MED ORDER — OSMOLITE 1.5 CAL PO LIQD: 1440.0000 mL | ORAL | 2 refills | Status: DC

## 2023-02-08 MED ORDER — POLYETHYLENE GLYCOL 3350 17 GM/SCOOP PO POWD: 17.0000 g | Freq: Two times a day (BID) | ORAL | 2 refills | Status: AC | PRN

## 2023-02-08 MED ORDER — BASAGLAR KWIKPEN 100 UNIT/ML ~~LOC~~ SOPN
10.0000 [IU] | PEN_INJECTOR | Freq: Every day | SUBCUTANEOUS | 0 refills | Status: AC
  Filled 2023-02-08: qty 3, 30d supply, fill #0

## 2023-02-08 MED ORDER — OXYCODONE HCL 5 MG PO TABS
5.0000 mg | ORAL_TABLET | Freq: Three times a day (TID) | ORAL | 0 refills | Status: DC | PRN
Start: 2023-02-08 — End: 2023-02-26

## 2023-02-08 MED ORDER — PANTOPRAZOLE SODIUM 40 MG PO TBEC: 40.0000 mg | DELAYED_RELEASE_TABLET | Freq: Two times a day (BID) | ORAL | 0 refills | Status: AC

## 2023-02-08 MED ORDER — PROSOURCE TF20 ENFIT COMPATIBL EN LIQD: 60.0000 mL | Freq: Every day | ENTERAL | 2 refills | Status: DC

## 2023-02-08 MED ORDER — BLOOD GLUCOSE MONITORING SUPPL DEVI: 1.0000 | Freq: Three times a day (TID) | 0 refills | Status: AC

## 2023-02-08 MED ORDER — BLOOD GLUCOSE TEST VI STRP: 1.0000 | ORAL_STRIP | Freq: Three times a day (TID) | 0 refills | Status: AC

## 2023-02-08 MED ORDER — FREE WATER: 100.0000 mL | 2 refills | Status: DC

## 2023-02-08 MED ORDER — LANCET DEVICE MISC
1.0000 | Freq: Three times a day (TID) | 0 refills | Status: AC
Start: 2023-02-08 — End: 2023-03-10

## 2023-02-08 NOTE — Progress Notes (Signed)
   Patient well known to our service - has severe recurrent/persistent pancreatitis and part of his treatment has been nasoenteric tube nutrition.  Due to the severity and extent of his pancreatitis and severe gastritis  patient is not a candidate for Gastrostomy or jejunostomy tube. We anticiapate long term need for enteral feeding at least 90 days post discharge.  Iva Boop, MD, [facility] See Loretha Stapler on call - gastroenterology for best contact person 02/08/2023 10:51 AM

## 2023-02-08 NOTE — Progress Notes (Signed)
Explained discharge instructions to patient's wife. Reviewed follow up appointment and next medication administration times. Also reviewed education. Patient verbalized having an understanding for instructions given. Awaiting Pam wit [employer] to explaining tube feed administration. All belongings are in the patient's possession to include TOC med. Patient's RN is aware.

## 2023-02-08 NOTE — Plan of Care (Signed)
Patient alert/oriented X4. Patient compliant with medication administration and tube feeding education provided along with insulin administration education. Patient PIV removed prior to discharge and AVS discharge instructions provided and explained in detail. Patient has personal belongings packed at bedside along with tube feeding/insulin administration equipment. Patient was up in chair for a few hours and VSS upon discharge. No complaints at this time.   Problem: Education: Goal: Ability to describe self-care measures that may prevent or decrease complications (Diabetes Survival Skills Education) will improve Outcome: Adequate for Discharge   Problem: Education: Goal: Individualized Educational Video(s) Outcome: Adequate for Discharge   Problem: Coping: Goal: Ability to adjust to condition or change in health will improve Outcome: Adequate for Discharge   Problem: Fluid Volume: Goal: Ability to maintain a balanced intake and output will improve Outcome: Adequate for Discharge   Problem: Health Behavior/Discharge Planning: Goal: Ability to identify and utilize available resources and services will improve Outcome: Adequate for Discharge   Problem: Health Behavior/Discharge Planning: Goal: Ability to manage health-related needs will improve Outcome: Adequate for Discharge   Problem: Metabolic: Goal: Ability to maintain appropriate glucose levels will improve Outcome: Adequate for Discharge   Problem: Nutritional: Goal: Maintenance of adequate nutrition will improve Outcome: Adequate for Discharge   Problem: Nutritional: Goal: Progress toward achieving an optimal weight will improve Outcome: Adequate for Discharge   Problem: Skin Integrity: Goal: Risk for impaired skin integrity will decrease Outcome: Adequate for Discharge   Problem: Tissue Perfusion: Goal: Adequacy of tissue perfusion will improve Outcome: Adequate for Discharge   Problem: Education: Goal: Knowledge  of General Education information will improve Description: Including pain rating scale, medication(s)/side effects and non-pharmacologic comfort measures Outcome: Adequate for Discharge   Problem: Health Behavior/Discharge Planning: Goal: Ability to manage health-related needs will improve Outcome: Adequate for Discharge   Problem: Clinical Measurements: Goal: Ability to maintain clinical measurements within normal limits will improve Outcome: Adequate for Discharge   Problem: Clinical Measurements: Goal: Will remain free from infection Outcome: Adequate for Discharge   Problem: Clinical Measurements: Goal: Diagnostic test results will improve Outcome: Adequate for Discharge   Problem: Clinical Measurements: Goal: Respiratory complications will improve Outcome: Adequate for Discharge   Problem: Clinical Measurements: Goal: Cardiovascular complication will be avoided Outcome: Adequate for Discharge   Problem: Activity: Goal: Risk for activity intolerance will decrease Outcome: Adequate for Discharge   Problem: Nutrition: Goal: Adequate nutrition will be maintained Outcome: Adequate for Discharge   Problem: Coping: Goal: Level of anxiety will decrease Outcome: Adequate for Discharge   Problem: Elimination: Goal: Will not experience complications related to bowel motility Outcome: Adequate for Discharge   Problem: Elimination: Goal: Will not experience complications related to urinary retention Outcome: Adequate for Discharge   Problem: Pain Managment: Goal: General experience of comfort will improve Outcome: Adequate for Discharge   Problem: Safety: Goal: Ability to remain free from injury will improve Outcome: Adequate for Discharge   Problem: Skin Integrity: Goal: Risk for impaired skin integrity will decrease Outcome: Adequate for Discharge

## 2023-02-08 NOTE — Discharge Summary (Signed)
Physician Discharge Summary  Jon Bray UJW:[id number] DOB: 15-Sep-1940 DOA: 01/28/2023  PCP: Danella Penton, MD  Admit date: 01/28/2023 Discharge date: 02/08/2023 Admitted From: Home Disposition: Home Recommendations for Outpatient Follow-up:  Follow up with PCP in 1 week Outpatient follow-up with GI on 02/22/2023 Check CBC and CMP in 1 week Please follow up on the following pending results: None  Home Health: [facility] RN Equipment/Devices: Tube feed  Discharge Condition: Stable CODE STATUS: Full code  Follow-up Information     [facility] Follow up.   Specialty: Home Health Services Why: The home health agency will contact you for the first home visit. Contact information: [address] [place] [address] [phone]         Danella Penton, MD. Schedule an appointment as soon as possible for a visit in 1 week(s).   Specialty: Internal Medicine Contact information: [phone] [facility] [address] [facility] [place] [address] [phone]                 Hospital course 82 y.o.m w/ CAD,PAF-not anticoagulated, and admission in May 2024 for acute gallstone pancreatitis s/p cholecystectomy on 12/03/2022 presented 01/28/23 with abdominal pain, nausea, inability to take adequate PO found to have AKI, dehydration. CT is concerning for severe acute pancreatitis with increase in number and size of fluid collections.  No biliary ductal dilatation noted on CT. GI was consulted. Underwent EGD 7/29 showing benign esophageal stenosis hiatal hernia gastric fundus with ulcerated mucosa congested.  Started on tube feed via cortrack on 7/31.  Also on clear liquid diet.  Improved clinically.  Tolerated tube feed at goal.  Discharged on tube feed via cortrack per recommendation by GI.  Outpatient follow-up with GI on 02/22/2023.  HH RN for tube feed management.  In regards to hypoglycemia, stop glipizide and started basal insulin given pancreatitis.  See  individual problem list below for more.   Problems addressed during this hospitalization Principal Problem:   Acute pancreatitis Active Problems:   CAD (coronary artery disease)   PAF (paroxysmal atrial fibrillation) (HCC)   Nausea and vomiting   Abnormal CT scan   Malnutrition of moderate degree   Acute gallstone pancreatitis: History of acute gallbladder pancreatitis status postcholecystectomy in 11/2022.  CT abdomen and pelvis on 7/26 with severe acute pancreatitis in the head of the pancreas with interval increase in number and size of multiple loculated fluid collections.  LFT within normal.  Lipase improved from 1258-65.  No pancreatic necrosis.  Clinically improving. -Discharged on tube feed via cortrack until follow-up with GI on 8/20.   -Continue clear liquid diet on discharge. -Continue PPI twice daily.   Elevated D-dimer: Likely related to pancreatitis.  CT angio negative for PE.  Lower extremity venous Doppler negative for DVT.   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 -Changed glipizide to basal insulin given 83 pancreatitis.   CAD:No anginal symptoms. -Continue ASA 81mg .   PAF:Not anticoagulated or rate-control agents pta     AKI: Resolved.   Gastritis/esophagitis -Continue PPI twice daily.   Normocytic anemia: Stable -Recheck CBC in 1 week   Hypokalemia: Resolved    Fatigue/physical deconditioning: Improved.  Evaluated by therapy and no need identified.   Mild leukocytosis: Stable.  Feels well. -Recheck CBC in 1 week   Hyponatremia: Mild -Recheck at follow-up.   Moderate malnutrition/unintentional weight loss Nutrition Problem: Moderate Malnutrition Etiology: chronic illness (recurrennt pancreatitis) Signs/Symptoms: moderate fat depletion, severe muscle depletion Interventions: Refer  to RD note for recommendations     Time spent 35 minutes  Vital signs Vitals:   02/07/23 1925 02/08/23 0500 02/08/23 0510 02/08/23 0700  BP: 109/64  (!) 100/55 (!)  143/79  Pulse: 89  87 91  Temp: 98.5 F (36.9 C)  98 F (36.7 C) (!) 97.3 F (36.3 C)  Resp: 16  17 18   Height:      Weight:  68.6 kg    SpO2: 97%  96% 97%  TempSrc: Oral  Oral Oral  BMI (Calculated):  23       Discharge exam  GENERAL: No apparent distress.  Nontoxic. HEENT: MMM.  Vision and hearing grossly intact.  Feeding tube in place. NECK: Supple.  No apparent JVD.  RESP:  No IWOB.  Fair aeration bilaterally. CVS:  RRR. Heart sounds normal.  ABD/GI/GU: BS+. Abd soft.  Mild upper abdominal tenderness. MSK/EXT:  Moves extremities. No apparent deformity. No edema.  SKIN: no apparent skin lesion or wound NEURO: Awake and alert. Oriented appropriately.  No apparent focal neuro deficit. PSYCH: Calm. Normal affect.   Discharge Instructions Discharge Instructions     Diet clear liquid   Complete by: As directed    Discharge instructions   Complete by: As directed    It has been a pleasure taking care of you!  You were hospitalized due to acute pancreatitis for which you have been started on tube feed.  We are discharging you on tube feed until you follow-up with your gastroenterologist.  We have made some changes to your medications during this hospitalization.  Please review your new medication list and the directions on your medications before you take them.  Follow-up with your primary care doctor in 1 to 2 weeks or sooner if needed.  Follow-up with your gastroenterologist per the recommendation.   Take care,   Increase activity slowly   Complete by: As directed       Allergies as of 02/08/2023   No Known Allergies      Medication List     STOP taking these medications    cholestyramine light 4 g packet Commonly known as: PREVALITE   glipiZIDE 5 MG 24 hr tablet Commonly known as: GLUCOTROL XL   megestrol 20 MG tablet Commonly known as: MEGACE       TAKE these medications    acetaminophen 325 MG tablet Commonly known as: TYLENOL Place 2 tablets (650  mg total) into feeding tube every 6 (six) hours as needed for mild pain.   aspirin 81 MG chewable tablet Chew 81 mg by mouth daily.   Blood Glucose Monitoring Suppl Devi 1 each by Does not apply route in the morning, at noon, and at bedtime. May substitute to any manufacturer covered by patient's insurance.   BLOOD GLUCOSE TEST STRIPS Strp 1 each by In Vitro route in the morning, at noon, and at bedtime. May substitute to any manufacturer covered by patient's insurance.   Creon 36000-114000 units Cpep capsule Generic drug: lipase/protease/amylase Take 36,000 Units by mouth 3 (three) times daily.   DSS 100 MG Caps Take 1 capsule by mouth daily.   feeding supplement (OSMOLITE 1.5 CAL) Liqd Place 1,440 mLs into feeding tube daily. What changed:  how much to take how to take this when to take this additional instructions   feeding supplement (PROSource TF20) liquid Place 60 mLs into feeding tube daily. Start taking on: February 09, 2023   free water Soln Place 100 mLs into feeding tube every  3 (three) hours.   Lancet Device Misc 1 each by Does not apply route in the morning, at noon, and at bedtime. May substitute to any manufacturer covered by patient's insurance.   Lantus SoloStar 100 UNIT/ML Solostar Pen Generic drug: insulin glargine Inject 10 Units into the skin daily.   multivitamin tablet Take 1 tablet by mouth daily.   ondansetron 4 MG disintegrating tablet Commonly known as: ZOFRAN-ODT Take 4 mg by mouth every 8 (eight) hours as needed for refractory nausea / vomiting.   oxyCODONE 5 MG immediate release tablet Commonly known as: Roxicodone Take 1 tablet (5 mg total) by mouth every 8 (eight) hours as needed for up to 7 days for severe pain or moderate pain.   pantoprazole 40 MG tablet Commonly known as: PROTONIX Take 1 tablet (40 mg total) by mouth 2 (two) times daily. What changed: when to take this   Pen Needles 3/16" 31G X 5 MM Misc Use to inject insulin    polyethylene glycol powder 17 GM/SCOOP powder Commonly known as: MiraLax Take 17 g by mouth 2 (two) times daily as needed for moderate constipation or mild constipation. What changed:  how much to take when to take this reasons to take this   sodium chloride 5 % ophthalmic ointment Commonly known as: MURO 128 Place 1 Application into both eyes at bedtime.   VITAMIN C ER PO Take 1 tablet by mouth daily.   ZINC-220 PO Take 1 tablet by mouth daily.               Durable Medical Equipment  (From admission, onward)           Start     Ordered   02/08/23 1104  For home use only DME Tube feeding  Once       Comments: This will be required after discharge and given via Cortrak tube as patient not a candidate for J-tube, as he is not a surgical candidate.   Anticipate long term need.    Tube feeds via Cortrak:  Osmolite 1.5 at 90 mL/hr x 16 hours from 1800 to 1000 (1440 mL per day or 6 cartons per day)  60 mL ProSource TF20 - daily (not needed at discharged)  Free water flush: 100 mL before and after connecting from pump + 6 additional 100 mL flushes throughout the day  Regimen provides 2213 kcal, 109 gm protein, and 1889 mL total; free water daily.   02/08/23 1103            Consultations: Gastroenterology  Procedures/Studies:   VAS [place] LOWER EXTREMITY VENOUS (DVT)  Result Date: 02/02/2023  Lower Venous DVT Study Patient Name:  Jon Bray [facility]  Date of Exam:   02/02/2023 Medical Rec #: [id number]     Accession #:    [id number] Date of Birth: 01/02/1941      Patient Gender: M Patient Age:   50 years Exam Location:  [facility] Procedure:      VAS [place] LOWER EXTREMITY VENOUS (DVT) Referring Phys: RAMESH KC --------------------------------------------------------------------------------  Indications: Swelling.  Risk Factors: Recent Extended Hospital Stay Surgery 01/31/23. Comparison Study: No prior study Performing Technologist: Shona Simpson  Examination  Guidelines: A complete evaluation includes B-mode imaging, spectral Doppler, color Doppler, and power Doppler as needed of all accessible portions of each vessel. Bilateral testing is considered an integral part of a complete examination. Limited examinations for reoccurring indications may be performed as noted. The reflux portion of the exam is performed  with the patient in reverse Trendelenburg.  +---------+---------------+---------+-----------+----------+--------------+ RIGHT    CompressibilityPhasicitySpontaneityPropertiesThrombus Aging +---------+---------------+---------+-----------+----------+--------------+ CFV      Full           Yes      Yes                                 +---------+---------------+---------+-----------+----------+--------------+ SFJ      Full                                                        +---------+---------------+---------+-----------+----------+--------------+ FV Prox  Full                                                        +---------+---------------+---------+-----------+----------+--------------+ FV Mid   Full                                                        +---------+---------------+---------+-----------+----------+--------------+ FV DistalFull                                                        +---------+---------------+---------+-----------+----------+--------------+ PFV      Full                                                        +---------+---------------+---------+-----------+----------+--------------+ POP      Full           Yes      Yes                                 +---------+---------------+---------+-----------+----------+--------------+ PTV      Full                                                        +---------+---------------+---------+-----------+----------+--------------+ PERO     Full                                                         +---------+---------------+---------+-----------+----------+--------------+   +---------+---------------+---------+-----------+----------+--------------+ LEFT     CompressibilityPhasicitySpontaneityPropertiesThrombus Aging +---------+---------------+---------+-----------+----------+--------------+ CFV      Full           Yes      Yes                                 +---------+---------------+---------+-----------+----------+--------------+  SFJ      Full                                                        +---------+---------------+---------+-----------+----------+--------------+ FV Prox  Full                                                        +---------+---------------+---------+-----------+----------+--------------+ FV Mid   Full                                                        +---------+---------------+---------+-----------+----------+--------------+ FV DistalFull                                                        +---------+---------------+---------+-----------+----------+--------------+ PFV      Full                                                        +---------+---------------+---------+-----------+----------+--------------+ POP      Full           Yes      Yes                                 +---------+---------------+---------+-----------+----------+--------------+ PTV      Full                                                        +---------+---------------+---------+-----------+----------+--------------+ PERO     Full                                                        +---------+---------------+---------+-----------+----------+--------------+     Summary: BILATERAL: - No evidence of deep vein thrombosis seen in the lower extremities, bilaterally. -No evidence of popliteal cyst, bilaterally.   *See table(s) above for measurements and observations. Electronically signed by Heath Lark on 02/02/2023 at 5:34:12 PM.     Final    DG Abd Portable 1V  Result Date: 02/02/2023 CLINICAL DATA:  Evaluate feeding tube placement EXAM: PORTABLE ABDOMEN - 1 VIEW COMPARISON:  CT 01/28/2023 FINDINGS: Interval placement of feeding tube. The tip appears post pyloric in the expected location of the distal portion of the descending duodenum. Surgical clips identified within the right upper quadrant of the abdomen. Aortic atherosclerotic  calcifications identified IMPRESSION: Feeding tube tip appears post pyloric in the expected location of the distal portion of the descending duodenum. Electronically Signed   By: Signa Kell M.D.   On: 02/02/2023 12:57   CT Angio Chest Pulmonary Embolism (PE) W or WO Contrast  Result Date: 02/01/2023 CLINICAL DATA:  Chest pain. EXAM: CT ANGIOGRAPHY CHEST WITH CONTRAST TECHNIQUE: Multidetector CT imaging of the chest was performed using the standard protocol during bolus administration of intravenous contrast. Multiplanar CT image reconstructions and MIPs were obtained to evaluate the vascular anatomy. RADIATION DOSE REDUCTION: This exam was performed according to the departmental dose-optimization program which includes automated exposure control, adjustment of the mA and/or kV according to patient size and/or use of iterative reconstruction technique. CONTRAST:  75mL OMNIPAQUE IOHEXOL 350 MG/ML SOLN COMPARISON:  Chest radiograph dated 02/01/2023. CT dated 12/08/2018. FINDINGS: Cardiovascular: Mild cardiomegaly. No pericardial effusion. Three-vessel coronary vascular calcification. Moderate atherosclerotic calcification of the thoracic aorta. No aneurysmal dilatation. No pulmonary artery embolus identified. Mediastinum/Nodes: No hilar or mediastinal adenopathy. The esophagus is grossly unremarkable. No mediastinal fluid collection. Lungs/Pleura: Minimal bibasilar subpleural atelectasis/scarring. No focal consolidation, pleural effusion, or pneumothorax. The central airways are patent. Upper Abdomen:  Partially visualized trace perihepatic fluid. Air in the central liver, likely pneumobilia. Musculoskeletal: Osteopenia.  No acute osseous pathology. Review of the MIP images confirms the above findings. IMPRESSION: 1. No CT evidence of pulmonary embolism. 2. Mild cardiomegaly. 3.  Aortic Atherosclerosis (ICD10-I70.0). Electronically Signed   By: Elgie Collard M.D.   On: 02/01/2023 20:19   DG Chest Port 1 View  Result Date: 02/01/2023 CLINICAL DATA:  [id number] Rib pain [id number] EXAM: PORTABLE CHEST 1 VIEW COMPARISON:  12/07/2022. FINDINGS: Bilateral lung fields are clear. Bilateral costophrenic angles are clear. Normal cardio-mediastinal silhouette. Aortic arch calcifications noted. No acute osseous abnormalities. The soft tissues are within normal limits. IMPRESSION: 1. No active disease. Aortic Atherosclerosis (ICD10-I70.0). Electronically Signed   By: Jules Schick M.D.   On: 02/01/2023 17:15   CT ABDOMEN PELVIS W CONTRAST  Result Date: 01/28/2023 CLINICAL DATA:  Severe acute pancreatitis EXAM: CT ABDOMEN AND PELVIS WITH CONTRAST TECHNIQUE: Multidetector CT imaging of the abdomen and pelvis was performed using the standard protocol following bolus administration of intravenous contrast. RADIATION DOSE REDUCTION: This exam was performed according to the departmental dose-optimization program which includes automated exposure control, adjustment of the mA and/or kV according to patient size and/or use of iterative reconstruction technique. CONTRAST:  75mL OMNIPAQUE IOHEXOL 350 MG/ML SOLN COMPARISON:  Previous studies including the examination of 01/24/2023 FINDINGS: Lower chest: Visualized lower lung fields are clear. Coronary artery calcifications are seen. Hepatobiliary: Surgical clips are seen in gallbladder fossa. There are pockets of air in intrahepatic bile ducts. Bile ducts are not dilated. Pancreas: There is enlargement of the head of the pancreas. There are multiple loculated fluid collections in  and adjacent to head and proximal body of pancreas. There is interval increase in number and size of the loculated fluid collections in and around head of the pancreas due to severe acute pancreatitis. Largest of these collections measures 4.9 x 3.5 cm in the anterior aspect of the head. There are multiple other smaller loculated fluid collections. Overall size body and tail of pancreas has not changed. There is homogeneous enhancement in body and tail of pancreas. There is 2.2 cm low-density in the anterior margin of tail of pancreas with no significant change. Spleen: Unremarkable. Adrenals/Urinary Tract: Adrenals are unremarkable. There is no hydronephrosis. There are no  renal or ureteral stones. Urinary bladder is not distended. Stomach/Bowel: There is wall thickening in the antrum of the stomach and duodenum. Rest of the small bowel loops are unremarkable. The appendix is not dilated. There is no significant wall thickening in colon. Multiple diverticula are seen in the colon, especially in sigmoid with no evidence of focal acute diverticulitis. Vascular/Lymphatic: Calcifications are seen in aorta and its major branches. Reproductive: Unremarkable. Other: There is no ascites or pneumoperitoneum. Umbilical hernia containing fat is seen. Right inguinal hernia containing fat is seen. Possible small left inguinal hernia containing fat is seen. Musculoskeletal: Degenerative changes are noted in lumbar spine, more severe at L2-L3 level. There is encroachment of neural foramina at multiple levels. IMPRESSION: Severe acute pancreatitis in the head of the pancreas. There is interval increase in number and size of multiple loculated fluid collections in and around head of the pancreas suggesting interval worsening of acute pancreatitis with possible multiple pseudocysts or foci of pancreatic necrosis. Largest of these fluid collections measures 4.9 x 3.5 cm. There are no pockets of air within these fluid collections.  There is a 2.2 cm low-density in the tail of pancreas with no interval change. There is no definite demonstrable new focal pancreatic necrosis in the body and tail. There is wall thickening in the distal antrum of the stomach and the duodenum, possibly gastritis and duodenitis There is no evidence of intestinal obstruction or pneumoperitoneum. Appendix is not dilated. There is no hydronephrosis. Diverticulosis of colon without signs of focal diverticulitis. Aortic arteriosclerosis. Coronary artery disease. Lumbar spondylosis. Electronically Signed   By: Ernie Avena M.D.   On: 01/28/2023 20:18   CT ABDOMEN PELVIS W WO CONTRAST  Result Date: 01/26/2023 CLINICAL DATA:  Follow up necrotizing pancreatitis. Previous hospitalization in May and June. Patient reports improved symptoms. EXAM: CT ABDOMEN AND PELVIS WITHOUT AND WITH CONTRAST TECHNIQUE: Multidetector CT imaging of the abdomen and pelvis was performed following the standard protocol before and following the bolus administration of intravenous contrast. RADIATION DOSE REDUCTION: This exam was performed according to the departmental dose-optimization program which includes automated exposure control, adjustment of the mA and/or kV according to patient size and/or use of iterative reconstruction technique. CONTRAST:  OMNIPAQUE IOHEXOL 300 MG/ML  SOLN COMPARISON:  Abdominopelvic CT 12/24/2022, 12/06/2022 and 11/16/2022. Abdominal MRI 11/29/2022. FINDINGS: Lower chest: Clear lung bases. No significant pleural or pericardial effusion. Atherosclerosis of the aorta and coronary arteries again noted. Hepatobiliary: The liver is normal in density without suspicious focal abnormality. No biliary dilatation status post cholecystectomy. There is a small amount of pneumobilia within the common hepatic duct and left intrahepatic bile ducts. Pancreas: Pre contrast images demonstrate no evidence of acute pancreatic hemorrhage. Peripancreatic fluid collections  are similar in size to the most recent prior study. A collection involving the pancreatic head and neck appears slightly better defined, measuring 5.9 x 2.7 cm on image 27/12. A small collection adjacent to the pancreatic tail measuring 2.3 x 2.4 cm on image 20/12 previously measured 2.8 x 2.7 cm. No new or enlarging collections are identified. There is no pancreatic ductal dilatation. The remaining pancreatic parenchyma enhances normally after contrast. Spleen: Normal in size without focal abnormality. Adrenals/Urinary Tract: Both adrenal glands appear normal. No evidence of urinary tract calculus, suspicious renal lesion or hydronephrosis. The bladder appears unremarkable for its degree of distention. Stomach/Bowel: No enteric contrast administered. The stomach appears unremarkable for its degree of distension. No evidence of bowel wall thickening, distention or surrounding inflammatory change.  The appendix appears normal. There are diverticular changes within the sigmoid colon without evidence of acute inflammation. Mildly prominent stool throughout the colon. Vascular/Lymphatic: There are no enlarged abdominal or pelvic lymph nodes. Aortic and branch vessel atherosclerosis without evidence of aneurysm or large vessel occlusion. The portal, superior mesenteric and splenic veins remain patent. Reproductive: The prostate gland and seminal vesicles appear unremarkable. Other: No evidence of abdominal wall mass or hernia. No ascites or pneumoperitoneum. Musculoskeletal: No acute or significant osseous findings. Multilevel lumbar spondylosis with mild degenerative changes of both hips and sacroiliac joints. Unless specific follow-up recommendations are mentioned in the findings or impression sections, no imaging follow-up of any mentioned incidental findings is recommended. IMPRESSION: 1. No significant change in size of peripancreatic fluid collections compared with the most recent prior study. These collections may  reflect organizing pseudocysts or walled off necrosis in the setting of previous pancreatic necrosis. No evidence of acute pancreatic hemorrhage. 2. No new or enlarging collections. 3. No biliary dilatation status post cholecystectomy. Small amount of pneumobilia. 4. Sigmoid diverticulosis without evidence of acute inflammation. Mildly prominent stool throughout the colon. 5. Coronary and aortic Atherosclerosis (ICD10-I70.0). Electronically Signed   By: Carey Bullocks M.D.   On: 01/26/2023 17:25       The results of significant diagnostics from this hospitalization (including imaging, microbiology, ancillary and laboratory) are listed below for reference.     Microbiology: No results found for this or any previous visit (from the past 240 hour(s)).   Labs:  CBC: Recent Labs  Lab 02/04/23 0920 02/05/23 0116 02/06/23 0129 02/07/23 0840 02/07/23 2359  WBC 10.2 13.7* 15.3* 14.9* 15.3*  NEUTROABS  --   --  10.6* 10.4*  --   HGB 10.9* 11.7* 11.3* 11.0* 10.8*  HCT 33.7* 36.1* 33.5* 33.6* 33.0*  MCV 85.3 84.1 85.7 84.4 86.4  PLT 253 303 279 280 278   BMP &GFR Recent Labs  Lab 02/03/23 1735 02/04/23 0920 02/05/23 0116 02/06/23 0129 02/07/23 0840 02/07/23 2359  NA  --  137 133* 131* 130* 132*  K  --  4.9 4.0 4.2 4.6 4.2  CL  --  101 102 99 98 100  CO2  --  23 22 22  19* 22  GLUCOSE  --  204* 174* 216* 195* 278*  BUN  --  11 15 20 20 23   CREATININE  --  0.83 0.80 0.80 0.78 0.95  CALCIUM  --  8.6* 8.4* 8.1* 8.1* 8.2*  MG 1.9 1.9 2.0 2.0 2.0 1.9  PHOS 3.7  --  3.5 3.1 4.0 3.3   Estimated Creatinine Clearance: 57 mL/min (by C-G formula based on SCr of 0.95 mg/dL). Liver & Pancreas: Recent Labs  Lab 02/02/23 0214 02/03/23 0528 02/04/23 0920 02/05/23 0116 02/06/23 0129 02/07/23 0840 02/07/23 2359  AST 21 18 20 23   --  31  --   ALT 28 25 24 30   --  26  --   ALKPHOS 70 66 65 73  --  68  --   BILITOT 0.9 0.8 0.5 0.7  --  0.8  --   PROT 6.0* 6.5 5.9* 6.5  --  6.5  --    ALBUMIN 2.2* 2.3* 2.3* 2.4* 2.3* 2.3* 2.3*   No results for input(s): "LIPASE", "AMYLASE" in the last 168 hours. No results for input(s): "AMMONIA" in the last 168 hours. Diabetic: No results for input(s): "HGBA1C" in the last 72 hours. Recent Labs  Lab 02/07/23 0032 02/07/23 0444 02/07/23 0828 02/07/23 2128 02/08/23  0742  GLUCAP 179* 169* 175* 168* 179*   Cardiac Enzymes: No results for input(s): "CKTOTAL", "CKMB", "CKMBINDEX", "TROPONINI" in the last 168 hours. No results for input(s): "PROBNP" in the last 8760 hours. Coagulation Profile: No results for input(s): "INR", "PROTIME" in the last 168 hours. Thyroid Function Tests: No results for input(s): "TSH", "T4TOTAL", "FREET4", "T3FREE", "THYROIDAB" in the last 72 hours. Lipid Profile: No results for input(s): "CHOL", "HDL", "LDLCALC", "TRIG", "CHOLHDL", "LDLDIRECT" in the last 72 hours. Anemia Panel: No results for input(s): "VITAMINB12", "FOLATE", "FERRITIN", "TIBC", "IRON", "RETICCTPCT" in the last 72 hours. Urine analysis:    Component Value Date/Time   COLORURINE AMBER (A) 01/28/2023 1312   APPEARANCEUR HAZY (A) 01/28/2023 1312   APPEARANCEUR Clear 08/28/2021 1113   LABSPEC 1.031 (H) 01/28/2023 1312   PHURINE 5.0 01/28/2023 1312   GLUCOSEU NEGATIVE 01/28/2023 1312   HGBUR NEGATIVE 01/28/2023 1312   BILIRUBINUR NEGATIVE 01/28/2023 1312   BILIRUBINUR Negative 08/28/2021 1113   KETONESUR NEGATIVE 01/28/2023 1312   PROTEINUR 100 (A) 01/28/2023 1312   NITRITE NEGATIVE 01/28/2023 1312   LEUKOCYTESUR NEGATIVE 01/28/2023 1312   Sepsis Labs: Invalid input(s): "PROCALCITONIN", "LACTICIDVEN"   SIGNED:  Almon Hercules, MD  [facility] 02/08/2023, 2:24 PM

## 2023-02-08 NOTE — Plan of Care (Signed)

## 2023-02-08 NOTE — Progress Notes (Signed)
Physical Therapy Treatment Patient Details Name: Jon Bray MRN: [id number] DOB: 31-Jul-1940 Today's Date: 02/08/2023   History of Present Illness 82 y.o. male presents to [facility] hospital on 01/28/2023 with abdominal pain, nausea and poor PO intake, found to have AKI. CT concerning for acute pancreatitis. EGD on 7/29 showing benign esophageal stenosis hiatal hernia gastric fundus with ulcerated mucosa congested. PMH significant for CAD, hypertension, afib, HLD, chronic renal insufficiency, nephrolithiasis, cholelithiasis, gallstone pancreatitis with retained biliary stone.    PT Comments  Pt received in supine, agreeable to therapy session after recently mobilizing in hallway per pt/spouse report. Pt performs bed mobility and transfers modI and gait in hallway without AD with no loss of balance. Pt with decreased gait speed and endurance from baseline per his report and needing intermittent use of railing to perform flight of stairs. Min safety cues provided for stair negotiation and gait but overall pt fairly steady this date. Pt scored 21/24 on Dynamic Gait Index, indicating no elevated risk of falls for older community dwelling adults. Pt continues to benefit from PT services to progress toward functional mobility goals, continue to recommend OPPT to work on strength/endurance as pt presently reports moderate to severe fatigue ~7/10 modified RPE with household distance gait trial and flight of stairs, which is below his reported functional baseline of gardening and golfing.    If plan is discharge home, recommend the following: A little help with bathing/dressing/bathroom;Assistance with cooking/housework;Help with stairs or ramp for entrance   Can travel by private vehicle        Equipment Recommendations  None recommended by PT    Recommendations for Other Services       Precautions / Restrictions Precautions Precautions: Fall Precaution Comments: cortrak Restrictions Weight Bearing  Restrictions: No     Mobility  Bed Mobility Overal bed mobility: Modified Independent                  Transfers Overall transfer level: Modified independent Equipment used: None Transfers: Sit to/from Stand Sit to Stand: Modified independent (Device/Increase time)                Ambulation/Gait Ambulation/Gait assistance: Supervision, Modified independent (Device/Increase time) Gait Distance (Feet): 240 Feet Assistive device: None Gait Pattern/deviations: Step-through pattern, Narrow base of support       General Gait Details: slowed step-through gait, narrow BOS x1 cues to slightly widen BOS for better stability. see DGI for higher level balance tasks.   Stairs Stairs: Yes Stairs assistance: Supervision Stair Management: One rail Right, No rails, Step to pattern, Alternating pattern, Forwards Number of Stairs: 10 General stair comments: alternating pattern ascending, step-to pattern descending, intermittent rail use descending, no rail ascending   Wheelchair Mobility     Tilt Bed    Modified Rankin (Stroke Patients Only)       Balance Overall balance assessment: Needs assistance Sitting-balance support: No upper extremity supported, Feet supported Sitting balance-Leahy Scale: Good     Standing balance support: No upper extremity supported, During functional activity Standing balance-Leahy Scale: Good                   Standardized Balance Assessment Standardized Balance Assessment : Dynamic Gait Index   Dynamic Gait Index Level Surface: Normal Change in Gait Speed: Normal Gait with Horizontal Head Turns: Normal Gait with Vertical Head Turns: Normal Gait and Pivot Turn: Normal Step Over Obstacle: Mild Impairment Step Around Obstacles: Normal Steps: Moderate Impairment Total Score: 21  Cognition Arousal/Alertness: Awake/alert Behavior During Therapy: WFL for tasks assessed/performed Overall Cognitive Status: Within  Functional Limits for tasks assessed                                          Exercises Other Exercises Other Exercises: reviewed use of green exercise band for LE exercises as tolerated including hip abduction and LAQ/HS curls with cues for safe band placement as he already has band in room    General Comments General comments (skin integrity, edema, etc.): cortrak in place but capped upon PTA arrival to room.      Pertinent Vitals/Pain Pain Assessment Pain Assessment: No/denies pain           PT Goals (current goals can now be found in the care plan section) Acute Rehab PT Goals PT Goal Formulation: With patient Time For Goal Achievement: 02/19/23 Progress towards PT goals: Progressing toward goals    Frequency    Min 1X/week      PT Plan Current plan remains appropriate       AM-PAC PT "6 Clicks" Mobility   Outcome Measure  Help needed turning from your back to your side while in a flat bed without using bedrails?: None Help needed moving from lying on your back to sitting on the side of a flat bed without using bedrails?: None Help needed moving to and from a bed to a chair (including a wheelchair)?: None Help needed standing up from a chair using your arms (e.g., wheelchair or bedside chair)?: None Help needed to walk in hospital room?: A Little Help needed climbing 3-5 steps with a railing? : A Little 6 Click Score: 22    End of Session Equipment Utilized During Treatment: Gait belt Activity Tolerance: Patient tolerated treatment well Patient left: in bed;with call bell/phone within reach;with family/visitor present (bed elevated partially into chair posture, pt encouraged to get OOB to chair as tolerated) Nurse Communication: Mobility status PT Visit Diagnosis: Other abnormalities of gait and mobility (R26.89);Muscle weakness (generalized) (M62.81)     Time: 1300-1309 PT Time Calculation (min) (ACUTE ONLY): 9 min  Charges:    $Gait  Training: 8-22 mins PT General Charges $$ ACUTE PT VISIT: 1 Visit                      P., PTA Acute Rehabilitation Services Secure Chat Preferred 9a-5:30pm Office: [phone]    Angus Palms 02/08/2023, 1:42 PM

## 2023-02-08 NOTE — TOC Transition Note (Signed)
Transition of Care [facility]) - CM/SW Discharge Note   Patient Details  Name: Jon Bray MRN: [id number] Date of Birth: 1940-12-19  Transition of Care [facility]) CM/SW Contact:  Lawerance Sabal, RN Phone Number: 02/08/2023, 1:20 PM   Clinical Narrative:     Haywood Lasso with [facility] aware that patient will DC to home today.  Pam w [employer] has obtained auth for tube feeds and pump. She will be in by 3pm today to educate patient's wife on administration, bedside nurse has been asked to show wife how to do water flush prior to this.  Patient will be transported home by wife and private car  Final next level of care: Home w Home Health Services Barriers to Discharge: No Barriers Identified   Patient Goals and CMS Choice CMS Medicare.gov Compare Post Acute Care list provided to:: Patient Choice offered to / list presented to : Patient, Spouse  Discharge Placement                         Discharge Plan and Services Additional resources added to the After Visit Summary for     Discharge Planning Services: CM Consult Post Acute Care Choice: Home Health          DME Arranged: Tube feeding, Tube feeding pump   Date DME Agency Contacted: 02/08/23 Time DME Agency Contacted: 1319 Representative spoke with at DME Agency: Fredrich Birks HH Arranged: RN HH Agency: [facility] Date [facility] Agency Contacted: 02/08/23 Time HH Agency Contacted: 1320 Representative spoke with at [facility] Agency: Haywood Lasso  Social Determinants of Health (SDOH) Interventions SDOH Screenings   Food Insecurity: No Food Insecurity (01/28/2023)  Housing: Low Risk  (01/28/2023)  Transportation Needs: No Transportation Needs (01/28/2023)  Utilities: Not At Risk (01/28/2023)  Depression (PHQ2-9): Low Risk  (11/17/2022)  Financial Resource Strain: Low Risk  (11/17/2022)  Physical Activity: Sufficiently Active (11/17/2022)  Social Connections: Socially Integrated (11/17/2022)  Stress: No Stress Concern Present (11/17/2022)  Tobacco  Use: Low Risk  (01/31/2023)  Recent Concern: Tobacco Use - Medium Risk (01/25/2023)   Received from [facility]     Readmission Risk Interventions     No data to display

## 2023-02-11 ENCOUNTER — Telehealth: Payer: Self-pay | Admitting: Internal Medicine

## 2023-02-11 ENCOUNTER — Telehealth: Payer: Self-pay | Admitting: Gastroenterology

## 2023-02-11 ENCOUNTER — Inpatient Hospital Stay (HOSPITAL_COMMUNITY)
Admission: EM | Admit: 2023-02-11 | Discharge: 2023-02-26 | DRG: 907 | Disposition: A | Discharge status: Home Health | Payer: HMO | Attending: Internal Medicine | Admitting: Internal Medicine

## 2023-02-11 ENCOUNTER — Other Ambulatory Visit: Payer: Self-pay

## 2023-02-11 ENCOUNTER — Encounter (HOSPITAL_COMMUNITY): Payer: Self-pay

## 2023-02-11 DIAGNOSIS — K859 Acute pancreatitis without necrosis or infection, unspecified: Secondary | ICD-10-CM

## 2023-02-11 DIAGNOSIS — I482 Chronic atrial fibrillation, unspecified: Secondary | ICD-10-CM | POA: Diagnosis not present

## 2023-02-11 DIAGNOSIS — K2210 Ulcer of esophagus without bleeding: Secondary | ICD-10-CM | POA: Diagnosis present

## 2023-02-11 DIAGNOSIS — Z6822 Body mass index (BMI) 22.0-22.9, adult: Secondary | ICD-10-CM

## 2023-02-11 DIAGNOSIS — K449 Diaphragmatic hernia without obstruction or gangrene: Secondary | ICD-10-CM | POA: Diagnosis present

## 2023-02-11 DIAGNOSIS — T85528A Displacement of other gastrointestinal prosthetic devices, implants and grafts, initial encounter: Secondary | ICD-10-CM | POA: Diagnosis not present

## 2023-02-11 DIAGNOSIS — Z794 Long term (current) use of insulin: Secondary | ICD-10-CM

## 2023-02-11 DIAGNOSIS — Z79899 Other long term (current) drug therapy: Secondary | ICD-10-CM

## 2023-02-11 DIAGNOSIS — Z9049 Acquired absence of other specified parts of digestive tract: Secondary | ICD-10-CM

## 2023-02-11 DIAGNOSIS — Z961 Presence of intraocular lens: Secondary | ICD-10-CM | POA: Diagnosis present

## 2023-02-11 DIAGNOSIS — Z980 Intestinal bypass and anastomosis status: Secondary | ICD-10-CM

## 2023-02-11 DIAGNOSIS — K222 Esophageal obstruction: Secondary | ICD-10-CM | POA: Diagnosis not present

## 2023-02-11 DIAGNOSIS — R051 Acute cough: Secondary | ICD-10-CM | POA: Diagnosis not present

## 2023-02-11 DIAGNOSIS — Z7982 Long term (current) use of aspirin: Secondary | ICD-10-CM

## 2023-02-11 DIAGNOSIS — I10 Essential (primary) hypertension: Secondary | ICD-10-CM | POA: Insufficient documentation

## 2023-02-11 DIAGNOSIS — E119 Type 2 diabetes mellitus without complications: Secondary | ICD-10-CM

## 2023-02-11 DIAGNOSIS — Z8679 Personal history of other diseases of the circulatory system: Secondary | ICD-10-CM

## 2023-02-11 DIAGNOSIS — Z85828 Personal history of other malignant neoplasm of skin: Secondary | ICD-10-CM

## 2023-02-11 DIAGNOSIS — Y831 Surgical operation with implant of artificial internal device as the cause of abnormal reaction of the patient, or of later complication, without mention of misadventure at the time of the procedure: Secondary | ICD-10-CM | POA: Diagnosis present

## 2023-02-11 DIAGNOSIS — Z4659 Encounter for fitting and adjustment of other gastrointestinal appliance and device: Secondary | ICD-10-CM

## 2023-02-11 DIAGNOSIS — E1122 Type 2 diabetes mellitus with diabetic chronic kidney disease: Secondary | ICD-10-CM | POA: Diagnosis present

## 2023-02-11 DIAGNOSIS — Z7901 Long term (current) use of anticoagulants: Secondary | ICD-10-CM

## 2023-02-11 DIAGNOSIS — K3189 Other diseases of stomach and duodenum: Secondary | ICD-10-CM | POA: Diagnosis present

## 2023-02-11 DIAGNOSIS — Z955 Presence of coronary angioplasty implant and graft: Secondary | ICD-10-CM

## 2023-02-11 DIAGNOSIS — Z8719 Personal history of other diseases of the digestive system: Secondary | ICD-10-CM

## 2023-02-11 DIAGNOSIS — K30 Functional dyspepsia: Secondary | ICD-10-CM | POA: Diagnosis not present

## 2023-02-11 DIAGNOSIS — D72829 Elevated white blood cell count, unspecified: Secondary | ICD-10-CM | POA: Insufficient documentation

## 2023-02-11 DIAGNOSIS — K2289 Other specified disease of esophagus: Secondary | ICD-10-CM | POA: Diagnosis present

## 2023-02-11 DIAGNOSIS — E43 Unspecified severe protein-calorie malnutrition: Secondary | ICD-10-CM | POA: Diagnosis present

## 2023-02-11 DIAGNOSIS — N329 Bladder disorder, unspecified: Secondary | ICD-10-CM | POA: Diagnosis present

## 2023-02-11 DIAGNOSIS — K311 Adult hypertrophic pyloric stenosis: Secondary | ICD-10-CM | POA: Diagnosis present

## 2023-02-11 DIAGNOSIS — I2510 Atherosclerotic heart disease of native coronary artery without angina pectoris: Secondary | ICD-10-CM | POA: Diagnosis present

## 2023-02-11 DIAGNOSIS — E872 Acidosis, unspecified: Secondary | ICD-10-CM | POA: Diagnosis not present

## 2023-02-11 DIAGNOSIS — K2970 Gastritis, unspecified, without bleeding: Secondary | ICD-10-CM | POA: Diagnosis present

## 2023-02-11 DIAGNOSIS — D638 Anemia in other chronic diseases classified elsewhere: Secondary | ICD-10-CM | POA: Diagnosis not present

## 2023-02-11 DIAGNOSIS — Z9841 Cataract extraction status, right eye: Secondary | ICD-10-CM

## 2023-02-11 DIAGNOSIS — K863 Pseudocyst of pancreas: Secondary | ICD-10-CM | POA: Diagnosis present

## 2023-02-11 DIAGNOSIS — E785 Hyperlipidemia, unspecified: Secondary | ICD-10-CM | POA: Diagnosis present

## 2023-02-11 DIAGNOSIS — I480 Paroxysmal atrial fibrillation: Secondary | ICD-10-CM | POA: Diagnosis present

## 2023-02-11 DIAGNOSIS — D72828 Other elevated white blood cell count: Secondary | ICD-10-CM | POA: Diagnosis present

## 2023-02-11 DIAGNOSIS — Z87442 Personal history of urinary calculi: Secondary | ICD-10-CM

## 2023-02-11 DIAGNOSIS — I129 Hypertensive chronic kidney disease with stage 1 through stage 4 chronic kidney disease, or unspecified chronic kidney disease: Secondary | ICD-10-CM | POA: Diagnosis present

## 2023-02-11 DIAGNOSIS — K5900 Constipation, unspecified: Secondary | ICD-10-CM | POA: Diagnosis present

## 2023-02-11 DIAGNOSIS — D509 Iron deficiency anemia, unspecified: Secondary | ICD-10-CM | POA: Diagnosis present

## 2023-02-11 DIAGNOSIS — T85598A Other mechanical complication of other gastrointestinal prosthetic devices, implants and grafts, initial encounter: Secondary | ICD-10-CM

## 2023-02-11 DIAGNOSIS — K861 Other chronic pancreatitis: Secondary | ICD-10-CM | POA: Diagnosis present

## 2023-02-11 DIAGNOSIS — Z9842 Cataract extraction status, left eye: Secondary | ICD-10-CM

## 2023-02-11 DIAGNOSIS — R112 Nausea with vomiting, unspecified: Secondary | ICD-10-CM | POA: Diagnosis not present

## 2023-02-11 DIAGNOSIS — E876 Hypokalemia: Secondary | ICD-10-CM | POA: Diagnosis not present

## 2023-02-11 DIAGNOSIS — K858 Other acute pancreatitis without necrosis or infection: Secondary | ICD-10-CM | POA: Diagnosis present

## 2023-02-11 DIAGNOSIS — I252 Old myocardial infarction: Secondary | ICD-10-CM

## 2023-02-11 LAB — COMPREHENSIVE METABOLIC PANEL
ALT: 35 U/L (ref 0–44)
AST: 30 U/L (ref 15–41)
Albumin: 2.8 g/dL — ABNORMAL LOW (ref 3.5–5.0)
Alkaline Phosphatase: 77 U/L (ref 38–126)
Anion gap: 9 (ref 5–15)
BUN: 25 mg/dL — ABNORMAL HIGH (ref 8–23)
CO2: 26 mmol/L (ref 22–32)
Calcium: 8.9 mg/dL (ref 8.9–10.3)
Chloride: 98 mmol/L (ref 98–111)
Creatinine, Ser: 0.92 mg/dL (ref 0.61–1.24)
GFR, Estimated: >60 mL/min (ref >60)
Glucose, Bld: 143 mg/dL — ABNORMAL HIGH (ref 70–99)
Potassium: 4.3 mmol/L (ref 3.5–5.1)
Sodium: 133 mmol/L — ABNORMAL LOW (ref 135–145)
Total Bilirubin: 0.4 mg/dL (ref 0.3–1.2)
Total Protein: 7.5 g/dL (ref 6.5–8.1)

## 2023-02-11 LAB — CBC WITH DIFFERENTIAL/PLATELET
Abs Immature Granulocytes: 0.15 10*3/uL — ABNORMAL HIGH (ref 0.00–0.07)
Basophils Absolute: 0.1 10*3/uL (ref 0.0–0.1)
Basophils Relative: 0 %
Eosinophils Absolute: 0.2 10*3/uL (ref 0.0–0.5)
Eosinophils Relative: 1 %
HCT: 35.8 % — ABNORMAL LOW (ref 39.0–52.0)
Hemoglobin: 11.8 g/dL — ABNORMAL LOW (ref 13.0–17.0)
Immature Granulocytes: 1 %
Lymphocytes Relative: 24 %
Lymphs Abs: 3.8 10*3/uL (ref 0.7–4.0)
MCH: 28.6 pg (ref 26.0–34.0)
MCHC: 33 g/dL (ref 30.0–36.0)
MCV: 86.9 fL (ref 80.0–100.0)
Monocytes Absolute: 0.9 10*3/uL (ref 0.1–1.0)
Monocytes Relative: 6 %
Neutro Abs: 10.6 10*3/uL — ABNORMAL HIGH (ref 1.7–7.7)
Neutrophils Relative %: 68 %
Platelets: 377 10*3/uL (ref 150–400)
RBC: 4.12 MIL/uL — ABNORMAL LOW (ref 4.22–5.81)
RDW: 14.8 % (ref 11.5–15.5)
WBC: 15.7 10*3/uL — ABNORMAL HIGH (ref 4.0–10.5)
nRBC: 0 % (ref 0.0–0.2)

## 2023-02-11 MED ORDER — ONDANSETRON HCL 4 MG/2ML IJ SOLN
4.0000 mg | Freq: Once | INTRAMUSCULAR | Status: AC
  Administered 2023-02-12: 4 mg via INTRAVENOUS
  Filled 2023-02-11: qty 2

## 2023-02-11 MED ORDER — SODIUM CHLORIDE 0.9 % IV BOLUS
1000.0000 mL | Freq: Once | INTRAVENOUS | Status: AC
  Administered 2023-02-12: 1000 mL via INTRAVENOUS

## 2023-02-11 NOTE — Telephone Encounter (Signed)
CorTrak tube out after vomiting  Options are clear liquids over weekend and replace NE tube as outpatient vs observation admit to get tube replaced

## 2023-02-11 NOTE — ED Notes (Signed)
Tried to stick twice with no success.PT does not want a IV in the bend of his arm which is where the better veins are

## 2023-02-11 NOTE — ED Provider Notes (Signed)
[facility] [place] EMERGENCY DEPARTMENT AT [facility] Provider Note  CSN: [id number] Arrival date & time: 02/11/23 1853  Chief Complaint(s) NG Tube Displacement  HPI Jon Bray is a 82 y.o. male with past medical history as below, significant for pancreatitis, CAD, HLD, GERD, PAF who presents to the ED with complaint of displaced NJ tube. Pt had NJ tube placed during recent admission 2/2 severe pancreatitis, he is unable to tolerate more that 1-2 sips of liquid without vomiting. He vomited earlier today when taking his medications and NJ tube became dislodge and vomited up the tube. He has no sig abd pain. No fevers or chills. 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   Basal cell carcinoma of skin    a.) s/p Mohs procedure R/L nasal ala 01/27/2015   CAD (coronary artery disease) 05/12/2013   a.) STEMI --> LHC 05/12/2013: 20% p-mLAD, 20% D2, 40% LPDA, 30% LPAV, 99% dLCx --> PCI placing 4.5 x 20 mm and 2.5 x 12 mm Veriflex BMS to LPAV extending to dLCx. b.) cCTA 12/27/2017: Ca score 1578; 82nd percentile for age/sex match control; CT-FFR: RCA 0.99, pLAD 0.97, mLAD 0.98, dLAD 0.89, pLCx 0.98, mLCx 0.97, dLCx 0.97, PLB 0.91, PDA (most distal segment) 0.80.   Cataract    a.) s/p extraction with IOL placement   CKD (chronic kidney disease)    Diverticulosis    GERD (gastroesophageal reflux disease)    History of kidney stones 2023   HLD (hyperlipidemia)    Left lower lobe pulmonary nodule 08/01/2021   a.) CT 08/01/2021: measured 4 mm; perifissural   Long term current use of anticoagulant    a.) apixaban   NSVT (nonsustained ventricular tachycardia) (HCC) 05/13/2013   a.) 24 beat run while in ICU following PCI for STEMI   PAF (paroxysmal atrial fibrillation) (HCC)  11/16/2021   a.) new onset in ED on 11/16/2021. b.) CHA2DS2-VASc = 4 (age x 2, STEMI/aortic plaque, T2DM). c.) rate controlled without pharmacological intervention; started on apixaban 11/16/2021, however PCP discontinued on 11/18/2021 pending Holter study results.   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   Hemorrhagic pancreatitis 11/30/2022   History of ERCP 11/30/2022   Biliary obstruction 11/30/2022   Moderate protein-calorie malnutrition (HCC) 11/30/2022   Gallstone pancreatitis 11/30/2022   Choledocholithiasis 11/21/2022   Acute gallstone pancreatitis 11/17/2022   PAF (paroxysmal atrial fibrillation) (HCC) 11/16/2021   Type 2 diabetes mellitus with peripheral angiopathy (HCC) 05/15/2018   Hyperlipidemia, mixed 10/28/2015   CAD (coronary artery disease) 05/12/2013   GERD (gastroesophageal reflux disease) 05/12/2013   Home Medication(s) Prior to Admission medications   Medication Sig Start Date End Date Taking? Authorizing Provider  acetaminophen (TYLENOL) 325 MG tablet Place 2 tablets (  650 mg total) into feeding tube every 6 (six) hours as needed for mild pain. 02/08/23   Almon Hercules, MD  Ascorbic Acid (VITAMIN C ER PO) Take 1 tablet by mouth daily.    [provider]  aspirin 81 MG chewable tablet Chew 81 mg by mouth daily.    [provider]  Blood Glucose Monitoring Suppl DEVI 1 each by Does not apply route in the morning, at noon, and at  bedtime. May substitute to any manufacturer covered by patient's insurance. 02/08/23   Almon Hercules, MD  CREON [id number] units CPEP capsule Take 36,000 Units by mouth 3 (three) times daily.    [provider]  Docusate Sodium (DSS) 100 MG CAPS Take 1 capsule by mouth daily.    [provider]  Glucose Blood (BLOOD GLUCOSE TEST STRIPS) STRP 1 each by In Vitro route in the morning, at noon, and at bedtime. May substitute to any manufacturer covered by patient's insurance. 02/08/23 03/13/23  Almon Hercules, MD  Insulin Glargine (BASAGLAR KWIKPEN) 100 UNIT/ML Inject 10 Units into the skin daily. 02/08/23   Almon Hercules, MD  Insulin Pen Needle (PEN NEEDLES 3/16") 31G X 5 MM MISC Use to inject insulin 02/08/23   Almon Hercules, MD  Lancet Device MISC 1 each by Does not apply route in the morning, at noon, and at bedtime. May substitute to any manufacturer covered by patient's insurance. 02/08/23 03/10/23  Almon Hercules, MD  Multiple Vitamin (MULTIVITAMIN) tablet Take 1 tablet by mouth daily.    [provider]  Nutritional Supplements (FEEDING SUPPLEMENT, OSMOLITE 1.5 CAL,) LIQD Place 1,440 mLs into feeding tube daily. 02/08/23 11/05/23  Almon Hercules, MD  ondansetron (ZOFRAN-ODT) 4 MG disintegrating tablet Take 4 mg by mouth every 8 (eight) hours as needed for refractory nausea / vomiting. 01/27/23 04/27/23  [provider]  oxyCODONE (ROXICODONE) 5 MG immediate release tablet Take 1 tablet (5 mg total) by mouth every 8 (eight) hours as needed for up to 7 days for severe pain or moderate pain. 02/08/23 02/15/23  Almon Hercules, MD  pantoprazole (PROTONIX) 40 MG tablet Take 1 tablet (40 mg total) by mouth 2 (two) times daily. 02/08/23 03/10/23  Almon Hercules, MD  polyethylene glycol powder (MIRALAX) 17 GM/SCOOP powder Take 17 g by mouth 2 (two) times daily as needed for moderate constipation or mild constipation. 02/08/23   Almon Hercules, MD  Protein (FEEDING SUPPLEMENT, PROSOURCE TF20,) liquid  Place 60 mLs into feeding tube daily. 02/09/23 11/06/23  Almon Hercules, MD  sodium chloride (MURO 128) 5 % ophthalmic ointment Place 1 Application into both eyes at bedtime.    [provider]  Water For Irrigation, Sterile (FREE WATER) SOLN Place 100 mLs into feeding tube every 3 (three) hours. 02/08/23 11/05/23  Almon Hercules, MD  Zinc Sulfate (ZINC-220 PO) Take 1 tablet by mouth daily.    [provider]  Past Surgical History Past Surgical History:  Procedure Laterality Date   BIOPSY  12/02/2022   Procedure: BIOPSY;  Surgeon: Meridee Score Netty Starring., MD;  Location: [facility] ENDOSCOPY;  Service: Gastroenterology;;   BIOPSY  01/31/2023   Procedure: BIOPSY;  Surgeon: Imogene Burn, MD;  Location: [facility] ENDOSCOPY;  Service: Gastroenterology;;   CATARACT EXTRACTION W/ INTRAOCULAR LENS IMPLANT Bilateral    CHOLECYSTECTOMY N/A 12/03/2022   Procedure: LAPAROSCOPIC CHOLECYSTECTOMY;  Surgeon: [name], MD;  Location: [facility] OR;  Service: General;  Laterality: N/A;   COLONOSCOPY N/A 01/31/2001   COLONOSCOPY N/A 11/19/2008   CORONARY ANGIOPLASTY WITH STENT PLACEMENT Left 05/12/2013   Procedure: CORONARY ANGIOPLASTY WITH STENT PLACEMENT; Location: [facility]; Surgeon: Lieutenant Diego, MD   ENDOSCOPIC RETROGRADE CHOLANGIOPANCREATOGRAPHY (ERCP) WITH PROPOFOL N/A 11/22/2022   Procedure: ENDOSCOPIC RETROGRADE CHOLANGIOPANCREATOGRAPHY (ERCP) WITH PROPOFOL;  Surgeon: Iva Boop, MD;  Location: [facility] ENDOSCOPY;  Service: Gastroenterology;  Laterality: N/A;   ERCP N/A 12/02/2022   Procedure: ENDOSCOPIC RETROGRADE CHOLANGIOPANCREATOGRAPHY (ERCP);  Surgeon: [name]., MD;  Location: [facility] ENDOSCOPY;  Service: Gastroenterology;  Laterality: N/A;   ESOPHAGOGASTRODUODENOSCOPY N/A 03/03/2000   ESOPHAGOGASTRODUODENOSCOPY (EGD) WITH PROPOFOL N/A 10/16/2021   Procedure:  ESOPHAGOGASTRODUODENOSCOPY (EGD) WITH PROPOFOL;  Surgeon: Regis Bill, MD;  Location: [facility] ENDOSCOPY;  Service: Endoscopy;  Laterality: N/A;   ESOPHAGOGASTRODUODENOSCOPY (EGD) WITH PROPOFOL N/A 01/31/2023   Procedure: ESOPHAGOGASTRODUODENOSCOPY (EGD) WITH PROPOFOL;  Surgeon: Imogene Burn, MD;  Location: [facility] ENDOSCOPY;  Service: Gastroenterology;  Laterality: N/A;   EXTRACORPOREAL SHOCK WAVE LITHOTRIPSY Left 08/06/2021   Procedure: EXTRACORPOREAL SHOCK WAVE LITHOTRIPSY (ESWL);  Surgeon: Vanna Scotland, MD;  Location: [facility] ORS;  Service: Urology;  Laterality: Left;   INGUINAL HERNIA REPAIR Right 11/27/2021   Procedure: HERNIA REPAIR INGUINAL ADULT;  Surgeon: Earline Mayotte, MD;  Location: [facility] ORS;  Service: General;  Laterality: Right;   INTRAOCULAR LENS EXCHANGE Left 12/27/2018   Procedure: MECHANICAL VITRECTOMY,  EXCHANGE LENS PROSTHESIS VIA PARS PLANA APPROACH; Location: [facility]; Surgeon: Nicholaus Corolla, MD   MOHS SURGERY Bilateral 01/27/2015   Procedure: MOHS SURGERY TO REMOVE BASAL CELL CARCINOMA FROM RIGHT/LEFT NASAL ALA; Location: [facility]; Surgeon: Katrine Coho, MD   REMOVAL OF STONES  12/02/2022   Procedure: REMOVAL OF STONES;  Surgeon: Meridee Score Netty Starring., MD;  Location: [facility] ENDOSCOPY;  Service: Gastroenterology;;   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 ENDOSCOPY;  Service: Gastroenterology;;   TONSILLECTOMY AND ADENOIDECTOMY N/A    Family History History reviewed. No pertinent family history.  Social  History Social History   Tobacco Use   Smoking status: Never   Smokeless tobacco: Never  Vaping Use   Vaping status: Never Used  Substance Use Topics   Alcohol use: Never   Drug use: Never   Allergies Patient has no known allergies.  Review of Systems Review of Systems  Constitutional:  Negative for chills and fever.  HENT:  Negative for congestion and voice change.   Respiratory:  Negative for chest tightness and shortness of breath.   Cardiovascular:  Negative for chest pain and palpitations.  Gastrointestinal:  Positive for nausea and vomiting. Negative for abdominal pain, blood in  stool and diarrhea.  Genitourinary:  Negative for difficulty urinating and dysuria.  Musculoskeletal:  Negative for arthralgias and back pain.  All other systems reviewed and are negative.   Physical Exam Vital Signs  I have reviewed the triage vital signs BP 137/84 (BP Location: Right Arm)   Pulse 96   Temp 98.2 F (36.8 C) (Oral)   Resp (!) 25   Ht 5\' 8"  (1.727 m)   Wt 66.7 kg   SpO2 100%   BMI 22.35 kg/m  Physical Exam Vitals and nursing note reviewed.  Constitutional:      General: He is not in acute distress.    Appearance: Normal appearance. He is well-developed. He is not ill-appearing.  HENT:     Head: Normocephalic and atraumatic.     Comments: Nj tube noted from mouth/naris    Right Ear: External ear normal.     Left Ear: External ear normal.     Nose: Nose normal.     Mouth/Throat:     Mouth: Mucous membranes are moist.  Eyes:     General: No scleral icterus.       Right eye: No discharge.        Left eye: No discharge.  Cardiovascular:     Rate and Rhythm: Normal rate and regular rhythm.     Pulses: Normal pulses.     Heart sounds: Normal heart sounds.  Pulmonary:     Effort: Pulmonary effort is normal. No respiratory distress.     Breath sounds: Normal breath sounds. No stridor.  Abdominal:     General: Abdomen is flat. There is no distension.      Tenderness: There is no abdominal tenderness. There is no guarding or rebound.  Musculoskeletal:        General: No deformity.     Cervical back: No rigidity.  Skin:    General: Skin is warm and dry.     Coloration: Skin is not cyanotic, jaundiced or pale.  Neurological:     Mental Status: He is alert and oriented to person, place, and time.     GCS: GCS eye subscore is 4. GCS verbal subscore is 5. GCS motor subscore is 6.  Psychiatric:        Speech: Speech normal.        Behavior: Behavior normal. Behavior is cooperative.     ED Results and Treatments Labs (all labs ordered are listed, but only abnormal results are displayed) Labs Reviewed  CBC WITH DIFFERENTIAL/PLATELET - Abnormal; Notable for the following components:      Result Value   WBC 15.7 (*)    RBC 4.12 (*)    Hemoglobin 11.8 (*)    HCT 35.8 (*)    Neutro Abs 10.6 (*)    Abs Immature Granulocytes 0.15 (*)    All other components within normal limits  COMPREHENSIVE METABOLIC PANEL - Abnormal; Notable for the following components:   Sodium 133 (*)    Glucose, Bld 143 (*)    BUN 25 (*)    Albumin 2.8 (*)    All other components within normal limits  Radiology No results found.  Pertinent labs & imaging results that were available during my care of the patient were reviewed by me and considered in my medical decision making (see MDM for details).  Medications Ordered in ED Medications  sodium chloride 0.9 % bolus 1,000 mL (has no administration in time range)  ondansetron (ZOFRAN) injection 4 mg (has no administration in time range)                                                                                                                                     Procedures Procedures  (including critical care time)  Medical Decision Making / ED Course    Medical Decision Making:     Jon Bray is a 82 y.o. male hx pancreatitis and other as above here with vomiting/displaced NJ tube. The complaint involves an extensive differential diagnosis and also carries with it a high risk of complications and morbidity.  Serious etiology was considered.   Complete initial physical exam performed, notably the patient  was NAD, hds, abd not peritoneal .    Reviewed and confirmed nursing documentation for past medical history, family history, social history.  Vital signs reviewed.    Clinical Course as of 02/11/23 2325  Fri Feb 11, 2023  2212 WBC(!): 15.7 Similar to prior  [SG]    Clinical Course User Index [SG] Sloan Leiter, DO    Narrative: 82 yo male with hx severe pancreatitis requiring NJ tube placement Became dislodged today following emesis Unable to tolerate more than 1-2 sips of liquid every 4 hours or so NJ was removed by myself as coming out of pt's mouth, all pieces were removed and appear intact Spoke with GI Dr Elenore Paddy, if pt can tolerate clears would be reasonable to dc home and f/u Monday, if not plan obs Cortrak not avail today for replacement Will get screening labs Plan for observation to facilitate rehydration, hopefully placement of NJ tomorrow   Dr Janalyn Shy accepting               Additional history obtained: -Additional history obtained from family -External records from outside source obtained and reviewed including: Chart review including previous notes, labs, imaging, consultation notes including  Recent hospitalization Home meds Prior labs/imaging    Lab Tests: -I ordered, reviewed, and interpreted labs.   The pertinent results include:   Labs Reviewed  CBC WITH DIFFERENTIAL/PLATELET - Abnormal; Notable for the following components:      Result Value   WBC 15.7 (*)    RBC 4.12 (*)    Hemoglobin 11.8 (*)    HCT 35.8 (*)    Neutro Abs 10.6 (*)    Abs Immature Granulocytes 0.15 (*)    All other components within normal  limits  COMPREHENSIVE METABOLIC PANEL - Abnormal; Notable for the following components:   Sodium 133 (*)    Glucose, Bld 143 (*)  BUN 25 (*)    Albumin 2.8 (*)    All other components within normal limits    Notable for stable  EKG   EKG Interpretation Date/Time:    Ventricular Rate:    PR Interval:    QRS Duration:    QT Interval:    QTC Calculation:   R Axis:      Text Interpretation:           Imaging Studies ordered: na   Medicines ordered and prescription drug management: Meds ordered this encounter  Medications   sodium chloride 0.9 % bolus 1,000 mL   ondansetron (ZOFRAN) injection 4 mg    -I have reviewed the patients home medicines and have made adjustments as needed   Consultations Obtained: I requested consultation with the Dr Elenore Paddy GI,  and discussed lab and imaging findings as well as pertinent plan - they recommend: as above   Cardiac Monitoring: Continuous pulse oximetry interpreted by myself, 99-100% on RA.     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   Basal cell carcinoma of skin    a.) s/p Mohs procedure R/L nasal ala 01/27/2015   CAD (coronary artery disease) 05/12/2013   a.) STEMI --> LHC 05/12/2013: 20% p-mLAD, 20% D2, 40% LPDA, 30% LPAV, 99% dLCx --> PCI placing 4.5 x 20 mm and 2.5 x 12 mm Veriflex BMS to LPAV extending to dLCx. b.) cCTA 12/27/2017: Ca score 1578; 82nd percentile for age/sex match control; CT-FFR: RCA 0.99, pLAD 0.97, mLAD 0.98, dLAD 0.89, pLCx 0.98, mLCx 0.97, dLCx 0.97, PLB 0.91, PDA (most  distal segment) 0.80.   Cataract    a.) s/p extraction with IOL placement   CKD (chronic kidney disease)    Diverticulosis    GERD (gastroesophageal reflux disease)    History of kidney stones 2023   HLD (hyperlipidemia)    Left lower lobe pulmonary nodule 08/01/2021   a.) CT 08/01/2021: measured 4 mm; perifissural   Long term current use of anticoagulant    a.) apixaban   NSVT (nonsustained ventricular tachycardia) (HCC) 05/13/2013   a.) 24 beat run while in ICU following PCI for STEMI   PAF (paroxysmal atrial fibrillation) (HCC) 11/16/2021   a.) new onset in ED on 11/16/2021. b.) CHA2DS2-VASc = 4 (age x 2, STEMI/aortic plaque, T2DM). c.) rate controlled without pharmacological intervention; started on apixaban 11/16/2021, however PCP discontinued on 11/18/2021 pending Holter study results.   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   Type 2 diabetes, diet controlled (HCC)       Dispostion: Disposition decision including need for hospitalization was considered, and patient admitted to the hospital.    Final Clinical Impression(s) / ED Diagnoses Final diagnoses:  Intractable nausea and vomiting  Chronic pancreatitis, unspecified pancreatitis type (HCC)        Sloan Leiter,  DO 02/11/23 2325

## 2023-02-11 NOTE — ED Notes (Signed)
Dr. Wallace Cullens to remove tube from nose and mouth. Tube was Coretrak 10 Fr 55' - 140cm

## 2023-02-11 NOTE — ED Triage Notes (Signed)
Pt presents with an NG tube that is supposed to go to the small intestines. Pt vomited when attempting to take medication this evening and the tube became dislodged and came out of his mouth.

## 2023-02-11 NOTE — H&P (Incomplete)
History and Physical    [name] YNW:[id number] DOB: 01-22-1941 DOA: 02/11/2023  PCP: Danella Penton, MD   Patient coming from: Home   Chief Complaint:  Chief Complaint  Patient presents with  . NG Tube Displacement    HPI:  Jon Bray is a 82 y.o. male with medical history significant of esophageal stenosis s/p Cortrack NG-tube placed on non-insulin-dependent DM type II, essential hypertension, CAD, paroxysmal atrial fibrillation-not on anticoagulation, gallstone pancreatitis status post cholecystectomy May 2024, chronic anemia, chronic hyponatremia and moderate malnutrition presented to emergency department due to displaced NG tube.  In the ED patient was unable to tolerate more than 1-2 sips of liquid without vomiting.  However he reported vomiting earlier today while taking his medication and for that NG tube has been dislodged.  Patient denies any abdominal pain.  Denies any fever and chills.  Patient had recent hospital admission 01/28/2023 to 02/08/2023 due to AKI, dehydration, acute pancreatitis.  Patient underwent EGD 7/29 showed esophageal stenosis, hiatal hernia and ulcerated gastric fundus with congested mucosa.  As patient did not tolerated oral diet well feeding tube Cortrack placed on 7/31 also continue clear liquid diet as per patient tolerance.  Patient also found to have gallstone pancreatitis status post cholecystectomy during that admission.  Patient was discharged home with [facility] and supposed to be follow-up with  GI on 02/22/2023.  Patient has been evaluated by [facility] for displacement of an EGD.  Recommended clear liquid diet over the weekend and replace NG tube outpatient versus observation to admit to get tube placed.  ED Course:  At ED initial presentation patient is slightly tachycardic 101, respiratory 20, blood pressure 120/60 and O2 sat 97% room air.  Labs showed WBC 5.7 (he was discharged 4 days ago from the hospital during that time WBC count 15.3), RBC  4.2, hemoglobin 11, hematocrit 35 and 377.  CMP showed sodium 133, potassium 4.3, chloride 98, bicarb 26, blood glucose 143, BUN 25, creatinine 0.92, calcium 8.3, low albumin 2.8, normal AST/ALT, normal ALP, bilirubin 0.4 and GFR above 60.  Hospitalist has been consulted for the management of displaced NG tube.  Review of Systems:  ROS  Past Medical History:  Diagnosis Date  . Anginal pain (HCC)   . Aortic atherosclerosis (HCC)   . 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  . Basal cell carcinoma of skin    a.) s/p Mohs procedure R/L nasal ala 01/27/2015  . CAD (coronary artery disease) 05/12/2013   a.) STEMI --> LHC 05/12/2013: 20% p-mLAD, 20% D2, 40% LPDA, 30% LPAV, 99% dLCx --> PCI placing 4.5 x 20 mm and 2.5 x 12 mm Veriflex BMS to LPAV extending to dLCx. b.) cCTA 12/27/2017: Ca score 1578; 82nd percentile for age/sex match control; CT-FFR: RCA 0.99, pLAD 0.97, mLAD 0.98, dLAD 0.89, pLCx 0.98, mLCx 0.97, dLCx 0.97, PLB 0.91, PDA (most distal segment) 0.80.  [name] Cataract    a.) s/p extraction with IOL placement  . CKD (chronic kidney disease)   . Diverticulosis   . GERD (gastroesophageal reflux disease)   . History of kidney stones 2023  . HLD (hyperlipidemia)   . Left lower lobe pulmonary nodule 08/01/2021   a.) CT 08/01/2021: measured 4 mm; perifissural  . Long term current use of anticoagulant    a.) apixaban  . NSVT (nonsustained ventricular tachycardia) (HCC) 05/13/2013   a.) 24 beat run while in ICU following PCI for STEMI  . PAF (  paroxysmal atrial fibrillation) (HCC) 11/16/2021   a.) new onset in ED on 11/16/2021. b.) CHA2DS2-VASc = 4 (age x 2, STEMI/aortic plaque, T2DM). c.) rate controlled without pharmacological intervention; started on apixaban 11/16/2021, however PCP discontinued on 11/18/2021 pending Holter study results.  . Right inguinal hernia   . 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  . Type 2 diabetes, diet controlled (HCC)     Past Surgical History:  Procedure Laterality Date  . BIOPSY  12/02/2022   Procedure: BIOPSY;  Surgeon: [name]., MD;  Location: [facility] ENDOSCOPY;  Service: Gastroenterology;;  . BIOPSY  01/31/2023   Procedure: BIOPSY;  Surgeon: Imogene Burn, MD;  Location: [facility] ENDOSCOPY;  Service: Gastroenterology;;  . CATARACT EXTRACTION W/ INTRAOCULAR LENS IMPLANT Bilateral   . CHOLECYSTECTOMY N/A 12/03/2022   Procedure: LAPAROSCOPIC CHOLECYSTECTOMY;  Surgeon: [name], MD;  Location: [facility] OR;  Service: General;  Laterality: N/A;  . COLONOSCOPY N/A 01/31/2001  . COLONOSCOPY N/A 11/19/2008  . CORONARY ANGIOPLASTY WITH STENT PLACEMENT Left 05/12/2013   Procedure: CORONARY ANGIOPLASTY WITH STENT PLACEMENT; Location: [facility]; Surgeon: Lieutenant Diego, MD  . ENDOSCOPIC RETROGRADE CHOLANGIOPANCREATOGRAPHY (ERCP) WITH PROPOFOL N/A 11/22/2022   Procedure: ENDOSCOPIC RETROGRADE CHOLANGIOPANCREATOGRAPHY (ERCP) WITH PROPOFOL;  Surgeon: Iva Boop, MD;  Location: [facility] ENDOSCOPY;  Service: Gastroenterology;  Laterality: N/A;  . ERCP N/A 12/02/2022   Procedure: ENDOSCOPIC RETROGRADE CHOLANGIOPANCREATOGRAPHY (ERCP);  Surgeon: [name]., MD;  Location: [facility] ENDOSCOPY;  Service: Gastroenterology;  Laterality: N/A;  . ESOPHAGOGASTRODUODENOSCOPY N/A 03/03/2000  . ESOPHAGOGASTRODUODENOSCOPY (EGD) WITH PROPOFOL N/A 10/16/2021   Procedure: ESOPHAGOGASTRODUODENOSCOPY (EGD) WITH PROPOFOL;  Surgeon: Regis Bill, MD;  Location: [facility] ENDOSCOPY;  Service: Endoscopy;  Laterality: N/A;  . ESOPHAGOGASTRODUODENOSCOPY (EGD) WITH PROPOFOL N/A 01/31/2023   Procedure: ESOPHAGOGASTRODUODENOSCOPY (EGD) WITH PROPOFOL;  Surgeon: Imogene Burn, MD;  Location: [facility] ENDOSCOPY;  Service: Gastroenterology;   Laterality: N/A;  . EXTRACORPOREAL SHOCK WAVE LITHOTRIPSY Left 08/06/2021   Procedure: EXTRACORPOREAL SHOCK WAVE LITHOTRIPSY (ESWL);  Surgeon: Vanna Scotland, MD;  Location: [facility] ORS;  Service: Urology;  Laterality: Left;  . INGUINAL HERNIA REPAIR Right 11/27/2021   Procedure: HERNIA REPAIR INGUINAL ADULT;  Surgeon: Earline Mayotte, MD;  Location: [facility] ORS;  Service: General;  Laterality: Right;  . INTRAOCULAR LENS EXCHANGE Left 12/27/2018   Procedure: MECHANICAL VITRECTOMY,  EXCHANGE LENS PROSTHESIS VIA PARS PLANA APPROACH; Location: [facility]; Surgeon: Nicholaus Corolla, MD  . MOHS SURGERY Bilateral 01/27/2015   Procedure: MOHS SURGERY TO REMOVE BASAL CELL CARCINOMA FROM RIGHT/LEFT NASAL ALA; Location: [facility]; Surgeon: Katrine Coho, MD  . REMOVAL OF STONES  12/02/2022   Procedure: REMOVAL OF STONES;  Surgeon: [name]., MD;  Location: [facility] ENDOSCOPY;  Service: Gastroenterology;;  . 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   Procedure: RPR RETINAL DTCHMNT W/VITRECTOMY ANY METH REPAIR RETINAL DETACHMENT; WITH VITRECTOMY, INC, WHEN PERFORMED, AIR/GAS TAMPONADE, FOCAL ENDOLASER PHOTOCOAGULATION, CRYOTHERAPY, DRAINAGE OF SUBRETINAL FLUID, SCLERAL BUCKLING AND/OR REMOVAL OF LENS; Location: [facility]; Surgeon: Nicholaus Corolla, MD  . SINUSOTOMY N/A    Procedure: MAXILLARY SINUSOTOMY INTRANASAL  . SPHINCTEROTOMY  12/02/2022   Procedure: SPHINCTEROTOMY;  Surgeon: Mansouraty, Netty Starring., MD;  Location: [facility] ENDOSCOPY;  Service: Gastroenterology;;  . TONSILLECTOMY AND ADENOIDECTOMY N/A      reports that he has never smoked. He has never used smokeless tobacco.  He reports that he does not drink alcohol and does not use drugs.  No Known Allergies  History reviewed. No pertinent family history.  Prior to Admission medications   Medication Sig Start Date End Date Taking? Authorizing Provider  acetaminophen (TYLENOL) 325 MG tablet Place 2 tablets (650 mg total) into feeding tube every 6 (six) hours as needed for  mild pain. 02/08/23   Almon Hercules, MD  Ascorbic Acid (VITAMIN C ER PO) Take 1 tablet by mouth daily.    [provider]  aspirin 81 MG chewable tablet Chew 81 mg by mouth daily.    [provider]  Blood Glucose Monitoring Suppl DEVI 1 each by Does not apply route in the morning, at noon, and at bedtime. May substitute to any manufacturer covered by patient's insurance. 02/08/23   Almon Hercules, MD  CREON [id number] units CPEP capsule Take 36,000 Units by mouth 3 (three) times daily.    [provider]  Docusate Sodium (DSS) 100 MG CAPS Take 1 capsule by mouth daily.    [provider]  Glucose Blood (BLOOD GLUCOSE TEST STRIPS) STRP 1 each by In Vitro route in the morning, at noon, and at bedtime. May substitute to any manufacturer covered by patient's insurance. 02/08/23 03/13/23  Almon Hercules, MD  Insulin Glargine (BASAGLAR KWIKPEN) 100 UNIT/ML Inject 10 Units into the skin daily. 02/08/23   Almon Hercules, MD  Insulin Pen Needle (PEN NEEDLES 3/16") 31G X 5 MM MISC Use to inject insulin 02/08/23   Almon Hercules, MD  Lancet Device MISC 1 each by Does not apply route in the morning, at noon, and at bedtime. May substitute to any manufacturer covered by patient's insurance. 02/08/23 03/10/23  Almon Hercules, MD  Multiple Vitamin (MULTIVITAMIN) tablet Take 1 tablet by mouth daily.    [provider]  Nutritional Supplements (FEEDING SUPPLEMENT, OSMOLITE 1.5 CAL,) LIQD Place 1,440 mLs into feeding tube daily. 02/08/23 11/05/23  Almon Hercules, MD  ondansetron (ZOFRAN-ODT) 4 MG disintegrating tablet Take 4 mg by mouth every 8 (eight) hours as needed for refractory nausea / vomiting. 01/27/23 04/27/23  [provider]  oxyCODONE (ROXICODONE) 5 MG immediate release tablet Take 1 tablet (5 mg total) by mouth every 8 (eight) hours as needed for up to 7 days for severe pain or moderate pain. 02/08/23 02/15/23  Almon Hercules, MD  pantoprazole (PROTONIX) 40 MG tablet Take 1 tablet  (40 mg total) by mouth 2 (two) times daily. 02/08/23 03/10/23  Almon Hercules, MD  polyethylene glycol powder (MIRALAX) 17 GM/SCOOP powder Take 17 g by mouth 2 (two) times daily as needed for moderate constipation or mild constipation. 02/08/23   Almon Hercules, MD  Protein (FEEDING SUPPLEMENT, PROSOURCE TF20,) liquid Place 60 mLs into feeding tube daily. 02/09/23 11/06/23  Almon Hercules, MD  sodium chloride (MURO 128) 5 % ophthalmic ointment Place 1 Application into both eyes at bedtime.    [provider]  Water For Irrigation, Sterile (FREE WATER) SOLN Place 100 mLs into feeding tube every 3 (three) hours. 02/08/23 11/05/23  Almon Hercules, MD  Zinc Sulfate (ZINC-220 PO) Take 1 tablet by mouth daily.    [provider]     Physical Exam: Vitals:   02/11/23 1926 02/11/23 1936 02/11/23 1939 02/11/23 2208  BP: 120/69 110/66  137/84  Pulse: (!) 101 (!) 108  96  Resp: 20 18  (!) 25  Temp: 97.7 F (36.5 C)  98.4 F (36.9 C)  98.2 F (36.8 C)  TempSrc:  Oral  Oral  SpO2: 97% 98%  100%  Weight:   66.7 kg   Height:   5\' 8"  (1.727 m)     Physical Exam   Labs on Admission: I have personally reviewed following labs and imaging studies  CBC: Recent Labs  Lab 02/05/23 0116 02/06/23 0129 02/07/23 0840 02/07/23 2359 02/11/23 2151  WBC 13.7* 15.3* 14.9* 15.3* 15.7*  NEUTROABS  --  10.6* 10.4*  --  10.6*  HGB 11.7* 11.3* 11.0* 10.8* 11.8*  HCT 36.1* 33.5* 33.6* 33.0* 35.8*  MCV 84.1 85.7 84.4 86.4 86.9  PLT 303 279 280 278 377   Basic Metabolic Panel: Recent Labs  Lab 02/05/23 0116 02/06/23 0129 02/07/23 0840 02/07/23 2359 02/11/23 2151  NA 133* 131* 130* 132* 133*  K 4.0 4.2 4.6 4.2 4.3  CL 102 99 98 100 98  CO2 22 22 19* 22 26  GLUCOSE 174* 216* 195* 278* 143*  BUN 15 20 20 23  25*  CREATININE 0.80 0.80 0.78 0.95 0.92  CALCIUM 8.4* 8.1* 8.1* 8.2* 8.9  MG 2.0 2.0 2.0 1.9  --   PHOS 3.5 3.1 4.0 3.3  --    GFR: Estimated Creatinine Clearance: 57.4 mL/min (by C-G formula  based on SCr of 0.92 mg/dL). Liver Function Tests: Recent Labs  Lab 02/05/23 0116 02/06/23 0129 02/07/23 0840 02/07/23 2359 02/11/23 2151  AST 23  --  31  --  30  ALT 30  --  26  --  35  ALKPHOS 73  --  68  --  77  BILITOT 0.7  --  0.8  --  0.4  PROT 6.5  --  6.5  --  7.5  ALBUMIN 2.4* 2.3* 2.3* 2.3* 2.8*   No results for input(s): "LIPASE", "AMYLASE" in the last 168 hours. No results for input(s): "AMMONIA" in the last 168 hours. Coagulation Profile: No results for input(s): "INR", "PROTIME" in the last 168 hours. Cardiac Enzymes: No results for input(s): "CKTOTAL", "CKMB", "CKMBINDEX", "TROPONINI", "TROPONINIHS" in the last 168 hours. BNP (last 3 results) No results for input(s): "BNP" in the last 8760 hours. HbA1C: No results for input(s): "HGBA1C" in the last 72 hours. CBG: Recent Labs  Lab 02/07/23 0032 02/07/23 0444 02/07/23 0828 02/07/23 2128 02/08/23 0742  GLUCAP 179* 169* 175* 168* 179*   Lipid Profile: No results for input(s): "CHOL", "HDL", "LDLCALC", "TRIG", "CHOLHDL", "LDLDIRECT" in the last 72 hours. Thyroid Function Tests: No results for input(s): "TSH", "T4TOTAL", "FREET4", "T3FREE", "THYROIDAB" in the last 72 hours. Anemia Panel: No results for input(s): "VITAMINB12", "FOLATE", "FERRITIN", "TIBC", "IRON", "RETICCTPCT" in the last 72 hours. Urine analysis:    Component Value Date/Time   COLORURINE AMBER (A) 01/28/2023 1312   APPEARANCEUR HAZY (A) 01/28/2023 1312   APPEARANCEUR Clear 08/28/2021 1113   LABSPEC 1.031 (H) 01/28/2023 1312   PHURINE 5.0 01/28/2023 1312   GLUCOSEU NEGATIVE 01/28/2023 1312   HGBUR NEGATIVE 01/28/2023 1312   BILIRUBINUR NEGATIVE 01/28/2023 1312   BILIRUBINUR Negative 08/28/2021 1113   KETONESUR NEGATIVE 01/28/2023 1312   PROTEINUR 100 (A) 01/28/2023 1312   NITRITE NEGATIVE 01/28/2023 1312   LEUKOCYTESUR NEGATIVE 01/28/2023 1312    Radiological Exams on Admission: I have personally reviewed images No results  found.  EKG: My personal interpretation of EKG shows: ***    Assessment/Plan: Active Problems:   Displaced nasogastric feeding tube   Esophageal stenosis   Chronic pancreatitis (HCC)   Diabetes type  2, controlled (HCC)   Leukocytosis   Essential hypertension   History of CAD (coronary artery disease)   Atrial fibrillation, chronic -not on coagulation (HCC)   Chronic disease anemia    Assessment and Plan: No notes have been filed under this hospital service. Service: Hospitalist      DVT prophylaxis:  {Blank single:19197::"Lovenox","SQ Heparin","IV heparin gtts","Xarelto","Eliquis","Coumadin","SCDs","***"} Code Status:  {Blank single:19197::"Full Code","DNR with Intubation","DNR/DNI(Do NOT Intubate)","Comfort Care","***"} Diet:  Family Communication:  ***  Disposition Plan:  ***  Consults:  ***  Admission status:   {Blank single:19197::"Observation","Inpatient"}, {Blank single:19197::"Med-Surg","Telemetry bed","Step Down Unit"}  Severity of Illness: {Observation/Inpatient:21159}    Tereasa Coop, MD [facility]  How to contact the [facility] Attending or Consulting provider 7A - 7P or covering provider during after hours 7P -7A, for this patient.  Check the care team in [facility] and look for a) attending/consulting [facility] provider listed and b) the [facility] team listed Log into [email or web address] and use [facility]'s universal password to access. If you do not have the password, please contact the hospital operator. Locate the [facility] provider you are looking for under [facility] and page to a number that you can be directly reached. If you still have difficulty reaching the provider, please page the [facility] (Director on Call) for the Hospitalists listed on amion for assistance.  02/12/2023, 12:04 AM

## 2023-02-11 NOTE — H&P (Incomplete)
History and Physical    Jon Bray:[id number] DOB: Aug 21, 1940 DOA: 02/11/2023  PCP: Danella Penton, MD   Patient coming from: Home   Chief Complaint:  Chief Complaint  Patient presents with   NG Tube Displacement    HPI:  Jon Bray is a 82 y.o. male with medical history significant of esophageal stenosis s/p Cortrack NG-tube placed on 02/02/2023, insulin-dependent DM type II, essential hypertension, CAD, paroxysmal atrial fibrillation-not on anticoagulation, gallstone pancreatitis status post cholecystectomy May 2024, chronic anemia, chronic hyponatremia and moderate malnutrition presented to emergency department due to displaced NG tube.  In the ED patient was unable to tolerate more than 1-2 sips of liquid without vomiting.  However he reported vomiting earlier today while taking his medication and for that NG tube has been dislodged.  Patient denies any abdominal pain.  Denies any fever and chills.  Patient had recent hospital admission 01/28/2023 to 02/08/2023 due to AKI, dehydration, acute pancreatitis.  Patient underwent EGD 7/29 showed esophageal stenosis, hiatal hernia and ulcerated gastric fundus with congested mucosa.  As patient did not tolerated oral diet well feeding tube Cortrack placed on 7/31 also continue clear liquid diet as per patient tolerance. Patient was discharged home with [facility] and supposed to be follow-up with  GI on 02/22/2023.  Patient has been evaluated by [facility] for displacement of an EGD.  Recommended clear liquid diet over the weekend and replace NG tube outpatient versus observation to admit to get tube placed.  ED Course:  At ED initial presentation patient is slightly tachycardic 101, respiratory 20, blood pressure 120/60 and O2 sat 97% room air.  Labs showed WBC 5.7 (he was discharged 4 days ago from the hospital during that time WBC count 15.3), RBC 4.2, hemoglobin 11, hematocrit 35 and 377.  CMP showed sodium 133, potassium 4.3, chloride 98,  bicarb 26, blood glucose 143, BUN 25, creatinine 0.92, calcium 8.3, low albumin 2.8, normal AST/ALT, normal ALP, bilirubin 0.4 and GFR above 60.  Hospitalist has been consulted for the management of displaced NG tube.  Review of Systems:  Review of Systems  Constitutional:  Negative for chills, fever, malaise/fatigue and weight loss.  Respiratory:  Negative for cough and shortness of breath.   Cardiovascular:  Negative for chest pain, palpitations, leg swelling and PND.  Gastrointestinal:  Positive for nausea and vomiting. Negative for abdominal pain, constipation, diarrhea and heartburn.  Genitourinary:  Negative for dysuria and urgency.  Musculoskeletal:  Negative for back pain, myalgias and neck pain.  Skin:  Negative for rash.  Neurological:  Negative for dizziness and headaches.  Psychiatric/Behavioral:  The patient is not nervous/anxious.     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   Basal cell carcinoma of skin    a.) s/p Mohs procedure R/L nasal ala 01/27/2015   CAD (coronary artery disease) 05/12/2013   a.) STEMI --> LHC 05/12/2013: 20% p-mLAD, 20% D2, 40% LPDA, 30% LPAV, 99% dLCx --> PCI placing 4.5 x 20 mm and 2.5 x 12 mm Veriflex BMS to LPAV extending to dLCx. b.) cCTA 12/27/2017: Ca score 1578; 82nd percentile for age/sex match control; CT-FFR: RCA 0.99, pLAD 0.97, mLAD 0.98, dLAD 0.89, pLCx 0.98, mLCx 0.97, dLCx 0.97, PLB 0.91, PDA (most distal segment) 0.80.   Cataract    a.) s/p extraction with IOL placement  CKD (chronic kidney disease)    Diverticulosis    GERD (gastroesophageal reflux disease)    History of kidney stones 2023   HLD (hyperlipidemia)    Left lower lobe pulmonary nodule 08/01/2021   a.) CT 08/01/2021: measured 4 mm; perifissural   Long term current use of anticoagulant    a.) apixaban   NSVT (nonsustained  ventricular tachycardia) (HCC) 05/13/2013   a.) 24 beat run while in ICU following PCI for STEMI   PAF (paroxysmal atrial fibrillation) (HCC) 11/16/2021   a.) new onset in ED on 11/16/2021. b.) CHA2DS2-VASc = 4 (age x 2, STEMI/aortic plaque, T2DM). c.) rate controlled without pharmacological intervention; started on apixaban 11/16/2021, however PCP discontinued on 11/18/2021 pending Holter study results.   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   Type 2 diabetes, diet controlled [facility])     Past Surgical History:  Procedure Laterality Date   BIOPSY  12/02/2022   Procedure: BIOPSY;  Surgeon: Meridee Score Netty Starring., MD;  Location: [facility] ENDOSCOPY;  Service: Gastroenterology;;   BIOPSY  01/31/2023   Procedure: BIOPSY;  Surgeon: Imogene Burn, MD;  Location: [facility] ENDOSCOPY;  Service: Gastroenterology;;   CATARACT EXTRACTION W/ INTRAOCULAR LENS IMPLANT Bilateral    CHOLECYSTECTOMY N/A 12/03/2022   Procedure: LAPAROSCOPIC CHOLECYSTECTOMY;  Surgeon: [name], MD;  Location: [facility] OR;  Service: General;  Laterality: N/A;   COLONOSCOPY N/A 01/31/2001   COLONOSCOPY N/A 11/19/2008   CORONARY ANGIOPLASTY WITH STENT PLACEMENT Left 05/12/2013   Procedure: CORONARY ANGIOPLASTY WITH STENT PLACEMENT; Location: [facility]; Surgeon: Lieutenant Diego, MD   ENDOSCOPIC RETROGRADE CHOLANGIOPANCREATOGRAPHY (ERCP) WITH PROPOFOL N/A 11/22/2022   Procedure: ENDOSCOPIC RETROGRADE CHOLANGIOPANCREATOGRAPHY (ERCP) WITH PROPOFOL;  Surgeon: Iva Boop, MD;  Location: [facility] ENDOSCOPY;  Service: Gastroenterology;  Laterality: N/A;   ERCP N/A 12/02/2022   Procedure: ENDOSCOPIC RETROGRADE CHOLANGIOPANCREATOGRAPHY (ERCP);  Surgeon: [name]., MD;  Location: [facility] ENDOSCOPY;  Service: Gastroenterology;   Laterality: N/A;   ESOPHAGOGASTRODUODENOSCOPY N/A 03/03/2000   ESOPHAGOGASTRODUODENOSCOPY (EGD) WITH PROPOFOL N/A 10/16/2021   Procedure: ESOPHAGOGASTRODUODENOSCOPY (EGD) WITH PROPOFOL;  Surgeon: Regis Bill, MD;  Location: [facility] ENDOSCOPY;  Service: Endoscopy;  Laterality: N/A;   ESOPHAGOGASTRODUODENOSCOPY (EGD) WITH PROPOFOL N/A 01/31/2023   Procedure: ESOPHAGOGASTRODUODENOSCOPY (EGD) WITH PROPOFOL;  Surgeon: Imogene Burn, MD;  Location: [facility] ENDOSCOPY;  Service: Gastroenterology;  Laterality: N/A;   EXTRACORPOREAL SHOCK WAVE LITHOTRIPSY Left 08/06/2021   Procedure: EXTRACORPOREAL SHOCK WAVE LITHOTRIPSY (ESWL);  Surgeon: Vanna Scotland, MD;  Location: [facility] ORS;  Service: Urology;  Laterality: Left;   INGUINAL HERNIA REPAIR Right 11/27/2021   Procedure: HERNIA REPAIR INGUINAL ADULT;  Surgeon: Earline Mayotte, MD;  Location: [facility] ORS;  Service: General;  Laterality: Right;   INTRAOCULAR LENS EXCHANGE Left 12/27/2018   Procedure: MECHANICAL VITRECTOMY,  EXCHANGE LENS PROSTHESIS VIA PARS PLANA APPROACH; Location: [facility]; Surgeon: Nicholaus Corolla, MD   MOHS SURGERY Bilateral 01/27/2015   Procedure: MOHS SURGERY TO REMOVE BASAL CELL CARCINOMA FROM RIGHT/LEFT NASAL ALA; Location: [facility]; Surgeon: Katrine Coho, MD   REMOVAL OF STONES  12/02/2022   Procedure: REMOVAL OF STONES;  Surgeon: [name]., MD;  Location: [facility] ENDOSCOPY;  Service: Gastroenterology;;   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   Procedure: RPR RETINAL DTCHMNT W/VITRECTOMY ANY METH REPAIR RETINAL DETACHMENT; WITH  VITRECTOMY, INC, WHEN PERFORMED, AIR/GAS TAMPONADE, FOCAL ENDOLASER PHOTOCOAGULATION, CRYOTHERAPY, DRAINAGE OF SUBRETINAL FLUID, SCLERAL BUCKLING AND/OR REMOVAL OF LENS; Location: [facility]; Surgeon: Nicholaus Corolla, MD   SINUSOTOMY N/A    Procedure: MAXILLARY SINUSOTOMY INTRANASAL   SPHINCTEROTOMY  12/02/2022   Procedure: SPHINCTEROTOMY;  Surgeon: Mansouraty, Netty Starring., MD;  Location: [facility] ENDOSCOPY;  Service:  Gastroenterology;;   TONSILLECTOMY AND ADENOIDECTOMY N/A      reports that he has never smoked. He has never used smokeless tobacco. He reports that he does not drink alcohol and does not use drugs.  No Known Allergies  History reviewed. No pertinent family history.  Prior to Admission medications   Medication Sig Start Date End Date Taking? Authorizing Provider  acetaminophen (TYLENOL) 325 MG tablet Place 2 tablets (650 mg total) into feeding tube every 6 (six) hours as needed for mild pain. 02/08/23   Almon Hercules, MD  Ascorbic Acid (VITAMIN C ER PO) Take 1 tablet by mouth daily.    [provider]  aspirin 81 MG chewable tablet Chew 81 mg by mouth daily.    [provider]  Blood Glucose Monitoring Suppl DEVI 1 each by Does not apply route in the morning, at noon, and at bedtime. May substitute to any manufacturer covered by patient's insurance. 02/08/23   Almon Hercules, MD  CREON [id number] units CPEP capsule Take 36,000 Units by mouth 3 (three) times daily.    [provider]  Docusate Sodium (DSS) 100 MG CAPS Take 1 capsule by mouth daily.    [provider]  Glucose Blood (BLOOD GLUCOSE TEST STRIPS) STRP 1 each by In Vitro route in the morning, at noon, and at bedtime. May substitute to any manufacturer covered by patient's insurance. 02/08/23 03/13/23  Almon Hercules, MD  Insulin Glargine (BASAGLAR KWIKPEN) 100 UNIT/ML Inject 10 Units into the skin daily. 02/08/23   Almon Hercules, MD  Insulin Pen Needle (PEN NEEDLES 3/16") 31G X 5 MM MISC Use to inject insulin 02/08/23   Almon Hercules, MD  Lancet Device MISC 1 each by Does not apply route in the morning, at noon, and at bedtime. May substitute to any manufacturer covered by patient's insurance. 02/08/23 03/10/23  Almon Hercules, MD  Multiple Vitamin (MULTIVITAMIN) tablet Take 1 tablet by mouth daily.    [provider]  Nutritional Supplements (FEEDING SUPPLEMENT, OSMOLITE 1.5 CAL,) LIQD Place 1,440 mLs  into feeding tube daily. 02/08/23 11/05/23  Almon Hercules, MD  ondansetron (ZOFRAN-ODT) 4 MG disintegrating tablet Take 4 mg by mouth every 8 (eight) hours as needed for refractory nausea / vomiting. 01/27/23 04/27/23  [provider]  oxyCODONE (ROXICODONE) 5 MG immediate release tablet Take 1 tablet (5 mg total) by mouth every 8 (eight) hours as needed for up to 7 days for severe pain or moderate pain. 02/08/23 02/15/23  Almon Hercules, MD  pantoprazole (PROTONIX) 40 MG tablet Take 1 tablet (40 mg total) by mouth 2 (two) times daily. 02/08/23 03/10/23  Almon Hercules, MD  polyethylene glycol powder (MIRALAX) 17 GM/SCOOP powder Take 17 g by mouth 2 (two) times daily as needed for moderate constipation or mild constipation. 02/08/23   Almon Hercules, MD  Protein (FEEDING SUPPLEMENT, PROSOURCE TF20,) liquid Place 60 mLs into feeding tube daily. 02/09/23 11/06/23  Almon Hercules, MD  sodium chloride (MURO 128) 5 % ophthalmic ointment Place 1 Application into both eyes at bedtime.    [provider]  Water For Irrigation,  Sterile (FREE WATER) SOLN Place 100 mLs into feeding tube every 3 (three) hours. 02/08/23 11/05/23  Almon Hercules, MD  Zinc Sulfate (ZINC-220 PO) Take 1 tablet by mouth daily.    [provider]     Physical Exam: Vitals:   02/11/23 2208 02/11/23 2345 02/12/23 0147 02/12/23 0504  BP: 137/84 130/85 119/83 126/79  Pulse: 96 93 81 89  Resp: (!) 25 (!) 26 18 18   Temp: 98.2 F (36.8 C)  97.9 F (36.6 C) (!) 97.5 F (36.4 C)  TempSrc: Oral  Oral Oral  SpO2: 100% 100% 99% 94%  Weight:      Height:        Physical Exam Constitutional:      General: He is not in acute distress.    Appearance: He is not ill-appearing.  HENT:     Mouth/Throat:     Mouth: Mucous membranes are moist.  Eyes:     Pupils: Pupils are equal, round, and reactive to light.  Cardiovascular:     Rate and Rhythm: Normal rate and regular rhythm.     Pulses: Normal pulses.     Heart sounds: Normal  heart sounds.  Pulmonary:     Effort: Pulmonary effort is normal.     Breath sounds: Normal breath sounds.  Abdominal:     General: Bowel sounds are normal. There is no distension.     Palpations: Abdomen is soft.     Tenderness: There is no abdominal tenderness. There is no guarding or rebound.  Musculoskeletal:     Cervical back: Neck supple.     Right lower leg: No edema.     Left lower leg: No edema.  Skin:    Capillary Refill: Capillary refill takes less than 2 seconds.  Neurological:     Mental Status: He is oriented to person, place, and time.  Psychiatric:        Mood and Affect: Mood normal.        Thought Content: Thought content normal.        Judgment: Judgment normal.      Labs on Admission: I have personally reviewed following labs and imaging studies  CBC: Recent Labs  Lab 02/06/23 0129 02/07/23 0840 02/07/23 2359 02/11/23 2151 02/12/23 0406  WBC 15.3* 14.9* 15.3* 15.7* 12.1*  NEUTROABS 10.6* 10.4*  --  10.6*  --   HGB 11.3* 11.0* 10.8* 11.8* 10.5*  HCT 33.5* 33.6* 33.0* 35.8* 32.9*  MCV 85.7 84.4 86.4 86.9 85.7  PLT 279 280 278 377 299   Basic Metabolic Panel: Recent Labs  Lab 02/06/23 0129 02/07/23 0840 02/07/23 2359 02/11/23 2151 02/12/23 0406  NA 131* 130* 132* 133* 135  K 4.2 4.6 4.2 4.3 4.0  CL 99 98 100 98 97*  CO2 22 19* 22 26 22   GLUCOSE 216* 195* 278* 143* 95  BUN 20 20 23  25* 22  CREATININE 0.80 0.78 0.95 0.92 0.80  CALCIUM 8.1* 8.1* 8.2* 8.9 8.5*  MG 2.0 2.0 1.9  --   --   PHOS 3.1 4.0 3.3  --   --    GFR: Estimated Creatinine Clearance: 66 mL/min (by C-G formula based on SCr of 0.8 mg/dL). Liver Function Tests: Recent Labs  Lab 02/06/23 0129 02/07/23 0840 02/07/23 2359 02/11/23 2151 02/12/23 0406  AST  --  31  --  30 27  ALT  --  26  --  35 29  ALKPHOS  --  68  --  77 61  BILITOT  --  0.8  --  0.4 0.5  PROT  --  6.5  --  7.5 6.5  ALBUMIN 2.3* 2.3* 2.3* 2.8* 2.4*   No results for input(s): "LIPASE", "AMYLASE" in  the last 168 hours. No results for input(s): "AMMONIA" in the last 168 hours. Coagulation Profile: No results for input(s): "INR", "PROTIME" in the last 168 hours. Cardiac Enzymes: No results for input(s): "CKTOTAL", "CKMB", "CKMBINDEX", "TROPONINI", "TROPONINIHS" in the last 168 hours. BNP (last 3 results) No results for input(s): "BNP" in the last 8760 hours. HbA1C: No results for input(s): "HGBA1C" in the last 72 hours. CBG: Recent Labs  Lab 02/07/23 0032 02/07/23 0444 02/07/23 0828 02/07/23 2128 02/08/23 0742  GLUCAP 179* 169* 175* 168* 179*   Lipid Profile: No results for input(s): "CHOL", "HDL", "LDLCALC", "TRIG", "CHOLHDL", "LDLDIRECT" in the last 72 hours. Thyroid Function Tests: No results for input(s): "TSH", "T4TOTAL", "FREET4", "T3FREE", "THYROIDAB" in the last 72 hours. Anemia Panel: No results for input(s): "VITAMINB12", "FOLATE", "FERRITIN", "TIBC", "IRON", "RETICCTPCT" in the last 72 hours. Urine analysis:    Component Value Date/Time   COLORURINE AMBER (A) 01/28/2023 1312   APPEARANCEUR HAZY (A) 01/28/2023 1312   APPEARANCEUR Clear 08/28/2021 1113   LABSPEC 1.031 (H) 01/28/2023 1312   PHURINE 5.0 01/28/2023 1312   GLUCOSEU NEGATIVE 01/28/2023 1312   HGBUR NEGATIVE 01/28/2023 1312   BILIRUBINUR NEGATIVE 01/28/2023 1312   BILIRUBINUR Negative 08/28/2021 1113   KETONESUR NEGATIVE 01/28/2023 1312   PROTEINUR 100 (A) 01/28/2023 1312   NITRITE NEGATIVE 01/28/2023 1312   LEUKOCYTESUR NEGATIVE 01/28/2023 1312    Radiological Exams on Admission: I have personally reviewed images No results found.  EKG: My personal interpretation of EKG shows: Ectopic atrial rhythm.  Heart rate 68.  No ST and T wave abnormality.     Assessment/Plan: Principal Problem:   Encounter for feeding tube placement Active Problems:   Displaced nasogastric feeding tube   Esophageal stenosis   Chronic pancreatitis (HCC)   Diabetes type 2, controlled (HCC)   Leukocytosis    Essential hypertension   History of CAD (coronary artery disease)   Atrial fibrillation, chronic -not on coagulation (HCC)   Chronic disease anemia    Assessment and Plan: Displaced NG tube Esophageal stenosis s/p NG tube placement on 03/05/2023 Poor oral tolerance of food Nausea and vomiting -Patient presenting with displaced nasogastric CorTrack tube which has been recently placed on 02/02/2023 in the setting of persistent nausea vomiting with poor oral tolerance due to esophageal stenosis and chronic pancreatitis.  Patient ported nausea and multiple episode of vomiting yesterday and today around 5 PM the NG tube has been came out - Consulted GI Dr. Concha Se recommended clear liquid diet and replace NG tube outpatient versus observation to to get the tube placed. - Admit/patient did not tolerate oral liquid and oral medication. - Consulting IR for NG tube CorTrack placement tomorrow. -Follow-up with IR and gastroenterology in the a.m. - Holding any oral medications and diet as risk of aspiration.  Continue maintenance fluid LR 75 cc/h. -Continue to monitor urine output.  Recent hospital admission for acute pancreatitis on 02/08/2023 Leukocytosis secondary to recent pancreatitis Chronic pancreatitis s/p status post cholecystectomy 11/29/2022 -Patient has history of gallstone pancreatitis status post cholecystomy 11/2022.  CT abdomen pelvis on 01/08/2023 showed acute pancreatitis.  LFT normal.  Lipase has been improved 12 158-65.  Patient just discharged from the hospital 3 days ago after managing up acute pancreatitis however coming with persistent nausea and NG tube  displacement.  Patient was discharged to home cortrack until follow-up with GI on 8/20.  - Patient denies any abdominal pain and afebrile.  WBC count elevated 15.7 which has been persistent since last admission.  Leukocytosis secondary from underlying chronic pancreatitis. -Currently patient is n.p.o. and awaiting for NG tube  placed -Continue to monitor  Insulin-dependent DM type II -A1c 5.8 checked on 11/1718 24 - Currently n.p.o. holding home insulin regimen.  Chronic atrial fibrillation not on anticoagulation -  CHA2DS2-VASc score = 2 -Checking EKG. -At home patient is on aspirin 81 mg daily . -Holding aspirin as patient is n.p.o. - Continue Lopressor 5 mg every 5-minute x 3 doses as needed for atrial fibrillation heart rate above 120.  Chronic normocytic anemia -Stable H&H 11.8 and 35.8.  Continue to monitor  History of CAD -At home is on aspirin 81 mg well.  DVT prophylaxis: Subcutaneous heparin and SCD Code Status: Full code Diet: Currently n.p.o. Family Communication: Treatment plan both with patient and his wife at bedside Disposition Plan: Pending NG tube placement either by IR tomorrow or GI on Monday Consults: [facility] and IR Admission status:   Inpatient, Med-Surg  Severity of Illness: The appropriate patient status for this patient is INPATIENT. Inpatient status is judged to be reasonable and necessary in order to provide the required intensity of service to ensure the patient's safety. The patient's presenting symptoms, physical exam findings, and initial radiographic and laboratory data in the context of their chronic comorbidities is felt to place them at high risk for further clinical deterioration. Furthermore, it is not anticipated that the patient will be medically stable for discharge from the hospital within 2 midnights of admission.   * I certify that at the point of admission it is my clinical judgment that the patient will require inpatient hospital care spanning beyond 2 midnights from the point of admission due to high intensity of service, high risk for further deterioration and high frequency of surveillance required.[name]    Tereasa Coop, MD [facility]  How to contact the [facility] Attending or Consulting provider 7A - 7P or covering provider during after  hours 7P -7A, for this patient.  Check the care team in [facility] and look for a) attending/consulting [facility] provider listed and b) the [facility] team listed Log into [email or web address] and use [facility]'s universal password to access. If you do not have the password, please contact the hospital operator. Locate the [facility] provider you are looking for under [facility] and page to a number that you can be directly reached. If you still have difficulty reaching the provider, please page the [facility] (Director on Call) for the Hospitalists listed on amion for assistance.  02/12/2023, 7:07 AM

## 2023-02-11 NOTE — ED Notes (Addendum)
PT ambulated to the bathroom with assistance with no issues

## 2023-02-11 NOTE — Telephone Encounter (Addendum)
Inbound call from patients wife stating that Dr. Alanda Slim prescribed Prosource  tf 20  for patients feeding supplement that goes in his feeding tube. No where will accept it exact  [facility] accepts and they need prescription sent to fax number: [phone]. Requesting that we sent it over to them so patient can receive prescription next week.  Requesting a call once it has been done. Please advise.

## 2023-02-11 NOTE — ED Provider Notes (Incomplete)
7:53 PM

## 2023-02-12 ENCOUNTER — Encounter (HOSPITAL_COMMUNITY): Payer: Self-pay | Admitting: Internal Medicine

## 2023-02-12 ENCOUNTER — Inpatient Hospital Stay (HOSPITAL_COMMUNITY): Payer: HMO

## 2023-02-12 DIAGNOSIS — D72829 Elevated white blood cell count, unspecified: Secondary | ICD-10-CM | POA: Diagnosis not present

## 2023-02-12 DIAGNOSIS — E1122 Type 2 diabetes mellitus with diabetic chronic kidney disease: Secondary | ICD-10-CM | POA: Diagnosis not present

## 2023-02-12 DIAGNOSIS — Z4659 Encounter for fitting and adjustment of other gastrointestinal appliance and device: Secondary | ICD-10-CM

## 2023-02-12 DIAGNOSIS — K858 Other acute pancreatitis without necrosis or infection: Secondary | ICD-10-CM | POA: Diagnosis not present

## 2023-02-12 DIAGNOSIS — K8689 Other specified diseases of pancreas: Secondary | ICD-10-CM

## 2023-02-12 DIAGNOSIS — R051 Acute cough: Secondary | ICD-10-CM | POA: Diagnosis not present

## 2023-02-12 DIAGNOSIS — I213 ST elevation (STEMI) myocardial infarction of unspecified site: Secondary | ICD-10-CM | POA: Diagnosis not present

## 2023-02-12 DIAGNOSIS — E785 Hyperlipidemia, unspecified: Secondary | ICD-10-CM | POA: Diagnosis not present

## 2023-02-12 DIAGNOSIS — D638 Anemia in other chronic diseases classified elsewhere: Secondary | ICD-10-CM | POA: Diagnosis not present

## 2023-02-12 DIAGNOSIS — T85528A Displacement of other gastrointestinal prosthetic devices, implants and grafts, initial encounter: Secondary | ICD-10-CM | POA: Diagnosis not present

## 2023-02-12 DIAGNOSIS — D509 Iron deficiency anemia, unspecified: Secondary | ICD-10-CM | POA: Diagnosis not present

## 2023-02-12 DIAGNOSIS — K449 Diaphragmatic hernia without obstruction or gangrene: Secondary | ICD-10-CM | POA: Diagnosis not present

## 2023-02-12 DIAGNOSIS — N329 Bladder disorder, unspecified: Secondary | ICD-10-CM | POA: Diagnosis not present

## 2023-02-12 DIAGNOSIS — K863 Pseudocyst of pancreas: Secondary | ICD-10-CM | POA: Diagnosis not present

## 2023-02-12 DIAGNOSIS — I25118 Atherosclerotic heart disease of native coronary artery with other forms of angina pectoris: Secondary | ICD-10-CM | POA: Diagnosis not present

## 2023-02-12 DIAGNOSIS — R1110 Vomiting, unspecified: Secondary | ICD-10-CM | POA: Diagnosis not present

## 2023-02-12 DIAGNOSIS — Z85828 Personal history of other malignant neoplasm of skin: Secondary | ICD-10-CM | POA: Diagnosis not present

## 2023-02-12 DIAGNOSIS — E872 Acidosis, unspecified: Secondary | ICD-10-CM | POA: Diagnosis not present

## 2023-02-12 DIAGNOSIS — R112 Nausea with vomiting, unspecified: Secondary | ICD-10-CM | POA: Diagnosis not present

## 2023-02-12 DIAGNOSIS — K862 Cyst of pancreas: Secondary | ICD-10-CM | POA: Diagnosis not present

## 2023-02-12 DIAGNOSIS — K222 Esophageal obstruction: Secondary | ICD-10-CM | POA: Diagnosis not present

## 2023-02-12 DIAGNOSIS — I482 Chronic atrial fibrillation, unspecified: Secondary | ICD-10-CM | POA: Diagnosis not present

## 2023-02-12 DIAGNOSIS — I2510 Atherosclerotic heart disease of native coronary artery without angina pectoris: Secondary | ICD-10-CM | POA: Diagnosis not present

## 2023-02-12 DIAGNOSIS — K208 Other esophagitis without bleeding: Secondary | ICD-10-CM | POA: Diagnosis not present

## 2023-02-12 DIAGNOSIS — K219 Gastro-esophageal reflux disease without esophagitis: Secondary | ICD-10-CM | POA: Diagnosis not present

## 2023-02-12 DIAGNOSIS — Y831 Surgical operation with implant of artificial internal device as the cause of abnormal reaction of the patient, or of later complication, without mention of misadventure at the time of the procedure: Secondary | ICD-10-CM | POA: Diagnosis present

## 2023-02-12 DIAGNOSIS — Z4682 Encounter for fitting and adjustment of non-vascular catheter: Secondary | ICD-10-CM | POA: Diagnosis not present

## 2023-02-12 DIAGNOSIS — K859 Acute pancreatitis without necrosis or infection, unspecified: Secondary | ICD-10-CM | POA: Diagnosis not present

## 2023-02-12 DIAGNOSIS — I480 Paroxysmal atrial fibrillation: Secondary | ICD-10-CM | POA: Diagnosis not present

## 2023-02-12 DIAGNOSIS — E46 Unspecified protein-calorie malnutrition: Secondary | ICD-10-CM | POA: Diagnosis not present

## 2023-02-12 DIAGNOSIS — K861 Other chronic pancreatitis: Secondary | ICD-10-CM | POA: Diagnosis not present

## 2023-02-12 DIAGNOSIS — K851 Biliary acute pancreatitis without necrosis or infection: Secondary | ICD-10-CM

## 2023-02-12 DIAGNOSIS — K30 Functional dyspepsia: Secondary | ICD-10-CM | POA: Diagnosis not present

## 2023-02-12 DIAGNOSIS — E876 Hypokalemia: Secondary | ICD-10-CM | POA: Diagnosis not present

## 2023-02-12 DIAGNOSIS — K2210 Ulcer of esophagus without bleeding: Secondary | ICD-10-CM | POA: Diagnosis not present

## 2023-02-12 DIAGNOSIS — R634 Abnormal weight loss: Secondary | ICD-10-CM | POA: Diagnosis not present

## 2023-02-12 DIAGNOSIS — K2970 Gastritis, unspecified, without bleeding: Secondary | ICD-10-CM | POA: Diagnosis not present

## 2023-02-12 DIAGNOSIS — E43 Unspecified severe protein-calorie malnutrition: Secondary | ICD-10-CM | POA: Diagnosis not present

## 2023-02-12 DIAGNOSIS — Z794 Long term (current) use of insulin: Secondary | ICD-10-CM | POA: Diagnosis not present

## 2023-02-12 DIAGNOSIS — T85598A Other mechanical complication of other gastrointestinal prosthetic devices, implants and grafts, initial encounter: Secondary | ICD-10-CM

## 2023-02-12 DIAGNOSIS — K5730 Diverticulosis of large intestine without perforation or abscess without bleeding: Secondary | ICD-10-CM | POA: Diagnosis not present

## 2023-02-12 DIAGNOSIS — I10 Essential (primary) hypertension: Secondary | ICD-10-CM | POA: Insufficient documentation

## 2023-02-12 DIAGNOSIS — Z8679 Personal history of other diseases of the circulatory system: Secondary | ICD-10-CM

## 2023-02-12 DIAGNOSIS — E119 Type 2 diabetes mellitus without complications: Secondary | ICD-10-CM | POA: Diagnosis not present

## 2023-02-12 DIAGNOSIS — Z7982 Long term (current) use of aspirin: Secondary | ICD-10-CM | POA: Diagnosis not present

## 2023-02-12 DIAGNOSIS — R932 Abnormal findings on diagnostic imaging of liver and biliary tract: Secondary | ICD-10-CM | POA: Diagnosis not present

## 2023-02-12 DIAGNOSIS — I129 Hypertensive chronic kidney disease with stage 1 through stage 4 chronic kidney disease, or unspecified chronic kidney disease: Secondary | ICD-10-CM | POA: Diagnosis not present

## 2023-02-12 DIAGNOSIS — K311 Adult hypertrophic pyloric stenosis: Secondary | ICD-10-CM | POA: Diagnosis not present

## 2023-02-12 DIAGNOSIS — Z79899 Other long term (current) drug therapy: Secondary | ICD-10-CM | POA: Diagnosis not present

## 2023-02-12 DIAGNOSIS — Z6822 Body mass index (BMI) 22.0-22.9, adult: Secondary | ICD-10-CM | POA: Diagnosis not present

## 2023-02-12 DIAGNOSIS — R933 Abnormal findings on diagnostic imaging of other parts of digestive tract: Secondary | ICD-10-CM | POA: Diagnosis not present

## 2023-02-12 LAB — COMPREHENSIVE METABOLIC PANEL
ALT: 29 U/L (ref 0–44)
AST: 27 U/L (ref 15–41)
Albumin: 2.4 g/dL — ABNORMAL LOW (ref 3.5–5.0)
Alkaline Phosphatase: 61 U/L (ref 38–126)
Anion gap: 16 — ABNORMAL HIGH (ref 5–15)
BUN: 22 mg/dL (ref 8–23)
CO2: 22 mmol/L (ref 22–32)
Calcium: 8.5 mg/dL — ABNORMAL LOW (ref 8.9–10.3)
Chloride: 97 mmol/L — ABNORMAL LOW (ref 98–111)
Creatinine, Ser: 0.8 mg/dL (ref 0.61–1.24)
GFR, Estimated: >60 mL/min (ref >60)
Glucose, Bld: 95 mg/dL (ref 70–99)
Potassium: 4 mmol/L (ref 3.5–5.1)
Sodium: 135 mmol/L (ref 135–145)
Total Bilirubin: 0.5 mg/dL (ref 0.3–1.2)
Total Protein: 6.5 g/dL (ref 6.5–8.1)

## 2023-02-12 LAB — CBC
HCT: 32.9 % — ABNORMAL LOW (ref 39.0–52.0)
Hemoglobin: 10.5 g/dL — ABNORMAL LOW (ref 13.0–17.0)
MCH: 27.3 pg (ref 26.0–34.0)
MCHC: 31.9 g/dL (ref 30.0–36.0)
MCV: 85.7 fL (ref 80.0–100.0)
Platelets: 299 10*3/uL (ref 150–400)
RBC: 3.84 MIL/uL — ABNORMAL LOW (ref 4.22–5.81)
RDW: 14.9 % (ref 11.5–15.5)
WBC: 12.1 10*3/uL — ABNORMAL HIGH (ref 4.0–10.5)
nRBC: 0 % (ref 0.0–0.2)

## 2023-02-12 MED ORDER — IOHEXOL 350 MG/ML SOLN
75.0000 mL | Freq: Once | INTRAVENOUS | Status: AC | PRN
  Administered 2023-02-12: 75 mL via INTRAVENOUS

## 2023-02-12 MED ORDER — SODIUM CHLORIDE 0.9% FLUSH: 3.0000 mL | INTRAVENOUS | Status: DC | PRN

## 2023-02-12 MED ORDER — METOPROLOL TARTRATE 5 MG/5ML IV SOLN: 5.0000 mg | INTRAVENOUS | Status: DC | PRN

## 2023-02-12 MED ORDER — LACTATED RINGERS IV SOLN: INTRAVENOUS | Status: DC

## 2023-02-12 MED ORDER — SODIUM CHLORIDE 0.9% FLUSH
3.0000 mL | Freq: Two times a day (BID) | INTRAVENOUS | Status: DC
  Administered 2023-02-12 – 2023-02-26 (×21): 3 mL via INTRAVENOUS

## 2023-02-12 MED ORDER — ACETAMINOPHEN 325 MG PO TABS: 650.0000 mg | ORAL_TABLET | Freq: Four times a day (QID) | ORAL | Status: DC | PRN

## 2023-02-12 MED ORDER — ACETAMINOPHEN 650 MG RE SUPP: 650.0000 mg | Freq: Four times a day (QID) | RECTAL | Status: DC | PRN

## 2023-02-12 MED ORDER — SENNOSIDES-DOCUSATE SODIUM 8.6-50 MG PO TABS
1.0000 | ORAL_TABLET | Freq: Once | ORAL | Status: AC
  Administered 2023-02-12: 1 via ORAL
  Filled 2023-02-12: qty 1

## 2023-02-12 MED ORDER — SODIUM CHLORIDE 0.9 % IV SOLN: 250.0000 mL | INTRAVENOUS | Status: DC | PRN

## 2023-02-12 MED ORDER — HEPARIN SODIUM (PORCINE) 5000 UNIT/ML IJ SOLN
5000.0000 [IU] | Freq: Three times a day (TID) | INTRAMUSCULAR | Status: DC
  Administered 2023-02-12 – 2023-02-20 (×22): 5000 [IU] via SUBCUTANEOUS
  Filled 2023-02-12 (×23): qty 1

## 2023-02-12 MED ORDER — HYDRALAZINE HCL 20 MG/ML IJ SOLN: 10.0000 mg | Freq: Four times a day (QID) | INTRAMUSCULAR | Status: DC | PRN

## 2023-02-12 MED ORDER — ONDANSETRON HCL 4 MG/2ML IJ SOLN
4.0000 mg | Freq: Four times a day (QID) | INTRAMUSCULAR | Status: DC | PRN
  Administered 2023-02-12 – 2023-02-13 (×3): 4 mg via INTRAVENOUS
  Filled 2023-02-12 (×3): qty 2

## 2023-02-12 MED ORDER — ONDANSETRON HCL 4 MG PO TABS: 4.0000 mg | ORAL_TABLET | Freq: Four times a day (QID) | ORAL | Status: DC | PRN

## 2023-02-12 NOTE — Plan of Care (Signed)

## 2023-02-12 NOTE — Consult Note (Addendum)
Consultation Note   Referring Provider:  [facility] PCP: Danella Penton, MD Primary Gastroenterologist: Dr. Mia Creek at [facility] Reason for Consultation: vomiting and Cortrak fell out  DOA: 02/11/2023         Hospital Day: 2   ASSESSMENT & PLAN   82 y.o. year old male with severe gallstone pancreatitis, s/p ERCP with sphincterotomy and stone removal late May .  Severe pancreatitis has been complicated by multiple fluid collections. He has had difficulty with nausea/vomiting and an inability to maintain nutrition necessitating enteral feedings.  He his currently admitted after cortrak was dislodged secondary to vomiting which occurs when he has to take oral medications with water or broth -- Will repeat CTAP to see if fluid collections are enlarging  --Will try and have Cortrak replaced though not sure the Cortrak team is here on the weekend --Supportive care in the interim  Additional medical history includes : CAD , CKD , DM 2   ---------------------------------------------------------------------------------------------------    [facility] Attending   I have taken an interval history, reviewed the chart and examined the patient. I agree with the Advanced Practitioner's note, impression and recommendations with the following additions:   We will see what the CT shows.  He seems to vomit as soon as liquid hits his stomach.  He wonders if it is not a mental issue which it could be.  However he could have enlarging fluid collections compressing the stomach causing problems.  Further plans pending the above.  Iva Boop, MD, [facility] See Loretha Stapler on call - gastroenterology for best contact person 02/12/2023 1:57 PM    HISTORY OF PRESENT ILLNESS   Brief GI history:  We saw him in the hospital May 2024 for acute gallstone pancreatitis/choledocholithiasis.  It was a prolonged admission.  He finally improved and  an ERCP was attempted 11/22/2022.  Unfortunately due to edema it was not successful and the procedure was aborted.  Follow-up MRCP showed a persistent small stone in the CBD.  His LFTs eventually normalized.  His abdominal pain improved but he was having problems maintaining nutrition.  After several days in the hospital and MRCP was repeated and again he still had a small stone in the CBD.  Also pancreatic edema was worse and there were areas of hemorrhage, pseudocyst and a small area of necrosis.  It was felt that the inflammation was causing compression of the distal effect on the CBD.  He remained in the hospital several more days getting tube feeds.  Finally discharged home on 6/7.   Patient readmitted the end of July with ongoing abdominal pain , N/V.   Repeat CT scan showed severe acute pancreatitis in the head of the pancreas. There was interval increase in number and size of multiple loculated fluidcollections in and around head of the pancreas suggesting interval worsening of acute pancreatitis with possible multiple pseudocysts or foci of pancreatic necrosis.  There was wall thickening in the distal antrum of the stomach and the duodenum, possibly gastritis and duodenitis. He underwent inpatient  EGD which showed a benign appearing esophageal stenosis and congested, erythematous, granular, nodular and ulcerated mucosa in the gastric fundus and gastric body. He was discharged home 8/6 with a cortrak for  enteral nutrition.   Interval history:  Patient was tolerating tube feeding but has not tolerated oral medication which she has been trying to take with water and or broth.  P.o. intake makes him nauseated and yesterday he violently vomited up pills as well as the cortrak.  He went to [facility] where they had to take the core track out in pieces.  Patient was transferred to [facility] for [place] replacement  Ajeet's abdominal pain has actually improved overall.  He was tolerating cortrak feedings  at home and overall doing relatively well except when he had to take p.o. meds.  Previous GI Evaluations   12/02/22 ERCP - J-shaped gastric deformity. - Gastritis in antrum. No other gross lesions in the entire stomach. Biopsied for H. pylori. - No gross lesions in the duodenal bulb, in the first portion of the duodenum and in the second portion of the duodenum. - The major papilla appeared normal (though hidden under multiple folds). - The fluoroscopic examination was suspicious for sludge. - Choledocholithiasis was found. Complete removal was accomplished by biliary sphincterotomy and balloon sweeping. - The very distal biliary duct had a slight narrowing compared to the majority of the bile duct, though this is not clearly a stricture and the balloon could not pass through this area with ease after stone extraction.  Labs and Imaging: Recent Labs    02/11/23 2151 02/12/23 0406  WBC 15.7* 12.1*  HGB 11.8* 10.5*  HCT 35.8* 32.9*  PLT 377 299   Recent Labs    02/11/23 2151 02/12/23 0406  NA 133* 135  K 4.3 4.0  CL 98 97*  CO2 26 22  GLUCOSE 143* 95  BUN 25* 22  CREATININE 0.92 0.80  CALCIUM 8.9 8.5*   Recent Labs    02/12/23 0406  PROT 6.5  ALBUMIN 2.4*  AST 27  ALT 29  ALKPHOS 61  BILITOT 0.5   No results for input(s): "HEPBSAG", "HCVAB", "HEPAIGM", "HEPBIGM" in the last 72 hours. No results for input(s): "LABPROT", "INR" in the last 72 hours.    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   Basal cell carcinoma of skin    a.) s/p Mohs procedure R/L nasal ala 01/27/2015   CAD (coronary artery disease) 05/12/2013   a.) STEMI --> LHC 05/12/2013: 20% p-mLAD, 20% D2, 40% LPDA, 30% LPAV, 99% dLCx --> PCI placing 4.5 x 20 mm and 2.5 x 12 mm Veriflex BMS to LPAV extending to dLCx. b.) cCTA 12/27/2017: Ca score 1578; 82nd percentile for  age/sex match control; CT-FFR: RCA 0.99, pLAD 0.97, mLAD 0.98, dLAD 0.89, pLCx 0.98, mLCx 0.97, dLCx 0.97, PLB 0.91, PDA (most distal segment) 0.80.   Cataract    a.) s/p extraction with IOL placement   CKD (chronic kidney disease)    Diverticulosis    GERD (gastroesophageal reflux disease)    History of kidney stones 2023   HLD (hyperlipidemia)    Left lower lobe pulmonary nodule 08/01/2021   a.) CT 08/01/2021: measured 4 mm; perifissural   Long term current use of anticoagulant    a.) apixaban   NSVT (nonsustained ventricular tachycardia) (HCC) 05/13/2013   a.) 24 beat run while in ICU following PCI for STEMI   PAF (paroxysmal atrial fibrillation) (HCC) 11/16/2021   a.) new onset in ED on 11/16/2021. b.) CHA2DS2-VASc = 4 (age x  2, STEMI/aortic plaque, T2DM). c.) rate controlled without pharmacological intervention; started on apixaban 11/16/2021, however PCP discontinued on 11/18/2021 pending Holter study results.   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   Type 2 diabetes, diet controlled [facility])     Past Surgical History:  Procedure Laterality Date   BIOPSY  12/02/2022   Procedure: BIOPSY;  Surgeon: Meridee Score Netty Starring., MD;  Location: [facility] ENDOSCOPY;  Service: Gastroenterology;;   BIOPSY  01/31/2023   Procedure: BIOPSY;  Surgeon: Imogene Burn, MD;  Location: [facility] ENDOSCOPY;  Service: Gastroenterology;;   CATARACT EXTRACTION W/ INTRAOCULAR LENS IMPLANT Bilateral    CHOLECYSTECTOMY N/A 12/03/2022   Procedure: LAPAROSCOPIC CHOLECYSTECTOMY;  Surgeon: [name], MD;  Location: [facility] OR;  Service: General;  Laterality: N/A;   COLONOSCOPY N/A 01/31/2001   COLONOSCOPY N/A 11/19/2008   CORONARY ANGIOPLASTY WITH STENT PLACEMENT Left 05/12/2013   Procedure: CORONARY  ANGIOPLASTY WITH STENT PLACEMENT; Location: [facility]; Surgeon: Lieutenant Diego, MD   ENDOSCOPIC RETROGRADE CHOLANGIOPANCREATOGRAPHY (ERCP) WITH PROPOFOL N/A 11/22/2022   Procedure: ENDOSCOPIC RETROGRADE CHOLANGIOPANCREATOGRAPHY (ERCP) WITH PROPOFOL;  Surgeon: Iva Boop, MD;  Location: [facility] ENDOSCOPY;  Service: Gastroenterology;  Laterality: N/A;   ERCP N/A 12/02/2022   Procedure: ENDOSCOPIC RETROGRADE CHOLANGIOPANCREATOGRAPHY (ERCP);  Surgeon: [name]., MD;  Location: [facility] ENDOSCOPY;  Service: Gastroenterology;  Laterality: N/A;   ESOPHAGOGASTRODUODENOSCOPY N/A 03/03/2000   ESOPHAGOGASTRODUODENOSCOPY (EGD) WITH PROPOFOL N/A 10/16/2021   Procedure: ESOPHAGOGASTRODUODENOSCOPY (EGD) WITH PROPOFOL;  Surgeon: Regis Bill, MD;  Location: [facility] ENDOSCOPY;  Service: Endoscopy;  Laterality: N/A;   ESOPHAGOGASTRODUODENOSCOPY (EGD) WITH PROPOFOL N/A 01/31/2023   Procedure: ESOPHAGOGASTRODUODENOSCOPY (EGD) WITH PROPOFOL;  Surgeon: Imogene Burn, MD;  Location: [facility] ENDOSCOPY;  Service: Gastroenterology;  Laterality: N/A;   EXTRACORPOREAL SHOCK WAVE LITHOTRIPSY Left 08/06/2021   Procedure: EXTRACORPOREAL SHOCK WAVE LITHOTRIPSY (ESWL);  Surgeon: Vanna Scotland, MD;  Location: [facility] ORS;  Service: Urology;  Laterality: Left;   INGUINAL HERNIA REPAIR Right 11/27/2021   Procedure: HERNIA REPAIR INGUINAL ADULT;  Surgeon: Earline Mayotte, MD;  Location: [facility] ORS;  Service: General;  Laterality: Right;   INTRAOCULAR LENS EXCHANGE Left 12/27/2018   Procedure: MECHANICAL VITRECTOMY,  EXCHANGE LENS PROSTHESIS VIA PARS PLANA APPROACH; Location: [facility]; Surgeon: Nicholaus Corolla, MD   MOHS SURGERY Bilateral 01/27/2015   Procedure: MOHS SURGERY TO REMOVE BASAL CELL CARCINOMA FROM RIGHT/LEFT NASAL ALA; Location: [facility]; Surgeon: Katrine Coho, MD   REMOVAL OF STONES  12/02/2022   Procedure: REMOVAL OF STONES;  Surgeon: Meridee Score Netty Starring., MD;  Location: [facility] ENDOSCOPY;  Service: Gastroenterology;;   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/2019   Procedure: RPR RETINAL DTCHMNT W/VITRECTOMY ANY METH REPAIR RETINAL DETACHMENT; WITH VITRECTOMY, INC, WHEN PERFORMED, AIR/GAS TAMPONADE, FOCAL ENDOLASER PHOTOCOAGULATION, CRYOTHERAPY, DRAINAGE OF SUBRETINAL FLUID, SCLERAL BUCKLING AND/OR REMOVAL OF LENS; Location: [facility]; Surgeon: Nicholaus Corolla, MD   SINUSOTOMY N/A    Procedure: MAXILLARY SINUSOTOMY INTRANASAL   SPHINCTEROTOMY  12/02/2022   Procedure: SPHINCTEROTOMY;  Surgeon: Mansouraty, Netty Starring., MD;  Location: [facility] ENDOSCOPY;  Service: Gastroenterology;;   TONSILLECTOMY AND ADENOIDECTOMY N/A     History reviewed. No pertinent family history.  Prior to Admission medications   Medication Sig Start Date End Date Taking? Authorizing Provider  Ascorbic Acid (VITAMIN C ER PO) Take 1 tablet by  mouth daily.   Yes [provider]  aspirin 81 MG chewable tablet Chew 81 mg by mouth daily.   Yes [provider]  CREON 36000-114000 units CPEP capsule Take 36,000 Units by mouth 3 (three) times daily.   Yes [provider]  Docusate Sodium (DSS) 100 MG CAPS Take 100 mg by mouth daily as needed (for constipaion).   Yes [provider]  Insulin Glargine (BASAGLAR KWIKPEN) 100 UNIT/ML Inject 10 Units into the skin daily. 02/08/23  Yes Almon Hercules, MD  Multiple Vitamin (MULTIVITAMIN) tablet Take 1 tablet by mouth daily.   Yes [provider]  Nutritional Supplements (FEEDING SUPPLEMENT, OSMOLITE 1.5 CAL,) LIQD Place 1,440 mLs into feeding tube daily. 02/08/23 11/05/23 Yes Almon Hercules, MD  pantoprazole (PROTONIX) 40 MG tablet Take 1 tablet (40 mg total) by mouth 2 (two) times daily. 02/08/23 03/10/23 Yes Almon Hercules, MD  polyethylene glycol powder (MIRALAX) 17 GM/SCOOP powder Take 17 g by mouth 2 (two) times daily as needed for moderate constipation or mild constipation. 02/08/23  Yes Almon Hercules, MD  sodium chloride (MURO 128) 5 % ophthalmic ointment Place 1 Application into both eyes at  bedtime.   Yes [provider]  Water For Irrigation, Sterile (FREE WATER) SOLN Place 100 mLs into feeding tube every 3 (three) hours. Patient taking differently: Place 100 mLs into feeding tube every 2 (two) hours. 02/08/23 11/05/23 Yes Almon Hercules, MD  Zinc Sulfate (ZINC-220 PO) Take 1 tablet by mouth daily.   Yes [provider]  acetaminophen (TYLENOL) 325 MG tablet Place 2 tablets (650 mg total) into feeding tube every 6 (six) hours as needed for mild pain. Patient not taking: Reported on 02/11/2023 02/08/23   Almon Hercules, MD  Blood Glucose Monitoring Suppl DEVI 1 each by Does not apply route in the morning, at noon, and at bedtime. May substitute to any manufacturer covered by patient's insurance. 02/08/23   Almon Hercules, MD  Glucose Blood (BLOOD GLUCOSE TEST STRIPS) STRP 1 each by In Vitro route in the morning, at noon, and at bedtime. May substitute to any manufacturer covered by patient's insurance. 02/08/23 03/13/23  Almon Hercules, MD  Insulin Pen Needle (PEN NEEDLES 3/16") 31G X 5 MM MISC Use to inject insulin 02/08/23   Almon Hercules, MD  Lancet Device MISC 1 each by Does not apply route in the morning, at noon, and at bedtime. May substitute to any manufacturer covered by patient's insurance. 02/08/23 03/10/23  Almon Hercules, MD  ondansetron (ZOFRAN-ODT) 4 MG disintegrating tablet Take 4 mg by mouth every 8 (eight) hours as needed for refractory nausea / vomiting. Patient not taking: Reported on 02/11/2023 01/27/23 04/27/23  [provider]  oxyCODONE (ROXICODONE) 5 MG immediate release tablet Take 1 tablet (5 mg total) by mouth every 8 (eight) hours as needed for up to 7 days for severe pain or moderate pain. Patient not taking: Reported on 02/11/2023 02/08/23 02/15/23  Almon Hercules, MD  Protein (FEEDING SUPPLEMENT, PROSOURCE TF20,) liquid Place 60 mLs into feeding tube daily. Patient not taking: Reported on 02/12/2023 02/09/23 11/06/23  Almon Hercules, MD    Current  Facility-Administered Medications  Medication Dose Route Frequency Provider Last Rate Last Admin   0.9 %  sodium chloride infusion  250 mL Intravenous PRN Janalyn Shy, Subrina, MD       acetaminophen (TYLENOL) tablet 650 mg  650 mg Oral Q6H PRN Tereasa Coop, MD  Or   acetaminophen (TYLENOL) suppository 650 mg  650 mg Rectal Q6H PRN Janalyn Shy, Subrina, MD       heparin injection 5,000 Units  5,000 Units Subcutaneous Q8H Janalyn Shy, Subrina, MD   5,000 Units at 02/12/23 7564   hydrALAZINE (APRESOLINE) injection 10 mg  10 mg Intravenous Q6H PRN Janalyn Shy, Subrina, MD       lactated ringers infusion   Intravenous Continuous Janalyn Shy, Subrina, MD 75 mL/hr at 02/12/23 0658 Infusion Verify at 02/12/23 0658   metoprolol tartrate (LOPRESSOR) injection 5 mg  5 mg Intravenous Q5 min PRN Janalyn Shy, Subrina, MD       ondansetron [facility]) tablet 4 mg  4 mg Oral Q6H PRN Janalyn Shy, Subrina, MD       Or   ondansetron [facility]) injection 4 mg  4 mg Intravenous Q6H PRN Janalyn Shy, Subrina, MD       senna-docusate (Senokot-S) tablet 1 tablet  1 tablet Oral Once Lurene Shadow, MD       sodium chloride flush (NS) 0.9 % injection 3 mL  3 mL Intravenous Q12H Sundil, Subrina, MD   3 mL at 02/12/23 0943   sodium chloride flush (NS) 0.9 % injection 3 mL  3 mL Intravenous PRN Tereasa Coop, MD        Allergies as of 02/11/2023   (No Known Allergies)    Social History   Socioeconomic History   Marital status: Married    Spouse name: Dominyc Pearson   Number of children: Not on file   Years of education: Not on file   Highest education level: Not on file  Occupational History   Not on file  Tobacco Use   Smoking status: Never   Smokeless tobacco: Never  Vaping Use   Vaping status: Never Used  Substance and Sexual Activity   Alcohol use: Never   Drug use: Never   Sexual activity: Yes  Other Topics Concern   Not on file  Social History Narrative   Lives at home with wife.   Social Determinants of Health   Financial  Resource Strain: Low Risk  (11/17/2022)   Overall Financial Resource Strain (CARDIA)    Difficulty of Paying Living Expenses: Not hard at all  Food Insecurity: No Food Insecurity (02/12/2023)   Hunger Vital Sign    Worried About Running Out of Food in the Last Year: Never true    Ran Out of Food in the Last Year: Never true  Transportation Needs: No Transportation Needs (02/12/2023)   PRAPARE - Administrator, Civil Service (Medical): No    Lack of Transportation (Non-Medical): No  Physical Activity: Sufficiently Active (11/17/2022)   Exercise Vital Sign    Days of Exercise per Week: 6 days    Minutes of Exercise per Session: 60 min  Stress: No Stress Concern Present (11/17/2022)   [employer] of Occupational Health - Occupational Stress Questionnaire    Feeling of Stress : Not at all  Social Connections: Socially Integrated (11/17/2022)   Social Connection and Isolation Panel [NHANES]    Frequency of Communication with Friends and Family: More than three times a week    Frequency of Social Gatherings with Friends and Family: More than three times a week    Attends Religious Services: More than 4 times per year    Active Member of [employer] or Organizations: Yes    Attends Banker Meetings: 1 to 4 times per year    Marital Status: Married  Catering manager Violence: Not  At Risk (02/12/2023)   Humiliation, Afraid, Rape, and Kick questionnaire    Fear of Current or Ex-Partner: No    Emotionally Abused: No    Physically Abused: No    Sexually Abused: No     Code Status   Code Status: Full Code  Review of Systems: All systems reviewed and negative except where noted in HPI.  Physical Exam: Vital signs in last 24 hours: Temp:  [97.5 F (36.4 C)-98.4 F (36.9 C)] 97.5 F (36.4 C) (08/10 0724) Pulse Rate:  [81-108] 87 (08/10 0724) Resp:  [16-26] 16 (08/10 0724) BP: (103-137)/(59-85) 103/59 (08/10 0724) SpO2:  [94 %-100 %] 97 % (08/10 0724) Weight:  [66.7  kg] 66.7 kg (08/09 1939)    General:  Pleasant male in NAD Psych:  Cooperative. Normal mood and affect Eyes: Pupils equal Ears:  Normal auditory acuity Nose: No deformity, discharge or lesions Neck:  Supple, no masses felt Lungs:  Clear to auscultation.  Heart:  Regular rate, regular rhythm.  Abdomen:  Soft, nondistended, nontender, active bowel sounds, no masses felt Rectal :  Deferred Msk: Symmetrical without gross deformities.  Neurologic:  Alert, oriented, grossly normal neurologically Extremities : No edema Skin:  Intact without significant lesions.    Intake/Output from previous day: 08/09 0701 - 08/10 0700 In: 382.7 [I.V.:382.7] Out: -  Intake/Output this shift:  No intake/output data recorded.  Principal Problem:   Encounter for feeding tube placement Active Problems:   Diabetes type 2, controlled (HCC)   Displaced nasogastric feeding tube   Esophageal stenosis   Chronic pancreatitis (HCC)   Leukocytosis   Essential hypertension   History of CAD (coronary artery disease)   Atrial fibrillation, chronic -not on coagulation (HCC)   Chronic disease anemia    Willette Cluster, NP-C @  02/12/2023, 11:44 AM

## 2023-02-12 NOTE — ED Notes (Signed)
ED TO INPATIENT HANDOFF REPORT  ED Nurse Name and Phone #: Rodney Booze [phone]  S Name/Age/Gender Jon Bray 82 y.o. male Room/Bed: 029C/029C  Code Status   Code Status: Full Code  Home/SNF/Other Home Patient oriented to: self, place, time, and situation Is this baseline? Yes   Triage Complete: Triage complete  Chief Complaint Encounter for feeding tube placement [Z46.59]  Triage Note Pt presents with an NG tube that is supposed to go to the small intestines. Pt vomited when attempting to take medication this evening and the tube became dislodged and came out of his mouth.    Allergies No Known Allergies  Level of Care/Admitting Diagnosis ED Disposition     ED Disposition  Admit   Condition  --   Comment  Hospital Area: [facility] [[id number]]  Level of Care: Med-Surg [16]  May admit patient to [facility] or [facility] if equivalent level of care is available:: No  Covid Evaluation: Asymptomatic - no recent exposure (last 10 days) testing not required  Diagnosis: Encounter for feeding tube placement [284132]  Admitting Physician: Tereasa Coop [[id number]]  Attending Physician: Tereasa Coop [[id number]]  Bed request comments: [facility]  Certification:: I certify this patient will need inpatient services for at least 2 midnights  Estimated Length of Stay: 4          B Medical/Surgery History Past Medical History:  Diagnosis Date   Anginal pain (HCC)    Aortic atherosclerosis (HCC)    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   Basal cell carcinoma of skin    a.) s/p Mohs procedure R/L nasal ala 01/27/2015   CAD (coronary artery disease) 05/12/2013   a.) STEMI --> LHC 05/12/2013: 20% p-mLAD, 20% D2, 40% LPDA, 30% LPAV, 99% dLCx --> PCI placing 4.5 x 20 mm and 2.5 x 12 mm Veriflex BMS to LPAV extending to dLCx. b.) cCTA 12/27/2017: Ca score 1578; 82nd percentile for age/sex match control; CT-FFR: RCA 0.99, pLAD  0.97, mLAD 0.98, dLAD 0.89, pLCx 0.98, mLCx 0.97, dLCx 0.97, PLB 0.91, PDA (most distal segment) 0.80.   Cataract    a.) s/p extraction with IOL placement   CKD (chronic kidney disease)    Diverticulosis    GERD (gastroesophageal reflux disease)    History of kidney stones 2023   HLD (hyperlipidemia)    Left lower lobe pulmonary nodule 08/01/2021   a.) CT 08/01/2021: measured 4 mm; perifissural   Long term current use of anticoagulant    a.) apixaban   NSVT (nonsustained ventricular tachycardia) (HCC) 05/13/2013   a.) 24 beat run while in ICU following PCI for STEMI   PAF (paroxysmal atrial fibrillation) (HCC) 11/16/2021   a.) new onset in ED on 11/16/2021. b.) CHA2DS2-VASc = 4 (age x 2, STEMI/aortic plaque, T2DM). c.) rate controlled without pharmacological intervention; started on apixaban 11/16/2021, however PCP discontinued on 11/18/2021 pending Holter study results.   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   Type 2 diabetes, diet controlled [facility])    Past Surgical History:  Procedure Laterality Date   BIOPSY  12/02/2022   Procedure: BIOPSY;  Surgeon: Meridee Score Netty Starring., MD;  Location: [facility] ENDOSCOPY;  Service: Gastroenterology;;   BIOPSY  01/31/2023   Procedure: BIOPSY;  Surgeon: Imogene Burn, MD;  Location: [facility] ENDOSCOPY;  Service: Gastroenterology;;   CATARACT EXTRACTION W/ INTRAOCULAR LENS IMPLANT Bilateral    CHOLECYSTECTOMY N/A 12/03/2022   Procedure: LAPAROSCOPIC CHOLECYSTECTOMY;  Surgeon: [name], MD;  Location: [facility] OR;  Service: General;  Laterality: N/A;   COLONOSCOPY N/A 01/31/2001   COLONOSCOPY N/A 11/19/2008   CORONARY ANGIOPLASTY WITH STENT PLACEMENT Left 05/12/2013   Procedure: CORONARY ANGIOPLASTY WITH STENT PLACEMENT; Location: [facility];  Surgeon: Lieutenant Diego, MD   ENDOSCOPIC RETROGRADE CHOLANGIOPANCREATOGRAPHY (ERCP) WITH PROPOFOL N/A 11/22/2022   Procedure: ENDOSCOPIC RETROGRADE CHOLANGIOPANCREATOGRAPHY (ERCP) WITH PROPOFOL;  Surgeon: Iva Boop, MD;  Location: [facility] ENDOSCOPY;  Service: Gastroenterology;  Laterality: N/A;   ERCP N/A 12/02/2022   Procedure: ENDOSCOPIC RETROGRADE CHOLANGIOPANCREATOGRAPHY (ERCP);  Surgeon: [name]., MD;  Location: [facility] ENDOSCOPY;  Service: Gastroenterology;  Laterality: N/A;   ESOPHAGOGASTRODUODENOSCOPY N/A 03/03/2000   ESOPHAGOGASTRODUODENOSCOPY (EGD) WITH PROPOFOL N/A 10/16/2021   Procedure: ESOPHAGOGASTRODUODENOSCOPY (EGD) WITH PROPOFOL;  Surgeon: Regis Bill, MD;  Location: [facility] ENDOSCOPY;  Service: Endoscopy;  Laterality: N/A;   ESOPHAGOGASTRODUODENOSCOPY (EGD) WITH PROPOFOL N/A 01/31/2023   Procedure: ESOPHAGOGASTRODUODENOSCOPY (EGD) WITH PROPOFOL;  Surgeon: Imogene Burn, MD;  Location: [facility] ENDOSCOPY;  Service: Gastroenterology;  Laterality: N/A;   EXTRACORPOREAL SHOCK WAVE LITHOTRIPSY Left 08/06/2021   Procedure: EXTRACORPOREAL SHOCK WAVE LITHOTRIPSY (ESWL);  Surgeon: Vanna Scotland, MD;  Location: [facility] ORS;  Service: Urology;  Laterality: Left;   INGUINAL HERNIA REPAIR Right 11/27/2021   Procedure: HERNIA REPAIR INGUINAL ADULT;  Surgeon: Earline Mayotte, MD;  Location: [facility] ORS;  Service: General;  Laterality: Right;   INTRAOCULAR LENS EXCHANGE Left 12/27/2018   Procedure: MECHANICAL VITRECTOMY,  EXCHANGE LENS PROSTHESIS VIA PARS PLANA APPROACH; Location: [facility]; Surgeon: Nicholaus Corolla, MD   MOHS SURGERY Bilateral 01/27/2015   Procedure: MOHS SURGERY TO REMOVE BASAL CELL CARCINOMA FROM RIGHT/LEFT NASAL ALA; Location: [facility]; Surgeon: Katrine Coho, MD   REMOVAL OF STONES  12/02/2022   Procedure: REMOVAL OF STONES;  Surgeon: Meridee Score Netty Starring., MD;  Location: [facility] ENDOSCOPY;  Service: Gastroenterology;;   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   Procedure: RPR  RETINAL DTCHMNT W/VITRECTOMY ANY METH REPAIR RETINAL DETACHMENT; WITH VITRECTOMY, INC, WHEN PERFORMED, AIR/GAS TAMPONADE, FOCAL ENDOLASER PHOTOCOAGULATION, CRYOTHERAPY, DRAINAGE OF SUBRETINAL FLUID, SCLERAL BUCKLING AND/OR REMOVAL OF LENS; Location: [facility]; Surgeon: Nicholaus Corolla, MD   SINUSOTOMY N/A    Procedure: MAXILLARY SINUSOTOMY INTRANASAL   SPHINCTEROTOMY  12/02/2022   Procedure: SPHINCTEROTOMY;  Surgeon: Mansouraty, Netty Starring., MD;  Location: [facility] ENDOSCOPY;  Service: Gastroenterology;;   TONSILLECTOMY AND ADENOIDECTOMY N/A      A IV Location/Drains/Wounds Patient Lines/Drains/Airways Status     Active Line/Drains/Airways     Name Placement date Placement time Site Days   Peripheral IV 02/12/23 20 G 2.5" Right;Upper Arm 02/12/23  0021  Arm  less than 1   Incision - 4 Ports Abdomen 1: Mid;Upper 2: Umbilicus 3: Right 4: Right;Lateral 12/03/22  0940  -- 71   Small Bore Feeding Tube 10 Fr. Left nare Marking at nare/corner of mouth 92 cm 02/02/23  1056  Left nare  10            Intake/Output Last 24 hours No intake or output data in the 24 hours ending 02/12/23 0032  Labs/Imaging Results for orders placed or performed during the hospital encounter of 02/11/23 (from the past 48 hour(s))  CBC with Differential     Status: Abnormal   Collection Time: 02/11/23  9:51 PM  Result Value Ref Range   WBC 15.7 (H) 4.0 - 10.5 K/uL   RBC 4.12 (L) 4.22 - 5.81 MIL/uL   Hemoglobin 11.8 (L) 13.0 - 17.0 g/dL   HCT 40.9 (L) 81.1 - 91.4 %   MCV 86.9 80.0 - 100.0 fL   MCH 28.6 26.0 - 34.0 pg   MCHC 33.0 30.0 - 36.0 g/dL   RDW 78.2 95.6 - 21.3 %   Platelets 377 150 - 400 K/uL   nRBC 0.0 0.0 - 0.2 %   Neutrophils Relative % 68 %   Neutro Abs 10.6 (H) 1.7 - 7.7 K/uL   Lymphocytes Relative 24 %   Lymphs Abs 3.8 0.7 - 4.0 K/uL   Monocytes Relative 6 %   Monocytes Absolute 0.9 0.1 - 1.0 K/uL   Eosinophils Relative 1 %   Eosinophils Absolute 0.2 0.0 - 0.5 K/uL   Basophils Relative 0 %   Basophils  Absolute 0.1 0.0 - 0.1 K/uL   Immature Granulocytes 1 %   Abs Immature Granulocytes 0.15 (H) 0.00 - 0.07 K/uL    Comment: Performed at [facility], [address], [place], [place] [address]  Comprehensive metabolic panel     Status: Abnormal   Collection Time: 02/11/23  9:51 PM  Result Value Ref Range   Sodium 133 (L) 135 - 145 mmol/L   Potassium 4.3 3.5 - 5.1 mmol/L   Chloride 98 98 - 111 mmol/L   CO2 26 22 - 32 mmol/L   Glucose, Bld 143 (H) 70 - 99 mg/dL    Comment: Glucose reference range applies only to samples taken after fasting for at least 8 hours.   BUN 25 (H) 8 - 23 mg/dL   Creatinine, Ser 8.46 0.61 - 1.24 mg/dL   Calcium 8.9 8.9 - 96.2 mg/dL   Total Protein 7.5 6.5 - 8.1 g/dL   Albumin 2.8 (L) 3.5 - 5.0 g/dL   AST 30 15 - 41 U/L   ALT 35 0 - 44 U/L   Alkaline Phosphatase 77 38 - 126 U/L   Total Bilirubin 0.4 0.3 - 1.2 mg/dL   GFR, Estimated >95 >28 mL/min    Comment: (NOTE) Calculated using the CKD-EPI Creatinine Equation (2021)    Anion gap 9 5 - 15    Comment: Performed at [facility], [address], [place], [place] [address]   No results found.  Pending Labs Unresulted Labs (From admission, onward)     Start     Ordered   02/12/23 0500  Comprehensive metabolic panel  Daily,   R      02/12/23 0020   02/12/23 0500  CBC  Daily,   R      02/12/23 0020            Vitals/Pain Today's Vitals   02/11/23 1926 02/11/23 1936 02/11/23 1939 02/11/23 2208  BP: 120/69 110/66  137/84  Pulse: (!) 101 (!) 108  96  Resp: 20 18  (!) 25  Temp: 97.7 F (36.5 C) 98.4 F (36.9 C)  98.2 F (36.8 C)  TempSrc:  Oral  Oral  SpO2: 97% 98%  100%  Weight:   66.7 kg   Height:   5\' 8"  (1.727 m)   PainSc:   0-No pain     Isolation Precautions No active isolations  Medications Medications  sodium chloride 0.9 % bolus 1,000 mL (has no administration in time range)  ondansetron (ZOFRAN) injection 4 mg (has no administration in time  range)  heparin  injection 5,000 Units (has no administration in time range)  lactated ringers infusion (has no administration in time range)  sodium chloride flush (NS) 0.9 % injection 3 mL (has no administration in time range)  sodium chloride flush (NS) 0.9 % injection 3 mL (has no administration in time range)  0.9 %  sodium chloride infusion (has no administration in time range)  acetaminophen (TYLENOL) tablet 650 mg (has no administration in time range)    Or  acetaminophen (TYLENOL) suppository 650 mg (has no administration in time range)  ondansetron (ZOFRAN) tablet 4 mg (has no administration in time range)    Or  ondansetron (ZOFRAN) injection 4 mg (has no administration in time range)  hydrALAZINE (APRESOLINE) injection 10 mg (has no administration in time range)  metoprolol tartrate (LOPRESSOR) injection 5 mg (has no administration in time range)    Mobility walks     Focused Assessments Cardiac Assessment Handoff:    Lab Results  Component Value Date   CKTOTAL 175 05/12/2013   CKMB 2.4 05/12/2013   TROPONINI <0.03 12/08/2018   Lab Results  Component Value Date   DDIMER 7.02 (H) 02/01/2023   Does the Patient currently have chest pain? No    R Recommendations: See Admitting Provider Note  Report given to:   Additional Notes:

## 2023-02-12 NOTE — Progress Notes (Signed)
Progress Note    Jon Bray  FAO:[id number] DOB: 31-May-1941  DOA: 02/11/2023 PCP: Danella Penton, MD      Brief Narrative:    Medical records reviewed and are as summarized below:  Jon Bray is a 82 y.o. male  with medical history significant of esophageal stenosis s/p Cortrack NG-tube placed on 02/02/2023, insulin-dependent DM type II, essential hypertension, CAD, paroxysmal atrial fibrillation-not on anticoagulation, gallstone pancreatitis status post cholecystectomy May 2024, chronic anemia, chronic hyponatremia and moderate malnutrition presented to emergency department due to vomiting and displaced NG tube.  He vomited while taking his medication.  In the ED patient was unable to tolerate more than 1-2 sips of liquid without vomiting.   Patient had recent hospital admission 01/28/2023 to 02/08/2023 due to AKI, dehydration, acute pancreatitis.  Patient underwent EGD 7/29 showed esophageal stenosis, hiatal hernia and ulcerated gastric fundus with congested mucosa.  As patient did not tolerated oral diet well feeding tube Cortrack placed on 7/31 and to continue liquid diet as tolerated.  Patient was discharged [facility] with [facility] and supposed to be follow-up with  GI on 02/22/2023.       Assessment/Plan:   Principal Problem:   Encounter for feeding tube placement Active Problems:   Displaced nasogastric feeding tube   Esophageal stenosis   Chronic pancreatitis (HCC)   Diabetes type 2, controlled (HCC)   Leukocytosis   Essential hypertension   History of CAD (coronary artery disease)   Atrial fibrillation, chronic -not on coagulation (HCC)   Chronic disease anemia    Body mass index is 22.35 kg/m.   Displaced NG tube Esophageal stenosis (EGD on 01/31/2023) s/p NG tube placement on 03/05/2023 Poor oral tolerance of food Nausea and vomiting Continue IV fluids.  IR has been consulted for NG tube placement. Follow-up with gastroenterologist for further  recommendations. Case discussed with Dr. Leone Payor Dietitian to assist with enteral nutrition orders at discharge.  Recent discharge from the hospital for acute pancreatitis on 02/08/2023 Leukocytosis secondary to recent pancreatitis History of gallstone pancreatitis s/p status post cholecystectomy in May 2024. Leukocytosis is improving.    Insulin-dependent DM type II A1c 5.8 checked on 11/1718 24 Currently n.p.o. holding home insulin regimen.   Chronic atrial fibrillation not on anticoagulation CHA2DS2-VASc score = 2 He is on aspirin at home. IV metoprolol as needed for A-fib RVR.    Chronic normocytic anemia Slight drop in H&H.  Continue to monitor.   History of CAD -At home is on aspirin 81 mg well.           Diet Order             Diet NPO time specified  Diet effective now                            Consultants: Gastroenterologist Interventional radiologist  Procedures: Plan for NG tube placement    Medications:    heparin  5,000 Units Subcutaneous Q8H   sodium chloride flush  3 mL Intravenous Q12H   Continuous Infusions:  sodium chloride     lactated ringers 75 mL/hr at 02/12/23 0658     Anti-infectives (From admission, onward)    None              Family Communication/Anticipated D/C date and plan/Code Status   DVT prophylaxis: heparin injection 5,000 Units Start: 02/12/23 0600 SCDs Start: 02/12/23 0021     Code Status: Full  Code  Family Communication: Plan discussed with his wife at the bedside Disposition Plan: Plan to discharge home in 1 to 2 days   Status is: Inpatient Remains inpatient appropriate because: Needs NG tube placement, poor oral intake       Subjective:   Interval events noted.  No nausea, vomiting or abdominal pain.  He complains of constipation and wife requested a laxative.  Objective:    Vitals:   02/11/23 2345 02/12/23 0147 02/12/23 0504 02/12/23 0724  BP: 130/85 119/83  126/79 (!) 103/59  Pulse: 93 81 89 87  Resp: (!) 26 18 18 16   Temp:  97.9 F (36.6 C) (!) 97.5 F (36.4 C) (!) 97.5 F (36.4 C)  TempSrc:  Oral Oral   SpO2: 100% 99% 94% 97%  Weight:      Height:       No data found.   Intake/Output Summary (Last 24 hours) at 02/12/2023 1013 Last data filed at 02/12/2023 0658 Gross per 24 hour  Intake 382.71 ml  Output --  Net 382.71 ml   Filed Weights   02/11/23 1939  Weight: 66.7 kg    Exam:  GEN: NAD SKIN: Warm and dry EYES: No pallor or icterus ENT: MMM CV: RRR PULM: CTA B ABD: soft, ND, NT, +BS CNS: AAO x 3, non focal EXT: No edema or tenderness        Data Reviewed:   I have personally reviewed following labs and imaging studies:  Labs: Labs show the following:   Basic Metabolic Panel: Recent Labs  Lab 02/06/23 0129 02/07/23 0840 02/07/23 2359 02/11/23 2151 02/12/23 0406  NA 131* 130* 132* 133* 135  K 4.2 4.6 4.2 4.3 4.0  CL 99 98 100 98 97*  CO2 22 19* 22 26 22   GLUCOSE 216* 195* 278* 143* 95  BUN 20 20 23  25* 22  CREATININE 0.80 0.78 0.95 0.92 0.80  CALCIUM 8.1* 8.1* 8.2* 8.9 8.5*  MG 2.0 2.0 1.9  --   --   PHOS 3.1 4.0 3.3  --   --    GFR Estimated Creatinine Clearance: 66 mL/min (by C-G formula based on SCr of 0.8 mg/dL). Liver Function Tests: Recent Labs  Lab 02/06/23 0129 02/07/23 0840 02/07/23 2359 02/11/23 2151 02/12/23 0406  AST  --  31  --  30 27  ALT  --  26  --  35 29  ALKPHOS  --  68  --  77 61  BILITOT  --  0.8  --  0.4 0.5  PROT  --  6.5  --  7.5 6.5  ALBUMIN 2.3* 2.3* 2.3* 2.8* 2.4*   No results for input(s): "LIPASE", "AMYLASE" in the last 168 hours. No results for input(s): "AMMONIA" in the last 168 hours. Coagulation profile No results for input(s): "INR", "PROTIME" in the last 168 hours.  CBC: Recent Labs  Lab 02/06/23 0129 02/07/23 0840 02/07/23 2359 02/11/23 2151 02/12/23 0406  WBC 15.3* 14.9* 15.3* 15.7* 12.1*  NEUTROABS 10.6* 10.4*  --  10.6*  --   HGB  11.3* 11.0* 10.8* 11.8* 10.5*  HCT 33.5* 33.6* 33.0* 35.8* 32.9*  MCV 85.7 84.4 86.4 86.9 85.7  PLT 279 280 278 377 299   Cardiac Enzymes: No results for input(s): "CKTOTAL", "CKMB", "CKMBINDEX", "TROPONINI" in the last 168 hours. BNP (last 3 results) No results for input(s): "PROBNP" in the last 8760 hours. CBG: Recent Labs  Lab 02/07/23 0032 02/07/23 0444 02/07/23 0828 02/07/23 2128 02/08/23 0742  GLUCAP 179*  169* 175* 168* 179*   D-Dimer: No results for input(s): "DDIMER" in the last 72 hours. Hgb A1c: No results for input(s): "HGBA1C" in the last 72 hours. Lipid Profile: No results for input(s): "CHOL", "HDL", "LDLCALC", "TRIG", "CHOLHDL", "LDLDIRECT" in the last 72 hours. Thyroid function studies: No results for input(s): "TSH", "T4TOTAL", "T3FREE", "THYROIDAB" in the last 72 hours.  Invalid input(s): "FREET3" Anemia work up: No results for input(s): "VITAMINB12", "FOLATE", "FERRITIN", "TIBC", "IRON", "RETICCTPCT" in the last 72 hours. Sepsis Labs: Recent Labs  Lab 02/07/23 0840 02/07/23 2359 02/11/23 2151 02/12/23 0406  WBC 14.9* 15.3* 15.7* 12.1*    Microbiology No results found for this or any previous visit (from the past 240 hour(s)).  Procedures and diagnostic studies:  No results found.             LOS: 0 days      [facility]   Pager on [email or web address]. If 7PM-7AM, please contact night-coverage at [email or web address]     02/12/2023, 10:13 AM

## 2023-02-13 ENCOUNTER — Inpatient Hospital Stay (HOSPITAL_COMMUNITY): Payer: HMO

## 2023-02-13 DIAGNOSIS — K859 Acute pancreatitis without necrosis or infection, unspecified: Secondary | ICD-10-CM

## 2023-02-13 DIAGNOSIS — R112 Nausea with vomiting, unspecified: Secondary | ICD-10-CM | POA: Diagnosis not present

## 2023-02-13 DIAGNOSIS — K851 Biliary acute pancreatitis without necrosis or infection: Secondary | ICD-10-CM | POA: Diagnosis not present

## 2023-02-13 DIAGNOSIS — K863 Pseudocyst of pancreas: Secondary | ICD-10-CM | POA: Diagnosis not present

## 2023-02-13 DIAGNOSIS — Z4659 Encounter for fitting and adjustment of other gastrointestinal appliance and device: Secondary | ICD-10-CM | POA: Diagnosis not present

## 2023-02-13 LAB — CBC
HCT: 33.1 % — ABNORMAL LOW (ref 39.0–52.0)
Hemoglobin: 10.6 g/dL — ABNORMAL LOW (ref 13.0–17.0)
MCH: 27 pg (ref 26.0–34.0)
MCHC: 32 g/dL (ref 30.0–36.0)
MCV: 84.4 fL (ref 80.0–100.0)
Platelets: 323 10*3/uL (ref 150–400)
RBC: 3.92 MIL/uL — ABNORMAL LOW (ref 4.22–5.81)
RDW: 15 % (ref 11.5–15.5)
WBC: 10.7 10*3/uL — ABNORMAL HIGH (ref 4.0–10.5)
nRBC: 0 % (ref 0.0–0.2)

## 2023-02-13 LAB — COMPREHENSIVE METABOLIC PANEL WITH GFR
ALT: 35 U/L (ref 0–44)
AST: 32 U/L (ref 15–41)
Albumin: 2.5 g/dL — ABNORMAL LOW (ref 3.5–5.0)
Alkaline Phosphatase: 67 U/L (ref 38–126)
Anion gap: 11 (ref 5–15)
BUN: 19 mg/dL (ref 8–23)
CO2: 23 mmol/L (ref 22–32)
Calcium: 8.4 mg/dL — ABNORMAL LOW (ref 8.9–10.3)
Chloride: 100 mmol/L (ref 98–111)
Creatinine, Ser: 0.8 mg/dL (ref 0.61–1.24)
GFR, Estimated: >60 mL/min (ref >60)
Glucose, Bld: 83 mg/dL (ref 70–99)
Potassium: 3.7 mmol/L (ref 3.5–5.1)
Sodium: 134 mmol/L — ABNORMAL LOW (ref 135–145)
Total Bilirubin: 1 mg/dL (ref 0.3–1.2)
Total Protein: 6.5 g/dL (ref 6.5–8.1)

## 2023-02-13 LAB — LIPASE, BLOOD: Lipase: 262 U/L — ABNORMAL HIGH (ref 11–51)

## 2023-02-13 MED ORDER — BISACODYL 10 MG RE SUPP: 10.0000 mg | Freq: Once | RECTAL | Status: DC

## 2023-02-13 MED ORDER — ONDANSETRON HCL 4 MG/2ML IJ SOLN
4.0000 mg | Freq: Four times a day (QID) | INTRAMUSCULAR | Status: DC
  Administered 2023-02-13 – 2023-02-15 (×6): 4 mg via INTRAVENOUS
  Filled 2023-02-13 (×9): qty 2

## 2023-02-13 MED ORDER — LACTATED RINGERS IV SOLN: INTRAVENOUS | Status: AC

## 2023-02-13 MED ORDER — SODIUM CHLORIDE 0.9 % IV SOLN
12.5000 mg | Freq: Four times a day (QID) | INTRAVENOUS | Status: DC | PRN
  Administered 2023-02-13 – 2023-02-16 (×5): 12.5 mg via INTRAVENOUS
  Filled 2023-02-13 (×2): qty 12.5
  Filled 2023-02-13: qty 0.5
  Filled 2023-02-13: qty 12.5
  Filled 2023-02-13: qty 0.5
  Filled 2023-02-13: qty 12.5

## 2023-02-13 NOTE — Progress Notes (Signed)
Daily Progress Note  DOA: 02/11/2023 Hospital Day: 3  Chief Complaint: severe pancreatitis    ASSESSMENT & PLAN   Brief Narrative:  82 y.o. year old male with severe gallstone pancreatitis, s/p ERCP with sphincterotomy and stone removal late May .  Severe pancreatitis has been complicated by multiple fluid collections. He has had difficulty with nausea/vomiting and an inability to maintain nutrition necessitating enteral feedings.  He his currently admitted after cortrak was dislodged secondary to vomiting which occurs when he has to take oral medications with water or broth --Repeat CT AP yesterday showing interval increase in the size of the pancreatic pseudocysts extending into the posterior wall of the stomach. --Vomiting this am. Phenergan is helping. Fluid collections will need to be drained to help with what appears to be GOO from the enlarging fluid collections.  Dr. Sharol Roussel will see soon. If he doesn't have upcoming availability then will ask IR to evaluate.  --Needs Cortrak replaced tomorrow. Wife asking about TNA but ideally will just wait until cortrak feedings resumed tomorrow. Would think he could still be able to tolerate small bowel feedings  -Continue IVF  -Continue anti-emetics  Suspicious bladder wall nodule. CT AP yesterday showing an 8 mm enhancing nodule in the right posterior bladder wall adjacent to the ureterovesical junction suspicious for a urothelial neoplasm.    Subjective   Resting comfortably now but has been having N/V this am. No significant abdominal pain    Objective   Recent Labs    02/11/23 2151 02/12/23 0406 02/13/23 0044  WBC 15.7* 12.1* 10.7*  HGB 11.8* 10.5* 10.6*  HCT 35.8* 32.9* 33.1*  PLT 377 299 323   BMET Recent Labs    02/11/23 2151 02/12/23 0406 02/13/23 0044  NA 133* 135 134*  K 4.3 4.0 3.7  CL 98 97* 100  CO2 26 22 23   GLUCOSE 143* 95 83  BUN 25* 22 19  CREATININE 0.92 0.80 0.80  CALCIUM 8.9 8.5* 8.4*    LFT Recent Labs    02/13/23 0044  PROT 6.5  ALBUMIN 2.5*  AST 32  ALT 35  ALKPHOS 67  BILITOT 1.0   PT/INR No results for input(s): "LABPROT", "INR" in the last 72 hours.   Imaging:  CT ABDOMEN PELVIS W CONTRAST CLINICAL DATA:  Vomiting.  Pancreatitis.  EXAM: CT ABDOMEN AND PELVIS WITH CONTRAST  TECHNIQUE: Multidetector CT imaging of the abdomen and pelvis was performed using the standard protocol following bolus administration of intravenous contrast.  RADIATION DOSE REDUCTION: This exam was performed according to the departmental dose-optimization program which includes automated exposure control, adjustment of the mA and/or kV according to patient size and/or use of iterative reconstruction technique.  CONTRAST:  75mL OMNIPAQUE IOHEXOL 350 MG/ML SOLN  COMPARISON:  CT abdomen pelvis dated 01/28/2023.  FINDINGS: Lower chest: Minimal left lung base atelectasis/scarring. The visualized lung bases are otherwise clear. There is coronary vascular calcification.  No intra-abdominal free air.  Trace free fluid within the pelvis.  Hepatobiliary: The liver is unremarkable. No biliary dilatation. Cholecystectomy. Mild pneumobilia.  Pancreas: Inflammatory changes and stranding of the fat surrounding the head and uncinate process of pancreas in keeping with provided history of pancreatitis. Multiple loculated fluid collection predominantly in the head and uncinate process of the pancreas in keeping with pseudocysts. The largest collection measures approximately 6.5 x 5.5 cm, previously 4.9 x 3.5 cm. Overall there has been interval increase in the size of the peripancreatic abscesses or pseudocysts since the prior  CT. There is loss of fat plane between the pancreatic pseudocyst and posterior wall of the stomach. There is extension of the pancreatic pseudocyst into the posterior gastric wall. Similar appearance of a 2.2 cm hypodense lesion in the tail of the  pancreas.  Spleen: Normal in size without focal abnormality.  Adrenals/Urinary Tract: The adrenal glands are unremarkable. There is no hydronephrosis on either side. There is symmetric enhancement and excretion of contrast by both kidneys. The visualized ureters appear unremarkable. There is an 8 mm enhancing nodule in the right posterior bladder wall adjacent to the ureterovesical junction suspicious for a urothelial neoplasm. Further evaluation with cystoscopy is recommended.  Stomach/Bowel: There is severe sigmoid diverticulosis without active inflammatory changes. There is no bowel obstruction. The appendix is normal.  Vascular/Lymphatic: Advanced aortoiliac atherosclerotic disease. The IVC is unremarkable. No portal venous gas. There is no adenopathy.  Reproductive: The prostate and seminal vesicles are grossly unremarkable. No pelvic mass.  Other: None  Musculoskeletal: Osteopenia with degenerative changes of the spine. No acute osseous pathology.  IMPRESSION: 1. Interval increase in the size of the pancreatic pseudocysts since the prior CT extending into the posterior wall of the stomach. 2. Severe sigmoid diverticulosis. No bowel obstruction. Normal appendix. 3. An 8 mm enhancing nodule in the right posterior bladder wall adjacent to the ureterovesical junction suspicious for a urothelial neoplasm. Further evaluation with cystoscopy is recommended. 4.  Aortic Atherosclerosis (ICD10-I70.0).  Electronically Signed   By: Elgie Collard M.D.   On: 02/12/2023 16:51     Scheduled inpatient medications:   bisacodyl  10 mg Rectal Once   heparin  5,000 Units Subcutaneous Q8H   sodium chloride flush  3 mL Intravenous Q12H   Continuous inpatient infusions:   sodium chloride     lactated ringers     promethazine (PHENERGAN) injection (IM or IVPB) 12.5 mg (02/13/23 0944)   PRN inpatient medications: sodium chloride, acetaminophen **OR** acetaminophen, hydrALAZINE,  metoprolol tartrate, ondansetron **OR** ondansetron (ZOFRAN) IV, promethazine (PHENERGAN) injection (IM or IVPB), sodium chloride flush  Vital signs in last 24 hours: Temp:  [98.1 F (36.7 C)-98.2 F (36.8 C)] 98.1 F (36.7 C) (08/11 0734) Pulse Rate:  [74-88] 87 (08/11 0734) Resp:  [16-18] 16 (08/11 0734) BP: (129-133)/(78-86) 130/86 (08/11 0734) SpO2:  [96 %-98 %] 96 % (08/11 0734) Last BM Date : 02/11/23  Intake/Output Summary (Last 24 hours) at 02/13/2023 1221 Last data filed at 02/13/2023 0800 Gross per 24 hour  Intake 0 ml  Output --  Net 0 ml    Intake/Output from previous day: No intake/output data recorded. Intake/Output this shift: No intake/output data recorded.   Physical Exam:  General:  NAD Heart:  Regular rate and rhythm.  Pulmonary: Normal respiratory effort Abdomen: Soft, nondistended, nontender. Hypoactive bowel sounds Neurologic: oriented Psych: Pleasant. Cooperative. Insight appears normal.    Principal Problem:   Encounter for feeding tube placement Active Problems:   Diabetes type 2, controlled (HCC)   Displaced nasogastric feeding tube   Esophageal stenosis   Chronic pancreatitis (HCC)   Leukocytosis   Essential hypertension   History of CAD (coronary artery disease)   Atrial fibrillation, chronic -not on coagulation (HCC)   Chronic disease anemia     LOS: 1 day   Willette Cluster ,NP 02/13/2023, 12:21 PM

## 2023-02-13 NOTE — Progress Notes (Signed)
Progress Note    Jon Bray  VHQ:[id number] DOB: [birth year]  DOA: 02/11/2023 PCP: Danella Penton, MD      Brief Narrative:    Medical records reviewed and are as summarized below:  Jon Bray is a 82 y.o. male  with medical history significant of esophageal stenosis s/p Cortrack NG-tube placed on 02/02/2023, insulin-dependent DM type II, essential hypertension, CAD, paroxysmal atrial fibrillation-not on anticoagulation, gallstone pancreatitis status post cholecystectomy May 2024, chronic anemia, chronic hyponatremia and moderate malnutrition presented to emergency department due to vomiting and displaced NG tube.  He vomited while taking his medication.  In the ED patient was unable to tolerate more than 1-2 sips of liquid without vomiting.   Patient had recent hospital admission 01/28/2023 to 02/08/2023 due to AKI, dehydration, acute pancreatitis.  Patient underwent EGD 7/29 showed esophageal stenosis, hiatal hernia and ulcerated gastric fundus with congested mucosa.  As patient did not tolerated oral diet well feeding tube Cortrack placed on 7/31 and to continue liquid diet as tolerated.  Patient was discharged home with [facility] and supposed to be follow-up with  GI on 02/22/2023.       Assessment/Plan:   Principal Problem:   Encounter for feeding tube placement Active Problems:   Displaced nasogastric feeding tube   Esophageal stenosis   Chronic pancreatitis (HCC)   Diabetes type 2, controlled (HCC)   Leukocytosis   Essential hypertension   History of CAD (coronary artery disease)   Atrial fibrillation, chronic -not on coagulation (HCC)   Chronic disease anemia    Body mass index is 22.35 kg/m.   Displaced NG tube Esophageal stenosis (EGD on 01/31/2023) s/p NG tube placement on 03/05/2023 Poor oral tolerance of food Nausea and vomiting No bowel obstruction on CT abdomen pelvis Continue IV fluids for hydration.  Added IV Phenergan to Zofran for refractory nausea  and vomiting.  IR has been consulted for NG tube placement. Follow-up with gastroenterologist for further recommendations. Dietitian to assist with enteral nutrition orders at discharge.   Recent discharge from the hospital for acute pancreatitis on 02/08/2023 Pancreatic pseudocysts History of gallstone pancreatitis s/p status post cholecystectomy in May 2024. Leukocytosis has improved CT abdomen and pelvis on 02/12/2023 showed interval increase in size of the pancreatic pseudocysts. It is not clear if this is causing recurrent vomiting. Case discussed with Dr. Leone Payor, gastroenterologist. He has reached out to Dr. Meridee Score, interventional gastroenterologist, to assist with management.    Insulin-dependent DM type II A1c 5.8 checked on 5/172024 Insulin glargine is on hold    Chronic atrial fibrillation not on anticoagulation CHA2DS2-VASc score = 2 He is on aspirin at home. IV metoprolol as needed for A-fib RVR.    Chronic normocytic anemia H&H is stable   History of CAD -At home is on aspirin 81 mg well.    8 mm nodule in the right posterior bladder wall Recommended outpatient follow-up with urologist for further evaluation.       Diet Order             Diet NPO time specified  Diet effective now                            Consultants: Gastroenterologist Interventional radiologist  Procedures: Plan for NG tube placement    Medications:    bisacodyl  10 mg Rectal Once   heparin  5,000 Units Subcutaneous Q8H   sodium chloride flush  3  mL Intravenous Q12H   Continuous Infusions:  sodium chloride     lactated ringers 75 mL/hr at 02/12/23 1354   promethazine (PHENERGAN) injection (IM or IVPB) 12.5 mg (02/13/23 0944)     Anti-infectives (From admission, onward)    None              Family Communication/Anticipated D/C date and plan/Code Status   DVT prophylaxis: heparin injection 5,000 Units Start: 02/12/23 0600 SCDs Start:  02/12/23 0021     Code Status: Full Code  Family Communication: Plan discussed with his wife at the bedside Disposition Plan: Plan to discharge home   Status is: Inpatient Remains inpatient appropriate because: Needs NG tube placement, poor oral intake and vomiting       Subjective:   Interval events noted.  He complains of constipation and vomiting.  He has vomited about 4 times today.  No abdominal pain.  His wife was at the bedside.  Objective:    Vitals:   02/12/23 1614 02/12/23 1946 02/13/23 0441 02/13/23 0734  BP: 130/84 129/78 133/79 130/86  Pulse: 88 74 85 87  Resp: 16 18 18 16   Temp: 98.2 F (36.8 C) 98.2 F (36.8 C) 98.1 F (36.7 C) 98.1 F (36.7 C)  TempSrc: Oral Oral Oral Oral  SpO2: 98% 97% 97% 96%  Weight:      Height:       No data found.   Intake/Output Summary (Last 24 hours) at 02/13/2023 1145 Last data filed at 02/13/2023 0800 Gross per 24 hour  Intake 0 ml  Output --  Net 0 ml   Filed Weights   02/11/23 1939  Weight: 66.7 kg    Exam:   GEN: NAD SKIN: No rash EYES: EOMI ENT: MMM CV: RRR PULM: CTA B ABD: soft, ND, NT, +BS CNS: AAO x 3, non focal EXT: No edema or tenderness      Data Reviewed:   I have personally reviewed following labs and imaging studies:  Labs: Labs show the following:   Basic Metabolic Panel: Recent Labs  Lab 02/07/23 0840 02/07/23 2359 02/11/23 2151 02/12/23 0406 02/13/23 0044  NA 130* 132* 133* 135 134*  K 4.6 4.2 4.3 4.0 3.7  CL 98 100 98 97* 100  CO2 19* 22 26 22 23   GLUCOSE 195* 278* 143* 95 83  BUN 20 23 25* 22 19  CREATININE 0.78 0.95 0.92 0.80 0.80  CALCIUM 8.1* 8.2* 8.9 8.5* 8.4*  MG 2.0 1.9  --   --   --   PHOS 4.0 3.3  --   --   --    GFR Estimated Creatinine Clearance: 66 mL/min (by C-G formula based on SCr of 0.8 mg/dL). Liver Function Tests: Recent Labs  Lab 02/07/23 0840 02/07/23 2359 02/11/23 2151 02/12/23 0406 02/13/23 0044  AST 31  --  30 27 32  ALT 26  --   35 29 35  ALKPHOS 68  --  77 61 67  BILITOT 0.8  --  0.4 0.5 1.0  PROT 6.5  --  7.5 6.5 6.5  ALBUMIN 2.3* 2.3* 2.8* 2.4* 2.5*   No results for input(s): "LIPASE", "AMYLASE" in the last 168 hours. No results for input(s): "AMMONIA" in the last 168 hours. Coagulation profile No results for input(s): "INR", "PROTIME" in the last 168 hours.  CBC: Recent Labs  Lab 02/07/23 0840 02/07/23 2359 02/11/23 2151 02/12/23 0406 02/13/23 0044  WBC 14.9* 15.3* 15.7* 12.1* 10.7*  NEUTROABS 10.4*  --  10.6*  --   --  HGB 11.0* 10.8* 11.8* 10.5* 10.6*  HCT 33.6* 33.0* 35.8* 32.9* 33.1*  MCV 84.4 86.4 86.9 85.7 84.4  PLT 280 278 377 299 323   Cardiac Enzymes: No results for input(s): "CKTOTAL", "CKMB", "CKMBINDEX", "TROPONINI" in the last 168 hours. BNP (last 3 results) No results for input(s): "PROBNP" in the last 8760 hours. CBG: Recent Labs  Lab 02/07/23 0032 02/07/23 0444 02/07/23 0828 02/07/23 2128 02/08/23 0742  GLUCAP 179* 169* 175* 168* 179*   D-Dimer: No results for input(s): "DDIMER" in the last 72 hours. Hgb A1c: No results for input(s): "HGBA1C" in the last 72 hours. Lipid Profile: No results for input(s): "CHOL", "HDL", "LDLCALC", "TRIG", "CHOLHDL", "LDLDIRECT" in the last 72 hours. Thyroid function studies: No results for input(s): "TSH", "T4TOTAL", "T3FREE", "THYROIDAB" in the last 72 hours.  Invalid input(s): "FREET3" Anemia work up: No results for input(s): "VITAMINB12", "FOLATE", "FERRITIN", "TIBC", "IRON", "RETICCTPCT" in the last 72 hours. Sepsis Labs: Recent Labs  Lab 02/07/23 2359 02/11/23 2151 02/12/23 0406 02/13/23 0044  WBC 15.3* 15.7* 12.1* 10.7*    Microbiology No results found for this or any previous visit (from the past 240 hour(s)).  Procedures and diagnostic studies:  CT ABDOMEN PELVIS W CONTRAST  Result Date: 02/12/2023 CLINICAL DATA:  Vomiting.  Pancreatitis. EXAM: CT ABDOMEN AND PELVIS WITH CONTRAST TECHNIQUE: Multidetector CT  imaging of the abdomen and pelvis was performed using the standard protocol following bolus administration of intravenous contrast. RADIATION DOSE REDUCTION: This exam was performed according to the departmental dose-optimization program which includes automated exposure control, adjustment of the mA and/or kV according to patient size and/or use of iterative reconstruction technique. CONTRAST:  75mL OMNIPAQUE IOHEXOL 350 MG/ML SOLN COMPARISON:  CT abdomen pelvis dated 01/28/2023. FINDINGS: Lower chest: Minimal left lung base atelectasis/scarring. The visualized lung bases are otherwise clear. There is coronary vascular calcification. No intra-abdominal free air.  Trace free fluid within the pelvis. Hepatobiliary: The liver is unremarkable. No biliary dilatation. Cholecystectomy. Mild pneumobilia. Pancreas: Inflammatory changes and stranding of the fat surrounding the head and uncinate process of pancreas in keeping with provided history of pancreatitis. Multiple loculated fluid collection predominantly in the head and uncinate process of the pancreas in keeping with pseudocysts. The largest collection measures approximately 6.5 x 5.5 cm, previously 4.9 x 3.5 cm. Overall there has been interval increase in the size of the peripancreatic abscesses or pseudocysts since the prior CT. There is loss of fat plane between the pancreatic pseudocyst and posterior wall of the stomach. There is extension of the pancreatic pseudocyst into the posterior gastric wall. Similar appearance of a 2.2 cm hypodense lesion in the tail of the pancreas. Spleen: Normal in size without focal abnormality. Adrenals/Urinary Tract: The adrenal glands are unremarkable. There is no hydronephrosis on either side. There is symmetric enhancement and excretion of contrast by both kidneys. The visualized ureters appear unremarkable. There is an 8 mm enhancing nodule in the right posterior bladder wall adjacent to the ureterovesical junction suspicious  for a urothelial neoplasm. Further evaluation with cystoscopy is recommended. Stomach/Bowel: There is severe sigmoid diverticulosis without active inflammatory changes. There is no bowel obstruction. The appendix is normal. Vascular/Lymphatic: Advanced aortoiliac atherosclerotic disease. The IVC is unremarkable. No portal venous gas. There is no adenopathy. Reproductive: The prostate and seminal vesicles are grossly unremarkable. No pelvic mass. Other: None Musculoskeletal: Osteopenia with degenerative changes of the spine. No acute osseous pathology. IMPRESSION: 1. Interval increase in the size of the pancreatic pseudocysts since the prior CT extending  into the posterior wall of the stomach. 2. Severe sigmoid diverticulosis. No bowel obstruction. Normal appendix. 3. An 8 mm enhancing nodule in the right posterior bladder wall adjacent to the ureterovesical junction suspicious for a urothelial neoplasm. Further evaluation with cystoscopy is recommended. 4.  Aortic Atherosclerosis (ICD10-I70.0). Electronically Signed   By: Elgie Collard M.D.   On: 02/12/2023 16:51               LOS: 1 day      [facility]   Pager on [email or web address]. If 7PM-7AM, please contact night-coverage at [email or web address]     02/13/2023, 11:45 AM

## 2023-02-13 NOTE — Plan of Care (Signed)

## 2023-02-14 ENCOUNTER — Inpatient Hospital Stay (HOSPITAL_COMMUNITY): Payer: HMO

## 2023-02-14 DIAGNOSIS — Z4659 Encounter for fitting and adjustment of other gastrointestinal appliance and device: Secondary | ICD-10-CM | POA: Diagnosis not present

## 2023-02-14 DIAGNOSIS — K311 Adult hypertrophic pyloric stenosis: Secondary | ICD-10-CM | POA: Diagnosis not present

## 2023-02-14 DIAGNOSIS — K851 Biliary acute pancreatitis without necrosis or infection: Secondary | ICD-10-CM | POA: Diagnosis not present

## 2023-02-14 LAB — GLUCOSE, CAPILLARY
Glucose-Capillary: 117 mg/dL — ABNORMAL HIGH (ref 70–99)
Glucose-Capillary: 136 mg/dL — ABNORMAL HIGH (ref 70–99)
Glucose-Capillary: 149 mg/dL — ABNORMAL HIGH (ref 70–99)

## 2023-02-14 MED ORDER — IOHEXOL 300 MG/ML  SOLN
50.0000 mL | Freq: Once | INTRAMUSCULAR | Status: AC | PRN
  Administered 2023-02-14: 50 mL

## 2023-02-14 MED ORDER — LACTATED RINGERS IV SOLN: INTRAVENOUS | Status: AC

## 2023-02-14 MED ORDER — LIDOCAINE VISCOUS HCL 2 % MT SOLN
3.0000 mL | Freq: Once | OROMUCOSAL | Status: AC
  Administered 2023-02-14: 3 mL via OROMUCOSAL

## 2023-02-14 MED ORDER — PROSOURCE TF20 ENFIT COMPATIBL EN LIQD: 60.0000 mL | Freq: Every day | ENTERAL | Status: DC

## 2023-02-14 MED ORDER — MORPHINE SULFATE (PF) 2 MG/ML IV SOLN
2.0000 mg | INTRAVENOUS | Status: DC | PRN
  Administered 2023-02-15 – 2023-02-26 (×6): 2 mg via INTRAVENOUS
  Filled 2023-02-14 (×6): qty 1

## 2023-02-14 MED ORDER — MORPHINE SULFATE (PF) 2 MG/ML IV SOLN
2.0000 mg | Freq: Once | INTRAVENOUS | Status: AC
  Administered 2023-02-14: 2 mg via INTRAVENOUS
  Filled 2023-02-14: qty 1

## 2023-02-14 MED ORDER — METOCLOPRAMIDE HCL 5 MG/ML IJ SOLN
10.0000 mg | Freq: Once | INTRAMUSCULAR | Status: AC
  Administered 2023-02-14: 10 mg via INTRAVENOUS
  Filled 2023-02-14: qty 2

## 2023-02-14 MED ORDER — OSMOLITE 1.5 CAL PO LIQD
1000.0000 mL | ORAL | Status: DC
  Filled 2023-02-14 (×2): qty 1000

## 2023-02-14 NOTE — Progress Notes (Signed)
[facility] night cross cover note:   I was contacted by RN requesting clarification on the patient's IV fluids.  RN conveys that placement of core track was unsuccessful earlier today, and therefore the tube feeds have not yet been started and the patient remains n.p.o. at this time.  Leading up to attempts at core track placement, the patient had been on lactated Ringer's running at 75 cc/h, but this order has now expired.  I subsequently reordered lactated Ringer's at 75 cc/h x 12 additional hours.     Newton Pigg, DO Hospitalist

## 2023-02-14 NOTE — Progress Notes (Signed)
PROGRESS NOTE  Jon Bray YNW:[id number] DOB: [birth year] DOA: 02/11/2023 PCP: Danella Penton, MD   LOS: 2 days   Brief Narrative / Interim history:  82 y.o. male  with medical history significant of esophageal stenosis s/p Cortrack NG-tube placed on 02/02/2023, insulin-dependent DM type II, essential hypertension, CAD, paroxysmal atrial fibrillation-not on anticoagulation, gallstone pancreatitis status post cholecystectomy May 2024, chronic anemia, chronic hyponatremia and moderate malnutrition presented to emergency department due to vomiting and displaced NG tube.  He vomited while taking his medication. Patient had recent hospital admission 01/28/2023 to 02/08/2023 due to AKI, dehydration, acute pancreatitis. Patient underwent EGD 7/29 showed esophageal stenosis, hiatal hernia and ulcerated gastric fundus with congested mucosa. As patient did not tolerated oral diet well feeding tube Cortrack placed on 7/31 and to continue liquid diet as tolerated. Patient was discharged home with [facility] and supposed to be follow-up with GI on 02/22/2023.   Subjective / 24h Interval events: Complains of significant abdominal pain this morning, and he tells me that this is new.  Received IV morphine just earlier this morning which helped.  Assesement and Plan: Principal Problem:   Encounter for feeding tube placement Active Problems:   Displaced nasogastric feeding tube   Esophageal stenosis   Chronic pancreatitis (HCC)   Diabetes type 2, controlled (HCC)   Leukocytosis   Essential hypertension   History of CAD (coronary artery disease)   Atrial fibrillation, chronic -not on coagulation (HCC)   Chronic disease anemia   Pancreatic pseudocyst   Acute recurrent pancreatitis   Intractable nausea and vomiting   Principal problem Esophageal stenosis, acute on chronic nausea, vomiting -core track tube replacement pending.  GI also following.  Continue symptomatic management  Active problems Recent acute  pancreatitis, complicated by pancreatic pseudocysts -recently hospitalized and discharged earlier this month.  GI consulted and following.  Imaging shows peripancreatic fluid collections in the head/uncinate have increased in size and is likely causing his pain and ongoing symptoms.  GI considering aspiration versus gastrostomy, timing to be determined. -Continue pain control, antiemetics  DM2 -last A1c was 5.8.  Hold insulin while strict n.p.o. monitor CBGs Q6  Chronic A-fib -on aspirin, not on anticoagulation.  Continue IV metoprolol PRN  History of CAD-hold aspirin, no chest pain  Chronic normocytic anemia-H&H stable  8 mm nodule, right posterior bladder wall -outpatient follow-up   Scheduled Meds:  bisacodyl  10 mg Rectal Once   heparin  5,000 Units Subcutaneous Q8H   ondansetron (ZOFRAN) IV  4 mg Intravenous Q6H   sodium chloride flush  3 mL Intravenous Q12H   Continuous Infusions:  sodium chloride     lactated ringers 75 mL/hr at 02/13/23 1618   promethazine (PHENERGAN) injection (IM or IVPB) 12.5 mg (02/13/23 2230)   PRN Meds:.sodium chloride, acetaminophen **OR** acetaminophen, hydrALAZINE, metoprolol tartrate, morphine injection, promethazine (PHENERGAN) injection (IM or IVPB), sodium chloride flush  Current Outpatient Medications  Medication Instructions   acetaminophen (TYLENOL) 650 mg, Per Tube, Every 6 hours PRN   Ascorbic Acid (VITAMIN C ER PO) 1 tablet, Oral, Daily   aspirin 81 mg, Oral, Daily   Blood Glucose Monitoring Suppl DEVI 1 each, Does not apply, 3 times daily, May substitute to any manufacturer covered by patient's insurance.   CREON 36000-114000 units CPEP capsule 36,000 Units, Oral, 3 times daily   DSS 100 mg, Oral, Daily PRN   Glucose Blood (BLOOD GLUCOSE TEST STRIPS) STRP 1 each, In Vitro, 3 times daily, May substitute to any manufacturer covered by  patient's insurance.   Insulin Pen Needle (PEN NEEDLES 3/16") 31G X 5 MM MISC Use to inject insulin    Lancet Device MISC 1 each, Does not apply, 3 times daily, May substitute to any manufacturer covered by patient's insurance.   Lantus SoloStar 10 Units, Subcutaneous, Daily   Multiple Vitamin (MULTIVITAMIN) tablet 1 tablet, Oral, Daily   Nutritional Supplements (FEEDING SUPPLEMENT, OSMOLITE 1.5 CAL,) LIQD 1,440 mLs, Per Tube, Every 24 hours   ondansetron (ZOFRAN-ODT) 4 mg, Every 8 hours PRN   oxyCODONE (ROXICODONE) 5 mg, Oral, Every 8 hours PRN   pantoprazole (PROTONIX) 40 mg, Oral, 2 times daily   polyethylene glycol powder (MIRALAX) 17 g, Oral, 2 times daily PRN   Protein (FEEDING SUPPLEMENT, PROSOURCE TF20,) liquid 60 mLs, Per Tube, Daily   sodium chloride (MURO 128) 5 % ophthalmic ointment 1 Application, Both Eyes, Daily at bedtime   Water For Irrigation, Sterile (FREE WATER) SOLN 100 mLs, Per Tube, Every  3 hours   Zinc Sulfate (ZINC-220 PO) 1 tablet, Oral, Daily    Diet Orders (From admission, onward)     Start     Ordered   02/12/23 0021  Diet NPO time specified  Diet effective now        02/12/23 0020            DVT prophylaxis: heparin injection 5,000 Units Start: 02/12/23 0600 SCDs Start: 02/12/23 0021   Lab Results  Component Value Date   PLT 313 02/14/2023      Code Status: Full Code  Family Communication: wife at bedside   Status is: Inpatient Remains inpatient appropriate because: severity of illness  Level of care: Med-Surg  Consultants:  GI  Objective: Vitals:   02/13/23 1545 02/13/23 2021 02/14/23 0330 02/14/23 0731  BP: 129/81 127/83 138/80 122/71  Pulse: 84 79 79 71  Resp: 16 18 18 18   Temp: 98.1 F (36.7 C) 98.5 F (36.9 C) 97.6 F (36.4 C) 98.3 F (36.8 C)  TempSrc: Oral Oral Oral Oral  SpO2: 98% 98% 95% 96%  Weight:      Height:        Intake/Output Summary (Last 24 hours) at 02/14/2023 0936 Last data filed at 02/13/2023 1700 Gross per 24 hour  Intake 227.43 ml  Output --  Net 227.43 ml   Wt Readings from Last 3 Encounters:   02/11/23 66.7 kg  02/08/23 68.6 kg  12/10/22 73.2 kg    Examination:  Constitutional: NAD Eyes: no scleral icterus ENMT: Mucous membranes are moist.  Neck: normal, supple Respiratory: clear to auscultation bilaterally, no wheezing, no crackles. Normal respiratory effort. No accessory muscle use.  Cardiovascular: Regular rate and rhythm, no murmurs / rubs / gallops. No LE edema.  Abdomen: non distended, no tenderness. Bowel sounds positive.  Musculoskeletal: no clubbing / cyanosis.    Data Reviewed: I have independently reviewed following labs and imaging studies   CBC Recent Labs  Lab 02/07/23 2359 02/11/23 2151 02/12/23 0406 02/13/23 0044 02/14/23 0046  WBC 15.3* 15.7* 12.1* 10.7* 10.1  HGB 10.8* 11.8* 10.5* 10.6* 10.3*  HCT 33.0* 35.8* 32.9* 33.1* 31.8*  PLT 278 377 299 323 313  MCV 86.4 86.9 85.7 84.4 85.5  MCH 28.3 28.6 27.3 27.0 27.7  MCHC 32.7 33.0 31.9 32.0 32.4  RDW 14.9 14.8 14.9 15.0 14.9  LYMPHSABS  --  3.8  --   --   --   MONOABS  --  0.9  --   --   --  EOSABS  --  0.2  --   --   --   BASOSABS  --  0.1  --   --   --     Recent Labs  Lab 02/07/23 2359 02/11/23 2151 02/12/23 0406 02/13/23 0044 02/14/23 0046  NA 132* 133* 135 134* 133*  K 4.2 4.3 4.0 3.7 3.6  CL 100 98 97* 100 100  CO2 22 26 22 23 22   GLUCOSE 278* 143* 95 83 127*  BUN 23 25* 22 19 20   CREATININE 0.95 0.92 0.80 0.80 0.83  CALCIUM 8.2* 8.9 8.5* 8.4* 8.2*  AST  --  30 27 32  --   ALT  --  35 29 35  --   ALKPHOS  --  77 61 67  --   BILITOT  --  0.4 0.5 1.0  --   ALBUMIN 2.3* 2.8* 2.4* 2.5*  --   MG 1.9  --   --   --  2.0    ------------------------------------------------------------------------------------------------------------------ No results for input(s): "CHOL", "HDL", "LDLCALC", "TRIG", "CHOLHDL", "LDLDIRECT" in the last 72 hours.  Lab Results  Component Value Date   HGBA1C 5.8 (H) 11/19/2022    ------------------------------------------------------------------------------------------------------------------ No results for input(s): "TSH", "T4TOTAL", "T3FREE", "THYROIDAB" in the last 72 hours.  Invalid input(s): "FREET3"  Cardiac Enzymes No results for input(s): "CKMB", "TROPONINI", "MYOGLOBIN" in the last 168 hours.  Invalid input(s): "CK" ------------------------------------------------------------------------------------------------------------------ No results found for: "BNP"  CBG: Recent Labs  Lab 02/07/23 2128 02/08/23 0742  GLUCAP 168* 179*    No results found for this or any previous visit (from the past 240 hour(s)).   Radiology Studies: DG Abd 2 Views  Result Date: 02/13/2023 CLINICAL DATA:  [id number] Recurrent vomiting [id number] EXAM: ABDOMEN - 2 VIEW COMPARISON:  02/12/2023 CT abdomen/pelvis FINDINGS: No dilated small bowel loops or air-fluid levels. Mild colonic stool. No evidence of pneumatosis or pneumoperitoneum. Clear lung bases. Cholecystectomy clips are seen in the right upper quadrant of the abdomen. Abdominal aortic atherosclerosis. Moderate lumbar spondylosis. No radiopaque nephrolithiasis. IMPRESSION: Nonobstructive bowel gas pattern. Mild colonic stool. Electronically Signed   By: Delbert Phenix M.D.   On: 02/13/2023 14:51     Pamella Pert, MD, PhD [facility]  Between 7 am - 7 pm I am available, please contact me via Amion (for emergencies) or Securechat (non urgent messages)  Between 7 pm - 7 am I am not available, please contact night coverage MD/APP via Amion

## 2023-02-14 NOTE — Consult Note (Signed)
   [facility] Inpatient Consult   02/14/2023  Jon Bray 1941-05-05 [id number]  [facility] [[facility]]  Accountable Care Organization [ACO] Patient: [employer] insurance  Primary Care Provider:  Danella Penton, MD with [facility] in [place]   is listed to provide the transition of care follow up appointments  Patient screened for less than  hospitalizations with noted high risk score for unplanned readmission risk to assess for potential [facility]  [[facility]] Care Management service needs for post hospital transition for care coordination.  Review of patient's electronic medical record reveals patient is had recent procedure. 2:35 pm Patient currently off the floor on rounds x2.  Plan:  Continue to follow progress and disposition to assess for post hospital community care coordination/management needs.  Referral request for community care coordination: pending disposition needs   Of note, [facility] Care Management/Population Health does not replace or interfere with any arrangements made by the Inpatient Transition of Care team.  For questions contact:   Charlesetta Shanks, RN BSN CCM [facility]  [phone] business mobile phone Toll free office [phone]  *Concierge Line  [phone] Fax number: [phone] [email or web address]

## 2023-02-14 NOTE — Telephone Encounter (Signed)
The pt is currently admitted  

## 2023-02-14 NOTE — Procedures (Addendum)
Cortrak  Person Inserting Tube:  Clovis Riley,  L, RD Tube Type:  Cortrak - 55 inches Tube Size:  10 Tube Location:  Left nare Secured by: Bridle Technique Used to Measure Tube Placement:  Marking at nare/corner of mouth Cortrak Secured At:  75 cm   Cortrak Tube Team Note:  Consult received to place a Cortrak feeding tube. Unable to get tube into post-pyloric location. Pt to go to Fluoroscopy for tube advancement. MD notified.   X-ray is required, abdominal x-ray has been ordered by the Cortrak team. Please confirm tube placement before using the Cortrak tube.   If the tube becomes dislodged please keep the tube and contact the Cortrak team at [email or web address] for replacement.  If after hours and replacement cannot be delayed, place a NG tube and confirm placement with an abdominal x-ray.    Kirby Crigler RD, LDN Clinical Dietitian See Loretha Stapler for contact information.

## 2023-02-14 NOTE — Progress Notes (Signed)
Patient ID: Jon Bray, male   DOB: 07-22-1940, 82 y.o.   MRN: [id number]    Progress Note   Subjective   Day # 4 CC: Recent severe gallstone pancreatitis, status post ERCP and stone extraction, subsequent complex course with peripancreatic fluid collections quiring recurrent hospitalizations. Now with increased fluid collections and associated nausea vomiting and poor oral intake  Attempt at postpyloric tube placement today unsuccessful per nursing, to go to IR  CT abdomen and pelvis 02/12/2023-inflammatory changes and stranding of the fat surrounding the head and uncinate process and multiple loculated fluid collections predominantly in the head and uncinate process largest measuring 6.5 x 5.5 overall there is been increase in size of the peripancreatic abscesses or pseudocyst since last CT, there is loss of fat plane between the pancreatic pseudocyst and posterior wall of the stomach, and extension of the pancreatic pseudocyst into posterior gastric wall, also a 2.2 cm hypodense lesion in the tail of the pancreas  WBC 10/hemoglobin 10.3/hematocrit 31.8 stable Potassium 3.6/BUN 20/creatinine 0.83 LFTs yesterday normal   Objective   Vital signs in last 24 hours: Temp:  [97.6 F (36.4 C)-98.5 F (36.9 C)] 98.3 F (36.8 C) (08/12 0731) Pulse Rate:  [71-84] 71 (08/12 0731) Resp:  [16-18] 18 (08/12 0731) BP: (122-138)/(71-83) 122/71 (08/12 0731) SpO2:  [95 %-98 %] 96 % (08/12 0731) Last BM Date : 02/11/23 General:    white ,ale in NAD Heart:  Regular rate and rhythm; no murmurs Lungs: Respirations even and unlabored, lungs CTA bilaterally Abdomen:  Soft, mildly tender in the epigastrium and right upper quadrant with a palpable gastric bubble, nondistended. Normal bowel sounds. Extremities:  Without edema. Neurologic:  Alert and oriented,  grossly normal neurologically. Psych:  Cooperative. Normal mood and affect.  Intake/Output from previous day: 08/11 0701 - 08/12 0700 In: 227.4  [I.V.:177.4; IV Piggyback:50] Out: -  Intake/Output this shift: No intake/output data recorded.  Lab Results: Recent Labs    02/12/23 0406 02/13/23 0044 02/14/23 0046  WBC 12.1* 10.7* 10.1  HGB 10.5* 10.6* 10.3*  HCT 32.9* 33.1* 31.8*  PLT 299 323 313   BMET Recent Labs    02/12/23 0406 02/13/23 0044 02/14/23 0046  NA 135 134* 133*  K 4.0 3.7 3.6  CL 97* 100 100  CO2 22 23 22   GLUCOSE 95 83 127*  BUN 22 19 20   CREATININE 0.80 0.80 0.83  CALCIUM 8.5* 8.4* 8.2*   LFT Recent Labs    02/13/23 0044  PROT 6.5  ALBUMIN 2.5*  AST 32  ALT 35  ALKPHOS 67  BILITOT 1.0   PT/INR No results for input(s): "LABPROT", "INR" in the last 72 hours.  Studies/Results: DG Abd 2 Views  Result Date: 02/13/2023 CLINICAL DATA:  [id number] Recurrent vomiting [id number] EXAM: ABDOMEN - 2 VIEW COMPARISON:  02/12/2023 CT abdomen/pelvis FINDINGS: No dilated small bowel loops or air-fluid levels. Mild colonic stool. No evidence of pneumatosis or pneumoperitoneum. Clear lung bases. Cholecystectomy clips are seen in the right upper quadrant of the abdomen. Abdominal aortic atherosclerosis. Moderate lumbar spondylosis. No radiopaque nephrolithiasis. IMPRESSION: Nonobstructive bowel gas pattern. Mild colonic stool. Electronically Signed   By: Delbert Phenix M.D.   On: 02/13/2023 14:51   CT ABDOMEN PELVIS W CONTRAST  Result Date: 02/12/2023 CLINICAL DATA:  Vomiting.  Pancreatitis. EXAM: CT ABDOMEN AND PELVIS WITH CONTRAST TECHNIQUE: Multidetector CT imaging of the abdomen and pelvis was performed using the standard protocol following bolus administration of intravenous contrast. RADIATION DOSE REDUCTION: This exam was  performed according to the departmental dose-optimization program which includes automated exposure control, adjustment of the mA and/or kV according to patient size and/or use of iterative reconstruction technique. CONTRAST:  75mL OMNIPAQUE IOHEXOL 350 MG/ML SOLN COMPARISON:  CT abdomen pelvis  dated 01/28/2023. FINDINGS: Lower chest: Minimal left lung base atelectasis/scarring. The visualized lung bases are otherwise clear. There is coronary vascular calcification. No intra-abdominal free air.  Trace free fluid within the pelvis. Hepatobiliary: The liver is unremarkable. No biliary dilatation. Cholecystectomy. Mild pneumobilia. Pancreas: Inflammatory changes and stranding of the fat surrounding the head and uncinate process of pancreas in keeping with provided history of pancreatitis. Multiple loculated fluid collection predominantly in the head and uncinate process of the pancreas in keeping with pseudocysts. The largest collection measures approximately 6.5 x 5.5 cm, previously 4.9 x 3.5 cm. Overall there has been interval increase in the size of the peripancreatic abscesses or pseudocysts since the prior CT. There is loss of fat plane between the pancreatic pseudocyst and posterior wall of the stomach. There is extension of the pancreatic pseudocyst into the posterior gastric wall. Similar appearance of a 2.2 cm hypodense lesion in the tail of the pancreas. Spleen: Normal in size without focal abnormality. Adrenals/Urinary Tract: The adrenal glands are unremarkable. There is no hydronephrosis on either side. There is symmetric enhancement and excretion of contrast by both kidneys. The visualized ureters appear unremarkable. There is an 8 mm enhancing nodule in the right posterior bladder wall adjacent to the ureterovesical junction suspicious for a urothelial neoplasm. Further evaluation with cystoscopy is recommended. Stomach/Bowel: There is severe sigmoid diverticulosis without active inflammatory changes. There is no bowel obstruction. The appendix is normal. Vascular/Lymphatic: Advanced aortoiliac atherosclerotic disease. The IVC is unremarkable. No portal venous gas. There is no adenopathy. Reproductive: The prostate and seminal vesicles are grossly unremarkable. No pelvic mass. Other: None  Musculoskeletal: Osteopenia with degenerative changes of the spine. No acute osseous pathology. IMPRESSION: 1. Interval increase in the size of the pancreatic pseudocysts since the prior CT extending into the posterior wall of the stomach. 2. Severe sigmoid diverticulosis. No bowel obstruction. Normal appendix. 3. An 8 mm enhancing nodule in the right posterior bladder wall adjacent to the ureterovesical junction suspicious for a urothelial neoplasm. Further evaluation with cystoscopy is recommended. 4.  Aortic Atherosclerosis (ICD10-I70.0). Electronically Signed   By: Elgie Collard M.D.   On: 02/12/2023 16:51    Radiology attempted nasoenteric tube placement:  IMPRESSION: 1. Feeding tube tip is in the stomach antrum. The tip could not be advanced into the duodenum despite extensive maneuvers and over 8 minutes of fluoroscopy time representing cumulative attempts. Post pyloric placement will likely require endoscopic assistance.     Electronically Signed   By: Gaylyn Rong M.D.   On: 02/14/2023 15:18   Assessment / Plan:   82 year old male with history of severe gallstone pancreatitis with ERCP sphincterotomy plus stone removal in May 2024, pancreatitis complicated by multiple fluid collections now with ongoing weight loss and inability to take nutrition by mouth due to nausea vomiting and upper abdominal distention and pain  Pancreatic pseudocyst/gastric outlet obstruction --unsuccessful nasoenteric/postpyloric tube placement both at bedside and with radiology.  Very likely this is secondary to extrinsic compression in the proximal duodenum from his pancreatic head pseudocyst.  This is enlarging by recent cross-sectional imaging. --Would not start enteric feedings unless definitive evidence tube is postpyloric; not currently the case -- May need to consider TPN -- Dr. Meridee Score planning EUS midday Wednesday for cyst  aspiration versus cyst gastrostomy creation -- Continue scheduled  Zofran  Discussed at length with the patient and his family was at bedside      Principal Problem:   Encounter for feeding tube placement Active Problems:   Diabetes type 2, controlled (HCC)   Displaced nasogastric feeding tube   Esophageal stenosis   Chronic pancreatitis (HCC)   Leukocytosis   Essential hypertension   History of CAD (coronary artery disease)   Atrial fibrillation, chronic -not on coagulation (HCC)   Chronic disease anemia   Pancreatic pseudocyst   Acute recurrent pancreatitis   Intractable nausea and vomiting     LOS: 2 days   Amy EsterwoodPA-C  02/14/2023, 2:46 PM

## 2023-02-14 NOTE — Progress Notes (Addendum)
Initial Nutrition Assessment  DOCUMENTATION CODES:   Not applicable  INTERVENTION:  Once tube available for use, recommend the following: Osmolite 1.5 @ 34mL/h (1435mL/d) Start at 35mL and adance by 10mL q4h to goal free water flush q4h Prosource TF 20 1x/d This provides 2240kcal, 110g protein, and of water ( free water TF+flush) At discharge, feeds can be cycled: Osmolite 1.5 at 90 mL/hr x 16 hours from 1800 to 1000 (1440 mL or 6 cartons per day) Free water flush: 100 mL before and after connecting from pump + 4 additional 100 mL flushes throughout the day Regimen provides 2160 kcal, 90 gm protein, and free water (TF+flush 1697 mL total free water daily).  If pt unable to have enteral nutrition initiated due to placement of tube and/or intolerance, would recommend initiation of TPN  NUTRITION DIAGNOSIS:   Inadequate oral intake related to nausea, vomiting as evidenced by per patient/family report.  GOAL:   Patient will meet greater than or equal to 90% of their needs  MONITOR:   Diet advancement, TF tolerance, Labs  REASON FOR ASSESSMENT:   Consult Enteral/tube feeding initiation and management  ASSESSMENT:   Pt with hx of CAD, CKD, DM type 2, GERD, HLD, PAF, hx STEMI, and diverticulosis presented to ED with a dislodged NJ tube. Pt with recent admission related to severe pancreatitis and was discharged with NJ tube in place for enteral feeds.  Pt underwent cortrak placement this AM but tube was unable to be advanced into the small bowel. Per GI note, tube needs to be placed distal to the duodenal sweep at the ligament of Treitz. Tube left gastric and pt to be taken down to fluoroscopy for advancement into the proper position.   Pt out of room for tube advancement at the time of visit. Will defer repeat physical exam to in-person assessment. Pt with malnutrition diagnosis during last admission and noted to have a 12.5% weight loss over the last 3  months which is severe.  If tube not able to be manipulated into the proper place, pt will need to have TPN initiated until enteral feeds can be tolerated.   Nutritionally Relevant Medications: Scheduled Meds:  bisacodyl  10 mg Rectal Once   ondansetron (ZOFRAN) IV  4 mg Intravenous Q6H   Continuous Infusions:  sodium chloride     promethazine  12.5 mg (02/13/23 2230)   PRN Meds: promethazine  Labs Reviewed: Na 133 CBG ranges from 136-149 mg/dL over the last 24 hours  NUTRITION - FOCUSED PHYSICAL EXAM: Defer to in-person assessment  Diet Order:   Diet Order             Diet NPO time specified  Diet effective now                   EDUCATION NEEDS:   Not appropriate for education at this time  Skin:  Skin Assessment: Reviewed RN Assessment  Last BM:  8/10 per pt wife  Height:   Ht Readings from Last 1 Encounters:  02/11/23 5\' 8"  (1.727 m)    Weight:   Wt Readings from Last 1 Encounters:  02/11/23 66.7 kg    Ideal Body Weight:  70 kg  BMI:  Body mass index is 22.35 kg/m.  Estimated Nutritional Needs:   Kcal:  2100-2300 kcal/d  Protein:  105-125 g/d  Fluid:  2.1-2.3L/d    Greig Castilla, RD, LDN Clinical Dietitian RD pager # available in [facility]  After hours/weekend pager #  available in [facility]

## 2023-02-15 ENCOUNTER — Other Ambulatory Visit: Payer: Self-pay

## 2023-02-15 DIAGNOSIS — K863 Pseudocyst of pancreas: Secondary | ICD-10-CM | POA: Diagnosis not present

## 2023-02-15 DIAGNOSIS — K311 Adult hypertrophic pyloric stenosis: Secondary | ICD-10-CM | POA: Diagnosis not present

## 2023-02-15 DIAGNOSIS — K851 Biliary acute pancreatitis without necrosis or infection: Secondary | ICD-10-CM | POA: Diagnosis not present

## 2023-02-15 DIAGNOSIS — Z4659 Encounter for fitting and adjustment of other gastrointestinal appliance and device: Secondary | ICD-10-CM | POA: Diagnosis not present

## 2023-02-15 LAB — VITAMIN B12: Vitamin B-12: 571 pg/mL (ref 180–914)

## 2023-02-15 LAB — GLUCOSE, CAPILLARY
Glucose-Capillary: 110 mg/dL — ABNORMAL HIGH (ref 70–99)
Glucose-Capillary: 144 mg/dL — ABNORMAL HIGH (ref 70–99)
Glucose-Capillary: 169 mg/dL — ABNORMAL HIGH (ref 70–99)

## 2023-02-15 MED ORDER — CHLORHEXIDINE GLUCONATE CLOTH 2 % EX PADS
6.0000 | MEDICATED_PAD | Freq: Every day | CUTANEOUS | Status: DC
  Administered 2023-02-15 – 2023-02-26 (×11): 6 via TOPICAL

## 2023-02-15 MED ORDER — SODIUM CHLORIDE 0.9% FLUSH
10.0000 mL | INTRAVENOUS | Status: DC | PRN
  Administered 2023-02-18: 10 mL
  Administered 2023-02-22: 20 mL

## 2023-02-15 MED ORDER — THIAMINE HCL 100 MG/ML IJ SOLN
100.0000 mg | Freq: Every day | INTRAMUSCULAR | Status: DC
  Administered 2023-02-15 – 2023-02-18 (×4): 100 mg via INTRAVENOUS
  Filled 2023-02-15 (×4): qty 2

## 2023-02-15 MED ORDER — TRAVASOL 10 % IV SOLN
INTRAVENOUS | Status: AC
  Filled 2023-02-15: qty 367.2

## 2023-02-15 MED ORDER — INSULIN ASPART 100 UNIT/ML IJ SOLN
0.0000 [IU] | INTRAMUSCULAR | Status: DC
  Administered 2023-02-15 – 2023-02-16 (×4): 3 [IU] via SUBCUTANEOUS
  Administered 2023-02-16: 5 [IU] via SUBCUTANEOUS
  Administered 2023-02-16 – 2023-02-17 (×4): 3 [IU] via SUBCUTANEOUS
  Administered 2023-02-17: 5 [IU] via SUBCUTANEOUS
  Administered 2023-02-17 (×2): 3 [IU] via SUBCUTANEOUS
  Administered 2023-02-18: 5 [IU] via SUBCUTANEOUS
  Administered 2023-02-18 (×2): 3 [IU] via SUBCUTANEOUS

## 2023-02-15 MED ORDER — IPRATROPIUM-ALBUTEROL 0.5-2.5 (3) MG/3ML IN SOLN: 3.0000 mL | RESPIRATORY_TRACT | Status: DC | PRN

## 2023-02-15 MED ORDER — SODIUM CHLORIDE 0.9% FLUSH
10.0000 mL | Freq: Two times a day (BID) | INTRAVENOUS | Status: DC
  Administered 2023-02-15 – 2023-02-26 (×20): 10 mL

## 2023-02-15 MED ORDER — ONDANSETRON HCL 4 MG/2ML IJ SOLN
4.0000 mg | INTRAMUSCULAR | Status: DC | PRN
  Administered 2023-02-15 – 2023-02-22 (×4): 4 mg via INTRAVENOUS
  Filled 2023-02-15 (×3): qty 2

## 2023-02-15 MED ORDER — POTASSIUM CHLORIDE 10 MEQ/100ML IV SOLN
10.0000 meq | INTRAVENOUS | Status: AC
  Administered 2023-02-15 (×4): 10 meq via INTRAVENOUS
  Filled 2023-02-15 (×4): qty 100

## 2023-02-15 MED ORDER — HYDRALAZINE HCL 20 MG/ML IJ SOLN: 10.0000 mg | INTRAMUSCULAR | Status: DC | PRN

## 2023-02-15 MED ORDER — GUAIFENESIN 100 MG/5ML PO LIQD: 5.0000 mL | ORAL | Status: DC | PRN

## 2023-02-15 MED ORDER — SENNOSIDES-DOCUSATE SODIUM 8.6-50 MG PO TABS: 1.0000 | ORAL_TABLET | Freq: Every evening | ORAL | Status: DC | PRN

## 2023-02-15 MED ORDER — METOPROLOL TARTRATE 5 MG/5ML IV SOLN: 5.0000 mg | INTRAVENOUS | Status: DC | PRN

## 2023-02-15 MED ORDER — MAGNESIUM SULFATE 2 GM/50ML IV SOLN
2.0000 g | Freq: Once | INTRAVENOUS | Status: AC
  Administered 2023-02-15: 2 g via INTRAVENOUS
  Filled 2023-02-15: qty 50

## 2023-02-15 NOTE — Plan of Care (Signed)
  Problem: Education: Goal: Knowledge of General Education information will improve Description Including pain rating scale, medication(s)/side effects and non-pharmacologic comfort measures Outcome: Progressing   Problem: Clinical Measurements: Goal: Respiratory complications will improve Outcome: Progressing Goal: Cardiovascular complication will be avoided Outcome: Progressing   Problem: Elimination: Goal: Will not experience complications related to bowel motility Outcome: Progressing Goal: Will not experience complications related to urinary retention Outcome: Progressing   Problem: Safety: Goal: Ability to remain free from injury will improve Outcome: Progressing   Problem: Skin Integrity: Goal: Risk for impaired skin integrity will decrease Outcome: Progressing   

## 2023-02-15 NOTE — Progress Notes (Signed)
Peripherally Inserted Central Catheter Placement  The IV Nurse has discussed with the patient and/or persons authorized to consent for the patient, the purpose of this procedure and the potential benefits and risks involved with this procedure.  The benefits include less needle sticks, lab draws from the catheter, and the patient may be discharged home with the catheter. Risks include, but not limited to, infection, bleeding, blood clot (thrombus formation), and puncture of an artery; nerve damage and irregular heartbeat and possibility to perform a PICC exchange if needed/ordered by physician.  Alternatives to this procedure were also discussed.  Bard Power PICC patient education guide, fact sheet on infection prevention and patient information card has been provided to patient /or left at bedside.    PICC Placement Documentation  PICC Double Lumen 02/15/23 Left Basilic 42 cm 0 cm (Active)  Indication for Insertion or Continuance of Line Administration of hyperosmolar/irritating solutions (i.e. TPN, Vancomycin, etc.) 02/15/23 1200  Exposed Catheter (cm) 0 cm 02/15/23 1200  Site Assessment Clean, Dry, Intact 02/15/23 1200  Lumen #1 Status Flushed;Saline locked;Blood return noted 02/15/23 1200  Lumen #2 Status Flushed;Saline locked;Blood return noted 02/15/23 1200  Dressing Type Transparent;Securing device 02/15/23 1200  Dressing Status Antimicrobial disc in place;Clean, Dry, Intact 02/15/23 1200  Line Care Connections checked and tightened 02/15/23 1200  Line Adjustment (NICU/IV Team Only) No 02/15/23 1200  Dressing Intervention New dressing 02/15/23 1200  Dressing Change Due 02/22/23 02/15/23 1200       Franne Grip Renee 02/15/2023, 12:15 PM

## 2023-02-15 NOTE — Progress Notes (Signed)
PHARMACY - TOTAL PARENTERAL NUTRITION CONSULT NOTE  Indication: Gallstone pancreatitis with GOO  Patient Measurements: Height: 5\' 8"  (172.7 cm) Weight: 67.6 kg (149 lb 0.5 oz) IBW/kg (Calculated) : 68.4 TPN AdjBW (KG): 66.7 Body mass index is 22.66 kg/m.  Assessment:  84 YOM presented on 8/9 with NGT displacement.  Patient/family reports admitted in May for 21 days for severe pancreatitis.  He was discharged on an oral diet and did not eat well.  He was readmitted in July, from which he was discharged on tube feeding.  He was on TF for 3-4 days before he vomited and the NGT was dislodged.  Patient/family reports losing ~40 lbs since May and his intake has been poor since.   RD and IR attempted post-pyloric tube placement and were unsuccessful.  Pharmacy consulted to manage TPN.  Glucose / Insulin: DM2 on Basaglar 10/d PTA.  CBGs < 180 while NPO. Electrolytes: Na low at 133 prior to TPN, others WNL (K and Mag low normal) B12 WNL Renal: SCr < 1, BUN WNL Hepatic: LFTs / tbili WNL, lipase 262, albumin 2.5 Intake / Output; MIVF: N/A GI Imaging: none since TPN initiation GI Surgeries / Procedures: none since TPN initiation   Central access: PICC ordered on 02/15/23 TPN start date: 02/15/23  Nutritional Goals:  RD Estimated Needs Total Energy Estimated Needs: 2100-2300 kcal/d Total Protein Estimated Needs: 105-125 g/d Total Fluid Estimated Needs: 2.1-2.3L/d  Current Nutrition:  NPO  Plan:  Start TPN at 30 mL/hr at 1800 (goal rate 85 ml/hr) Electrolytes in TPN: Na 144mEq/L, K 32mEq/L, Ca 30mEq/L, Mg 56mEq/L, Phos 20mmol/L. Cl:Ac 1:1 Add standard MVI and trace elements to TPN Initiate moderate SSI Q4H KCL x 4 runs Mag sulfate 2gm IV Thiamine 100mg  IV daily x 5 days or until TPN is at goal rate Standard TPN labs and nursing care orders   D. Laney Potash, PharmD, BCPS, BCCCP 02/15/2023, 12:32 PM

## 2023-02-15 NOTE — Progress Notes (Addendum)
Nutrition Brief Note   Cortrak feeding tube unable to be advanced post-pyloric by Cortrak team or fluoroscopy. RD reached out to MD and GI PA to initiate TPN until tube is able to be advanced post-pyloric. Both in agreement, plan to start TPN today.  Discontinued enteral nutrition orders.   Discussed with pt and daughter at bedside. No questions at this time.   Monitor magnesium, potassium, and phosphorus BID for at least 3 days, MD to replete as needed, as pt is at high risk for refeeding syndrome given NPO x 4 days and history of malnutrition.   RD team will continue to follow.  Kirby Crigler RD, LDN Clinical Dietitian See Loretha Stapler for contact information.

## 2023-02-15 NOTE — Progress Notes (Signed)
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  Pancreatic pseudocyst/gastroduodenal outlet obstruction --unable to get nasoenteric tube postpyloric.  I remove this at bedside today.  Agree with TPN -- Plan is for EUS tomorrow for either drainage of pancreatic pseudocyst via transgastric approach versus pancreatic cyst gastrostomy with Dr. Meridee Score; remain n.p.o.; tentative plan is for 11:45 AM -- Nasoenteric tube removed by me -- Begin TPN per TPN team; PICC is in place -- Continue scheduled Zofran for now    Chief Complaint   No complaints today Patient had PICC line placed in left upper extremity recently Hoping for EUS tomorrow Wife at bedside No nausea or vomiting as he has been n.p.o.  Vital signs in last 24 hours: Temp:  [97.8 F (36.6 C)-98.6 F (37 C)] 98.6 F (37 C) (08/13 0725) Pulse Rate:  [78-86] 79 (08/13 0725) Resp:  [18] 18 (08/13 0725) BP: (114-129)/(63-77) 117/63 (08/13 0725) SpO2:  [95 %-98 %] 96 % (08/13 0725) Weight:  [67.6 kg] 67.6 kg (08/13 0500) Last BM Date : 02/11/23 Gen: awake, alert, NAD HEENT: anicteric; nasoenteric tube removed from left nare during my exam CV: RRR, no mrg Pulm: CTA b/l Abd: soft, NT/ND, +BS throughout Ext: no c/c/e Neuro: nonfocal  Intake/Output from previous day: No intake/output data recorded. Intake/Output this shift: No intake/output data recorded.  Lab Results: Recent Labs    02/13/23 0044 02/14/23 0046 02/15/23 0136  WBC 10.7* 10.1 11.8*  HGB 10.6* 10.3* 10.5*  HCT 33.1* 31.8* 31.7*  PLT 323 313 332   BMET Recent Labs    02/13/23 0044 02/14/23 0046 02/15/23 0136  NA 134* 133* 133*  K 3.7 3.6 3.6  CL 100 100 100  CO2 23 22 23    GLUCOSE 83 127* 117*  BUN 19 20 17   CREATININE 0.80 0.83 0.72  CALCIUM 8.4* 8.2* 8.1*   LFT Recent Labs    02/13/23 0044  PROT 6.5  ALBUMIN 2.5*  AST 32  ALT 35  ALKPHOS 67  BILITOT 1.0   Studies/Results: [place] EKG SITE RITE  Result Date: 02/15/2023 If Site Rite image not attached, placement could not be confirmed due to current cardiac rhythm.  DG Naso G Tube Plc W/Fl W/Rad  Result Date: 02/14/2023 CLINICAL DATA:  Feeding tube placement EXAM: NASO G TUBE PLACEMENT WITH FL AND WITH RAD CONTRAST:  50mL OMNIPAQUE IOHEXOL 300 MG/ML  SOLN FLUOROSCOPY: Radiation Exposure Index (if provided by the fluoroscopic device): 38.6 mGy air kerma COMPARISON:  Abdomen radiograph 02/13/2023 FINDINGS: Feeding tube advanced into the stomach antrum but despite multiple attempts by various operators including Dr. Ova Freshwater, the use of contrast, the use of wires, turning the patient, and other maneuvers, we were not able to cause the tube to advance into the duodenum. There seems to be either stenosis or an unusual turn at the junction of the stomach and duodenum which could not be easily preferentially selected, possibly due to mass effect from the adjacent pseudocyst. After over 8 minutes of fluoroscopy time, further advancement attempts were felt to be futile, and the tube was left in position with tip in the stomach antrum. Post pyloric placement will likely require endoscopic assistance. IMPRESSION: 1. Feeding tube tip is in the stomach antrum. The tip could not be advanced into the  duodenum despite extensive maneuvers and over 8 minutes of fluoroscopy time representing cumulative attempts. Post pyloric placement will likely require endoscopic assistance. Electronically Signed   By: Gaylyn Rong M.D.   On: 02/14/2023 15:18   DG Abd 2 Views  Result Date: 02/13/2023 CLINICAL DATA:  [id number] Recurrent vomiting [id number] EXAM: ABDOMEN - 2 VIEW COMPARISON:  02/12/2023 CT abdomen/pelvis FINDINGS: No dilated  small bowel loops or air-fluid levels. Mild colonic stool. No evidence of pneumatosis or pneumoperitoneum. Clear lung bases. Cholecystectomy clips are seen in the right upper quadrant of the abdomen. Abdominal aortic atherosclerosis. Moderate lumbar spondylosis. No radiopaque nephrolithiasis. IMPRESSION: Nonobstructive bowel gas pattern. Mild colonic stool. Electronically Signed   By: Delbert Phenix M.D.   On: 02/13/2023 14:51      LOS: 3 days   Beverley Fiedler, MD 02/15/2023, 1:52 PM See Loretha Stapler, [facility], to contact our on call provider

## 2023-02-15 NOTE — Progress Notes (Signed)
PROGRESS NOTE    [name]  ZOX:[id number] DOB: 22-Dec-1940 DOA: 02/11/2023 PCP: Danella Penton, MD   Brief Narrative:  82 y.o. male  with medical history significant of esophageal stenosis s/p Cortrack NG-tube placed on 02/02/2023, insulin-dependent DM type II, essential hypertension, CAD, paroxysmal atrial fibrillation-not on anticoagulation, gallstone pancreatitis status post cholecystectomy May 2024, chronic anemia, chronic hyponatremia and moderate malnutrition presented to emergency department due to vomiting and displaced NG tube.  Patient had recent hospital admission 01/28/2023 to 02/08/2023 due to AKI, dehydration, acute pancreatitis. Patient underwent EGD 7/29 showed esophageal stenosis, hiatal hernia and ulcerated gastric fundus with congested mucosa. As patient did not tolerated oral diet well feeding tube Cortrack placed on 7/31 and to continue liquid diet as tolerated. Patient was discharged home with [facility] and supposed to be follow-up with GI on 02/22/2023.  CT scan 8/10 shows inflammatory changes with stranding of the fat surrounding had an aggressive process and peripancreatic fluid collection.  [facility] team has been consulted.   Assessment & Plan:  Principal Problem:   Encounter for feeding tube placement Active Problems:   Displaced nasogastric feeding tube   Esophageal stenosis   Chronic pancreatitis (HCC)   Diabetes type 2, controlled (HCC)   Leukocytosis   Essential hypertension   History of CAD (coronary artery disease)   Atrial fibrillation, chronic -not on coagulation (HCC)   Chronic disease anemia   Pancreatic pseudocyst   Acute recurrent pancreatitis   Intractable nausea and vomiting   Gastric outflow obstruction   Esophageal stenosis, acute on chronic nausea, vomiting  Recurrent pancreatitis/pseudocyst and gastric outlet obstruction. Unfortunately patient has peripancreatic fluid collection/gastric outlet obstruction.  Recommending against any enteric feeding  for now.  Tentative plans for EUS for cyst aspiration on Wednesday.  Will go ahead and order PICC line and TPN to be started. - Pain control, antiemetics.   DM2  -last A1c was 5.8.  Hold insulin while strict n.p.o. monitor CBGs Q6   Chronic A-fib -on aspirin, not on anticoagulation.  IV as needed   History of CAD -hold aspirin, no chest pain   Chronic normocytic anemia -H&H stable   8 mm nodule, right posterior bladder wall  -outpatient follow-up   DVT prophylaxis: heparin injection 5,000 Units Start: 02/12/23 0600 SCDs Start: 02/12/23 0021   Code Status: Full Family Communication: Daughter at bedside, spoke with wife over the phone Status is: Inpatient Remains inpatient appropriate because: Continue hospital stay for abdominal function to return       Diet Orders (From admission, onward)     Start     Ordered   02/12/23 0021  Diet NPO time specified  Diet effective now        02/12/23 0020            Subjective: Patient seen and examined at bedside, still unable to tolerate any orals and having some belching.   Examination:  General exam: Appears calm and comfortable, cortrak in place but not functional at this time Respiratory system: Clear to auscultation. Respiratory effort normal. Cardiovascular system: S1 & S2 heard, RRR. No JVD, murmurs, rubs, gallops or clicks. No pedal edema. Gastrointestinal system: Abdomen is nondistended, soft and nontender. No organomegaly or masses felt. Normal bowel sounds heard. Central nervous system: Alert and oriented. No focal neurological deficits. Extremities: Symmetric 5 x 5 power. Skin: No rashes, lesions or ulcers Psychiatry: Judgement and insight appear normal. Mood & affect appropriate.  Objective: Vitals:   02/14/23 1953 02/15/23 0401 02/15/23  0500 02/15/23 0725  BP: 114/77 129/69  117/63  Pulse: 82 78  79  Resp: 18 18  18   Temp: 98.3 F (36.8 C)   98.6 F (37 C)  TempSrc: Oral   Oral  SpO2: 98% 95%  96%   Weight:   67.6 kg   Height:       No intake or output data in the 24 hours ending 02/15/23 0927 Filed Weights   02/11/23 1939 02/15/23 0500  Weight: 66.7 kg 67.6 kg    Scheduled Meds:  bisacodyl  10 mg Rectal Once   heparin  5,000 Units Subcutaneous Q8H   ondansetron (ZOFRAN) IV  4 mg Intravenous Q6H   sodium chloride flush  3 mL Intravenous Q12H   Continuous Infusions:  sodium chloride     lactated ringers 75 mL/hr at 02/14/23 2120   promethazine (PHENERGAN) injection (IM or IVPB) 12.5 mg (02/14/23 2054)    Nutritional status Signs/Symptoms: per patient/family report Interventions: Refer to RD note for recommendations Body mass index is 22.66 kg/m.  Data Reviewed:   CBC: Recent Labs  Lab 02/11/23 2151 02/12/23 0406 02/13/23 0044 02/14/23 0046 02/15/23 0136  WBC 15.7* 12.1* 10.7* 10.1 11.8*  NEUTROABS 10.6*  --   --   --   --   HGB 11.8* 10.5* 10.6* 10.3* 10.5*  HCT 35.8* 32.9* 33.1* 31.8* 31.7*  MCV 86.9 85.7 84.4 85.5 84.8  PLT 377 299 323 313 332   Basic Metabolic Panel: Recent Labs  Lab 02/11/23 2151 02/12/23 0406 02/13/23 0044 02/14/23 0046 02/15/23 0136  NA 133* 135 134* 133* 133*  K 4.3 4.0 3.7 3.6 3.6  CL 98 97* 100 100 100  CO2 26 22 23 22 23   GLUCOSE 143* 95 83 127* 117*  BUN 25* 22 19 20 17   CREATININE 0.92 0.80 0.80 0.83 0.72  CALCIUM 8.9 8.5* 8.4* 8.2* 8.1*  MG  --   --   --  2.0 1.8  PHOS  --   --   --  4.3 3.6   GFR: Estimated Creatinine Clearance: 66.9 mL/min (by C-G formula based on SCr of 0.72 mg/dL). Liver Function Tests: Recent Labs  Lab 02/11/23 2151 02/12/23 0406 02/13/23 0044  AST 30 27 32  ALT 35 29 35  ALKPHOS 77 61 67  BILITOT 0.4 0.5 1.0  PROT 7.5 6.5 6.5  ALBUMIN 2.8* 2.4* 2.5*   Recent Labs  Lab 02/13/23 0044  LIPASE 262*   No results for input(s): "AMMONIA" in the last 168 hours. Coagulation Profile: No results for input(s): "INR", "PROTIME" in the last 168 hours. Cardiac Enzymes: No results for  input(s): "CKTOTAL", "CKMB", "CKMBINDEX", "TROPONINI" in the last 168 hours. BNP (last 3 results) No results for input(s): "PROBNP" in the last 8760 hours. HbA1C: No results for input(s): "HGBA1C" in the last 72 hours. CBG: Recent Labs  Lab 02/14/23 0959 02/14/23 1143 02/14/23 2341 02/15/23 0609  GLUCAP 136* 149* 117* 110*   Lipid Profile: No results for input(s): "CHOL", "HDL", "LDLCALC", "TRIG", "CHOLHDL", "LDLDIRECT" in the last 72 hours. Thyroid Function Tests: No results for input(s): "TSH", "T4TOTAL", "FREET4", "T3FREE", "THYROIDAB" in the last 72 hours. Anemia Panel: No results for input(s): "VITAMINB12", "FOLATE", "FERRITIN", "TIBC", "IRON", "RETICCTPCT" in the last 72 hours. Sepsis Labs: No results for input(s): "PROCALCITON", "LATICACIDVEN" in the last 168 hours.  No results found for this or any previous visit (from the past 240 hour(s)).       Radiology Studies: DG Naso G Tube Plc  W/Fl W/Rad  Result Date: 02/14/2023 CLINICAL DATA:  Feeding tube placement EXAM: NASO G TUBE PLACEMENT WITH FL AND WITH RAD CONTRAST:  50mL OMNIPAQUE IOHEXOL 300 MG/ML  SOLN FLUOROSCOPY: Radiation Exposure Index (if provided by the fluoroscopic device): 38.6 mGy air kerma COMPARISON:  Abdomen radiograph 02/13/2023 FINDINGS: Feeding tube advanced into the stomach antrum but despite multiple attempts by various operators including Dr. Ova Freshwater, the use of contrast, the use of wires, turning the patient, and other maneuvers, we were not able to cause the tube to advance into the duodenum. There seems to be either stenosis or an unusual turn at the junction of the stomach and duodenum which could not be easily preferentially selected, possibly due to mass effect from the adjacent pseudocyst. After over 8 minutes of fluoroscopy time, further advancement attempts were felt to be futile, and the tube was left in position with tip in the stomach antrum. Post pyloric placement will likely require  endoscopic assistance. IMPRESSION: 1. Feeding tube tip is in the stomach antrum. The tip could not be advanced into the duodenum despite extensive maneuvers and over 8 minutes of fluoroscopy time representing cumulative attempts. Post pyloric placement will likely require endoscopic assistance. Electronically Signed   By: Gaylyn Rong M.D.   On: 02/14/2023 15:18   DG Abd 2 Views  Result Date: 02/13/2023 CLINICAL DATA:  [id number] Recurrent vomiting [id number] EXAM: ABDOMEN - 2 VIEW COMPARISON:  02/12/2023 CT abdomen/pelvis FINDINGS: No dilated small bowel loops or air-fluid levels. Mild colonic stool. No evidence of pneumatosis or pneumoperitoneum. Clear lung bases. Cholecystectomy clips are seen in the right upper quadrant of the abdomen. Abdominal aortic atherosclerosis. Moderate lumbar spondylosis. No radiopaque nephrolithiasis. IMPRESSION: Nonobstructive bowel gas pattern. Mild colonic stool. Electronically Signed   By: Delbert Phenix M.D.   On: 02/13/2023 14:51           LOS: 3 days   Time spent= 35 mins     Joline Maxcy, MD [facility]  If 7PM-7AM, please contact night-coverage  02/15/2023, 9:27 AM

## 2023-02-15 NOTE — H&P (View-Only) (Signed)
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  Pancreatic pseudocyst/gastroduodenal outlet obstruction --unable to get nasoenteric tube postpyloric.  I remove this at bedside today.  Agree with TPN -- Plan is for EUS tomorrow for either drainage of pancreatic pseudocyst via transgastric approach versus pancreatic cyst gastrostomy with Dr. Meridee Score; remain n.p.o.; tentative plan is for 11:45 AM -- Nasoenteric tube removed by me -- Begin TPN per TPN team; PICC is in place -- Continue scheduled Zofran for now    Chief Complaint   No complaints today Patient had PICC line placed in left upper extremity recently Hoping for EUS tomorrow Wife at bedside No nausea or vomiting as he has been n.p.o.  Vital signs in last 24 hours: Temp:  [97.8 F (36.6 C)-98.6 F (37 C)] 98.6 F (37 C) (08/13 0725) Pulse Rate:  [78-86] 79 (08/13 0725) Resp:  [18] 18 (08/13 0725) BP: (114-129)/(63-77) 117/63 (08/13 0725) SpO2:  [95 %-98 %] 96 % (08/13 0725) Weight:  [67.6 kg] 67.6 kg (08/13 0500) Last BM Date : 02/11/23 Gen: awake, alert, NAD HEENT: anicteric; nasoenteric tube removed from left nare during my exam CV: RRR, no mrg Pulm: CTA b/l Abd: soft, NT/ND, +BS throughout Ext: no c/c/e Neuro: nonfocal  Intake/Output from previous day: No intake/output data recorded. Intake/Output this shift: No intake/output data recorded.  Lab Results: Recent Labs    02/13/23 0044 02/14/23 0046 02/15/23 0136  WBC 10.7* 10.1 11.8*  HGB 10.6* 10.3* 10.5*  HCT 33.1* 31.8* 31.7*  PLT 323 313 332   BMET Recent Labs    02/13/23 0044 02/14/23 0046 02/15/23 0136  NA 134* 133* 133*  K 3.7 3.6 3.6  CL 100 100 100  CO2 23 22 23    GLUCOSE 83 127* 117*  BUN 19 20 17   CREATININE 0.80 0.83 0.72  CALCIUM 8.4* 8.2* 8.1*   LFT Recent Labs    02/13/23 0044  PROT 6.5  ALBUMIN 2.5*  AST 32  ALT 35  ALKPHOS 67  BILITOT 1.0   Studies/Results: [place] EKG SITE RITE  Result Date: 02/15/2023 If Site Rite image not attached, placement could not be confirmed due to current cardiac rhythm.  DG Naso G Tube Plc W/Fl W/Rad  Result Date: 02/14/2023 CLINICAL DATA:  Feeding tube placement EXAM: NASO G TUBE PLACEMENT WITH FL AND WITH RAD CONTRAST:  50mL OMNIPAQUE IOHEXOL 300 MG/ML  SOLN FLUOROSCOPY: Radiation Exposure Index (if provided by the fluoroscopic device): 38.6 mGy air kerma COMPARISON:  Abdomen radiograph 02/13/2023 FINDINGS: Feeding tube advanced into the stomach antrum but despite multiple attempts by various operators including Dr. Ova Freshwater, the use of contrast, the use of wires, turning the patient, and other maneuvers, we were not able to cause the tube to advance into the duodenum. There seems to be either stenosis or an unusual turn at the junction of the stomach and duodenum which could not be easily preferentially selected, possibly due to mass effect from the adjacent pseudocyst. After over 8 minutes of fluoroscopy time, further advancement attempts were felt to be futile, and the tube was left in position with tip in the stomach antrum. Post pyloric placement will likely require endoscopic assistance. IMPRESSION: 1. Feeding tube tip is in the stomach antrum. The tip could not be advanced into the  duodenum despite extensive maneuvers and over 8 minutes of fluoroscopy time representing cumulative attempts. Post pyloric placement will likely require endoscopic assistance. Electronically Signed   By: Gaylyn Rong M.D.   On: 02/14/2023 15:18   DG Abd 2 Views  Result Date: 02/13/2023 CLINICAL DATA:  [id number] Recurrent vomiting [id number] EXAM: ABDOMEN - 2 VIEW COMPARISON:  02/12/2023 CT abdomen/pelvis FINDINGS: No dilated  small bowel loops or air-fluid levels. Mild colonic stool. No evidence of pneumatosis or pneumoperitoneum. Clear lung bases. Cholecystectomy clips are seen in the right upper quadrant of the abdomen. Abdominal aortic atherosclerosis. Moderate lumbar spondylosis. No radiopaque nephrolithiasis. IMPRESSION: Nonobstructive bowel gas pattern. Mild colonic stool. Electronically Signed   By: Delbert Phenix M.D.   On: 02/13/2023 14:51      LOS: 3 days   Beverley Fiedler, MD 02/15/2023, 1:52 PM See Loretha Stapler, [facility], to contact our on call provider

## 2023-02-15 NOTE — Care Management Important Message (Signed)
Important Message  Patient Details  Name: Jon Bray MRN: [id number] Date of Birth: 06/20/1941   Medicare Important Message Given:  Yes      Stefan Church 02/15/2023, 2:30 PM

## 2023-02-15 NOTE — TOC Initial Note (Signed)
Transition of Care [facility]) - Initial/Assessment Note    Patient Details  Name: Jon Bray MRN: [id number] Date of Birth: March 06, 1941  Transition of Care [facility]) CM/SW Contact:    Lawerance Sabal, RN Phone Number: 02/15/2023, 12:53 PM  Clinical Narrative:                  Readmission from home.  Spoke w patient and daughter at bedside.  Previously DC'd with Cortrak (Gtube not option for this patient at time of last DC_ with tube feeds through [employer] and HH services through [facility].  Readmitted due to change in position of  cortrak.  Anticipate DC back to home with current providers when able to tolerate feeds through cortrak again.    Expected Discharge Plan: Home w Home Health Services Barriers to Discharge: Continued Medical Work up   Patient Goals and CMS Choice Patient states their goals for this hospitalization and ongoing recovery are:: to return hom e          Expected Discharge Plan and Services   Discharge Planning Services: CM Consult   Living arrangements for the past 2 months: Single Family Home                   DME Agency: [facility]                  Prior Living Arrangements/Services Living arrangements for the past 2 months: Single Family Home Lives with:: Spouse              Current home services: DME, Home RN    Activities of Daily Living Home Assistive Devices/Equipment: None ADL Screening (condition at time of admission) Patient's cognitive ability adequate to safely complete daily activities?: Yes Is the patient deaf or have difficulty hearing?: No Does the patient have difficulty seeing, even when wearing glasses/contacts?: No Does the patient have difficulty concentrating, remembering, or making decisions?: No Patient able to express need for assistance with ADLs?: Yes Does the patient have difficulty dressing or bathing?: No Independently performs ADLs?: Yes (appropriate for developmental age) Does the patient have difficulty  walking or climbing stairs?: No Weakness of Legs: None Weakness of Arms/Hands: None  Permission Sought/Granted                  Emotional Assessment              Admission diagnosis:  Encounter for feeding tube placement [Z46.59] Intractable nausea and vomiting [R11.2] Chronic pancreatitis, unspecified pancreatitis type (HCC) [K86.1] Patient Active Problem List   Diagnosis Date Noted   Gastric outflow obstruction 02/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   Acute recurrent pancreatitis 02/13/2023   Intractable nausea and vomiting 02/13/2023   Displaced nasogastric feeding tube 02/12/2023   Esophageal stenosis 02/12/2023   Chronic pancreatitis (HCC) 02/12/2023   Leukocytosis 02/12/2023   Essential hypertension 02/12/2023   History of CAD (coronary artery disease) 02/12/2023   Atrial fibrillation, chronic -not on coagulation (HCC) 02/12/2023   Chronic disease anemia 02/12/2023   Encounter for feeding tube placement 02/12/2023   Malnutrition of moderate degree 02/01/2023   Nausea and vomiting 01/31/2023   Abnormal CT scan 01/31/2023   Acute pancreatitis 01/28/2023   Chronic diarrhea 12/05/2022   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   Hemorrhagic pancreatitis 11/30/2022   History of ERCP 11/30/2022   Biliary obstruction 11/30/2022   Moderate protein-calorie malnutrition (HCC) 11/30/2022   Gallstone pancreatitis 11/30/2022   Choledocholithiasis 11/21/2022   Acute gallstone pancreatitis 11/17/2022  PAF (paroxysmal atrial fibrillation) (HCC) 11/16/2021   Diabetes type 2, controlled (HCC) 05/15/2018   Hyperlipidemia, mixed 10/28/2015   CAD (coronary artery disease) 05/12/2013   GERD (gastroesophageal reflux disease) 05/12/2013   PCP:  Danella Penton, MD Pharmacy:   [facility] - [place], [place] - [address] [place] [address] Phone: [phone] Fax: [phone]     Social Determinants of Health (SDOH) Social  History: SDOH Screenings   Food Insecurity: No Food Insecurity (02/12/2023)  Housing: Low Risk  (02/12/2023)  Transportation Needs: No Transportation Needs (02/12/2023)  Utilities: Not At Risk (02/12/2023)  Depression (PHQ2-9): Low Risk  (11/17/2022)  Financial Resource Strain: Low Risk  (11/17/2022)  Physical Activity: Sufficiently Active (11/17/2022)  Social Connections: Socially Integrated (11/17/2022)  Stress: No Stress Concern Present (11/17/2022)  Tobacco Use: Low Risk  (02/12/2023)  Recent Concern: Tobacco Use - Medium Risk (01/25/2023)   Received from [facility]   SDOH Interventions:     Readmission Risk Interventions     No data to display

## 2023-02-16 ENCOUNTER — Encounter (HOSPITAL_COMMUNITY)
Admission: EM | Disposition: A | Discharge status: Home Health | Payer: Self-pay | Source: Home / Self Care | Attending: Internal Medicine

## 2023-02-16 ENCOUNTER — Inpatient Hospital Stay (HOSPITAL_COMMUNITY): Payer: HMO

## 2023-02-16 ENCOUNTER — Encounter (HOSPITAL_COMMUNITY): Payer: Self-pay | Admitting: Internal Medicine

## 2023-02-16 ENCOUNTER — Inpatient Hospital Stay (HOSPITAL_COMMUNITY): Payer: HMO | Admitting: Anesthesiology

## 2023-02-16 DIAGNOSIS — K859 Acute pancreatitis without necrosis or infection, unspecified: Secondary | ICD-10-CM | POA: Diagnosis not present

## 2023-02-16 DIAGNOSIS — I10 Essential (primary) hypertension: Secondary | ICD-10-CM

## 2023-02-16 DIAGNOSIS — I482 Chronic atrial fibrillation, unspecified: Secondary | ICD-10-CM | POA: Diagnosis not present

## 2023-02-16 DIAGNOSIS — R112 Nausea with vomiting, unspecified: Secondary | ICD-10-CM | POA: Diagnosis not present

## 2023-02-16 DIAGNOSIS — E119 Type 2 diabetes mellitus without complications: Secondary | ICD-10-CM | POA: Diagnosis not present

## 2023-02-16 DIAGNOSIS — I25118 Atherosclerotic heart disease of native coronary artery with other forms of angina pectoris: Secondary | ICD-10-CM | POA: Diagnosis not present

## 2023-02-16 DIAGNOSIS — D72829 Elevated white blood cell count, unspecified: Secondary | ICD-10-CM | POA: Diagnosis not present

## 2023-02-16 DIAGNOSIS — K2970 Gastritis, unspecified, without bleeding: Secondary | ICD-10-CM | POA: Diagnosis not present

## 2023-02-16 DIAGNOSIS — Z794 Long term (current) use of insulin: Secondary | ICD-10-CM

## 2023-02-16 DIAGNOSIS — E876 Hypokalemia: Secondary | ICD-10-CM

## 2023-02-16 HISTORY — PX: EUS: SHX5427

## 2023-02-16 HISTORY — PX: ESOPHAGOGASTRODUODENOSCOPY (EGD) WITH PROPOFOL: SHX5813

## 2023-02-16 LAB — GLUCOSE, CAPILLARY
Glucose-Capillary: 164 mg/dL — ABNORMAL HIGH (ref 70–99)
Glucose-Capillary: 161 mg/dL — ABNORMAL HIGH (ref 70–99)
Glucose-Capillary: 164 mg/dL — ABNORMAL HIGH (ref 70–99)
Glucose-Capillary: 185 mg/dL — ABNORMAL HIGH (ref 70–99)
Glucose-Capillary: 226 mg/dL — ABNORMAL HIGH (ref 70–99)

## 2023-02-16 SURGERY — ESOPHAGOGASTRODUODENOSCOPY (EGD) WITH PROPOFOL
Anesthesia: General

## 2023-02-16 MED ORDER — PROPOFOL 10 MG/ML IV BOLUS
INTRAVENOUS | Status: DC | PRN
Start: 2023-02-16 — End: 2023-02-16
  Administered 2023-02-16: 80 mg via INTRAVENOUS

## 2023-02-16 MED ORDER — TRAVASOL 10 % IV SOLN
INTRAVENOUS | Status: AC
  Filled 2023-02-16: qty 673.2

## 2023-02-16 MED ORDER — PHENYLEPHRINE 80 MCG/ML (10ML) SYRINGE FOR IV PUSH (FOR BLOOD PRESSURE SUPPORT)
PREFILLED_SYRINGE | INTRAVENOUS | Status: DC | PRN
  Administered 2023-02-16 (×3): 160 ug via INTRAVENOUS
  Administered 2023-02-16: 80 ug via INTRAVENOUS

## 2023-02-16 MED ORDER — SUGAMMADEX SODIUM 200 MG/2ML IV SOLN
INTRAVENOUS | Status: DC | PRN
  Administered 2023-02-16: 200 mg via INTRAVENOUS

## 2023-02-16 MED ORDER — LACTATED RINGERS IV SOLN
INTRAVENOUS | Status: AC | PRN
  Administered 2023-02-16: 10 mL/h via INTRAVENOUS

## 2023-02-16 MED ORDER — ONDANSETRON HCL 4 MG/2ML IJ SOLN
INTRAMUSCULAR | Status: DC | PRN
  Administered 2023-02-16: 4 mg via INTRAVENOUS

## 2023-02-16 MED ORDER — CIPROFLOXACIN IN D5W 400 MG/200ML IV SOLN
INTRAVENOUS | Status: AC
  Filled 2023-02-16: qty 200

## 2023-02-16 MED ORDER — POTASSIUM CHLORIDE 10 MEQ/50ML IV SOLN
10.0000 meq | INTRAVENOUS | Status: AC
  Administered 2023-02-16 (×3): 10 meq via INTRAVENOUS
  Filled 2023-02-16 (×4): qty 50

## 2023-02-16 MED ORDER — DEXAMETHASONE SODIUM PHOSPHATE 10 MG/ML IJ SOLN
INTRAMUSCULAR | Status: DC | PRN
  Administered 2023-02-16: 5 mg via INTRAVENOUS

## 2023-02-16 MED ORDER — PHENOL 1.4 % MT LIQD
1.0000 | OROMUCOSAL | Status: DC | PRN
  Filled 2023-02-16: qty 177

## 2023-02-16 MED ORDER — POTASSIUM CHLORIDE 10 MEQ/100ML IV SOLN
INTRAVENOUS | Status: AC
  Filled 2023-02-16: qty 100

## 2023-02-16 MED ORDER — POTASSIUM PHOSPHATES 15 MMOLE/5ML IV SOLN
15.0000 mmol | Freq: Once | INTRAVENOUS | Status: AC
  Administered 2023-02-16: 15 mmol via INTRAVENOUS
  Filled 2023-02-16: qty 5

## 2023-02-16 MED ORDER — PANTOPRAZOLE SODIUM 40 MG IV SOLR
40.0000 mg | Freq: Two times a day (BID) | INTRAVENOUS | Status: DC
  Administered 2023-02-16 – 2023-02-25 (×19): 40 mg via INTRAVENOUS
  Filled 2023-02-16 (×19): qty 10

## 2023-02-16 MED ORDER — FENTANYL CITRATE (PF) 100 MCG/2ML IJ SOLN
INTRAMUSCULAR | Status: DC | PRN
Start: 2023-02-16 — End: 2023-02-16
  Administered 2023-02-16: 50 ug via INTRAVENOUS

## 2023-02-16 MED ORDER — SODIUM CHLORIDE 0.9 % IV SOLN: INTRAVENOUS | Status: DC

## 2023-02-16 MED ORDER — POTASSIUM CHLORIDE 10 MEQ/50ML IV SOLN
10.0000 meq | Freq: Once | INTRAVENOUS | Status: AC
  Administered 2023-02-16: 10 meq via INTRAVENOUS
  Filled 2023-02-16: qty 50

## 2023-02-16 MED ORDER — FENTANYL CITRATE (PF) 100 MCG/2ML IJ SOLN
INTRAMUSCULAR | Status: AC
  Filled 2023-02-16: qty 2

## 2023-02-16 MED ORDER — ROCURONIUM BROMIDE 10 MG/ML (PF) SYRINGE
PREFILLED_SYRINGE | INTRAVENOUS | Status: DC | PRN
  Administered 2023-02-16: 30 mg via INTRAVENOUS
  Administered 2023-02-16: 10 mg via INTRAVENOUS

## 2023-02-16 MED ORDER — SUCCINYLCHOLINE CHLORIDE 200 MG/10ML IV SOSY
PREFILLED_SYRINGE | INTRAVENOUS | Status: DC | PRN
  Administered 2023-02-16: 60 mg via INTRAVENOUS

## 2023-02-16 SURGICAL SUPPLY — 15 items

## 2023-02-16 NOTE — Interval H&P Note (Signed)
History and Physical Interval Note:  02/16/2023 11:33 AM  Jon Bray  has presented today for surgery, with the diagnosis of pseudocyst drainage/aspiration.  The various methods of treatment have been discussed with the patient and family. After consideration of risks, benefits and other options for treatment, the patient has consented to  Procedure(s): ESOPHAGOGASTRODUODENOSCOPY (EGD) WITH PROPOFOL (N/A) UPPER ENDOSCOPIC ULTRASOUND (EUS) LINEAR (N/A) as a surgical intervention.  The patient's history has been reviewed, patient examined, no change in status, stable for surgery.  I have reviewed the patient's chart and labs.  Questions were answered to the patient's satisfaction.    The risks of an EUS, including intestinal perforation, bleeding, infection, aspiration, and medication effects were discussed.  When a cystgastrostomy/cystenterostomy is performed as part of the EUS, there is an additional risk of pancreatitis at the rate of about 1-2%.  It was explained that procedure related pancreatitis is typically mild, although at times it can be severe and even life threatening.   [employer]

## 2023-02-16 NOTE — Anesthesia Preprocedure Evaluation (Addendum)
Anesthesia Evaluation  Patient identified by MRN, date of birth, ID band Patient awake    Reviewed: Allergy & Precautions, NPO status , Patient's Chart, lab work & pertinent test results  Airway Mallampati: I  TM Distance: <3 FB     Dental  (+) Edentulous Upper, Chipped, Poor Dentition, Dental Advisory Given   Pulmonary neg pulmonary ROS   Pulmonary exam normal breath sounds clear to auscultation       Cardiovascular hypertension, + angina  + CAD, + Past MI and + Cardiac Stents  + dysrhythmias Atrial Fibrillation and Ventricular Tachycardia  Rhythm:Regular Rate:Normal     Neuro/Psych negative neurological ROS  negative psych ROS   GI/Hepatic Neg liver ROS,GERD  ,,  Endo/Other  diabetes, Type 2, Insulin Dependent    Renal/GU Renal InsufficiencyRenal disease  negative genitourinary   Musculoskeletal negative musculoskeletal ROS (+)    Abdominal Normal abdominal exam  (+)   Peds  Hematology  (+) Blood dyscrasia, anemia   Anesthesia Other Findings   Reproductive/Obstetrics                             Anesthesia Physical Anesthesia Plan  ASA: 3  Anesthesia Plan: General   Post-op Pain Management: Minimal or no pain anticipated   Induction: Intravenous  PONV Risk Score and Plan: 2 and Treatment may vary due to age or medical condition and Ondansetron  Airway Management Planned: Oral ETT  Additional Equipment:   Intra-op Plan:   Post-operative Plan: Extubation in OR  Informed Consent: I have reviewed the patients History and Physical, chart, labs and discussed the procedure including the risks, benefits and alternatives for the proposed anesthesia with the patient or authorized representative who has indicated his/her understanding and acceptance.     Dental advisory given  Plan Discussed with: CRNA  Anesthesia Plan Comments:        Anesthesia Quick Evaluation

## 2023-02-16 NOTE — Progress Notes (Signed)
PHARMACY - TOTAL PARENTERAL NUTRITION CONSULT NOTE  Indication: Gallstone pancreatitis with GOO  Patient Measurements: Height: 5\' 8"  (172.7 cm) Weight: 67.6 kg (149 lb 0.5 oz) IBW/kg (Calculated) : 68.4 TPN AdjBW (KG): 66.7 Body mass index is 22.66 kg/m.  Assessment:  49 YOM presented on 8/9 with NGT displacement.  Patient/family reports admitted in May for 21 days for severe pancreatitis.  He was discharged on an oral diet and did not eat well.  He was readmitted in July, from which he was discharged on tube feeding.  He was on TF for 3-4 days before he vomited and the NGT was dislodged.  Patient/family reports losing ~40 lbs since May and his intake has been poor since.   RD and IR attempted post-pyloric tube placement and were unsuccessful.  Pharmacy consulted to manage TPN for pancreatitic pseudocyst and GOO.  Glucose / Insulin: DM2 on Basaglar 10/d PTA.  CBGs < 180, trending up with TPN. Received 3 units SSI yesterday, 9 units since TPN started Electrolytes: K down to 3.4 post 4 runs, Mag up to 2.2 post 2gm, others WNL B12 WNL Renal: SCr < 1, BUN WNL Hepatic: AST/ALT elevated (not d/t TPN), alk phos / tbili WNL, lipase 262, albumin 2.5 Intake / Output; MIVF: NG GI Imaging: none since TPN initiation GI Surgeries / Procedures: none since TPN initiation   Central access: PICC placed on 02/15/23 TPN start date: 02/15/23  Nutritional Goals:  RD Estimated Needs Total Energy Estimated Needs: 2100-2300 kcal/d Total Protein Estimated Needs: 105-125 g/d Total Fluid Estimated Needs: 2.1-2.3L/d  Current Nutrition:  TPN  Plan:  Increase TPN to 55 mL/hr at 1800 (goal rate 85 ml/hr) Electrolytes in TPN: Na 121mEq/L, K 70mEq/L, Ca 54mEq/L, Mg 33mEq/L, Phos 52mmol/L. Cl:Ac 1:1 - all lytes increased with increasing TPN rate Add standard MVI and trace elements to TPN Continue moderate SSI Q4H and add 10 units of regular insulin to TPN KCL x 4 runs KPhos IV Thiamine 100mg  IV  daily x 5 days or until TPN is at goal rate Standard TPN labs on Mon/Thurs - labs in AM F/U EUS   D. Laney Potash, PharmD, BCPS, BCCCP 02/16/2023, 8:35 AM

## 2023-02-16 NOTE — Anesthesia Postprocedure Evaluation (Signed)
Anesthesia Post Note  Patient: Jon Bray  Procedure(s) Performed: ESOPHAGOGASTRODUODENOSCOPY (EGD) WITH PROPOFOL UPPER ENDOSCOPIC ULTRASOUND (EUS) LINEAR     Patient location during evaluation: Endoscopy Anesthesia Type: General Level of consciousness: awake and alert Pain management: pain level controlled Vital Signs Assessment: post-procedure vital signs reviewed and stable Respiratory status: spontaneous breathing, nonlabored ventilation, respiratory function stable and patient connected to nasal cannula oxygen Cardiovascular status: blood pressure returned to baseline and stable Postop Assessment: no apparent nausea or vomiting Anesthetic complications: no  No notable events documented.  Last Vitals:  Vitals:   02/16/23 1405 02/16/23 1410  BP: 109/62 110/62  Pulse: 74 75  Resp: 12 14  Temp:    SpO2: 92% 90%    Last Pain:  Vitals:   02/16/23 1410  TempSrc:   PainSc: 0-No pain                  L 

## 2023-02-16 NOTE — Progress Notes (Signed)
PROGRESS NOTE    Jon Bray  WUJ:[id number] DOB: [birth year] DOA: 02/11/2023 PCP: Danella Penton, MD    Chief Complaint  Patient presents with   NG Tube Displacement    Brief Narrative:  82 y.o. male  with medical history significant of esophageal stenosis s/p Cortrack NG-tube placed on 02/02/2023, insulin-dependent DM type II, essential hypertension, CAD, paroxysmal atrial fibrillation-not on anticoagulation, gallstone pancreatitis status post cholecystectomy May 2024, chronic anemia, chronic hyponatremia and moderate malnutrition presented to emergency department due to vomiting and displaced NG tube.  Patient had recent hospital admission 01/28/2023 to 02/08/2023 due to AKI, dehydration, acute pancreatitis. Patient underwent EGD 7/29 showed esophageal stenosis, hiatal hernia and ulcerated gastric fundus with congested mucosa. As patient did not tolerated oral diet well feeding tube Cortrack placed on 7/31 and to continue liquid diet as tolerated. Patient was discharged home with [facility] and supposed to be follow-up with GI on 02/22/2023.  CT scan 8/10 shows inflammatory changes with stranding of the fat surrounding had an aggressive process and peripancreatic fluid collection.  [facility] GI team has been consulted.  Patient underwent core track tube placement however unable to be advanced and tried under fluoroscopy which was unsuccessful. -Patient being followed by GI and patient for EUS today 02/16/2023 for either drainage of pancreatic pseudocyst versus pancreatic cyst gastrostomy.   Assessment & Plan:   Principal Problem:   Encounter for feeding tube placement Active Problems:   Displaced nasogastric feeding tube   Esophageal stenosis   Chronic pancreatitis (HCC)   Diabetes type 2, controlled (HCC)   Leukocytosis   Essential hypertension   History of CAD (coronary artery disease)   Atrial fibrillation, chronic -not on coagulation (HCC)   Chronic disease anemia   Pancreatic pseudocyst    Acute recurrent pancreatitis   Intractable nausea and vomiting   Gastric outflow obstruction  #1 recurrent pancreatic pseudocyst/gastroduodenal outlet obstruction -Patient noted to have peripancreatic fluid collection/gastric outlet obstruction and at this time recommending against enteric feeding for now. -Nasoenteric tube was attempted however unable to get to postpyloric and as such GI removed tube at bedside 02/15/2023. -Continue TPN for nutrition. -Patient being followed by GI and patient for EUS today, 02/16/2023, for either drainage of pancreatic pseudocyst via transgastric approach versus pancreatic cyst gastrostomy with GI and patient currently NPO. -Continue scheduled Zofran. -Per GI.  2.  Esophageal stenosis/acute on chronic nausea/vomiting -Secondary to problem #1. -Continue scheduled Zofran. -See #1.  3.  Well-controlled diabetes mellitus type 2 -Hemoglobin A1c 5.8 (11/19/2022) -CBG 164 this morning. -Patient currently NPO. -Continue to hold long-acting insulin while NPO. -SSI.  4.  Chronic A-fib -Currently rate controlled. -Not on anticoagulation. -Aspirin on hold.  5.  History of CAD -Currently asymptomatic. -Aspirin on hold.  6.  Chronic normocytic anemia -H/H stable.  7.  8 mm nodule, right posterior bladder wall -Outpatient follow-up with urology.  8.  Leukocytosis -Likely reactive leukocytosis secondary to problem #1. -Patient currently afebrile. -Patient with no urinary symptoms, no respiratory symptoms. -Patient for EUS today and possible drainage of pancreatic pseudocyst. -Monitor off antibiotics at this time.  9.  Hypokalemia -Being repleted per pharmacy as patient on TPN.    DVT prophylaxis: Heparin Code Status: Full Family Communication: Updated patient and wife at bedside. Disposition: TBD  Status is: Inpatient Remains inpatient appropriate because: Severity of illness   Consultants:  Gastroenterology: Dr. Leone Payor  02/12/2023   Procedures:  CT abdomen and pelvis 02/12/2023 Abdominal films 02/13/2023 NG tube placement under fluoroscopy attempt  02/14/2023.  Dr. Ova Freshwater, IR   Antimicrobials:  Anti-infectives (From admission, onward)    None         Subjective: Laying in recliner.  Denies any chest pain or shortness of breath.  Denies any abdominal pain at the moment.  States last pain medication was last night.  Stated had a bout of nausea and vomiting early on this morning before the son came up.  States felt better after nausea and vomiting.  Wife at bedside.  Awaiting procedure this morning.  Objective: Vitals:   02/15/23 1641 02/15/23 2017 02/16/23 0407 02/16/23 0732  BP: 138/79 137/67 129/83 116/73  Pulse: 79 74 64 71  Resp:  18 18 18   Temp: 98.4 F (36.9 C) 98.2 F (36.8 C) 98.3 F (36.8 C) 97.7 F (36.5 C)  TempSrc: Oral Oral Oral Oral  SpO2: 96% 96% 96% 96%  Weight:      Height:        Intake/Output Summary (Last 24 hours) at 02/16/2023 1023 Last data filed at 02/16/2023 0154 Gross per 24 hour  Intake 50 ml  Output 300 ml  Net -250 ml   Filed Weights   02/11/23 1939 02/15/23 0500  Weight: 66.7 kg 67.6 kg    Examination:  General exam: Appears calm and comfortable  Respiratory system: Slight right basilar crackles.  No wheezing.  Fair air movement.  Speaking in full sentences.  No use of accessory muscles of respiration.  Cardiovascular system: S1 & S2 heard, RRR. No JVD, murmurs, rubs, gallops or clicks. No pedal edema. Gastrointestinal system: Abdomen is nondistended, soft and nontender. No organomegaly or masses felt. Normal bowel sounds heard. Central nervous system: Alert and oriented. No focal neurological deficits. Extremities: Symmetric 5 x 5 power. Skin: No rashes, lesions or ulcers Psychiatry: Judgement and insight appear normal. Mood & affect appropriate.     Data Reviewed: I have personally reviewed following labs and imaging studies  CBC: Recent  Labs  Lab 02/11/23 2151 02/12/23 0406 02/13/23 0044 02/14/23 0046 02/15/23 0136 02/16/23 0353  WBC 15.7* 12.1* 10.7* 10.1 11.8* 13.6*  NEUTROABS 10.6*  --   --   --   --   --   HGB 11.8* 10.5* 10.6* 10.3* 10.5* 10.2*  HCT 35.8* 32.9* 33.1* 31.8* 31.7* 31.8*  MCV 86.9 85.7 84.4 85.5 84.8 84.8  PLT 377 299 323 313 332 363    Basic Metabolic Panel: Recent Labs  Lab 02/12/23 0406 02/13/23 0044 02/14/23 0046 02/15/23 0136 02/16/23 0353  NA 135 134* 133* 133* 136  K 4.0 3.7 3.6 3.6 3.4*  CL 97* 100 100 100 101  CO2 22 23 22 23 26   GLUCOSE 95 83 127* 117* 177*  BUN 22 19 20 17 18   CREATININE 0.80 0.80 0.83 0.72 0.70  CALCIUM 8.5* 8.4* 8.2* 8.1* 8.2*  MG  --   --  2.0 1.8 2.2  PHOS  --   --  4.3 3.6 2.9    GFR: Estimated Creatinine Clearance: 66.9 mL/min (by C-G formula based on SCr of 0.7 mg/dL).  Liver Function Tests: Recent Labs  Lab 02/11/23 2151 02/12/23 0406 02/13/23 0044 02/16/23 0353  AST 30 27 32 58*  ALT 35 29 35 78*  ALKPHOS 77 61 67 66  BILITOT 0.4 0.5 1.0 1.1  PROT 7.5 6.5 6.5 6.4*  ALBUMIN 2.8* 2.4* 2.5* 2.5*    CBG: Recent Labs  Lab 02/15/23 1746 02/15/23 2027 02/15/23 2318 02/16/23 0408 02/16/23 0732  GLUCAP 144* 169* 166* 164*  161*     No results found for this or any previous visit (from the past 240 hour(s)).       Radiology Studies: [place] EKG SITE RITE  Result Date: 02/15/2023 If Site Rite image not attached, placement could not be confirmed due to current cardiac rhythm.  DG Naso G Tube Plc W/Fl W/Rad  Result Date: 02/14/2023 CLINICAL DATA:  Feeding tube placement EXAM: NASO G TUBE PLACEMENT WITH FL AND WITH RAD CONTRAST:  50mL OMNIPAQUE IOHEXOL 300 MG/ML  SOLN FLUOROSCOPY: Radiation Exposure Index (if provided by the fluoroscopic device): 38.6 mGy air kerma COMPARISON:  Abdomen radiograph 02/13/2023 FINDINGS: Feeding tube advanced into the stomach antrum but despite multiple attempts by various operators including Dr.  Ova Freshwater, the use of contrast, the use of wires, turning the patient, and other maneuvers, we were not able to cause the tube to advance into the duodenum. There seems to be either stenosis or an unusual turn at the junction of the stomach and duodenum which could not be easily preferentially selected, possibly due to mass effect from the adjacent pseudocyst. After over 8 minutes of fluoroscopy time, further advancement attempts were felt to be futile, and the tube was left in position with tip in the stomach antrum. Post pyloric placement will likely require endoscopic assistance. IMPRESSION: 1. Feeding tube tip is in the stomach antrum. The tip could not be advanced into the duodenum despite extensive maneuvers and over 8 minutes of fluoroscopy time representing cumulative attempts. Post pyloric placement will likely require endoscopic assistance. Electronically Signed   By: Gaylyn Rong M.D.   On: 02/14/2023 15:18        Scheduled Meds:  bisacodyl  10 mg Rectal Once   Chlorhexidine Gluconate Cloth  6 each Topical Daily   heparin  5,000 Units Subcutaneous Q8H   insulin aspart  0-15 Units Subcutaneous Q4H   sodium chloride flush  10-40 mL Intracatheter Q12H   sodium chloride flush  3 mL Intravenous Q12H   thiamine (VITAMIN B1) injection  100 mg Intravenous Daily   Continuous Infusions:  sodium chloride     sodium chloride 20 mL/hr at 02/16/23 1022   potassium chloride     potassium PHOSPHATE IVPB (in mmol)     promethazine (PHENERGAN) injection (IM or IVPB) 12.5 mg (02/16/23 0154)   TPN ADULT (ION) 30 mL/hr at 02/15/23 1726   TPN ADULT (ION)       LOS: 4 days    Time spent: 35 minutes    Ramiro Harvest, MD [facility]   To contact the attending provider between 7A-7P or the covering provider during after hours 7P-7A, please log into the web site [email or web address] and access using universal [place] password for that web site. If you do not have the password, please  call the hospital operator.  02/16/2023, 10:23 AM

## 2023-02-16 NOTE — Anesthesia Procedure Notes (Signed)
Procedure Name: Intubation Date/Time: 02/16/2023 12:39 PM  Performed by: Orlin Hilding, CRNAPre-anesthesia Checklist: Patient identified, Emergency Drugs available, Suction available, Patient being monitored and Timeout performed Patient Re-evaluated:Patient Re-evaluated prior to induction Oxygen Delivery Method: Circle system utilized Preoxygenation: Pre-oxygenation with 100% oxygen Induction Type: IV induction, Rapid sequence and Cricoid Pressure applied Laryngoscope Size: Mac and 4 Grade View: Grade I Tube type: Oral Tube size: 7.5 mm Number of attempts: 1 Airway Equipment and Method: Stylet Placement Confirmation: ETT inserted through vocal cords under direct vision, positive ETCO2 and breath sounds checked- equal and bilateral Secured at: 22 cm Tube secured with: Tape Dental Injury: Teeth and Oropharynx as per pre-operative assessment

## 2023-02-16 NOTE — Transfer of Care (Signed)
Immediate Anesthesia Transfer of Care Note  Patient: Jon Bray  Procedure(s) Performed: ESOPHAGOGASTRODUODENOSCOPY (EGD) WITH PROPOFOL UPPER ENDOSCOPIC ULTRASOUND (EUS) LINEAR  Patient Location: Endoscopy Unit  Anesthesia Type:General  Level of Consciousness: awake and drowsy  Airway & Oxygen Therapy: Patient Spontanous Breathing  Post-op Assessment: Report given to RN and Post -op Vital signs reviewed and stable  Post vital signs: Reviewed and stable  Last Vitals:  Vitals Value Taken Time  BP 115/63 02/16/23 1351  Temp    Pulse 78 02/16/23 1352  Resp 20 02/16/23 1352  SpO2 94 % 02/16/23 1352  Vitals shown include unfiled device data.  Last Pain:  Vitals:   02/16/23 1123  TempSrc: Temporal  PainSc: 0-No pain      Patients Stated Pain Goal: 0 (02/16/23 0800)  Complications: No notable events documented.

## 2023-02-16 NOTE — Op Note (Signed)
[facility] Patient Name: Jon Bray Procedure Date : 02/16/2023 MRN: [id number] Attending MD: Corliss Parish , MD, [id number] Date of Birth: 1940-11-15 CSN: [id number] Age: 82 Admit Type: Inpatient Procedure:                Upper EUS Indications:              Pancreatic cyst on CT scan, Abnormal                            abdominal/pelvic CT scan, Generalized abdominal                            distress, Abnormal CT of the GI tract,                            Malnutrition, Nausea with vomiting, Weight loss Providers:                Corliss Parish, MD, Fransisca Connors, Rozetta Nunnery, Technician Referring MD:              Medicines:                General Anesthesia Complications:            No immediate complications. Estimated Blood Loss:     Estimated blood loss was minimal. Procedure:                Pre-Anesthesia Assessment:                           - Prior to the procedure, a History and Physical                            was performed, and patient medications and                            allergies were reviewed. The patient's tolerance of                            previous anesthesia was also reviewed. The risks                            and benefits of the procedure and the sedation                            options and risks were discussed with the patient.                            All questions were answered, and informed consent                            was obtained. Prior Anticoagulants: The patient has                            taken  heparin, last dose was day of procedure. ASA                            Grade Assessment: III - A patient with severe                            systemic disease. After reviewing the risks and                            benefits, the patient was deemed in satisfactory                            condition to undergo the procedure.                           After obtaining informed  consent, the endoscope was                            passed under direct vision. Throughout the                            procedure, the patient's blood pressure, pulse, and                            oxygen saturations were monitored continuously. The                            [id number]-[id number] ([id number]) Olympus endoscope was                            introduced through the mouth, and advanced to the                            fourth part of duodenum. The [id number] ([id number])                            Olympus duodenoscope was introduced through the                            mouth, and advanced to the area of papilla. The                            [id number] ([id number]) Olympus linear ultrasound scope                            was introduced through the mouth, and advanced to                            the duodenum for ultrasound examination from the                            stomach and duodenum. The upper EUS was  accomplished without difficulty. The patient                            tolerated the procedure. Scope In: Scope Out: Findings:      ENDOSCOPIC FINDING: :      No gross lesions were noted in the proximal esophagus and in the mid       esophagus.      LA Grade C (one or more mucosal breaks continuous between tops of 2 or       more mucosal folds, less than 75% circumference) esophagitis with no       bleeding was found in the distal esophagus.      The Z-line was irregular and was found 43 cm from the incisors.      A 3 cm hiatal hernia was present.      A distention deformity was found in the entire examined stomach.      Retained fluid was found in the entire examined stomach. Suction via       Endoscope was performed with 700 cc being removed.      A mild extrinsic impression/compression on the stomach was found in the       gastric antrum with significant findings of congestion in this region.      Patchy moderately erythematous mucosa without  bleeding was found in the       entire examined stomach.      No gross lesions were noted in the duodenal bulb, in the first portion       of the duodenum, in the second portion of the duodenum, in the third       portion of the duodenum and in the fourth portion of the duodenum.      There was evidence of a patent previous surgical anastomosis in the       major papilla.      ENDOSONOGRAPHIC FINDING: :      Pancreatic parenchymal abnormalities were noted in the entire pancreas.       These consisted of atrophy, lobularity without honeycombing, cysts and       hyperechoic strands.      A multicystic, shadowing and distally enhancing lesion suggestive of a       cyst was identified in the pancreatic head/genu of the pancreas. It does       not communicate with the pancreatic duct. The area measured 68 mm by 40       mm in maximal cross-sectional diameter. Multiple thinly septated. The       outer wall of the lesion was thin. There was no associated mass. There       was internal debris within the fluid-filled cavity. This is consistent       with a walled-off necrosis. As I looked to try and find an adequate       window for pseudocystgastrostomy I could not visualize this. This      Endosonographic imaging in the visualized portion of the liver showed no       mass.      After the end of the EUS, the endoscope was replaced and a Dobhoff       nasoduodenal tube was placed through the nares into the esophagus. Under       endoscopic guidance, the tube was advanced into the duodenum. Placement       was confirmed by scope visualization. Impression:  EGD Impression:                           - No gross lesions in the proximal esophagus and in                            the mid esophagus.                           - LA Grade C erosive esophagitis with no bleeding                            found distally.                           - Z-line irregular, 43 cm from the incisors.                            - 3 cm hiatal hernia.                           - Distention deformity in the entire stomach.                           - Retained gastric fluid - 700 cc removed.                           - Extrinsic compression/impression in the gastric                            antrum.                           - Erythematous mucosa in the stomach.                           - No gross lesions in the duodenal bulb, in the                            first portion of the duodenum, in the second                            portion of the duodenum, in the third portion of                            the duodenum and in the fourth portion of the                            duodenum.                           - Patent previous sphincterotomy noted.                           EUS Impression:                           -  Pancreatic parenchymal abnormalities consisting                            of atrophy, lobularity, cysts and hyperechoic                            strands were noted in the entire pancreas.                           - A cystic lesion was seen in the pancreatic head                            and genu of the pancreas. Tissue has not been                            obtained. However, the endosonographic appearance                            is consistent with a pancreatic pseudocyst.                           - Feeding tube placement was successfully performed. Recommendation:           - The patient will be observed post-procedure,                            until all discharge criteria are met.                           - Return patient to hospital ward for ongoing care.                           - Obtain KUB (non-portable) this afternoon for                            visualization of the Dobhoff tube.                           - Trickle feeds may be possible, while maintaining                            TPN as long as it does not dislodge into the                             stomach.                           - Recommend repeat imaging by early this weekend.                            CT-Abdomen Pancreas protocol.                           - I suspect that with the amount of WON that is  present and the way that it is multiseptated, that                            this will likely require DPT Pseudocystgastrostomy                            creation rather than AXIOS stenting. I do not                            perform DPT therapy without cystatome availability.                            Thus if patient continues to not tolerate oral                            feeding while on TPN, he will need to be referred                            to [facility] at [facility] to have                            attempt at this (not clear if this needs to be as                            an inpatient vs outpatient unless he continues to                            fail).                           - Continue Protonix BID for now.                           - Continue medications otherwise.                           - The findings and recommendations were discussed                            with the patient.                           - The findings and recommendations were discussed                            with the referring physician. Procedure Code(s):        --- Professional ---                           [phone], Esophagogastroduodenoscopy, flexible,                            transoral; with endoscopic ultrasound examination  limited to the esophagus, stomach or duodenum, and                            adjacent structures                           43241, Esophagogastroduodenoscopy, flexible,                            transoral; with insertion of intraluminal tube or                            catheter Diagnosis Code(s):        --- Professional ---                           K20.80, Other esophagitis  without bleeding                           K22.89, Other specified disease of esophagus                           K44.9, Diaphragmatic hernia without obstruction or                            gangrene                           K31.89, Other diseases of stomach and duodenum                           Z98.0, Intestinal bypass and anastomosis status                           K86.2, Cyst of pancreas                           K86.9, Disease of pancreas, unspecified                           R10.84, Generalized abdominal pain                           E46, Unspecified protein-calorie malnutrition                           R11.2, Nausea with vomiting, unspecified                           R63.4, Abnormal weight loss                           R93.5, Abnormal findings on diagnostic imaging of                            other abdominal regions, including retroperitoneum  R93.3, Abnormal findings on diagnostic imaging of                            other parts of digestive tract CPT copyright 2022 [employer]. All rights reserved. The codes documented in this report are preliminary and upon coder review may  be revised to meet current compliance requirements. Corliss Parish, MD 02/16/2023 2:03:04 PM Number of Addenda: 0

## 2023-02-16 NOTE — Plan of Care (Signed)

## 2023-02-17 ENCOUNTER — Ambulatory Visit: Payer: HMO

## 2023-02-17 DIAGNOSIS — K862 Cyst of pancreas: Secondary | ICD-10-CM

## 2023-02-17 DIAGNOSIS — D72829 Elevated white blood cell count, unspecified: Secondary | ICD-10-CM | POA: Diagnosis not present

## 2023-02-17 DIAGNOSIS — I482 Chronic atrial fibrillation, unspecified: Secondary | ICD-10-CM | POA: Diagnosis not present

## 2023-02-17 DIAGNOSIS — K208 Other esophagitis without bleeding: Secondary | ICD-10-CM | POA: Diagnosis not present

## 2023-02-17 DIAGNOSIS — E46 Unspecified protein-calorie malnutrition: Secondary | ICD-10-CM | POA: Diagnosis not present

## 2023-02-17 DIAGNOSIS — R112 Nausea with vomiting, unspecified: Secondary | ICD-10-CM | POA: Diagnosis not present

## 2023-02-17 DIAGNOSIS — K311 Adult hypertrophic pyloric stenosis: Secondary | ICD-10-CM | POA: Diagnosis not present

## 2023-02-17 DIAGNOSIS — R634 Abnormal weight loss: Secondary | ICD-10-CM | POA: Diagnosis not present

## 2023-02-17 DIAGNOSIS — K859 Acute pancreatitis without necrosis or infection, unspecified: Secondary | ICD-10-CM | POA: Diagnosis not present

## 2023-02-17 DIAGNOSIS — K863 Pseudocyst of pancreas: Secondary | ICD-10-CM | POA: Diagnosis not present

## 2023-02-17 LAB — COMPREHENSIVE METABOLIC PANEL
ALT: 81 U/L — ABNORMAL HIGH (ref 0–44)
AST: 51 U/L — ABNORMAL HIGH (ref 15–41)
Albumin: 2.2 g/dL — ABNORMAL LOW (ref 3.5–5.0)
Alkaline Phosphatase: 52 U/L (ref 38–126)
Anion gap: 9 (ref 5–15)
BUN: 17 mg/dL (ref 8–23)
CO2: 27 mmol/L (ref 22–32)
Calcium: 8.1 mg/dL — ABNORMAL LOW (ref 8.9–10.3)
Chloride: 102 mmol/L (ref 98–111)
Creatinine, Ser: 0.76 mg/dL (ref 0.61–1.24)
GFR, Estimated: >60 mL/min (ref >60)
Glucose, Bld: 161 mg/dL — ABNORMAL HIGH (ref 70–99)
Potassium: 3.5 mmol/L (ref 3.5–5.1)
Sodium: 138 mmol/L (ref 135–145)
Total Bilirubin: 0.6 mg/dL (ref 0.3–1.2)
Total Protein: 6 g/dL — ABNORMAL LOW (ref 6.5–8.1)

## 2023-02-17 LAB — GLUCOSE, CAPILLARY
Glucose-Capillary: 162 mg/dL — ABNORMAL HIGH (ref 70–99)
Glucose-Capillary: 171 mg/dL — ABNORMAL HIGH (ref 70–99)
Glucose-Capillary: 173 mg/dL — ABNORMAL HIGH (ref 70–99)
Glucose-Capillary: 174 mg/dL — ABNORMAL HIGH (ref 70–99)
Glucose-Capillary: 177 mg/dL — ABNORMAL HIGH (ref 70–99)
Glucose-Capillary: 181 mg/dL — ABNORMAL HIGH (ref 70–99)
Glucose-Capillary: 203 mg/dL — ABNORMAL HIGH (ref 70–99)

## 2023-02-17 LAB — CBC
HCT: 30.3 % — ABNORMAL LOW (ref 39.0–52.0)
Hemoglobin: 9.9 g/dL — ABNORMAL LOW (ref 13.0–17.0)
MCH: 27.8 pg (ref 26.0–34.0)
MCHC: 32.7 g/dL (ref 30.0–36.0)
MCV: 85.1 fL (ref 80.0–100.0)
Platelets: 339 10*3/uL (ref 150–400)
RBC: 3.56 MIL/uL — ABNORMAL LOW (ref 4.22–5.81)
RDW: 14.7 % (ref 11.5–15.5)
WBC: 12.5 10*3/uL — ABNORMAL HIGH (ref 4.0–10.5)
nRBC: 0 % (ref 0.0–0.2)

## 2023-02-17 LAB — MAGNESIUM: Magnesium: 2.1 mg/dL (ref 1.7–2.4)

## 2023-02-17 LAB — PHOSPHORUS: Phosphorus: 3.3 mg/dL (ref 2.5–4.6)

## 2023-02-17 MED ORDER — TRAVASOL 10 % IV SOLN
INTRAVENOUS | Status: AC
  Filled 2023-02-17: qty 1040.4

## 2023-02-17 MED ORDER — OSMOLITE 1.5 CAL PO LIQD
1000.0000 mL | ORAL | Status: DC
  Administered 2023-02-17: 1000 mL
  Filled 2023-02-17 (×2): qty 1000

## 2023-02-17 MED ORDER — BISACODYL 10 MG RE SUPP
10.0000 mg | Freq: Once | RECTAL | Status: DC
  Filled 2023-02-17: qty 1

## 2023-02-17 MED ORDER — PROCHLORPERAZINE EDISYLATE 10 MG/2ML IJ SOLN
5.0000 mg | Freq: Four times a day (QID) | INTRAMUSCULAR | Status: DC | PRN
  Administered 2023-02-23 (×2): 5 mg via INTRAVENOUS
  Filled 2023-02-17 (×2): qty 2

## 2023-02-17 MED ORDER — POTASSIUM CHLORIDE 10 MEQ/50ML IV SOLN
10.0000 meq | INTRAVENOUS | Status: AC
  Administered 2023-02-17 (×3): 10 meq via INTRAVENOUS
  Filled 2023-02-17 (×3): qty 50

## 2023-02-17 MED ORDER — OSMOLITE 1.2 CAL PO LIQD: 1000.0000 mL | ORAL | Status: DC

## 2023-02-17 MED ORDER — SENNOSIDES-DOCUSATE SODIUM 8.6-50 MG PO TABS
1.0000 | ORAL_TABLET | Freq: Two times a day (BID) | ORAL | Status: DC
  Administered 2023-02-17: 1
  Filled 2023-02-17: qty 1

## 2023-02-17 NOTE — Progress Notes (Signed)
PHARMACY - TOTAL PARENTERAL NUTRITION CONSULT NOTE  Indication: Gallstone pancreatitis with GOO  Patient Measurements: Height: 5\' 8"  (172.7 cm) Weight: 67.6 kg (149 lb 0.5 oz) IBW/kg (Calculated) : 68.4 TPN AdjBW (KG): 66.7 Body mass index is 22.66 kg/m.  Assessment:  21 YOM presented on 8/9 with NGT displacement.  Patient/family reports admitted in May for 21 days for severe pancreatitis.  He was discharged on an oral diet and did not eat well.  He was readmitted in July, from which he was discharged on tube feeding.  He was on TF for 3-4 days before he vomited and the NGT was dislodged.  Patient/family reports losing ~40 lbs since May and his intake has been poor since.   RD and IR attempted post-pyloric tube placement and were unsuccessful.  Pharmacy consulted to manage TPN for pancreatitic pseudocyst and GOO.  Glucose / Insulin: DM2 on Basaglar 10/d PTA.  CBGs 161-226, trending up with TPN, also s/p dexameth 5 mg on 8/14. Received 17 units SSI yesterday, 11 units since TPN started, 10 units in TPN Electrolytes: K 3.5 s/p 4 runs + KPhos 15 mmol x1, Mag 2.1, others WNL B12 WNL Renal: SCr < 1, BUN WNL Hepatic: AST/ALT elevated (not d/t TPN), alk phos / tbili WNL, lipase 262, albumin 2.5 Intake / Output; MIVF: no NG output GI Imaging: none since TPN initiation GI Surgeries / Procedures: 8/14 upper EUS: LA grade C esophagitis without bleeding, pancreatic parenchymal abnormalities, cystic lesion in pancreatic head, feeding tube placed  Central access: PICC placed on 02/15/23 TPN start date: 02/15/23  Nutritional Goals:  RD Estimated Needs Total Energy Estimated Needs: 2100-2300 kcal/d Total Protein Estimated Needs: 105-125 g/d Total Fluid Estimated Needs: 2.1-2.3L/d  Current Nutrition:  TPN  Plan:  Increase TPN to goal rate of 85 mL/hr at 1800  Electrolytes in TPN: Na 141mEq/L, K 91mEq/L, Ca 46mEq/L, Mg 22mEq/L, Phos 68mmol/L. Cl:Ac 1:1 - all lytes increased with increasing TPN  rate Add standard MVI and trace elements to TPN Continue moderate SSI Q4H and add 15 units of regular insulin to TPN KCL x 3 runs Thiamine 100mg  IV daily x 5 days or until TPN is at goal rate Standard TPN labs on Mon/Thurs - labs in AM  [facility], PharmD, BCPS Clinical Pharmacist Clinical phone for 02/17/2023 is [phone] 02/17/2023 7:02 AM

## 2023-02-17 NOTE — Progress Notes (Signed)
PROGRESS NOTE    Jon Bray  ZOX:[id number] DOB: May 24, 1941 DOA: 02/11/2023 PCP: Danella Penton, MD    Chief Complaint  Patient presents with   NG Tube Displacement    Brief Narrative:  82 y.o. male  with medical history significant of esophageal stenosis s/p Cortrack NG-tube placed on 02/02/2023, insulin-dependent DM type II, essential hypertension, CAD, paroxysmal atrial fibrillation-not on anticoagulation, gallstone pancreatitis status post cholecystectomy May 2024, chronic anemia, chronic hyponatremia and moderate malnutrition presented to emergency department due to vomiting and displaced NG tube.  Patient had recent hospital admission 01/28/2023 to 02/08/2023 due to AKI, dehydration, acute pancreatitis. Patient underwent EGD 7/29 showed esophageal stenosis, hiatal hernia and ulcerated gastric fundus with congested mucosa. As patient did not tolerated oral diet well feeding tube Cortrack placed on 7/31 and to continue liquid diet as tolerated. Patient was discharged home with [facility] and supposed to be follow-up with GI on 02/22/2023.  CT scan 8/10 shows inflammatory changes with stranding of the fat surrounding had an aggressive process and peripancreatic fluid collection.  [facility] GI team has been consulted.  Patient underwent core track tube placement however unable to be advanced and tried under fluoroscopy which was unsuccessful. -Patient being followed by GI and patient for EUS today 02/16/2023 for either drainage of pancreatic pseudocyst versus pancreatic cyst gastrostomy.   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 -Nasoenteric tube was attempted however unable to get to postpyloric and as such GI removed tube at bedside 02/15/2023. -Continue TPN for nutrition. -Patient being followed by GI and patient s/p EUS, 02/16/2023, for either drainage of pancreatic pseudocyst via transgastric approach versus pancreatic cyst gastrostomy with GI. -Patient underwent EUS 02/16/2023, however due to anatomy and multicystic nature of pseudocyst and distance from gastric wall GI unable to perform cystogastrostomy.  -GI feels patient will require double-pigtail pseudocyst gastrostomy.  -Patient however still symptomatic with significant nausea and emesis with secretions.  -GI to discuss with Dr. Meridee Score and will also reach out to Dr. Edyth Gunnels at [facility] to see if able to perform pseudocyst gastrostomy at their facility. -Nasoenteric tube was placed in the postpyloric region and endoscopically confirmed on EUS on 02/16/2023, and patient to start trickle feeds per GI today.  -Continue IV antiemetics as needed.  -Per GI.   2.  Esophageal stenosis/acute on chronic nausea/vomiting -Secondary to problem #1. -Continue IV antiemetics of Zofran as needed, Phenergan as needed.  Will add Compazine as needed if Zofran and Phenergan not helping with nausea and emesis.  -See #1.  3.  Well-controlled diabetes mellitus type 2 -Hemoglobin A1c 5.8 (11/19/2022) -CBG 174 this morning.   -Patient currently NPO. -Continue to hold long-acting insulin while NPO. -Patient on TPN. -SSI.  4.  Chronic A-fib -Rate controlled.   -Not a anticoagulation candidate.   -Aspirin on hold.  -GI to advise when aspirin may be  resumed.   5.   History of CAD -Currently asymptomatic. -Aspirin on hold.  6.  Chronic normocytic anemia -H/H stable.  7.  8 mm nodule, right posterior bladder wall -Outpatient follow-up with urology.  8.  Leukocytosis -Likely reactive leukocytosis secondary to problem #1. -Afebrile.  -Patient with no urinary symptoms, no respiratory symptoms. -Patient status post EUS 02/16/2023.   -Continue to monitor off antibiotics.    9.  Hypokalemia -Potassium repletion per pharmacy as patient on TPN.     DVT prophylaxis: Heparin Code Status: Full Family Communication: Updated patient and best friend at bedside.  Disposition: TBD  Status is: Inpatient Remains inpatient appropriate because: Severity of illness   Consultants:  Gastroenterology: Dr. Leone Payor 02/12/2023 EUS: Per Dr. Meridee Score 02/16/2023  Procedures:  CT abdomen and pelvis 02/12/2023 Abdominal films 02/13/2023 NG tube placement under fluoroscopy attempt 02/14/2023.  Dr. Ova Freshwater, IR   Antimicrobials:  Anti-infectives (From admission, onward)    None         Subjective: Laying in bed.  Best friend at bedside.  NG tube in place.  Complains of upper abdominal epigastric pain.  Complains of nausea however no emesis.  Wondering whether he could have pain medications now.  Best friend at bedside.   Objective: Vitals:   02/16/23 1624 02/16/23 2011 02/17/23 0447 02/17/23 0740  BP: (!) 148/126 115/69 123/64 121/70  Pulse: 68 73 70 71  Resp: 18 18 18 18   Temp: 97.9 F (36.6 C) 98 F (36.7 C) (!) 97.4 F (36.3 C) 98.5 F (36.9 C)  TempSrc: Oral Oral Oral Oral  SpO2: 93% 96% 94% 97%  Weight:      Height:        Intake/Output Summary (Last 24 hours) at 02/17/2023 1252 Last data filed at 02/17/2023 0400 Gross per 24 hour  Intake 500 ml  Output 0 ml  Net 500 ml   Filed Weights   02/11/23 1939 02/15/23 0500  Weight: 66.7 kg 67.6 kg    Examination:  General exam: NAD.  NG tube in place. Respiratory system: CTAB anterior  lung fields.  No wheezes, fair air movement.  Speaking in full sentences.   Cardiovascular system: Regular rate rhythm no murmurs rubs or gallops.  No JVD.  No lower extremity edema.  Gastrointestinal system: Abdomen is soft, nondistended, tender to palpation epigastric region.  Positive bowel sounds.  No rebound.  No guarding.  Central nervous system: Alert and oriented. No focal neurological deficits. Extremities: Symmetric 5 x 5 power. Skin: No rashes, lesions or ulcers Psychiatry: Judgement and insight appear normal. Mood & affect appropriate.     Data Reviewed: I have personally reviewed following labs and imaging studies  CBC: Recent Labs  Lab 02/11/23 2151 02/12/23 0406 02/13/23 0044 02/14/23 0046 02/15/23 0136 02/16/23 0353 02/17/23 0354  WBC 15.7*   < > 10.7* 10.1 11.8* 13.6* 12.5*  NEUTROABS 10.6*  --   --   --   --   --   --   HGB 11.8*   < > 10.6* 10.3* 10.5* 10.2* 9.9*  HCT 35.8*   < > 33.1* 31.8* 31.7* 31.8* 30.3*  MCV 86.9   < > 84.4 85.5 84.8 84.8 85.1  PLT 377   < > 323 313 332 363 339   < > = values in this interval not displayed.    Basic Metabolic Panel: Recent Labs  Lab 02/13/23 0044 02/14/23 0046 02/15/23 0136 02/16/23 0353 02/17/23 0354  NA 134* 133* 133* 136 138  K  3.7 3.6 3.6 3.4* 3.5  CL 100 100 100 101 102  CO2 23 22 23 26 27   GLUCOSE 83 127* 117* 177* 161*  BUN 19 20 17 18 17   CREATININE 0.80 0.83 0.72 0.70 0.76  CALCIUM 8.4* 8.2* 8.1* 8.2* 8.1*  MG  --  2.0 1.8 2.2 2.1  PHOS  --  4.3 3.6 2.9 3.3    GFR: Estimated Creatinine Clearance: 66.9 mL/min (by C-G formula based on SCr of 0.76 mg/dL).  Liver Function Tests: Recent Labs  Lab 02/11/23 2151 02/12/23 0406 02/13/23 0044 02/16/23 0353 02/17/23 0354  AST 30 27 32 58* 51*  ALT 35 29 35 78* 81*  ALKPHOS 77 61 67 66 52  BILITOT 0.4 0.5 1.0 1.1 0.6  PROT 7.5 6.5 6.5 6.4* 6.0*  ALBUMIN 2.8* 2.4* 2.5* 2.5* 2.2*    CBG: Recent Labs  Lab 02/16/23 2103 02/17/23 0011  02/17/23 0450 02/17/23 0742 02/17/23 1227  GLUCAP 226* 177* 162* 174* 173*     No results found for this or any previous visit (from the past 240 hour(s)).       Radiology Studies: DG Abd 2 Views  Result Date: 02/16/2023 CLINICAL DATA:  Feeding tube placement EXAM: ABDOMEN - 2 VIEW limited for feeding tube COMPARISON:  X-ray 02/13/2023 FINDINGS: Limited x-ray for tube placement has a Dobbhoff tube in place with tip extending along the course of the distal third portion of the duodenal. There is contrast noted within the colon. Minimal small bowel gas. Air towards the rectum. No obstruction or obvious free air. There is some lucency however in the right upper quadrant which may be air in the biliary tree. Surgical clips in the right upper quadrant as well. Please correlate with history. IMPRESSION: Limited x-ray for tube placement has a Dobbhoff tube with tip overlying the third portion of the duodenal. Contrast in the colon. There is tubular air in the right upper quadrant overlying the hepatic shadow. Based on appearance this could be air in the biliary tree. Please correlate with the history. Electronically Signed   By: Karen Kays M.D.   On: 02/16/2023 17:11        Scheduled Meds:  bisacodyl  10 mg Rectal Once   Chlorhexidine Gluconate Cloth  6 each Topical Daily   heparin  5,000 Units Subcutaneous Q8H   insulin aspart  0-15 Units Subcutaneous Q4H   pantoprazole (PROTONIX) IV  40 mg Intravenous Q12H   sodium chloride flush  10-40 mL Intracatheter Q12H   sodium chloride flush  3 mL Intravenous Q12H   thiamine (VITAMIN B1) injection  100 mg Intravenous Daily   Continuous Infusions:  sodium chloride     promethazine (PHENERGAN) injection (IM or IVPB) 12.5 mg (02/16/23 0154)   TPN ADULT (ION) 55 mL/hr at 02/16/23 1727   TPN ADULT (ION)       LOS: 5 days    Time spent: 35 minutes    Ramiro Harvest, MD [facility]   To contact the attending provider between  7A-7P or the covering provider during after hours 7P-7A, please log into the web site [email or web address] and access using universal [place] password for that web site. If you do not have the password, please call the hospital operator.  02/17/2023, 12:52 PM

## 2023-02-17 NOTE — Progress Notes (Signed)
Cortrak Tube Team Note:  RD to bridle a small bore feeding tube placed by GI team in endo. Tube securely placed where left by GI team.    Kirby Crigler RD, LDN Clinical Dietitian See [facility] for contact information.

## 2023-02-17 NOTE — Progress Notes (Signed)
Nutrition Follow-up  DOCUMENTATION CODES:   Not applicable  INTERVENTION:  Continue TPN to meet 100% of estimated needs  Pharmacy to manage Start trickle tube feeds via NG-tube, Osmolite 1.5 at 20 mL/hr Once medically appropriate recommend advance by 10 mL every 4 hors to goal rate of 65 mL/h (1427mL/d) and Prosource TF 20 1x/d This provides 2240kcal, 110g protein, and of water  At discharge, feeds can be cycled: Osmolite 1.5 at 90 mL/hr x 16 hours from 1800 to 1000 (1440 mL or 6 cartons per day) Free water flush: 100 mL before and after connecting from pump + 4 additional 100 mL flushes throughout the day Regimen provides 2160 kcal, 90 gm protein, and free water (TF+flush 1697 mL total free water daily).   NUTRITION DIAGNOSIS:   Inadequate oral intake related to nausea, vomiting as evidenced by per patient/family report. - Being met via TPN  GOAL:   Patient will meet greater than or equal to 90% of their needs - Met via TPN   MONITOR:   PO intake, Supplement acceptance, Labs, I & O's  REASON FOR ASSESSMENT:   Consult Enteral/tube feeding initiation and management  ASSESSMENT:   Pt with hx of CAD, CKD, DM type 2, GERD, HLD, PAF, hx STEMI, and diverticulosis presented to ED with a dislodged NJ tube. Pt with recent admission related to severe pancreatitis and was discharged with NJ tube in place for enteral feeds.  8/09 - Admitted 8/13 - TPN initiated 8/14 - Op, upper EUS; NGT placed (tip third portion duodenum) 8/15 - Trickle Tube feeds started  Discussed starting trickle TF with MD's. Agreeable to try trickles since tube was advanced post-pyloric.   Pt laying in bed, reports feeling better after fluid was pulled from stomach. Has not had any other emesis since his procedure. Discussed starting trickle tube feeds with pt. Planning to continue with TPN until able to tolerate TF.   Medications reviewed and include: NovoLog SSI, Protonix, Thiamine  Labs  reviewed: Sodium 138, Potassium 3.5, BUN 17, Creatinine 0.76, Phosphorus 3.3, Magnesium 2.1 CBG: 162-226 x 24 hrs   Diet Order:   Diet Order             Diet NPO time specified  Diet effective now                   EDUCATION NEEDS:   Not appropriate for education at this time  Skin:  Skin Assessment: Reviewed RN Assessment  Last BM:  8/9  Height:  Ht Readings from Last 1 Encounters:  02/11/23 5\' 8"  (1.727 m)   Weight:  Wt Readings from Last 1 Encounters:  02/15/23 67.6 kg   Ideal Body Weight:  70 kg  BMI:  Body mass index is 22.66 kg/m.  Estimated Nutritional Needs:  Kcal:  2100-2300 kcal/d Protein:  105-125 g/d Fluid:  2.1-2.3L/d   Kirby Crigler RD, LDN Clinical Dietitian See [facility] for contact information.

## 2023-02-17 NOTE — Progress Notes (Signed)
Progress Note   Assessment    82 year old male with previous severe gallstone pancreatitis requiring ERCP and sphincterotomy with stone removal May 2024, pancreatitis complicated by fluid collections/pseudocyst with ongoing weight loss and now pseudocyst causing gastroduodenal outlet obstruction   Recommendations   1.  Pancreatic pseudocyst/gastroduodenal outlet obstruction --unable to perform cyst gastrostomy yesterday despite significant effort due to anatomy; multicystic nature of pseudocyst and distance from gastric wall.  Will likely require a double-pigtail pseudocyst gastrostomy.  He remains quite symptomatic despite n.p.o. status.  The nasoenteric tube has not even been used yet and he is only on TPN and remains n.p.o..  He still has significant nausea and vomiting despite secretions only.  Does not appear that he will be able to have meaningful recovery on TPN alone and I have discussed this further with Dr. Meridee Score.  I will reach out to Dr. Edyth Gunnels at [facility] to see if he could offer either transfer or day trip for pseudocyst gastrostomy. -- Continue n.p.o. -- Pain control and antiemetics -- Continue TPN -- Not unreasonable to try trickle feeds through nasoenteric tube which is endoscopically confirmed postpyloric -- His pain nausea and vomiting are from gastric distention from secretions -- Discussion with [facility] advanced gastroenterology to see if they can help with cyst gastrostomy/necrosectomy.  Ideally this would be transfer but may consider day trip depending on availability at [facility].   Chief Complaint   Patient reports 4 hours after EUS with aspiration of gastric fluid was the best he is felt in weeks This morning recurrent upper abdominal pain and nausea, he feels that he will vomit at any moment Feels that he needs pain medicine for the upper abdominal pain and additional IV Zofran TPN ongoing Nasoenteric tube in place but without any initiation of tube feedings   Vital  signs in last 24 hours: Temp:  [97.3 F (36.3 C)-98.5 F (36.9 C)] 98.5 F (36.9 C) (08/15 0740) Pulse Rate:  [68-79] 71 (08/15 0740) Resp:  [12-20] 18 (08/15 0740) BP: (109-148)/(62-126) 121/70 (08/15 0740) SpO2:  [90 %-97 %] 97 % (08/15 0740) Last BM Date : 02/11/23 Gen: awake, alert, NAD, tearful at times HEENT: anicteric, nasoenteric tube in right nare CV: RRR, no mrg Pulm: CTA b/l Abd: soft, tender in epigastrium without rebound or guarding, minimal palpable stomach bubble/distention,  +BS throughout Ext: no c/c/e Neuro: nonfocal  Intake/Output from previous day: 08/14 0701 - 08/15 0700 In: 500 [I.V.:500] Out: 0  Intake/Output this shift: No intake/output data recorded.  Lab Results: Recent Labs    02/15/23 0136 02/16/23 0353 02/17/23 0354  WBC 11.8* 13.6* 12.5*  HGB 10.5* 10.2* 9.9*  HCT 31.7* 31.8* 30.3*  PLT 332 363 339   BMET Recent Labs    02/15/23 0136 02/16/23 0353 02/17/23 0354  NA 133* 136 138  K 3.6 3.4* 3.5  CL 100 101 102  CO2 23 26 27   GLUCOSE 117* 177* 161*  BUN 17 18 17   CREATININE 0.72 0.70 0.76  CALCIUM 8.1* 8.2* 8.1*   LFT Recent Labs    02/17/23 0354  PROT 6.0*  ALBUMIN 2.2*  AST 51*  ALT 81*  ALKPHOS 52  BILITOT 0.6   Studies/Results: DG Abd 2 Views  Result Date: 02/16/2023 CLINICAL DATA:  Feeding tube placement EXAM: ABDOMEN - 2 VIEW limited for feeding tube COMPARISON:  X-ray 02/13/2023 FINDINGS: Limited x-ray for tube placement has a Dobbhoff tube in place with tip extending along the course of the distal third portion of the  duodenal. There is contrast noted within the colon. Minimal small bowel gas. Air towards the rectum. No obstruction or obvious free air. There is some lucency however in the right upper quadrant which may be air in the biliary tree. Surgical clips in the right upper quadrant as well. Please correlate with history. IMPRESSION: Limited x-ray for tube placement has a Dobbhoff tube with tip overlying the  third portion of the duodenal. Contrast in the colon. There is tubular air in the right upper quadrant overlying the hepatic shadow. Based on appearance this could be air in the biliary tree. Please correlate with the history. Electronically Signed   By: Karen Kays M.D.   On: 02/16/2023 17:11   [place] EKG SITE RITE  Result Date: 02/15/2023 If Site Rite image not attached, placement could not be confirmed due to current cardiac rhythm.     LOS: 5 days   Beverley Fiedler, MD 02/17/2023, 9:15 AM See Loretha Stapler, [facility], to contact our on call provider

## 2023-02-18 DIAGNOSIS — K859 Acute pancreatitis without necrosis or infection, unspecified: Secondary | ICD-10-CM | POA: Diagnosis not present

## 2023-02-18 DIAGNOSIS — Z4659 Encounter for fitting and adjustment of other gastrointestinal appliance and device: Secondary | ICD-10-CM | POA: Diagnosis not present

## 2023-02-18 DIAGNOSIS — K863 Pseudocyst of pancreas: Secondary | ICD-10-CM | POA: Diagnosis not present

## 2023-02-18 DIAGNOSIS — K311 Adult hypertrophic pyloric stenosis: Secondary | ICD-10-CM | POA: Diagnosis not present

## 2023-02-18 DIAGNOSIS — I10 Essential (primary) hypertension: Secondary | ICD-10-CM | POA: Diagnosis not present

## 2023-02-18 DIAGNOSIS — R112 Nausea with vomiting, unspecified: Secondary | ICD-10-CM | POA: Diagnosis not present

## 2023-02-18 LAB — CBC
HCT: 31.3 % — ABNORMAL LOW (ref 39.0–52.0)
Hemoglobin: 9.7 g/dL — ABNORMAL LOW (ref 13.0–17.0)
MCH: 28.1 pg (ref 26.0–34.0)
MCHC: 31 g/dL (ref 30.0–36.0)
MCV: 90.7 fL (ref 80.0–100.0)
Platelets: 317 10*3/uL (ref 150–400)
RBC: 3.45 MIL/uL — ABNORMAL LOW (ref 4.22–5.81)
RDW: 15.5 % (ref 11.5–15.5)
WBC: 11.4 10*3/uL — ABNORMAL HIGH (ref 4.0–10.5)
nRBC: 0 % (ref 0.0–0.2)

## 2023-02-18 LAB — COMPREHENSIVE METABOLIC PANEL
ALT: 69 U/L — ABNORMAL HIGH (ref 0–44)
AST: 34 U/L (ref 15–41)
Albumin: 2.2 g/dL — ABNORMAL LOW (ref 3.5–5.0)
Alkaline Phosphatase: 56 U/L (ref 38–126)
Anion gap: 10 (ref 5–15)
BUN: 21 mg/dL (ref 8–23)
CO2: 25 mmol/L (ref 22–32)
Calcium: 8 mg/dL — ABNORMAL LOW (ref 8.9–10.3)
Chloride: 101 mmol/L (ref 98–111)
Creatinine, Ser: 0.66 mg/dL (ref 0.61–1.24)
GFR, Estimated: >60 mL/min (ref >60)
Glucose, Bld: 181 mg/dL — ABNORMAL HIGH (ref 70–99)
Potassium: 3.9 mmol/L (ref 3.5–5.1)
Sodium: 136 mmol/L (ref 135–145)
Total Bilirubin: 0.6 mg/dL (ref 0.3–1.2)
Total Protein: 5.7 g/dL — ABNORMAL LOW (ref 6.5–8.1)

## 2023-02-18 LAB — MAGNESIUM: Magnesium: 1.9 mg/dL (ref 1.7–2.4)

## 2023-02-18 LAB — GLUCOSE, CAPILLARY
Glucose-Capillary: 194 mg/dL — ABNORMAL HIGH (ref 70–99)
Glucose-Capillary: 221 mg/dL — ABNORMAL HIGH (ref 70–99)
Glucose-Capillary: 136 mg/dL — ABNORMAL HIGH (ref 70–99)
Glucose-Capillary: 165 mg/dL — ABNORMAL HIGH (ref 70–99)
Glucose-Capillary: 165 mg/dL — ABNORMAL HIGH (ref 70–99)

## 2023-02-18 LAB — PHOSPHORUS: Phosphorus: 3.3 mg/dL (ref 2.5–4.6)

## 2023-02-18 MED ORDER — TRAVASOL 10 % IV SOLN
INTRAVENOUS | Status: AC
  Filled 2023-02-18: qty 1101.6

## 2023-02-18 MED ORDER — SENNOSIDES 8.8 MG/5ML PO SYRP
5.0000 mL | ORAL_SOLUTION | Freq: Two times a day (BID) | ORAL | Status: DC
  Administered 2023-02-18 – 2023-02-20 (×3): 5 mL
  Filled 2023-02-18 (×8): qty 5

## 2023-02-18 MED ORDER — INSULIN ASPART 100 UNIT/ML IJ SOLN
0.0000 [IU] | Freq: Four times a day (QID) | INTRAMUSCULAR | Status: DC
  Administered 2023-02-18: 2 [IU] via SUBCUTANEOUS
  Administered 2023-02-18 – 2023-02-19 (×4): 3 [IU] via SUBCUTANEOUS
  Administered 2023-02-19: 5 [IU] via SUBCUTANEOUS
  Administered 2023-02-20: 8 [IU] via SUBCUTANEOUS
  Administered 2023-02-20: 5 [IU] via SUBCUTANEOUS

## 2023-02-18 MED ORDER — DOCUSATE SODIUM 50 MG/5ML PO LIQD
50.0000 mg | Freq: Two times a day (BID) | ORAL | Status: DC
  Administered 2023-02-18 – 2023-02-20 (×3): 50 mg
  Filled 2023-02-18 (×6): qty 10

## 2023-02-18 NOTE — Progress Notes (Signed)
PROGRESS NOTE    Jon Bray  BJY:[id number] DOB: 1941-02-19 DOA: 02/11/2023 PCP: Danella Penton, MD    Chief Complaint  Patient presents with   NG Tube Displacement    Brief Narrative:  82 y.o. male  with medical history significant of esophageal stenosis s/p Cortrack NG-tube placed on 02/02/2023, insulin-dependent DM type II, essential hypertension, CAD, paroxysmal atrial fibrillation-not on anticoagulation, gallstone pancreatitis status post cholecystectomy May 2024, chronic anemia, chronic hyponatremia and moderate malnutrition presented to emergency department due to vomiting and displaced NG tube.  Patient had recent hospital admission 01/28/2023 to 02/08/2023 due to AKI, dehydration, acute pancreatitis. Patient underwent EGD 7/29 showed esophageal stenosis, hiatal hernia and ulcerated gastric fundus with congested mucosa. As patient did not tolerated oral diet well feeding tube Cortrack placed on 7/31 and to continue liquid diet as tolerated. Patient was discharged home with [facility] and supposed to be follow-up with GI on 02/22/2023.  CT scan 8/10 shows inflammatory changes with stranding of the fat surrounding had an aggressive process and peripancreatic fluid collection.  [facility] team has been consulted.  Patient underwent core track tube placement however unable to be advanced and tried under fluoroscopy which was unsuccessful. -Patient being followed by GI and patient for EUS today 02/16/2023 for either drainage of pancreatic pseudocyst versus pancreatic cyst gastrostomy.   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 -Nasoenteric tube was attempted however unable to get to postpyloric and as such GI removed tube at bedside 02/15/2023. -Continue TPN for nutrition. -Patient being followed by GI and patient s/p EUS, 02/16/2023, for either drainage of pancreatic pseudocyst via transgastric approach versus pancreatic cyst gastrostomy with GI. -Patient underwent EUS 02/16/2023, however due to anatomy and multicystic nature of pseudocyst and distance from gastric wall GI unable to perform cystogastrostomy.  -GI feels patient will require double-pigtail pseudocyst gastrostomy.  -Patient however still symptomatic with significant nausea and emesis with secretions.  -GI to discuss with Dr. Meridee Score and will also reach out to Dr. Edyth Gunnels at [facility] to see if able to perform pseudocyst gastrostomy at their facility. -Nasoenteric tube was placed in the postpyloric region and endoscopically confirmed on EUS on 02/16/2023, and patient started on trickle feeds per GI on 02/17/2023.   -GI, Dr. Rhea Belton has discussed with Dr. Edyth Gunnels of [facility] and is noted will be able to accommodate cystogastrostomy versus endoscopic gastrojejunostomy on Monday which will be a day trip at [facility].  -Per GI patient will need to be n.p.o. and tube feeds stopped at midnight on Sunday going into Monday prior to procedure.   -Continue antiemetics, pain control.   -Per GI.   2.  Esophageal stenosis/acute on chronic nausea/vomiting -Secondary to problem #1. -Continue IV antiemetics of Zofran as needed, Phenergan as needed, IV Compazine as needed if Zofran and Phenergan not helping with nausea and emesis. -See #1.  3.  Well-controlled  diabetes mellitus type 2 -Hemoglobin A1c 5.8 (11/19/2022) -CBG of 194 this morning.   -Patient currently NPO. -Continue to hold long-acting insulin while NPO. -Patient on TPN and also noted to have started on trickle feeds. -SSI.  4.  Chronic A-fib -Currently rate controlled.   -Patient on anticoagulation candidate.   -Aspirin on hold.   -GI to advise when aspirin may be resumed.  5.  History of CAD -Currently asymptomatic. -Aspirin on hold.  6.  Chronic normocytic anemia -Hemoglobin stable at 9.7.    7.  8 mm nodule, right posterior bladder wall -Outpatient follow-up with urology.  8.  Leukocytosis -Likely reactive leukocytosis secondary to problem #1. -Afebrile.  -Patient with no urinary symptoms, no respiratory symptoms. -Patient status post EUS 02/16/2023.   -Continue to monitor off antibiotics.    9.  Hypokalemia -Repleted.  Potassium at 3.9 this morning.   -Patient on TPN per pharmacy.     DVT prophylaxis: Heparin Code Status: Full Family Communication: Updated patient and wife at bedside.  Disposition: TBD  Status is: Inpatient Remains inpatient appropriate because: Severity of illness   Consultants:  Gastroenterology: Dr. Leone Payor 02/12/2023 EUS: Per Dr. Meridee Score 02/16/2023  Procedures:  CT abdomen and pelvis 02/12/2023 Abdominal films 02/13/2023 NG tube placement under fluoroscopy attempt 02/14/2023.  Dr. Ova Freshwater, IR   Antimicrobials:  Anti-infectives (From admission, onward)    None         Subjective: Lying in bed.  Wife at bedside.  Denies any chest pain or shortness of breath.  No emesis.  Still with some right upper quadrant and epigastric abdominal pain.  Stated has not had any pain medications today.    Objective: Vitals:   02/17/23 1714 02/17/23 2011 02/18/23 0442 02/18/23 0726  BP: (!) 108/58 115/62 129/72 123/70  Pulse: 72 79 74 80  Resp: 18 18 18 16   Temp: 98.5 F (36.9 C) (!) 97.5 F (36.4 C)  97.9 F (36.6 C)  TempSrc:  Oral Oral  Oral  SpO2: 97% 99% 95% 97%  Weight:      Height:        Intake/Output Summary (Last 24 hours) at 02/18/2023 1325 Last data filed at 02/18/2023 1131 Gross per 24 hour  Intake 300.33 ml  Output --  Net 300.33 ml   Filed Weights   02/11/23 1939 02/15/23 0500  Weight: 66.7 kg 67.6 kg    Examination:  General exam: NAD.  NG tube in place. Respiratory system: CTAB anterior lung fields.  No wheezes, no crackles, no rhonchi.  Fair air movement.  Speaking in full sentences.  Cardiovascular system: RRR no murmurs rubs or gallops.  No JVD.  No pitting lower extremity edema.  Gastrointestinal system: Abdomen is soft, nondistended, some tenderness to palpation right upper quadrant and epigastrium.  Positive bowel sounds.  No rebound.  No guarding.  Central nervous system: Alert and oriented. No focal neurological deficits. Extremities: Symmetric 5 x 5 power. Skin: No rashes, lesions or ulcers Psychiatry: Judgement and insight appear normal. Mood & affect appropriate.     Data Reviewed: I have personally reviewed following labs and imaging studies  CBC: Recent Labs  Lab 02/11/23 2151 02/12/23 0406 02/14/23 0046 02/15/23 0136 02/16/23 0353 02/17/23 0354 02/18/23 0354  WBC 15.7*   < > 10.1 11.8* 13.6* 12.5* 11.4*  NEUTROABS 10.6*  --   --   --   --   --   --   HGB 11.8*   < > 10.3* 10.5*  10.2* 9.9* 9.7*  HCT 35.8*   < > 31.8* 31.7* 31.8* 30.3* 31.3*  MCV 86.9   < > 85.5 84.8 84.8 85.1 90.7  PLT 377   < > 313 332 363 339 317   < > = values in this interval not displayed.    Basic Metabolic Panel: Recent Labs  Lab 02/14/23 0046 02/15/23 0136 02/16/23 0353 02/17/23 0354 02/18/23 0959  NA 133* 133* 136 138 136  K 3.6 3.6 3.4* 3.5 3.9  CL 100 100 101 102 101  CO2 22 23 26 27 25   GLUCOSE 127* 117* 177* 161* 181*  BUN 20 17 18 17 21   CREATININE 0.83 0.72 0.70 0.76 0.66  CALCIUM 8.2* 8.1* 8.2* 8.1* 8.0*  MG 2.0 1.8 2.2 2.1 1.9  PHOS 4.3 3.6 2.9 3.3 3.3     GFR: Estimated Creatinine Clearance: 66.9 mL/min (by C-G formula based on SCr of 0.66 mg/dL).  Liver Function Tests: Recent Labs  Lab 02/12/23 0406 02/13/23 0044 02/16/23 0353 02/17/23 0354 02/18/23 0959  AST 27 32 58* 51* 34  ALT 29 35 78* 81* 69*  ALKPHOS 61 67 66 52 56  BILITOT 0.5 1.0 1.1 0.6 0.6  PROT 6.5 6.5 6.4* 6.0* 5.7*  ALBUMIN 2.4* 2.5* 2.5* 2.2* 2.2*    CBG: Recent Labs  Lab 02/17/23 2123 02/17/23 2352 02/18/23 0444 02/18/23 0727 02/18/23 1141  GLUCAP 203* 171* 194* 221* 136*     No results found for this or any previous visit (from the past 240 hour(s)).       Radiology Studies: DG Abd 2 Views  Result Date: 02/16/2023 CLINICAL DATA:  Feeding tube placement EXAM: ABDOMEN - 2 VIEW limited for feeding tube COMPARISON:  X-ray 02/13/2023 FINDINGS: Limited x-ray for tube placement has a Dobbhoff tube in place with tip extending along the course of the distal third portion of the duodenal. There is contrast noted within the colon. Minimal small bowel gas. Air towards the rectum. No obstruction or obvious free air. There is some lucency however in the right upper quadrant which may be air in the biliary tree. Surgical clips in the right upper quadrant as well. Please correlate with history. IMPRESSION: Limited x-ray for tube placement has a Dobbhoff tube with tip overlying the third portion of the duodenal. Contrast in the colon. There is tubular air in the right upper quadrant overlying the hepatic shadow. Based on appearance this could be air in the biliary tree. Please correlate with the history. Electronically Signed   By: Karen Kays M.D.   On: 02/16/2023 17:11        Scheduled Meds:  bisacodyl  10 mg Rectal Once   bisacodyl  10 mg Rectal Once   Chlorhexidine Gluconate Cloth  6 each Topical Daily   docusate  50 mg Per Tube BID   heparin  5,000 Units Subcutaneous Q8H   insulin aspart  0-15 Units Subcutaneous Q6H   pantoprazole (PROTONIX) IV  40 mg  Intravenous Q12H   sennosides  5 mL Per Tube BID   sodium chloride flush  10-40 mL Intracatheter Q12H   sodium chloride flush  3 mL Intravenous Q12H   Continuous Infusions:  sodium chloride     feeding supplement (OSMOLITE 1.5 CAL) 20 mL/hr at 02/18/23 0805   promethazine (PHENERGAN) injection (IM or IVPB) 12.5 mg (02/16/23 0154)   TPN ADULT (ION) 85 mL/hr at 02/17/23 1737   TPN ADULT (ION)       LOS: 6 days  Time spent: 35 minutes    Ramiro Harvest, MD [facility]   To contact the attending provider between 7A-7P or the covering provider during after hours 7P-7A, please log into the web site [email or web address] and access using universal [place] password for that web site. If you do not have the password, please call the hospital operator.  02/18/2023, 1:25 PM

## 2023-02-18 NOTE — Progress Notes (Signed)
PHARMACY - TOTAL PARENTERAL NUTRITION CONSULT NOTE  Indication: Gallstone pancreatitis with GOO  Patient Measurements: Height: 5\' 8"  (172.7 cm) Weight: 67.6 kg (149 lb 0.5 oz) IBW/kg (Calculated) : 68.4 TPN AdjBW (KG): 66.7 Body mass index is 22.66 kg/m.  Assessment:  15 YOM presented on 8/9 with NGT displacement.  Patient/family reports admitted in May for 21 days for severe pancreatitis.  He was discharged on an oral diet and did not eat well.  He was readmitted in July, from which he was discharged on tube feeding.  He was on TF for 3-4 days before he vomited and the NGT was dislodged.  Patient/family reports losing ~40 lbs since May and his intake has been poor since. RD and IR attempted post-pyloric tube placement and were unsuccessful.  Pharmacy consulted to manage TPN for pancreatitic pseudocyst and GOO.  Glucose / Insulin: DM2 on Basaglar 10/d PTA, A1C 5.8.  CBGs 171-203, used 20 units SSI/ 24hr + 15 units in TPN Electrolytes: Na 136, CoCa 9.4, Mg 1.9, others WNL B12 WNL Renal: SCr  0.66, BUN WNL Hepatic: AST/ALT elevated (not d/t TPN), alk phos / tbili WNL, lipase 262, albumin 2.5 Intake / Output; MIVF: on TF; no NG charted, UOP x2 charted GI Imaging:  8/10 CT: increase in pancreatic pseudocyst size, severe sigmoid diverticulosis, no bowel obstruction  8/11 KUB: nonobstructive bowel gas pattern, mild colonic stool 8/14 KUB: contrast in colon, minimal SB gas  GI Surgeries / Procedures: 8/12 Naso G Tube placement - unable to advance into duodenum d/t anatomy  8/14 upper EUS: LA grade C esophagitis without bleeding, pancreatic parenchymal abnormalities, cystic lesion in pancreatic head, feeding tube placed  Central access: PICC placed on 02/15/23 TPN start date: 02/15/23  Nutritional Goals:  RD Estimated Needs Total Energy Estimated Needs: 2100-2300 kcal/d Total Protein Estimated Needs: 105-125 g/d Total Fluid Estimated Needs: 2.1-2.3L/d  Current Nutrition:  TPN 8/15 TF at  20 ml/hr- some nausea per RN   8/16 TF at 20 ml/hr- tolerating without nausea per RN    Plan:  Continue TPN at goal rate of 85 mL/hr at 1800, provides 110g AA, 2137 kcal, meeting 100% estimated needs  Electrolytes in TPN: increase Na 125 mEq/L, decrease K 45 mEq/L, Ca 5 mEq/L, increase Mg 8 mEq/L, Phos 15 mmol/L. Cl:Ac 1:1   Add standard MVI and trace elements to TPN Decrease moderate SSI and BG checks to Q6H  Increase to 22 units of regular insulin to TPN  Stop thiamine  Standard TPN labs on Mon/Thurs and PRN F/u TF advancement and wean TPN as able   Alphia Moh, PharmD, BCPS, BCCP Clinical Pharmacist  Please check AMION for all [facility] Pharmacy phone numbers After 10:00 PM, call Main Pharmacy [phone]

## 2023-02-18 NOTE — Progress Notes (Signed)
Progress Note   Assessment    82 year old male with previous severe gallstone pancreatitis requiring ERCP and sphincterotomy with stone removal May 2024, pancreatitis complicated by fluid collections/pseudocyst with ongoing weight loss and now pseudocyst causing gastroduodenal outlet obstruction     Recommendations   1.  Pancreatic pseudocyst/gastroduodenal outlet obstruction with nausea vomiting --tolerating trickle tube feeds, increase to goal as able.  Continue TPN until adequate nutrition either p.o. or via tube.  We have been in discussion with [facility] biliary endoscopy, Dr. Edyth Gunnels who will be able to accommodate cyst gastrostomy versus endoscopic gastrojejunostomy on Monday.  This will be a day trip.  I spoke to the transfer center at [facility] and the hospitalist told me they were unable to accept him in hospital transfer at this time due to being at maximum wait list.  He is not on the wait list.  The patient and his family were confused about this given the multiple people that have communicated with them regarding this transfer and upcoming procedure. -- Continue trickle feeds and TPN -- Will need to be n.p.o.; tube feeds need to stop at midnight on Sunday going into Monday -- He will need to be at [facility] at [address], [place], [place] [address] at 8 AM on Monday morning.  He will go to the GI procedure unit in the basement of that building. -- His RN on leaving [facility] needs to call [phone] to give a basic report to Endo --He will then very likely be ball back to [facility] for ongoing treatment after his procedure -- Continue pain control and antiemetics  55 minutes total spent today including patient facing time, coordination of care, reviewing medical history/procedures/pertinent radiology studies, and documentation of the encounter.    Chief Complaint   Patient stable, no overnight events Wife at bedside Tolerating trickle feeds through his nasoenteric  tube No bowel movements No increasing abdominal pain  Vital signs in last 24 hours: Temp:  [97.5 F (36.4 C)-98.5 F (36.9 C)] 97.9 F (36.6 C) (08/16 0726) Pulse Rate:  [72-80] 80 (08/16 0726) Resp:  [16-18] 16 (08/16 0726) BP: (108-129)/(58-72) 123/70 (08/16 0726) SpO2:  [95 %-99 %] 97 % (08/16 0726) Last BM Date : 02/11/23 Gen: awake, alert, NAD HEENT: anicteric  Abd: soft, NT/ND, +BS throughout Ext: no c/c/e Neuro: nonfocal  Intake/Output from previous day: No intake/output data recorded. Intake/Output this shift: Total I/O In: 300.3 [I.V.:13; NG/GT:287.3] Out: -   Lab Results: Recent Labs    02/16/23 0353 02/17/23 0354 02/18/23 0354  WBC 13.6* 12.5* 11.4*  HGB 10.2* 9.9* 9.7*  HCT 31.8* 30.3* 31.3*  PLT 363 339 317   BMET Recent Labs    02/16/23 0353 02/17/23 0354 02/18/23 0959  NA 136 138 136  K 3.4* 3.5 3.9  CL 101 102 101  CO2 26 27 25   GLUCOSE 177* 161* 181*  BUN 18 17 21   CREATININE 0.70 0.76 0.66  CALCIUM 8.2* 8.1* 8.0*   LFT Recent Labs    02/18/23 0959  PROT 5.7*  ALBUMIN 2.2*  AST 34  ALT 69*  ALKPHOS 56  BILITOT 0.6   Studies/Results: DG Abd 2 Views  Result Date: 02/16/2023 CLINICAL DATA:  Feeding tube placement EXAM: ABDOMEN - 2 VIEW limited for feeding tube COMPARISON:  X-ray 02/13/2023 FINDINGS: Limited x-ray for tube placement has a Dobbhoff tube in place with tip extending along the course of the distal third portion of the duodenal. There is contrast  noted within the colon. Minimal small bowel gas. Air towards the rectum. No obstruction or obvious free air. There is some lucency however in the right upper quadrant which may be air in the biliary tree. Surgical clips in the right upper quadrant as well. Please correlate with history. IMPRESSION: Limited x-ray for tube placement has a Dobbhoff tube with tip overlying the third portion of the duodenal. Contrast in the colon. There is tubular air in the right upper quadrant overlying  the hepatic shadow. Based on appearance this could be air in the biliary tree. Please correlate with the history. Electronically Signed   By: Karen Kays M.D.   On: 02/16/2023 17:11      LOS: 6 days   Beverley Fiedler, MD 02/18/2023, 2:48 PM See Loretha Stapler, [facility], to contact our on call provider

## 2023-02-18 NOTE — Progress Notes (Signed)
Secured chat received from GI PA: we need to secure [facility] transport to [facility] for Monday am arrival there at 8 am for GI procedures- how do we make that happen ? does nursing arrange?-- this will be a Day trip with plans for [facility] to stay with pt and transport back here later in the day. Nurse called [facility]  and was told they cannot transport this pt at 0800 d/t change of shift. They can come get the pt at 0500. Otherwise you will have to reach out to [facility] for transport.  NCM called  [facility] @ [phone] to arrange transportation for procedure. [facility] explained to NCM to called the night before to arrange, however, they can not guaranteed they will be able to assist with transportation. Too early to arrange with them today. Gae Gallop RN,BSN,CM [phone]

## 2023-02-18 NOTE — Plan of Care (Signed)
  Problem: Health Behavior/Discharge Planning: Goal: Ability to manage health-related needs will improve Outcome: Progressing   

## 2023-02-18 NOTE — Plan of Care (Signed)
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  Problem: Clinical Measurements: Goal: Ability to maintain clinical measurements within normal limits will improve Outcome: Progressing Pt's VS WNL.    Problem: Clinical Measurements: Goal: Will remain free from infection Outcome: Progressing S/Sx of infection monitored and assessed q8 hours.  Pt has remained afebrile thus far.       Problem: Clinical Measurements: Goal: Respiratory complications will improve Outcome: Progressing Respiratory status monitored and assessed q8 hours.  Pt is on room air with PO2 saturations at 97-98% and respirations of 16-18 breaths per minute.  He has not  endorsed c/o SOB and DOE.      Problem: Activity: Goal: Risk for activity intolerance will decrease Outcome: Progressing Pt is a moderate assist of all his ADLs. He needs the assistance of RN staff to get OOB.   Problem: Nutrition: Goal: Adequate nutrition will be maintained Outcome: Progressing Pt is on a NPO per MD's orders.  He is receiving TPN via a left upper arm double lumen PICC and TF of Osmolite 1.5 continuously at 20 ml/hr per MD's orders.  Pt has been able to tolerate TF and TPN w/o c/o abdominal distention/ pain or n/v.     Problem: Elimination: Goal: Will not experience complications related to bowel motility Outcome: Progressing Pt LBM was in 02/18/2023.  He has endorse c/o constipation.    Problem: Elimination: Goal: Will not experience complications related to urinary retention Outcome: Progressing Pt has denied c/o dysuria or abdominal distention/ pain.   Problem: Pain  Management: Goal: General experience of comfort will improve Outcome: Progressing Pt has denied c/o pain thus far.     Problem: Safety: Goal: Ability to remain free from injury will improve Outcome: Progressing Pt has remained free from falls thus far.  Instructed pt to utilize RN call light for assistance.  Hourly rounds performed.  Bed alarm implemented to keep pt safe from falls.  Settings activated to third most sensitive mode.  Bed in lowest position, locked with two upper side rails engaged.  Belongings and call light within reach.    Problem: Skin Integrity: Goal: Risk for impaired skin integrity will decrease Outcome: Progressing Skin integrity monitored and assessed q-shift. Pt is on q2 hourly turns to prevent further skin impairment.  Tubes and drains assessed for device related pressure sores.  Pt is continent of both bowel and bladder.

## 2023-02-18 NOTE — Consult Note (Signed)
   [facility] CM Inpatient Consult   02/18/2023  Jon Bray 10/03/1940 [id number]  Follow up: Review, patient with HTA  Received updated information from inpatient [facility] RN regarding needs for a special procedure at [facility].  Following for progress and community needs.  Charlesetta Shanks, RN BSN CCM [facility]  [phone] business mobile phone Toll free office [phone]  Fax number: [phone] [name]

## 2023-02-19 DIAGNOSIS — K311 Adult hypertrophic pyloric stenosis: Secondary | ICD-10-CM | POA: Diagnosis not present

## 2023-02-19 DIAGNOSIS — Z4659 Encounter for fitting and adjustment of other gastrointestinal appliance and device: Secondary | ICD-10-CM | POA: Diagnosis not present

## 2023-02-19 DIAGNOSIS — I10 Essential (primary) hypertension: Secondary | ICD-10-CM | POA: Diagnosis not present

## 2023-02-19 DIAGNOSIS — K863 Pseudocyst of pancreas: Secondary | ICD-10-CM | POA: Diagnosis not present

## 2023-02-19 DIAGNOSIS — R112 Nausea with vomiting, unspecified: Secondary | ICD-10-CM | POA: Diagnosis not present

## 2023-02-19 DIAGNOSIS — K859 Acute pancreatitis without necrosis or infection, unspecified: Secondary | ICD-10-CM | POA: Diagnosis not present

## 2023-02-19 LAB — CBC
HCT: 30 % — ABNORMAL LOW (ref 39.0–52.0)
Hemoglobin: 9.7 g/dL — ABNORMAL LOW (ref 13.0–17.0)
MCH: 28.4 pg (ref 26.0–34.0)
MCHC: 32.3 g/dL (ref 30.0–36.0)
MCV: 88 fL (ref 80.0–100.0)
Platelets: 302 10*3/uL (ref 150–400)
RBC: 3.41 MIL/uL — ABNORMAL LOW (ref 4.22–5.81)
RDW: 15.9 % — ABNORMAL HIGH (ref 11.5–15.5)
WBC: 10 10*3/uL (ref 4.0–10.5)
nRBC: 0 % (ref 0.0–0.2)

## 2023-02-19 LAB — BASIC METABOLIC PANEL
Anion gap: 9 (ref 5–15)
BUN: 22 mg/dL (ref 8–23)
CO2: 25 mmol/L (ref 22–32)
Calcium: 8.1 mg/dL — ABNORMAL LOW (ref 8.9–10.3)
Chloride: 100 mmol/L (ref 98–111)
Creatinine, Ser: 0.74 mg/dL (ref 0.61–1.24)
GFR, Estimated: >60 mL/min (ref >60)
Glucose, Bld: 234 mg/dL — ABNORMAL HIGH (ref 70–99)
Potassium: 4.1 mmol/L (ref 3.5–5.1)
Sodium: 134 mmol/L — ABNORMAL LOW (ref 135–145)

## 2023-02-19 LAB — MAGNESIUM: Magnesium: 1.9 mg/dL (ref 1.7–2.4)

## 2023-02-19 LAB — GLUCOSE, CAPILLARY
Glucose-Capillary: 192 mg/dL — ABNORMAL HIGH (ref 70–99)
Glucose-Capillary: 216 mg/dL — ABNORMAL HIGH (ref 70–99)
Glucose-Capillary: 166 mg/dL — ABNORMAL HIGH (ref 70–99)
Glucose-Capillary: 176 mg/dL — ABNORMAL HIGH (ref 70–99)

## 2023-02-19 LAB — PHOSPHORUS: Phosphorus: 3.4 mg/dL (ref 2.5–4.6)

## 2023-02-19 MED ORDER — OSMOLITE 1.5 CAL PO LIQD
1000.0000 mL | ORAL | Status: AC
  Administered 2023-02-19 – 2023-02-20 (×6): 1000 mL
  Filled 2023-02-19 (×4): qty 1000

## 2023-02-19 MED ORDER — TRAVASOL 10 % IV SOLN
INTRAVENOUS | Status: AC
  Filled 2023-02-19: qty 1101.6

## 2023-02-19 NOTE — Progress Notes (Signed)
PHARMACY - TOTAL PARENTERAL NUTRITION CONSULT NOTE  Indication: Gallstone pancreatitis with GOO  Patient Measurements: Height: 5\' 8"  (172.7 cm) Weight: 67.6 kg (149 lb 0.5 oz) IBW/kg (Calculated) : 68.4 TPN AdjBW (KG): 66.7 Body mass index is 22.66 kg/m.  Assessment:  32 YOM presented on 8/9 with NGT displacement.  Patient/family reports admitted in May for 21 days for severe pancreatitis.  He was discharged on an oral diet and did not eat well.  He was readmitted in July, from which he was discharged on tube feeding.  He was on TF for 3-4 days before he vomited and the NGT was dislodged.  Patient/family reports losing ~40 lbs since May and his intake has been poor since. RD and IR attempted post-pyloric tube placement and were unsuccessful.  Pharmacy consulted to manage TPN for pancreatitic pseudocyst and GOO.  Planning transfer to [facility] on 8/19 for cystogastrostomy versus endoscopic gastrojejunostomy. Will stop TF at midnight prior to procedure and return to [facility] after procedure.  Glucose / Insulin: DM2 on Basaglar 10/d PTA, A1C 5.8.  CBGs 136-221, used 16 units SSI/ 24hr + 22 units in TPN Electrolytes: Na 134, CoCa 9.5, Mg 1.9, others WNL  Renal: SCr  0.74, BUN WNL Hepatic: ALT mildly elevated (not d/t TPN), alk phos/AST/tbili WNL, albumin 2.2 Intake / Output; MIVF: on TF@ 64ml/hr; UOP x3 charted, LBM 8/16 GI Imaging:  8/10 CT: increase in pancreatic pseudocyst size, severe sigmoid diverticulosis, no bowel obstruction  8/11 KUB: nonobstructive bowel gas pattern, mild colonic stool 8/14 KUB: contrast in colon, minimal SB gas  GI Surgeries / Procedures: 8/12 Naso G Tube placement - unable to advance into duodenum d/t anatomy  8/14 upper EUS: LA grade C esophagitis without bleeding, pancreatic parenchymal abnormalities, cystic lesion in pancreatic head, feeding tube placed  Central access: PICC placed on 02/15/23 TPN start date: 02/15/23  Nutritional Goals:  RD Estimated Needs Total  Energy Estimated Needs: 2100-2300 kcal/d Total Protein Estimated Needs: 105-125 g/d Total Fluid Estimated Needs: 2.1-2.3L/d  Current Nutrition:  TPN 8/15 TF at 20 ml/hr- some nausea per RN   8/16 TF at 20 ml/hr- tolerating without nausea per RN   8/17 TF at 20 ml/hr- advance per GI/RD   Plan:  Continue TPN at goal rate of 85 mL/hr at 1800, provides 110g AA, 2113 kcal, meeting 100% estimated needs  Electrolytes in TPN: increase Na 150 mEq/L, decrease K 40 mEq/L, Ca 5 mEq/L, increase Mg 12 mEq/L, Phos 15 mmol/L. Cl:Ac 1:1   Add standard MVI and trace elements to TPN Continue moderate SSI and BG checks to Q6H  Increase to 25 units of regular insulin to TPN  Standard TPN labs on Mon/Thurs and PRN F/u TF advancement after procedure 8/19 and wean TPN as able   Alphia Moh, PharmD, BCPS, [facility] Clinical Pharmacist  Please check AMION for all [facility] Pharmacy phone numbers After 10:00 PM, call Main Pharmacy [phone]

## 2023-02-19 NOTE — Progress Notes (Signed)
    Progress Note   Assessment    82 year old male with previous severe gallstone pancreatitis requiring ERCP and sphincterotomy with stone removal May 2024, pancreatitis complicated by fluid collections/pseudocyst with ongoing weight loss and now pseudocyst causing gastroduodenal outlet obstruction    Recommendations   1.  Pancreatic pseudocyst/gastroduodenal outlet obstruction with nausea/vomiting --he is tolerating trickle feeds at 20 mL/h, we will increase to a goal of 65 mL/h per dietary.  TPN also ongoing. -- Continue to increase trickle feeds to goal per dietary -- Day trip to [facility] planned for Monday for cyst gastrostomy versus endoscopic diverting gastrojejunostomy.  Patient to arrive there at 8 AM.  Appreciate all the administrator support to make [facility] available for Monday morning for this extremely important procedure.  Procedure will be with Dr. Corliss Parish and then he will be transported back here for ongoing care -- Continue ambulation multiple times per day -- Pain control and antiemetics as needed   Chief Complaint   Patient feeling well No further nausea 3 bowel movement yesterday, nonbloody No vomiting No significant abdominal pain today No fevers  Vital signs in last 24 hours: Temp:  [98 F (36.7 C)-98.3 F (36.8 C)] 98.2 F (36.8 C) (08/17 0754) Pulse Rate:  [69-79] 78 (08/17 0754) Resp:  [16-18] 16 (08/17 0754) BP: (107-123)/(67-74) 115/74 (08/17 0754) SpO2:  [96 %-98 %] 97 % (08/17 0754) Weight:  [65.5 kg] 65.5 kg (08/17 0754) Last BM Date : 02/18/23 Gen: awake, alert, NAD HEENT: anicteric  CV: RRR, no mrg Pulm: CTA b/l Abd: soft, mild tenderness in the left upper quadrant without distention, +BS throughout Ext: no c/c/e Neuro: nonfocal  Intake/Output from previous day: 08/16 0701 - 08/17 0700 In: 1883.6 [I.V.:1037.9; NG/GT:845.7] Out: -  Intake/Output this shift: No intake/output data recorded.  Lab Results: Recent Labs    02/17/23 0354  02/18/23 0354 02/19/23 0356  WBC 12.5* 11.4* 10.0  HGB 9.9* 9.7* 9.7*  HCT 30.3* 31.3* 30.0*  PLT 339 317 302   BMET Recent Labs    02/17/23 0354 02/18/23 0959 02/19/23 0356  NA 138 136 134*  K 3.5 3.9 4.1  CL 102 101 100  CO2 27 25 25   GLUCOSE 161* 181* 234*  BUN 17 21 22   CREATININE 0.76 0.66 0.74  CALCIUM 8.1* 8.0* 8.1*   LFT Recent Labs    02/18/23 0959  PROT 5.7*  ALBUMIN 2.2*  AST 34  ALT 69*  ALKPHOS 56  BILITOT 0.6     LOS: 7 days   Beverley Fiedler, MD 02/19/2023, 10:09 AM See Loretha Stapler, [facility], to contact our on call provider

## 2023-02-19 NOTE — Progress Notes (Signed)
PROGRESS NOTE    Jon Bray  HYQ:[id number] DOB: [birth year] DOA: 02/11/2023 PCP: Jon Penton, MD    Chief Complaint  Patient presents with   NG Tube Displacement    Brief Narrative:  82 y.o. male  with medical history significant of esophageal stenosis s/p Cortrack NG-tube placed on 02/02/2023, insulin-dependent DM type II, essential hypertension, CAD, paroxysmal atrial fibrillation-not on anticoagulation, gallstone pancreatitis status post cholecystectomy May 2024, chronic anemia, chronic hyponatremia and moderate malnutrition presented to emergency department due to vomiting and displaced NG tube.  Patient had recent hospital admission 01/28/2023 to 02/08/2023 due to AKI, dehydration, acute pancreatitis. Patient underwent EGD 7/29 showed esophageal stenosis, hiatal hernia and ulcerated gastric fundus with congested mucosa. As patient did not tolerated oral diet well feeding tube Cortrack placed on 7/31 and to continue liquid diet as tolerated. Patient was discharged home with [facility] and supposed to be follow-up with GI on 02/22/2023.  CT scan 8/10 shows inflammatory changes with stranding of the fat surrounding had an aggressive process and peripancreatic fluid collection.  [facility] GI team has been consulted.  Patient underwent core track tube placement however unable to be advanced and tried under fluoroscopy which was unsuccessful. -Patient being followed by GI and patient for EUS today 02/16/2023 for either drainage of pancreatic pseudocyst versus pancreatic cyst gastrostomy.   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 -Nasoenteric tube was attempted however unable to get to postpyloric and as such GI removed tube at bedside 02/15/2023. -Continue TPN for nutrition. -Patient being followed by GI and patient s/p EUS, 02/16/2023, for either drainage of pancreatic pseudocyst via transgastric approach versus pancreatic cyst gastrostomy with GI. -Patient underwent EUS 02/16/2023, however due to anatomy and multicystic nature of pseudocyst and distance from gastric wall GI unable to perform cystogastrostomy.  -GI feels patient will require double-pigtail pseudocyst gastrostomy.  -Patient however still symptomatic with significant nausea and emesis with secretions.  -Nasoenteric tube was placed in the postpyloric region and endoscopically confirmed on EUS on 02/16/2023, and patient started on trickle feeds per GI on 02/17/2023.  Trickle feeds have been advanced at 20 cc/h and patient seems to be tolerating.  Continue to advance trickle feeds as tolerated. -GI, Dr. Rhea Belton has discussed with Dr. Edyth Gunnels of [facility] and is noted will be able to accommodate cystogastrostomy versus endoscopic gastrojejunostomy on Monday which will be a day trip at [facility].  -It is noted that administration has a date to help to make [facility] available for transport of patient to [facility] on Monday morning for his procedure.- Per GI patient will need to be n.p.o. and tube feeds stopped at midnight on Sunday going into Monday prior to procedure.   -Continue antiemetics, pain control.   -Per GI.   2.  Esophageal stenosis/acute on chronic nausea/vomiting -Secondary to problem #1. -Nausea and vomiting improving.  Patient tolerating  trickle  feeds. -Continue IV antiemetics of Zofran as needed, Phenergan as needed, IV Compazine as needed if Zofran and Phenergan not helping with nausea and emesis. -See #1.  3.  Well-controlled diabetes mellitus type 2 -Hemoglobin A1c 5.8 (11/19/2022) -CBG of 192 this morning.   -Patient currently NPO. -Patient on TPN and also noted to have started on trickle feeds. -Insulin being adjusted with TPN per pharmacy. -SSI.  4.  Chronic A-fib -Rate controlled.  -Patient not an anticoagulation candidate.   -Aspirin on hold.   -GI to advise when aspirin may be resumed.  5.  History of CAD -Currently asymptomatic. -Aspirin on hold.  6.  Chronic normocytic anemia -Hemoglobin stable at 9.7.    7.  8 mm nodule, right posterior bladder wall -Outpatient follow-up with urology.  8.  Leukocytosis -Likely reactive leukocytosis secondary to problem #1. -Afebrile.  -Patient with no urinary symptoms, no respiratory symptoms. -Patient status post EUS 02/16/2023.   -Leukocytosis improving. -Continue to monitor off antibiotics.    9.  Hypokalemia -Repleted.   -Patient on TPN per pharmacy.     DVT prophylaxis: Heparin Code Status: Full Family Communication: Updated patient and wife and sister at bedside.  Disposition: TBD  Status is: Inpatient Remains inpatient appropriate because: Severity of illness   Consultants:  Gastroenterology: Dr. Leone Payor 02/12/2023 EUS: Per Dr. Meridee Score 02/16/2023  Procedures:  CT abdomen and pelvis 02/12/2023 Abdominal films 02/13/2023 NG tube placement under fluoroscopy attempt 02/14/2023.  Dr. Ova Freshwater, IR   Antimicrobials:  Anti-infectives (From admission, onward)    None         Subjective: Laying in bed.  Wife and sister at bedside.  Denies any chest pain or shortness of breath.  No nausea or emesis.  Seems to be tolerating trickle feeds.  Still with right upper quadrant and epigastric pain that has been ongoing.  Noted to ambulate in hallway with  family.    Objective: Vitals:   02/19/23 0325 02/19/23 0754 02/19/23 1608 02/19/23 1933  BP: 112/70 115/74 113/68 (!) 135/57  Pulse: 74 78 79 85  Resp: 18 16 16 17   Temp: 98.3 F (36.8 C) 98.2 F (36.8 C) 98.2 F (36.8 C) 97.6 F (36.4 C)  TempSrc: Oral     SpO2: 96% 97% 96% 97%  Weight:  65.5 kg    Height:        Intake/Output Summary (Last 24 hours) at 02/19/2023 2019 Last data filed at 02/19/2023 1813 Gross per 24 hour  Intake 1415.55 ml  Output 0 ml  Net 1415.55 ml   Filed Weights   02/11/23 1939 02/15/23 0500 02/19/23 0754  Weight: 66.7 kg 67.6 kg 65.5 kg    Examination:  General exam: NAD.  NG tube in place. Respiratory system: Lungs clear to auscultation bilaterally.  No wheezes, no crackles, no rhonchi.  Fair air movement.  Speaking in full sentences.  Cardiovascular system: Regular rate rhythm no murmurs rubs or gallops.  No JVD.  No pitting lower extremity edema. Gastrointestinal system: Abdomen is soft, nondistended, some tenderness to palpation right upper quadrant and epigastrium.  Positive bowel sounds.  No rebound.  No guarding.  Central nervous system: Alert and oriented. No focal neurological deficits. Extremities: Symmetric 5 x 5 power. Skin: No rashes, lesions or ulcers Psychiatry: Judgement and insight appear normal. Mood & affect appropriate.     Data Reviewed: I have personally reviewed following labs and imaging studies  CBC: Recent Labs  Lab 02/15/23 0136 02/16/23 0353 02/17/23 0354 02/18/23 0354  02/19/23 0356  WBC 11.8* 13.6* 12.5* 11.4* 10.0  HGB 10.5* 10.2* 9.9* 9.7* 9.7*  HCT 31.7* 31.8* 30.3* 31.3* 30.0*  MCV 84.8 84.8 85.1 90.7 88.0  PLT 332 363 339 317 302    Basic Metabolic Panel: Recent Labs  Lab 02/15/23 0136 02/16/23 0353 02/17/23 0354 02/18/23 0959 02/19/23 0356  NA 133* 136 138 136 134*  K 3.6 3.4* 3.5 3.9 4.1  CL 100 101 102 101 100  CO2 23 26 27 25 25   GLUCOSE 117* 177* 161* 181* 234*  BUN 17 18 17 21 22    CREATININE 0.72 0.70 0.76 0.66 0.74  CALCIUM 8.1* 8.2* 8.1* 8.0* 8.1*  MG 1.8 2.2 2.1 1.9 1.9  PHOS 3.6 2.9 3.3 3.3 3.4    GFR: Estimated Creatinine Clearance: 64.8 mL/min (by C-G formula based on SCr of 0.74 mg/dL).  Liver Function Tests: Recent Labs  Lab 02/13/23 0044 02/16/23 0353 02/17/23 0354 02/18/23 0959  AST 32 58* 51* 34  ALT 35 78* 81* 69*  ALKPHOS 67 66 52 56  BILITOT 1.0 1.1 0.6 0.6  PROT 6.5 6.4* 6.0* 5.7*  ALBUMIN 2.5* 2.5* 2.2* 2.2*    CBG: Recent Labs  Lab 02/18/23 2326 02/19/23 0623 02/19/23 1212 02/19/23 1611 02/19/23 1808  GLUCAP 165* 192* 216* 166* 176*     No results found for this or any previous visit (from the past 240 hour(s)).       Radiology Studies: No results found.      Scheduled Meds:  bisacodyl  10 mg Rectal Once   bisacodyl  10 mg Rectal Once   Chlorhexidine Gluconate Cloth  6 each Topical Daily   docusate  50 mg Per Tube BID   heparin  5,000 Units Subcutaneous Q8H   insulin aspart  0-15 Units Subcutaneous Q6H   pantoprazole (PROTONIX) IV  40 mg Intravenous Q12H   sennosides  5 mL Per Tube BID   sodium chloride flush  10-40 mL Intracatheter Q12H   sodium chloride flush  3 mL Intravenous Q12H   Continuous Infusions:  sodium chloride     feeding supplement (OSMOLITE 1.5 CAL) 1,000 mL (02/19/23 1813)   promethazine (PHENERGAN) injection (IM or IVPB) 12.5 mg (02/16/23 0154)   TPN ADULT (ION) 85 mL/hr at 02/19/23 1749     LOS: 7 days    Time spent: 35 minutes    Ramiro Harvest, MD [facility]   To contact the attending provider between 7A-7P or the covering provider during after hours 7P-7A, please log into the web site [email or web address] and access using universal [place] password for that web site. If you do not have the password, please call the hospital operator.  02/19/2023, 8:19 PM

## 2023-02-19 NOTE — Plan of Care (Signed)
  Problem: Health Behavior/Discharge Planning: Goal: Ability to manage health-related needs will improve Outcome: Progressing   

## 2023-02-19 NOTE — Plan of Care (Signed)
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   Problem: Clinical Measurements: Goal: Ability to maintain clinical measurements within normal limits will improve Outcome: Progressing Pt's VS WNL.    Problem: Clinical Measurements: Goal: Will remain free from infection Outcome: Progressing S/Sx of infection monitored and assessed q8 hours.  Pt has remained afebrile thus far.       Problem: Clinical Measurements: Goal: Respiratory complications will improve Outcome: Progressing Respiratory status monitored and assessed q8 hours.  Pt is on room air with PO2 saturations at 97% and respirations of 16 breaths per minute.  He has not  endorsed c/o SOB and DOE.      Problem: Activity: Goal: Risk for activity intolerance will decrease Outcome: Progressing Pt is a moderate assist of all his ADLs. He needs the assistance of RN staff to get OOB.   Problem: Nutrition: Goal: Adequate nutrition will be maintained Outcome: Progressing Pt is on a NPO per MD's orders.  He is receiving TPN via a left upper arm double lumen PICC and TF of Osmolite 1.5 continuously at 30 ml/hr which needs to be titrated up by 10ml q4 hours with a goal of 65 ml/hr per MD's orders.  Pt has been able to tolerate TF and TPN w/o c/o abdominal distention/ pain or n/v.     Problem: Elimination: Goal: Will not experience complications related to bowel motility Outcome: Progressing Pt LBM was in 02/18/2023.  He has endorse c/o constipation.    Problem: Elimination: Goal: Will not experience complications related to urinary retention Outcome: Progressing Pt has denied c/o  dysuria or abdominal distention/ pain.   Problem: Pain Management: Goal: General experience of comfort will improve Outcome: Progressing Pt has denied c/o pain thus far.     Problem: Safety: Goal: Ability to remain free from injury will improve Outcome: Progressing Pt has remained free from falls thus far.  Instructed pt to utilize RN call light for assistance.  Hourly rounds performed.  Bed alarm implemented to keep pt safe from falls.  Settings activated to third most sensitive mode.  Bed in lowest position, locked with two upper side rails engaged.  Belongings and call light within reach.    Problem: Skin Integrity: Goal: Risk for impaired skin integrity will decrease Outcome: Progressing Skin integrity monitored and assessed q-shift. Instructed pt to turn q2 hours to prevent further skin impairment.  Tubes and drains assessed for device related pressure sores.  Pt is continent of both bowel and bladder.

## 2023-02-20 DIAGNOSIS — Z4659 Encounter for fitting and adjustment of other gastrointestinal appliance and device: Secondary | ICD-10-CM | POA: Diagnosis not present

## 2023-02-20 DIAGNOSIS — I10 Essential (primary) hypertension: Secondary | ICD-10-CM | POA: Diagnosis not present

## 2023-02-20 DIAGNOSIS — K863 Pseudocyst of pancreas: Secondary | ICD-10-CM | POA: Diagnosis not present

## 2023-02-20 DIAGNOSIS — R112 Nausea with vomiting, unspecified: Secondary | ICD-10-CM | POA: Diagnosis not present

## 2023-02-20 DIAGNOSIS — K859 Acute pancreatitis without necrosis or infection, unspecified: Secondary | ICD-10-CM | POA: Diagnosis not present

## 2023-02-20 DIAGNOSIS — K311 Adult hypertrophic pyloric stenosis: Secondary | ICD-10-CM | POA: Diagnosis not present

## 2023-02-20 LAB — BASIC METABOLIC PANEL
Anion gap: 10 (ref 5–15)
BUN: 24 mg/dL — ABNORMAL HIGH (ref 8–23)
CO2: 25 mmol/L (ref 22–32)
Calcium: 8.2 mg/dL — ABNORMAL LOW (ref 8.9–10.3)
Chloride: 101 mmol/L (ref 98–111)
Creatinine, Ser: 0.66 mg/dL (ref 0.61–1.24)
GFR, Estimated: >60 mL/min (ref >60)
Glucose, Bld: 269 mg/dL — ABNORMAL HIGH (ref 70–99)
Potassium: 4.5 mmol/L (ref 3.5–5.1)
Sodium: 136 mmol/L (ref 135–145)

## 2023-02-20 LAB — CBC
HCT: 31.1 % — ABNORMAL LOW (ref 39.0–52.0)
Hemoglobin: 10 g/dL — ABNORMAL LOW (ref 13.0–17.0)
MCH: 28.2 pg (ref 26.0–34.0)
MCHC: 32.2 g/dL (ref 30.0–36.0)
MCV: 87.9 fL (ref 80.0–100.0)
Platelets: 320 10*3/uL (ref 150–400)
RBC: 3.54 MIL/uL — ABNORMAL LOW (ref 4.22–5.81)
RDW: 15.9 % — ABNORMAL HIGH (ref 11.5–15.5)
WBC: 11.4 10*3/uL — ABNORMAL HIGH (ref 4.0–10.5)
nRBC: 0 % (ref 0.0–0.2)

## 2023-02-20 LAB — GLUCOSE, CAPILLARY
Glucose-Capillary: 201 mg/dL — ABNORMAL HIGH (ref 70–99)
Glucose-Capillary: 226 mg/dL — ABNORMAL HIGH (ref 70–99)
Glucose-Capillary: 250 mg/dL — ABNORMAL HIGH (ref 70–99)
Glucose-Capillary: 261 mg/dL — ABNORMAL HIGH (ref 70–99)
Glucose-Capillary: 267 mg/dL — ABNORMAL HIGH (ref 70–99)

## 2023-02-20 LAB — PHOSPHORUS: Phosphorus: 2.5 mg/dL (ref 2.5–4.6)

## 2023-02-20 LAB — MAGNESIUM: Magnesium: 2.1 mg/dL (ref 1.7–2.4)

## 2023-02-20 MED ORDER — INSULIN ASPART 100 UNIT/ML IJ SOLN
0.0000 [IU] | INTRAMUSCULAR | Status: AC
  Administered 2023-02-20: 8 [IU] via SUBCUTANEOUS
  Administered 2023-02-20 (×2): 5 [IU] via SUBCUTANEOUS

## 2023-02-20 MED ORDER — TRAVASOL 10 % IV SOLN
INTRAVENOUS | Status: AC
  Filled 2023-02-20: qty 518.4

## 2023-02-20 MED ORDER — INSULIN ASPART 100 UNIT/ML IJ SOLN
0.0000 [IU] | Freq: Four times a day (QID) | INTRAMUSCULAR | Status: DC
  Administered 2023-02-21 (×2): 3 [IU] via SUBCUTANEOUS
  Administered 2023-02-22: 5 [IU] via SUBCUTANEOUS
  Administered 2023-02-22: 8 [IU] via SUBCUTANEOUS

## 2023-02-20 NOTE — Progress Notes (Signed)
    Progress Note   Assessment    82 year old male with previous severe gallstone pancreatitis requiring ERCP and sphincterotomy with stone removal May 2024, pancreatitis complicated by fluid collections/pseudocyst with ongoing weight loss and now pseudocyst causing gastroduodenal outlet obstruction    Recommendations  1.  Pancreatic pseudocyst/gastroduodenal outlet obstruction with nausea/vomiting --he is tolerating trickle feeds at goal 65 mL/h per dietary.  TPN also ongoing. -- Day trip to [facility] planned for tomorrow for cyst gastrostomy versus endoscopic diverting gastrojejunostomy.  Patient to arrive there at 8 AM.  Appreciate all the administrator support to make [facility] available for Monday morning for this extremely important procedure.  Procedure will be with Dr. Corliss Parish and then he will be transported back here for ongoing care -- Continue ambulation multiple times per day -- Pain control and antiemetics as needed -- Stop TFs at 11 PM tonight --Hold SQ heparin for procedure tomorrow, he is ambulating, will need to be resumed on his return     Chief Complaint   Tolerating TFs at goal No more nausea or vomiting Abd tender today No BM yesterday (3 the day before) Ready for procedure tomorrow for hopeful results  Vital signs in last 24 hours: Temp:  [97.6 F (36.4 C)-98.7 F (37.1 C)] 98.5 F (36.9 C) (08/18 0746) Pulse Rate:  [67-93] 93 (08/18 0746) Resp:  [16-17] 16 (08/18 0746) BP: (110-136)/(57-72) 110/72 (08/18 0746) SpO2:  [96 %-99 %] 96 % (08/18 0746) Weight:  [65.7 kg] 65.7 kg (08/18 0326) Last BM Date : 02/19/23 Gen: awake, alert, NAD HEENT: anicteric, nasoenteric tube CV: RRR, no mrg Pulm: CTA b/l Abd: soft, slightly more tender today,  +BS throughout Ext: no c/c/e Neuro: nonfocal  08/17 0701 - 08/18 0700 In: 2106.7 [I.V.:950.1; NG/GT:1156.7] Out: 0  Intake/Output this shift: No intake/output data recorded.  Lab Results: Recent Labs     02/18/23 0354 02/19/23 0356 02/20/23 0207  WBC 11.4* 10.0 11.4*  HGB 9.7* 9.7* 10.0*  HCT 31.3* 30.0* 31.1*  PLT 317 302 320   BMET Recent Labs    02/18/23 0959 02/19/23 0356  NA 136 134*  K 3.9 4.1  CL 101 100  CO2 25 25  GLUCOSE 181* 234*  BUN 21 22  CREATININE 0.66 0.74  CALCIUM 8.0* 8.1*   LFT Recent Labs    02/18/23 0959  PROT 5.7*  ALBUMIN 2.2*  AST 34  ALT 69*  ALKPHOS 56  BILITOT 0.6    Studies/Results: No results found.    LOS: 8 days   Beverley Fiedler, MD 02/20/2023, 12:11 PM See Loretha Stapler, [facility], to contact our on call provider

## 2023-02-20 NOTE — Plan of Care (Signed)
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  Pt is taking a day trip to [facility] tomorrow 02/21/2023 for a planned cyst gastrostomy versus endoscopic diverting gastrojejunostomy.     Problem: Clinical Measurements: Goal: Ability to maintain clinical measurements within normal limits will improve Outcome: Progressing Pt's VS WNL.    Problem: Clinical Measurements: Goal: Will remain free from infection Outcome: Progressing S/Sx of infection monitored and assessed q8 hours.  Pt has remained afebrile thus far.       Problem: Clinical Measurements: Goal: Respiratory complications will improve Outcome: Progressing Respiratory status monitored and assessed q8 hours.  Pt is on room air with PO2 saturations at 96-97% and respirations of 16 breaths per minute.  He has not  endorsed c/o SOB and DOE.      Problem: Activity: Goal: Risk for activity intolerance will decrease Outcome: Progressing Pt is a moderate assist of all his ADLs. He needs the assistance of RN staff to get OOB.   Problem: Nutrition: Goal: Adequate nutrition will be maintained Outcome: Progressing Pt is on a NPO per MD's orders.  He is receiving TPN via a left upper arm double lumen PICC and TF of Osmolite 1.5 continuously at 65 ml/hr which goal per MD's orders.  Pt has been able to tolerate TF and TPN w/o c/o abdominal distention/ pain or n/v.     Problem: Elimination: Goal: Will not experience complications related to bowel motility Outcome: Progressing Pt LBM was in 02/20/2023.  He has endorse c/o constipation.    Problem: Elimination: Goal: Will not experience  complications related to urinary retention Outcome: Progressing Pt has denied c/o dysuria or abdominal distention/ pain.   Problem: Pain Management: Goal: General experience of comfort will improve Outcome: Progressing Pt has denied c/o pain thus far.     Problem: Safety: Goal: Ability to remain free from injury will improve Outcome: Progressing Pt has remained free from falls thus far.  Instructed pt to utilize RN call light for assistance.  Hourly rounds performed.  Bed alarm implemented to keep pt safe from falls.  Settings activated to third most sensitive mode.  Bed in lowest position, locked with two upper side rails engaged.  Belongings and call light within reach.    Problem: Skin Integrity: Goal: Risk for impaired skin integrity will decrease Outcome: Progressing Skin integrity monitored and assessed q-shift. Instructed pt to turn q2 hours to prevent further skin impairment.  Tubes and drains assessed for device related pressure sores.  Pt is continent of both bowel and bladder.

## 2023-02-20 NOTE — Progress Notes (Addendum)
PHARMACY - TOTAL PARENTERAL NUTRITION CONSULT NOTE  Indication: Gallstone pancreatitis with GOO  Patient Measurements: Height: 5\' 8"  (172.7 cm) Weight: 65.7 kg (144 lb 13.5 oz) IBW/kg (Calculated) : 68.4 TPN AdjBW (KG): 66.7 Body mass index is 22.02 kg/m.  Assessment:  34 YOM presented on 8/9 with NGT displacement.  Patient/family reports admitted in May for 21 days for severe pancreatitis.  He was discharged on an oral diet and did not eat well.  He was readmitted in July, from which he was discharged on tube feeding.  He was on TF for 3-4 days before he vomited and the NGT was dislodged.  Patient/family reports losing ~40 lbs since May and his intake has been poor since. RD and IR attempted post-pyloric tube placement and were unsuccessful.  Pharmacy consulted to manage TPN for pancreatitic pseudocyst and GOO.  Planning transfer to [facility] on 8/19 for cystogastrostomy versus endoscopic gastrojejunostomy. Will stop TF at midnight prior to procedure and return to [facility] after procedure.  Glucose / Insulin:  BG 166-267 (TF advanced), used 21 units SSI/ 24hr + 25 units in TPN DM2 on Basaglar 10/d PTA, A1C 5.8.  Electrolytes: CoCa 9.6, others WNL  Renal: SCr 0.66, BUN 24 Hepatic: ALT mildly elevated (not d/t TPN), alk phos/AST/tbili WNL, albumin 2.2 Intake / Output; MIVF: TF@ 16ml/hr- will stop at 2300 for procedure 8/19; UOP none charted, LBM 8/16 GI Imaging:  8/10 CT: increase in pancreatic pseudocyst size, severe sigmoid diverticulosis, no bowel obstruction  8/11 KUB: nonobstructive bowel gas pattern, mild colonic stool 8/14 KUB: contrast in colon, minimal SB gas  GI Surgeries / Procedures: 8/12 Naso G Tube placement - unable to advance into duodenum d/t anatomy  8/14 upper EUS: LA grade C esophagitis without bleeding, pancreatic parenchymal abnormalities, cystic lesion in pancreatic head, feeding tube placed  Central access: PICC placed on 02/15/23 TPN start date: 02/15/23  Nutritional  Goals:  RD Estimated Needs Total Energy Estimated Needs: 2100-2300 kcal/d Total Protein Estimated Needs: 105-125 g/d Total Fluid Estimated Needs: 2.1-2.3L/d  Current Nutrition:  TPN 8/15 TF at 20 ml/hr- some nausea per RN   8/16 TF at 20 ml/hr- tolerating without nausea per RN   8/17 TF at 20 ml/hr- advance per GI/RD  8/18 TF to goal 65 ml/hr   Plan:  Discussed with MD, ok to halve TPN to 40 mL/hr at 1800 since TF is currently at goal but will be stopped at 2300 for procedure. Patient will be receiving full TPN and full TF for ~15 hours until new bag is hung. TPN at 40 ml/hr provides 52g AA, 995 kcal, meeting ~50% estimated needs but full needs met with TF.  Electrolytes in TPN: decrease Na 125 mEq/L due to volume limit, K 40 mEq/L, Ca 5 mEq/L, Mg 12 mEq/L, Phos 15 mmol/L. Cl:Ac 1:1   Add standard MVI and trace elements to TPN Increase moderate SSI and BG checks to Q4hr for TF coverage, which will stop at midnight tonight. Then decrease back to Q6hr  Decrease to 12 units of regular insulin in TPN  Standard TPN labs on Mon/Thurs and PRN F/u TF restart after procedure 8/19 vs increase TPN back to goal 85 ml/hr  Alphia Moh, PharmD, BCPS, [facility] Clinical Pharmacist  Please check AMION for all [facility]acy phone numbers After 10:00 PM, call Main Pharmacy [phone]

## 2023-02-20 NOTE — Progress Notes (Signed)
PROGRESS NOTE    Jon Bray  WUJ:[id number] DOB: 06/16/1941 DOA: 02/11/2023 PCP: Danella Penton, MD    Chief Complaint  Patient presents with   NG Tube Displacement    Brief Narrative:  82 y.o. male  with medical history significant of esophageal stenosis s/p Cortrack NG-tube placed on 02/02/2023, insulin-dependent DM type II, essential hypertension, CAD, paroxysmal atrial fibrillation-not on anticoagulation, gallstone pancreatitis status post cholecystectomy May 2024, chronic anemia, chronic hyponatremia and moderate malnutrition presented to emergency department due to vomiting and displaced NG tube.  Patient had recent hospital admission 01/28/2023 to 02/08/2023 due to AKI, dehydration, acute pancreatitis. Patient underwent EGD 7/29 showed esophageal stenosis, hiatal hernia and ulcerated gastric fundus with congested mucosa. As patient did not tolerated oral diet well feeding tube Cortrack placed on 7/31 and to continue liquid diet as tolerated. Patient was discharged home with [facility] and supposed to be follow-up with GI on 02/22/2023.  CT scan 8/10 shows inflammatory changes with stranding of the fat surrounding had an aggressive process and peripancreatic fluid collection.  [facility] GI team has been consulted.  Patient underwent core track tube placement however unable to be advanced and tried under fluoroscopy which was unsuccessful. -Patient being followed by GI and patient for EUS today 02/16/2023 for either drainage of pancreatic pseudocyst versus pancreatic cyst gastrostomy.   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 -Nasoenteric tube was attempted however unable to get to postpyloric and as such GI removed tube at bedside 02/15/2023. -Continue TPN for nutrition. -Patient being followed by GI and patient s/p EUS, 02/16/2023, for either drainage of pancreatic pseudocyst via transgastric approach versus pancreatic cyst gastrostomy with GI. -Patient underwent EUS 02/16/2023, however due to anatomy and multicystic nature of pseudocyst and distance from gastric wall GI unable to perform cystogastrostomy.  -GI feels patient will require double-pigtail pseudocyst gastrostomy.  -Patient however still symptomatic with significant nausea and emesis with secretions.  -Nasoenteric tube was placed in the postpyloric region and endoscopically confirmed on EUS on 02/16/2023, and patient started on trickle feeds per GI on 02/17/2023.  Trickle feeds have been advanced at 20 cc/h and patient seems to be tolerating.  Continue to advance trickle feeds as tolerated. -GI, Dr. Rhea Belton has discussed with Dr. Edyth Gunnels of [facility] and is noted will be able to accommodate cystogastrostomy versus endoscopic gastrojejunostomy on Monday, 02/21/2023 which will be a day trip at [facility].  -It is noted that administration has helped to make [facility] available for transport of patient to [facility] on Monday morning for his important procedure.- Per GI patient will need to be n.p.o. and tube feeds stopped at midnight today, Sunday going into Monday prior to procedure.   -Continue antiemetics, pain control.   -Per GI.   2.  Esophageal stenosis/acute on chronic nausea/vomiting -Secondary to problem #1. -Nausea and vomiting improving.  Patient  tolerating  tube feeds which is currently at goal.  -Continue IV antiemetics of Zofran as needed, Phenergan as needed, IV Compazine as needed if Zofran and Phenergan not helping with nausea and emesis. -See #1.  3.  Well-controlled diabetes mellitus type 2 -Hemoglobin A1c 5.8 (11/19/2022) -CBG of 267 this morning.   -Patient currently NPO. -Patient on TPN and also noted to be on tube feeds at goal.  -Insulin being adjusted with TPN per pharmacy. -SSI.  4.  Chronic A-fib -Rate controlled.  -Patient not an anticoagulation candidate.   -Aspirin on hold.   -GI to advise when aspirin may be resumed.  5.  History of CAD -Currently asymptomatic. -Aspirin on hold.  6.  Chronic normocytic anemia -Hemoglobin stable at 10.0.  7.  8 mm nodule, right posterior bladder wall -Outpatient follow-up with urology.  8.  Leukocytosis -Likely reactive leukocytosis secondary to problem #1. -Afebrile.  -Patient with no urinary symptoms, no respiratory symptoms. -Patient status post EUS 02/16/2023.   -Leukocytosis improving and fluctuating currently at 11.4.. -Continue to monitor off antibiotics.    9.  Hypokalemia -Repleted.   -Patient on TPN per pharmacy.     DVT prophylaxis: Heparin Code Status: Full Family Communication: Updated patient, no family at bedside.  Disposition: TBD  Status is: Inpatient Remains inpatient appropriate because: Severity of illness   Consultants:  Gastroenterology: Dr. Leone Payor 02/12/2023 EUS: Per Dr. Meridee Score 02/16/2023  Procedures:  CT abdomen and pelvis 02/12/2023 Abdominal films 02/13/2023 NG tube placement under fluoroscopy attempt 02/14/2023.  Dr. Ova Freshwater, IR   Antimicrobials:  Anti-infectives (From admission, onward)    None         Subjective: Patient sitting up in recliner.  Denies any chest pain or shortness of breath.  Denies any nausea or emesis.  Tube feeds currently at goal which patient seems to be tolerating.  No significant  worsening of abdominal pain.  Objective: Vitals:   02/19/23 1933 02/20/23 0258 02/20/23 0326 02/20/23 0746  BP: (!) 135/57 115/60 136/66 110/72  Pulse: 85 67 83 93  Resp: 17 16 16 16   Temp: 97.6 F (36.4 C) 98.7 F (37.1 C) 98.2 F (36.8 C) 98.5 F (36.9 C)  TempSrc:    Oral  SpO2: 97% 99% 97% 96%  Weight:   65.7 kg   Height:        Intake/Output Summary (Last 24 hours) at 02/20/2023 0938 Last data filed at 02/20/2023 0500 Gross per 24 hour  Intake 2106.73 ml  Output 0 ml  Net 2106.73 ml   Filed Weights   02/15/23 0500 02/19/23 0754 02/20/23 0326  Weight: 67.6 kg 65.5 kg 65.7 kg    Examination:  General exam: NAD.  NG tube in place. Respiratory system: CTAB.  No wheezes, no crackles, no rhonchi.  Fair air movement.  Speaking in full sentences.  Cardiovascular system: RRR no murmurs rubs or gallops.  No JVD.  No pitting lower extremity edema.  Gastrointestinal system: Abdomen is soft, nondistended, decreased tenderness to palpation in the epigastrium and right upper quadrant.  Positive bowel sounds.  No rebound.  No guarding.  Central nervous system: Alert and oriented. No focal neurological deficits. Extremities: Symmetric 5 x 5 power. Skin: No rashes, lesions or ulcers Psychiatry: Judgement and insight appear normal. Mood & affect appropriate.     Data Reviewed: I have personally reviewed following labs and imaging studies  CBC: Recent Labs  Lab 02/16/23 0353 02/17/23 0354 02/18/23 0354 02/19/23 0356 02/20/23 0207  WBC 13.6* 12.5* 11.4* 10.0 11.4*  HGB 10.2* 9.9* 9.7* 9.7* 10.0*  HCT 31.8* 30.3* 31.3* 30.0* 31.1*  MCV 84.8 85.1 90.7 88.0 87.9  PLT 363 339 317 302 320    Basic Metabolic Panel: Recent Labs  Lab 02/15/23 0136 02/16/23 0353 02/17/23 0354 02/18/23 0959 02/19/23 0356  NA 133* 136 138 136 134*  K 3.6 3.4* 3.5 3.9 4.1  CL 100 101 102 101 100  CO2 23 26 27 25 25   GLUCOSE 117* 177* 161* 181* 234*  BUN 17 18 17 21 22   CREATININE 0.72  0.70 0.76 0.66 0.74  CALCIUM 8.1* 8.2* 8.1* 8.0* 8.1*  MG 1.8 2.2 2.1 1.9 1.9  PHOS 3.6 2.9 3.3 3.3 3.4    GFR: Estimated Creatinine Clearance: 65 mL/min (by C-G formula based on SCr of 0.74 mg/dL).  Liver Function Tests: Recent Labs  Lab 02/16/23 0353 02/17/23 0354 02/18/23 0959  AST 58* 51* 34  ALT 78* 81* 69*  ALKPHOS 66 52 56  BILITOT 1.1 0.6 0.6  PROT 6.4* 6.0* 5.7*  ALBUMIN 2.5* 2.2* 2.2*    CBG: Recent Labs  Lab 02/19/23 1212 02/19/23 1611 02/19/23 1808 02/20/23 0002 02/20/23 0603  GLUCAP 216* 166* 176* 226* 267*     No results found for this or any previous visit (from the past 240 hour(s)).       Radiology Studies: No results found.      Scheduled Meds:  bisacodyl  10 mg Rectal Once   bisacodyl  10 mg Rectal Once   Chlorhexidine Gluconate Cloth  6 each Topical Daily   docusate  50 mg Per Tube BID   heparin  5,000 Units Subcutaneous Q8H   insulin aspart  0-15 Units Subcutaneous Q4H   [START ON 02/21/2023] insulin aspart  0-15 Units Subcutaneous Q6H   pantoprazole (PROTONIX) IV  40 mg Intravenous Q12H   sennosides  5 mL Per Tube BID   sodium chloride flush  10-40 mL Intracatheter Q12H   sodium chloride flush  3 mL Intravenous Q12H   Continuous Infusions:  sodium chloride     feeding supplement (OSMOLITE 1.5 CAL) 1,000 mL (02/20/23 0831)   promethazine (PHENERGAN) injection (IM or IVPB) 12.5 mg (02/16/23 0154)   TPN ADULT (ION) 85 mL/hr at 02/19/23 1749   TPN ADULT (ION)       LOS: 8 days    Time spent: 35 minutes    Ramiro Harvest, MD [facility]   To contact the attending provider between 7A-7P or the covering provider during after hours 7P-7A, please log into the web site [email or web address] and access using universal [place] password for that web site. If you do not have the password, please call the hospital operator.  02/20/2023, 9:38 AM

## 2023-02-21 ENCOUNTER — Encounter (HOSPITAL_COMMUNITY): Payer: Self-pay | Admitting: Gastroenterology

## 2023-02-21 DIAGNOSIS — R933 Abnormal findings on diagnostic imaging of other parts of digestive tract: Secondary | ICD-10-CM | POA: Diagnosis not present

## 2023-02-21 DIAGNOSIS — K219 Gastro-esophageal reflux disease without esophagitis: Secondary | ICD-10-CM | POA: Diagnosis not present

## 2023-02-21 DIAGNOSIS — K863 Pseudocyst of pancreas: Secondary | ICD-10-CM | POA: Diagnosis not present

## 2023-02-21 DIAGNOSIS — R932 Abnormal findings on diagnostic imaging of liver and biliary tract: Secondary | ICD-10-CM | POA: Diagnosis not present

## 2023-02-21 DIAGNOSIS — Z79899 Other long term (current) drug therapy: Secondary | ICD-10-CM | POA: Diagnosis not present

## 2023-02-21 DIAGNOSIS — K8689 Other specified diseases of pancreas: Secondary | ICD-10-CM | POA: Diagnosis not present

## 2023-02-21 DIAGNOSIS — K311 Adult hypertrophic pyloric stenosis: Secondary | ICD-10-CM | POA: Diagnosis not present

## 2023-02-21 DIAGNOSIS — I10 Essential (primary) hypertension: Secondary | ICD-10-CM | POA: Diagnosis not present

## 2023-02-21 DIAGNOSIS — Z4659 Encounter for fitting and adjustment of other gastrointestinal appliance and device: Secondary | ICD-10-CM | POA: Diagnosis not present

## 2023-02-21 DIAGNOSIS — K859 Acute pancreatitis without necrosis or infection, unspecified: Secondary | ICD-10-CM | POA: Diagnosis not present

## 2023-02-21 DIAGNOSIS — Z7982 Long term (current) use of aspirin: Secondary | ICD-10-CM | POA: Diagnosis not present

## 2023-02-21 DIAGNOSIS — R112 Nausea with vomiting, unspecified: Secondary | ICD-10-CM | POA: Diagnosis not present

## 2023-02-21 LAB — COMPREHENSIVE METABOLIC PANEL
ALT: 44 U/L (ref 0–44)
AST: 27 U/L (ref 15–41)
Albumin: 2.5 g/dL — ABNORMAL LOW (ref 3.5–5.0)
Alkaline Phosphatase: 65 U/L (ref 38–126)
Anion gap: 9 (ref 5–15)
BUN: 21 mg/dL (ref 8–23)
CO2: 24 mmol/L (ref 22–32)
Calcium: 8.1 mg/dL — ABNORMAL LOW (ref 8.9–10.3)
Chloride: 102 mmol/L (ref 98–111)
Creatinine, Ser: 0.67 mg/dL (ref 0.61–1.24)
GFR, Estimated: >60 mL/min (ref >60)
Glucose, Bld: 141 mg/dL — ABNORMAL HIGH (ref 70–99)
Potassium: 4.3 mmol/L (ref 3.5–5.1)
Sodium: 135 mmol/L (ref 135–145)
Total Bilirubin: 0.5 mg/dL (ref 0.3–1.2)
Total Protein: 6.4 g/dL — ABNORMAL LOW (ref 6.5–8.1)

## 2023-02-21 LAB — GLUCOSE, CAPILLARY
Glucose-Capillary: 117 mg/dL — ABNORMAL HIGH (ref 70–99)
Glucose-Capillary: 143 mg/dL — ABNORMAL HIGH (ref 70–99)
Glucose-Capillary: 156 mg/dL — ABNORMAL HIGH (ref 70–99)
Glucose-Capillary: 162 mg/dL — ABNORMAL HIGH (ref 70–99)
Glucose-Capillary: 185 mg/dL — ABNORMAL HIGH (ref 70–99)

## 2023-02-21 LAB — PHOSPHORUS: Phosphorus: 3.1 mg/dL (ref 2.5–4.6)

## 2023-02-21 LAB — CBC
HCT: 33.7 % — ABNORMAL LOW (ref 39.0–52.0)
Hemoglobin: 10.6 g/dL — ABNORMAL LOW (ref 13.0–17.0)
MCH: 27.3 pg (ref 26.0–34.0)
MCHC: 31.5 g/dL (ref 30.0–36.0)
MCV: 86.9 fL (ref 80.0–100.0)
Platelets: 307 10*3/uL (ref 150–400)
RBC: 3.88 MIL/uL — ABNORMAL LOW (ref 4.22–5.81)
RDW: 16.3 % — ABNORMAL HIGH (ref 11.5–15.5)
WBC: 11.6 10*3/uL — ABNORMAL HIGH (ref 4.0–10.5)
nRBC: 0 % (ref 0.0–0.2)

## 2023-02-21 LAB — MAGNESIUM: Magnesium: 2.3 mg/dL (ref 1.7–2.4)

## 2023-02-21 LAB — TRIGLYCERIDES: Triglycerides: 97 mg/dL (ref <150)

## 2023-02-21 MED ORDER — SENNOSIDES 8.8 MG/5ML PO SYRP
5.0000 mL | ORAL_SOLUTION | Freq: Two times a day (BID) | ORAL | Status: DC
  Administered 2023-02-21 – 2023-02-23 (×4): 5 mL via ORAL
  Filled 2023-02-21 (×4): qty 5

## 2023-02-21 MED ORDER — TRAVASOL 10 % IV SOLN: INTRAVENOUS | Status: DC

## 2023-02-21 MED ORDER — DOCUSATE SODIUM 50 MG/5ML PO LIQD
50.0000 mg | Freq: Two times a day (BID) | ORAL | Status: DC
  Administered 2023-02-22 – 2023-02-25 (×6): 50 mg via ORAL
  Filled 2023-02-21 (×7): qty 10

## 2023-02-21 MED ORDER — TRAVASOL 10 % IV SOLN
INTRAVENOUS | Status: AC
  Filled 2023-02-21: qty 1101.6

## 2023-02-21 MED ORDER — PANCRELIPASE (LIP-PROT-AMYL) 12000-38000 UNITS PO CPEP
12000.0000 [IU] | ORAL_CAPSULE | Freq: Three times a day (TID) | ORAL | Status: DC
  Administered 2023-02-21 – 2023-02-22 (×4): 12000 [IU] via ORAL
  Filled 2023-02-21 (×5): qty 1

## 2023-02-21 NOTE — Progress Notes (Signed)
    Progress Note   Subjective  Chief Complaint: Gallstone pancreatitis  Patient returned from [facility] today after procedure with Dr. Raylene Miyamoto.  He reports that he is feeling fairly well but he would like to eat something, unsure what his diet should be.  Denies any acute pain at the moment.   Objective   Vital signs in last 24 hours: Temp:  [98.2 F (36.8 C)-98.7 F (37.1 C)] 98.2 F (36.8 C) (08/19 1238) Pulse Rate:  [89-94] 89 (08/19 1238) Resp:  [15-16] 16 (08/19 1238) BP: (108-124)/(58-64) 117/64 (08/19 1238) SpO2:  [96 %-99 %] 97 % (08/19 1238) Weight:  [65.4 kg] 65.4 kg (08/19 0433) Last BM Date : 02/11/23 General:    white male in NAD Heart:  Regular rate and rhythm; no murmurs Lungs: Respirations even and unlabored, lungs CTA bilaterally Abdomen:  Soft, nontender and nondistended. Normal bowel sounds. Psych:  Cooperative. Normal mood and affect.  Intake/Output from previous day: 08/18 0701 - 08/19 0700 In: 905.1 [NG/GT:905.1] Out: -    Lab Results: Recent Labs    02/19/23 0356 02/20/23 0207 02/21/23 0405  WBC 10.0 11.4* 11.6*  HGB 9.7* 10.0* 10.6*  HCT 30.0* 31.1* 33.7*  PLT 302 320 307   BMET Recent Labs    02/19/23 0356 02/20/23 1146 02/21/23 0405  NA 134* 136 135  K 4.1 4.5 4.3  CL 100 101 102  CO2 25 25 24   GLUCOSE 234* 269* 141*  BUN 22 24* 21  CREATININE 0.74 0.66 0.67  CALCIUM 8.1* 8.2* 8.1*   LFT Recent Labs    02/21/23 0405  PROT 6.4*  ALBUMIN 2.5*  AST 27  ALT 44  ALKPHOS 65  BILITOT 0.5     Assessment / Plan:   Assessment: 1.  Severe gallstone pancreatitis: Requiring ERCP and sphincterotomy with stone removal in May 2024, pancreatitis complicated by fluid collection/pseudocyst with ongoing weight loss and acidosis because of gastroduodenal outlet obstruction, status post diverting gastrojejunostomy this morning at [facility], feeling well  Plan: 1.  Per procedure note from today patient can be on a full liquid diet this evening  and then transitioned over to a stent diet tomorrow.  He was given paperwork regarding this. 2.  Continue pain control and antiemetics as needed  Thank you for your kind consultation.    LOS: 9 days   Unk Lightning  02/21/2023, 1:43 PM

## 2023-02-21 NOTE — Progress Notes (Signed)
Received call from [facility], transport few mins out. Outbound call made to [facility], no answer, left voicemail.

## 2023-02-21 NOTE — Progress Notes (Signed)
Consult received to reconnect TPN line after patient has returned from [facility]. Educated nurse on policy for TPN reconnection. TPN is never to be reconnected after a disconnection d/t increase risk of infection. Instructed to follow unit policy for infusing appropriate infusion until new TPN bag is hung by IV team this evening. Nurse, Maxie Better RN, reported she would "reach out to pharmacy." Tomasita Morrow, RN VAST

## 2023-02-21 NOTE — Plan of Care (Signed)
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  He took a day trip to [facility] today 02/21/2023 for a planned cyst gastrostomy with stent placement.   Problem: Clinical Measurements: Goal: Ability to maintain clinical measurements within normal limits will improve Outcome: Progressing Pt's VS WNL.    Problem: Clinical Measurements: Goal: Will remain free from infection Outcome: Progressing S/Sx of infection monitored and assessed q8 hours.  Pt has remained afebrile thus far.       Problem: Clinical Measurements: Goal: Respiratory complications will improve Outcome: Progressing Respiratory status monitored and assessed q8 hours.  Pt is on room air with PO2 saturations at 97-98% and respirations of 16 breaths per minute.  He has not  endorsed c/o SOB and DOE.      Problem: Activity: Goal: Risk for activity intolerance will decrease Outcome: Progressing Pt is a moderate assist of all his ADLs. He needs the assistance of RN staff to get OOB.   Problem: Nutrition: Goal: Adequate nutrition will be maintained Outcome: Progressing Pt is on a full liquid diet per MD's orders.  He is also receiving TPN via a left upper arm double lumen PICC per MD's orders.  Pt has been able to tolerate his diet and TPN w/o c/o abdominal distention/ pain or n/v.     Problem: Elimination: Goal: Will not experience complications related to bowel motility Outcome: Progressing Pt LBM was in 02/20/2023.  He has endorse c/o constipation.    Problem: Elimination: Goal: Will not experience complications related to urinary retention Outcome: Progressing Pt has denied  c/o dysuria or abdominal distention/ pain.   Problem: Pain Management: Goal: General experience of comfort will improve Outcome: Progressing Pt has denied c/o pain thus far.     Problem: Safety: Goal: Ability to remain free from injury will improve Outcome: Progressing Pt has remained free from falls thus far.  Instructed pt to utilize RN call light for assistance.  Hourly rounds performed.  Bed alarm implemented to keep pt safe from falls.  Settings activated to third most sensitive mode.  Bed in lowest position, locked with two upper side rails engaged.  Belongings and call light within reach.    Problem: Skin Integrity: Goal: Risk for impaired skin integrity will decrease Outcome: Progressing Skin integrity monitored and assessed q-shift. Instructed pt to turn q2 hours to prevent further skin impairment.  Tubes and drains assessed for device related pressure sores.  Pt is continent of both bowel and bladder.

## 2023-02-21 NOTE — Progress Notes (Signed)
PHARMACY - TOTAL PARENTERAL NUTRITION CONSULT NOTE  Indication: Gallstone pancreatitis with GOO  Patient Measurements: Height: 5\' 8"  (172.7 cm) Weight: 65.4 kg (144 lb 2.9 oz) IBW/kg (Calculated) : 68.4 TPN AdjBW (KG): 66.7 Body mass index is 21.92 kg/m.  Assessment:  62 YOM presented on 8/9 with NGT displacement.  Patient/family reports admitted in May for 21 days for severe pancreatitis.  He was discharged on an oral diet and did not eat well.  He was readmitted in July, from which he was discharged on tube feeding.  He was on TF for 3-4 days before he vomited and the NGT was dislodged.  Patient/family reports losing ~40 lbs since May and his intake has been poor since. RD and IR attempted post-pyloric tube placement and were unsuccessful.  Pharmacy consulted to manage TPN for pancreatitic pseudocyst and GOO.  Planning transfer to [facility] on 8/19 for cystogastrostomy versus endoscopic gastrojejunostomy. Will stop TF at midnight prior to procedure and return to [facility] after procedure.  Glucose / Insulin: DM2 on Basaglar 10/d PTA, A1C 5.8.  CBGs 117-269, used 21 units SSI/ 24hr + 12 units in TPN Electrolytes: Na 135, CoCa 9.3, Mg 2.3, others WNL  Renal: SCr  0.67, BUN WNL Hepatic: ALT mildly elevated (not d/t TPN), alk phos/AST/tbili WNL, albumin 2.2 Intake / Output; MIVF: was on TF@ 28ml/hr [stopped 2300 8/18 for day procedure at [facility] on 8/19]; UOP x6 charted, LBM 8/17 GI Imaging:  8/10 CT: increase in pancreatic pseudocyst size, severe sigmoid diverticulosis, no bowel obstruction  8/11 KUB: nonobstructive bowel gas pattern, mild colonic stool 8/14 KUB: contrast in colon, minimal SB gas  GI Surgeries / Procedures: 8/12 Naso G Tube placement - unable to advance into duodenum d/t anatomy  8/14 upper EUS: LA grade C esophagitis without bleeding, pancreatic parenchymal abnormalities, cystic lesion in pancreatic head, feeding tube placed 8/19- Day trip to [facility] planned for cyst gastrostomy versus  endoscopic diverting gastrojejunostomy   Central access: PICC placed on 02/15/23 TPN start date: 02/15/23  Nutritional Goals:  RD Estimated Needs Total Energy Estimated Needs: 2100-2300 kcal/d Total Protein Estimated Needs: 105-125 g/d Total Fluid Estimated Needs: 2.1-2.3L/d  Current Nutrition:  TPN 8/15 TF at 20 ml/hr- some nausea per RN   8/16 TF at 20 ml/hr- tolerating without nausea per RN   8/17 TF at 20 ml/hr- advance per GI/RD  8/17 09:30 TF 20 ml/hr > advance 10 cc q4 hours to goal 65 ml/hr- per GI- stopped at 2300 for GI procedure 8/19 8/18 TF 65 ml/hr- tolerating  8/19 NPO (surgery [facility])  Plan:  Spoke with Dr. Janee Morn via [place]. Write TPN as full rate for today Start TPN at prior goal rate of 85 mL/hr at 1800, provides 110g AA, 2113 kcal, meeting 100% estimated needs  Electrolytes in TPN: Na 150 mEq/L, K 40 mEq/L, Ca 5 mEq/L, Mg 10 mEq/L, Phos 15 mmol/L. Cl:Ac 1:1   Add standard MVI and trace elements to TPN Continue moderate SSI and BG checks to Q6H  25 units of regular insulin to TPN  Standard TPN labs on Mon/Thurs and PRN F/u TF advancement after procedure 8/19 and wean TPN as able   Greta Doom BS, PharmD, BCPS Clinical Pharmacist 02/21/2023 11:00 AM  Contact: [phone] after 3 PM  "Be curious, not judgmental..." -Debbora Dus

## 2023-02-21 NOTE — Progress Notes (Addendum)
PROGRESS NOTE    Jon Bray  IHK:[id number] DOB: Jun 03, 1941 DOA: 02/11/2023 PCP: Danella Penton, MD    Chief Complaint  Patient presents with   NG Tube Displacement    Brief Narrative:  82 y.o. male  with medical history significant of esophageal stenosis s/p Cortrack NG-tube placed on 02/02/2023, insulin-dependent DM type II, essential hypertension, CAD, paroxysmal atrial fibrillation-not on anticoagulation, gallstone pancreatitis status post cholecystectomy May 2024, chronic anemia, chronic hyponatremia and moderate malnutrition presented to emergency department due to vomiting and displaced NG tube.  Patient had recent hospital admission 01/28/2023 to 02/08/2023 due to AKI, dehydration, acute pancreatitis. Patient underwent EGD 7/29 showed esophageal stenosis, hiatal hernia and ulcerated gastric fundus with congested mucosa. As patient did not tolerated oral diet well feeding tube Cortrack placed on 7/31 and to continue liquid diet as tolerated. Patient was discharged home with [facility] and supposed to be follow-up with GI on 02/22/2023.  CT scan 8/10 shows inflammatory changes with stranding of the fat surrounding had an aggressive process and peripancreatic fluid collection.  [facility] GI team has been consulted.  Patient underwent core track tube placement however unable to be advanced and tried under fluoroscopy which was unsuccessful. -Patient being followed by GI and patient s/p EUS, 02/16/2023 for either drainage of pancreatic pseudocyst versus pancreatic cyst gastrostomy. -Patient was seen by Dr. Edyth Gunnels at [facility] this morning, 02/21/2023 and patient underwent diverting gastrojejunostomy.   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 -Nasoenteric tube was attempted however unable to get to postpyloric and as such GI removed tube at bedside 02/15/2023. -Continue TPN for nutrition. -Patient being followed by GI and patient s/p EUS, 02/16/2023, for either drainage of pancreatic pseudocyst via transgastric approach versus pancreatic cyst gastrostomy with GI. -Patient underwent EUS 02/16/2023, however due to anatomy and multicystic nature of pseudocyst and distance from gastric wall GI unable to perform cystogastrostomy.  -GI feels patient will require double-pigtail pseudocyst gastrostomy.  -Patient however still symptomatic with significant nausea and emesis with secretions.  -Nasoenteric tube was placed in the postpyloric region and endoscopically confirmed on EUS on 02/16/2023, and patient started on trickle feeds per GI on 02/17/2023.  Trickle feeds have been advanced at 20 cc/h and patient seems to be tolerating.  Continue to advance trickle feeds as tolerated. -GI, Dr. Rhea Belton has discussed with Dr. Edyth Gunnels of [facility] and is noted will be able to accommodate cystogastrostomy versus endoscopic gastrojejunostomy today, Monday, 02/21/2023 which will be a day trip at [facility]  -Patient underwent procedures at this morning at [facility] per GI,.which showed a walled off necrosis in the peripancreatic region, EUS guided gastrojejunostomy was done. -GI postprocedure recommended a full liquid diet and advance to a stent diet. -GI at [facility], Dr. Jamse Mead recommended CT abdomen and pelvis in 1 month with follow-up with GI at [facility] in 1  to 2 months  after imaging study. -Will place patient on full liquid diet today and if tolerates could advance to a stent to diet per GI recommendations tomorrow.  -Will also start patient on Creon. -Continue antiemetics, pain control.   -Per GI.   2.  Esophageal stenosis/acute on chronic nausea/vomiting -Secondary to problem #1. -Nausea and vomiting improved.  Patient noted to have been tolerating tube feeds which was at goal on 02/20/2023 prior to procedure this morning.   -Nasogastric tube removed during procedure and as such we will discontinue tube feeds for now.   -Patient to be started on full liquid diet. -Continue IV antiemetics of Zofran as needed, Phenergan as needed, IV Compazine as needed if Zofran and Phenergan not helping with nausea and emesis. -See #1.  3.  Well-controlled diabetes mellitus type 2 -Hemoglobin A1c 5.8 (11/19/2022) -CBG of 156 this morning.   -Patient just returned from procedure and being started on full liquid diet.  -Patient on TPN and also noted to be on tube feeds at goal as of 02/20/2023. -Tube feeds discontinued postprocedure this morning..  -Insulin being adjusted with TPN per pharmacy. -SSI.  4.  Chronic A-fib -Rate controlled.  -Patient not an anticoagulation candidate.   -Aspirin on hold.   -GI to advise when aspirin may be resumed.  5.  History of CAD -Currently asymptomatic. -Aspirin on hold.  6.  Chronic normocytic anemia -Hemoglobin stable at 10.6.   7.  8 mm nodule, right posterior bladder wall -Outpatient follow-up with urology.  8.  Leukocytosis -Likely reactive leukocytosis secondary to problem #1. -Afebrile.  -Patient with no urinary symptoms, no respiratory symptoms. -Patient status post EUS 02/16/2023.   -Leukocytosis improving and fluctuating currently at 11.6.[name] -Continue to monitor off antibiotics.    9.  Hypokalemia -Repleted.   -Potassium of 4.3. -Patient on TPN per pharmacy.     DVT prophylaxis: Heparin Code  Status: Full Family Communication: Updated patient, and wife at bedside.  Disposition: TBD  Status is: Inpatient Remains inpatient appropriate because: Severity of illness   Consultants:  Gastroenterology: Dr. Leone Payor 02/12/2023 EUS: Per Dr. Meridee Score 02/16/2023  Procedures:  CT abdomen and pelvis 02/12/2023 Abdominal films 02/13/2023 NG tube placement under fluoroscopy attempt 02/14/2023.  Dr. Ova Freshwater, IR PICC line  Antimicrobials:  Anti-infectives (From admission, onward)    None         Subjective: Patient lying in bed.  States he feels well.  Just returned from [facility] where he had a GI procedure done this morning.  Denies any nausea or vomiting.  Denies any worsening abdominal pain.  No chest pain.  No shortness of breath.  Wife at bedside.  .  Objective: Vitals:   02/20/23 1952 02/21/23 0412 02/21/23 0433 02/21/23 1238  BP: (!) 108/58 119/64  117/64  Pulse:  91  89  Resp: 16 15  16   Temp: 98.7 F (37.1 C) 98.5 F (36.9 C)  98.2 F (36.8 C)  TempSrc:    Oral  SpO2: 99% 96%  97%  Weight:   65.4 kg   Height:        Intake/Output Summary (Last 24 hours) at 02/21/2023 1454 Last data filed at 02/20/2023 2000 Gross per 24 hour  Intake 785.08 ml  Output --  Net 785.08 ml   Filed Weights   02/19/23 0754 02/20/23 0326 02/21/23 0433  Weight: 65.5 kg 65.7 kg 65.4 kg    Examination:  General exam: NAD.  Respiratory system: Lungs clear to auscultation bilaterally.  No wheezes, no crackles, no  rhonchi.  Fair air movement.  Speaking in full sentences. Cardiovascular system: Regular rate rhythm no murmurs rubs or gallops.  No JVD.  No left pitting lower extremity edema.   Gastrointestinal system: Abdomen is soft, nondistended, tenderness to palpation right upper quadrant.  Decreased tenderness to palpation epigastrium.  Positive bowel sounds.  No rebound.  No guarding.  Central nervous system: Alert and oriented. No focal neurological deficits. Extremities: Symmetric 5 x  5 power. Skin: No rashes, lesions or ulcers Psychiatry: Judgement and insight appear normal. Mood & affect appropriate.     Data Reviewed: I have personally reviewed following labs and imaging studies  CBC: Recent Labs  Lab 02/17/23 0354 02/18/23 0354 02/19/23 0356 02/20/23 0207 02/21/23 0405  WBC 12.5* 11.4* 10.0 11.4* 11.6*  HGB 9.9* 9.7* 9.7* 10.0* 10.6*  HCT 30.3* 31.3* 30.0* 31.1* 33.7*  MCV 85.1 90.7 88.0 87.9 86.9  PLT 339 317 302 320 307    Basic Metabolic Panel: Recent Labs  Lab 02/17/23 0354 02/18/23 0959 02/19/23 0356 02/20/23 1146 02/21/23 0405  NA 138 136 134* 136 135  K 3.5 3.9 4.1 4.5 4.3  CL 102 101 100 101 102  CO2 27 25 25 25 24   GLUCOSE 161* 181* 234* 269* 141*  BUN 17 21 22  24* 21  CREATININE 0.76 0.66 0.74 0.66 0.67  CALCIUM 8.1* 8.0* 8.1* 8.2* 8.1*  MG 2.1 1.9 1.9 2.1 2.3  PHOS 3.3 3.3 3.4 2.5 3.1    GFR: Estimated Creatinine Clearance: 64.7 mL/min (by C-G formula based on SCr of 0.67 mg/dL).  Liver Function Tests: Recent Labs  Lab 02/16/23 0353 02/17/23 0354 02/18/23 0959 02/21/23 0405  AST 58* 51* 34 27  ALT 78* 81* 69* 44  ALKPHOS 66 52 56 65  BILITOT 1.1 0.6 0.6 0.5  PROT 6.4* 6.0* 5.7* 6.4*  ALBUMIN 2.5* 2.2* 2.2* 2.5*    CBG: Recent Labs  Lab 02/20/23 1950 02/21/23 0119 02/21/23 0354 02/21/23 0603 02/21/23 1238  GLUCAP 201* 117* 143* 156* 162*     No results found for this or any previous visit (from the past 240 hour(s)).       Radiology Studies: No results found.      Scheduled Meds:  bisacodyl  10 mg Rectal Once   bisacodyl  10 mg Rectal Once   Chlorhexidine Gluconate Cloth  6 each Topical Daily   docusate  50 mg Per Tube BID   insulin aspart  0-15 Units Subcutaneous Q6H   pantoprazole (PROTONIX) IV  40 mg Intravenous Q12H   sennosides  5 mL Per Tube BID   sodium chloride flush  10-40 mL Intracatheter Q12H   sodium chloride flush  3 mL Intravenous Q12H   Continuous Infusions:  sodium  chloride     promethazine (PHENERGAN) injection (IM or IVPB) 12.5 mg (02/16/23 0154)   TPN ADULT (ION) 40 mL/hr at 02/20/23 1823   TPN ADULT (ION)       LOS: 9 days    Time spent: 40 minutes    Ramiro Harvest, MD [facility]   To contact the attending provider between 7A-7P or the covering provider during after hours 7P-7A, please log into the web site [email or web address] and access using universal [place] password for that web site. If you do not have the password, please call the hospital operator.  02/21/2023, 2:54 PM

## 2023-02-21 NOTE — Progress Notes (Signed)
IV team to bedside to hang new TPN infusion. Found previous TPN infusion to be off. Pt stated TPN had been off since last night. Educated Engineer, building services, pt & pt family on TPN policy when disconnected early.

## 2023-02-22 ENCOUNTER — Ambulatory Visit: Payer: HMO | Admitting: Gastroenterology

## 2023-02-22 DIAGNOSIS — I10 Essential (primary) hypertension: Secondary | ICD-10-CM | POA: Diagnosis not present

## 2023-02-22 DIAGNOSIS — K859 Acute pancreatitis without necrosis or infection, unspecified: Secondary | ICD-10-CM | POA: Diagnosis not present

## 2023-02-22 DIAGNOSIS — R112 Nausea with vomiting, unspecified: Secondary | ICD-10-CM | POA: Diagnosis not present

## 2023-02-22 DIAGNOSIS — Z4659 Encounter for fitting and adjustment of other gastrointestinal appliance and device: Secondary | ICD-10-CM | POA: Diagnosis not present

## 2023-02-22 DIAGNOSIS — K851 Biliary acute pancreatitis without necrosis or infection: Secondary | ICD-10-CM | POA: Diagnosis not present

## 2023-02-22 LAB — PHOSPHORUS: Phosphorus: 3.2 mg/dL (ref 2.5–4.6)

## 2023-02-22 LAB — CBC
HCT: 32.3 % — ABNORMAL LOW (ref 39.0–52.0)
Hemoglobin: 10.2 g/dL — ABNORMAL LOW (ref 13.0–17.0)
MCH: 27.5 pg (ref 26.0–34.0)
MCHC: 31.6 g/dL (ref 30.0–36.0)
MCV: 87.1 fL (ref 80.0–100.0)
Platelets: 288 K/uL (ref 150–400)
RBC: 3.71 MIL/uL — ABNORMAL LOW (ref 4.22–5.81)
RDW: 16.3 % — ABNORMAL HIGH (ref 11.5–15.5)
WBC: 10.9 K/uL — ABNORMAL HIGH (ref 4.0–10.5)
nRBC: 0 % (ref 0.0–0.2)

## 2023-02-22 LAB — MAGNESIUM: Magnesium: 2.2 mg/dL (ref 1.7–2.4)

## 2023-02-22 LAB — COMPREHENSIVE METABOLIC PANEL WITH GFR
ALT: 44 U/L (ref 0–44)
AST: 29 U/L (ref 15–41)
Albumin: 2.5 g/dL — ABNORMAL LOW (ref 3.5–5.0)
Alkaline Phosphatase: 58 U/L (ref 38–126)
Anion gap: 11 (ref 5–15)
BUN: 21 mg/dL (ref 8–23)
CO2: 23 mmol/L (ref 22–32)
Calcium: 8.1 mg/dL — ABNORMAL LOW (ref 8.9–10.3)
Chloride: 99 mmol/L (ref 98–111)
Creatinine, Ser: 0.69 mg/dL (ref 0.61–1.24)
GFR, Estimated: >60 mL/min
Glucose, Bld: 196 mg/dL — ABNORMAL HIGH (ref 70–99)
Potassium: 4.2 mmol/L (ref 3.5–5.1)
Sodium: 133 mmol/L — ABNORMAL LOW (ref 135–145)
Total Bilirubin: 0.5 mg/dL (ref 0.3–1.2)
Total Protein: 6.4 g/dL — ABNORMAL LOW (ref 6.5–8.1)

## 2023-02-22 LAB — GLUCOSE, CAPILLARY
Glucose-Capillary: 150 mg/dL — ABNORMAL HIGH (ref 70–99)
Glucose-Capillary: 152 mg/dL — ABNORMAL HIGH (ref 70–99)
Glucose-Capillary: 203 mg/dL — ABNORMAL HIGH (ref 70–99)
Glucose-Capillary: 220 mg/dL — ABNORMAL HIGH (ref 70–99)
Glucose-Capillary: 253 mg/dL — ABNORMAL HIGH (ref 70–99)

## 2023-02-22 MED ORDER — PANCRELIPASE (LIP-PROT-AMYL) 12000-38000 UNITS PO CPEP
24000.0000 [IU] | ORAL_CAPSULE | Freq: Three times a day (TID) | ORAL | Status: DC
  Administered 2023-02-23 – 2023-02-26 (×11): 24000 [IU] via ORAL
  Filled 2023-02-22 (×12): qty 2

## 2023-02-22 MED ORDER — BOOST / RESOURCE BREEZE PO LIQD CUSTOM
1.0000 | Freq: Two times a day (BID) | ORAL | Status: DC
  Administered 2023-02-23: 1 via ORAL

## 2023-02-22 MED ORDER — TRAVASOL 10 % IV SOLN
INTRAVENOUS | Status: AC
  Filled 2023-02-22: qty 1101.6

## 2023-02-22 MED ORDER — BISACODYL 10 MG RE SUPP
10.0000 mg | Freq: Once | RECTAL | Status: AC
  Administered 2023-02-22: 10 mg via RECTAL
  Filled 2023-02-22: qty 1

## 2023-02-22 MED ORDER — BISACODYL 10 MG RE SUPP: 10.0000 mg | Freq: Every day | RECTAL | Status: DC | PRN

## 2023-02-22 MED ORDER — INSULIN ASPART 100 UNIT/ML IJ SOLN
0.0000 [IU] | Freq: Three times a day (TID) | INTRAMUSCULAR | Status: DC
  Administered 2023-02-22: 3 [IU] via SUBCUTANEOUS
  Administered 2023-02-22: 5 [IU] via SUBCUTANEOUS
  Administered 2023-02-23 – 2023-02-24 (×5): 3 [IU] via SUBCUTANEOUS
  Administered 2023-02-24: 5 [IU] via SUBCUTANEOUS
  Administered 2023-02-25: 2 [IU] via SUBCUTANEOUS
  Administered 2023-02-25 (×2): 3 [IU] via SUBCUTANEOUS
  Administered 2023-02-26: 2 [IU] via SUBCUTANEOUS

## 2023-02-22 MED ORDER — INSULIN ASPART 100 UNIT/ML IJ SOLN: 0.0000 [IU] | Freq: Every day | INTRAMUSCULAR | Status: DC

## 2023-02-22 NOTE — Progress Notes (Addendum)
Progress Note   Subjective  Chief Complaint:Gallstone pancreatitis  Status post upper GI Endo with endoscopic ultrasound status post ultrasound-guided gastrojejunostomy with Dr. Edyth Gunnels yesterday.  Per patient tolerated some soft foods last night, this morning that was eating some applesauce and apparently this came right back up after he swallowed it along with a Colace pill.  Per his wife he is not sitting up straight and afterwards tolerated some bites of pudding and other things as long as he was sitting up in the correct position.  He has been making laps down the hall.  Does have some epigastric abdominal discomfort after procedure yesterday.  Otherwise no acute changes.   Objective   Vital signs in last 24 hours: Temp:  [97.7 F (36.5 C)-98.4 F (36.9 C)] 98.1 F (36.7 C) (08/20 0757) Pulse Rate:  [77-94] 86 (08/20 0757) Resp:  [16-18] 18 (08/20 0757) BP: (112-137)/(56-79) 120/56 (08/20 0757) SpO2:  [97 %-99 %] 97 % (08/20 0757) Weight:  [68 kg] 68 kg (08/20 0552) Last BM Date : 02/11/23 General:    white male in NAD Heart:  Regular rate and rhythm; no murmurs Lungs: Respirations even and unlabored, lungs CTA bilaterally Abdomen:  Soft, mild epigastric TTP and nondistended. Normal bowel sounds. Psych:  Cooperative. Normal mood and affect.  Intake/Output from previous day: 08/19 0701 - 08/20 0700 In: 1590 [P.O.:420; I.V.:1020; IV Piggyback:150] Out: 525 [Urine:525]   Lab Results: Recent Labs    02/20/23 0207 02/21/23 0405 02/22/23 0340  WBC 11.4* 11.6* 10.9*  HGB 10.0* 10.6* 10.2*  HCT 31.1* 33.7* 32.3*  PLT 320 307 288   BMET Recent Labs    02/20/23 1146 02/21/23 0405 02/22/23 0340  NA 136 135 133*  K 4.5 4.3 4.2  CL 101 102 99  CO2 25 24 23   GLUCOSE 269* 141* 196*  BUN 24* 21 21  CREATININE 0.66 0.67 0.69  CALCIUM 8.2* 8.1* 8.1*   LFT Recent Labs    02/22/23 0340  PROT 6.4*  ALBUMIN 2.5*  AST 29  ALT 44  ALKPHOS 58  BILITOT 0.5      Assessment / Plan:   Assessment: 1.  Severe gallstone pancreatitis: Requiring ERCP and sphincterotomy with stone removal in May 2024, pancreatitis complicated by fluid collection/pseudocyst with ongoing weight loss and gastroduodenal outlet obstruction, status post gastrojejunostomy on 02/21/2023 at [facility] with Dr. Jamse Mead, minimal epigastric discomfort today, mostly tolerating his clear liquid diet  Plan: 1.  Continue clear liquids for lunch, then can advance to soft diet this evening as long as he does well 2.  Continue other supportive measures  Thank you for your kind consultation.   LOS: 10 days   Unk Lightning  02/22/2023, 12:31 PM   I have taken an interval history, thoroughly reviewed the chart and examined the patient. I agree with the Advanced Practitioner's note, impression and recommendations, and have recorded additional findings, impressions and recommendations below. I performed a substantive portion of this encounter (>50% time spent), including a complete performance of the medical decision making.  My additional thoughts are as follows:  He is able to keep down a little bit of full liquids and protein calorie supplement, but had an episode of vomiting earlier today so he is taking things slowly.  I spoke with Dr. Edyth Gunnels after yesterday's procedure and reviewed the entire procedure report.  I explained to [place] and his wife that although there is now a gastrojejunostomy, he most likely has delayed gastric emptying from the  months of gastric outlet obstruction, and I will take some time for that to slowly improve.  Therefore, he may be better some days than others in terms of how much food can be kept down.  He will need ongoing liquid protein calorie supplements at home 4 weeks or perhaps up to a couple of months afterward.  He is severely malnourished and clearly has to gain some weight to get his strength back in completely heal.  He does not seem quite ready to be  discharge from the hospital yet, but can be when he is keeping down enough nutrition to maintain his caloric requirements in the TPN can then be discontinued.  We will see him again during this hospital stay if called, otherwise I will plan to help outpatient follow-up with our office.  Dr. Edyth Gunnels is also planning his imaging and clinic follow-up with this patient.  Much thanks to the medicine service for outstanding care as always.   Charlie Pitter III Office:[phone]

## 2023-02-22 NOTE — Plan of Care (Signed)
  Problem: Health Behavior/Discharge Planning: Goal: Ability to manage health-related needs will improve Outcome: Progressing   

## 2023-02-22 NOTE — Progress Notes (Signed)
PHARMACY - TOTAL PARENTERAL NUTRITION CONSULT NOTE  Indication: Gallstone pancreatitis with GOO  Patient Measurements: Height: 5\' 8"  (172.7 cm) Weight: 68 kg (149 lb 14.6 oz) IBW/kg (Calculated) : 68.4 TPN AdjBW (KG): 66.7 Body mass index is 22.79 kg/m.  Assessment:  17 YOM presented on 8/9 with NGT displacement.  Patient/family reports admitted in May for 21 days for severe pancreatitis.  He was discharged on an oral diet and did not eat well.  He was readmitted in July, from which he was discharged on tube feeding.  He was on TF for 3-4 days before he vomited and the NGT was dislodged.  Patient/family reports losing ~40 lbs since May and his intake has been poor since. RD and IR attempted post-pyloric tube placement and were unsuccessful.  Pharmacy consulted to manage TPN for pancreatitic pseudocyst and GOO.  Planning transfer to [facility] on 8/19 for cystogastrostomy versus endoscopic gastrojejunostomy. Will stop TF at midnight prior to procedure and return to [facility] after procedure.  Glucose / Insulin: DM2 on Basaglar 10/d PTA, A1C 5.8.  CBGs 162-253, used 16 units SSI/ 24hr + 25 units in TPN Electrolytes: Na 133 (max in TPN), CoCa 9.3,  others WNL  Renal: SCr  0.6, BUN WNL Hepatic: LFTs/TG WNL, albumin 2.2 Intake / Output; MIVF:  UOP x5 charted, LBM 8/17, Net + 6.8L GI Imaging:  8/10 CT: increase in pancreatic pseudocyst size, severe sigmoid diverticulosis, no bowel obstruction  8/11 KUB: nonobstructive bowel gas pattern, mild colonic stool 8/14 KUB: contrast in colon, minimal SB gas  GI Surgeries / Procedures: 8/12 Naso G Tube placement - unable to advance into duodenum d/t anatomy  8/14 upper EUS: LA grade C esophagitis without bleeding, pancreatic parenchymal abnormalities, cystic lesion in pancreatic head, feeding tube placed 8/19- to [facility] for endoscopic diverting gastrojejunostomy, walled off necrosis in peripancreatic region  Central access: PICC placed on 02/15/23 TPN start date:  02/15/23  Nutritional Goals:  RD Estimated Needs Total Energy Estimated Needs: 2100-2300 kcal/d Total Protein Estimated Needs: 105-125 g/d Total Fluid Estimated Needs: 2.1-2.3L/d  Current Nutrition:  TPN  8/17 09:30 TF 20 ml/hr > advance 10 cc q4 hours to goal 65 ml/hr- per GI- stopped at 2300 for GI procedure 8/19 8/18 TF 65 ml/hr- tolerating  8/19 NPO (surgery [facility]) > FLD- 1 apple sauce, half a pudding, 1.5 juices. Thought could start stent diet already per [facility] (supposed to start 8/20) so ate outside food - ~4 bites each of mashed potatoes, chicken, green beans, stewed apples and 2 bites of banana pudding 8/20 FLD - will order breakfast but feeing a deep, aching abdominal pain that is new today   Plan:  Continue TPN at goal 85 mL/hr at 1800, provides 110g AA, 2113 kcal, meeting 100% estimated needs  Electrolytes in TPN: Na 150 mEq/L, K 40 mEq/L, Ca 5 mEq/L, Mg 10 mEq/L, Phos 15 mmol/L. Cl:Ac 1:1   Add standard MVI and trace elements to TPN Change moderate SSI to TID with meals and add bedtime SSI Increase to 28 units of regular insulin to TPN  Standard TPN labs on Mon/Thurs and PRN F/u TF advancement after procedure 8/19 and wean TPN as able   Alphia Moh, PharmD, BCPS, BCCP Clinical Pharmacist  Please check AMION for all [facility] Pharmacy phone numbers After 10:00 PM, call Main Pharmacy [phone]

## 2023-02-22 NOTE — Plan of Care (Signed)
Patient alert/oriented X4. Patient compliant with medication administration and properly covered with insulin. Patient tolerated TPN and full liquids. Patient was up in chair for a few hours and ambulated around the halls with assistance. Patient has no complaints at this time. VSS.  Problem: Education: Goal: Knowledge of General Education information will improve Description: Including pain rating scale, medication(s)/side effects and non-pharmacologic comfort measures Outcome: Progressing   Problem: Health Behavior/Discharge Planning: Goal: Ability to manage health-related needs will improve Outcome: Progressing   Problem: Clinical Measurements: Goal: Ability to maintain clinical measurements within normal limits will improve Outcome: Progressing   Problem: Clinical Measurements: Goal: Will remain free from infection Outcome: Progressing   Problem: Clinical Measurements: Goal: Diagnostic test results will improve Outcome: Progressing   Problem: Clinical Measurements: Goal: Cardiovascular complication will be avoided Outcome: Progressing   Problem: Coping: Goal: Level of anxiety will decrease Outcome: Progressing   Problem: Elimination: Goal: Will not experience complications related to bowel motility Outcome: Progressing   Problem: Elimination: Goal: Will not experience complications related to urinary retention Outcome: Progressing   Problem: Pain Managment: Goal: General experience of comfort will improve Outcome: Progressing   Problem: Safety: Goal: Ability to remain free from injury will improve Outcome: Progressing   Problem: Skin Integrity: Goal: Risk for impaired skin integrity will decrease Outcome: Progressing

## 2023-02-22 NOTE — Progress Notes (Signed)
Nutrition Follow-up  DOCUMENTATION CODES:   Not applicable  INTERVENTION:  Continue TPN to meet 100% of estimated needs until pt able to consistently consume >65% of need Pharmacy to manage Boost Breeze po BID, each supplement provides 250 kcal and 9 grams of protein Advance diet per GI team.   NUTRITION DIAGNOSIS:   Inadequate oral intake related to nausea, vomiting as evidenced by per patient/family report. - Being met via TPN  GOAL:   Patient will meet greater than or equal to 90% of their needs - Met via TPN   MONITOR:   PO intake, Supplement acceptance, Diet advancement, Weight trends, Labs  REASON FOR ASSESSMENT:   Consult Enteral/tube feeding initiation and management  ASSESSMENT:   Pt with hx of CAD, CKD, DM type 2, GERD, HLD, PAF, hx STEMI, and diverticulosis presented to ED with a dislodged NJ tube. Pt with recent admission related to severe pancreatitis and was discharged with NJ tube in place for enteral feeds.  8/09 - Admitted 8/13 - TPN initiated 8/14 - Op, upper EUS; NGT placed (tip third portion duodenum) 8/15 - Trickle Tube feeds started 8/19 - Op at [facility], EUS gastrojejunostomy; NGT removed; diet advanced to full liquids   Met with pt and wife in room. Was able to tolerate some soft foods last night, had not had breakfast yet this morning. Happy to have tube out. Reviewed diet handout from [facility], will be on soft foods once able to tolerate liquids. Discussed using oral nutrition supplements to help meet nutritional needs, pt agreeable to try Boost Breeze again until able to get preferred shakes from home.   Meal Intake  8/19-8/20: 50% x 2 meals  Medications reviewed and include: Dulcolax, Colace, NovoLog SSI, Creon, Protonix, Senokot  Labs reviewed: Sodium 133, Potassium 4.2, BUN 21, Creatinine 0.69, Phosphorus 3.2, Magnesium 2.2 CBG: 162-253 x 24 hrs   Diet Order:   Diet Order             Diet full liquid Fluid consistency: Thin  Diet effective now                    EDUCATION NEEDS:   Education needs have been addressed  Skin:  Skin Assessment: Reviewed RN Assessment  Last BM:  8/17  Height:  Ht Readings from Last 1 Encounters:  02/11/23 5\' 8"  (1.727 m)   Weight:  Wt Readings from Last 1 Encounters:  02/22/23 68 kg   Ideal Body Weight:  70 kg  BMI:  Body mass index is 22.79 kg/m.  Estimated Nutritional Needs:  Kcal:  2100-2300 kcal/d Protein:  105-125 g/d Fluid:  2.1-2.3L/d   Kirby Crigler RD, LDN Clinical Dietitian See [facility] for contact information.

## 2023-02-22 NOTE — Progress Notes (Signed)
Pt's urine output green 525 ml this morning. Spouse mentioned urine turning green after GI procedure. Dr. Janee Morn notified. No new orders at this time.

## 2023-02-22 NOTE — Progress Notes (Signed)
PROGRESS NOTE    Jon Bray  ZOX:[id number] DOB: [birth year] DOA: 02/11/2023 PCP: Danella Penton, MD    Chief Complaint  Patient presents with   NG Tube Displacement    Brief Narrative:  82 y.o. male  with medical history significant of esophageal stenosis s/p Cortrack NG-tube placed on 02/02/2023, insulin-dependent DM type II, essential hypertension, CAD, paroxysmal atrial fibrillation-not on anticoagulation, gallstone pancreatitis status post cholecystectomy May 2024, chronic anemia, chronic hyponatremia and moderate malnutrition presented to emergency department due to vomiting and displaced NG tube.  Patient had recent hospital admission 01/28/2023 to 02/08/2023 due to AKI, dehydration, acute pancreatitis. Patient underwent EGD 7/29 showed esophageal stenosis, hiatal hernia and ulcerated gastric fundus with congested mucosa. As patient did not tolerated oral diet well feeding tube Cortrack placed on 7/31 and to continue liquid diet as tolerated. Patient was discharged home with [facility] and supposed to be follow-up with GI on 02/22/2023.  CT scan 8/10 shows inflammatory changes with stranding of the fat surrounding had an aggressive process and peripancreatic fluid collection.  [facility] GI team has been consulted.  Patient underwent core track tube placement however unable to be advanced and tried under fluoroscopy which was unsuccessful. -Patient being followed by GI and patient s/p EUS, 02/16/2023 for either drainage of pancreatic pseudocyst versus pancreatic cyst gastrostomy. -Patient was seen by Dr. Edyth Gunnels at [facility], 02/21/2023 and patient underwent diverting gastrojejunostomy.  Patient on TPN, patient started on full liquids.   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  -Nasoenteric tube was attempted however unable to get to postpyloric and as such GI removed tube at bedside 02/15/2023. -Patient being followed by GI and patient s/p EUS, 02/16/2023, for either drainage of pancreatic pseudocyst via transgastric approach versus pancreatic cyst gastrostomy with GI. -Patient underwent EUS 02/16/2023, however due to anatomy and multicystic nature of pseudocyst and distance from gastric wall GI unable to perform cystogastrostomy.  -GI felt patient will require double-pigtail pseudocyst gastrostomy.  -Nasoenteric tube was placed in the postpyloric region and endoscopically confirmed on EUS on 02/16/2023, and patient started on trickle feeds per GI on 02/17/2023.  Trickle feeds were advanced at 20 cc/h and patient seemed to be tolerating.  -GI, Dr. Rhea Belton has discussed with Dr. Edyth Gunnels of [facility] and was noted will be able to accommodate cystogastrostomy versus endoscopic gastrojejunostomy, Monday, 02/21/2023 which was a day trip at [facility].  -Patient underwent procedures on 02/21/2023, at [facility] per GI,.which showed a walled off necrosis in the peripancreatic region, EUS guided gastrojejunostomy was done. -GI postprocedure recommended a full liquid diet and advance to a stent diet. -GI at [facility], Dr. Jamse Mead recommended CT abdomen and pelvis in 1 month with follow-up with GI at [facility] in 1 to 2 months after imaging study. -Patient currently on a full liquid diet, patient with epigastric abdominal pain, had a  bout of emesis this morning, 02/22/2023 and elects to remain on the full liquid diet at this time. -Patient started Creon and will increase the dose to 36,000 units 3 times daily with meals. -If patient able to tolerate full liquid diet today may consider advancing diet to a soft diet tomorrow 02/23/2023. -Continue antiemetics, pain control.   -Per GI.   2.  Esophageal stenosis/acute on chronic nausea/vomiting -Secondary to problem #1. -Nausea and vomiting improved.  Patient noted to have been tolerating tube feeds which was at goal on 02/20/2023 prior to procedure on 02/21/2023..   -Nasogastric tube removed during procedure and as such tube feeds were discontinued 02/21/2023.   -Patient noted to have tolerated full liquid diet yesterday, had a bout of emesis this morning after taking laxatives with some epigastric abdominal pain and prefers to stay on full liquid diet for the rest of the day.  -Continue IV antiemetics of Zofran as needed, Phenergan as needed, IV Compazine as needed if Zofran and Phenergan not helping with nausea and emesis. -See #1.  3.  Well-controlled diabetes mellitus type 2 -Hemoglobin A1c 5.8 (11/19/2022) -CBG of 203 this morning.   -Patient has been started on a full liquid diet. -Patient on TPN and insulin being managed per pharmacy. -SSI.  4.  Chronic A-fib -Rate controlled.  -Patient not an anticoagulation candidate.   -Aspirin currently on hold.   -GI to advise when aspirin may be resumed.   5.  History of CAD -Currently asymptomatic. -Aspirin on hold.  6.  Chronic normocytic anemia -Hemoglobin stable at 10.2.   7.  8 mm nodule, right posterior bladder wall -Outpatient follow-up with urology.  8.  Leukocytosis -Likely reactive leukocytosis secondary to problem #1. -Afebrile.  -Patient with no urinary symptoms, no respiratory symptoms. -Patient status post EUS 02/16/2023.   -Leukocytosis trending down currently at 10.9 today.   -Continue to monitor off  antibiotics.    9.  Hypokalemia -Repleted.   -Potassium of 4.2 today.   -Patient on TPN per pharmacy.     DVT prophylaxis: Heparin Code Status: Full Family Communication: Updated patient, no family at bedside.  Disposition: TBD  Status is: Inpatient Remains inpatient appropriate because: Severity of illness   Consultants:  Gastroenterology: Dr. Leone Payor 02/12/2023 EUS: Per Dr. Meridee Score 02/16/2023  Procedures:  CT abdomen and pelvis 02/12/2023 Abdominal films 02/13/2023 NG tube placement under fluoroscopy attempt 02/14/2023.  Dr. Ova Freshwater, IR PICC line  Antimicrobials:  Anti-infectives (From admission, onward)    None         Subjective: Patient laying in bed.  States has not had a bowel movement for 5 days.  Some complaints of epigastric abdominal pain.  Noted to have tolerated full liquid diet yesterday.  Stated tried full liquid diet this morning and after taking stool softener had a bout of emesis.  Wants to remain on full liquid diet for the rest of the day.  Denies any chest pain or shortness of breath.    Objective: Vitals:   02/21/23 1529 02/21/23 1913 02/22/23 0552 02/22/23 0757  BP: 122/78 112/69 137/79 (!) 120/56  Pulse: 77 86 94 86  Resp: 16 18 18 18   Temp: 97.7 F (36.5 C) 98.4 F (36.9 C) 98 F (36.7 C) 98.1 F (36.7 C)  TempSrc: Oral Oral Oral   SpO2: 99% 98% 99% 97%  Weight:   68 kg   Height:        Intake/Output Summary (Last 24 hours) at 02/22/2023 1313 Last data filed at  02/22/2023 0636 Gross per 24 hour  Intake 1590 ml  Output 525 ml  Net 1065 ml   Filed Weights   02/20/23 0326 02/21/23 0433 02/22/23 0552  Weight: 65.7 kg 65.4 kg 68 kg    Examination:  General exam: NAD.  Respiratory system: CTAB anterior lung fields.  No wheezes, no crackles, no rhonchi.  Fair air movement.  Speaking in full sentences.   Cardiovascular system: RRR no murmurs rubs or gallops.  No JVD.  No lower extremity edema.   Gastrointestinal system: Abdomen  is soft, tenderness to palpation epigastrium and right upper quadrant.  Positive bowel sounds.  No rebound.  No guarding.  Central nervous system: Alert and oriented. No focal neurological deficits. Extremities: Symmetric 5 x 5 power. Skin: No rashes, lesions or ulcers Psychiatry: Judgement and insight appear normal. Mood & affect appropriate.     Data Reviewed: I have personally reviewed following labs and imaging studies  CBC: Recent Labs  Lab 02/18/23 0354 02/19/23 0356 02/20/23 0207 02/21/23 0405 02/22/23 0340  WBC 11.4* 10.0 11.4* 11.6* 10.9*  HGB 9.7* 9.7* 10.0* 10.6* 10.2*  HCT 31.3* 30.0* 31.1* 33.7* 32.3*  MCV 90.7 88.0 87.9 86.9 87.1  PLT 317 302 320 307 288    Basic Metabolic Panel: Recent Labs  Lab 02/18/23 0959 02/19/23 0356 02/20/23 1146 02/21/23 0405 02/22/23 0340  NA 136 134* 136 135 133*  K 3.9 4.1 4.5 4.3 4.2  CL 101 100 101 102 99  CO2 25 25 25 24 23   GLUCOSE 181* 234* 269* 141* 196*  BUN 21 22 24* 21 21  CREATININE 0.66 0.74 0.66 0.67 0.69  CALCIUM 8.0* 8.1* 8.2* 8.1* 8.1*  MG 1.9 1.9 2.1 2.3 2.2  PHOS 3.3 3.4 2.5 3.1 3.2    GFR: Estimated Creatinine Clearance: 67.3 mL/min (by C-G formula based on SCr of 0.69 mg/dL).  Liver Function Tests: Recent Labs  Lab 02/16/23 0353 02/17/23 0354 02/18/23 0959 02/21/23 0405 02/22/23 0340  AST 58* 51* 34 27 29  ALT 78* 81* 69* 44 44  ALKPHOS 66 52 56 65 58  BILITOT 1.1 0.6 0.6 0.5 0.5  PROT 6.4* 6.0* 5.7* 6.4* 6.4*  ALBUMIN 2.5* 2.2* 2.2* 2.5* 2.5*    CBG: Recent Labs  Lab 02/21/23 1238 02/21/23 1816 02/21/23 2359 02/22/23 0550 02/22/23 1242  GLUCAP 162* 185* 253* 203* 220*     No results found for this or any previous visit (from the past 240 hour(s)).       Radiology Studies: No results found.      Scheduled Meds:  bisacodyl  10 mg Rectal Once   bisacodyl  10 mg Rectal Once   Chlorhexidine Gluconate Cloth  6 each Topical Daily   docusate  50 mg Oral BID   insulin  aspart  0-15 Units Subcutaneous TID WC   insulin aspart  0-5 Units Subcutaneous QHS   lipase/protease/amylase  12,000 Units Oral TID WC   pantoprazole (PROTONIX) IV  40 mg Intravenous Q12H   sennosides  5 mL Oral BID   sodium chloride flush  10-40 mL Intracatheter Q12H   sodium chloride flush  3 mL Intravenous Q12H   Continuous Infusions:  sodium chloride     promethazine (PHENERGAN) injection (IM or IVPB) 12.5 mg (02/16/23 0154)   TPN ADULT (ION) 85 mL/hr at 02/21/23 1731   TPN ADULT (ION)       LOS: 10 days    Time spent: 40 minutes    Ramiro Harvest, MD [facility]   To contact the attending provider between 7A-7P or the covering provider during after hours 7P-7A, please log into the web site [email or web address] and access using universal [place] password for that web site. If you do not have the password, please call the hospital operator.  02/22/2023, 1:13 PM

## 2023-02-23 DIAGNOSIS — Z4659 Encounter for fitting and adjustment of other gastrointestinal appliance and device: Secondary | ICD-10-CM | POA: Diagnosis not present

## 2023-02-23 LAB — CBC
HCT: 31.1 % — ABNORMAL LOW (ref 39.0–52.0)
Hemoglobin: 9.9 g/dL — ABNORMAL LOW (ref 13.0–17.0)
MCH: 28.3 pg (ref 26.0–34.0)
MCHC: 31.8 g/dL (ref 30.0–36.0)
MCV: 88.9 fL (ref 80.0–100.0)
Platelets: 234 10*3/uL (ref 150–400)
RBC: 3.5 MIL/uL — ABNORMAL LOW (ref 4.22–5.81)
RDW: 16.8 % — ABNORMAL HIGH (ref 11.5–15.5)
WBC: 10.9 10*3/uL — ABNORMAL HIGH (ref 4.0–10.5)
nRBC: 0 % (ref 0.0–0.2)

## 2023-02-23 LAB — BASIC METABOLIC PANEL
Anion gap: 9 (ref 5–15)
BUN: 20 mg/dL (ref 8–23)
CO2: 24 mmol/L (ref 22–32)
Calcium: 7.9 mg/dL — ABNORMAL LOW (ref 8.9–10.3)
Chloride: 99 mmol/L (ref 98–111)
Creatinine, Ser: 0.65 mg/dL (ref 0.61–1.24)
GFR, Estimated: >60 mL/min (ref >60)
Glucose, Bld: 170 mg/dL — ABNORMAL HIGH (ref 70–99)
Potassium: 4 mmol/L (ref 3.5–5.1)
Sodium: 132 mmol/L — ABNORMAL LOW (ref 135–145)

## 2023-02-23 LAB — GLUCOSE, CAPILLARY
Glucose-Capillary: 189 mg/dL — ABNORMAL HIGH (ref 70–99)
Glucose-Capillary: 173 mg/dL — ABNORMAL HIGH (ref 70–99)
Glucose-Capillary: 156 mg/dL — ABNORMAL HIGH (ref 70–99)
Glucose-Capillary: 155 mg/dL — ABNORMAL HIGH (ref 70–99)

## 2023-02-23 LAB — MAGNESIUM: Magnesium: 1.9 mg/dL (ref 1.7–2.4)

## 2023-02-23 MED ORDER — SENNA 8.6 MG PO TABS
1.0000 | ORAL_TABLET | Freq: Two times a day (BID) | ORAL | Status: DC
  Administered 2023-02-24 – 2023-02-26 (×4): 8.6 mg via ORAL
  Filled 2023-02-23 (×6): qty 1

## 2023-02-23 MED ORDER — TRAVASOL 10 % IV SOLN
INTRAVENOUS | Status: AC
  Filled 2023-02-23: qty 1101.6

## 2023-02-23 NOTE — Progress Notes (Signed)
Nutrition Brief Note    Met with pt walking in hall. Reports tolerated some liquids last night. Has been taking a few sips of drinks at a time. Pt willing to try [employer], RD to order on lunch tray. Current drinking MRE shake brought in from home (provides 230 kcal and 40 gm protein). MD came in while RD in room, discussed advancing diet to Soft to allow pt to have alternative options. Continue with TPN to meet 100% estimated needs.    Kirby Crigler RD, LDN Clinical Dietitian See [facility] for contact information.

## 2023-02-23 NOTE — Progress Notes (Signed)
PROGRESS NOTE  Jon Bray  WNU:[id number] DOB: [birth year] DOA: 02/11/2023 PCP: Danella Penton, MD   Brief Narrative: Patient is a 82 year old male with history of esophageal stenosis status post core track/NG tube placed on 02/02/2023, insulin-dependent diabetes type 2, hypertension, coronary artery disease, paroxysmal atrial fibrillation not on anticoagulation, gallstone pancreatitis status post cholecystectomy on May 2024, chronic anemia, hyponatremia, moderate malnutrition who presented with complaint of vomiting, displaced NG tube.  He was recently hospitalized here at the end of July for AKI, dehydration, acute pancreatitis.  EGD at that time showed esophageal stenosis, hyperlipidemia, ulcerated gastric fundus with congested mucosa.  Feeding tube was placed on 7/31 and to continue liquid diet.  Patient was discharged home at core track and supposed to follow-up with GI on 8/20.  On presentation CT imaging showed inflammatory changes with stranding of the fat surrounding, peripancreatic fluid collection.  GI consulted. Status post endoscopic ultrasound on 8/14.  Status post upper GI endoscopy with esophageal 7, status post ultrasound-guided gastrojejunostomy with Dr. Edyth Gunnels at [facility] on 8/19.  Plan is to wean the TPN before discharge.  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  On  last admission, he was discharged with feeding tube.  Feeding tube was dislodged.  Status post endoscopic ultrasound on 8/14.  NG tube replaced, started on trickle feeds,now removed.  Patient was transferred to [facility] and underwent endoscopic ultrasound-guided gastrojejunostomy by Dr. Edyth Gunnels.  Dr. Edyth Gunnels recommended CT abdomen/pelvis in a month and follow-up as an outpatient at [facility].  Currently on full liquid diet.  Has intermittent abdomen pain, vomiting.  Continue Creon.  Will continue to try to advance the diet.  Will wean off TPN only after  adequate intake.  History of esophageal stenosis/chronic nausea/vomiting: Has intermittent nausea and vomiting.  Currently on full liquid diet, TPN.  Will try to advance the diet to soft if he tolerates.  Continue antiemetics, pain control  Diabetes mellitus: Recent A1c of 5.2.  Continue to monitor blood sugars  Chronic A-fib: Rate is controlled.  Not a candidate for anticoagulation.  Aspirin currently on hold.  Aspirin will be resumed on discharge.  History of coronary artery disease: No anginal symptoms at present.  Takes aspirin  Chronic microcytic anemia: Current hemoglobin stable.  Right posterior bladder wall nodule: Noted to be 8 mm nodule on the right posterior bladder wall.  We recommend follow-up with urology  Leukocytosis: Likely reactive.  Monitor off antibiotics  Hypokalemia: Currently being monitored and supplemented as needed      Nutrition Problem: Inadequate oral intake Etiology: nausea, vomiting    DVT prophylaxis:SCDs Start: 02/12/23 0021     Code Status: Full Code  Family Communication: None at bedside  Patient status:Inpatient  Patient is from :Home  Anticipated discharge IH:KVQQ  Estimated DC date:2-3 days   Consultants: GI  Procedures:As above  Antimicrobials:  Anti-infectives (From admission, onward)    None  Subjective: Patient seen and examined at bedside today.  Hemodynamically stable.  He was  walking on the hallway with the dietitian.  Looks very comfortable .  Denies any nausea, abdomen pain or vomiting today.  He tolerated soft diet this morning.  He says he is not ready to wean the TPN.  Had a bowel movement today  Objective: Vitals:   02/22/23 1614 02/22/23 1940 02/23/23 0402 02/23/23 0758  BP: 117/75 126/69 127/68 103/64  Pulse: 82 84 92 100  Resp:  18 17   Temp: (!) 97.5 F (36.4 C) 98.8 F (37.1 C) 98.8 F (37.1 C) 98.6 F (37 C)  TempSrc:      SpO2: 98% 98% 97% 97%  Weight:      Height:        Intake/Output Summary (Last 24 hours) at 02/23/2023 1108 Last data filed at 02/22/2023 2200 Gross per 24 hour  Intake 382.03 ml  Output --  Net 382.03 ml   Filed Weights   02/20/23 0326 02/21/23 0433 02/22/23 0552  Weight: 65.7 kg 65.4 kg 68 kg    Examination:  General exam: Overall comfortable, not in distress, pleasant elderly male HEENT: PERRL Respiratory system:  no wheezes or crackles  Cardiovascular system: S1 & S2 heard, RRR.  Gastrointestinal system: Abdomen is nondistended, soft and nontender. Central nervous system: Alert and oriented Extremities: No edema, no clubbing ,no cyanosis Skin: No rashes, no ulcers,no icterus     Data Reviewed: I have personally reviewed following labs and imaging studies  CBC: Recent Labs  Lab 02/19/23 0356 02/20/23 0207 02/21/23 0405 02/22/23 0340 02/23/23 0317  WBC 10.0 11.4* 11.6* 10.9* 10.9*  HGB 9.7* 10.0* 10.6* 10.2* 9.9*  HCT 30.0* 31.1* 33.7* 32.3* 31.1*  MCV 88.0 87.9 86.9 87.1 88.9  PLT 302 320 307 288 234   Basic Metabolic Panel: Recent Labs  Lab 02/18/23 0959 02/19/23 0356 02/20/23 1146 02/21/23 0405 02/22/23 0340 02/23/23 0317  NA 136 134* 136 135 133* 132*  K 3.9 4.1 4.5 4.3 4.2 4.0  CL 101 100 101 102 99 99  CO2 25 25 25 24 23 24   GLUCOSE 181* 234* 269* 141* 196* 170*  BUN 21 22 24* 21 21 20   CREATININE 0.66 0.74 0.66 0.67 0.69 0.65  CALCIUM 8.0* 8.1* 8.2* 8.1* 8.1* 7.9*  MG 1.9 1.9  2.1 2.3 2.2 1.9  PHOS 3.3 3.4 2.5 3.1 3.2  --      No results found for this or any previous visit (from the past 240 hour(s)).   Radiology Studies: No results found.  Scheduled Meds:  Chlorhexidine Gluconate Cloth  6 each Topical Daily   docusate  50 mg Oral BID   feeding supplement  1 Container Oral BID BM   insulin aspart  0-15 Units Subcutaneous TID WC   insulin aspart  0-5 Units Subcutaneous QHS   lipase/protease/amylase  24,000 Units Oral TID WC   pantoprazole (PROTONIX) IV  40 mg Intravenous Q12H   senna  1 tablet Oral BID   sodium chloride flush  10-40 mL Intracatheter Q12H   sodium chloride flush  3 mL Intravenous Q12H   Continuous Infusions:  sodium chloride     promethazine (PHENERGAN) injection (IM or IVPB) 12.5 mg (02/16/23 0154)   TPN ADULT (ION) 85 mL/hr at 02/22/23 1743   TPN ADULT (ION)       LOS: 11 days   Burnadette Pop, MD [facility] P8/21/2024, 11:08 AM

## 2023-02-23 NOTE — Progress Notes (Signed)
PHARMACY - TOTAL PARENTERAL NUTRITION CONSULT NOTE  Indication: Gallstone pancreatitis with GOO  Patient Measurements: Height: 5\' 8"  (172.7 cm) Weight: 68 kg (149 lb 14.6 oz) IBW/kg (Calculated) : 68.4 TPN AdjBW (KG): 66.7 Body mass index is 22.79 kg/m.  Assessment:  Jon Bray presented on 8/9 with NGT displacement.  Patient/family reports admitted in May for 21 days for severe pancreatitis.  He was discharged on an oral diet and did not eat well.  He was readmitted in July, from which he was discharged on tube feeding.  He was on TF for 3-4 days before he vomited and the NGT was dislodged.  Patient/family reports losing ~40 lbs since May and his intake has been poor since. RD and IR attempted post-pyloric tube placement and were unsuccessful.  Pharmacy consulted to manage TPN for pancreatitic pseudocyst and GOO.  Planning transfer to [facility] on 8/19 for cystogastrostomy versus endoscopic gastrojejunostomy. Will stop TF at midnight prior to procedure and return to [facility] after procedure.  Glucose / Insulin: DM2 on Basaglar 10/d PTA, A1C 5.8.  CBGs 150-220, used 8 units SSI/ 24hr + Jon units in TPN Electrolytes: Na 132(max in TPN), CoCa 9.1, Mg 1.9  others WNL  Renal: SCr  0.65, BUN WNL Hepatic: LFTs/TG WNL, albumin 2.2 Intake / Output; MIVF:  UOP not charted, LBM 8/17, Net + 7.2L GI Imaging:  8/10 CT: increase in pancreatic pseudocyst size, severe sigmoid diverticulosis, no bowel obstruction  8/11 KUB: nonobstructive bowel gas pattern, mild colonic stool 8/14 KUB: contrast in colon, minimal SB gas  GI Surgeries / Procedures: 8/12 Naso G Tube placement - unable to advance into duodenum d/t anatomy  8/14 upper EUS: LA grade C esophagitis without bleeding, pancreatic parenchymal abnormalities, cystic lesion in pancreatic head, feeding tube placed 8/19- to [facility] for endoscopic diverting gastrojejunostomy, walled off necrosis in peripancreatic region  Central access: PICC placed on 02/15/23 TPN start  date: 02/15/23  Nutritional Goals:  RD Estimated Needs Total Energy Estimated Needs: 2100-2300 kcal/d Total Protein Estimated Needs: 105-125 g/d Total Fluid Estimated Needs: 2.1-2.3L/d  Current Nutrition:  TPN  8/17 09:30 TF 20 ml/hr > advance 10 cc q4 hours to goal 65 ml/hr- per GI- stopped at 2300 for GI procedure 8/19 8/18 TF 65 ml/hr- tolerating  8/19 NPO (surgery [facility]) > FLD- 1 apple sauce, half a pudding, 1.5 juices. Thought could start stent diet already per [facility] (supposed to start 8/20) so ate outside food - ~4 bites each of mashed potatoes, chicken, green beans, stewed apples and 2 bites of banana pudding 8/20 FLD, refused Boost 8/21 FLD + Boost ordered  Plan:  Continue TPN at goal 85 mL/hr at 1800, provides 110g AA, 2113 kcal, meeting 100% estimated needs  Electrolytes in TPN: increase Na 154 mEq/L (max), K 40 mEq/L, Ca 5 mEq/L, increase Mg 15 mEq/L (Max), Phos 15 mmol/L. Cl:Ac 1:1   Add standard MVI and trace elements to TPN Continue moderate SSI TID with meals and bedtime SSI Continue Jon units of regular insulin in TPN  Standard TPN labs 8/23 then on Mon/Thurs  F/u TF advancement after procedure 8/19 and wean TPN as able   Alphia Moh, PharmD, BCPS, BCCP Clinical Pharmacist  Please check AMION for all [facility] Pharmacy phone numbers After 10:00 PM, call Main Pharmacy [phone]

## 2023-02-23 NOTE — Plan of Care (Signed)
  Problem: Health Behavior/Discharge Planning: Goal: Ability to manage health-related needs will improve Outcome: Progressing   

## 2023-02-24 ENCOUNTER — Other Ambulatory Visit (HOSPITAL_COMMUNITY): Payer: Self-pay

## 2023-02-24 ENCOUNTER — Telehealth (HOSPITAL_COMMUNITY): Payer: Self-pay | Admitting: Pharmacy Technician

## 2023-02-24 DIAGNOSIS — Z4659 Encounter for fitting and adjustment of other gastrointestinal appliance and device: Secondary | ICD-10-CM | POA: Diagnosis not present

## 2023-02-24 LAB — GLUCOSE, CAPILLARY
Glucose-Capillary: 201 mg/dL — ABNORMAL HIGH (ref 70–99)
Glucose-Capillary: 157 mg/dL — ABNORMAL HIGH (ref 70–99)
Glucose-Capillary: 143 mg/dL — ABNORMAL HIGH (ref 70–99)
Glucose-Capillary: 144 mg/dL — ABNORMAL HIGH (ref 70–99)

## 2023-02-24 MED ORDER — TRAVASOL 10 % IV SOLN
INTRAVENOUS | Status: AC
  Filled 2023-02-24: qty 1101.6

## 2023-02-24 MED ORDER — SODIUM CHLORIDE (HYPERTONIC) 5 % OP SOLN
1.0000 [drp] | Freq: Every day | OPHTHALMIC | Status: DC
  Administered 2023-02-24 – 2023-02-25 (×2): 1 [drp] via OPHTHALMIC
  Filled 2023-02-24: qty 15

## 2023-02-24 MED ORDER — DRONABINOL 2.5 MG PO CAPS
2.5000 mg | ORAL_CAPSULE | Freq: Two times a day (BID) | ORAL | Status: DC
  Administered 2023-02-24 – 2023-02-26 (×4): 2.5 mg via ORAL
  Filled 2023-02-24 (×4): qty 1

## 2023-02-24 MED ORDER — ASPIRIN 81 MG PO CHEW
81.0000 mg | CHEWABLE_TABLET | Freq: Every day | ORAL | Status: DC
  Administered 2023-02-24 – 2023-02-26 (×3): 81 mg via ORAL
  Filled 2023-02-24 (×3): qty 1

## 2023-02-24 MED ORDER — GUAIFENESIN 100 MG/5ML PO LIQD
10.0000 mL | ORAL | Status: DC | PRN
  Administered 2023-02-25: 10 mL via ORAL
  Filled 2023-02-24: qty 15

## 2023-02-24 NOTE — Plan of Care (Signed)

## 2023-02-24 NOTE — TOC Benefit Eligibility Note (Signed)
Patient Product/process development scientist completed.    The patient is insured through [employer]/ [employer] Advance. Patient has Medicare and is not eligible for a copay card, but may be able to apply for patient assistance, if available.    Ran test claim for dronabinol (Marinol) 2.5 mg capsules and Requires Prior Authorization   This test claim was processed through [facility]- copay amounts may vary at other pharmacies due to [employer], or as the patient moves through the different stages of their insurance plan.     Roland Earl, CPHT Pharmacy Technician III Certified Patient Advocate [facility] Patient Advocate Team Direct Number: [phone]  Fax: [phone]

## 2023-02-24 NOTE — Progress Notes (Signed)
PROGRESS NOTE  Jon Bray  HQI:[id number] DOB: February 05, 1941 DOA: 02/11/2023 PCP: Danella Penton, MD   Brief Narrative: Patient is a 82 year old male with history of esophageal stenosis status post core track/NG tube placed on 02/02/2023, insulin-dependent diabetes type 2, hypertension, coronary artery disease, paroxysmal atrial fibrillation not on anticoagulation, gallstone pancreatitis status post cholecystectomy on May 2024, chronic anemia, hyponatremia, moderate malnutrition who presented with complaint of vomiting, displaced NG tube.  He was recently hospitalized here at the end of July for AKI, dehydration, acute pancreatitis.  EGD at that time showed esophageal stenosis, hyperlipidemia, ulcerated gastric fundus with congested mucosa.  Feeding tube was placed on 7/31 and to continue liquid diet.  Patient was discharged home with core track and supposed to follow-up with GI on 8/20.  On presentation CT imaging showed inflammatory changes with stranding of the fat surrounding, peripancreatic fluid collection.  GI consulted. Status post endoscopic ultrasound on 8/14.  Status post ultrasound-guided gastrojejunostomy with Dr. Edyth Gunnels at [facility] on 8/19.  Plan is to wean the TPN before discharge.  Tolerating soft diet .  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  On last admission, he was  discharged with feeding tube.  Feeding tube was dislodged.  Status post endoscopic ultrasound on 8/14.  NG tube replaced, started on trickle feeds,now removed.  Patient was transferred to [facility] and underwent endoscopic ultrasound-guided gastrojejunostomy by Dr. Edyth Gunnels.  Dr. Edyth Gunnels recommended CT abdomen/pelvis in a month and follow-up as an outpatient at [facility].  Continue Creon.  Currently on soft diet.  Will wean off TPN only after  adequate intake.  Will follow-up with nutritionist when we can discontinue TPN.  History of esophageal stenosis/chronic nausea/vomiting: Had intermittent nausea and vomiting.  Currently on soft diet, TPN.  Denies any abdomen pain  Diabetes mellitus: Recent A1c of 5.2.  Continue to monitor blood sugars  Chronic A-fib: Rate is controlled.  Not a candidate for anticoagulation.  On aspirin which will be continued  History of coronary artery disease: No anginal symptoms at present.  Takes aspirin  Chronic microcytic anemia: Current hemoglobin stable.  Right posterior bladder wall nodule: Noted to be 8 mm nodule on the right posterior bladder wall.  We recommend follow-up with urology  Leukocytosis: Likely reactive.  Monitor off antibiotics  Hypokalemia: Currently being monitored and supplemented as needed  Cough: Lungs are clear on auscultation.  On room air.  Continue supportive care, cough medications      Nutrition Problem: Inadequate oral intake Etiology: nausea, vomiting    DVT prophylaxis:SCDs Start: 02/12/23 0021     Code Status: Full Code  Family Communication: Wife  at bedside on 8/22  Patient status:Inpatient  Patient is from :Home  Anticipated discharge XL:[id number]  Estimated DC date:when TPN is turned off   Consultants: GI  Procedures:As above  Antimicrobials:  Anti-infectives (From admission, onward)    None       Subjective: Patient seen  and examined the bedside today.  He was sitting in the chair.  Wife at bedside.  He looks  comfortable.  No abdomen pain, nausea or vomiting.  He has some soft diet since yesterday but has not taken adequately.  He is having some cough today.  Denies any shortness of breath  Objective: Vitals:   02/23/23 1702 02/23/23 2019 02/24/23 0503 02/24/23 0828  BP: 125/72 125/65 (!) 114/58 112/62  Pulse: 95 88 84 85  Resp: 18 18 17 18   Temp: 98.5 F (36.9 C) 98.3 F (36.8 C)  98 F (36.7 C)  TempSrc: Oral   Oral  SpO2: 98% 98% 95% 96%  Weight:      Height:        Intake/Output Summary (Last 24 hours) at 02/24/2023 1052 Last data filed at 02/24/2023 0840 Gross per 24 hour  Intake 3 ml  Output --  Net 3 ml   Filed Weights   02/20/23 0326 02/21/23 0433 02/22/23 0552  Weight: 65.7 kg 65.4 kg 68 kg    Examination:  General exam: Overall comfortable, not in distress, pleasant elderly male HEENT: PERRL Respiratory system:  no wheezes or crackles  Cardiovascular system: S1 & S2 heard, RRR.  Gastrointestinal system: Abdomen is nondistended, soft and nontender. Central nervous system: Alert and oriented Extremities: No edema, no clubbing ,no cyanosis Skin: No rashes, no ulcers,no icterus       Data Reviewed: I have personally reviewed following labs and imaging studies  CBC: Recent Labs  Lab 02/19/23 0356 02/20/23 0207 02/21/23 0405 02/22/23 0340 02/23/23 0317  WBC 10.0 11.4* 11.6* 10.9* 10.9*  HGB 9.7* 10.0* 10.6* 10.2* 9.9*  HCT 30.0* 31.1* 33.7* 32.3* 31.1*  MCV 88.0 87.9 86.9 87.1 88.9  PLT 302 320 307 288 234   Basic Metabolic Panel: Recent Labs  Lab 02/18/23 0959 02/19/23 0356 02/20/23 1146 02/21/23 0405 02/22/23 0340 02/23/23 0317  NA 136 134* 136 135 133* 132*  K 3.9 4.1 4.5 4.3 4.2 4.0  CL 101 100 101 102 99 99  CO2 25 25 25 24 23 24   GLUCOSE 181* 234* 269* 141* 196* 170*  BUN 21 22 24* 21 21 20   CREATININE 0.66 0.74 0.66 0.67 0.69 0.65  CALCIUM 8.0* 8.1* 8.2* 8.1* 8.1* 7.9*  MG 1.9 1.9 2.1 2.3 2.2 1.9  PHOS 3.3 3.4 2.5 3.1 3.2  --      No  results found for this or any previous visit (from the past 240 hour(s)).   Radiology Studies: No results found.  Scheduled Meds:  Chlorhexidine Gluconate Cloth  6 each Topical Daily   docusate  50 mg Oral BID   feeding supplement  1 Container Oral BID BM   insulin aspart  0-15 Units Subcutaneous TID WC   insulin aspart  0-5 Units Subcutaneous QHS   lipase/protease/amylase  24,000 Units Oral TID WC   pantoprazole (PROTONIX) IV  40 mg Intravenous Q12H   senna  1 tablet Oral BID   sodium chloride flush  10-40 mL Intracatheter Q12H   sodium chloride flush  3 mL Intravenous Q12H   Continuous Infusions:  sodium chloride     promethazine (PHENERGAN) injection (IM or IVPB) 12.5 mg (02/16/23 0154)   TPN ADULT (ION) 85 mL/hr at 02/23/23 1816   TPN ADULT (ION)       LOS: 12 days   Burnadette Pop, MD [facility] P8/22/2024, 10:52 AM

## 2023-02-24 NOTE — Progress Notes (Signed)
Nutrition Brief Note  RD received a secure chat from MD requesting daily follow-up to assess pt PO intake. RD to start a calorie count. Envelope hung on door and discussed with pt in room. RN notified.   Interventions - Discontinue Boost Breeze - Continue Carnation Breakfast Essentials BID, each packet mixed with 8 ounces of 2% milk provides 13 grams of protein and 260 calories. - Continue oral nutrition supplement from home as able  - 48 hr Calorie Count   RD will follow-up with day 1 results tomorrow.   Kirby Crigler RD, LDN Clinical Dietitian See Loretha Stapler for contact information.

## 2023-02-24 NOTE — Telephone Encounter (Signed)
Pharmacy Patient Advocate Encounter   Received notification that prior authorization for droNABinol 2.5MG  capsules is required/requested.   Insurance verification completed.   The patient is insured through [employer]/ [employer] .   Per test claim: PA required; PA submitted to [employer]/ [employer] via CoverMyMeds Key/confirmation #/EOC [id number] Status is pending

## 2023-02-24 NOTE — Progress Notes (Signed)
PHARMACY - TOTAL PARENTERAL NUTRITION CONSULT NOTE  Indication: Gallstone pancreatitis with GOO  Patient Measurements: Height: 5\' 8"  (172.7 cm) Weight: 68 kg (149 lb 14.6 oz) IBW/kg (Calculated) : 68.4 TPN AdjBW (KG): 66.7 Body mass index is 22.79 kg/m.  Assessment:  53 YOM presented on 8/9 with NGT displacement.  Patient/family reports admitted in May for 21 days for severe pancreatitis.  He was discharged on an oral diet and did not eat well.  He was readmitted in July, from which he was discharged on tube feeding.  He was on TF for 3-4 days before he vomited and the NGT was dislodged.  Patient/family reports losing ~40 lbs since May and his intake has been poor since. RD and IR attempted post-pyloric tube placement and were unsuccessful.  Pharmacy consulted to manage TPN for pancreatitic pseudocyst and GOO.  Planning transfer to [facility] on 8/19 for cystogastrostomy versus endoscopic gastrojejunostomy. Will stop TF at midnight prior to procedure and return to [facility] after procedure.  Glucose / Insulin: BG 155-201, used 14 units SSI/ 24hr + 28 units in TPN  DM2 on Basaglar 10/d PTA, A1C 5.8.   Electrolytes: last labs 8/21- Na 132 (max in TPN), CoCa 9.1, Mg 1.9  others WNL  Renal: SCr  0.65, BUN WNL Hepatic: LFTs/TG WNL, albumin 2.2 Intake / Output; MIVF:  UOP x1 charted, LBM 8/21 (3 small ones per pt) GI Imaging:  8/10 CT: increase in pancreatic pseudocyst size, severe sigmoid diverticulosis, no bowel obstruction  8/11 KUB: nonobstructive bowel gas pattern, mild colonic stool 8/14 KUB: contrast in colon, minimal SB gas  GI Surgeries / Procedures: 8/12 Naso G Tube placement - unable to advance into duodenum d/t anatomy  8/14 upper EUS: LA grade C esophagitis without bleeding, pancreatic parenchymal abnormalities, cystic lesion in pancreatic head, feeding tube placed 8/19- to [facility] for endoscopic diverting gastrojejunostomy, walled off necrosis in peripancreatic region  Central access: PICC  placed on 02/15/23 TPN start date: 02/15/23  Nutritional Goals:  RD Estimated Needs Total Energy Estimated Needs: 2100-2300 kcal/d Total Protein Estimated Needs: 105-125 g/d Total Fluid Estimated Needs: 2.1-2.3L/d  Current Nutrition:  TPN  8/17 09:30 TF 20 ml/hr > advance 10 cc q4 hours to goal 65 ml/hr- per GI- stopped at 2300 for GI procedure 8/19 8/18 TF 65 ml/hr- tolerating  8/19 NPO (surgery [facility]) > FLD- 1 apple sauce, half a pudding, 1.5 juices. Thought could start stent diet already per [facility] (supposed to start 8/20) so ate outside food - ~4 bites each of mashed potatoes, chicken, green beans, stewed apples and 2 bites of banana pudding 8/20 FLD, refused Boost 8/21 FLD + Boost ordered but pt doesn't like it. Brought home milk protein drink from home, drank a small amount, doesn't remember what else he had but no N/V or abd pain  8/21 soft - sips of juice, few bites of rice crispies  Plan:  Continue TPN at goal 85 mL/hr at 1800, provides 110g AA, 2113 kcal, meeting 100% estimated needs  Electrolytes in TPN: Na 154 mEq/L (max), K 40 mEq/L, Ca 5 mEq/L, Mg 15 mEq/L (Max), Phos 15 mmol/L. Cl:Ac 1:1   Add standard MVI and trace elements to TPN Continue moderate SSI TID with meals and bedtime SSI Continue 28 units of regular insulin in TPN  Standard TPN labs 8/23 then on Mon/Thurs  F/u po intake and wean TPN as able  Alphia Moh, PharmD, BCPS, BCCP Clinical Pharmacist  Please check AMION for all [facility] Pharmacy phone numbers After  10:00 PM, call Main Pharmacy [phone]

## 2023-02-25 ENCOUNTER — Other Ambulatory Visit (HOSPITAL_COMMUNITY): Payer: Self-pay

## 2023-02-25 DIAGNOSIS — Z4659 Encounter for fitting and adjustment of other gastrointestinal appliance and device: Secondary | ICD-10-CM | POA: Diagnosis not present

## 2023-02-25 LAB — GLUCOSE, CAPILLARY
Glucose-Capillary: 196 mg/dL — ABNORMAL HIGH (ref 70–99)
Glucose-Capillary: 200 mg/dL — ABNORMAL HIGH (ref 70–99)
Glucose-Capillary: 135 mg/dL — ABNORMAL HIGH (ref 70–99)
Glucose-Capillary: 118 mg/dL — ABNORMAL HIGH (ref 70–99)

## 2023-02-25 LAB — MAGNESIUM: Magnesium: 2.1 mg/dL (ref 1.7–2.4)

## 2023-02-25 LAB — BASIC METABOLIC PANEL
Anion gap: 6 (ref 5–15)
BUN: 22 mg/dL (ref 8–23)
CO2: 28 mmol/L (ref 22–32)
Calcium: 8.1 mg/dL — ABNORMAL LOW (ref 8.9–10.3)
Chloride: 102 mmol/L (ref 98–111)
Creatinine, Ser: 0.73 mg/dL (ref 0.61–1.24)
GFR, Estimated: >60 mL/min (ref >60)
Glucose, Bld: 179 mg/dL — ABNORMAL HIGH (ref 70–99)
Potassium: 4.3 mmol/L (ref 3.5–5.1)
Sodium: 136 mmol/L (ref 135–145)

## 2023-02-25 LAB — PHOSPHORUS: Phosphorus: 3.8 mg/dL (ref 2.5–4.6)

## 2023-02-25 MED ORDER — TRAVASOL 10 % IV SOLN
INTRAVENOUS | Status: DC
  Filled 2023-02-25: qty 518.4

## 2023-02-25 MED ORDER — OXYCODONE HCL 5 MG PO TABS: 5.0000 mg | ORAL_TABLET | Freq: Once | ORAL | Status: DC | PRN

## 2023-02-25 MED ORDER — DOCUSATE SODIUM 50 MG PO CAPS
50.0000 mg | ORAL_CAPSULE | Freq: Two times a day (BID) | ORAL | Status: DC
  Administered 2023-02-25: 50 mg via ORAL
  Filled 2023-02-25 (×3): qty 1

## 2023-02-25 MED ORDER — GUAIFENESIN 200 MG PO TABS: 100.0000 mg | ORAL_TABLET | ORAL | Status: DC | PRN

## 2023-02-25 MED ORDER — TRAVASOL 10 % IV SOLN
INTRAVENOUS | Status: DC
  Filled 2023-02-25: qty 1101.6

## 2023-02-25 MED ORDER — PANTOPRAZOLE SODIUM 40 MG PO TBEC
40.0000 mg | DELAYED_RELEASE_TABLET | Freq: Two times a day (BID) | ORAL | Status: DC
  Administered 2023-02-25 – 2023-02-26 (×2): 40 mg via ORAL
  Filled 2023-02-25 (×2): qty 1

## 2023-02-25 NOTE — Progress Notes (Signed)
PROGRESS NOTE  Jon Bray  ZOX:[id number] DOB: 1940-11-23 DOA: 02/11/2023 PCP: Danella Penton, MD   Brief Narrative: Patient is a 82 year old male with history of esophageal stenosis status post core track/NG tube placed on 02/02/2023, insulin-dependent diabetes type 2, hypertension, coronary artery disease, paroxysmal atrial fibrillation not on anticoagulation, gallstone pancreatitis status post cholecystectomy on May 2024, chronic anemia, hyponatremia, moderate malnutrition who presented with complaint of vomiting, displaced NG tube.  He was recently hospitalized here at the end of July for AKI, dehydration, acute pancreatitis.  EGD at that time showed esophageal stenosis, hyperlipidemia, ulcerated gastric fundus with congested mucosa.  Feeding tube was placed on 7/31 and to continue liquid diet.  Patient was discharged home with core track and supposed to follow-up with GI on 8/20.  On presentation CT imaging showed inflammatory changes with stranding of the fat surrounding, peripancreatic fluid collection.  GI consulted. Status post endoscopic ultrasound on 8/14.  Status post ultrasound-guided gastrojejunostomy with Dr. Edyth Gunnels at [facility] on 8/19.    Tolerating soft diet now, dietitian following.  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  On last admission, he was discharged with feeding tube.  Feeding tube was dislodged.  Status post endoscopic ultrasound on 8/14.  NG tube replaced, started on trickle feeds,now removed.  Patient was transferred to [facility] and underwent endoscopic ultrasound-guided gastrojejunostomy by Dr. Edyth Gunnels.  Dr. Edyth Gunnels recommended CT abdomen/pelvis in a month and follow-up as an outpatient at [facility].  Continue Creon.  Currently on soft diet.  Started on Marinol, dietitian following. plan to taper the TPN  History of esophageal stenosis/chronic nausea/vomiting: Had intermittent nausea and vomiting.  Currently on soft diet, TPN.  Denies any abdomen pain  Diabetes mellitus: Recent A1c of 5.2.  Continue to monitor blood sugars  Chronic A-fib: Rate is controlled.  Not a candidate for anticoagulation.  On aspirin which will be continued  History of coronary artery disease: No anginal symptoms at present.  Takes aspirin  Chronic microcytic anemia: Current hemoglobin stable.  Right posterior bladder wall nodule: Noted to be 8 mm nodule on the right posterior bladder wall.  We recommend follow-up with urology  Leukocytosis: Likely reactive.  Monitor off antibiotics  Hypokalemia: Currently being monitored and supplemented as needed  Cough: Lungs are clear on auscultation.  On room air.  Continue supportive care, cough medications      Nutrition Problem: Inadequate oral intake Etiology: nausea, vomiting    DVT prophylaxis:SCDs Start: 02/12/23 0021     Code Status: Full Code  Family Communication: Wife  at bedside on 8/23  Patient status:Inpatient  Patient is from :Home  Anticipated discharge WJ:XBJY  Estimated DC date:when TPN is turned off, likely tomorrow   Consultants: GI  Procedures:As above  Antimicrobials:  Anti-infectives (From admission, onward)    None       Subjective: Patient seen  and examined at bedside today.  Hemodynamically stable.  Comfortable.  Denies  any new complaints.  Wife at bedside too.  He ate a whole piece of pancake today.  He is motivated to go home soon  Objective: Vitals:   02/24/23 2142 02/25/23 0345 02/25/23 0500 02/25/23 0742  BP: 112/64 (!) 120/56  (!) 100/52  Pulse: 80 91  83  Resp: 18   16  Temp: 98.2 F (36.8 C) 98 F (36.7 C)  98.3 F (36.8 C)  TempSrc: Oral Axillary  Oral  SpO2: 98% 100%  96%  Weight:   69 kg   Height:        Intake/Output Summary (Last 24 hours) at 02/25/2023 1335 Last data filed at 02/25/2023 0844 Gross per 24 hour  Intake 938 ml  Output --  Net 938 ml   Filed Weights   02/21/23 0433 02/22/23 0552 02/25/23 0500  Weight: 65.4 kg 68 kg 69 kg    Examination:  General exam: Overall comfortable, not in distress pleasant elder male HEENT: PERRL Respiratory system:  no wheezes or crackles  Cardiovascular system: S1 & S2 heard, RRR.  Gastrointestinal system: Abdomen is nondistended, soft and nontender. Central nervous system: Alert and oriented Extremities: No edema, no clubbing ,no cyanosis Skin: No rashes, no ulcers,no icterus        Data Reviewed: I have personally reviewed following labs and imaging studies  CBC: Recent Labs  Lab 02/19/23 0356 02/20/23 0207 02/21/23 0405 02/22/23 0340 02/23/23 0317  WBC 10.0 11.4* 11.6* 10.9* 10.9*  HGB 9.7* 10.0* 10.6* 10.2* 9.9*  HCT 30.0* 31.1* 33.7* 32.3* 31.1*  MCV 88.0 87.9 86.9 87.1 88.9  PLT 302 320 307 288 234   Basic Metabolic Panel: Recent Labs  Lab 02/19/23 0356 02/20/23 1146 02/21/23 0405 02/22/23 0340 02/23/23 0317 02/25/23 0356  NA 134* 136 135 133* 132* 136  K 4.1 4.5 4.3 4.2 4.0 4.3  CL 100 101 102 99 99 102  CO2 25 25 24 23 24 28   GLUCOSE 234* 269* 141* 196* 170* 179*  BUN 22 24* 21 21 20 22   CREATININE 0.74 0.66 0.67 0.69 0.65 0.73  CALCIUM 8.1* 8.2* 8.1* 8.1* 7.9* 8.1*  MG 1.9 2.1 2.3 2.2 1.9 2.1  PHOS 3.4 2.5 3.1 3.2  --  3.8     No results found for this or any previous visit (from the past 240  hour(s)).   Radiology Studies: No results found.  Scheduled Meds:  aspirin  81 mg Oral Daily   Chlorhexidine Gluconate Cloth  6 each Topical Daily   docusate sodium  50 mg Oral BID   dronabinol  2.5 mg Oral BID AC   insulin aspart  0-15 Units Subcutaneous TID WC   insulin aspart  0-5 Units Subcutaneous QHS   lipase/protease/amylase  24,000 Units Oral TID WC   pantoprazole  40 mg Oral BID   senna  1 tablet Oral BID   sodium chloride  1 drop Both Eyes QHS   sodium chloride flush  10-40 mL Intracatheter Q12H   sodium chloride flush  3 mL Intravenous Q12H   Continuous Infusions:  sodium chloride     promethazine (PHENERGAN) injection (IM or IVPB) 12.5 mg (02/16/23 0154)   TPN ADULT (ION) 85 mL/hr at 02/24/23 1754   TPN ADULT (ION)       LOS: 13 days   Burnadette Pop, MD [facility] P8/23/2024, 1:35 PM

## 2023-02-25 NOTE — Plan of Care (Signed)

## 2023-02-25 NOTE — Discharge Instructions (Signed)

## 2023-02-25 NOTE — Progress Notes (Signed)
Calorie Count Note - Day 1 Results  48 hour calorie count ordered.  Diet: Soft Supplements: Acupuncturist - BID  Estimated Nutritional Needs:  Kcal:  2100-2300 kcal/d Protein:  105-125 g/d Fluid:  2.1-2.3L/d  Breakfast: 192 kcal, 5 gm protein Lunch: 227 kcal, 4 gm protein Dinner: 125 kcal, 3 gm protein  Supplements: 8 oz MRE protein shake (110 kcal, 19 gm protein)  Total intake: 654 kcal (31% of minimum estimated needs)  31 protein (30% of minimum estimated needs)  Nutrition Diagnosis: Inadequate oral intake related to nausea, vomiting as evidenced by per patient/family report.   Goal: Patient will meet greater than or equal to 90% of their needs   Intervention:  - Continue [employer] Essentials BID, each packet mixed with 8 ounces of 2% milk provides 13 grams of protein and 260 calories. - Continue oral nutrition supplement from home as able  - 48 hr Calorie Count   Kirby Crigler RD, LDN Clinical Dietitian See [facility] for contact information.

## 2023-02-25 NOTE — Telephone Encounter (Signed)
Pharmacy Patient Advocate Encounter  Received notification from [employer]/ Rx Advance that Prior Authorization for droNABinol 2.5MG  capsules  has been DENIED. Please advise how you'd like to proceed. Full denial letter will be uploaded to the media tab. See denial reason below.   PA #/Case ID/Reference #: [id number]

## 2023-02-25 NOTE — Progress Notes (Addendum)
PHARMACY - TOTAL PARENTERAL NUTRITION CONSULT NOTE  Indication: Gallstone pancreatitis with GOO  Patient Measurements: Height: 5\' 8"  (172.7 cm) Weight: 69 kg (152 lb 1.9 oz) IBW/kg (Calculated) : 68.4 TPN AdjBW (KG): 66.7 Body mass index is 23.13 kg/m.  Assessment:  69 YOM presented on 8/9 with NGT displacement.  Patient/family reports admitted in May for 21 days for severe pancreatitis.  He was discharged on an oral diet and did not eat well.  He was readmitted in July, from which he was discharged on tube feeding.  He was on TF for 3-4 days before he vomited and the NGT was dislodged.  Patient/family reports losing ~40 lbs since May and his intake has been poor since. RD and IR attempted post-pyloric tube placement and were unsuccessful.  Pharmacy consulted to manage TPN for pancreatitic pseudocyst and GOO.  Planning transfer to [facility] on 8/19 for cystogastrostomy versus endoscopic gastrojejunostomy. Will stop TF at midnight prior to procedure and return to [facility] after procedure.  Glucose / Insulin: BG 143-201, used 11 units SSI/ 24hr + 28 units in TPN  DM2 on Basaglar 10/d PTA, A1C 5.8.   Electrolytes: last labs 8/23- Na 136 (max in TPN), CoCa 9.3, others WNL  Renal: SCr  0.65, BUN WNL Hepatic: LFTs/TG WNL, albumin 2.2 Intake / Output; MIVF:  UOP not charted, LBM 8/21 (3 small ones per pt) GI Imaging:  8/10 CT: increase in pancreatic pseudocyst size, severe sigmoid diverticulosis, no bowel obstruction  8/11 KUB: nonobstructive bowel gas pattern, mild colonic stool 8/14 KUB: contrast in colon, minimal SB gas  GI Surgeries / Procedures: 8/12 Naso G Tube placement - unable to advance into duodenum d/t anatomy  8/14 upper EUS: LA grade C esophagitis without bleeding, pancreatic parenchymal abnormalities, cystic lesion in pancreatic head, feeding tube placed 8/19- to [facility] for endoscopic diverting gastrojejunostomy, walled off necrosis in peripancreatic region  Central access: PICC placed on  02/15/23 TPN start date: 02/15/23  Nutritional Goals:  RD Estimated Needs Total Energy Estimated Needs: 2100-2300 kcal/d Total Protein Estimated Needs: 105-125 g/d Total Fluid Estimated Needs: 2.1-2.3L/d  Current Nutrition:  TPN  8/17 09:30 TF 20 ml/hr > advance 10 cc q4 hours to goal 65 ml/hr- per GI- stopped at 2300 for GI procedure 8/19 8/18 TF 65 ml/hr- tolerating  8/19 NPO (surgery [facility]) > FLD- 1 apple sauce, half a pudding, 1.5 juices. Thought could start stent diet already per [facility] (supposed to start 8/20) so ate outside food - ~4 bites each of mashed potatoes, chicken, green beans, stewed apples and 2 bites of banana pudding 8/20 FLD, refused Boost 8/21 FLD + Boost ordered but pt doesn't like it. Brought home milk protein drink from home, drank a small amount, doesn't remember what else he had but no N/V or abd pain  8/21 soft - sips of juice, few bites of rice crispies 8/23 patient tolerating PO intake. Ate a full pancake w/o issues  Plan:  Reduce TPN to half rate [40 mL/hr] at 1800, provides 52g AA, 963 kcal, meeting ~50% estimated needs  Electrolytes in TPN: Na 150 mEq/L (max), K 60 mEq/L, Ca 5 mEq/L, Mg 15 mEq/L (Max), Phos 15 mmol/L. Cl:Ac 1:1   Add standard MVI and trace elements to TPN Continue moderate SSI TID with meals and bedtime SSI Reduce 14 units of regular insulin in TPN  Plan is to discontinue TPN AM 8/24   Lenna Hagarty BS, PharmD, BCPS Clinical Pharmacist 02/25/2023 8:09 AM  Contact: [phone] after 3 PM  "  Be curious, not judgmental..." -Debbora Dus

## 2023-02-26 ENCOUNTER — Other Ambulatory Visit (HOSPITAL_COMMUNITY): Payer: Self-pay

## 2023-02-26 DIAGNOSIS — Z4659 Encounter for fitting and adjustment of other gastrointestinal appliance and device: Secondary | ICD-10-CM | POA: Diagnosis not present

## 2023-02-26 LAB — COMPREHENSIVE METABOLIC PANEL
ALT: 37 U/L (ref 0–44)
AST: 30 U/L (ref 15–41)
Albumin: 2.2 g/dL — ABNORMAL LOW (ref 3.5–5.0)
Alkaline Phosphatase: 58 U/L (ref 38–126)
Anion gap: 8 (ref 5–15)
BUN: 21 mg/dL (ref 8–23)
CO2: 26 mmol/L (ref 22–32)
Calcium: 7.9 mg/dL — ABNORMAL LOW (ref 8.9–10.3)
Chloride: 100 mmol/L (ref 98–111)
Creatinine, Ser: 0.62 mg/dL (ref 0.61–1.24)
GFR, Estimated: >60 mL/min (ref >60)
Glucose, Bld: 163 mg/dL — ABNORMAL HIGH (ref 70–99)
Potassium: 4.3 mmol/L (ref 3.5–5.1)
Sodium: 134 mmol/L — ABNORMAL LOW (ref 135–145)
Total Bilirubin: 0.6 mg/dL (ref 0.3–1.2)
Total Protein: 5.8 g/dL — ABNORMAL LOW (ref 6.5–8.1)

## 2023-02-26 LAB — GLUCOSE, CAPILLARY
Glucose-Capillary: 140 mg/dL — ABNORMAL HIGH (ref 70–99)
Glucose-Capillary: 111 mg/dL — ABNORMAL HIGH (ref 70–99)

## 2023-02-26 MED ORDER — ENSURE ENLIVE PO LIQD
237.0000 mL | Freq: Three times a day (TID) | ORAL | 30 refills | Status: DC
  Filled 2023-02-26: qty 237, 1d supply, fill #0

## 2023-02-26 MED ORDER — DRONABINOL 2.5 MG PO CAPS: 2.5000 mg | ORAL_CAPSULE | Freq: Two times a day (BID) | ORAL | 0 refills | Status: DC

## 2023-02-26 MED ORDER — ENSURE ENLIVE PO LIQD: 237.0000 mL | Freq: Three times a day (TID) | ORAL | 30 refills | Status: AC

## 2023-02-26 MED ORDER — DOCUSATE SODIUM 100 MG PO CAPS
100.0000 mg | ORAL_CAPSULE | Freq: Every day | ORAL | Status: DC
  Administered 2023-02-26: 100 mg via ORAL
  Filled 2023-02-26: qty 1

## 2023-02-26 MED ORDER — DRONABINOL 2.5 MG PO CAPS
2.5000 mg | ORAL_CAPSULE | Freq: Two times a day (BID) | ORAL | 0 refills | Status: AC
  Filled 2023-02-26: qty 30, 15d supply, fill #0

## 2023-02-26 MED ORDER — ENSURE ENLIVE PO LIQD: 237.0000 mL | Freq: Three times a day (TID) | ORAL | Status: DC

## 2023-02-26 MED ORDER — DRONABINOL 2.5 MG PO CAPS
2.5000 mg | ORAL_CAPSULE | Freq: Two times a day (BID) | ORAL | 0 refills | Status: DC
Start: 2023-02-26 — End: 2023-02-26
  Filled 2023-02-26: qty 30, 15d supply, fill #0

## 2023-02-26 NOTE — Progress Notes (Signed)
AVS given and reviewed with pt and wife at bedside. Medications discussed. Wife picked up Marinol rx from [facility] pharmacy. All questions answered to satisfaction. Pt and wife verbalized understanding of information given. Pt escorted off the unit with all belongings via wheelchair by staff member.

## 2023-02-26 NOTE — Plan of Care (Signed)
  Problem: Pain Managment: Goal: General experience of comfort will improve Outcome: Progressing   Problem: Safety: Goal: Ability to remain free from injury will improve Outcome: Progressing   Problem: Skin Integrity: Goal: Risk for impaired skin integrity will decrease Outcome: Progressing   

## 2023-02-26 NOTE — Progress Notes (Signed)
PICC line removed per order and with no complications.  Pressure held to achieve hemostasis.  Vasline/gauze/tegaderm applied.  Aftercare instructions reviewed with patient who verbalized understanding.

## 2023-02-26 NOTE — Discharge Summary (Signed)
Physician Discharge Summary  Jon Bray RUE:[id number] DOB: 12-31-1940 DOA: 02/11/2023  PCP: Danella Penton, MD  Admit date: 02/11/2023 Discharge date: 02/26/2023  Admitted From: Home Disposition:  Home  Discharge Condition:Stable CODE STATUS:FULL Diet recommendation: Heart Healthy  Brief/Interim Summary: Patient is a 82 year old male with history of esophageal stenosis status post core track/NG tube placed on 02/02/2023, insulin-dependent diabetes type 2, hypertension, coronary artery disease, paroxysmal atrial fibrillation not on anticoagulation, gallstone pancreatitis status post cholecystectomy on May 2024, chronic anemia, hyponatremia, moderate malnutrition who presented with complaint of vomiting, displaced NG tube.  He was recently hospitalized here at the end of July for AKI, dehydration, acute pancreatitis.  EGD at that time showed esophageal stenosis, hyperlipidemia, ulcerated gastric fundus with congested mucosa.  Feeding tube was placed on 7/31 and to continue liquid diet.  Patient was discharged home with core track and supposed to follow-up with GI on 8/20.  On presentation CT imaging showed inflammatory changes with stranding of the fat surrounding, peripancreatic fluid collection.  GI consulted. Status post endoscopic ultrasound on 8/14.  Status post ultrasound-guided gastrojejunostomy with Dr. Edyth Gunnels at [facility] on 8/19.    Tolerating soft diet now, dietitian was following .  Rate of TPN reduced to half today and finally stopped.  Patient is motivated to go home.  He will follow-up with outpatient gastroenterology .  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  On last admission, he was discharged with feeding tube.  Feeding tube was dislodged.  Status post endoscopic ultrasound on 8/14.  NG tube  replaced, started on trickle feeds,now removed.  Patient was transferred to [facility] and underwent endoscopic ultrasound-guided gastrojejunostomy by Dr. Edyth Gunnels.  Dr. Edyth Gunnels recommended CT abdomen/pelvis in a month and follow-up as an outpatient at [facility].  Continue Creon.  Currently on soft diet and tolerating.  Started on Marinol, dietitian was following.tapered TPN   History of esophageal stenosis/chronic nausea/vomiting: Had intermittent nausea and vomiting.  Now resolved and having bowel movements.  Denies any abdomen pain   Diabetes mellitus: Recent A1c of 5.2.  Continue home regimen of insulin  Chronic A-fib: Rate is controlled.  Not a candidate for anticoagulation.  On aspirin which will be continued   History of coronary artery disease: No anginal symptoms at present.  Takes aspirin   Chronic microcytic anemia: Current hemoglobin stable.   Right posterior bladder wall nodule: Noted to be 8 mm nodule on the right posterior bladder wall.  We recommend follow-up with urology on nonemergent basis   Leukocytosis: Likely reactive.  Monitor off antibiotics    Cough: Lungs are clear on auscultation.  On room air.  Resolved Discharge Diagnoses:  Principal Problem:   Encounter for feeding tube placement Active Problems:   Displaced nasogastric feeding tube   Esophageal stenosis   Chronic pancreatitis (HCC)   Diabetes type 2, controlled (HCC)   Leukocytosis   Essential hypertension   History of CAD (coronary artery disease)   Atrial fibrillation, chronic -not on coagulation (HCC)   Chronic disease anemia   Pancreatic pseudocyst   Acute recurrent pancreatitis   Intractable nausea and vomiting   Gastric outflow obstruction   Hypokalemia    Discharge Instructions  Discharge Instructions     Diet Carb Modified   Complete by: As directed    Discharge instructions   Complete by: As directed    1)Please take prescribed medications as  instructed 2)Follow up with your PCP next week 3)Follow  up with your local gastroenterologist and gastroenterology at [facility]   Increase activity slowly   Complete by: As directed       Allergies as of 02/26/2023   No Known Allergies      Medication List     STOP taking these medications    feeding supplement (PROSource TF20) liquid   free water Soln   oxyCODONE 5 MG immediate release tablet Commonly known as: Roxicodone       TAKE these medications    acetaminophen 325 MG tablet Commonly known as: TYLENOL Place 2 tablets (650 mg total) into feeding tube every 6 (six) hours as needed for mild pain.   aspirin 81 MG chewable tablet Chew 81 mg by mouth daily.   Blood Glucose Monitoring Suppl Devi 1 each by Does not apply route in the morning, at noon, and at bedtime. May substitute to any manufacturer covered by patient's insurance.   BLOOD GLUCOSE TEST STRIPS Strp 1 each by In Vitro route in the morning, at noon, and at bedtime. May substitute to any manufacturer covered by patient's insurance.   Creon 36000-114000 units Cpep capsule Generic drug: lipase/protease/amylase Take 36,000 Units by mouth 3 (three) times daily.   dronabinol 2.5 MG capsule Commonly known as: MARINOL Take 1 capsule (2.5 mg total) by mouth 2 (two) times daily before lunch and supper.   DSS 100 MG Caps Take 100 mg by mouth daily as needed (for constipaion).   feeding supplement Liqd Take 237 mLs by mouth 3 (three) times daily between meals. What changed:  how much to take how to take this when to take this   Lancet Device Misc 1 each by Does not apply route in the morning, at noon, and at bedtime. May substitute to any manufacturer covered by patient's insurance.   Lantus SoloStar 100 UNIT/ML Solostar Pen Generic drug: insulin glargine Inject 10 Units into the skin daily.   multivitamin tablet Take 1 tablet by mouth daily.   ondansetron 4 MG disintegrating tablet Commonly known as: ZOFRAN-ODT Take 4 mg by mouth every 8 (eight) hours as  needed for refractory nausea / vomiting.   pantoprazole 40 MG tablet Commonly known as: PROTONIX Take 1 tablet (40 mg total) by mouth 2 (two) times daily.   Pen Needles 3/16" 31G X 5 MM Misc Use to inject insulin   polyethylene glycol powder 17 GM/SCOOP powder Commonly known as: MiraLax Take 17 g by mouth 2 (two) times daily as needed for moderate constipation or mild constipation.   sodium chloride 5 % ophthalmic ointment Commonly known as: MURO 128 Place 1 Application into both eyes at bedtime.   VITAMIN C ER PO Take 1 tablet by mouth daily.   ZINC-220 PO Take 1 tablet by mouth daily.        Follow-up Information     Danella Penton, MD. Schedule an appointment as soon as possible for a visit in 1 week(s).   Specialty: Internal Medicine Contact information: [phone] [facility] [address] [facility] [place] [address] [phone]                No Known Allergies  Consultations: GI   Procedures/Studies: DG Abd 2 Views  Result Date: 02/16/2023 CLINICAL DATA:  Feeding tube placement EXAM: ABDOMEN - 2 VIEW limited for feeding tube COMPARISON:  X-ray 02/13/2023 FINDINGS: Limited x-ray for tube placement has a Dobbhoff tube in place with tip extending along the  course of the distal third portion of the duodenal. There is contrast noted within the colon. Minimal small bowel gas. Air towards the rectum. No obstruction or obvious free air. There is some lucency however in the right upper quadrant which may be air in the biliary tree. Surgical clips in the right upper quadrant as well. Please correlate with history. IMPRESSION: Limited x-ray for tube placement has a Dobbhoff tube with tip overlying the third portion of the duodenal. Contrast in the colon. There is tubular air in the right upper quadrant overlying the hepatic shadow. Based on appearance this could be air in the biliary tree. Please correlate with the history. Electronically Signed   By:  Karen Kays M.D.   On: 02/16/2023 17:11   [place] EKG SITE RITE  Result Date: 02/15/2023 If Site Rite image not attached, placement could not be confirmed due to current cardiac rhythm.  DG Naso G Tube Plc W/Fl W/Rad  Result Date: 02/14/2023 CLINICAL DATA:  Feeding tube placement EXAM: NASO G TUBE PLACEMENT WITH FL AND WITH RAD CONTRAST:  50mL OMNIPAQUE IOHEXOL 300 MG/ML  SOLN FLUOROSCOPY: Radiation Exposure Index (if provided by the fluoroscopic device): 38.6 mGy air kerma COMPARISON:  Abdomen radiograph 02/13/2023 FINDINGS: Feeding tube advanced into the stomach antrum but despite multiple attempts by various operators including Dr. Ova Freshwater, the use of contrast, the use of wires, turning the patient, and other maneuvers, we were not able to cause the tube to advance into the duodenum. There seems to be either stenosis or an unusual turn at the junction of the stomach and duodenum which could not be easily preferentially selected, possibly due to mass effect from the adjacent pseudocyst. After over 8 minutes of fluoroscopy time, further advancement attempts were felt to be futile, and the tube was left in position with tip in the stomach antrum. Post pyloric placement will likely require endoscopic assistance. IMPRESSION: 1. Feeding tube tip is in the stomach antrum. The tip could not be advanced into the duodenum despite extensive maneuvers and over 8 minutes of fluoroscopy time representing cumulative attempts. Post pyloric placement will likely require endoscopic assistance. Electronically Signed   By: Gaylyn Rong M.D.   On: 02/14/2023 15:18   DG Abd 2 Views  Result Date: 02/13/2023 CLINICAL DATA:  [id number] Recurrent vomiting [id number] EXAM: ABDOMEN - 2 VIEW COMPARISON:  02/12/2023 CT abdomen/pelvis FINDINGS: No dilated small bowel loops or air-fluid levels. Mild colonic stool. No evidence of pneumatosis or pneumoperitoneum. Clear lung bases. Cholecystectomy clips are seen in the right upper  quadrant of the abdomen. Abdominal aortic atherosclerosis. Moderate lumbar spondylosis. No radiopaque nephrolithiasis. IMPRESSION: Nonobstructive bowel gas pattern. Mild colonic stool. Electronically Signed   By: Delbert Phenix M.D.   On: 02/13/2023 14:51   CT ABDOMEN PELVIS W CONTRAST  Result Date: 02/12/2023 CLINICAL DATA:  Vomiting.  Pancreatitis. EXAM: CT ABDOMEN AND PELVIS WITH CONTRAST TECHNIQUE: Multidetector CT imaging of the abdomen and pelvis was performed using the standard protocol following bolus administration of intravenous contrast. RADIATION DOSE REDUCTION: This exam was performed according to the departmental dose-optimization program which includes automated exposure control, adjustment of the mA and/or kV according to patient size and/or use of iterative reconstruction technique. CONTRAST:  75mL OMNIPAQUE IOHEXOL 350 MG/ML SOLN COMPARISON:  CT abdomen pelvis dated 01/28/2023. FINDINGS: Lower chest: Minimal left lung base atelectasis/scarring. The visualized lung bases are otherwise clear. There is coronary vascular calcification. No intra-abdominal free air.  Trace free fluid within the pelvis. Hepatobiliary: The liver is  unremarkable. No biliary dilatation. Cholecystectomy. Mild pneumobilia. Pancreas: Inflammatory changes and stranding of the fat surrounding the head and uncinate process of pancreas in keeping with provided history of pancreatitis. Multiple loculated fluid collection predominantly in the head and uncinate process of the pancreas in keeping with pseudocysts. The largest collection measures approximately 6.5 x 5.5 cm, previously 4.9 x 3.5 cm. Overall there has been interval increase in the size of the peripancreatic abscesses or pseudocysts since the prior CT. There is loss of fat plane between the pancreatic pseudocyst and posterior wall of the stomach. There is extension of the pancreatic pseudocyst into the posterior gastric wall. Similar appearance of a 2.2 cm hypodense  lesion in the tail of the pancreas. Spleen: Normal in size without focal abnormality. Adrenals/Urinary Tract: The adrenal glands are unremarkable. There is no hydronephrosis on either side. There is symmetric enhancement and excretion of contrast by both kidneys. The visualized ureters appear unremarkable. There is an 8 mm enhancing nodule in the right posterior bladder wall adjacent to the ureterovesical junction suspicious for a urothelial neoplasm. Further evaluation with cystoscopy is recommended. Stomach/Bowel: There is severe sigmoid diverticulosis without active inflammatory changes. There is no bowel obstruction. The appendix is normal. Vascular/Lymphatic: Advanced aortoiliac atherosclerotic disease. The IVC is unremarkable. No portal venous gas. There is no adenopathy. Reproductive: The prostate and seminal vesicles are grossly unremarkable. No pelvic mass. Other: None Musculoskeletal: Osteopenia with degenerative changes of the spine. No acute osseous pathology. IMPRESSION: 1. Interval increase in the size of the pancreatic pseudocysts since the prior CT extending into the posterior wall of the stomach. 2. Severe sigmoid diverticulosis. No bowel obstruction. Normal appendix. 3. An 8 mm enhancing nodule in the right posterior bladder wall adjacent to the ureterovesical junction suspicious for a urothelial neoplasm. Further evaluation with cystoscopy is recommended. 4.  Aortic Atherosclerosis (ICD10-I70.0). Electronically Signed   By: Elgie Collard M.D.   On: 02/12/2023 16:51   VAS [place] LOWER EXTREMITY VENOUS (DVT)  Result Date: 02/02/2023  Lower Venous DVT Study Patient Name:  LOI ALWORTH [facility]  Date of Exam:   02/02/2023 Medical Rec #: [id number]     Accession #:    [id number] Date of Birth: 1940-09-26      Patient Gender: M Patient Age:   20 years Exam Location:  [facility] Procedure:      VAS [place] LOWER EXTREMITY VENOUS (DVT) Referring Phys: RAMESH KC  --------------------------------------------------------------------------------  Indications: Swelling.  Risk Factors: Recent Extended Hospital Stay Surgery 01/31/23. Comparison Study: No prior study Performing Technologist: Shona Simpson  Examination Guidelines: A complete evaluation includes B-mode imaging, spectral Doppler, color Doppler, and power Doppler as needed of all accessible portions of each vessel. Bilateral testing is considered an integral part of a complete examination. Limited examinations for reoccurring indications may be performed as noted. The reflux portion of the exam is performed with the patient in reverse Trendelenburg.  +---------+---------------+---------+-----------+----------+--------------+ RIGHT    CompressibilityPhasicitySpontaneityPropertiesThrombus Aging +---------+---------------+---------+-----------+----------+--------------+ CFV      Full           Yes      Yes                                 +---------+---------------+---------+-----------+----------+--------------+ SFJ      Full                                                        +---------+---------------+---------+-----------+----------+--------------+  FV Prox  Full                                                        +---------+---------------+---------+-----------+----------+--------------+ FV Mid   Full                                                        +---------+---------------+---------+-----------+----------+--------------+ FV DistalFull                                                        +---------+---------------+---------+-----------+----------+--------------+ PFV      Full                                                        +---------+---------------+---------+-----------+----------+--------------+ POP      Full           Yes      Yes                                 +---------+---------------+---------+-----------+----------+--------------+  PTV      Full                                                        +---------+---------------+---------+-----------+----------+--------------+ PERO     Full                                                        +---------+---------------+---------+-----------+----------+--------------+   +---------+---------------+---------+-----------+----------+--------------+ LEFT     CompressibilityPhasicitySpontaneityPropertiesThrombus Aging +---------+---------------+---------+-----------+----------+--------------+ CFV      Full           Yes      Yes                                 +---------+---------------+---------+-----------+----------+--------------+ SFJ      Full                                                        +---------+---------------+---------+-----------+----------+--------------+ FV Prox  Full                                                        +---------+---------------+---------+-----------+----------+--------------+  FV Mid   Full                                                        +---------+---------------+---------+-----------+----------+--------------+ FV DistalFull                                                        +---------+---------------+---------+-----------+----------+--------------+ PFV      Full                                                        +---------+---------------+---------+-----------+----------+--------------+ POP      Full           Yes      Yes                                 +---------+---------------+---------+-----------+----------+--------------+ PTV      Full                                                        +---------+---------------+---------+-----------+----------+--------------+ PERO     Full                                                        +---------+---------------+---------+-----------+----------+--------------+     Summary: BILATERAL: - No evidence of deep  vein thrombosis seen in the lower extremities, bilaterally. -No evidence of popliteal cyst, bilaterally.   *See table(s) above for measurements and observations. Electronically signed by Heath Lark on 02/02/2023 at 5:34:12 PM.    Final    DG Abd Portable 1V  Result Date: 02/02/2023 CLINICAL DATA:  Evaluate feeding tube placement EXAM: PORTABLE ABDOMEN - 1 VIEW COMPARISON:  CT 01/28/2023 FINDINGS: Interval placement of feeding tube. The tip appears post pyloric in the expected location of the distal portion of the descending duodenum. Surgical clips identified within the right upper quadrant of the abdomen. Aortic atherosclerotic calcifications identified IMPRESSION: Feeding tube tip appears post pyloric in the expected location of the distal portion of the descending duodenum. Electronically Signed   By: Signa Kell M.D.   On: 02/02/2023 12:57   CT Angio Chest Pulmonary Embolism (PE) W or WO Contrast  Result Date: 02/01/2023 CLINICAL DATA:  Chest pain. EXAM: CT ANGIOGRAPHY CHEST WITH CONTRAST TECHNIQUE: Multidetector CT imaging of the chest was performed using the standard protocol during bolus administration of intravenous contrast. Multiplanar CT image reconstructions and MIPs were obtained to evaluate the vascular anatomy. RADIATION DOSE REDUCTION: This exam was performed according to the departmental dose-optimization program which includes automated exposure control, adjustment of the mA and/or kV according to patient size and/or  use of iterative reconstruction technique. CONTRAST:  75mL OMNIPAQUE IOHEXOL 350 MG/ML SOLN COMPARISON:  Chest radiograph dated 02/01/2023. CT dated 12/08/2018. FINDINGS: Cardiovascular: Mild cardiomegaly. No pericardial effusion. Three-vessel coronary vascular calcification. Moderate atherosclerotic calcification of the thoracic aorta. No aneurysmal dilatation. No pulmonary artery embolus identified. Mediastinum/Nodes: No hilar or mediastinal adenopathy. The esophagus is  grossly unremarkable. No mediastinal fluid collection. Lungs/Pleura: Minimal bibasilar subpleural atelectasis/scarring. No focal consolidation, pleural effusion, or pneumothorax. The central airways are patent. Upper Abdomen: Partially visualized trace perihepatic fluid. Air in the central liver, likely pneumobilia. Musculoskeletal: Osteopenia.  No acute osseous pathology. Review of the MIP images confirms the above findings. IMPRESSION: 1. No CT evidence of pulmonary embolism. 2. Mild cardiomegaly. 3.  Aortic Atherosclerosis (ICD10-I70.0). Electronically Signed   By: Elgie Collard M.D.   On: 02/01/2023 20:19   DG Chest Port 1 View  Result Date: 02/01/2023 CLINICAL DATA:  [id number] Rib pain [id number] EXAM: PORTABLE CHEST 1 VIEW COMPARISON:  12/07/2022. FINDINGS: Bilateral lung fields are clear. Bilateral costophrenic angles are clear. Normal cardio-mediastinal silhouette. Aortic arch calcifications noted. No acute osseous abnormalities. The soft tissues are within normal limits. IMPRESSION: 1. No active disease. Aortic Atherosclerosis (ICD10-I70.0). Electronically Signed   By: Jules Schick M.D.   On: 02/01/2023 17:15   CT ABDOMEN PELVIS W CONTRAST  Result Date: 01/28/2023 CLINICAL DATA:  Severe acute pancreatitis EXAM: CT ABDOMEN AND PELVIS WITH CONTRAST TECHNIQUE: Multidetector CT imaging of the abdomen and pelvis was performed using the standard protocol following bolus administration of intravenous contrast. RADIATION DOSE REDUCTION: This exam was performed according to the departmental dose-optimization program which includes automated exposure control, adjustment of the mA and/or kV according to patient size and/or use of iterative reconstruction technique. CONTRAST:  75mL OMNIPAQUE IOHEXOL 350 MG/ML SOLN COMPARISON:  Previous studies including the examination of 01/24/2023 FINDINGS: Lower chest: Visualized lower lung fields are clear. Coronary artery calcifications are seen. Hepatobiliary: Surgical  clips are seen in gallbladder fossa. There are pockets of air in intrahepatic bile ducts. Bile ducts are not dilated. Pancreas: There is enlargement of the head of the pancreas. There are multiple loculated fluid collections in and adjacent to head and proximal body of pancreas. There is interval increase in number and size of the loculated fluid collections in and around head of the pancreas due to severe acute pancreatitis. Largest of these collections measures 4.9 x 3.5 cm in the anterior aspect of the head. There are multiple other smaller loculated fluid collections. Overall size body and tail of pancreas has not changed. There is homogeneous enhancement in body and tail of pancreas. There is 2.2 cm low-density in the anterior margin of tail of pancreas with no significant change. Spleen: Unremarkable. Adrenals/Urinary Tract: Adrenals are unremarkable. There is no hydronephrosis. There are no renal or ureteral stones. Urinary bladder is not distended. Stomach/Bowel: There is wall thickening in the antrum of the stomach and duodenum. Rest of the small bowel loops are unremarkable. The appendix is not dilated. There is no significant wall thickening in colon. Multiple diverticula are seen in the colon, especially in sigmoid with no evidence of focal acute diverticulitis. Vascular/Lymphatic: Calcifications are seen in aorta and its major branches. Reproductive: Unremarkable. Other: There is no ascites or pneumoperitoneum. Umbilical hernia containing fat is seen. Right inguinal hernia containing fat is seen. Possible small left inguinal hernia containing fat is seen. Musculoskeletal: Degenerative changes are noted in lumbar spine, more severe at L2-L3 level. There is encroachment of neural foramina at multiple levels.  IMPRESSION: Severe acute pancreatitis in the head of the pancreas. There is interval increase in number and size of multiple loculated fluid collections in and around head of the pancreas suggesting  interval worsening of acute pancreatitis with possible multiple pseudocysts or foci of pancreatic necrosis. Largest of these fluid collections measures 4.9 x 3.5 cm. There are no pockets of air within these fluid collections. There is a 2.2 cm low-density in the tail of pancreas with no interval change. There is no definite demonstrable new focal pancreatic necrosis in the body and tail. There is wall thickening in the distal antrum of the stomach and the duodenum, possibly gastritis and duodenitis There is no evidence of intestinal obstruction or pneumoperitoneum. Appendix is not dilated. There is no hydronephrosis. Diverticulosis of colon without signs of focal diverticulitis. Aortic arteriosclerosis. Coronary artery disease. Lumbar spondylosis. Electronically Signed   By: Ernie Avena M.D.   On: 01/28/2023 20:18      Subjective: Patient seen and examined at bedside today.  Hemodynamically stable comfortably sitting in the chair.  No abdomen pain, nausea or vomiting.  He had some abdominal pain last night which has resolved.  Abdomen is soft and nondistended.  Had a long discussion about discharge planning along with the wife at bedside.    Discharge Exam: Vitals:   02/26/23 0358 02/26/23 0734  BP: 117/82 137/83  Pulse: 86 90  Resp: 18 16  Temp: 98 F (36.7 C) 97.8 F (36.6 C)  SpO2: 94% 98%   Vitals:   02/25/23 2012 02/26/23 0358 02/26/23 0403 02/26/23 0734  BP: (!) 119/57 117/82  137/83  Pulse: 88 86  90  Resp: 18 18  16   Temp: 98 F (36.7 C) 98 F (36.7 C)  97.8 F (36.6 C)  TempSrc: Oral Oral  Oral  SpO2: 98% 94%  98%  Weight:   69.8 kg   Height:        General: Pt is alert, awake, not in acute distress Cardiovascular: RRR, S1/S2 +, no rubs, no gallops Respiratory: CTA bilaterally, no wheezing, no rhonchi Abdominal: Soft, NT, ND, bowel sounds + Extremities: no edema, no cyanosis    The results of significant diagnostics from this hospitalization (including  imaging, microbiology, ancillary and laboratory) are listed below for reference.     Microbiology: No results found for this or any previous visit (from the past 240 hour(s)).   Labs: BNP (last 3 results) No results for input(s): "BNP" in the last 8760 hours. Basic Metabolic Panel: Recent Labs  Lab 02/20/23 1146 02/21/23 0405 02/22/23 0340 02/23/23 0317 02/25/23 0356 02/26/23 0353  NA 136 135 133* 132* 136 134*  K 4.5 4.3 4.2 4.0 4.3 4.3  CL 101 102 99 99 102 100  CO2 25 24 23 24 28 26   GLUCOSE 269* 141* 196* 170* 179* 163*  BUN 24* 21 21 20 22 21   CREATININE 0.66 0.67 0.69 0.65 0.73 0.62  CALCIUM 8.2* 8.1* 8.1* 7.9* 8.1* 7.9*  MG 2.1 2.3 2.2 1.9 2.1  --   PHOS 2.5 3.1 3.2  --  3.8  --    Liver Function Tests: Recent Labs  Lab 02/21/23 0405 02/22/23 0340 02/26/23 0353  AST 27 29 30   ALT 44 44 37  ALKPHOS 65 58 58  BILITOT 0.5 0.5 0.6  PROT 6.4* 6.4* 5.8*  ALBUMIN 2.5* 2.5* 2.2*   No results for input(s): "LIPASE", "AMYLASE" in the last 168 hours. No results for input(s): "AMMONIA" in the last 168 hours. CBC:  Recent Labs  Lab 02/20/23 0207 02/21/23 0405 02/22/23 0340 02/23/23 0317  WBC 11.4* 11.6* 10.9* 10.9*  HGB 10.0* 10.6* 10.2* 9.9*  HCT 31.1* 33.7* 32.3* 31.1*  MCV 87.9 86.9 87.1 88.9  PLT 320 307 288 234   Cardiac Enzymes: No results for input(s): "CKTOTAL", "CKMB", "CKMBINDEX", "TROPONINI" in the last 168 hours. BNP: Invalid input(s): "POCBNP" CBG: Recent Labs  Lab 02/25/23 0744 02/25/23 1132 02/25/23 1627 02/25/23 2006 02/26/23 0736  GLUCAP 196* 200* 135* 118* 140*   D-Dimer No results for input(s): "DDIMER" in the last 72 hours. Hgb A1c No results for input(s): "HGBA1C" in the last 72 hours. Lipid Profile No results for input(s): "CHOL", "HDL", "LDLCALC", "TRIG", "CHOLHDL", "LDLDIRECT" in the last 72 hours. Thyroid function studies No results for input(s): "TSH", "T4TOTAL", "T3FREE", "THYROIDAB" in the last 72 hours.  Invalid  input(s): "FREET3" Anemia work up No results for input(s): "VITAMINB12", "FOLATE", "FERRITIN", "TIBC", "IRON", "RETICCTPCT" in the last 72 hours. Urinalysis    Component Value Date/Time   COLORURINE AMBER (A) 01/28/2023 1312   APPEARANCEUR HAZY (A) 01/28/2023 1312   APPEARANCEUR Clear 08/28/2021 1113   LABSPEC 1.031 (H) 01/28/2023 1312   PHURINE 5.0 01/28/2023 1312   GLUCOSEU NEGATIVE 01/28/2023 1312   HGBUR NEGATIVE 01/28/2023 1312   BILIRUBINUR NEGATIVE 01/28/2023 1312   BILIRUBINUR Negative 08/28/2021 1113   KETONESUR NEGATIVE 01/28/2023 1312   PROTEINUR 100 (A) 01/28/2023 1312   NITRITE NEGATIVE 01/28/2023 1312   LEUKOCYTESUR NEGATIVE 01/28/2023 1312   Sepsis Labs Recent Labs  Lab 02/20/23 0207 02/21/23 0405 02/22/23 0340 02/23/23 0317  WBC 11.4* 11.6* 10.9* 10.9*   Microbiology No results found for this or any previous visit (from the past 240 hour(s)).  Please note: You were cared for by a hospitalist during your hospital stay. Once you are discharged, your primary care physician will handle any further medical issues. Please note that NO REFILLS for any discharge medications will be authorized once you are discharged, as it is imperative that you return to your primary care physician (or establish a relationship with a primary care physician if you do not have one) for your post hospital discharge needs so that they can reassess your need for medications and monitor your lab values.    Time coordinating discharge: 40 minutes  SIGNED:   Burnadette Pop, MD  [facility] 02/26/2023, 10:39 AM Pager [phone]  If 7PM-7AM, please contact night-coverage [email or web address] Password [email or web address]

## 2023-02-26 NOTE — Plan of Care (Signed)

## 2023-02-26 NOTE — TOC Transition Note (Signed)
Transition of Care [facility]) - CM/SW Discharge Note   Patient Details  Name: Jon Bray MRN: [id number] Date of Birth: 05-Oct-1940  [facility] CM/SW Contact:  Lawerance Sabal, RN Phone Number: 02/26/2023, 10:57 AM   Clinical Narrative:     Notified [facility] liaison that patient will DC to home. Tube feeds have been DC'd.    Final next level of care: Home w Home Health Services Barriers to Discharge: No Barriers Identified   Patient Goals and CMS Choice      Discharge Placement                         Discharge Plan and Services Additional resources added to the After Visit Summary for     Discharge Planning Services: CM Consult              DME Agency: [facility] Date DME Agency Contacted: 02/26/23 Time DME Agency Contacted: 1057 Representative spoke with at DME Agency: Haywood Lasso            Social Determinants of Health (SDOH) Interventions SDOH Screenings   Food Insecurity: No Food Insecurity (02/12/2023)  Housing: Low Risk  (02/12/2023)  Transportation Needs: No Transportation Needs (02/12/2023)  Utilities: Not At Risk (02/12/2023)  Depression (PHQ2-9): Low Risk  (11/17/2022)  Financial Resource Strain: Low Risk  (11/17/2022)  Physical Activity: Sufficiently Active (11/17/2022)  Social Connections: Socially Integrated (11/17/2022)  Stress: No Stress Concern Present (11/17/2022)  Tobacco Use: Low Risk  (02/21/2023)   Received from [facility]  Recent Concern: Tobacco Use - Medium Risk (01/25/2023)   Received from [facility]     Readmission Risk Interventions     No data to display

## 2023-02-26 NOTE — Progress Notes (Signed)
Patient and wife reported of increase pain level in the RUQ. Per the wife and the patient, he was not in as much pain for the last couple of days. Patient and wife are concerned and requested for a scan/imaging done. Jon Sera, MD was notified.  PRN morphine was administered.    Sorina Derrig

## 2023-02-27 DIAGNOSIS — R69 Illness, unspecified: Secondary | ICD-10-CM | POA: Diagnosis not present

## 2023-03-01 DIAGNOSIS — R1084 Generalized abdominal pain: Secondary | ICD-10-CM | POA: Diagnosis not present

## 2023-03-01 DIAGNOSIS — K9189 Other postprocedural complications and disorders of digestive system: Secondary | ICD-10-CM | POA: Diagnosis not present

## 2023-03-01 DIAGNOSIS — E1151 Type 2 diabetes mellitus with diabetic peripheral angiopathy without gangrene: Secondary | ICD-10-CM | POA: Diagnosis not present

## 2023-03-01 DIAGNOSIS — R109 Unspecified abdominal pain: Secondary | ICD-10-CM | POA: Diagnosis not present

## 2023-03-01 DIAGNOSIS — K311 Adult hypertrophic pyloric stenosis: Secondary | ICD-10-CM | POA: Diagnosis not present

## 2023-03-01 DIAGNOSIS — E440 Moderate protein-calorie malnutrition: Secondary | ICD-10-CM | POA: Diagnosis not present

## 2023-03-01 DIAGNOSIS — I482 Chronic atrial fibrillation, unspecified: Secondary | ICD-10-CM | POA: Diagnosis not present

## 2023-03-01 DIAGNOSIS — K863 Pseudocyst of pancreas: Secondary | ICD-10-CM | POA: Diagnosis not present

## 2023-03-02 DIAGNOSIS — I2510 Atherosclerotic heart disease of native coronary artery without angina pectoris: Secondary | ICD-10-CM | POA: Diagnosis not present

## 2023-03-02 DIAGNOSIS — E1122 Type 2 diabetes mellitus with diabetic chronic kidney disease: Secondary | ICD-10-CM | POA: Diagnosis not present

## 2023-03-02 DIAGNOSIS — Z978 Presence of other specified devices: Secondary | ICD-10-CM | POA: Diagnosis not present

## 2023-03-02 DIAGNOSIS — K859 Acute pancreatitis without necrosis or infection, unspecified: Secondary | ICD-10-CM | POA: Diagnosis not present

## 2023-03-02 DIAGNOSIS — I129 Hypertensive chronic kidney disease with stage 1 through stage 4 chronic kidney disease, or unspecified chronic kidney disease: Secondary | ICD-10-CM | POA: Diagnosis not present

## 2023-03-02 DIAGNOSIS — Z7982 Long term (current) use of aspirin: Secondary | ICD-10-CM | POA: Diagnosis not present

## 2023-03-02 DIAGNOSIS — Z6822 Body mass index (BMI) 22.0-22.9, adult: Secondary | ICD-10-CM | POA: Diagnosis not present

## 2023-03-02 DIAGNOSIS — K9189 Other postprocedural complications and disorders of digestive system: Secondary | ICD-10-CM | POA: Diagnosis not present

## 2023-03-02 DIAGNOSIS — K5790 Diverticulosis of intestine, part unspecified, without perforation or abscess without bleeding: Secondary | ICD-10-CM | POA: Diagnosis not present

## 2023-03-02 DIAGNOSIS — K8689 Other specified diseases of pancreas: Secondary | ICD-10-CM | POA: Diagnosis not present

## 2023-03-02 DIAGNOSIS — Z431 Encounter for attention to gastrostomy: Secondary | ICD-10-CM | POA: Diagnosis not present

## 2023-03-02 DIAGNOSIS — I482 Chronic atrial fibrillation, unspecified: Secondary | ICD-10-CM | POA: Diagnosis not present

## 2023-03-02 DIAGNOSIS — R54 Age-related physical debility: Secondary | ICD-10-CM | POA: Diagnosis not present

## 2023-03-02 DIAGNOSIS — K861 Other chronic pancreatitis: Secondary | ICD-10-CM | POA: Diagnosis not present

## 2023-03-02 DIAGNOSIS — N189 Chronic kidney disease, unspecified: Secondary | ICD-10-CM | POA: Diagnosis not present

## 2023-03-02 DIAGNOSIS — K2980 Duodenitis without bleeding: Secondary | ICD-10-CM | POA: Diagnosis not present

## 2023-03-02 DIAGNOSIS — R6881 Early satiety: Secondary | ICD-10-CM | POA: Diagnosis not present

## 2023-03-02 DIAGNOSIS — R6339 Other feeding difficulties: Secondary | ICD-10-CM | POA: Diagnosis not present

## 2023-03-02 DIAGNOSIS — R188 Other ascites: Secondary | ICD-10-CM | POA: Diagnosis not present

## 2023-03-02 DIAGNOSIS — E46 Unspecified protein-calorie malnutrition: Secondary | ICD-10-CM | POA: Diagnosis not present

## 2023-03-02 DIAGNOSIS — K863 Pseudocyst of pancreas: Secondary | ICD-10-CM | POA: Diagnosis not present

## 2023-03-02 DIAGNOSIS — K5900 Constipation, unspecified: Secondary | ICD-10-CM | POA: Diagnosis not present

## 2023-03-02 DIAGNOSIS — R627 Adult failure to thrive: Secondary | ICD-10-CM | POA: Diagnosis not present

## 2023-03-02 DIAGNOSIS — Z9049 Acquired absence of other specified parts of digestive tract: Secondary | ICD-10-CM | POA: Diagnosis not present

## 2023-03-02 DIAGNOSIS — N329 Bladder disorder, unspecified: Secondary | ICD-10-CM | POA: Diagnosis not present

## 2023-03-02 DIAGNOSIS — E43 Unspecified severe protein-calorie malnutrition: Secondary | ICD-10-CM | POA: Diagnosis not present

## 2023-03-02 DIAGNOSIS — Z7984 Long term (current) use of oral hypoglycemic drugs: Secondary | ICD-10-CM | POA: Diagnosis not present

## 2023-03-02 DIAGNOSIS — K851 Biliary acute pancreatitis without necrosis or infection: Secondary | ICD-10-CM | POA: Diagnosis not present

## 2023-03-02 DIAGNOSIS — E785 Hyperlipidemia, unspecified: Secondary | ICD-10-CM | POA: Diagnosis not present

## 2023-03-02 DIAGNOSIS — R1084 Generalized abdominal pain: Secondary | ICD-10-CM | POA: Diagnosis not present

## 2023-03-02 DIAGNOSIS — D649 Anemia, unspecified: Secondary | ICD-10-CM | POA: Diagnosis not present

## 2023-03-02 DIAGNOSIS — K219 Gastro-esophageal reflux disease without esophagitis: Secondary | ICD-10-CM | POA: Diagnosis not present

## 2023-03-03 DIAGNOSIS — K851 Biliary acute pancreatitis without necrosis or infection: Secondary | ICD-10-CM | POA: Diagnosis not present

## 2023-03-03 DIAGNOSIS — K863 Pseudocyst of pancreas: Secondary | ICD-10-CM | POA: Diagnosis not present

## 2023-03-08 DIAGNOSIS — E43 Unspecified severe protein-calorie malnutrition: Secondary | ICD-10-CM | POA: Diagnosis not present

## 2023-03-08 DIAGNOSIS — E119 Type 2 diabetes mellitus without complications: Secondary | ICD-10-CM | POA: Diagnosis not present

## 2023-03-08 DIAGNOSIS — I4891 Unspecified atrial fibrillation: Secondary | ICD-10-CM | POA: Diagnosis not present

## 2023-03-08 DIAGNOSIS — R0902 Hypoxemia: Secondary | ICD-10-CM | POA: Diagnosis not present

## 2023-03-09 DIAGNOSIS — Z980 Intestinal bypass and anastomosis status: Secondary | ICD-10-CM | POA: Diagnosis not present

## 2023-03-09 DIAGNOSIS — Z955 Presence of coronary angioplasty implant and graft: Secondary | ICD-10-CM | POA: Diagnosis not present

## 2023-03-09 DIAGNOSIS — I110 Hypertensive heart disease with heart failure: Secondary | ICD-10-CM | POA: Diagnosis not present

## 2023-03-09 DIAGNOSIS — Z931 Gastrostomy status: Secondary | ICD-10-CM | POA: Diagnosis not present

## 2023-03-09 DIAGNOSIS — R2681 Unsteadiness on feet: Secondary | ICD-10-CM | POA: Diagnosis not present

## 2023-03-09 DIAGNOSIS — E871 Hypo-osmolality and hyponatremia: Secondary | ICD-10-CM | POA: Diagnosis not present

## 2023-03-09 DIAGNOSIS — I071 Rheumatic tricuspid insufficiency: Secondary | ICD-10-CM | POA: Diagnosis not present

## 2023-03-09 DIAGNOSIS — I4891 Unspecified atrial fibrillation: Secondary | ICD-10-CM | POA: Diagnosis not present

## 2023-03-09 DIAGNOSIS — I10 Essential (primary) hypertension: Secondary | ICD-10-CM | POA: Diagnosis not present

## 2023-03-09 DIAGNOSIS — R600 Localized edema: Secondary | ICD-10-CM | POA: Diagnosis not present

## 2023-03-09 DIAGNOSIS — K863 Pseudocyst of pancreas: Secondary | ICD-10-CM | POA: Diagnosis not present

## 2023-03-09 DIAGNOSIS — I5020 Unspecified systolic (congestive) heart failure: Secondary | ICD-10-CM | POA: Diagnosis not present

## 2023-03-09 DIAGNOSIS — H04129 Dry eye syndrome of unspecified lacrimal gland: Secondary | ICD-10-CM | POA: Diagnosis not present

## 2023-03-09 DIAGNOSIS — K651 Peritoneal abscess: Secondary | ICD-10-CM | POA: Diagnosis not present

## 2023-03-09 DIAGNOSIS — I252 Old myocardial infarction: Secondary | ICD-10-CM | POA: Diagnosis not present

## 2023-03-09 DIAGNOSIS — Z452 Encounter for adjustment and management of vascular access device: Secondary | ICD-10-CM | POA: Diagnosis not present

## 2023-03-09 DIAGNOSIS — I2510 Atherosclerotic heart disease of native coronary artery without angina pectoris: Secondary | ICD-10-CM | POA: Diagnosis not present

## 2023-03-09 DIAGNOSIS — R1011 Right upper quadrant pain: Secondary | ICD-10-CM | POA: Diagnosis not present

## 2023-03-09 DIAGNOSIS — K5900 Constipation, unspecified: Secondary | ICD-10-CM | POA: Diagnosis not present

## 2023-03-09 DIAGNOSIS — K219 Gastro-esophageal reflux disease without esophagitis: Secondary | ICD-10-CM | POA: Diagnosis not present

## 2023-03-09 DIAGNOSIS — D72829 Elevated white blood cell count, unspecified: Secondary | ICD-10-CM | POA: Diagnosis not present

## 2023-03-09 DIAGNOSIS — K8511 Biliary acute pancreatitis with uninfected necrosis: Secondary | ICD-10-CM | POA: Diagnosis not present

## 2023-03-09 DIAGNOSIS — K7689 Other specified diseases of liver: Secondary | ICD-10-CM | POA: Diagnosis not present

## 2023-03-09 DIAGNOSIS — Z79899 Other long term (current) drug therapy: Secondary | ICD-10-CM | POA: Diagnosis not present

## 2023-03-09 DIAGNOSIS — Z934 Other artificial openings of gastrointestinal tract status: Secondary | ICD-10-CM | POA: Diagnosis not present

## 2023-03-09 DIAGNOSIS — E1122 Type 2 diabetes mellitus with diabetic chronic kidney disease: Secondary | ICD-10-CM | POA: Diagnosis not present

## 2023-03-09 DIAGNOSIS — E43 Unspecified severe protein-calorie malnutrition: Secondary | ICD-10-CM | POA: Diagnosis not present

## 2023-03-09 DIAGNOSIS — I129 Hypertensive chronic kidney disease with stage 1 through stage 4 chronic kidney disease, or unspecified chronic kidney disease: Secondary | ICD-10-CM | POA: Diagnosis not present

## 2023-03-09 DIAGNOSIS — Q393 Congenital stenosis and stricture of esophagus: Secondary | ICD-10-CM | POA: Diagnosis not present

## 2023-03-09 DIAGNOSIS — I5021 Acute systolic (congestive) heart failure: Secondary | ICD-10-CM | POA: Diagnosis not present

## 2023-03-09 DIAGNOSIS — E440 Moderate protein-calorie malnutrition: Secondary | ICD-10-CM | POA: Diagnosis not present

## 2023-03-09 DIAGNOSIS — D638 Anemia in other chronic diseases classified elsewhere: Secondary | ICD-10-CM | POA: Diagnosis not present

## 2023-03-09 DIAGNOSIS — E785 Hyperlipidemia, unspecified: Secondary | ICD-10-CM | POA: Diagnosis not present

## 2023-03-09 DIAGNOSIS — I480 Paroxysmal atrial fibrillation: Secondary | ICD-10-CM | POA: Diagnosis not present

## 2023-03-09 DIAGNOSIS — E569 Vitamin deficiency, unspecified: Secondary | ICD-10-CM | POA: Diagnosis not present

## 2023-03-09 DIAGNOSIS — K8689 Other specified diseases of pancreas: Secondary | ICD-10-CM | POA: Diagnosis not present

## 2023-03-09 DIAGNOSIS — Z7901 Long term (current) use of anticoagulants: Secondary | ICD-10-CM | POA: Diagnosis not present

## 2023-03-09 DIAGNOSIS — R4789 Other speech disturbances: Secondary | ICD-10-CM | POA: Diagnosis not present

## 2023-03-09 DIAGNOSIS — K851 Biliary acute pancreatitis without necrosis or infection: Secondary | ICD-10-CM | POA: Diagnosis not present

## 2023-03-09 DIAGNOSIS — R638 Other symptoms and signs concerning food and fluid intake: Secondary | ICD-10-CM | POA: Diagnosis not present

## 2023-03-09 DIAGNOSIS — J90 Pleural effusion, not elsewhere classified: Secondary | ICD-10-CM | POA: Diagnosis not present

## 2023-03-09 DIAGNOSIS — R6339 Other feeding difficulties: Secondary | ICD-10-CM | POA: Diagnosis not present

## 2023-03-09 DIAGNOSIS — I4819 Other persistent atrial fibrillation: Secondary | ICD-10-CM | POA: Diagnosis not present

## 2023-03-09 DIAGNOSIS — K8500 Idiopathic acute pancreatitis without necrosis or infection: Secondary | ICD-10-CM | POA: Diagnosis not present

## 2023-03-09 DIAGNOSIS — R109 Unspecified abdominal pain: Secondary | ICD-10-CM | POA: Diagnosis not present

## 2023-03-09 DIAGNOSIS — U071 COVID-19: Secondary | ICD-10-CM | POA: Diagnosis not present

## 2023-03-09 DIAGNOSIS — R188 Other ascites: Secondary | ICD-10-CM | POA: Diagnosis not present

## 2023-03-09 DIAGNOSIS — K838 Other specified diseases of biliary tract: Secondary | ICD-10-CM | POA: Diagnosis not present

## 2023-03-09 DIAGNOSIS — K859 Acute pancreatitis without necrosis or infection, unspecified: Secondary | ICD-10-CM | POA: Diagnosis not present

## 2023-03-09 DIAGNOSIS — J9811 Atelectasis: Secondary | ICD-10-CM | POA: Diagnosis not present

## 2023-03-09 DIAGNOSIS — D631 Anemia in chronic kidney disease: Secondary | ICD-10-CM | POA: Diagnosis not present

## 2023-03-09 DIAGNOSIS — Z4659 Encounter for fitting and adjustment of other gastrointestinal appliance and device: Secondary | ICD-10-CM | POA: Diagnosis not present

## 2023-03-09 DIAGNOSIS — I5023 Acute on chronic systolic (congestive) heart failure: Secondary | ICD-10-CM | POA: Diagnosis not present

## 2023-03-09 DIAGNOSIS — R0902 Hypoxemia: Secondary | ICD-10-CM | POA: Diagnosis not present

## 2023-03-09 DIAGNOSIS — R7402 Elevation of levels of lactic acid dehydrogenase (LDH): Secondary | ICD-10-CM | POA: Diagnosis not present

## 2023-03-09 DIAGNOSIS — I50813 Acute on chronic right heart failure: Secondary | ICD-10-CM | POA: Diagnosis not present

## 2023-03-09 DIAGNOSIS — K861 Other chronic pancreatitis: Secondary | ICD-10-CM | POA: Diagnosis not present

## 2023-03-09 DIAGNOSIS — R1312 Dysphagia, oropharyngeal phase: Secondary | ICD-10-CM | POA: Diagnosis not present

## 2023-03-09 DIAGNOSIS — I130 Hypertensive heart and chronic kidney disease with heart failure and stage 1 through stage 4 chronic kidney disease, or unspecified chronic kidney disease: Secondary | ICD-10-CM | POA: Diagnosis not present

## 2023-03-09 DIAGNOSIS — R627 Adult failure to thrive: Secondary | ICD-10-CM | POA: Diagnosis not present

## 2023-03-09 DIAGNOSIS — N189 Chronic kidney disease, unspecified: Secondary | ICD-10-CM | POA: Diagnosis not present

## 2023-03-09 DIAGNOSIS — K831 Obstruction of bile duct: Secondary | ICD-10-CM | POA: Diagnosis not present

## 2023-03-09 DIAGNOSIS — E8770 Fluid overload, unspecified: Secondary | ICD-10-CM | POA: Diagnosis not present

## 2023-03-09 DIAGNOSIS — K222 Esophageal obstruction: Secondary | ICD-10-CM | POA: Diagnosis not present

## 2023-03-09 DIAGNOSIS — K5790 Diverticulosis of intestine, part unspecified, without perforation or abscess without bleeding: Secondary | ICD-10-CM | POA: Diagnosis not present

## 2023-03-09 DIAGNOSIS — M6259 Muscle wasting and atrophy, not elsewhere classified, multiple sites: Secondary | ICD-10-CM | POA: Diagnosis not present

## 2023-03-09 DIAGNOSIS — K259 Gastric ulcer, unspecified as acute or chronic, without hemorrhage or perforation: Secondary | ICD-10-CM | POA: Diagnosis not present

## 2023-03-09 DIAGNOSIS — D649 Anemia, unspecified: Secondary | ICD-10-CM | POA: Diagnosis not present

## 2023-03-09 DIAGNOSIS — Z794 Long term (current) use of insulin: Secondary | ICD-10-CM | POA: Diagnosis not present

## 2023-03-09 DIAGNOSIS — E119 Type 2 diabetes mellitus without complications: Secondary | ICD-10-CM | POA: Diagnosis not present

## 2023-03-09 DIAGNOSIS — K858 Other acute pancreatitis without necrosis or infection: Secondary | ICD-10-CM | POA: Diagnosis not present

## 2023-03-09 DIAGNOSIS — I255 Ischemic cardiomyopathy: Secondary | ICD-10-CM | POA: Diagnosis not present

## 2023-03-09 DIAGNOSIS — Z9049 Acquired absence of other specified parts of digestive tract: Secondary | ICD-10-CM | POA: Diagnosis not present

## 2023-03-09 DIAGNOSIS — K5730 Diverticulosis of large intestine without perforation or abscess without bleeding: Secondary | ICD-10-CM | POA: Diagnosis not present

## 2023-03-10 DIAGNOSIS — K863 Pseudocyst of pancreas: Secondary | ICD-10-CM | POA: Diagnosis not present

## 2023-03-10 DIAGNOSIS — Z452 Encounter for adjustment and management of vascular access device: Secondary | ICD-10-CM | POA: Diagnosis not present

## 2023-03-10 DIAGNOSIS — K859 Acute pancreatitis without necrosis or infection, unspecified: Secondary | ICD-10-CM | POA: Diagnosis not present

## 2023-03-10 DIAGNOSIS — K8689 Other specified diseases of pancreas: Secondary | ICD-10-CM | POA: Diagnosis not present

## 2023-03-10 DIAGNOSIS — Z931 Gastrostomy status: Secondary | ICD-10-CM | POA: Diagnosis not present

## 2023-03-10 DIAGNOSIS — I071 Rheumatic tricuspid insufficiency: Secondary | ICD-10-CM | POA: Diagnosis not present

## 2023-03-10 DIAGNOSIS — K8500 Idiopathic acute pancreatitis without necrosis or infection: Secondary | ICD-10-CM | POA: Diagnosis not present

## 2023-03-10 DIAGNOSIS — R6339 Other feeding difficulties: Secondary | ICD-10-CM | POA: Diagnosis not present

## 2023-03-10 DIAGNOSIS — K858 Other acute pancreatitis without necrosis or infection: Secondary | ICD-10-CM | POA: Diagnosis not present

## 2023-03-23 DIAGNOSIS — E119 Type 2 diabetes mellitus without complications: Secondary | ICD-10-CM | POA: Diagnosis not present

## 2023-03-23 DIAGNOSIS — K861 Other chronic pancreatitis: Secondary | ICD-10-CM | POA: Diagnosis not present

## 2023-03-23 DIAGNOSIS — I2510 Atherosclerotic heart disease of native coronary artery without angina pectoris: Secondary | ICD-10-CM | POA: Diagnosis not present

## 2023-03-23 DIAGNOSIS — K859 Acute pancreatitis without necrosis or infection, unspecified: Secondary | ICD-10-CM | POA: Diagnosis not present

## 2023-03-23 DIAGNOSIS — Z4682 Encounter for fitting and adjustment of non-vascular catheter: Secondary | ICD-10-CM | POA: Diagnosis not present

## 2023-03-23 DIAGNOSIS — I255 Ischemic cardiomyopathy: Secondary | ICD-10-CM | POA: Diagnosis not present

## 2023-03-23 DIAGNOSIS — I480 Paroxysmal atrial fibrillation: Secondary | ICD-10-CM | POA: Diagnosis not present

## 2023-03-23 DIAGNOSIS — Z961 Presence of intraocular lens: Secondary | ICD-10-CM | POA: Diagnosis not present

## 2023-03-23 DIAGNOSIS — D638 Anemia in other chronic diseases classified elsewhere: Secondary | ICD-10-CM | POA: Diagnosis not present

## 2023-03-23 DIAGNOSIS — K219 Gastro-esophageal reflux disease without esophagitis: Secondary | ICD-10-CM | POA: Diagnosis not present

## 2023-03-23 DIAGNOSIS — Z931 Gastrostomy status: Secondary | ICD-10-CM | POA: Diagnosis not present

## 2023-03-23 DIAGNOSIS — E1122 Type 2 diabetes mellitus with diabetic chronic kidney disease: Secondary | ICD-10-CM | POA: Diagnosis not present

## 2023-03-23 DIAGNOSIS — H04129 Dry eye syndrome of unspecified lacrimal gland: Secondary | ICD-10-CM | POA: Diagnosis not present

## 2023-03-23 DIAGNOSIS — I10 Essential (primary) hypertension: Secondary | ICD-10-CM | POA: Diagnosis not present

## 2023-03-23 DIAGNOSIS — R627 Adult failure to thrive: Secondary | ICD-10-CM | POA: Diagnosis not present

## 2023-03-23 DIAGNOSIS — Z9689 Presence of other specified functional implants: Secondary | ICD-10-CM | POA: Diagnosis not present

## 2023-03-23 DIAGNOSIS — K8592 Acute pancreatitis with infected necrosis, unspecified: Secondary | ICD-10-CM | POA: Diagnosis not present

## 2023-03-23 DIAGNOSIS — Z79899 Other long term (current) drug therapy: Secondary | ICD-10-CM | POA: Diagnosis not present

## 2023-03-23 DIAGNOSIS — E43 Unspecified severe protein-calorie malnutrition: Secondary | ICD-10-CM | POA: Diagnosis not present

## 2023-03-23 DIAGNOSIS — R1312 Dysphagia, oropharyngeal phase: Secondary | ICD-10-CM | POA: Diagnosis not present

## 2023-03-23 DIAGNOSIS — H6121 Impacted cerumen, right ear: Secondary | ICD-10-CM | POA: Diagnosis not present

## 2023-03-23 DIAGNOSIS — I4891 Unspecified atrial fibrillation: Secondary | ICD-10-CM | POA: Diagnosis not present

## 2023-03-23 DIAGNOSIS — E569 Vitamin deficiency, unspecified: Secondary | ICD-10-CM | POA: Diagnosis not present

## 2023-03-23 DIAGNOSIS — D72829 Elevated white blood cell count, unspecified: Secondary | ICD-10-CM | POA: Diagnosis not present

## 2023-03-23 DIAGNOSIS — Z7901 Long term (current) use of anticoagulants: Secondary | ICD-10-CM | POA: Diagnosis not present

## 2023-03-23 DIAGNOSIS — R490 Dysphonia: Secondary | ICD-10-CM | POA: Diagnosis not present

## 2023-03-23 DIAGNOSIS — R2681 Unsteadiness on feet: Secondary | ICD-10-CM | POA: Diagnosis not present

## 2023-03-23 DIAGNOSIS — N189 Chronic kidney disease, unspecified: Secondary | ICD-10-CM | POA: Diagnosis not present

## 2023-03-23 DIAGNOSIS — E440 Moderate protein-calorie malnutrition: Secondary | ICD-10-CM | POA: Diagnosis not present

## 2023-03-23 DIAGNOSIS — K8689 Other specified diseases of pancreas: Secondary | ICD-10-CM | POA: Diagnosis not present

## 2023-03-23 DIAGNOSIS — J3801 Paralysis of vocal cords and larynx, unilateral: Secondary | ICD-10-CM | POA: Diagnosis not present

## 2023-03-23 DIAGNOSIS — K863 Pseudocyst of pancreas: Secondary | ICD-10-CM | POA: Diagnosis not present

## 2023-03-23 DIAGNOSIS — K831 Obstruction of bile duct: Secondary | ICD-10-CM | POA: Diagnosis not present

## 2023-03-23 DIAGNOSIS — R4789 Other speech disturbances: Secondary | ICD-10-CM | POA: Diagnosis not present

## 2023-03-23 DIAGNOSIS — K9429 Other complications of gastrostomy: Secondary | ICD-10-CM | POA: Diagnosis not present

## 2023-03-23 DIAGNOSIS — M6259 Muscle wasting and atrophy, not elsewhere classified, multiple sites: Secondary | ICD-10-CM | POA: Diagnosis not present

## 2023-03-24 DIAGNOSIS — E43 Unspecified severe protein-calorie malnutrition: Secondary | ICD-10-CM | POA: Diagnosis not present

## 2023-03-24 DIAGNOSIS — K8592 Acute pancreatitis with infected necrosis, unspecified: Secondary | ICD-10-CM | POA: Diagnosis not present

## 2023-03-28 DIAGNOSIS — E43 Unspecified severe protein-calorie malnutrition: Secondary | ICD-10-CM | POA: Diagnosis not present

## 2023-03-28 DIAGNOSIS — I480 Paroxysmal atrial fibrillation: Secondary | ICD-10-CM | POA: Diagnosis not present

## 2023-03-28 DIAGNOSIS — K8592 Acute pancreatitis with infected necrosis, unspecified: Secondary | ICD-10-CM | POA: Diagnosis not present

## 2023-03-31 DIAGNOSIS — K8592 Acute pancreatitis with infected necrosis, unspecified: Secondary | ICD-10-CM | POA: Diagnosis not present

## 2023-03-31 DIAGNOSIS — I480 Paroxysmal atrial fibrillation: Secondary | ICD-10-CM | POA: Diagnosis not present

## 2023-04-05 ENCOUNTER — Other Ambulatory Visit: Payer: Self-pay | Admitting: Otolaryngology

## 2023-04-05 DIAGNOSIS — J3801 Paralysis of vocal cords and larynx, unilateral: Secondary | ICD-10-CM | POA: Diagnosis not present

## 2023-04-05 DIAGNOSIS — Z961 Presence of intraocular lens: Secondary | ICD-10-CM | POA: Diagnosis not present

## 2023-04-05 DIAGNOSIS — J3800 Paralysis of vocal cords and larynx, unspecified: Secondary | ICD-10-CM

## 2023-04-05 DIAGNOSIS — R490 Dysphonia: Secondary | ICD-10-CM | POA: Diagnosis not present

## 2023-04-05 DIAGNOSIS — E119 Type 2 diabetes mellitus without complications: Secondary | ICD-10-CM | POA: Diagnosis not present

## 2023-04-05 DIAGNOSIS — H6121 Impacted cerumen, right ear: Secondary | ICD-10-CM | POA: Diagnosis not present

## 2023-04-06 DIAGNOSIS — J3801 Paralysis of vocal cords and larynx, unilateral: Secondary | ICD-10-CM | POA: Diagnosis not present

## 2023-04-06 DIAGNOSIS — K8592 Acute pancreatitis with infected necrosis, unspecified: Secondary | ICD-10-CM | POA: Diagnosis not present

## 2023-04-06 DIAGNOSIS — E43 Unspecified severe protein-calorie malnutrition: Secondary | ICD-10-CM | POA: Diagnosis not present

## 2023-04-08 ENCOUNTER — Ambulatory Visit
Admission: RE | Admit: 2023-04-08 | Discharge: 2023-04-08 | Disposition: A | Discharge status: Home / Self Care | Payer: HMO | Source: Ambulatory Visit | Attending: Otolaryngology | Admitting: Otolaryngology

## 2023-04-08 DIAGNOSIS — J3801 Paralysis of vocal cords and larynx, unilateral: Secondary | ICD-10-CM | POA: Diagnosis not present

## 2023-04-08 DIAGNOSIS — J3800 Paralysis of vocal cords and larynx, unspecified: Secondary | ICD-10-CM

## 2023-04-08 MED ORDER — IOPAMIDOL (ISOVUE-300) INJECTION 61%
500.0000 mL | Freq: Once | INTRAVENOUS | Status: AC | PRN
  Administered 2023-04-08: 75 mL via INTRAVENOUS

## 2023-04-13 DIAGNOSIS — E43 Unspecified severe protein-calorie malnutrition: Secondary | ICD-10-CM | POA: Diagnosis not present

## 2023-04-13 DIAGNOSIS — K8592 Acute pancreatitis with infected necrosis, unspecified: Secondary | ICD-10-CM | POA: Diagnosis not present

## 2023-04-18 DIAGNOSIS — Z9689 Presence of other specified functional implants: Secondary | ICD-10-CM | POA: Diagnosis not present

## 2023-04-18 DIAGNOSIS — N189 Chronic kidney disease, unspecified: Secondary | ICD-10-CM | POA: Diagnosis not present

## 2023-04-18 DIAGNOSIS — Z931 Gastrostomy status: Secondary | ICD-10-CM | POA: Diagnosis not present

## 2023-04-18 DIAGNOSIS — Z7901 Long term (current) use of anticoagulants: Secondary | ICD-10-CM | POA: Diagnosis not present

## 2023-04-18 DIAGNOSIS — E1122 Type 2 diabetes mellitus with diabetic chronic kidney disease: Secondary | ICD-10-CM | POA: Diagnosis not present

## 2023-04-18 DIAGNOSIS — K219 Gastro-esophageal reflux disease without esophagitis: Secondary | ICD-10-CM | POA: Diagnosis not present

## 2023-04-18 DIAGNOSIS — Z4682 Encounter for fitting and adjustment of non-vascular catheter: Secondary | ICD-10-CM | POA: Diagnosis not present

## 2023-04-18 DIAGNOSIS — D72829 Elevated white blood cell count, unspecified: Secondary | ICD-10-CM | POA: Diagnosis not present

## 2023-04-18 DIAGNOSIS — Z79899 Other long term (current) drug therapy: Secondary | ICD-10-CM | POA: Diagnosis not present

## 2023-04-18 DIAGNOSIS — K9429 Other complications of gastrostomy: Secondary | ICD-10-CM | POA: Diagnosis not present

## 2023-04-18 DIAGNOSIS — K863 Pseudocyst of pancreas: Secondary | ICD-10-CM | POA: Diagnosis not present

## 2023-04-18 DIAGNOSIS — K8689 Other specified diseases of pancreas: Secondary | ICD-10-CM | POA: Diagnosis not present

## 2023-04-18 DIAGNOSIS — I2510 Atherosclerotic heart disease of native coronary artery without angina pectoris: Secondary | ICD-10-CM | POA: Diagnosis not present

## 2023-04-18 DIAGNOSIS — K859 Acute pancreatitis without necrosis or infection, unspecified: Secondary | ICD-10-CM | POA: Diagnosis not present

## 2023-04-19 DIAGNOSIS — I255 Ischemic cardiomyopathy: Secondary | ICD-10-CM | POA: Diagnosis not present

## 2023-04-19 DIAGNOSIS — I2510 Atherosclerotic heart disease of native coronary artery without angina pectoris: Secondary | ICD-10-CM | POA: Diagnosis not present

## 2023-04-19 DIAGNOSIS — I480 Paroxysmal atrial fibrillation: Secondary | ICD-10-CM | POA: Diagnosis not present

## 2023-04-19 DIAGNOSIS — K8592 Acute pancreatitis with infected necrosis, unspecified: Secondary | ICD-10-CM | POA: Diagnosis not present

## 2023-04-21 DIAGNOSIS — L570 Actinic keratosis: Secondary | ICD-10-CM | POA: Diagnosis not present

## 2023-04-21 DIAGNOSIS — C44321 Squamous cell carcinoma of skin of nose: Secondary | ICD-10-CM | POA: Diagnosis not present

## 2023-04-21 DIAGNOSIS — L820 Inflamed seborrheic keratosis: Secondary | ICD-10-CM | POA: Diagnosis not present

## 2023-04-21 DIAGNOSIS — R1312 Dysphagia, oropharyngeal phase: Secondary | ICD-10-CM | POA: Diagnosis not present

## 2023-04-21 DIAGNOSIS — D0439 Carcinoma in situ of skin of other parts of face: Secondary | ICD-10-CM | POA: Diagnosis not present

## 2023-04-21 DIAGNOSIS — M6259 Muscle wasting and atrophy, not elsewhere classified, multiple sites: Secondary | ICD-10-CM | POA: Diagnosis not present

## 2023-04-21 DIAGNOSIS — L538 Other specified erythematous conditions: Secondary | ICD-10-CM | POA: Diagnosis not present

## 2023-04-21 DIAGNOSIS — R208 Other disturbances of skin sensation: Secondary | ICD-10-CM | POA: Diagnosis not present

## 2023-04-21 DIAGNOSIS — D485 Neoplasm of uncertain behavior of skin: Secondary | ICD-10-CM | POA: Diagnosis not present

## 2023-04-25 DIAGNOSIS — R531 Weakness: Secondary | ICD-10-CM | POA: Diagnosis not present

## 2023-04-25 DIAGNOSIS — I482 Chronic atrial fibrillation, unspecified: Secondary | ICD-10-CM | POA: Diagnosis not present

## 2023-04-25 DIAGNOSIS — K311 Adult hypertrophic pyloric stenosis: Secondary | ICD-10-CM | POA: Diagnosis not present

## 2023-04-25 DIAGNOSIS — E440 Moderate protein-calorie malnutrition: Secondary | ICD-10-CM | POA: Diagnosis not present

## 2023-04-25 DIAGNOSIS — K863 Pseudocyst of pancreas: Secondary | ICD-10-CM | POA: Diagnosis not present

## 2023-04-25 DIAGNOSIS — Z931 Gastrostomy status: Secondary | ICD-10-CM | POA: Diagnosis not present

## 2023-04-27 DIAGNOSIS — E782 Mixed hyperlipidemia: Secondary | ICD-10-CM | POA: Diagnosis not present

## 2023-04-27 DIAGNOSIS — I482 Chronic atrial fibrillation, unspecified: Secondary | ICD-10-CM | POA: Diagnosis not present

## 2023-04-27 DIAGNOSIS — E1151 Type 2 diabetes mellitus with diabetic peripheral angiopathy without gangrene: Secondary | ICD-10-CM | POA: Diagnosis not present

## 2023-04-27 DIAGNOSIS — I2510 Atherosclerotic heart disease of native coronary artery without angina pectoris: Secondary | ICD-10-CM | POA: Diagnosis not present

## 2023-04-28 DIAGNOSIS — K859 Acute pancreatitis without necrosis or infection, unspecified: Secondary | ICD-10-CM | POA: Diagnosis not present

## 2023-05-03 DIAGNOSIS — H6063 Unspecified chronic otitis externa, bilateral: Secondary | ICD-10-CM | POA: Diagnosis not present

## 2023-05-03 DIAGNOSIS — R490 Dysphonia: Secondary | ICD-10-CM | POA: Diagnosis not present

## 2023-05-05 ENCOUNTER — Ambulatory Visit: Payer: HMO | Admitting: Physical Therapy

## 2023-05-09 DIAGNOSIS — Z2821 Immunization not carried out because of patient refusal: Secondary | ICD-10-CM | POA: Diagnosis not present

## 2023-05-09 DIAGNOSIS — Z23 Encounter for immunization: Secondary | ICD-10-CM | POA: Diagnosis not present

## 2023-05-09 DIAGNOSIS — E1151 Type 2 diabetes mellitus with diabetic peripheral angiopathy without gangrene: Secondary | ICD-10-CM | POA: Diagnosis not present

## 2023-05-09 DIAGNOSIS — I482 Chronic atrial fibrillation, unspecified: Secondary | ICD-10-CM | POA: Diagnosis not present

## 2023-05-09 DIAGNOSIS — Z931 Gastrostomy status: Secondary | ICD-10-CM | POA: Diagnosis not present

## 2023-05-09 DIAGNOSIS — E440 Moderate protein-calorie malnutrition: Secondary | ICD-10-CM | POA: Diagnosis not present

## 2023-05-10 ENCOUNTER — Ambulatory Visit: Payer: HMO | Attending: Internal Medicine | Admitting: Physical Therapy

## 2023-05-10 ENCOUNTER — Encounter: Payer: Self-pay | Admitting: Physical Therapy

## 2023-05-10 DIAGNOSIS — R269 Unspecified abnormalities of gait and mobility: Secondary | ICD-10-CM | POA: Insufficient documentation

## 2023-05-10 DIAGNOSIS — R262 Difficulty in walking, not elsewhere classified: Secondary | ICD-10-CM | POA: Insufficient documentation

## 2023-05-10 DIAGNOSIS — R490 Dysphonia: Secondary | ICD-10-CM | POA: Diagnosis not present

## 2023-05-10 DIAGNOSIS — R2689 Other abnormalities of gait and mobility: Secondary | ICD-10-CM | POA: Insufficient documentation

## 2023-05-10 DIAGNOSIS — M6281 Muscle weakness (generalized): Secondary | ICD-10-CM | POA: Diagnosis not present

## 2023-05-10 NOTE — Therapy (Unsigned)
OUTPATIENT PHYSICAL THERAPY NEURO EVALUATION   Patient Name: Jon Bray MRN: [id number] DOB:30-Nov-1940, 82 y.o., male Today's Date: 05/10/2023   PCP: Danella Penton, MD REFERRING PROVIDER: Danella Penton, MD  END OF SESSION:  PT End of Session - 05/10/23 1114     Visit Number 1    Number of Visits 24    Date for PT Re-Evaluation 08/02/23    Progress Note Due on Visit 10    PT Start Time 1018    PT Stop Time 1058    PT Time Calculation (min) 40 min    Equipment Utilized During Treatment Gait belt    Activity Tolerance Patient tolerated treatment well    Behavior During Therapy WFL for tasks assessed/performed             Past Medical History:  Diagnosis Date   Anginal pain (HCC)    Aortic atherosclerosis (HCC)    Aortic root enlargement (HCC) 12/27/2017   a.) cCTA 12/27/2017: measured 3.9 cm.   Basal cell carcinoma of skin    a.) s/p Mohs procedure R/L nasal ala 01/27/2015   CAD (coronary artery disease) 05/12/2013   a.) STEMI --> LHC 05/12/2013: 20% p-mLAD, 20% D2, 40% LPDA, 30% LPAV, 99% dLCx --> PCI placing 4.5 x 20 mm and 2.5 x 12 mm Veriflex BMS to LPAV extending to dLCx. b.) cCTA 12/27/2017: Ca score 1578; 82nd percentile for age/sex match control; CT-FFR: RCA 0.99, pLAD 0.97, mLAD 0.98, dLAD 0.89, pLCx 0.98, mLCx 0.97, dLCx 0.97, PLB 0.91, PDA (most distal segment) 0.80.   Cataract    a.) s/p extraction with IOL placement   CKD (chronic kidney disease)    Diverticulosis    GERD (gastroesophageal reflux disease)    History of kidney stones 2023   HLD (hyperlipidemia)    Left lower lobe pulmonary nodule 08/01/2021   a.) CT 08/01/2021: measured 4 mm; perifissural   Long term current use of anticoagulant    a.) apixaban   NSVT (nonsustained ventricular tachycardia) (HCC) 05/13/2013   a.) 24 beat run while in ICU following PCI for STEMI   PAF (paroxysmal atrial fibrillation) (HCC) 11/16/2021   a.) new onset in ED on 11/16/2021. b.) CHA2DS2-VASc = 4 (age x 2,  STEMI/aortic plaque, T2DM). c.) rate controlled without pharmacological intervention; started on apixaban 11/16/2021, however PCP discontinued on 11/18/2021 pending Holter study results.   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   Type 2 diabetes, diet controlled [facility])    Past Surgical History:  Procedure Laterality Date   BIOPSY  12/02/2022   Procedure: BIOPSY;  Surgeon: Meridee Score Netty Starring., MD;  Location: [facility] ENDOSCOPY;  Service: Gastroenterology;;   BIOPSY  01/31/2023   Procedure: BIOPSY;  Surgeon: Imogene Burn, MD;  Location: [facility] ENDOSCOPY;  Service: Gastroenterology;;   CATARACT EXTRACTION W/ INTRAOCULAR LENS IMPLANT Bilateral    CHOLECYSTECTOMY N/A 12/03/2022   Procedure: LAPAROSCOPIC CHOLECYSTECTOMY;  Surgeon: [name], MD;  Location: [facility] OR;  Service: General;  Laterality: N/A;   COLONOSCOPY N/A 01/31/2001   COLONOSCOPY N/A 11/19/2008   CORONARY ANGIOPLASTY WITH STENT PLACEMENT Left 05/12/2013   Procedure: CORONARY ANGIOPLASTY WITH STENT PLACEMENT; Location: [facility]; Surgeon: Lieutenant Diego, MD   ENDOSCOPIC RETROGRADE CHOLANGIOPANCREATOGRAPHY (ERCP) WITH PROPOFOL N/A  11/22/2022   Procedure: ENDOSCOPIC RETROGRADE CHOLANGIOPANCREATOGRAPHY (ERCP) WITH PROPOFOL;  Surgeon: Iva Boop, MD;  Location: [facility] ENDOSCOPY;  Service: Gastroenterology;  Laterality: N/A;   ERCP N/A 12/02/2022   Procedure: ENDOSCOPIC RETROGRADE CHOLANGIOPANCREATOGRAPHY (ERCP);  Surgeon: [name]., MD;  Location: [facility] ENDOSCOPY;  Service: Gastroenterology;  Laterality: N/A;   ESOPHAGOGASTRODUODENOSCOPY N/A 03/03/2000   ESOPHAGOGASTRODUODENOSCOPY (EGD) WITH PROPOFOL N/A 10/16/2021   Procedure: ESOPHAGOGASTRODUODENOSCOPY (EGD) WITH PROPOFOL;  Surgeon: Regis Bill, MD;   Location: [facility] ENDOSCOPY;  Service: Endoscopy;  Laterality: N/A;   ESOPHAGOGASTRODUODENOSCOPY (EGD) WITH PROPOFOL N/A 01/31/2023   Procedure: ESOPHAGOGASTRODUODENOSCOPY (EGD) WITH PROPOFOL;  Surgeon: Imogene Burn, MD;  Location: [facility] ENDOSCOPY;  Service: Gastroenterology;  Laterality: N/A;   ESOPHAGOGASTRODUODENOSCOPY (EGD) WITH PROPOFOL N/A 02/16/2023   Procedure: ESOPHAGOGASTRODUODENOSCOPY (EGD) WITH PROPOFOL;  Surgeon: Meridee Score Netty Starring., MD;  Location: [facility] ENDOSCOPY;  Service: Gastroenterology;  Laterality: N/A;   EUS N/A 02/16/2023   Procedure: UPPER ENDOSCOPIC ULTRASOUND (EUS) LINEAR;  Surgeon: [name]., MD;  Location: [facility] ENDOSCOPY;  Service: Gastroenterology;  Laterality: N/A;   EXTRACORPOREAL SHOCK WAVE LITHOTRIPSY Left 08/06/2021   Procedure: EXTRACORPOREAL SHOCK WAVE LITHOTRIPSY (ESWL);  Surgeon: Vanna Scotland, MD;  Location: [facility] ORS;  Service: Urology;  Laterality: Left;   INGUINAL HERNIA REPAIR Right 11/27/2021   Procedure: HERNIA REPAIR INGUINAL ADULT;  Surgeon: Earline Mayotte, MD;  Location: [facility] ORS;  Service: General;  Laterality: Right;   INTRAOCULAR LENS EXCHANGE Left 12/27/2018   Procedure: MECHANICAL VITRECTOMY,  EXCHANGE LENS PROSTHESIS VIA PARS PLANA APPROACH; Location: [facility]; Surgeon: Nicholaus Corolla, MD   MOHS SURGERY Bilateral 01/27/2015   Procedure: MOHS SURGERY TO REMOVE BASAL CELL CARCINOMA FROM RIGHT/LEFT NASAL ALA; Location: [facility]; Surgeon: Katrine Coho, MD   REMOVAL OF STONES  12/02/2022   Procedure: REMOVAL OF STONES;  Surgeon: Meridee Score Netty Starring., MD;  Location: [facility] ENDOSCOPY;  Service: Gastroenterology;;   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   Procedure: RPR RETINAL DTCHMNT W/VITRECTOMY ANY METH REPAIR RETINAL DETACHMENT; WITH VITRECTOMY, INC, WHEN PERFORMED, AIR/GAS TAMPONADE, FOCAL ENDOLASER PHOTOCOAGULATION, CRYOTHERAPY, DRAINAGE OF SUBRETINAL FLUID, SCLERAL BUCKLING AND/OR REMOVAL OF LENS; Location: [facility]; Surgeon: Nicholaus Corolla, MD    SINUSOTOMY N/A    Procedure: MAXILLARY SINUSOTOMY INTRANASAL   SPHINCTEROTOMY  12/02/2022   Procedure: SPHINCTEROTOMY;  Surgeon: Mansouraty, Netty Starring., MD;  Location: [facility] ENDOSCOPY;  Service: Gastroenterology;;   TONSILLECTOMY AND ADENOIDECTOMY N/A    Patient Active Problem List   Diagnosis Date Noted   Hypokalemia 02/16/2023   Gastric outflow obstruction 02/14/2023   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   Acute recurrent pancreatitis 02/13/2023   Intractable nausea and vomiting 02/13/2023   Displaced nasogastric feeding tube 02/12/2023   Esophageal stenosis 02/12/2023   Chronic pancreatitis (HCC) 02/12/2023   Leukocytosis 02/12/2023   Essential hypertension 02/12/2023   History of CAD (coronary artery disease) 02/12/2023   Atrial fibrillation, chronic -not on coagulation (HCC) 02/12/2023   Chronic disease anemia 02/12/2023   Encounter for feeding tube placement 02/12/2023   Malnutrition of moderate degree 02/01/2023   Nausea and vomiting 01/31/2023   Abnormal CT scan 01/31/2023   Acute pancreatitis 01/28/2023   Chronic diarrhea 12/05/2022   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   Hemorrhagic pancreatitis 11/30/2022   History of ERCP 11/30/2022   Biliary obstruction 11/30/2022   Moderate protein-calorie malnutrition (HCC) 11/30/2022   Gallstone pancreatitis 11/30/2022   Choledocholithiasis 11/21/2022   Acute gallstone pancreatitis 11/17/2022   PAF (paroxysmal atrial fibrillation) (HCC) 11/16/2021   Diabetes type 2, controlled (HCC) 05/15/2018   Hyperlipidemia, mixed 10/28/2015  CAD (coronary artery disease) 05/12/2013   GERD (gastroesophageal reflux disease) 05/12/2013    ONSET DATE: 11/03/2022  REFERRING DIAG: R53.1 (ICD-10-CM) - Weakness   THERAPY DIAG:  Difficulty in walking, not elsewhere classified  Other abnormalities of gait and mobility  Muscle weakness (generalized)  Rationale for Evaluation and Treatment: Rehabilitation  SUBJECTIVE:                                                                                                                                                                                              SUBJECTIVE STATEMENT: Pt stated that he was diagnosed with pancreatitis in May of 2024 and has been in and out of the hospital ever since. Pt stated that he lost 35 lbs in 2 weeks between May & June of 2024 and has had difficulty gaining weight since then. Pt stated that he has been utilizing a feeding tube for the past 45 days. Ever since diagnosis of pancreatitis, pt feels that he has lost muscle tone, muscle strength, and endurance. Pt stated that he goes to the gym 2 days a week. Pt has had PT at [facility] rehab, as well as home health and was told he needed PT that was more advanced. Pt feels like balance has been regressing ever since hospitalization in May. 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     Pt accompanied by: self  PERTINENT HISTORY: Pancreatitis, CAD, GERD, Hyperlipidemia, Diabetes Type 2, Paroxysmal Atrial Fibrillation, Malnutrition, Hypertension  PAIN:  Are you having pain? No  PRECAUTIONS: None  RED FLAGS: None   WEIGHT BEARING RESTRICTIONS: No  FALLS: Has patient fallen in last 6 months? No  LIVING ENVIRONMENT: Lives with: lives with their family and lives with their spouse Lives in: House/apartment Stairs: Yes: External: 2 steps; on right going up Has following equipment at home: Single point cane  PLOF: Independent  PATIENT GOALS: Increase muscle strength, endurance, and get back to being able to [place] drive [place]   OBJECTIVE:  Note: Objective measures were completed at Evaluation unless otherwise noted.  DIAGNOSTIC FINDINGS: N/A  COGNITION: Overall cognitive status: Within functional limits for tasks assessed   SENSATION: Not tested  COORDINATION: WFL  EDEMA:  Not tested   MUSCLE TONE: Not tested  MUSCLE LENGTH: Not tested   DTRs:   Not tested  POSTURE: No Significant postural limitations  LOWER EXTREMITY ROM:     Active  Right Eval Left Eval  Hip flexion    Hip extension    Hip abduction    Hip adduction    Hip internal  rotation    Hip external rotation    Knee flexion    Knee extension    Ankle dorsiflexion    Ankle plantarflexion    Ankle inversion    Ankle eversion     (Blank rows = not tested)  LOWER EXTREMITY MMT:    MMT Right Eval Left Eval  Hip flexion 4 4-  Hip extension    Hip abduction    Hip adduction    Hip internal rotation    Hip external rotation    Knee flexion    Knee extension 4 4  Ankle dorsiflexion 4 4-  Ankle plantarflexion    Ankle inversion    Ankle eversion    (Blank rows = not tested)  BED MOBILITY:  WFL  TRANSFERS: Assistive device utilized: N/A  Sit to stand: Complete Independence Stand to sit: Complete Independence Chair to chair: Complete Independence Floor:  Not tested  RAMP:  Level of Assistance:  Not tested  Assistive device utilized: N/A  CURB:  Level of Assistance:  Not tested Assistive device utilized: N/A  STAIRS: Level of Assistance:  Not tested  GAIT: Gait pattern: WFL Distance walked: 20 meters Assistive device utilized:  None Level of assistance: SBA  FUNCTIONAL TESTS:  5 times sit to stand: 10 seconds 10 meter walk test: 6.16 seconds  PATIENT SURVEYS:  FOTO 56  TODAY'S TREATMENT:                                                                                                                              DATE: 05/10/2023 Physical therapy treatment session today consisted of completing assessment of goals and administration of testing as demonstrated and documented in flow sheet, treatment, and goals section of this note. Addition treatments may be found below.      PATIENT EDUCATION: Education details: N/A Person educated:  N/A Education method:  N/A Education comprehension:  N/A  HOME EXERCISE PROGRAM: To be  administered next visit  GOALS: Goals reviewed with patient? No  SHORT TERM GOALS: Target date: 06/07/2023    Patient will be independent in home exercise program to improve strength/mobility for better functional independence with ADLs. Baseline: No HEP currently  Goal status: INITIAL    LONG TERM GOALS: Target date: 08/02/2023    Pt will increase FOTO score to at least 65 in order to demonstrate increased independence with ADLs.  Baseline: 05/10/23: 56 Goal status: INITIAL  2.  Pt will decrease 5xSTS by 2 seconds in order to demonstrate increased functional mobility strength in lower extremities.  Baseline: 05/10/23: 10 seconds  Goal status: INITIAL  3.  Pt will improve 200 feet or more during the in order to demonstrate increased endurance to perform IADLs.  Baseline: To be assessed next visit Goal status: INITIAL  4.  Pt will increase Mini Best Balance test score by 4 points in order to demonstrate increased balance, so that the patient can safely play a full game  of golf.  Baseline: To be assessed next visit Goal status: INITIAL      ASSESSMENT:  CLINICAL IMPRESSION:  Patient is a 82 y.o. male who was seen today for physical therapy evaluation and treatment for generalized weakness. Pt was administered the 10 meter walk test and 5 time sit to stand test. Because the pt achieved the 5 time sit to stand test in 10 seconds, a goal was not established for that test. More functional tests will be administered next visit to establish a baseline of the pt's endurance and balance. Once these baselines are established, a POC will be established to target potential deficits. Pt will continue to benefit from skilled physical therapy intervention to address impairments, improve QOL, and attain therapy goals.     OBJECTIVE IMPAIRMENTS: decreased endurance.   ACTIVITY LIMITATIONS:  N/A  PARTICIPATION LIMITATIONS:  N/A  PERSONAL FACTORS: 1 comorbidity: pancreatitis  are also  affecting patient's functional outcome.   REHAB POTENTIAL: Good  CLINICAL DECISION MAKING: Evolving/moderate complexity  EVALUATION COMPLEXITY: Low  PLAN:  PT FREQUENCY: 2x/week  PT DURATION: 12 weeks  PLANNED INTERVENTIONS: 97110-Therapeutic exercises, 97530- Therapeutic activity, 97112- Neuromuscular re-education, 97535- Self Care, and 14431- Manual therapy  PLAN FOR NEXT SESSION: To administer Mini Best balance test, 6 minute walk test, HEP, and perform strengthening and balance therex.    Debara Pickett, Student-PT 05/10/2023, 11:24 AM

## 2023-05-11 DIAGNOSIS — Z972 Presence of dental prosthetic device (complete) (partial): Secondary | ICD-10-CM | POA: Diagnosis not present

## 2023-05-11 DIAGNOSIS — E1122 Type 2 diabetes mellitus with diabetic chronic kidney disease: Secondary | ICD-10-CM | POA: Diagnosis not present

## 2023-05-11 DIAGNOSIS — Z7984 Long term (current) use of oral hypoglycemic drugs: Secondary | ICD-10-CM | POA: Diagnosis not present

## 2023-05-11 DIAGNOSIS — Z931 Gastrostomy status: Secondary | ICD-10-CM | POA: Diagnosis not present

## 2023-05-11 DIAGNOSIS — I252 Old myocardial infarction: Secondary | ICD-10-CM | POA: Diagnosis not present

## 2023-05-11 DIAGNOSIS — K859 Acute pancreatitis without necrosis or infection, unspecified: Secondary | ICD-10-CM | POA: Diagnosis not present

## 2023-05-11 DIAGNOSIS — N189 Chronic kidney disease, unspecified: Secondary | ICD-10-CM | POA: Diagnosis not present

## 2023-05-11 DIAGNOSIS — K838 Other specified diseases of biliary tract: Secondary | ICD-10-CM | POA: Diagnosis not present

## 2023-05-11 DIAGNOSIS — K863 Pseudocyst of pancreas: Secondary | ICD-10-CM | POA: Diagnosis not present

## 2023-05-11 DIAGNOSIS — T182XXA Foreign body in stomach, initial encounter: Secondary | ICD-10-CM | POA: Diagnosis not present

## 2023-05-11 DIAGNOSIS — Z4659 Encounter for fitting and adjustment of other gastrointestinal appliance and device: Secondary | ICD-10-CM | POA: Diagnosis not present

## 2023-05-11 DIAGNOSIS — I5022 Chronic systolic (congestive) heart failure: Secondary | ICD-10-CM | POA: Diagnosis not present

## 2023-05-11 DIAGNOSIS — Z79899 Other long term (current) drug therapy: Secondary | ICD-10-CM | POA: Diagnosis not present

## 2023-05-11 DIAGNOSIS — I2510 Atherosclerotic heart disease of native coronary artery without angina pectoris: Secondary | ICD-10-CM | POA: Diagnosis not present

## 2023-05-11 DIAGNOSIS — E785 Hyperlipidemia, unspecified: Secondary | ICD-10-CM | POA: Diagnosis not present

## 2023-05-11 DIAGNOSIS — I480 Paroxysmal atrial fibrillation: Secondary | ICD-10-CM | POA: Diagnosis not present

## 2023-05-11 DIAGNOSIS — K219 Gastro-esophageal reflux disease without esophagitis: Secondary | ICD-10-CM | POA: Diagnosis not present

## 2023-05-11 DIAGNOSIS — I130 Hypertensive heart and chronic kidney disease with heart failure and stage 1 through stage 4 chronic kidney disease, or unspecified chronic kidney disease: Secondary | ICD-10-CM | POA: Diagnosis not present

## 2023-05-11 DIAGNOSIS — Z7901 Long term (current) use of anticoagulants: Secondary | ICD-10-CM | POA: Diagnosis not present

## 2023-05-12 ENCOUNTER — Ambulatory Visit: Payer: HMO | Admitting: Physical Therapy

## 2023-05-13 ENCOUNTER — Ambulatory Visit: Payer: HMO | Admitting: Physical Therapy

## 2023-05-13 DIAGNOSIS — R262 Difficulty in walking, not elsewhere classified: Secondary | ICD-10-CM

## 2023-05-13 DIAGNOSIS — R2689 Other abnormalities of gait and mobility: Secondary | ICD-10-CM

## 2023-05-13 DIAGNOSIS — R269 Unspecified abnormalities of gait and mobility: Secondary | ICD-10-CM

## 2023-05-13 DIAGNOSIS — M6281 Muscle weakness (generalized): Secondary | ICD-10-CM

## 2023-05-13 NOTE — Therapy (Signed)
OUTPATIENT PHYSICAL THERAPY NEURO TREATMENT   Patient Name: Jon Bray MRN: [id number] DOB:19-Jul-1940, 82 y.o., male Today's Date: 05/13/2023   PCP: Danella Penton, MD REFERRING PROVIDER: Danella Penton, MD  END OF SESSION:  PT End of Session - 05/13/23 1004     Visit Number 2    Number of Visits 24    Date for PT Re-Evaluation 08/02/23    Progress Note Due on Visit 10    PT Start Time 1007    PT Stop Time 1050    PT Time Calculation (min) 43 min    Equipment Utilized During Treatment Gait belt    Activity Tolerance Patient tolerated treatment well    Behavior During Therapy WFL for tasks assessed/performed             Past Medical History:  Diagnosis Date   Anginal pain (HCC)    Aortic atherosclerosis (HCC)    Aortic root enlargement (HCC) 12/27/2017   a.) cCTA 12/27/2017: measured 3.9 cm.   Basal cell carcinoma of skin    a.) s/p Mohs procedure R/L nasal ala 01/27/2015   CAD (coronary artery disease) 05/12/2013   a.) STEMI --> LHC 05/12/2013: 20% p-mLAD, 20% D2, 40% LPDA, 30% LPAV, 99% dLCx --> PCI placing 4.5 x 20 mm and 2.5 x 12 mm Veriflex BMS to LPAV extending to dLCx. b.) cCTA 12/27/2017: Ca score 1578; 82nd percentile for age/sex match control; CT-FFR: RCA 0.99, pLAD 0.97, mLAD 0.98, dLAD 0.89, pLCx 0.98, mLCx 0.97, dLCx 0.97, PLB 0.91, PDA (most distal segment) 0.80.   Cataract    a.) s/p extraction with IOL placement   CKD (chronic kidney disease)    Diverticulosis    GERD (gastroesophageal reflux disease)    History of kidney stones 2023   HLD (hyperlipidemia)    Left lower lobe pulmonary nodule 08/01/2021   a.) CT 08/01/2021: measured 4 mm; perifissural   Long term current use of anticoagulant    a.) apixaban   NSVT (nonsustained ventricular tachycardia) (HCC) 05/13/2013   a.) 24 beat run while in ICU following PCI for STEMI   PAF (paroxysmal atrial fibrillation) (HCC) 11/16/2021   a.) new onset in ED on 11/16/2021. b.) CHA2DS2-VASc = 4 (age x 2,  STEMI/aortic plaque, T2DM). c.) rate controlled without pharmacological intervention; started on apixaban 11/16/2021, however PCP discontinued on 11/18/2021 pending Holter study results.   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   Type 2 diabetes, diet controlled [facility])    Past Surgical History:  Procedure Laterality Date   BIOPSY  12/02/2022   Procedure: BIOPSY;  Surgeon: Meridee Score Netty Starring., MD;  Location: [facility] ENDOSCOPY;  Service: Gastroenterology;;   BIOPSY  01/31/2023   Procedure: BIOPSY;  Surgeon: Imogene Burn, MD;  Location: [facility] ENDOSCOPY;  Service: Gastroenterology;;   CATARACT EXTRACTION W/ INTRAOCULAR LENS IMPLANT Bilateral    CHOLECYSTECTOMY N/A 12/03/2022   Procedure: LAPAROSCOPIC CHOLECYSTECTOMY;  Surgeon: [name], MD;  Location: [facility] OR;  Service: General;  Laterality: N/A;   COLONOSCOPY N/A 01/31/2001   COLONOSCOPY N/A 11/19/2008   CORONARY ANGIOPLASTY WITH STENT PLACEMENT Left 05/12/2013   Procedure: CORONARY ANGIOPLASTY WITH STENT PLACEMENT; Location: [facility]; Surgeon: Lieutenant Diego, MD   ENDOSCOPIC RETROGRADE CHOLANGIOPANCREATOGRAPHY (ERCP) WITH PROPOFOL N/A  11/22/2022   Procedure: ENDOSCOPIC RETROGRADE CHOLANGIOPANCREATOGRAPHY (ERCP) WITH PROPOFOL;  Surgeon: Iva Boop, MD;  Location: [facility] ENDOSCOPY;  Service: Gastroenterology;  Laterality: N/A;   ERCP N/A 12/02/2022   Procedure: ENDOSCOPIC RETROGRADE CHOLANGIOPANCREATOGRAPHY (ERCP);  Surgeon: [name]., MD;  Location: [facility] ENDOSCOPY;  Service: Gastroenterology;  Laterality: N/A;   ESOPHAGOGASTRODUODENOSCOPY N/A 03/03/2000   ESOPHAGOGASTRODUODENOSCOPY (EGD) WITH PROPOFOL N/A 10/16/2021   Procedure: ESOPHAGOGASTRODUODENOSCOPY (EGD) WITH PROPOFOL;  Surgeon: Regis Bill, MD;   Location: [facility] ENDOSCOPY;  Service: Endoscopy;  Laterality: N/A;   ESOPHAGOGASTRODUODENOSCOPY (EGD) WITH PROPOFOL N/A 01/31/2023   Procedure: ESOPHAGOGASTRODUODENOSCOPY (EGD) WITH PROPOFOL;  Surgeon: Imogene Burn, MD;  Location: [facility] ENDOSCOPY;  Service: Gastroenterology;  Laterality: N/A;   ESOPHAGOGASTRODUODENOSCOPY (EGD) WITH PROPOFOL N/A 02/16/2023   Procedure: ESOPHAGOGASTRODUODENOSCOPY (EGD) WITH PROPOFOL;  Surgeon: Meridee Score Netty Starring., MD;  Location: [facility] ENDOSCOPY;  Service: Gastroenterology;  Laterality: N/A;   EUS N/A 02/16/2023   Procedure: UPPER ENDOSCOPIC ULTRASOUND (EUS) LINEAR;  Surgeon: [name]., MD;  Location: [facility]COPY;  Service: Gastroenterology;  Laterality: N/A;   EXTRACORPOREAL SHOCK WAVE LITHOTRIPSY Left 08/06/2021   Procedure: EXTRACORPOREAL SHOCK WAVE LITHOTRIPSY (ESWL);  Surgeon: Vanna Scotland, MD;  Location: [facility] ORS;  Service: Urology;  Laterality: Left;   INGUINAL HERNIA REPAIR Right 11/27/2021   Procedure: HERNIA REPAIR INGUINAL ADULT;  Surgeon: Earline Mayotte, MD;  Location: [facility] ORS;  Service: General;  Laterality: Right;   INTRAOCULAR LENS EXCHANGE Left 12/27/2018   Procedure: MECHANICAL VITRECTOMY,  EXCHANGE LENS PROSTHESIS VIA PARS PLANA APPROACH; Location: [facility]; Surgeon: Nicholaus Corolla, MD   MOHS SURGERY Bilateral 01/27/2015   Procedure: MOHS SURGERY TO REMOVE BASAL CELL CARCINOMA FROM RIGHT/LEFT NASAL ALA; Location: [facility]; Surgeon: Katrine Coho, MD   REMOVAL OF STONES  12/02/2022   Procedure: REMOVAL OF STONES;  Surgeon: Meridee Score Netty Starring., MD;  Location: [facility] ENDOSCOPY;  Service: Gastroenterology;;   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   Procedure: RPR RETINAL DTCHMNT W/VITRECTOMY ANY METH REPAIR RETINAL DETACHMENT; WITH VITRECTOMY, INC, WHEN PERFORMED, AIR/GAS TAMPONADE, FOCAL ENDOLASER PHOTOCOAGULATION, CRYOTHERAPY, DRAINAGE OF SUBRETINAL FLUID, SCLERAL BUCKLING AND/OR REMOVAL OF LENS; Location: [facility]; Surgeon: Nicholaus Corolla, MD    SINUSOTOMY N/A    Procedure: MAXILLARY SINUSOTOMY INTRANASAL   SPHINCTEROTOMY  12/02/2022   Procedure: SPHINCTEROTOMY;  Surgeon: Mansouraty, Netty Starring., MD;  Location: [facility] ENDOSCOPY;  Service: Gastroenterology;;   TONSILLECTOMY AND ADENOIDECTOMY N/A    Patient Active Problem List   Diagnosis Date Noted   Hypokalemia 02/16/2023   Gastric outflow obstruction 02/14/2023   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   Acute recurrent pancreatitis 02/13/2023   Intractable nausea and vomiting 02/13/2023   Displaced nasogastric feeding tube 02/12/2023   Esophageal stenosis 02/12/2023   Chronic pancreatitis (HCC) 02/12/2023   Leukocytosis 02/12/2023   Essential hypertension 02/12/2023   History of CAD (coronary artery disease) 02/12/2023   Atrial fibrillation, chronic -not on coagulation (HCC) 02/12/2023   Chronic disease anemia 02/12/2023   Encounter for feeding tube placement 02/12/2023   Malnutrition of moderate degree 02/01/2023   Nausea and vomiting 01/31/2023   Abnormal CT scan 01/31/2023   Acute pancreatitis 01/28/2023   Chronic diarrhea 12/05/2022   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   Hemorrhagic pancreatitis 11/30/2022   History of ERCP 11/30/2022   Biliary obstruction 11/30/2022   Moderate protein-calorie malnutrition (HCC) 11/30/2022   Gallstone pancreatitis 11/30/2022   Choledocholithiasis 11/21/2022   Acute gallstone pancreatitis 11/17/2022   PAF (paroxysmal atrial fibrillation) (HCC) 11/16/2021   Diabetes type 2, controlled (HCC) 05/15/2018   Hyperlipidemia, mixed 10/28/2015  CAD (coronary artery disease) 05/12/2013   GERD (gastroesophageal reflux disease) 05/12/2013    ONSET DATE: 11/03/2022  REFERRING DIAG: R53.1 (ICD-10-CM) - Weakness   THERAPY DIAG:  Abnormality of gait and mobility  Difficulty in walking, not elsewhere classified  Other abnormalities of gait and mobility  Muscle weakness (generalized)  Rationale for Evaluation and Treatment:  Rehabilitation  SUBJECTIVE:                                                                                                                                                                                             SUBJECTIVE STATEMENT: Pt reports he had a same day procedure on Wednesday nad is feeling weak today but overall okay.   Pt accompanied by: self  PERTINENT HISTORY: Pancreatitis, CAD, GERD, Hyperlipidemia, Diabetes Type 2, Paroxysmal Atrial Fibrillation, Malnutrition, Hypertension From initial eval: Pt stated that he was diagnosed with pancreatitis in May of 2024 and has been in and out of the hospital ever since. Pt stated that he lost 35 lbs in 2 weeks between May & June of 2024 and has had difficulty gaining weight since then. Pt stated that he has been utilizing a feeding tube for the past 45 days. Ever since diagnosis of pancreatitis, pt feels that he has lost muscle tone, muscle strength, and endurance. Pt stated that he goes to the gym 2 days a week. Pt has had PT at [facility] rehab, as well as home health and was told he needed PT that was more advanced. Pt feels like balance has been regressing ever since hospitalization in May. 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      PAIN:  Are you having pain? No  PRECAUTIONS: None  RED FLAGS: None   WEIGHT BEARING RESTRICTIONS: No  FALLS: Has patient fallen in last 6 months? No  LIVING ENVIRONMENT: Lives with: lives with their family and lives with their spouse Lives in: House/apartment Stairs: Yes: External: 2 steps; on right going up Has following equipment at home: Single point cane  PLOF: Independent  PATIENT GOALS: Increase muscle strength, endurance, and get back to being able to [place] drive [place]   OBJECTIVE:  Note: Objective measures were completed at Evaluation unless otherwise noted.  DIAGNOSTIC FINDINGS: N/A  COGNITION: Overall cognitive status: Within  functional limits for tasks assessed   SENSATION: Not tested  COORDINATION: WFL  EDEMA:  Not tested   MUSCLE TONE: Not tested  MUSCLE LENGTH: Not tested   DTRs:  Not tested  POSTURE: No Significant postural limitations  LOWER EXTREMITY ROM:  Active  Right Eval Left Eval  Hip flexion    Hip extension    Hip abduction    Hip adduction    Hip internal rotation    Hip external rotation    Knee flexion    Knee extension    Ankle dorsiflexion    Ankle plantarflexion    Ankle inversion    Ankle eversion     (Blank rows = not tested)  LOWER EXTREMITY MMT:    MMT Right Eval Left Eval  Hip flexion 4 4-  Hip extension    Hip abduction    Hip adduction    Hip internal rotation    Hip external rotation    Knee flexion    Knee extension 4 4  Ankle dorsiflexion 4 4-  Ankle plantarflexion    Ankle inversion    Ankle eversion    (Blank rows = not tested)  BED MOBILITY:  WFL  TRANSFERS: Assistive device utilized: N/A  Sit to stand: Complete Independence Stand to sit: Complete Independence Chair to chair: Complete Independence Floor:  Not tested  RAMP:  Level of Assistance:  Not tested  Assistive device utilized: N/A  CURB:  Level of Assistance:  Not tested Assistive device utilized: N/A  STAIRS: Level of Assistance:  Not tested  GAIT: Gait pattern: WFL Distance walked: 20 meters Assistive device utilized:  None Level of assistance: SBA  FUNCTIONAL TESTS:  5 times sit to stand: 10 seconds 10 meter walk test: 6.16 seconds  PATIENT SURVEYS:  FOTO 56  TODAY'S TREATMENT:                                                                                                                              DATE: 05/10/2023 Continues with tests and measures form initial evaluation this date.  TE : 1070 ft SPO2 prior: 99% 4 min in: 100% Post 97% Therapeutic rest break following for 02sat to return to baseline.   NMR  [facility] PT Assessment - 05/13/23  0001       Standardized Balance Assessment   Standardized Balance Assessment Mini-BESTest      Mini-BESTest   Sit To Stand Normal: Comes to stand without use of hands and stabilizes independently.    Rise to Toes Normal: Stable for 3 s with maximum height.    Stand on one leg (left) Moderate: < 20 s    Stand on one leg (right) Moderate: < 20 s    Stand on one leg - lowest score 1    Compensatory Stepping Correction - Forward Normal: Recovers independently with a single, large step (second realignement is allowed).    Compensatory Stepping Correction - Backward Moderate: More than one step is required to recover equilibrium    Compensatory Stepping Correction - Left Lateral Moderate: Several steps to recover equilibrium    Compensatory Stepping Correction - Right Lateral Normal: Recovers independently with 1 step (crossover or lateral OK)    Stepping Corredtion  Lateral - lowest score 1    Stance - Feet together, eyes open, firm surface  Normal: 30s    Stance - Feet together, eyes closed, foam surface  Normal: 30s    Incline - Eyes Closed Normal: Stands independently 30s and aligns with gravity    Change in Gait Speed Normal: Significantly changes walkling speed without imbalance    Walk with head turns - Horizontal Moderate: performs head turns with reduction in gait speed.    Walk with pivot turns Normal: Turns with feet close FAST (< 3 steps) with good balance.    Step over obstacles Normal: Able to step over box with minimal change of gait speed and with good balance.    Timed UP & GO with Dual Task Severe: Stops counting while walking OR stops walking while counting.   Tug: 9.57, Dual Task: 12.58, pt walked multiple feet past line during dual task portion   Mini-BEST total score 22            SLS practice x 30 sec ea side with fingertip support   Airex balance beam: x 4 laps, intermittent U UE support   2 x 10 heel raises with 3 second holds    PATIENT EDUCATION: Education  details: POC Person educated: Patient Education method: Explanation Education comprehension: verbalized understanding   HOME EXERCISE PROGRAM: Access Code: [email or web address] URL: [email or web address] Date: 05/13/2023 Prepared by: Thresa Ross  Exercises - Standing Single Leg Stance with Counter Support  - 1 x daily - 7 x weekly - 3 sets - 30 sec  hold - Standing Heel Raise  - 1 x daily - 7 x weekly - 2 sets - 10 reps - 3 second hold  HOME EXERCISE PROGRAM: To be administered next visit  GOALS: Goals reviewed with patient? No  SHORT TERM GOALS: Target date: 06/07/2023    Patient will be independent in home exercise program to improve strength/mobility for better functional independence with ADLs. Baseline: No HEP currently  Goal status: INITIAL    LONG TERM GOALS: Target date: 08/02/2023    Pt will increase FOTO score to at least 65 in order to demonstrate increased independence with ADLs.  Baseline: 05/10/23: 56 Goal status: INITIAL  2.  Pt will decrease 5xSTS by 2 seconds in order to demonstrate increased functional mobility strength in lower extremities.  Baseline: 05/10/23: 10 seconds  Goal status: INITIAL  3.  Pt will improve 200 feet or more during the in order to demonstrate increased endurance to perform IADLs.  Baseline: 11/8: 1070 ft  Goal status: INITIAL  4.  Pt will increase Mini Best Balance test score by 4 points in order to demonstrate increased balance, so that the patient can safely play a full game of golf.  Baseline: 11/8:22 Goal status: INITIAL      ASSESSMENT:  CLINICAL IMPRESSION:  Pt presents for continuation of therapy this date. Pt shows deficits with balance AEB mini best balance test and shows deficits with his endurance with ambulation AEB results. Pt provided with initial HEP this date and can be progressed as is indicated. Will progress with dynamic balance, LE strength and endurance in next session. Pt will  continue to benefit from skilled physical therapy intervention to address impairments, improve QOL, and attain therapy goals.    OBJECTIVE IMPAIRMENTS: decreased endurance.   ACTIVITY LIMITATIONS:  N/A  PARTICIPATION LIMITATIONS:  N/A  PERSONAL FACTORS: 1 comorbidity: pancreatitis  are also affecting patient's functional outcome.   REHAB POTENTIAL: Good  CLINICAL DECISION MAKING: Evolving/moderate complexity  EVALUATION COMPLEXITY: Low  PLAN:  PT FREQUENCY: 2x/week  PT DURATION: 12 weeks  PLANNED INTERVENTIONS: 97110-Therapeutic exercises, 97530- Therapeutic activity, [id number]- Neuromuscular re-education, 97535- Self Care, and [id number]- Manual therapy  PLAN FOR NEXT SESSION: endurance, dynamic balance program, progress HEP as indicated.    Norman Herrlich, PT 05/13/2023, 10:05 AM

## 2023-05-16 DIAGNOSIS — I482 Chronic atrial fibrillation, unspecified: Secondary | ICD-10-CM | POA: Diagnosis not present

## 2023-05-18 ENCOUNTER — Ambulatory Visit: Payer: HMO

## 2023-05-18 ENCOUNTER — Ambulatory Visit: Payer: HMO | Admitting: Speech Pathology

## 2023-05-18 DIAGNOSIS — R262 Difficulty in walking, not elsewhere classified: Secondary | ICD-10-CM

## 2023-05-18 DIAGNOSIS — M6281 Muscle weakness (generalized): Secondary | ICD-10-CM

## 2023-05-18 DIAGNOSIS — R490 Dysphonia: Secondary | ICD-10-CM

## 2023-05-18 DIAGNOSIS — R269 Unspecified abnormalities of gait and mobility: Secondary | ICD-10-CM

## 2023-05-18 DIAGNOSIS — R2689 Other abnormalities of gait and mobility: Secondary | ICD-10-CM

## 2023-05-18 NOTE — Therapy (Unsigned)
OUTPATIENT SPEECH LANGUAGE PATHOLOGY  VOICE EVALUATION   Patient Name: Jon Bray MRN: [id number] DOB:Nov 19, 1940, 82 y.o., male Today's Date: 05/20/2023  PCP: Bethann Punches, MD REFERRING PROVIDER: Bud Face, MD   End of Session - 05/20/23 1157     Number of Visits 17    Date for SLP Re-Evaluation 07/13/23    Authorization Type [employer]    Progress Note Due on Visit 10    SLP Start Time 0930    SLP Stop Time  1015    SLP Time Calculation (min) 45 min    Activity Tolerance Patient tolerated treatment well                Past Medical History:  Diagnosis Date   Anginal pain (HCC)    Aortic atherosclerosis (HCC)    Aortic root enlargement (HCC) 12/27/2017   a.) cCTA 12/27/2017: measured 3.9 cm.   Basal cell carcinoma of skin    a.) s/p Mohs procedure R/L nasal ala 01/27/2015   CAD (coronary artery disease) 05/12/2013   a.) STEMI --> LHC 05/12/2013: 20% p-mLAD, 20% D2, 40% LPDA, 30% LPAV, 99% dLCx --> PCI placing 4.5 x 20 mm and 2.5 x 12 mm Veriflex BMS to LPAV extending to dLCx. b.) cCTA 12/27/2017: Ca score 1578; 82nd percentile for age/sex match control; CT-FFR: RCA 0.99, pLAD 0.97, mLAD 0.98, dLAD 0.89, pLCx 0.98, mLCx 0.97, dLCx 0.97, PLB 0.91, PDA (most distal segment) 0.80.   Cataract    a.) s/p extraction with IOL placement   CKD (chronic kidney disease)    Diverticulosis    GERD (gastroesophageal reflux disease)    History of kidney stones 2023   HLD (hyperlipidemia)    Left lower lobe pulmonary nodule 08/01/2021   a.) CT 08/01/2021: measured 4 mm; perifissural   Long term current use of anticoagulant    a.) apixaban   NSVT (nonsustained ventricular tachycardia) (HCC) 05/13/2013   a.) 24 beat run while in ICU following PCI for STEMI   PAF (paroxysmal atrial fibrillation) (HCC) 11/16/2021   a.) new onset in ED on 11/16/2021. b.) CHA2DS2-VASc = 4 (age x 2, STEMI/aortic plaque, T2DM). c.) rate controlled without pharmacological  intervention; started on apixaban 11/16/2021, however PCP discontinued on 11/18/2021 pending Holter study results.   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   Type 2 diabetes, diet controlled [facility])    Past Surgical History:  Procedure Laterality Date   BIOPSY  12/02/2022   Procedure: BIOPSY;  Surgeon: Meridee Score Netty Starring., MD;  Location: [facility] ENDOSCOPY;  Service: Gastroenterology;;   BIOPSY  01/31/2023   Procedure: BIOPSY;  Surgeon: Imogene Burn, MD;  Location[facility] ENDOSCOPY;  Service: Gastroenterology;;   CATARACT EXTRACTION W/ INTRAOCULAR LENS IMPLANT Bilateral    CHOLECYSTECTOMY N/A 12/03/2022   Procedure: LAPAROSCOPIC CHOLECYSTECTOMY;  Surgeon: [name], MD;  Location: [facility] OR;  Service: General;  Laterality: N/A;   COLONOSCOPY N/A 01/31/2001   COLONOSCOPY N/A 11/19/2008   CORONARY ANGIOPLASTY WITH STENT PLACEMENT Left 05/12/2013   Procedure: CORONARY ANGIOPLASTY WITH STENT PLACEMENT; Location: [facility]; Surgeon: Lieutenant Diego, MD   ENDOSCOPIC RETROGRADE CHOLANGIOPANCREATOGRAPHY (ERCP) WITH PROPOFOL N/A 11/22/2022   Procedure: ENDOSCOPIC RETROGRADE CHOLANGIOPANCREATOGRAPHY (ERCP) WITH PROPOFOL;  Surgeon: Iva Boop, MD;  Location: [facility]  ENDOSCOPY;  Service: Gastroenterology;  Laterality: N/A;   ERCP N/A 12/02/2022   Procedure: ENDOSCOPIC RETROGRADE CHOLANGIOPANCREATOGRAPHY (ERCP);  Surgeon: [name]., MD;  Location: [facility] ENDOSCOPY;  Service: Gastroenterology;  Laterality: N/A;   ESOPHAGOGASTRODUODENOSCOPY N/A 03/03/2000   ESOPHAGOGASTRODUODENOSCOPY (EGD) WITH PROPOFOL N/A 10/16/2021   Procedure: ESOPHAGOGASTRODUODENOSCOPY (EGD) WITH PROPOFOL;  Surgeon: Regis Bill, MD;  Location: [facility] ENDOSCOPY;  Service: Endoscopy;  Laterality: N/A;    ESOPHAGOGASTRODUODENOSCOPY (EGD) WITH PROPOFOL N/A 01/31/2023   Procedure: ESOPHAGOGASTRODUODENOSCOPY (EGD) WITH PROPOFOL;  Surgeon: Imogene Burn, MD;  Location: [facility] ENDOSCOPY;  Service: Gastroenterology;  Laterality: N/A;   ESOPHAGOGASTRODUODENOSCOPY (EGD) WITH PROPOFOL N/A 02/16/2023   Procedure: ESOPHAGOGASTRODUODENOSCOPY (EGD) WITH PROPOFOL;  Surgeon: Meridee Score Netty Starring., MD;  Location: [facility] ENDOSCOPY;  Service: Gastroenterology;  Laterality: N/A;   EUS N/A 02/16/2023   Procedure: UPPER ENDOSCOPIC ULTRASOUND (EUS) LINEAR;  Surgeon: [name]., MD;  Location: [facility] ENDOSCOPY;  Service: Gastroenterology;  Laterality: N/A;   EXTRACORPOREAL SHOCK WAVE LITHOTRIPSY Left 08/06/2021   Procedure: EXTRACORPOREAL SHOCK WAVE LITHOTRIPSY (ESWL);  Surgeon: Vanna Scotland, MD;  Location: [facility] ORS;  Service: Urology;  Laterality: Left;   INGUINAL HERNIA REPAIR Right 11/27/2021   Procedure: HERNIA REPAIR INGUINAL ADULT;  Surgeon: Earline Mayotte, MD;  Location: [facility] ORS;  Service: General;  Laterality: Right;   INTRAOCULAR LENS EXCHANGE Left 12/27/2018   Procedure: MECHANICAL VITRECTOMY,  EXCHANGE LENS PROSTHESIS VIA PARS PLANA APPROACH; Location: [facility]; Surgeon: Nicholaus Corolla, MD   MOHS SURGERY Bilateral 01/27/2015   Procedure: MOHS SURGERY TO REMOVE BASAL CELL CARCINOMA FROM RIGHT/LEFT NASAL ALA; Location: [facility]; Surgeon: Katrine Coho, MD   REMOVAL OF STONES  12/02/2022   Procedure: REMOVAL OF STONES;  Surgeon: Meridee Score Netty Starring., MD;  Location: [facility] ENDOSCOPY;  Service: Gastroenterology;;   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   Procedure: RPR RETINAL DTCHMNT W/VITRECTOMY ANY METH REPAIR RETINAL DETACHMENT; WITH VITRECTOMY, INC, WHEN PERFORMED, AIR/GAS TAMPONADE, FOCAL ENDOLASER PHOTOCOAGULATION, CRYOTHERAPY, DRAINAGE OF SUBRETINAL FLUID, SCLERAL BUCKLING AND/OR REMOVAL OF LENS; Location: [facility]; Surgeon: Nicholaus Corolla, MD   SINUSOTOMY N/A    Procedure: MAXILLARY SINUSOTOMY INTRANASAL    SPHINCTEROTOMY  12/02/2022   Procedure: SPHINCTEROTOMY;  Surgeon: Mansouraty, Netty Starring., MD;  Location: [facility] ENDOSCOPY;  Service: Gastroenterology;;   TONSILLECTOMY AND ADENOIDECTOMY N/A    Patient Active Problem List   Diagnosis Date Noted   Hypokalemia 02/16/2023   Gastric outflow obstruction 02/14/2023   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   Acute recurrent pancreatitis 02/13/2023   Intractable nausea and vomiting 02/13/2023   Displaced nasogastric feeding tube 02/12/2023   Esophageal stenosis 02/12/2023   Chronic pancreatitis (HCC) 02/12/2023   Leukocytosis 02/12/2023   Essential hypertension 02/12/2023   History of CAD (coronary artery disease) 02/12/2023   Atrial fibrillation, chronic -not on coagulation (HCC) 02/12/2023   Chronic disease anemia 02/12/2023   Encounter for feeding tube placement 02/12/2023   Malnutrition of moderate degree 02/01/2023   Nausea and vomiting 01/31/2023   Abnormal CT scan 01/31/2023   Acute pancreatitis 01/28/2023   Chronic diarrhea 12/05/2022   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   Hemorrhagic pancreatitis 11/30/2022   History of ERCP 11/30/2022   Biliary obstruction 11/30/2022   Moderate protein-calorie malnutrition (HCC) 11/30/2022   Gallstone pancreatitis 11/30/2022   Choledocholithiasis 11/21/2022   Acute gallstone pancreatitis 11/17/2022   PAF (paroxysmal atrial fibrillation) (HCC) 11/16/2021   Diabetes type 2, controlled (HCC) 05/15/2018   Hyperlipidemia, mixed 10/28/2015   CAD (coronary artery disease) 05/12/2013   GERD (gastroesophageal reflux disease) 05/12/2013    ONSET DATE:  10 months ago following multiple surgeries   REFERRING DIAG: Paralysis of vocal cords and larynx, 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 Pt with PEG placement for pancreatitis.  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 Gradual improvement over time. CT was WNL. Pt reports had repeat laryngoscopy since my last visit and was told it was stronger. Mirror exam today shows good movement and patient's symptoms are improving. Recommend continue home speech exercises.   PAIN:  Are you having pain? No   FALLS: Has patient fallen in last 6 months? No,   LIVING ENVIRONMENT: Lives with: lives with their family Lives in: House/apartment  PLOF: Independent  PATIENT GOALS    to improve vocal quality  OBJECTIVE:  COGNITION: Overall cognitive status: Within functional limits for tasks assessed  SOCIAL HISTORY: Occupation: retired, talks a lot to his wife Water intake: suboptimal Caffeine/alcohol intake: minimal Daily voice use: moderate Environmental risks: Noisy environment and Stressful environment Occupational risks: None identified Misuse: Speaks excessively, Hyperfunction, Excessively high pitch, Strain, Tension, Speaks without adequate warm-up, Speaks without adequate breath support, and Speaks on residual capacity Phonotraumatic behaviors: Excessive voice use, Excessive voice use during colds/illnesses, and Excessive and/or habitual throat clearing  PERCEPTUAL VOICE ASSESSMENT: Voice quality: hoarse, breathy, harsh, rough, strained, low vocal intensity, and vocal fatigue Vocal abuse: habitual throat clearing, excessive voice use, and habitual abnormal pitch Resonance: normal Respiratory function: clavicular breathing  OBJECTIVE VOICE ASSESSMENT: Sustained "ah" maximum phonation time: 7.4 seconds Sustained "ah" loudness average: 76 dB Average fundamental frequency during sustained "ah":145 Hz   (Within the SD  for average of  145 Hz +/- 23 for gender)  Oral reading (passage) loudness average: 75 dB Oral reading loudness range: 14 dB Conversational pitch average: 138 Hz Highest dynamic pitch in conversational speech: 191 Hz Lowest dynamic pitch in conversational speech: 114 Hz Conversational pitch range: 77 Hz Conversational loudness average: 75 dB Conversational loudness range: 14 dB S/z ratio: 1.01 (Suggestive of dysfunction >1.0) Voice quality: hoarse, breathy, harsh, rough, strained, low vocal intensity, diplophonia, and vocal fatigue     ORAL MOTOR EXAMINATION Facial : WFL Lingual: WFL Velum: WFL Mandible: WFL Cough: Weak Voice: Hoarse, Strained, Breathy, Weak   PATIENT REPORTED OUTCOME MEASURES (PROM): To be completed in next 3 sessions   TODAY'S TREATMENT:  N/A   PATIENT EDUCATION: Education details: results of this assessment, ST POC Person educated: Patient Education method: Explanation Education comprehension: needs further education   HOME EXERCISE PROGRAM: N/A     GOALS: Goals reviewed with patient? Yes  SHORT TERM GOALS: Target date: 10 sessions  The patient will maximize voice quality and loudness using breath support/oral resonance for sustained vowel production, pitch glides, and hierarchal speech drill.  Baseline: Goal status: INITIAL  2.  The patient will demonstrate abdominal breathing patterns and steady release of breath on exhalation to optimize efficiency of voicing and decrease laryngeal hyperfunction.  Baseline:  Goal status: INITIAL  3.  The patient will increase hydration for an eventual goal of 6-8 glasses per day and limit caffeine intake (to maximum of 1-2, 8 oz cups/day), as measured by patient report.  Baseline:  Goal status: INITIAL  LONG TERM GOALS: Target date: 07/13/2023  The patient will participate in 5-8 minutes conversation, maintaining average loudness of 75 dB and loud, good quality voice with min cues.   Baseline:   Goal status: INITIAL  2.  The patient will be independent for abdominal breathing and breath support exercises.  Baseline:  Goal status: INITIAL  3.  Patient will report improved communication effectiveness as measured by PROM Baseline:  Goal status: INITIAL   ASSESSMENT:  CLINICAL IMPRESSION: Patient is a 82 y.o. 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   OBJECTIVE IMPAIRMENTS include voice disorder. These impairments are limiting patient from effectively communicating at home and in community. Factors affecting potential to achieve goals and functional outcome are  decreased PO consumption . Patient will benefit from skilled SLP services to address above impairments and improve overall function.  REHAB POTENTIAL: Good  PLAN: SLP FREQUENCY: 1-2x/week  SLP DURATION: 8 weeks  PLANNED INTERVENTIONS: SLP instruction and feedback and Patient/family education   Tameyah Koch B. Dreama Saa, M.S., CCC-SLP, Tree surgeon Certified Brain Injury Specialist [facility] [employer] [facility]  [facility] Rehabilitation Services Office [phone] Ascom [phone] Fax [phone]

## 2023-05-18 NOTE — Therapy (Signed)
OUTPATIENT PHYSICAL THERAPY NEURO TREATMENT   Patient Name: Jon Bray MRN: [id number] DOB:02-09-1941, 82 y.o., male Today's Date: 05/18/2023   PCP: Danella Penton, MD REFERRING PROVIDER: Danella Penton, MD  END OF SESSION:  PT End of Session - 05/18/23 0841     Visit Number 3    Number of Visits 24    Date for PT Re-Evaluation 08/02/23    Progress Note Due on Visit 10    PT Start Time 0845    PT Stop Time 0928    PT Time Calculation (min) 43 min    Equipment Utilized During Treatment Gait belt    Activity Tolerance Patient tolerated treatment well    Behavior During Therapy [facility] for tasks assessed/performed             Past Medical History:  Diagnosis Date   Anginal pain (HCC)    Aortic atherosclerosis (HCC)    Aortic root enlargement (HCC) 12/27/2017   a.) cCTA 12/27/2017: measured 3.9 cm.   Basal cell carcinoma of skin    a.) s/p Mohs procedure R/L nasal ala 01/27/2015   CAD (coronary artery disease) 05/12/2013   a.) STEMI --> LHC 05/12/2013: 20% p-mLAD, 20% D2, 40% LPDA, 30% LPAV, 99% dLCx --> PCI placing 4.5 x 20 mm and 2.5 x 12 mm Veriflex BMS to LPAV extending to dLCx. b.) cCTA 12/27/2017: Ca score 1578; 82nd percentile for age/sex match control; CT-FFR: RCA 0.99, pLAD 0.97, mLAD 0.98, dLAD 0.89, pLCx 0.98, mLCx 0.97, dLCx 0.97, PLB 0.91, PDA (most distal segment) 0.80.   Cataract    a.) s/p extraction with IOL placement   CKD (chronic kidney disease)    Diverticulosis    GERD (gastroesophageal reflux disease)    History of kidney stones 2023   HLD (hyperlipidemia)    Left lower lobe pulmonary nodule 08/01/2021   a.) CT 08/01/2021: measured 4 mm; perifissural   Long term current use of anticoagulant    a.) apixaban   NSVT (nonsustained ventricular tachycardia) (HCC) 05/13/2013   a.) 24 beat run while in ICU following PCI for STEMI   PAF (paroxysmal atrial fibrillation) (HCC) 11/16/2021   a.) new onset in ED on 11/16/2021. b.) CHA2DS2-VASc = 4 (age x 2,  STEMI/aortic plaque, T2DM). c.) rate controlled without pharmacological intervention; started on apixaban 11/16/2021, however PCP discontinued on 11/18/2021 pending Holter study results.   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   Type 2 diabetes, diet controlled [facility])    Past Surgical History:  Procedure Laterality Date   BIOPSY  12/02/2022   Procedure: BIOPSY;  Surgeon: Meridee Score Netty Starring., MD;  Location: [facility] ENDOSCOPY;  Service: Gastroenterology;;   BIOPSY  01/31/2023   Procedure: BIOPSY;  Surgeon: Imogene Burn, MD;  Location: [facility] ENDOSCOPY;  Service: Gastroenterology;;   CATARACT EXTRACTION W/ INTRAOCULAR LENS IMPLANT Bilateral    CHOLECYSTECTOMY N/A 12/03/2022   Procedure: LAPAROSCOPIC CHOLECYSTECTOMY;  Surgeon: [name], MD;  Location: [facility] OR;  Service: General;  Laterality: N/A;   COLONOSCOPY N/A 01/31/2001   COLONOSCOPY N/A 11/19/2008   CORONARY ANGIOPLASTY WITH STENT PLACEMENT Left 05/12/2013   Procedure: CORONARY ANGIOPLASTY WITH STENT PLACEMENT; Location: [facility]; Surgeon: Lieutenant Diego, MD   ENDOSCOPIC RETROGRADE CHOLANGIOPANCREATOGRAPHY (ERCP) WITH PROPOFOL N/A  11/22/2022   Procedure: ENDOSCOPIC RETROGRADE CHOLANGIOPANCREATOGRAPHY (ERCP) WITH PROPOFOL;  Surgeon: Iva Boop, MD;  Location: [facility] ENDOSCOPY;  Service: Gastroenterology;  Laterality: N/A;   ERCP N/A 12/02/2022   Procedure: ENDOSCOPIC RETROGRADE CHOLANGIOPANCREATOGRAPHY (ERCP);  Surgeon: [name]., MD;  Location: [facility] ENDOSCOPY;  Service: Gastroenterology;  Laterality: N/A;   ESOPHAGOGASTRODUODENOSCOPY N/A 03/03/2000   ESOPHAGOGASTRODUODENOSCOPY (EGD) WITH PROPOFOL N/A 10/16/2021   Procedure: ESOPHAGOGASTRODUODENOSCOPY (EGD) WITH PROPOFOL;  Surgeon: Regis Bill, MD;   Location: [facility] ENDOSCOPY;  Service: Endoscopy;  Laterality: N/A;   ESOPHAGOGASTRODUODENOSCOPY (EGD) WITH PROPOFOL N/A 01/31/2023   Procedure: ESOPHAGOGASTRODUODENOSCOPY (EGD) WITH PROPOFOL;  Surgeon: Imogene Burn, MD;  Location: [facility] ENDOSCOPY;  Service: Gastroenterology;  Laterality: N/A;   ESOPHAGOGASTRODUODENOSCOPY (EGD) WITH PROPOFOL N/A 02/16/2023   Procedure: ESOPHAGOGASTRODUODENOSCOPY (EGD) WITH PROPOFOL;  Surgeon: Meridee Score Netty Starring., MD;  Location: [facility] ENDOSCOPY;  Service: Gastroenterology;  Laterality: N/A;   EUS N/A 02/16/2023   Procedure: UPPER ENDOSCOPIC ULTRASOUND (EUS) LINEAR;  Surgeon: [name]., MD;  Location: [facility] ENDOSCOPY;  Service: Gastroenterology;  Laterality: N/A;   EXTRACORPOREAL SHOCK WAVE LITHOTRIPSY Left 08/06/2021   Procedure: EXTRACORPOREAL SHOCK WAVE LITHOTRIPSY (ESWL);  Surgeon: Vanna Scotland, MD;  Location: [facility] ORS;  Service: Urology;  Laterality: Left;   INGUINAL HERNIA REPAIR Right 11/27/2021   Procedure: HERNIA REPAIR INGUINAL ADULT;  Surgeon: Earline Mayotte, MD;  Location: [facility] ORS;  Service: General;  Laterality: Right;   INTRAOCULAR LENS EXCHANGE Left 12/27/2018   Procedure: MECHANICAL VITRECTOMY,  EXCHANGE LENS PROSTHESIS VIA PARS PLANA APPROACH; Location: [facility]; Surgeon: Nicholaus Corolla, MD   MOHS SURGERY Bilateral 01/27/2015   Procedure: MOHS SURGERY TO REMOVE BASAL CELL CARCINOMA FROM RIGHT/LEFT NASAL ALA; Location: [facility]; Surgeon: Katrine Coho, MD   REMOVAL OF STONES  12/02/2022   Procedure: REMOVAL OF STONES;  Surgeon: Meridee Score Netty Starring., MD;  Location: [facility] ENDOSCOPY;  Service: Gastroenterology;;   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   Procedure: RPR RETINAL DTCHMNT W/VITRECTOMY ANY METH REPAIR RETINAL DETACHMENT; WITH VITRECTOMY, INC, WHEN PERFORMED, AIR/GAS TAMPONADE, FOCAL ENDOLASER PHOTOCOAGULATION, CRYOTHERAPY, DRAINAGE OF SUBRETINAL FLUID, SCLERAL BUCKLING AND/OR REMOVAL OF LENS; Location: [facility]; Surgeon: Nicholaus Corolla, MD    SINUSOTOMY N/A    Procedure: MAXILLARY SINUSOTOMY INTRANASAL   SPHINCTEROTOMY  12/02/2022   Procedure: SPHINCTEROTOMY;  Surgeon: Mansouraty, Netty Starring., MD;  Location: [facility] ENDOSCOPY;  Service: Gastroenterology;;   TONSILLECTOMY AND ADENOIDECTOMY N/A    Patient Active Problem List   Diagnosis Date Noted   Hypokalemia 02/16/2023   Gastric outflow obstruction 02/14/2023   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   Acute recurrent pancreatitis 02/13/2023   Intractable nausea and vomiting 02/13/2023   Displaced nasogastric feeding tube 02/12/2023   Esophageal stenosis 02/12/2023   Chronic pancreatitis (HCC) 02/12/2023   Leukocytosis 02/12/2023   Essential hypertension 02/12/2023   History of CAD (coronary artery disease) 02/12/2023   Atrial fibrillation, chronic -not on coagulation (HCC) 02/12/2023   Chronic disease anemia 02/12/2023   Encounter for feeding tube placement 02/12/2023   Malnutrition of moderate degree 02/01/2023   Nausea and vomiting 01/31/2023   Abnormal CT scan 01/31/2023   Acute pancreatitis 01/28/2023   Chronic diarrhea 12/05/2022   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   Hemorrhagic pancreatitis 11/30/2022   History of ERCP 11/30/2022   Biliary obstruction 11/30/2022   Moderate protein-calorie malnutrition (HCC) 11/30/2022   Gallstone pancreatitis 11/30/2022   Choledocholithiasis 11/21/2022   Acute gallstone pancreatitis 11/17/2022   PAF (paroxysmal atrial fibrillation) (HCC) 11/16/2021   Diabetes type 2, controlled (HCC) 05/15/2018   Hyperlipidemia, mixed 10/28/2015  CAD (coronary artery disease) 05/12/2013   GERD (gastroesophageal reflux disease) 05/12/2013    ONSET DATE: 11/03/2022  REFERRING DIAG: R53.1 (ICD-10-CM) - Weakness   THERAPY DIAG:  Abnormality of gait and mobility  Difficulty in walking, not elsewhere classified  Other abnormalities of gait and mobility  Muscle weakness (generalized)  Rationale for Evaluation and Treatment:  Rehabilitation  SUBJECTIVE:                                                                                                                                                                                             SUBJECTIVE STATEMENT: Pt reports that he has been practicing exercises prescribed in HEP since last visit. Pt reported 0/10 on NPS and no falls since last visit.   Pt accompanied by: self  PERTINENT HISTORY: Pancreatitis, CAD, GERD, Hyperlipidemia, Diabetes Type 2, Paroxysmal Atrial Fibrillation, Malnutrition, Hypertension From initial eval: Pt stated that he was diagnosed with pancreatitis in May of 2024 and has been in and out of the hospital ever since. Pt stated that he lost 35 lbs in 2 weeks between May & June of 2024 and has had difficulty gaining weight since then. Pt stated that he has been utilizing a feeding tube for the past 45 days. Ever since diagnosis of pancreatitis, pt feels that he has lost muscle tone, muscle strength, and endurance. Pt stated that he goes to the gym 2 days a week. Pt has had PT at [facility] rehab, as well as home health and was told he needed PT that was more advanced. Pt feels like balance has been regressing ever since hospitalization in May. 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      PAIN:  Are you having pain? No  PRECAUTIONS: None  RED FLAGS: None   WEIGHT BEARING RESTRICTIONS: No  FALLS: Has patient fallen in last 6 months? No  LIVING ENVIRONMENT: Lives with: lives with their family and lives with their spouse Lives in: House/apartment Stairs: Yes: External: 2 steps; on right going up Has following equipment at home: Single point cane  PLOF: Independent  PATIENT GOALS: Increase muscle strength, endurance, and get back to being able to [place]   OBJECTIVE:  Note: Objective measures were completed at Evaluation unless otherwise noted.  DIAGNOSTIC FINDINGS:  N/A  COGNITION: Overall cognitive status: Within functional limits for tasks assessed   SENSATION: Not tested  COORDINATION: WFL  EDEMA:  Not tested   MUSCLE TONE: Not tested  MUSCLE LENGTH: Not tested   DTRs:  Not tested  POSTURE: No Significant postural limitations  LOWER EXTREMITY ROM:     Active  Right Eval Left Eval  Hip flexion    Hip extension    Hip abduction    Hip adduction    Hip internal rotation    Hip external rotation    Knee flexion    Knee extension    Ankle dorsiflexion    Ankle plantarflexion    Ankle inversion    Ankle eversion     (Blank rows = not tested)  LOWER EXTREMITY MMT:    MMT Right Eval Left Eval  Hip flexion 4 4-  Hip extension    Hip abduction    Hip adduction    Hip internal rotation    Hip external rotation    Knee flexion    Knee extension 4 4  Ankle dorsiflexion 4 4-  Ankle plantarflexion    Ankle inversion    Ankle eversion    (Blank rows = not tested)  BED MOBILITY:  WFL  TRANSFERS: Assistive device utilized: N/A  Sit to stand: Complete Independence Stand to sit: Complete Independence Chair to chair: Complete Independence Floor:  Not tested  RAMP:  Level of Assistance:  Not tested  Assistive device utilized: N/A  CURB:  Level of Assistance:  Not tested Assistive device utilized: N/A  STAIRS: Level of Assistance:  Not tested  GAIT: Gait pattern: WFL Distance walked: 20 meters Assistive device utilized:  None Level of assistance: SBA  FUNCTIONAL TESTS:  5 times sit to stand: 10 seconds 10 meter walk test: 6.16 seconds  PATIENT SURVEYS:  FOTO 56  TODAY'S TREATMENT:                                                                                                                              DATE: 05/18/2023  TE   Sit to stand without UE support 2x10 Standing calf stretch on slant board without UE support 1 minute Reverse Lunges with UE support x 10 reps each LE   NMR:  Static  Stance on inverted slant board without UE support 1 minute Step taps onto 6" step x 10 reps alt LE without UE support Step ups on 6" step  x 10 reps alt LE without UE support Standing staggered (front leg on 6" step and back LE on airex) 2x30 sec each LE Static tandem standing on airex beam 2x30 sec each LE Tandem walking on airex beam without UE support 10 reps SLS 2x30 sec ea LE with fingertip support      PATIENT EDUCATION: Education details: POC Person educated: Patient Education method: Explanation Education comprehension: verbalized understanding   HOME EXERCISE PROGRAM: Access Code: [email or web address] URL: [email or web address] Date: 05/13/2023 Prepared by: Thresa Ross  Exercises - Standing Single Leg Stance with Counter Support  - 1 x daily - 7 x weekly - 3 sets - 30 sec  hold - Standing Heel Raise  - 1 x daily - 7 x weekly - 2 sets - 10 reps -  3 second hold  HOME EXERCISE PROGRAM: To be administered next visit  GOALS: Goals reviewed with patient? No  SHORT TERM GOALS: Target date: 06/07/2023    Patient will be independent in home exercise program to improve strength/mobility for better functional independence with ADLs. Baseline: No HEP currently  Goal status: INITIAL    LONG TERM GOALS: Target date: 08/02/2023    Pt will increase FOTO score to at least 65 in order to demonstrate increased independence with ADLs.  Baseline: 05/10/23: 56 Goal status: INITIAL  2.  Pt will decrease 5xSTS by 2 seconds in order to demonstrate increased functional mobility strength in lower extremities.  Baseline: 05/10/23: 10 seconds  Goal status: INITIAL  3.  Pt will improve 200 feet or more during the in order to demonstrate increased endurance to perform IADLs.  Baseline: 11/8: 1070 ft  Goal status: INITIAL  4.  Pt will increase Mini Best Balance test score by 4 points in order to demonstrate increased balance, so that the patient can safely play a full game  of golf.  Baseline: 11/8:22 Goal status: INITIAL      ASSESSMENT:  CLINICAL IMPRESSION:  Pt tolerated all tasks during today's visit. Pt stated that he is being compliant with HEP and was able to complete SLS on RLE for 29 seconds and on LLE for 15 seconds, which is already improvement from last visit. Pt reported fatigue in bilateral LE's after today's visit, but stated that he felt accomplished as well. Pt will continue to benefit from skilled physical therapy intervention to address impairments, improve QOL, and attain therapy goals.    OBJECTIVE IMPAIRMENTS: decreased endurance.   ACTIVITY LIMITATIONS:  N/A  PARTICIPATION LIMITATIONS:  N/A  PERSONAL FACTORS: 1 comorbidity: pancreatitis  are also affecting patient's functional outcome.   REHAB POTENTIAL: Good  CLINICAL DECISION MAKING: Evolving/moderate complexity  EVALUATION COMPLEXITY: Low  PLAN:  PT FREQUENCY: 2x/week  PT DURATION: 12 weeks  PLANNED INTERVENTIONS: 97110-Therapeutic exercises, 97530- Therapeutic activity, 97112- Neuromuscular re-education, 97535- Self Care, and [id number]- Manual therapy  PLAN FOR NEXT SESSION: endurance, dynamic balance program, progress HEP as indicated.    Lenda Kelp, PT 05/18/2023, 2:24 PM

## 2023-05-23 ENCOUNTER — Ambulatory Visit: Payer: HMO

## 2023-05-23 ENCOUNTER — Ambulatory Visit: Payer: HMO | Admitting: Speech Pathology

## 2023-05-23 DIAGNOSIS — R2689 Other abnormalities of gait and mobility: Secondary | ICD-10-CM

## 2023-05-23 DIAGNOSIS — M6281 Muscle weakness (generalized): Secondary | ICD-10-CM

## 2023-05-23 DIAGNOSIS — N189 Chronic kidney disease, unspecified: Secondary | ICD-10-CM | POA: Diagnosis not present

## 2023-05-23 DIAGNOSIS — E43 Unspecified severe protein-calorie malnutrition: Secondary | ICD-10-CM | POA: Diagnosis not present

## 2023-05-23 DIAGNOSIS — I480 Paroxysmal atrial fibrillation: Secondary | ICD-10-CM | POA: Diagnosis not present

## 2023-05-23 DIAGNOSIS — E1122 Type 2 diabetes mellitus with diabetic chronic kidney disease: Secondary | ICD-10-CM | POA: Diagnosis not present

## 2023-05-23 DIAGNOSIS — I2510 Atherosclerotic heart disease of native coronary artery without angina pectoris: Secondary | ICD-10-CM | POA: Diagnosis not present

## 2023-05-23 DIAGNOSIS — E1151 Type 2 diabetes mellitus with diabetic peripheral angiopathy without gangrene: Secondary | ICD-10-CM | POA: Diagnosis not present

## 2023-05-23 DIAGNOSIS — E440 Moderate protein-calorie malnutrition: Secondary | ICD-10-CM | POA: Diagnosis not present

## 2023-05-23 DIAGNOSIS — K863 Pseudocyst of pancreas: Secondary | ICD-10-CM | POA: Diagnosis not present

## 2023-05-23 DIAGNOSIS — R262 Difficulty in walking, not elsewhere classified: Secondary | ICD-10-CM | POA: Diagnosis not present

## 2023-05-23 DIAGNOSIS — I482 Chronic atrial fibrillation, unspecified: Secondary | ICD-10-CM | POA: Diagnosis not present

## 2023-05-23 DIAGNOSIS — R269 Unspecified abnormalities of gait and mobility: Secondary | ICD-10-CM

## 2023-05-23 DIAGNOSIS — K8592 Acute pancreatitis with infected necrosis, unspecified: Secondary | ICD-10-CM | POA: Diagnosis not present

## 2023-05-23 DIAGNOSIS — Z794 Long term (current) use of insulin: Secondary | ICD-10-CM | POA: Diagnosis not present

## 2023-05-23 NOTE — Therapy (Signed)
OUTPATIENT PHYSICAL THERAPY NEURO TREATMENT   Patient Name: Jon Bray MRN: [id number] DOB:11/26/1940, 82 y.o., male Today's Date: 05/23/2023   PCP: Danella Penton, MD REFERRING PROVIDER: Danella Penton, MD  END OF SESSION:  PT End of Session - 05/23/23 0802     Visit Number 4    Number of Visits 24    Date for PT Re-Evaluation 08/02/23    Progress Note Due on Visit 10    PT Start Time 0804    PT Stop Time 0843    PT Time Calculation (min) 39 min    Equipment Utilized During Treatment Gait belt    Activity Tolerance Patient tolerated treatment well    Behavior During Therapy [facility] for tasks assessed/performed             Past Medical History:  Diagnosis Date   Anginal pain (HCC)    Aortic atherosclerosis (HCC)    Aortic root enlargement (HCC) 12/27/2017   a.) cCTA 12/27/2017: measured 3.9 cm.   Basal cell carcinoma of skin    a.) s/p Mohs procedure R/L nasal ala 01/27/2015   CAD (coronary artery disease) 05/12/2013   a.) STEMI --> LHC 05/12/2013: 20% p-mLAD, 20% D2, 40% LPDA, 30% LPAV, 99% dLCx --> PCI placing 4.5 x 20 mm and 2.5 x 12 mm Veriflex BMS to LPAV extending to dLCx. b.) cCTA 12/27/2017: Ca score 1578; 82nd percentile for age/sex match control; CT-FFR: RCA 0.99, pLAD 0.97, mLAD 0.98, dLAD 0.89, pLCx 0.98, mLCx 0.97, dLCx 0.97, PLB 0.91, PDA (most distal segment) 0.80.   Cataract    a.) s/p extraction with IOL placement   CKD (chronic kidney disease)    Diverticulosis    GERD (gastroesophageal reflux disease)    History of kidney stones 2023   HLD (hyperlipidemia)    Left lower lobe pulmonary nodule 08/01/2021   a.) CT 08/01/2021: measured 4 mm; perifissural   Long term current use of anticoagulant    a.) apixaban   NSVT (nonsustained ventricular tachycardia) (HCC) 05/13/2013   a.) 24 beat run while in ICU following PCI for STEMI   PAF (paroxysmal atrial fibrillation) (HCC) 11/16/2021   a.) new onset in ED on 11/16/2021. b.) CHA2DS2-VASc = 4 (age x 2,  STEMI/aortic plaque, T2DM). c.) rate controlled without pharmacological intervention; started on apixaban 11/16/2021, however PCP discontinued on 11/18/2021 pending Holter study results.   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   Type 2 diabetes, diet controlled [facility])    Past Surgical History:  Procedure Laterality Date   BIOPSY  12/02/2022   Procedure: BIOPSY;  Surgeon: Meridee Score Netty Starring., MD;  Location: [facility] ENDOSCOPY;  Service: Gastroenterology;;   BIOPSY  01/31/2023   Procedure: BIOPSY;  Surgeon: Imogene Burn, MD;  Location: [facility] ENDOSCOPY;  Service: Gastroenterology;;   CATARACT EXTRACTION W/ INTRAOCULAR LENS IMPLANT Bilateral    CHOLECYSTECTOMY N/A 12/03/2022   Procedure: LAPAROSCOPIC CHOLECYSTECTOMY;  Surgeon: [name], MD;  Location: [facility] OR;  Service: General;  Laterality: N/A;   COLONOSCOPY N/A 01/31/2001   COLONOSCOPY N/A 11/19/2008   CORONARY ANGIOPLASTY WITH STENT PLACEMENT Left 05/12/2013   Procedure: CORONARY ANGIOPLASTY WITH STENT PLACEMENT; Location: [facility]; Surgeon: Lieutenant Diego, MD   ENDOSCOPIC RETROGRADE CHOLANGIOPANCREATOGRAPHY (ERCP) WITH PROPOFOL N/A  11/22/2022   Procedure: ENDOSCOPIC RETROGRADE CHOLANGIOPANCREATOGRAPHY (ERCP) WITH PROPOFOL;  Surgeon: Iva Boop, MD;  Location: [facility] ENDOSCOPY;  Service: Gastroenterology;  Laterality: N/A;   ERCP N/A 12/02/2022   Procedure: ENDOSCOPIC RETROGRADE CHOLANGIOPANCREATOGRAPHY (ERCP);  Surgeon: [name]., MD;  Location: [facility] ENDOSCOPY;  Service: Gastroenterology;  Laterality: N/A;   ESOPHAGOGASTRODUODENOSCOPY N/A 03/03/2000   ESOPHAGOGASTRODUODENOSCOPY (EGD) WITH PROPOFOL N/A 10/16/2021   Procedure: ESOPHAGOGASTRODUODENOSCOPY (EGD) WITH PROPOFOL;  Surgeon: Regis Bill, MD;   Location: [facility] ENDOSCOPY;  Service: Endoscopy;  Laterality: N/A;   ESOPHAGOGASTRODUODENOSCOPY (EGD) WITH PROPOFOL N/A 01/31/2023   Procedure: ESOPHAGOGASTRODUODENOSCOPY (EGD) WITH PROPOFOL;  Surgeon: Imogene Burn, MD;  Location: [facility] ENDOSCOPY;  Service: Gastroenterology;  Laterality: N/A;   ESOPHAGOGASTRODUODENOSCOPY (EGD) WITH PROPOFOL N/A 02/16/2023   Procedure: ESOPHAGOGASTRODUODENOSCOPY (EGD) WITH PROPOFOL;  Surgeon: Meridee Score Netty Starring., MD;  Location: [facility] ENDOSCOPY;  Service: Gastroenterology;  Laterality: N/A;   EUS N/A 02/16/2023   Procedure: UPPER ENDOSCOPIC ULTRASOUND (EUS) LINEAR;  Surgeon: [name]., MD;  Location: [facility] ENDOSCOPY;  Service: Gastroenterology;  Laterality: N/A;   EXTRACORPOREAL SHOCK WAVE LITHOTRIPSY Left 08/06/2021   Procedure: EXTRACORPOREAL SHOCK WAVE LITHOTRIPSY (ESWL);  Surgeon: Vanna Scotland, MD;  Location: [facility] ORS;  Service: Urology;  Laterality: Left;   INGUINAL HERNIA REPAIR Right 11/27/2021   Procedure: HERNIA REPAIR INGUINAL ADULT;  Surgeon: Earline Mayotte, MD;  Location: [facility] ORS;  Service: General;  Laterality: Right;   INTRAOCULAR LENS EXCHANGE Left 12/27/2018   Procedure: MECHANICAL VITRECTOMY,  EXCHANGE LENS PROSTHESIS VIA PARS PLANA APPROACH; Location: [facility]; Surgeon: Nicholaus Corolla, MD   MOHS SURGERY Bilateral 01/27/2015   Procedure: MOHS SURGERY TO REMOVE BASAL CELL CARCINOMA FROM RIGHT/LEFT NASAL ALA; Location: [facility]; Surgeon: Katrine Coho, MD   REMOVAL OF STONES  12/02/2022   Procedure: REMOVAL OF STONES;  Surgeon: Meridee Score Netty Starring., MD;  Location: [facility] ENDOSCOPY;  Service: Gastroenterology;;   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   Procedure: RPR RETINAL DTCHMNT W/VITRECTOMY ANY METH REPAIR RETINAL DETACHMENT; WITH VITRECTOMY, INC, WHEN PERFORMED, AIR/GAS TAMPONADE, FOCAL ENDOLASER PHOTOCOAGULATION, CRYOTHERAPY, DRAINAGE OF SUBRETINAL FLUID, SCLERAL BUCKLING AND/OR REMOVAL OF LENS; Location: [facility]; Surgeon: Nicholaus Corolla, MD    SINUSOTOMY N/A    Procedure: MAXILLARY SINUSOTOMY INTRANASAL   SPHINCTEROTOMY  12/02/2022   Procedure: SPHINCTEROTOMY;  Surgeon: Mansouraty, Netty Starring., MD;  Location: 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   Acute recurrent pancreatitis 02/13/2023   Intractable nausea and vomiting 02/13/2023   Displaced nasogastric feeding tube 02/12/2023   Esophageal stenosis 02/12/2023   Chronic pancreatitis (HCC) 02/12/2023   Leukocytosis 02/12/2023   Essential hypertension 02/12/2023   History of CAD (coronary artery disease) 02/12/2023   Atrial fibrillation, chronic -not on coagulation (HCC) 02/12/2023   Chronic disease anemia 02/12/2023   Encounter for feeding tube placement 02/12/2023   Malnutrition of moderate degree 02/01/2023   Nausea and vomiting 01/31/2023   Abnormal CT scan 01/31/2023   Acute pancreatitis 01/28/2023   Chronic diarrhea 12/05/2022   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   Hemorrhagic pancreatitis 11/30/2022   History of ERCP 11/30/2022   Biliary obstruction 11/30/2022   Moderate protein-calorie malnutrition (HCC) 11/30/2022   Gallstone pancreatitis 11/30/2022   Choledocholithiasis 11/21/2022   Acute gallstone pancreatitis 11/17/2022   PAF (paroxysmal atrial fibrillation) (HCC) 11/16/2021   Diabetes type 2, controlled (HCC) 05/15/2018   Hyperlipidemia, mixed 10/28/2015  CAD (coronary artery disease) 05/12/2013   GERD (gastroesophageal reflux disease) 05/12/2013    ONSET DATE: 11/03/2022  REFERRING DIAG: R53.1 (ICD-10-CM) - Weakness   THERAPY DIAG:  Abnormality of gait and mobility  Muscle weakness (generalized)  Difficulty in walking, not elsewhere classified  Other abnormalities of gait and mobility  Rationale for Evaluation and Treatment:  Rehabilitation  SUBJECTIVE:                                                                                                                                                                                             SUBJECTIVE STATEMENT: Pt stated that he feels that he is improving in HEP. Pt reported 0/10 on NPS and no falls since last visit.    Pt accompanied by: self  PERTINENT HISTORY: Pancreatitis, CAD, GERD, Hyperlipidemia, Diabetes Type 2, Paroxysmal Atrial Fibrillation, Malnutrition, Hypertension From initial eval: Pt stated that he was diagnosed with pancreatitis in May of 2024 and has been in and out of the hospital ever since. Pt stated that he lost 35 lbs in 2 weeks between May & June of 2024 and has had difficulty gaining weight since then. Pt stated that he has been utilizing a feeding tube for the past 45 days. Ever since diagnosis of pancreatitis, pt feels that he has lost muscle tone, muscle strength, and endurance. Pt stated that he goes to the gym 2 days a week. Pt has had PT at [facility] rehab, as well as home health and was told he needed PT that was more advanced. Pt feels like balance has been regressing ever since hospitalization in May. 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      PAIN:  Are you having pain? No  PRECAUTIONS: None  RED FLAGS: None   WEIGHT BEARING RESTRICTIONS: No  FALLS: Has patient fallen in last 6 months? No  LIVING ENVIRONMENT: Lives with: lives with their family and lives with their spouse Lives in: House/apartment Stairs: Yes: External: 2 steps; on right going up Has following equipment at home: Single point cane  PLOF: Independent  PATIENT GOALS: Increase muscle strength, endurance, and get back to being able to [place] drive [place]   OBJECTIVE:  Note: Objective measures were completed at Evaluation unless otherwise noted.  DIAGNOSTIC FINDINGS: N/A  COGNITION: Overall cognitive  status: Within functional limits for tasks assessed   SENSATION: Not tested  COORDINATION: WFL  EDEMA:  Not tested   MUSCLE TONE: Not tested  MUSCLE LENGTH: Not tested   DTRs:  Not tested  POSTURE: No Significant postural limitations  LOWER EXTREMITY  ROM:     Active  Right Eval Left Eval  Hip flexion    Hip extension    Hip abduction    Hip adduction    Hip internal rotation    Hip external rotation    Knee flexion    Knee extension    Ankle dorsiflexion    Ankle plantarflexion    Ankle inversion    Ankle eversion     (Blank rows = not tested)  LOWER EXTREMITY MMT:    MMT Right Eval Left Eval  Hip flexion 4 4-  Hip extension    Hip abduction    Hip adduction    Hip internal rotation    Hip external rotation    Knee flexion    Knee extension 4 4  Ankle dorsiflexion 4 4-  Ankle plantarflexion    Ankle inversion    Ankle eversion    (Blank rows = not tested)  BED MOBILITY:  WFL  TRANSFERS: Assistive device utilized: N/A  Sit to stand: Complete Independence Stand to sit: Complete Independence Chair to chair: Complete Independence Floor:  Not tested  RAMP:  Level of Assistance:  Not tested  Assistive device utilized: N/A  CURB:  Level of Assistance:  Not tested Assistive device utilized: N/A  STAIRS: Level of Assistance:  Not tested  GAIT: Gait pattern: WFL Distance walked: 20 meters Assistive device utilized:  None Level of assistance: SBA  FUNCTIONAL TESTS:  5 times sit to stand: 10 seconds 10 meter walk test: 6.16 seconds  PATIENT SURVEYS:  FOTO 56  TODAY'S TREATMENT:                                                                                                                              DATE: 05/23/2023  TE   Sit to stand without UE support 2x10 Standing calf stretch on slant board without UE support 1 minute Forward Lunges with UE support x 10 reps each LE Standing Hip Abductions 2# AW 2x10 Standing Hip  Extensions 2# AW 2x10   NMR:  Step taps onto 6" step x 10 reps alt LE without UE support Step ups on 6" step  x 10 reps alt LE without UE support Standing staggered (front leg on 6" step and back LE on airex) 2x30 sec each LE Static tandem standing on airex beam 2x30 sec each LE Tandem walking on airex beam without UE support 10 reps      PATIENT EDUCATION: Education details: POC Person educated: Patient Education method: Explanation Education comprehension: verbalized understanding   HOME EXERCISE PROGRAM: Access Code: [email or web address] URL: [email or web address] Date: 05/13/2023 Prepared by: Thresa Ross  Exercises - Standing Single Leg Stance with Counter Support  - 1 x daily - 7 x weekly - 3 sets - 30 sec  hold - Standing Heel Raise  - 1 x daily - 7 x weekly - 2 sets - 10 reps - 3 second hold  HOME EXERCISE PROGRAM: To  be administered next visit  GOALS: Goals reviewed with patient? No  SHORT TERM GOALS: Target date: 06/07/2023    Patient will be independent in home exercise program to improve strength/mobility for better functional independence with ADLs. Baseline: No HEP currently  Goal status: INITIAL    LONG TERM GOALS: Target date: 08/02/2023    Pt will increase FOTO score to at least 65 in order to demonstrate increased independence with ADLs.  Baseline: 05/10/23: 56 Goal status: INITIAL  2.  Pt will decrease 5xSTS by 2 seconds in order to demonstrate increased functional mobility strength in lower extremities.  Baseline: 05/10/23: 10 seconds  Goal status: INITIAL  3.  Pt will improve 200 feet or more during the in order to demonstrate increased endurance to perform IADLs.  Baseline: 11/8: 1070 ft  Goal status: INITIAL  4.  Pt will increase Mini Best Balance test score by 4 points in order to demonstrate increased balance, so that the patient can safely play a full game of golf.  Baseline: 11/8:22 Goal status:  INITIAL      ASSESSMENT:  CLINICAL IMPRESSION:  Pt tolerated all tasks during today's visit. Pt reported that he feels that SLS time is improving, as he is practicing it at home. Pt reported fatigue at the end of today's session, but accomplished at the same time. Pt stated that forward and reverse lunges have been one of the more difficult tasks over the course of PT so far. Pt will continue to benefit from skilled physical therapy intervention to address impairments, improve QOL, and attain therapy goals.    OBJECTIVE IMPAIRMENTS: decreased endurance.   ACTIVITY LIMITATIONS:  N/A  PARTICIPATION LIMITATIONS:  N/A  PERSONAL FACTORS: 1 comorbidity: pancreatitis  are also affecting patient's functional outcome.   REHAB POTENTIAL: Good  CLINICAL DECISION MAKING: Evolving/moderate complexity  EVALUATION COMPLEXITY: Low  PLAN:  PT FREQUENCY: 2x/week  PT DURATION: 12 weeks  PLANNED INTERVENTIONS: 97110-Therapeutic exercises, 97530- Therapeutic activity, 97112- Neuromuscular re-education, 97535- Self Care, and 21308- Manual therapy  PLAN FOR NEXT SESSION: endurance, dynamic balance program, progress HEP as indicated.    Lenda Kelp, PT 05/23/2023, 11:13 AM   Debara Pickett, SPT  This entire session was performed under direct supervision and direction of a licensed therapist/therapist assistant . I have personally read, edited and approve of the note as written.   Louis Meckel, PT

## 2023-05-24 ENCOUNTER — Ambulatory Visit: Payer: HMO

## 2023-05-25 ENCOUNTER — Ambulatory Visit: Payer: HMO | Admitting: Speech Pathology

## 2023-05-25 DIAGNOSIS — R262 Difficulty in walking, not elsewhere classified: Secondary | ICD-10-CM | POA: Diagnosis not present

## 2023-05-25 DIAGNOSIS — R490 Dysphonia: Secondary | ICD-10-CM

## 2023-05-25 NOTE — Therapy (Unsigned)
OUTPATIENT SPEECH LANGUAGE PATHOLOGY  VOICE TREATMENT NOTE   Patient Name: Jon Bray MRN: [id number] DOB:09/25/1940, 82 y.o., male Today's Date: 05/25/2023  PCP: Bethann Punches, MD REFERRING PROVIDER: Bud Face, MD   End of Session - 05/25/23 1445     Visit Number 2    Number of Visits 17    Date for SLP Re-Evaluation 07/13/23    Authorization Type [employer]    Progress Note Due on Visit 10    SLP Start Time 1445    SLP Stop Time  1530    SLP Time Calculation (min) 45 min    Activity Tolerance Patient tolerated treatment well             Past Medical History:  Diagnosis Date   Anginal pain (HCC)    Aortic atherosclerosis (HCC)    Aortic root enlargement (HCC) 12/27/2017   a.) cCTA 12/27/2017: measured 3.9 cm.   Basal cell carcinoma of skin    a.) s/p Mohs procedure R/L nasal ala 01/27/2015   CAD (coronary artery disease) 05/12/2013   a.) STEMI --> LHC 05/12/2013: 20% p-mLAD, 20% D2, 40% LPDA, 30% LPAV, 99% dLCx --> PCI placing 4.5 x 20 mm and 2.5 x 12 mm Veriflex BMS to LPAV extending to dLCx. b.) cCTA 12/27/2017: Ca score 1578; 82nd percentile for age/sex match control; CT-FFR: RCA 0.99, pLAD 0.97, mLAD 0.98, dLAD 0.89, pLCx 0.98, mLCx 0.97, dLCx 0.97, PLB 0.91, PDA (most distal segment) 0.80.   Cataract    a.) s/p extraction with IOL placement   CKD (chronic kidney disease)    Diverticulosis    GERD (gastroesophageal reflux disease)    History of kidney stones 2023   HLD (hyperlipidemia)    Left lower lobe pulmonary nodule 08/01/2021   a.) CT 08/01/2021: measured 4 mm; perifissural   Long term current use of anticoagulant    a.) apixaban   NSVT (nonsustained ventricular tachycardia) (HCC) 05/13/2013   a.) 24 beat run while in ICU following PCI for STEMI   PAF (paroxysmal atrial fibrillation) (HCC) 11/16/2021   a.) new onset in ED on 11/16/2021. b.) CHA2DS2-VASc = 4 (age x 2, STEMI/aortic plaque, T2DM). c.) rate controlled without  pharmacological intervention; started on apixaban 11/16/2021, however PCP discontinued on 11/18/2021 pending Holter study results.   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   Type 2 diabetes, diet controlled [facility])    Past Surgical History:  Procedure Laterality Date   BIOPSY  12/02/2022   Procedure: BIOPSY;  Surgeon: Meridee Score Netty Starring., MD;  Location: [facility] ENDOSCOPY;  Service: Gastroenterology;;   BIOPSY  01/31/2023   Procedure: BIOPSY;  Surgeon: Imogene Burn, MD;  Location: [facility] ENDOSCOPY;  Service: Gastroenterology;;   CATARACT EXTRACTION W/ INTRAOCULAR LENS IMPLANT Bilateral    CHOLECYSTECTOMY N/A 12/03/2022   Procedure: LAPAROSCOPIC CHOLECYSTECTOMY;  Surgeon: [name], MD;  Location: [facility] OR;  Service: General;  Laterality: N/A;   COLONOSCOPY N/A 01/31/2001   COLONOSCOPY N/A 11/19/2008   CORONARY ANGIOPLASTY WITH STENT PLACEMENT Left 05/12/2013   Procedure: CORONARY ANGIOPLASTY WITH STENT PLACEMENT; Location: [facility]; Surgeon: Lieutenant Diego, MD   ENDOSCOPIC RETROGRADE CHOLANGIOPANCREATOGRAPHY (ERCP) WITH PROPOFOL N/A 11/22/2022   Procedure: ENDOSCOPIC RETROGRADE CHOLANGIOPANCREATOGRAPHY (ERCP) WITH PROPOFOL;  Surgeon: Iva Boop,  MD;  Location: [facility] ENDOSCOPY;  Service: Gastroenterology;  Laterality: N/A;   ERCP N/A 12/02/2022   Procedure: ENDOSCOPIC RETROGRADE CHOLANGIOPANCREATOGRAPHY (ERCP);  Surgeon: [name]., MD;  Location: [facility] ENDOSCOPY;  Service: Gastroenterology;  Laterality: N/A;   ESOPHAGOGASTRODUODENOSCOPY N/A 03/03/2000   ESOPHAGOGASTRODUODENOSCOPY (EGD) WITH PROPOFOL N/A 10/16/2021   Procedure: ESOPHAGOGASTRODUODENOSCOPY (EGD) WITH PROPOFOL;  Surgeon: Regis Bill, MD;  Location: [facility] ENDOSCOPY;  Service: Endoscopy;  Laterality:  N/A;   ESOPHAGOGASTRODUODENOSCOPY (EGD) WITH PROPOFOL N/A 01/31/2023   Procedure: ESOPHAGOGASTRODUODENOSCOPY (EGD) WITH PROPOFOL;  Surgeon: Imogene Burn, MD;  Location: [facility] ENDOSCOPY;  Service: Gastroenterology;  Laterality: N/A;   ESOPHAGOGASTRODUODENOSCOPY (EGD) WITH PROPOFOL N/A 02/16/2023   Procedure: ESOPHAGOGASTRODUODENOSCOPY (EGD) WITH PROPOFOL;  Surgeon: Meridee Score Netty Starring., MD;  Location: [facility] ENDOSCOPY;  Service: Gastroenterology;  Laterality: N/A;   EUS N/A 02/16/2023   Procedure: UPPER ENDOSCOPIC ULTRASOUND (EUS) LINEAR;  Surgeon: [name]., MD;  Location: [facility] ENDOSCOPY;  Service: Gastroenterology;  Laterality: N/A;   EXTRACORPOREAL SHOCK WAVE LITHOTRIPSY Left 08/06/2021   Procedure: EXTRACORPOREAL SHOCK WAVE LITHOTRIPSY (ESWL);  Surgeon: Vanna Scotland, MD;  Location: [facility] ORS;  Service: Urology;  Laterality: Left;   INGUINAL HERNIA REPAIR Right 11/27/2021   Procedure: HERNIA REPAIR INGUINAL ADULT;  Surgeon: Earline Mayotte, MD;  Location: [facility] ORS;  Service: General;  Laterality: Right;   INTRAOCULAR LENS EXCHANGE Left 12/27/2018   Procedure: MECHANICAL VITRECTOMY,  EXCHANGE LENS PROSTHESIS VIA PARS PLANA APPROACH; Location: [facility]; Surgeon: Nicholaus Corolla, MD   MOHS SURGERY Bilateral 01/27/2015   Procedure: MOHS SURGERY TO REMOVE BASAL CELL CARCINOMA FROM RIGHT/LEFT NASAL ALA; Location: [facility]; Surgeon: Katrine Coho, MD   REMOVAL OF STONES  12/02/2022   Procedure: REMOVAL OF STONES;  Surgeon: Meridee Score Netty Starring., MD;  Location: [facility] ENDOSCOPY;  Service: Gastroenterology;;   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   Procedure: RPR RETINAL DTCHMNT W/VITRECTOMY ANY METH REPAIR RETINAL DETACHMENT; WITH VITRECTOMY, INC, WHEN PERFORMED, AIR/GAS TAMPONADE, FOCAL ENDOLASER PHOTOCOAGULATION, CRYOTHERAPY, DRAINAGE OF SUBRETINAL FLUID, SCLERAL BUCKLING AND/OR REMOVAL OF LENS; Location: [facility]; Surgeon: Nicholaus Corolla, MD   SINUSOTOMY N/A    Procedure: MAXILLARY SINUSOTOMY INTRANASAL    SPHINCTEROTOMY  12/02/2022   Procedure: SPHINCTEROTOMY;  Surgeon: Mansouraty, Netty Starring., MD;  Location: 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   Acute recurrent pancreatitis 02/13/2023   Intractable nausea and vomiting 02/13/2023   Displaced nasogastric feeding tube 02/12/2023   Esophageal stenosis 02/12/2023   Chronic pancreatitis (HCC) 02/12/2023   Leukocytosis 02/12/2023   Essential hypertension 02/12/2023   History of CAD (coronary artery disease) 02/12/2023   Atrial fibrillation, chronic -not on coagulation (HCC) 02/12/2023   Chronic disease anemia 02/12/2023   Encounter for feeding tube placement 02/12/2023   Malnutrition of moderate degree 02/01/2023   Nausea and vomiting 01/31/2023   Abnormal CT scan 01/31/2023   Acute pancreatitis 01/28/2023   Chronic diarrhea 12/05/2022   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   Hemorrhagic pancreatitis 11/30/2022   History of ERCP 11/30/2022   Biliary obstruction 11/30/2022   Moderate protein-calorie malnutrition (HCC) 11/30/2022   Gallstone pancreatitis 11/30/2022   Choledocholithiasis 11/21/2022   Acute gallstone pancreatitis 11/17/2022   PAF (paroxysmal atrial fibrillation) (HCC) 11/16/2021   Diabetes type 2, controlled (HCC) 05/15/2018   Hyperlipidemia, mixed 10/28/2015   CAD (coronary artery disease) 05/12/2013   GERD (gastroesophageal reflux disease) 05/12/2013  ONSET DATE:  10 months ago following multiple surgeries   REFERRING DIAG: Paralysis of vocal cords and larynx, 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 Pt with PEG placement for pancreatitis.  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 Gradual improvement over time. CT was WNL. Pt reports had repeat laryngoscopy since my last visit and was told it was stronger. Mirror exam today shows good movement and patient's symptoms are improving. Recommend continue home speech exercises.   PAIN:  Are you having pain? No   FALLS: Has patient fallen in last 6 months? No,   LIVING ENVIRONMENT: Lives with: lives with their family Lives in: House/apartment  PLOF: Independent  PATIENT GOALS    to improve vocal quality  SUBJECTIVE STATEMENT: Pt pleasant, continues to be eager, initial hoarseness observed Pt accompanied by: self  OBJECTIVE:    TODAY'S TREATMENT SESSION:   Skilled treatment session focused on pt's dysphonia goals. SLP facilitated the session by providing the following interventions:  Skilled verbal and written handout provided to pt on "How The Voice Works" with special attention paid to respiratory support. This Clinical research associate will seek clearance prior to initiating EMST d/t pt PEG placement.   He states that he continues to struggle with increasing hydration and protein intake d/t decrease hunger/thirst.   SLP further facilitated session by introducing and instructing pt in utilizing pushing during phonation of "ah" Pt able to increase vocal intensity and also improve vocal quality x 4.    PATIENT REPORTED OUTCOME MEASURES (PROM): To be completed in next 3 sessions    PATIENT EDUCATION: Education details: see above Person educated: Patient Education method: Explanation Education comprehension: needs further education   HOME EXERCISE PROGRAM: Pushing during phonation Increasing hydration    GOALS: Goals reviewed with patient? Yes  SHORT TERM GOALS: Target date: 10 sessions  The patient  will maximize voice quality and loudness using breath support/oral resonance for sustained vowel production, pitch glides, and hierarchal speech drill.  Baseline: Goal status: INITIAL  2.  The patient will demonstrate abdominal breathing patterns and steady release of breath on exhalation to optimize efficiency of voicing and decrease laryngeal hyperfunction.  Baseline:  Goal status: INITIAL  3.  The patient will increase hydration for an eventual goal of 6-8 glasses per day and limit caffeine intake (to maximum of 1-2, 8 oz cups/day), as measured by patient report.  Baseline:  Goal status: INITIAL  LONG TERM GOALS: Target date: 07/13/2023  The patient will participate in 5-8 minutes conversation, maintaining average loudness of 75 dB and loud, good quality voice with min cues.   Baseline:  Goal status: INITIAL  2.  The patient will be independent for abdominal breathing and breath support exercises.  Baseline:  Goal status: INITIAL  3.  Patient will report improved communication effectiveness as measured by PROM Baseline:  Goal status: INITIAL   ASSESSMENT:  CLINICAL IMPRESSION: Patient is a 82 y.o. 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   While pt is able to increase vocal intensity he reports that it is very fatiguing. Will continue  to target respiratory strengthening and building endurance. See the above treatment note for details.   OBJECTIVE IMPAIRMENTS include voice disorder. These impairments are limiting patient from effectively communicating at home and in community. Factors affecting potential to achieve goals and functional outcome are  decreased PO consumption . Patient will benefit from skilled SLP services to address above impairments and improve overall function.  REHAB POTENTIAL: Good  PLAN: SLP  FREQUENCY: 1-2x/week  SLP DURATION: 8 weeks  PLANNED INTERVENTIONS: SLP instruction and feedback and Patient/family education   Prinston Kynard B. Dreama Saa, M.S., CCC-SLP, Tree surgeon Certified Brain Injury Specialist [facility]  [facility] Rehabilitation Services Office [phone] Ascom [phone] Fax [phone]

## 2023-05-26 ENCOUNTER — Encounter: Payer: Self-pay | Admitting: Physical Therapy

## 2023-05-26 ENCOUNTER — Ambulatory Visit: Payer: HMO | Admitting: Physical Therapy

## 2023-05-26 DIAGNOSIS — E782 Mixed hyperlipidemia: Secondary | ICD-10-CM | POA: Diagnosis not present

## 2023-05-26 DIAGNOSIS — M6281 Muscle weakness (generalized): Secondary | ICD-10-CM

## 2023-05-26 DIAGNOSIS — E1151 Type 2 diabetes mellitus with diabetic peripheral angiopathy without gangrene: Secondary | ICD-10-CM | POA: Diagnosis not present

## 2023-05-26 DIAGNOSIS — R262 Difficulty in walking, not elsewhere classified: Secondary | ICD-10-CM

## 2023-05-26 DIAGNOSIS — I2510 Atherosclerotic heart disease of native coronary artery without angina pectoris: Secondary | ICD-10-CM | POA: Diagnosis not present

## 2023-05-26 DIAGNOSIS — R269 Unspecified abnormalities of gait and mobility: Secondary | ICD-10-CM

## 2023-05-26 DIAGNOSIS — Z23 Encounter for immunization: Secondary | ICD-10-CM | POA: Diagnosis not present

## 2023-05-26 DIAGNOSIS — R2689 Other abnormalities of gait and mobility: Secondary | ICD-10-CM

## 2023-05-26 DIAGNOSIS — I482 Chronic atrial fibrillation, unspecified: Secondary | ICD-10-CM | POA: Diagnosis not present

## 2023-05-26 NOTE — Therapy (Signed)
OUTPATIENT PHYSICAL THERAPY NEURO TREATMENT   Patient Name: Jon Bray MRN: [id number] DOB:07/21/1940, 82 y.o., male Today's Date: 05/26/2023   PCP: Danella Penton, MD REFERRING PROVIDER: Danella Penton, MD  END OF SESSION:  PT End of Session - 05/26/23 1615     Visit Number 5    Number of Visits 24    Date for PT Re-Evaluation 08/02/23    Progress Note Due on Visit 10    PT Start Time 1616    PT Stop Time 1657    PT Time Calculation (min) 41 min    Equipment Utilized During Treatment Gait belt    Activity Tolerance Patient tolerated treatment well    Behavior During Therapy WFL for tasks assessed/performed             Past Medical History:  Diagnosis Date   Anginal pain (HCC)    Aortic atherosclerosis (HCC)    Aortic root enlargement (HCC) 12/27/2017   a.) cCTA 12/27/2017: measured 3.9 cm.   Basal cell carcinoma of skin    a.) s/p Mohs procedure R/L nasal ala 01/27/2015   CAD (coronary artery disease) 05/12/2013   a.) STEMI --> LHC 05/12/2013: 20% p-mLAD, 20% D2, 40% LPDA, 30% LPAV, 99% dLCx --> PCI placing 4.5 x 20 mm and 2.5 x 12 mm Veriflex BMS to LPAV extending to dLCx. b.) cCTA 12/27/2017: Ca score 1578; 82nd percentile for age/sex match control; CT-FFR: RCA 0.99, pLAD 0.97, mLAD 0.98, dLAD 0.89, pLCx 0.98, mLCx 0.97, dLCx 0.97, PLB 0.91, PDA (most distal segment) 0.80.   Cataract    a.) s/p extraction with IOL placement   CKD (chronic kidney disease)    Diverticulosis    GERD (gastroesophageal reflux disease)    History of kidney stones 2023   HLD (hyperlipidemia)    Left lower lobe pulmonary nodule 08/01/2021   a.) CT 08/01/2021: measured 4 mm; perifissural   Long term current use of anticoagulant    a.) apixaban   NSVT (nonsustained ventricular tachycardia) (HCC) 05/13/2013   a.) 24 beat run while in ICU following PCI for STEMI   PAF (paroxysmal atrial fibrillation) (HCC) 11/16/2021   a.) new onset in ED on 11/16/2021. b.) CHA2DS2-VASc = 4 (age x 2,  STEMI/aortic plaque, T2DM). c.) rate controlled without pharmacological intervention; started on apixaban 11/16/2021, however PCP discontinued on 11/18/2021 pending Holter study results.   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   Type 2 diabetes, diet controlled [facility])    Past Surgical History:  Procedure Laterality Date   BIOPSY  12/02/2022   Procedure: BIOPSY;  Surgeon: Meridee Score Netty Starring., MD;  Location: [facility] ENDOSCOPY;  Service: Gastroenterology;;   BIOPSY  01/31/2023   Procedure: BIOPSY;  Surgeon: Imogene Burn, MD;  Location: [facility] ENDOSCOPY;  Service: Gastroenterology;;   CATARACT EXTRACTION W/ INTRAOCULAR LENS IMPLANT Bilateral    CHOLECYSTECTOMY N/A 12/03/2022   Procedure: LAPAROSCOPIC CHOLECYSTECTOMY;  Surgeon: [name], MD;  Location: [facility] OR;  Service: General;  Laterality: N/A;   COLONOSCOPY N/A 01/31/2001   COLONOSCOPY N/A 11/19/2008   CORONARY ANGIOPLASTY WITH STENT PLACEMENT Left 05/12/2013   Procedure: CORONARY ANGIOPLASTY WITH STENT PLACEMENT; Location: [facility]; Surgeon: Lieutenant Diego, MD   ENDOSCOPIC RETROGRADE CHOLANGIOPANCREATOGRAPHY (ERCP) WITH PROPOFOL N/A  11/22/2022   Procedure: ENDOSCOPIC RETROGRADE CHOLANGIOPANCREATOGRAPHY (ERCP) WITH PROPOFOL;  Surgeon: Iva Boop, MD;  Location: [facility] ENDOSCOPY;  Service: Gastroenterology;  Laterality: N/A;   ERCP N/A 12/02/2022   Procedure: ENDOSCOPIC RETROGRADE CHOLANGIOPANCREATOGRAPHY (ERCP);  Surgeon: [name]., MD;  Location: [facility] ENDOSCOPY;  Service: Gastroenterology;  Laterality: N/A;   ESOPHAGOGASTRODUODENOSCOPY N/A 03/03/2000   ESOPHAGOGASTRODUODENOSCOPY (EGD) WITH PROPOFOL N/A 10/16/2021   Procedure: ESOPHAGOGASTRODUODENOSCOPY (EGD) WITH PROPOFOL;  Surgeon: Regis Bill, MD;   Location: [facility] ENDOSCOPY;  Service: Endoscopy;  Laterality: N/A;   ESOPHAGOGASTRODUODENOSCOPY (EGD) WITH PROPOFOL N/A 01/31/2023   Procedure: ESOPHAGOGASTRODUODENOSCOPY (EGD) WITH PROPOFOL;  Surgeon: Imogene Burn, MD;  Location: [facility] ENDOSCOPY;  Service: Gastroenterology;  Laterality: N/A;   ESOPHAGOGASTRODUODENOSCOPY (EGD) WITH PROPOFOL N/A 02/16/2023   Procedure: ESOPHAGOGASTRODUODENOSCOPY (EGD) WITH PROPOFOL;  Surgeon: Meridee Score Netty Starring., MD;  Location: [facility] ENDOSCOPY;  Service: Gastroenterology;  Laterality: N/A;   EUS N/A 02/16/2023   Procedure: UPPER ENDOSCOPIC ULTRASOUND (EUS) LINEAR;  Surgeon: [name]., MD;  Location: [facility] ENDOSCOPY;  Service: Gastroenterology;  Laterality: N/A;   EXTRACORPOREAL SHOCK WAVE LITHOTRIPSY Left 08/06/2021   Procedure: EXTRACORPOREAL SHOCK WAVE LITHOTRIPSY (ESWL);  Surgeon: Vanna Scotland, MD;  Location: [facility] ORS;  Service: Urology;  Laterality: Left;   INGUINAL HERNIA REPAIR Right 11/27/2021   Procedure: HERNIA REPAIR INGUINAL ADULT;  Surgeon: Earline Mayotte, MD;  Location: [facility] ORS;  Service: General;  Laterality: Right;   INTRAOCULAR LENS EXCHANGE Left 12/27/2018   Procedure: MECHANICAL VITRECTOMY,  EXCHANGE LENS PROSTHESIS VIA PARS PLANA APPROACH; Location: [facility]; Surgeon: Nicholaus Corolla, MD   MOHS SURGERY Bilateral 01/27/2015   Procedure: MOHS SURGERY TO REMOVE BASAL CELL CARCINOMA FROM RIGHT/LEFT NASAL ALA; Location: [facility]; Surgeon: Katrine Coho, MD   REMOVAL OF STONES  12/02/2022   Procedure: REMOVAL OF STONES;  Surgeon: Meridee Score Netty Starring., MD;  Location: [facility] ENDOSCOPY;  Service: Gastroenterology;;   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   Procedure: RPR RETINAL DTCHMNT W/VITRECTOMY ANY METH REPAIR RETINAL DETACHMENT; WITH VITRECTOMY, INC, WHEN PERFORMED, AIR/GAS TAMPONADE, FOCAL ENDOLASER PHOTOCOAGULATION, CRYOTHERAPY, DRAINAGE OF SUBRETINAL FLUID, SCLERAL BUCKLING AND/OR REMOVAL OF LENS; Location: [facility]; Surgeon: Nicholaus Corolla, MD    SINUSOTOMY N/A    Procedure: MAXILLARY SINUSOTOMY INTRANASAL   SPHINCTEROTOMY  12/02/2022   Procedure: SPHINCTEROTOMY;  Surgeon: Mansouraty, Netty Starring., MD;  Location: [facility] ENDOSCOPY;  Service: Gastroenterology;;   TONSILLECTOMY AND ADENOIDECTOMY N/A    Patient Active Problem List   Diagnosis Date Noted   Hypokalemia 02/16/2023   Gastric outflow obstruction 02/14/2023   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   Acute recurrent pancreatitis 02/13/2023   Intractable nausea and vomiting 02/13/2023   Displaced nasogastric feeding tube 02/12/2023   Esophageal stenosis 02/12/2023   Chronic pancreatitis (HCC) 02/12/2023   Leukocytosis 02/12/2023   Essential hypertension 02/12/2023   History of CAD (coronary artery disease) 02/12/2023   Atrial fibrillation, chronic -not on coagulation (HCC) 02/12/2023   Chronic disease anemia 02/12/2023   Encounter for feeding tube placement 02/12/2023   Malnutrition of moderate degree 02/01/2023   Nausea and vomiting 01/31/2023   Abnormal CT scan 01/31/2023   Acute pancreatitis 01/28/2023   Chronic diarrhea 12/05/2022   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   Hemorrhagic pancreatitis 11/30/2022   History of ERCP 11/30/2022   Biliary obstruction 11/30/2022   Moderate protein-calorie malnutrition (HCC) 11/30/2022   Gallstone pancreatitis 11/30/2022   Choledocholithiasis 11/21/2022   Acute gallstone pancreatitis 11/17/2022   PAF (paroxysmal atrial fibrillation) (HCC) 11/16/2021   Diabetes type 2, controlled (HCC) 05/15/2018   Hyperlipidemia, mixed 10/28/2015  CAD (coronary artery disease) 05/12/2013   GERD (gastroesophageal reflux disease) 05/12/2013    ONSET DATE: 11/03/2022  REFERRING DIAG: R53.1 (ICD-10-CM) - Weakness   THERAPY DIAG:  Abnormality of gait and mobility  Muscle weakness (generalized)  Difficulty in walking, not elsewhere classified  Other abnormalities of gait and mobility  Rationale for Evaluation and Treatment:  Rehabilitation  SUBJECTIVE:                                                                                                                                                                                             SUBJECTIVE STATEMENT: Pt stated that he went to cardiologist appointment earlier today and stated that everything was normal. Pt reported 0/10 on NPS and no falls since last visit.    Pt accompanied by: self  PERTINENT HISTORY: Pancreatitis, CAD, GERD, Hyperlipidemia, Diabetes Type 2, Paroxysmal Atrial Fibrillation, Malnutrition, Hypertension From initial eval: Pt stated that he was diagnosed with pancreatitis in May of 2024 and has been in and out of the hospital ever since. Pt stated that he lost 35 lbs in 2 weeks between May & June of 2024 and has had difficulty gaining weight since then. Pt stated that he has been utilizing a feeding tube for the past 45 days. Ever since diagnosis of pancreatitis, pt feels that he has lost muscle tone, muscle strength, and endurance. Pt stated that he goes to the gym 2 days a week. Pt has had PT at [facility] rehab, as well as home health and was told he needed PT that was more advanced. Pt feels like balance has been regressing ever since hospitalization in May. 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      PAIN:  Are you having pain? No  PRECAUTIONS: None  RED FLAGS: None   WEIGHT BEARING RESTRICTIONS: No  FALLS: Has patient fallen in last 6 months? No  LIVING ENVIRONMENT: Lives with: lives with their family and lives with their spouse Lives in: House/apartment Stairs: Yes: External: 2 steps; on right going up Has following equipment at home: Single point cane  PLOF: Independent  PATIENT GOALS: Increase muscle strength, endurance, and get back to being able to [place] drive [place]   OBJECTIVE:  Note: Objective measures were completed at Evaluation unless otherwise noted.  DIAGNOSTIC  FINDINGS: N/A  COGNITION: Overall cognitive status: Within functional limits for tasks assessed   SENSATION: Not tested  COORDINATION: WFL  EDEMA:  Not tested   MUSCLE TONE: Not tested  MUSCLE LENGTH: Not tested   DTRs:  Not tested  POSTURE: No Significant  postural limitations  LOWER EXTREMITY ROM:     Active  Right Eval Left Eval  Hip flexion    Hip extension    Hip abduction    Hip adduction    Hip internal rotation    Hip external rotation    Knee flexion    Knee extension    Ankle dorsiflexion    Ankle plantarflexion    Ankle inversion    Ankle eversion     (Blank rows = not tested)  LOWER EXTREMITY MMT:    MMT Right Eval Left Eval  Hip flexion 4 4-  Hip extension    Hip abduction    Hip adduction    Hip internal rotation    Hip external rotation    Knee flexion    Knee extension 4 4  Ankle dorsiflexion 4 4-  Ankle plantarflexion    Ankle inversion    Ankle eversion    (Blank rows = not tested)  BED MOBILITY:  WFL  TRANSFERS: Assistive device utilized: N/A  Sit to stand: Complete Independence Stand to sit: Complete Independence Chair to chair: Complete Independence Floor:  Not tested  RAMP:  Level of Assistance:  Not tested  Assistive device utilized: N/A  CURB:  Level of Assistance:  Not tested Assistive device utilized: N/A  STAIRS: Level of Assistance:  Not tested  GAIT: Gait pattern: WFL Distance walked: 20 meters Assistive device utilized:  None Level of assistance: SBA  FUNCTIONAL TESTS:  5 times sit to stand: 10 seconds 10 meter walk test: 6.16 seconds  PATIENT SURVEYS:  FOTO 56  TODAY'S TREATMENT:                                                                                                                              DATE: 05/23/2023  TE   Sit to stand without UE support 2x10 Standing calf stretch on slant board without UE support 1 minute Forward Lunges without UE support 2x10 reps each LE Standing  Hip Abductions 2.5# AW 2x10 without UE support Standing Hip Extensions 2.5# AW 2x10 without UE support   NMR:  Step taps onto 6" step x 10 reps alt LE without UE support Step ups on 6" step  x 10 reps alt LE without UE support Standing staggered (front leg on 6" step and back LE on airex) 3x30 sec each LE Static tandem standing on airex beam 3x30 sec each LE Tandem walking on airex beam without UE support 10 reps      PATIENT EDUCATION: Education details: POC Person educated: Patient Education method: Explanation Education comprehension: verbalized understanding   HOME EXERCISE PROGRAM: Access Code: [email or web address] URL: [email or web address] Date: 05/13/2023 Prepared by: Thresa Ross  Exercises - Standing Single Leg Stance with Counter Support  - 1 x daily - 7 x weekly - 3 sets - 30 sec  hold - Standing Heel Raise  - 1 x daily - 7 x weekly - 2 sets - 10  reps - 3 second hold  HOME EXERCISE PROGRAM: To be administered next visit  GOALS: Goals reviewed with patient? No  SHORT TERM GOALS: Target date: 06/07/2023    Patient will be independent in home exercise program to improve strength/mobility for better functional independence with ADLs. Baseline: No HEP currently  Goal status: INITIAL    LONG TERM GOALS: Target date: 08/02/2023    Pt will increase FOTO score to at least 65 in order to demonstrate increased independence with ADLs.  Baseline: 05/10/23: 56 Goal status: INITIAL  2.  Pt will decrease 5xSTS by 2 seconds in order to demonstrate increased functional mobility strength in lower extremities.  Baseline: 05/10/23: 10 seconds  Goal status: INITIAL  3.  Pt will improve 200 feet or more during the in order to demonstrate increased endurance to perform IADLs.  Baseline: 11/8: 1070 ft  Goal status: INITIAL  4.  Pt will increase Mini Best Balance test score by 4 points in order to demonstrate increased balance, so that the patient can safely play  a full game of golf.  Baseline: 11/8:22 Goal status: INITIAL      ASSESSMENT:  CLINICAL IMPRESSION:  Pt responded well to treatment today. Pt tolerated all tasks during today's visit. Pt was able to increase AW in hip abductions/extensions and rep count in forward lunges and staggered standing tasks. Pt reported that he feels that balance is improving as a result of PT. Pt stated that his SLS time is improving as a result of practicing it as a part of his HEP. Pt will continue to benefit from skilled physical therapy intervention to address impairments, improve QOL, and attain therapy goals.    OBJECTIVE IMPAIRMENTS: decreased endurance.   ACTIVITY LIMITATIONS:  N/A  PARTICIPATION LIMITATIONS:  N/A  PERSONAL FACTORS: 1 comorbidity: pancreatitis  are also affecting patient's functional outcome.   REHAB POTENTIAL: Good  CLINICAL DECISION MAKING: Evolving/moderate complexity  EVALUATION COMPLEXITY: Low  PLAN:  PT FREQUENCY: 2x/week  PT DURATION: 12 weeks  PLANNED INTERVENTIONS: 97110-Therapeutic exercises, 97530- Therapeutic activity, 97112- Neuromuscular re-education, 97535- Self Care, and [id number]- Manual therapy  PLAN FOR NEXT SESSION: endurance, dynamic balance program, progress HEP as indicated.    Debara Pickett, Student-PT 05/26/2023, 5:08 PM

## 2023-05-27 ENCOUNTER — Ambulatory Visit: Payer: HMO

## 2023-05-30 ENCOUNTER — Ambulatory Visit: Payer: HMO | Admitting: Speech Pathology

## 2023-05-30 ENCOUNTER — Ambulatory Visit: Payer: HMO

## 2023-05-30 ENCOUNTER — Encounter: Payer: Self-pay | Admitting: Physical Therapy

## 2023-05-30 DIAGNOSIS — R262 Difficulty in walking, not elsewhere classified: Secondary | ICD-10-CM | POA: Diagnosis not present

## 2023-05-30 DIAGNOSIS — R269 Unspecified abnormalities of gait and mobility: Secondary | ICD-10-CM

## 2023-05-30 DIAGNOSIS — R2689 Other abnormalities of gait and mobility: Secondary | ICD-10-CM

## 2023-05-30 DIAGNOSIS — M6281 Muscle weakness (generalized): Secondary | ICD-10-CM

## 2023-05-30 DIAGNOSIS — R490 Dysphonia: Secondary | ICD-10-CM

## 2023-05-30 NOTE — Therapy (Signed)
OUTPATIENT PHYSICAL THERAPY TREATMENT   Patient Name: Jon Bray MRN: [id number] DOB:Sep 15, 1940, 82 y.o., male Today's Date: 05/30/2023   PCP: Danella Penton, MD REFERRING PROVIDER: Danella Penton, MD  END OF SESSION:  PT End of Session - 05/30/23 0932     Visit Number 6    Number of Visits 24    Date for PT Re-Evaluation 08/02/23    Progress Note Due on Visit 10    PT Start Time 0932    PT Stop Time 1014    PT Time Calculation (min) 42 min    Equipment Utilized During Treatment Gait belt    Activity Tolerance Patient tolerated treatment well    Behavior During Therapy WFL for tasks assessed/performed             Past Medical History:  Diagnosis Date   Anginal pain (HCC)    Aortic atherosclerosis (HCC)    Aortic root enlargement (HCC) 12/27/2017   a.) cCTA 12/27/2017: measured 3.9 cm.   Basal cell carcinoma of skin    a.) s/p Mohs procedure R/L nasal ala 01/27/2015   CAD (coronary artery disease) 05/12/2013   a.) STEMI --> LHC 05/12/2013: 20% p-mLAD, 20% D2, 40% LPDA, 30% LPAV, 99% dLCx --> PCI placing 4.5 x 20 mm and 2.5 x 12 mm Veriflex BMS to LPAV extending to dLCx. b.) cCTA 12/27/2017: Ca score 1578; 82nd percentile for age/sex match control; CT-FFR: RCA 0.99, pLAD 0.97, mLAD 0.98, dLAD 0.89, pLCx 0.98, mLCx 0.97, dLCx 0.97, PLB 0.91, PDA (most distal segment) 0.80.   Cataract    a.) s/p extraction with IOL placement   CKD (chronic kidney disease)    Diverticulosis    GERD (gastroesophageal reflux disease)    History of kidney stones 2023   HLD (hyperlipidemia)    Left lower lobe pulmonary nodule 08/01/2021   a.) CT 08/01/2021: measured 4 mm; perifissural   Long term current use of anticoagulant    a.) apixaban   NSVT (nonsustained ventricular tachycardia) (HCC) 05/13/2013   a.) 24 beat run while in ICU following PCI for STEMI   PAF (paroxysmal atrial fibrillation) (HCC) 11/16/2021   a.) new onset in ED on 11/16/2021. b.) CHA2DS2-VASc = 4 (age x 2,  STEMI/aortic plaque, T2DM). c.) rate controlled without pharmacological intervention; started on apixaban 11/16/2021, however PCP discontinued on 11/18/2021 pending Holter study results.   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   Type 2 diabetes, diet controlled [facility])    Past Surgical History:  Procedure Laterality Date   BIOPSY  12/02/2022   Procedure: BIOPSY;  Surgeon: Meridee Score Netty Starring., MD;  Location: [facility] ENDOSCOPY;  Service: Gastroenterology;;   BIOPSY  01/31/2023   Procedure: BIOPSY;  Surgeon: Imogene Burn, MD;  Location: [facility] ENDOSCOPY;  Service: Gastroenterology;;   CATARACT EXTRACTION W/ INTRAOCULAR LENS IMPLANT Bilateral    CHOLECYSTECTOMY N/A 12/03/2022   Procedure: LAPAROSCOPIC CHOLECYSTECTOMY;  Surgeon: [name], MD;  Location: [facility] OR;  Service: General;  Laterality: N/A;   COLONOSCOPY N/A 01/31/2001   COLONOSCOPY N/A 11/19/2008   CORONARY ANGIOPLASTY WITH STENT PLACEMENT Left 05/12/2013   Procedure: CORONARY ANGIOPLASTY WITH STENT PLACEMENT; Location: [facility]; Surgeon: Lieutenant Diego, MD   ENDOSCOPIC RETROGRADE CHOLANGIOPANCREATOGRAPHY (ERCP) WITH PROPOFOL N/A 11/22/2022  Procedure: ENDOSCOPIC RETROGRADE CHOLANGIOPANCREATOGRAPHY (ERCP) WITH PROPOFOL;  Surgeon: Iva Boop, MD;  Location[facility] ENDOSCOPY;  Service: Gastroenterology;  Laterality: N/A;   ERCP N/A 12/02/2022   Procedure: ENDOSCOPIC RETROGRADE CHOLANGIOPANCREATOGRAPHY (ERCP);  Surgeon: [name]., MD;  Location: [facility] ENDOSCOPY;  Service: Gastroenterology;  Laterality: N/A;   ESOPHAGOGASTRODUODENOSCOPY N/A 03/03/2000   ESOPHAGOGASTRODUODENOSCOPY (EGD) WITH PROPOFOL N/A 10/16/2021   Procedure: ESOPHAGOGASTRODUODENOSCOPY (EGD) WITH PROPOFOL;  Surgeon: Regis Bill, MD;   Location: [facility] ENDOSCOPY;  Service: Endoscopy;  Laterality: N/A;   ESOPHAGOGASTRODUODENOSCOPY (EGD) WITH PROPOFOL N/A 01/31/2023   Procedure: ESOPHAGOGASTRODUODENOSCOPY (EGD) WITH PROPOFOL;  Surgeon: Imogene Burn, MD;  Location: [facility] ENDOSCOPY;  Service: Gastroenterology;  Laterality: N/A;   ESOPHAGOGASTRODUODENOSCOPY (EGD) WITH PROPOFOL N/A 02/16/2023   Procedure: ESOPHAGOGASTRODUODENOSCOPY (EGD) WITH PROPOFOL;  Surgeon: Meridee Score Netty Starring., MD;  Location: [facility] ENDOSCOPY;  Service: Gastroenterology;  Laterality: N/A;   EUS N/A 02/16/2023   Procedure: UPPER ENDOSCOPIC ULTRASOUND (EUS) LINEAR;  Surgeon: [name]., MD;  Location: [facility] ENDOSCOPY;  Service: Gastroenterology;  Laterality: N/A;   EXTRACORPOREAL SHOCK WAVE LITHOTRIPSY Left 08/06/2021   Procedure: EXTRACORPOREAL SHOCK WAVE LITHOTRIPSY (ESWL);  Surgeon: Vanna Scotland, MD;  Location: [facility] ORS;  Service: Urology;  Laterality: Left;   INGUINAL HERNIA REPAIR Right 11/27/2021   Procedure: HERNIA REPAIR INGUINAL ADULT;  Surgeon: Earline Mayotte, MD;  Location: [facility] ORS;  Service: General;  Laterality: Right;   INTRAOCULAR LENS EXCHANGE Left 12/27/2018   Procedure: MECHANICAL VITRECTOMY,  EXCHANGE LENS PROSTHESIS VIA PARS PLANA APPROACH; Location: [facility]; Surgeon: Nicholaus Corolla, MD   MOHS SURGERY Bilateral 01/27/2015   Procedure: MOHS SURGERY TO REMOVE BASAL CELL CARCINOMA FROM RIGHT/LEFT NASAL ALA; Location: [facility]; Surgeon: Katrine Coho, MD   REMOVAL OF STONES  12/02/2022   Procedure: REMOVAL OF STONES;  Surgeon: Meridee Score Netty Starring., MD;  Location: [facility] ENDOSCOPY;  Service: Gastroenterology;;   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   Procedure: RPR RETINAL DTCHMNT W/VITRECTOMY ANY METH REPAIR RETINAL DETACHMENT; WITH VITRECTOMY, INC, WHEN PERFORMED, AIR/GAS TAMPONADE, FOCAL ENDOLASER PHOTOCOAGULATION, CRYOTHERAPY, DRAINAGE OF SUBRETINAL FLUID, SCLERAL BUCKLING AND/OR REMOVAL OF LENS; Location: [facility]; Surgeon: Nicholaus Corolla, MD    SINUSOTOMY N/A    Procedure: MAXILLARY SINUSOTOMY INTRANASAL   SPHINCTEROTOMY  12/02/2022   Procedure: SPHINCTEROTOMY;  Surgeon: Mansouraty, Netty Starring., MD;  Location: [facility] ENDOSCOPY;  Service: Gastroenterology;;   TONSILLECTOMY AND ADENOIDECTOMY N/A    Patient Active Problem List   Diagnosis Date Noted   Hypokalemia 02/16/2023   Gastric outflow obstruction 02/14/2023   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   Acute recurrent pancreatitis 02/13/2023   Intractable nausea and vomiting 02/13/2023   Displaced nasogastric feeding tube 02/12/2023   Esophageal stenosis 02/12/2023   Chronic pancreatitis (HCC) 02/12/2023   Leukocytosis 02/12/2023   Essential hypertension 02/12/2023   History of CAD (coronary artery disease) 02/12/2023   Atrial fibrillation, chronic -not on coagulation (HCC) 02/12/2023   Chronic disease anemia 02/12/2023   Encounter for feeding tube placement 02/12/2023   Malnutrition of moderate degree 02/01/2023   Nausea and vomiting 01/31/2023   Abnormal CT scan 01/31/2023   Acute pancreatitis 01/28/2023   Chronic diarrhea 12/05/2022   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   Hemorrhagic pancreatitis 11/30/2022   History of ERCP 11/30/2022   Biliary obstruction 11/30/2022   Moderate protein-calorie malnutrition (HCC) 11/30/2022   Gallstone pancreatitis 11/30/2022   Choledocholithiasis 11/21/2022   Acute gallstone pancreatitis 11/17/2022   PAF (paroxysmal atrial fibrillation) (HCC) 11/16/2021   Diabetes type 2, controlled (HCC) 05/15/2018   Hyperlipidemia, mixed 10/28/2015   CAD (  coronary artery disease) 05/12/2013   GERD (gastroesophageal reflux disease) 05/12/2013    ONSET DATE: 11/03/2022  REFERRING DIAG: R53.1 (ICD-10-CM) - Weakness   THERAPY DIAG:  Abnormality of gait and mobility  Muscle weakness (generalized)  Other abnormalities of gait and mobility  Difficulty in walking, not elsewhere classified  Rationale for Evaluation and Treatment:  Rehabilitation  SUBJECTIVE:                                                                                                                                                                                             SUBJECTIVE STATEMENT: Pt reported no changes or falls since last visit. Pt reported 0/10 on NPS.    Pt accompanied by: self  PERTINENT HISTORY:  Pt stated that he was diagnosed with pancreatitis in May of 2024 and has been in and out of the hospital ever since. Pt stated that he lost 35 lbs in 2 weeks between May & June of 2024 and has had difficulty gaining weight since then. Pt stated that he has been utilizing a feeding tube for the past 45 days. Ever since diagnosis of pancreatitis, pt feels that he has lost muscle tone, muscle strength, and endurance. Pt stated that he goes to the gym 2 days a week. Pt has had PT at [facility] rehab, as well as home health and was told he needed PT that was more advanced. Pt feels like balance has been regressing ever since hospitalization in May. 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 PMH: Pancreatitis, CAD, GERD, Hyperlipidemia, Diabetes Type 2, Paroxysmal Atrial Fibrillation, Malnutrition, Hypertension     PAIN:  Are you having pain? No   PRECAUTIONS: None  RED FLAGS: None   WEIGHT BEARING RESTRICTIONS: No  FALLS: Has patient fallen in last 6 months? No  LIVING ENVIRONMENT: Lives with: lives with their family and lives with their spouse Lives in: House/apartment Stairs: Yes: External: 2 steps; on right going up Has following equipment at home: Single point cane  PLOF: Independent  PATIENT GOALS: Increase muscle strength, endurance, and get back to being able to [place] drive [place]   OBJECTIVE:  Note: Objective measures were completed at Evaluation unless otherwise noted.   LOWER EXTREMITY MMT:    MMT Right Eval Left Eval  Hip flexion 4 4-  Knee extension 4 4  Ankle  dorsiflexion 4 4-  (Blank rows = not tested)  BED MOBILITY:  WFL  FUNCTIONAL TESTS:  5 times sit to stand: 10 seconds 10 meter walk test: 6.16 seconds  PATIENT SURVEYS:  FOTO 56  TODAY'S  TREATMENT:                                                                                                                              DATE:  05/30/23   TE   Sit to stand without UE support 2x10 Standing calf stretch on slant board without UE support 1 minute Forward Lunges without UE support 2x10 reps each LE Standing Hip Abductions 2.5# AW 2x10 without UE support Standing Hip Extensions 2.5# AW 2x10 without UE support   NMR:  Step ups on 6" step  x 10 reps alt LE without UE support Standing staggered (front leg on 6" step and back LE on airex) 3x30 sec each LE Static tandem standing on airex beam 3x30 sec each LE Tandem walking on airex beam without UE support 11 reps      PATIENT EDUCATION: Education details: POC Person educated: Patient Education method: Explanation Education comprehension: verbalized understanding   HOME EXERCISE PROGRAM: Access Code: [email or web address] URL: [email or web address] Date: 05/13/2023 Prepared by: Thresa Ross  Exercises - Standing Single Leg Stance with Counter Support  - 1 x daily - 7 x weekly - 3 sets - 30 sec  hold - Standing Heel Raise  - 1 x daily - 7 x weekly - 2 sets - 10 reps - 3 second hold  HOME EXERCISE PROGRAM: To be administered next visit  GOALS: Goals reviewed with patient? No  SHORT TERM GOALS: Target date: 06/07/2023    Patient will be independent in home exercise program to improve strength/mobility for better functional independence with ADLs. Baseline: No HEP currently  Goal status: INITIAL    LONG TERM GOALS: Target date: 08/02/2023    Pt will increase FOTO score to at least 65 in order to demonstrate increased independence with ADLs.  Baseline: 05/10/23: 56 Goal status: INITIAL  2.  Pt will  decrease 5xSTS by 2 seconds in order to demonstrate increased functional mobility strength in lower extremities.  Baseline: 05/10/23: 10 seconds  Goal status: INITIAL  3.  Pt will improve 200 feet or more during the in order to demonstrate increased endurance to perform IADLs.  Baseline: 11/8: 1070 ft  Goal status: INITIAL  4.  Pt will increase Mini Best Balance test score by 4 points in order to demonstrate increased balance, so that the patient can safely play a full game of golf.  Baseline: 11/8:22 Goal status: INITIAL      ASSESSMENT:  CLINICAL IMPRESSION:  Pt responded well to treatment today. Pt tolerated all tasks during today's visit. Pt reported that he feels that balance is improving as a result of PT. Pt stated that his SLS time is improving as a result of practicing it as a part of his HEP. Pt reported that he is going to try to go play golf this afternoon and see how his balance is with putting. Pt will continue to benefit from skilled physical therapy intervention to address impairments, improve QOL,  and attain therapy goals.    OBJECTIVE IMPAIRMENTS: decreased endurance.   ACTIVITY LIMITATIONS:  N/A  PARTICIPATION LIMITATIONS:  N/A  PERSONAL FACTORS: 1 comorbidity: pancreatitis  are also affecting patient's functional outcome.   REHAB POTENTIAL: Good  CLINICAL DECISION MAKING: Evolving/moderate complexity  EVALUATION COMPLEXITY: Low  PLAN:  PT FREQUENCY: 2x/week  PT DURATION: 12 weeks  PLANNED INTERVENTIONS: 97110-Therapeutic exercises, 97530- Therapeutic activity, 97112- Neuromuscular re-education, 97535- Self Care, and [id number]- Manual therapy  PLAN FOR NEXT SESSION: endurance, dynamic balance program, progress HEP as indicated.    Debara Pickett, Student-PT 05/30/2023, 10:31 AM    This entire session was performed under the direct supervision of a licensed physical therapist. I have reviewed and agree with student's findings and recommendations.    2:48 PM, 05/30/23 Rosamaria Lints, PT, DPT Physical Therapist - [facility]  Outpatient Physical Therapy- Main Campus [phone]

## 2023-05-30 NOTE — Therapy (Signed)
OUTPATIENT SPEECH LANGUAGE PATHOLOGY  VOICE TREATMENT NOTE   Patient Name: Jon Bray MRN: [id number] DOB:01-10-1941, 82 y.o., male Today's Date: 05/30/2023  PCP: Bethann Punches, MD REFERRING PROVIDER: Bud Face, MD   End of Session - 05/30/23 0846     Visit Number 3    Number of Visits 17    Date for SLP Re-Evaluation 07/13/23    Authorization Type [employer]    Progress Note Due on Visit 10    SLP Start Time 0845    SLP Stop Time  0930    SLP Time Calculation (min) 45 min    Activity Tolerance Patient tolerated treatment well             Past Medical History:  Diagnosis Date   Anginal pain (HCC)    Aortic atherosclerosis (HCC)    Aortic root enlargement (HCC) 12/27/2017   a.) cCTA 12/27/2017: measured 3.9 cm.   Basal cell carcinoma of skin    a.) s/p Mohs procedure R/L nasal ala 01/27/2015   CAD (coronary artery disease) 05/12/2013   a.) STEMI --> LHC 05/12/2013: 20% p-mLAD, 20% D2, 40% LPDA, 30% LPAV, 99% dLCx --> PCI placing 4.5 x 20 mm and 2.5 x 12 mm Veriflex BMS to LPAV extending to dLCx. b.) cCTA 12/27/2017: Ca score 1578; 82nd percentile for age/sex match control; CT-FFR: RCA 0.99, pLAD 0.97, mLAD 0.98, dLAD 0.89, pLCx 0.98, mLCx 0.97, dLCx 0.97, PLB 0.91, PDA (most distal segment) 0.80.   Cataract    a.) s/p extraction with IOL placement   CKD (chronic kidney disease)    Diverticulosis    GERD (gastroesophageal reflux disease)    History of kidney stones 2023   HLD (hyperlipidemia)    Left lower lobe pulmonary nodule 08/01/2021   a.) CT 08/01/2021: measured 4 mm; perifissural   Long term current use of anticoagulant    a.) apixaban   NSVT (nonsustained ventricular tachycardia) (HCC) 05/13/2013   a.) 24 beat run while in ICU following PCI for STEMI   PAF (paroxysmal atrial fibrillation) (HCC) 11/16/2021   a.) new onset in ED on 11/16/2021. b.) CHA2DS2-VASc = 4 (age x 2, STEMI/aortic plaque, T2DM). c.) rate controlled without  pharmacological intervention; started on apixaban 11/16/2021, however PCP discontinued on 11/18/2021 pending Holter study results.   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   Type 2 diabetes, diet controlled [facility])    Past Surgical History:  Procedure Laterality Date   BIOPSY  12/02/2022   Procedure: BIOPSY;  Surgeon: Meridee Score Netty Starring., MD;  Location: [facility] ENDOSCOPY;  Service: Gastroenterology;;   BIOPSY  01/31/2023   Procedure: BIOPSY;  Surgeon: Imogene Burn, MD;  Location: [facility] ENDOSCOPY;  Service: Gastroenterology;;   CATARACT EXTRACTION W/ INTRAOCULAR LENS IMPLANT Bilateral    CHOLECYSTECTOMY N/A 12/03/2022   Procedure: LAPAROSCOPIC CHOLECYSTECTOMY;  Surgeon: [name], MD;  Location: [facility] OR;  Service: General;  Laterality: N/A;   COLONOSCOPY N/A 01/31/2001   COLONOSCOPY N/A 11/19/2008   CORONARY ANGIOPLASTY WITH STENT PLACEMENT Left 05/12/2013   Procedure: CORONARY ANGIOPLASTY WITH STENT PLACEMENT; Location: [facility]; Surgeon: Lieutenant Diego, MD   ENDOSCOPIC RETROGRADE CHOLANGIOPANCREATOGRAPHY (ERCP) WITH PROPOFOL N/A 11/22/2022   Procedure: ENDOSCOPIC RETROGRADE CHOLANGIOPANCREATOGRAPHY (ERCP) WITH PROPOFOL;  Surgeon: Iva Boop,  MD;  Location: [facility] ENDOSCOPY;  Service: Gastroenterology;  Laterality: N/A;   ERCP N/A 12/02/2022   Procedure: ENDOSCOPIC RETROGRADE CHOLANGIOPANCREATOGRAPHY (ERCP);  Surgeon: [name]., MD;  Location: [facility] ENDOSCOPY;  Service: Gastroenterology;  Laterality: N/A;   ESOPHAGOGASTRODUODENOSCOPY N/A 03/03/2000   ESOPHAGOGASTRODUODENOSCOPY (EGD) WITH PROPOFOL N/A 10/16/2021   Procedure: ESOPHAGOGASTRODUODENOSCOPY (EGD) WITH PROPOFOL;  Surgeon: Regis Bill, MD;  Location: [facility] ENDOSCOPY;  Service: Endoscopy;  Laterality:  N/A;   ESOPHAGOGASTRODUODENOSCOPY (EGD) WITH PROPOFOL N/A 01/31/2023   Procedure: ESOPHAGOGASTRODUODENOSCOPY (EGD) WITH PROPOFOL;  Surgeon: Imogene Burn, MD;  Location: [facility] ENDOSCOPY;  Service: Gastroenterology;  Laterality: N/A;   ESOPHAGOGASTRODUODENOSCOPY (EGD) WITH PROPOFOL N/A 02/16/2023   Procedure: ESOPHAGOGASTRODUODENOSCOPY (EGD) WITH PROPOFOL;  Surgeon: Meridee Score Netty Starring., MD;  Location: [facility] ENDOSCOPY;  Service: Gastroenterology;  Laterality: N/A;   EUS N/A 02/16/2023   Procedure: UPPER ENDOSCOPIC ULTRASOUND (EUS) LINEAR;  Surgeon: [name]., MD;  Location: [facility] ENDOSCOPY;  Service: Gastroenterology;  Laterality: N/A;   EXTRACORPOREAL SHOCK WAVE LITHOTRIPSY Left 08/06/2021   Procedure: EXTRACORPOREAL SHOCK WAVE LITHOTRIPSY (ESWL);  Surgeon: Vanna Scotland, MD;  Location: [facility] ORS;  Service: Urology;  Laterality: Left;   INGUINAL HERNIA REPAIR Right 11/27/2021   Procedure: HERNIA REPAIR INGUINAL ADULT;  Surgeon: Earline Mayotte, MD;  Location: [facility] ORS;  Service: General;  Laterality: Right;   INTRAOCULAR LENS EXCHANGE Left 12/27/2018   Procedure: MECHANICAL VITRECTOMY,  EXCHANGE LENS PROSTHESIS VIA PARS PLANA APPROACH; Location: [facility]; Surgeon: Nicholaus Corolla, MD   MOHS SURGERY Bilateral 01/27/2015   Procedure: MOHS SURGERY TO REMOVE BASAL CELL CARCINOMA FROM RIGHT/LEFT NASAL ALA; Location: [facility]; Surgeon: Katrine Coho, MD   REMOVAL OF STONES  12/02/2022   Procedure: REMOVAL OF STONES;  Surgeon: Meridee Score Netty Starring., MD;  Location: [facility] ENDOSCOPY;  Service: Gastroenterology;;   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   Procedure: RPR RETINAL DTCHMNT W/VITRECTOMY ANY METH REPAIR RETINAL DETACHMENT; WITH VITRECTOMY, INC, WHEN PERFORMED, AIR/GAS TAMPONADE, FOCAL ENDOLASER PHOTOCOAGULATION, CRYOTHERAPY, DRAINAGE OF SUBRETINAL FLUID, SCLERAL BUCKLING AND/OR REMOVAL OF LENS; Location: [facility]; Surgeon: Nicholaus Corolla, MD   SINUSOTOMY N/A    Procedure: MAXILLARY SINUSOTOMY INTRANASAL    SPHINCTEROTOMY  12/02/2022   Procedure: SPHINCTEROTOMY;  Surgeon: Mansouraty, Netty Starring., MD;  Location: [facility] ENDOSCOPY;  Service: Gastroenterology;;   TONSILLECTOMY AND ADENOIDECTOMY N/A    Patient Active Problem List   Diagnosis Date Noted   Hypokalemia 02/16/2023   Gastric outflow obstruction 02/14/2023   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   Acute recurrent pancreatitis 02/13/2023   Intractable nausea and vomiting 02/13/2023   Displaced nasogastric feeding tube 02/12/2023   Esophageal stenosis 02/12/2023   Chronic pancreatitis (HCC) 02/12/2023   Leukocytosis 02/12/2023   Essential hypertension 02/12/2023   History of CAD (coronary artery disease) 02/12/2023   Atrial fibrillation, chronic -not on coagulation (HCC) 02/12/2023   Chronic disease anemia 02/12/2023   Encounter for feeding tube placement 02/12/2023   Malnutrition of moderate degree 02/01/2023   Nausea and vomiting 01/31/2023   Abnormal CT scan 01/31/2023   Acute pancreatitis 01/28/2023   Chronic diarrhea 12/05/2022   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   Hemorrhagic pancreatitis 11/30/2022   History of ERCP 11/30/2022   Biliary obstruction 11/30/2022   Moderate protein-calorie malnutrition (HCC) 11/30/2022   Gallstone pancreatitis 11/30/2022   Choledocholithiasis 11/21/2022   Acute gallstone pancreatitis 11/17/2022   PAF (paroxysmal atrial fibrillation) (HCC) 11/16/2021   Diabetes type 2, controlled (HCC) 05/15/2018   Hyperlipidemia, mixed 10/28/2015   CAD (coronary artery disease) 05/12/2013   GERD (gastroesophageal reflux disease) 05/12/2013  ONSET DATE:  10 months ago following multiple surgeries   REFERRING DIAG: Paralysis of vocal cords and larynx, 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 Pt with PEG placement for pancreatitis.  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 Gradual improvement over time. CT was WNL. Pt reports had repeat laryngoscopy since my last visit and was told it was stronger. Mirror exam today shows good movement and patient's symptoms are improving. Recommend continue home speech exercises.   PAIN:  Are you having pain? No   FALLS: Has patient fallen in last 6 months? No,   LIVING ENVIRONMENT: Lives with: lives with their family Lives in: House/apartment  PLOF: Independent  PATIENT GOALS    to improve vocal quality  SUBJECTIVE STATEMENT: Pt pleasant, continues to be eager, initial hoarseness observed - "every weekend is a good weekend" Pt accompanied by: self  OBJECTIVE:    TODAY'S TREATMENT SESSION:   Skilled treatment session focused on pt's dysphonia goals. SLP facilitated the session by providing the following interventions:  Introduced Radiographer, therapeutic using EMST 150 - explanation provided on muscle strengthening- device initially set at 30 cmH2O with pt progressing to 60 cmH2O to achieve a self-reported effort level of 7 out of 10 - handout provided with instructions on HEP  SLP provided pt with ball (pushing on arm rests of chair was not effective in achieving improved phonation) - when pushing on ball, pt able to achieve louder, improved quality phonation x 6 repetitions. Some initial carryover noted in conversation immediately following sustained "ah".   SLP further facilitated session by having pt complete PROM PATIENT REPORTED OUTCOME MEASURES (PROM):  VOICE HANDICAP INDEX (VHI)  The Voice Handicap Index is comprised of a series of questions to assess the patient's perception of their voice. It is designed to evaluate the emotional, physical and functional components of the voice  problem.  Functional: 11 Physical: 25 Emotional: 3 Total: 39 (Normal mean 8.75, SD =14.97)  z score =  2.0 moderate = 2.00-2.99,    PATIENT EDUCATION: Education details: see above Person educated: Patient Education method: Explanation Education comprehension: needs further education   HOME EXERCISE PROGRAM: Pushing during phonation Increasing hydration EMST     GOALS: Goals reviewed with patient? Yes  SHORT TERM GOALS: Target date: 10 sessions  The patient will maximize voice quality and loudness using breath support/oral resonance for sustained vowel production, pitch glides, and hierarchal speech drill.  Baseline: Goal status: INITIAL  2.  The patient will demonstrate abdominal breathing patterns and steady release of breath on exhalation to optimize efficiency of voicing and decrease laryngeal hyperfunction.  Baseline:  Goal status: INITIAL  3.  The patient will increase hydration for an eventual goal of 6-8 glasses per day and limit caffeine intake (to maximum of 1-2, 8 oz cups/day), as measured by patient report.  Baseline:  Goal status: INITIAL  LONG TERM GOALS: Target date: 07/13/2023  The patient will participate in 5-8 minutes conversation, maintaining average loudness of 75 dB and loud, good quality voice with min cues.   Baseline:  Goal status: INITIAL  2.  The patient will be independent for abdominal breathing and breath support exercises.  Baseline:  Goal status: INITIAL  3.  Patient will report improved communication effectiveness as measured by PROM Baseline:  Goal status: INITIAL   ASSESSMENT:  CLINICAL IMPRESSION: Patient is a 82 y.o. 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   While pt is able to increase vocal intensity with less fatigue during today's session. Will continue  to target respiratory strengthening and building endurance. See the above treatment note for details.   OBJECTIVE IMPAIRMENTS include voice disorder. These impairments are limiting patient from effectively communicating at home and in community. Factors affecting potential to achieve goals and functional outcome are  decreased PO consumption . Patient will benefit from skilled SLP services to address above impairments and improve overall function.  REHAB POTENTIAL: Good  PLAN: SLP FREQUENCY: 1-2x/week  SLP DURATION: 8 weeks  PLANNED INTERVENTIONS: SLP instruction and feedback and Patient/family education   Ebone Alcivar B. Dreama Saa, M.S., CCC-SLP, Tree surgeon Certified Brain Injury Specialist [facility]  [facility] Rehabilitation Services Office [phone] Ascom [phone] Fax [phone]

## 2023-06-01 ENCOUNTER — Ambulatory Visit: Payer: HMO

## 2023-06-01 ENCOUNTER — Ambulatory Visit: Payer: HMO | Admitting: Speech Pathology

## 2023-06-01 DIAGNOSIS — R490 Dysphonia: Secondary | ICD-10-CM

## 2023-06-01 DIAGNOSIS — R2689 Other abnormalities of gait and mobility: Secondary | ICD-10-CM

## 2023-06-01 DIAGNOSIS — M6281 Muscle weakness (generalized): Secondary | ICD-10-CM

## 2023-06-01 DIAGNOSIS — R262 Difficulty in walking, not elsewhere classified: Secondary | ICD-10-CM

## 2023-06-01 DIAGNOSIS — R269 Unspecified abnormalities of gait and mobility: Secondary | ICD-10-CM

## 2023-06-01 NOTE — Therapy (Signed)
OUTPATIENT SPEECH LANGUAGE PATHOLOGY  VOICE TREATMENT NOTE   Patient Name: Saliou Mcilvain MRN: [id number] DOB:06/27/1941, 83 y.o., male Today's Date: 06/01/2023  PCP: Bethann Punches, MD REFERRING PROVIDER: Bud Face, MD   End of Session - 06/01/23 1357     Visit Number 4    Number of Visits 17    Date for SLP Re-Evaluation 07/13/23    Authorization Type [employer]    Progress Note Due on Visit 10    SLP Start Time 1358    SLP Stop Time  1443    SLP Time Calculation (min) 45 min    Activity Tolerance Patient tolerated treatment well             Past Medical History:  Diagnosis Date   Anginal pain (HCC)    Aortic atherosclerosis (HCC)    Aortic root enlargement (HCC) 12/27/2017   a.) cCTA 12/27/2017: measured 3.9 cm.   Basal cell carcinoma of skin    a.) s/p Mohs procedure R/L nasal ala 01/27/2015   CAD (coronary artery disease) 05/12/2013   a.) STEMI --> LHC 05/12/2013: 20% p-mLAD, 20% D2, 40% LPDA, 30% LPAV, 99% dLCx --> PCI placing 4.5 x 20 mm and 2.5 x 12 mm Veriflex BMS to LPAV extending to dLCx. b.) cCTA 12/27/2017: Ca score 1578; 82nd percentile for age/sex match control; CT-FFR: RCA 0.99, pLAD 0.97, mLAD 0.98, dLAD 0.89, pLCx 0.98, mLCx 0.97, dLCx 0.97, PLB 0.91, PDA (most distal segment) 0.80.   Cataract    a.) s/p extraction with IOL placement   CKD (chronic kidney disease)    Diverticulosis    GERD (gastroesophageal reflux disease)    History of kidney stones 2023   HLD (hyperlipidemia)    Left lower lobe pulmonary nodule 08/01/2021   a.) CT 08/01/2021: measured 4 mm; perifissural   Long term current use of anticoagulant    a.) apixaban   NSVT (nonsustained ventricular tachycardia) (HCC) 05/13/2013   a.) 24 beat run while in ICU following PCI for STEMI   PAF (paroxysmal atrial fibrillation) (HCC) 11/16/2021   a.) new onset in ED on 11/16/2021. b.) CHA2DS2-VASc = 4 (age x 2, STEMI/aortic plaque, T2DM). c.) rate controlled without  pharmacological intervention; started on apixaban 11/16/2021, however PCP discontinued on 11/18/2021 pending Holter study results.   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   Type 2 diabetes, diet controlled [facility])    Past Surgical History:  Procedure Laterality Date   BIOPSY  12/02/2022   Procedure: BIOPSY;  Surgeon: Meridee Score Netty Starring., MD;  Location: [facility] ENDOSCOPY;  Service: Gastroenterology;;   BIOPSY  01/31/2023   Procedure: BIOPSY;  Surgeon: Imogene Burn, MD;  Location: [facility] ENDOSCOPY;  Service: Gastroenterology;;   CATARACT EXTRACTION W/ INTRAOCULAR LENS IMPLANT Bilateral    CHOLECYSTECTOMY N/A 12/03/2022   Procedure: LAPAROSCOPIC CHOLECYSTECTOMY;  Surgeon: [name], MD;  Location: [facility] OR;  Service: General;  Laterality: N/A;   COLONOSCOPY N/A 01/31/2001   COLONOSCOPY N/A 11/19/2008   CORONARY ANGIOPLASTY WITH STENT PLACEMENT Left 05/12/2013   Procedure: CORONARY ANGIOPLASTY WITH STENT PLACEMENT; Location: [facility]; Surgeon: Lieutenant Diego, MD   ENDOSCOPIC RETROGRADE CHOLANGIOPANCREATOGRAPHY (ERCP) WITH PROPOFOL N/A 11/22/2022   Procedure: ENDOSCOPIC RETROGRADE CHOLANGIOPANCREATOGRAPHY (ERCP) WITH PROPOFOL;  Surgeon: Iva Boop,  MD;  Location: [facility] ENDOSCOPY;  Service: Gastroenterology;  Laterality: N/A;   ERCP N/A 12/02/2022   Procedure: ENDOSCOPIC RETROGRADE CHOLANGIOPANCREATOGRAPHY (ERCP);  Surgeon: [name]., MD;  Location: [facility] ENDOSCOPY;  Service: Gastroenterology;  Laterality: N/A;   ESOPHAGOGASTRODUODENOSCOPY N/A 03/03/2000   ESOPHAGOGASTRODUODENOSCOPY (EGD) WITH PROPOFOL N/A 10/16/2021   Procedure: ESOPHAGOGASTRODUODENOSCOPY (EGD) WITH PROPOFOL;  Surgeon: Regis Bill, MD;  Location: [facility] ENDOSCOPY;  Service: Endoscopy;  Laterality:  N/A;   ESOPHAGOGASTRODUODENOSCOPY (EGD) WITH PROPOFOL N/A 01/31/2023   Procedure: ESOPHAGOGASTRODUODENOSCOPY (EGD) WITH PROPOFOL;  Surgeon: Imogene Burn, MD;  Location: [facility] ENDOSCOPY;  Service: Gastroenterology;  Laterality: N/A;   ESOPHAGOGASTRODUODENOSCOPY (EGD) WITH PROPOFOL N/A 02/16/2023   Procedure: ESOPHAGOGASTRODUODENOSCOPY (EGD) WITH PROPOFOL;  Surgeon: Meridee Score Netty Starring., MD;  Location: [facility] ENDOSCOPY;  Service: Gastroenterology;  Laterality: N/A;   EUS N/A 02/16/2023   Procedure: UPPER ENDOSCOPIC ULTRASOUND (EUS) LINEAR;  Surgeon: [name]., MD;  Location: [facility] ENDOSCOPY;  Service: Gastroenterology;  Laterality: N/A;   EXTRACORPOREAL SHOCK WAVE LITHOTRIPSY Left 08/06/2021   Procedure: EXTRACORPOREAL SHOCK WAVE LITHOTRIPSY (ESWL);  Surgeon: Vanna Scotland, MD;  Location: [facility] ORS;  Service: Urology;  Laterality: Left;   INGUINAL HERNIA REPAIR Right 11/27/2021   Procedure: HERNIA REPAIR INGUINAL ADULT;  Surgeon: Earline Mayotte, MD;  Location: [facility] ORS;  Service: General;  Laterality: Right;   INTRAOCULAR LENS EXCHANGE Left 12/27/2018   Procedure: MECHANICAL VITRECTOMY,  EXCHANGE LENS PROSTHESIS VIA PARS PLANA APPROACH; Location: [facility]; Surgeon: Nicholaus Corolla, MD   MOHS SURGERY Bilateral 01/27/2015   Procedure: MOHS SURGERY TO REMOVE BASAL CELL CARCINOMA FROM RIGHT/LEFT NASAL ALA; Location: [facility]; Surgeon: Katrine Coho, MD   REMOVAL OF STONES  12/02/2022   Procedure: REMOVAL OF STONES;  Surgeon: Meridee Score Netty Starring., MD;  Location: [facility] ENDOSCOPY;  Service: Gastroenterology;;   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   Procedure: RPR RETINAL DTCHMNT W/VITRECTOMY ANY METH REPAIR RETINAL DETACHMENT; WITH VITRECTOMY, INC, WHEN PERFORMED, AIR/GAS TAMPONADE, FOCAL ENDOLASER PHOTOCOAGULATION, CRYOTHERAPY, DRAINAGE OF SUBRETINAL FLUID, SCLERAL BUCKLING AND/OR REMOVAL OF LENS; Location: [facility]; Surgeon: Nicholaus Corolla, MD   SINUSOTOMY N/A    Procedure: MAXILLARY SINUSOTOMY INTRANASAL    SPHINCTEROTOMY  12/02/2022   Procedure: SPHINCTEROTOMY;  Surgeon: Mansouraty, Netty Starring., MD;  Location: [facility] ENDOSCOPY;  Service: Gastroenterology;;   TONSILLECTOMY AND ADENOIDECTOMY N/A    Patient Active Problem List   Diagnosis Date Noted   Hypokalemia 02/16/2023   Gastric outflow obstruction 02/14/2023   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   Acute recurrent pancreatitis 02/13/2023   Intractable nausea and vomiting 02/13/2023   Displaced nasogastric feeding tube 02/12/2023   Esophageal stenosis 02/12/2023   Chronic pancreatitis (HCC) 02/12/2023   Leukocytosis 02/12/2023   Essential hypertension 02/12/2023   History of CAD (coronary artery disease) 02/12/2023   Atrial fibrillation, chronic -not on coagulation (HCC) 02/12/2023   Chronic disease anemia 02/12/2023   Encounter for feeding tube placement 02/12/2023   Malnutrition of moderate degree 02/01/2023   Nausea and vomiting 01/31/2023   Abnormal CT scan 01/31/2023   Acute pancreatitis 01/28/2023   Chronic diarrhea 12/05/2022   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   Hemorrhagic pancreatitis 11/30/2022   History of ERCP 11/30/2022   Biliary obstruction 11/30/2022   Moderate protein-calorie malnutrition (HCC) 11/30/2022   Gallstone pancreatitis 11/30/2022   Choledocholithiasis 11/21/2022   Acute gallstone pancreatitis 11/17/2022   PAF (paroxysmal atrial fibrillation) (HCC) 11/16/2021   Diabetes type 2, controlled (HCC) 05/15/2018   Hyperlipidemia, mixed 10/28/2015   CAD (coronary artery disease) 05/12/2013   GERD (gastroesophageal reflux disease) 05/12/2013  ONSET DATE:  10 months ago following multiple surgeries   REFERRING DIAG: Paralysis of vocal cords and larynx, 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 Pt with PEG placement for pancreatitis.  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 Gradual improvement over time. CT was WNL. Pt reports had repeat laryngoscopy since my last visit and was told it was stronger. Mirror exam today shows good movement and patient's symptoms are improving. Recommend continue home speech exercises.   PAIN:  Are you having pain? No   FALLS: Has patient fallen in last 6 months? No,   LIVING ENVIRONMENT: Lives with: lives with their family Lives in: House/apartment  PLOF: Independent  PATIENT GOALS    to improve vocal quality  SUBJECTIVE STATEMENT: Pt pleasant, continues to be eager, initial hoarseness observed - "every weekend is a good weekend" Pt accompanied by: self  OBJECTIVE:    TODAY'S TREATMENT SESSION:   Skilled treatment session focused on pt's dysphonia goals. SLP facilitated the session by providing the following interventions:  Introduced Radiographer, therapeutic using EMST 150 - explanation provided on muscle strengthening- 3 sets of 10 reps completed 60 cmH2O to achieve a self-reported effort level of 7 out of 10 -  SLP provided pt with ball - when pushing on ball, pt able to achieve louder, improved quality phonation x 10 repetitions  Pt completed the following ex's with goal of 85 db: Me-Me-May-My-Moe-Moo; x5 ; 87 dB mod cueing for breath support Sustained ah: x5 - average 8s, 82dB, mod cueing Ascending and Descending Pitch Glides: 81-82 db, mod cueing  Education provided re: role of breathing in voice and rationale for ex's and EMST   PATIENT EDUCATION: Education details: see above Person educated: Patient Education method: Explanation Education comprehension: needs further education   HOME EXERCISE PROGRAM: Pushing during phonation Increasing hydration EMST     GOALS: Goals  reviewed with patient? Yes  SHORT TERM GOALS: Target date: 10 sessions  The patient will maximize voice quality and loudness using breath support/oral resonance for sustained vowel production, pitch glides, and hierarchal speech drill.  Baseline: Goal status: INITIAL  2.  The patient will demonstrate abdominal breathing patterns and steady release of breath on exhalation to optimize efficiency of voicing and decrease laryngeal hyperfunction.  Baseline:  Goal status: INITIAL  3.  The patient will increase hydration for an eventual goal of 6-8 glasses per day and limit caffeine intake (to maximum of 1-2, 8 oz cups/day), as measured by patient report.  Baseline:  Goal status: INITIAL  LONG TERM GOALS: Target date: 07/13/2023  The patient will participate in 5-8 minutes conversation, maintaining average loudness of 75 dB and loud, good quality voice with min cues.   Baseline:  Goal status: INITIAL  2.  The patient will be independent for abdominal breathing and breath support exercises.  Baseline:  Goal status: INITIAL  3.  Patient will report improved communication effectiveness as measured by PROM Baseline:  Goal status: INITIAL   ASSESSMENT:  CLINICAL IMPRESSION: Patient is a 82 y.o. 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 He presents with mild to moderate dysphonia that is c/b decreased breath support, hoarse, raspy,  breathy phonation.   While pt is able to increase vocal intensity with less fatigue during today's session. Will continue to target respiratory strengthening and building endurance. See the above treatment note for details.   OBJECTIVE IMPAIRMENTS include voice disorder. These impairments are limiting patient from effectively communicating at home and in community. Factors affecting potential to achieve goals and functional outcome are  decreased PO consumption . Patient will benefit from skilled SLP services to  address above impairments and improve overall function.  REHAB POTENTIAL: Good  PLAN: SLP FREQUENCY: 1-2x/week  SLP DURATION: 8 weeks  PLANNED INTERVENTIONS: SLP instruction and feedback and Patient/family education   Clyde Canterbury, M.S., CCC-SLP Speech-Language Pathologist [facility] [phone] (ASCOM)

## 2023-06-01 NOTE — Therapy (Signed)
OUTPATIENT PHYSICAL THERAPY TREATMENT   Patient Name: Jon Bray MRN: [id number] DOB:Jun 22, 1941, 82 y.o., male Today's Date: 06/01/2023   PCP: Danella Penton, MD REFERRING PROVIDER: Danella Penton, MD  END OF SESSION:  PT End of Session - 06/01/23 1440     Visit Number 7    Number of Visits 24    Date for PT Re-Evaluation 08/02/23    Progress Note Due on Visit 10    Equipment Utilized During Treatment Gait belt    Activity Tolerance Patient tolerated treatment well    Behavior During Therapy [facility] for tasks assessed/performed             Past Medical History:  Diagnosis Date   Anginal pain (HCC)    Aortic atherosclerosis (HCC)    Aortic root enlargement (HCC) 12/27/2017   a.) cCTA 12/27/2017: measured 3.9 cm.   Basal cell carcinoma of skin    a.) s/p Mohs procedure R/L nasal ala 01/27/2015   CAD (coronary artery disease) 05/12/2013   a.) STEMI --> LHC 05/12/2013: 20% p-mLAD, 20% D2, 40% LPDA, 30% LPAV, 99% dLCx --> PCI placing 4.5 x 20 mm and 2.5 x 12 mm Veriflex BMS to LPAV extending to dLCx. b.) cCTA 12/27/2017: Ca score 1578; 82nd percentile for age/sex match control; CT-FFR: RCA 0.99, pLAD 0.97, mLAD 0.98, dLAD 0.89, pLCx 0.98, mLCx 0.97, dLCx 0.97, PLB 0.91, PDA (most distal segment) 0.80.   Cataract    a.) s/p extraction with IOL placement   CKD (chronic kidney disease)    Diverticulosis    GERD (gastroesophageal reflux disease)    History of kidney stones 2023   HLD (hyperlipidemia)    Left lower lobe pulmonary nodule 08/01/2021   a.) CT 08/01/2021: measured 4 mm; perifissural   Long term current use of anticoagulant    a.) apixaban   NSVT (nonsustained ventricular tachycardia) (HCC) 05/13/2013   a.) 24 beat run while in ICU following PCI for STEMI   PAF (paroxysmal atrial fibrillation) (HCC) 11/16/2021   a.) new onset in ED on 11/16/2021. b.) CHA2DS2-VASc = 4 (age x 2, STEMI/aortic plaque, T2DM). c.) rate controlled without pharmacological intervention;  started on apixaban 11/16/2021, however PCP discontinued on 11/18/2021 pending Holter study results.   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   Type 2 diabetes, diet controlled [facility])    Past Surgical History:  Procedure Laterality Date   BIOPSY  12/02/2022   Procedure: BIOPSY;  Surgeon: Meridee Score Netty Starring., MD;  Location: [facility] ENDOSCOPY;  Service: Gastroenterology;;   BIOPSY  01/31/2023   Procedure: BIOPSY;  Surgeon: Imogene Burn, MD;  Location: [facility] ENDOSCOPY;  Service: Gastroenterology;;   CATARACT EXTRACTION W/ INTRAOCULAR LENS IMPLANT Bilateral    CHOLECYSTECTOMY N/A 12/03/2022   Procedure: LAPAROSCOPIC CHOLECYSTECTOMY;  Surgeon: [name], MD;  Location: [facility] OR;  Service: General;  Laterality: N/A;   COLONOSCOPY N/A 01/31/2001   COLONOSCOPY N/A 11/19/2008   CORONARY ANGIOPLASTY WITH STENT PLACEMENT Left 05/12/2013   Procedure: CORONARY ANGIOPLASTY WITH STENT PLACEMENT; Location: [facility]; Surgeon: Lieutenant Diego, MD   ENDOSCOPIC RETROGRADE CHOLANGIOPANCREATOGRAPHY (ERCP) WITH PROPOFOL N/A 11/22/2022   Procedure: ENDOSCOPIC RETROGRADE CHOLANGIOPANCREATOGRAPHY (ERCP) WITH PROPOFOL;  Surgeon: Iva Boop, MD;  Location: [facility] ENDOSCOPY;  Service: Gastroenterology;  Laterality: N/A;   ERCP N/A 12/02/2022   Procedure: ENDOSCOPIC RETROGRADE CHOLANGIOPANCREATOGRAPHY (ERCP);  Surgeon: [name]., MD;  Location: [facility] ENDOSCOPY;  Service: Gastroenterology;  Laterality: N/A;   ESOPHAGOGASTRODUODENOSCOPY N/A 03/03/2000   ESOPHAGOGASTRODUODENOSCOPY (EGD) WITH PROPOFOL N/A 10/16/2021   Procedure: ESOPHAGOGASTRODUODENOSCOPY (EGD) WITH PROPOFOL;  Surgeon: Regis Bill, MD;  Location: [facility] ENDOSCOPY;  Service: Endoscopy;  Laterality: N/A;    ESOPHAGOGASTRODUODENOSCOPY (EGD) WITH PROPOFOL N/A 01/31/2023   Procedure: ESOPHAGOGASTRODUODENOSCOPY (EGD) WITH PROPOFOL;  Surgeon: Imogene Burn, MD;  Location: [facility] ENDOSCOPY;  Service: Gastroenterology;  Laterality: N/A;   ESOPHAGOGASTRODUODENOSCOPY (EGD) WITH PROPOFOL N/A 02/16/2023   Procedure: ESOPHAGOGASTRODUODENOSCOPY (EGD) WITH PROPOFOL;  Surgeon: Meridee Score Netty Starring., MD;  Location: [facility] ENDOSCOPY;  Service: Gastroenterology;  Laterality: N/A;   EUS N/A 02/16/2023   Procedure: UPPER ENDOSCOPIC ULTRASOUND (EUS) LINEAR;  Surgeon: [name]., MD;  Location: [facility] ENDOSCOPY;  Service: Gastroenterology;  Laterality: N/A;   EXTRACORPOREAL SHOCK WAVE LITHOTRIPSY Left 08/06/2021   Procedure: EXTRACORPOREAL SHOCK WAVE LITHOTRIPSY (ESWL);  Surgeon: Vanna Scotland, MD;  Location: [facility] ORS;  Service: Urology;  Laterality: Left;   INGUINAL HERNIA REPAIR Right 11/27/2021   Procedure: HERNIA REPAIR INGUINAL ADULT;  Surgeon: Earline Mayotte, MD;  Location: [facility] ORS;  Service: General;  Laterality: Right;   INTRAOCULAR LENS EXCHANGE Left 12/27/2018   Procedure: MECHANICAL VITRECTOMY,  EXCHANGE LENS PROSTHESIS VIA PARS PLANA APPROACH; Location: [facility]; Surgeon: Nicholaus Corolla, MD   MOHS SURGERY Bilateral 01/27/2015   Procedure: MOHS SURGERY TO REMOVE BASAL CELL CARCINOMA FROM RIGHT/LEFT NASAL ALA; Location: [facility]; Surgeon: Katrine Coho, MD   REMOVAL OF STONES  12/02/2022   Procedure: REMOVAL OF STONES;  Surgeon: Meridee Score Netty Starring., MD;  Location: [facility] ENDOSCOPY;  Service: Gastroenterology;;   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   Procedure: RPR RETINAL DTCHMNT W/VITRECTOMY ANY METH REPAIR RETINAL DETACHMENT; WITH VITRECTOMY, INC, WHEN PERFORMED, AIR/GAS TAMPONADE, FOCAL ENDOLASER PHOTOCOAGULATION, CRYOTHERAPY, DRAINAGE OF SUBRETINAL FLUID, SCLERAL BUCKLING AND/OR REMOVAL OF LENS; Location: [facility]; Surgeon: Nicholaus Corolla, MD   SINUSOTOMY N/A    Procedure: MAXILLARY SINUSOTOMY INTRANASAL    SPHINCTEROTOMY  12/02/2022   Procedure: SPHINCTEROTOMY;  Surgeon: Mansouraty, Netty Starring., MD;  Location: [facility] ENDOSCOPY;  Service: Gastroenterology;;   TONSILLECTOMY AND ADENOIDECTOMY N/A    Patient Active Problem List   Diagnosis Date Noted   Hypokalemia 02/16/2023   Gastric outflow obstruction 02/14/2023   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   Acute recurrent pancreatitis 02/13/2023   Intractable nausea and vomiting 02/13/2023   Displaced nasogastric feeding tube 02/12/2023   Esophageal stenosis 02/12/2023   Chronic pancreatitis (HCC) 02/12/2023   Leukocytosis 02/12/2023   Essential hypertension 02/12/2023   History of CAD (coronary artery disease) 02/12/2023   Atrial fibrillation, chronic -not on coagulation (HCC) 02/12/2023   Chronic disease anemia 02/12/2023   Encounter for feeding tube placement 02/12/2023   Malnutrition of moderate degree 02/01/2023   Nausea and vomiting 01/31/2023   Abnormal CT scan 01/31/2023   Acute pancreatitis 01/28/2023   Chronic diarrhea 12/05/2022   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   Hemorrhagic pancreatitis 11/30/2022   History of ERCP 11/30/2022   Biliary obstruction 11/30/2022   Moderate protein-calorie malnutrition (HCC) 11/30/2022   Gallstone pancreatitis 11/30/2022   Choledocholithiasis 11/21/2022   Acute gallstone pancreatitis 11/17/2022   PAF (paroxysmal atrial fibrillation) (HCC) 11/16/2021   Diabetes type 2, controlled (HCC) 05/15/2018   Hyperlipidemia, mixed 10/28/2015   CAD (coronary artery disease) 05/12/2013   GERD (gastroesophageal reflux disease) 05/12/2013    ONSET DATE: 11/03/2022  REFERRING DIAG: R53.1 (  ICD-10-CM) - Weakness   THERAPY DIAG:  Abnormality of gait and mobility  Muscle weakness (generalized)  Other abnormalities of gait and mobility  Difficulty in walking, not elsewhere classified  Rationale for Evaluation and Treatment: Rehabilitation  SUBJECTIVE:                                                                                                                                                                                              SUBJECTIVE STATEMENT: Pt reports minimal soreness in legs from exercising at gym and with last session but feeling good today- no pain and denies any falls.   Pt accompanied by: self  PERTINENT HISTORY:  Pt stated that he was diagnosed with pancreatitis in May of 2024 and has been in and out of the hospital ever since. Pt stated that he lost 35 lbs in 2 weeks between May & June of 2024 and has had difficulty gaining weight since then. Pt stated that he has been utilizing a feeding tube for the past 45 days. Ever since diagnosis of pancreatitis, pt feels that he has lost muscle tone, muscle strength, and endurance. Pt stated that he goes to the gym 2 days a week. Pt has had PT at [facility] rehab, as well as home health and was told he needed PT that was more advanced. Pt feels like balance has been regressing ever since hospitalization in May. 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 PMH: Pancreatitis, CAD, GERD, Hyperlipidemia, Diabetes Type 2, Paroxysmal Atrial Fibrillation, Malnutrition, Hypertension     PAIN:  Are you having pain? No   PRECAUTIONS: None  RED FLAGS: None   WEIGHT BEARING RESTRICTIONS: No  FALLS: Has patient fallen in last 6 months? No  LIVING ENVIRONMENT: Lives with: lives with their family and lives with their spouse Lives in: House/apartment Stairs: Yes: External: 2 steps; on right going up Has following equipment at home: Single point cane  PLOF: Independent  PATIENT GOALS: Increase muscle strength, endurance, and get back to being able to long drive [place] the golf [place]   OBJECTIVE:  Note: Objective measures were completed at Evaluation unless otherwise noted.   LOWER EXTREMITY MMT:    MMT Right Eval Left Eval  Hip flexion 4 4-  Knee extension 4 4  Ankle dorsiflexion 4 4-  (Blank rows =  not tested)  BED MOBILITY:  WFL  FUNCTIONAL TESTS:  5 times sit to stand: 10 seconds 10 meter walk test: 6.16 seconds  PATIENT SURVEYS:  FOTO 56  TODAY'S TREATMENT:  DATE:  06/01/23   THEREX:    Sit to stand without UE support 2x10 Standing calf stretch on slant board without UE support 1 minute Forward Lunges without UE support 10 reps alt LE Retro lunges without UE support 10 reps alt LE Standing Hip Abductions 3# AW 2x10 without UE support Standing Hip Extensions 3# AW 2x10 without UE support   NMR:  -Step ups on 6" step  x 10 reps alt LE without UE support  -Obstacle course in gym- consisting of stepping over various diameter bolsters (3)  then step onto airex pad then off then around 3 cones and over 2 orange hurdles- then back x 3 trials.  - same as above but rather than walking forward- all above performed with lateral  stepping (down and back)  x 2 trials.   -Static tandem standing on airex beam 3x30 sec each LE  -Tandem walking on airex beam without UE support  down and back x 10 times.        PATIENT EDUCATION: Education details: POC Person educated: Patient Education method: Explanation Education comprehension: verbalized understanding   HOME EXERCISE PROGRAM: Access Code: [email or web address] URL: [email or web address] Date: 05/13/2023 Prepared by: Thresa Ross  Exercises - Standing Single Leg Stance with Counter Support  - 1 x daily - 7 x weekly - 3 sets - 30 sec  hold - Standing Heel Raise  - 1 x daily - 7 x weekly - 2 sets - 10 reps - 3 second hold  HOME EXERCISE PROGRAM: To be administered next visit  GOALS: Goals reviewed with patient? No  SHORT TERM GOALS: Target date: 06/07/2023    Patient will be independent in home exercise program to improve strength/mobility for better functional independence with  ADLs. Baseline: No HEP currently  Goal status: INITIAL    LONG TERM GOALS: Target date: 08/02/2023    Pt will increase FOTO score to at least 65 in order to demonstrate increased independence with ADLs.  Baseline: 05/10/23: 56 Goal status: INITIAL  2.  Pt will decrease 5xSTS by 2 seconds in order to demonstrate increased functional mobility strength in lower extremities.  Baseline: 05/10/23: 10 seconds  Goal status: INITIAL  3.  Pt will improve 200 feet or more during the in order to demonstrate increased endurance to perform IADLs.  Baseline: 11/8: 1070 ft  Goal status: INITIAL  4.  Pt will increase Mini Best Balance test score by 4 points in order to demonstrate increased balance, so that the patient can safely play a full game of golf.  Baseline: 11/8:22 Goal status: INITIAL      ASSESSMENT:  CLINICAL IMPRESSION:  Patient presents with good motivation for today's session. He was able to increase weighted resistance today without any significant difficulty. He was challenged with obstacle course activities initially - unsteady with 1 LOB- min assist to recover yet improved with more steadiness with each trial. Pt will continue to benefit from skilled physical therapy intervention to address impairments, improve QOL, and attain therapy goals.    OBJECTIVE IMPAIRMENTS: decreased endurance.   ACTIVITY LIMITATIONS:  N/A  PARTICIPATION LIMITATIONS:  N/A  PERSONAL FACTORS: 1 comorbidity: pancreatitis  are also affecting patient's functional outcome.   REHAB POTENTIAL: Good  CLINICAL DECISION MAKING: Evolving/moderate complexity  EVALUATION COMPLEXITY: Low  PLAN:  PT FREQUENCY: 2x/week  PT DURATION: 12 weeks  PLANNED INTERVENTIONS: 97110-Therapeutic exercises, 97530- Therapeutic activity, [id number]- Neuromuscular re-education, 97535- Self Care, and [id number]- Manual therapy  PLAN FOR NEXT SESSION: endurance, dynamic balance  program, progress HEP as indicated.     Lenda Kelp, PT 06/01/2023, 2:41 PM  Debara Pickett, SPT  This entire session was performed under the direct supervision of a licensed physical therapist. I have reviewed and agree with student's findings and recommendations.   2:41 PM, 06/01/23 Larae Grooms, PT Physical Therapist - [facility]  Outpatient Physical Therapy- Main Campus [phone]

## 2023-06-07 ENCOUNTER — Ambulatory Visit: Payer: HMO | Attending: Internal Medicine | Admitting: Speech Pathology

## 2023-06-07 ENCOUNTER — Encounter: Payer: Self-pay | Admitting: Physical Therapy

## 2023-06-07 ENCOUNTER — Ambulatory Visit: Payer: HMO | Admitting: Physical Therapy

## 2023-06-07 DIAGNOSIS — M6281 Muscle weakness (generalized): Secondary | ICD-10-CM | POA: Insufficient documentation

## 2023-06-07 DIAGNOSIS — R262 Difficulty in walking, not elsewhere classified: Secondary | ICD-10-CM | POA: Diagnosis not present

## 2023-06-07 DIAGNOSIS — R269 Unspecified abnormalities of gait and mobility: Secondary | ICD-10-CM | POA: Diagnosis not present

## 2023-06-07 DIAGNOSIS — R490 Dysphonia: Secondary | ICD-10-CM | POA: Insufficient documentation

## 2023-06-07 DIAGNOSIS — R2689 Other abnormalities of gait and mobility: Secondary | ICD-10-CM

## 2023-06-07 NOTE — Therapy (Signed)
OUTPATIENT PHYSICAL THERAPY TREATMENT   Patient Name: Jon Bray MRN: [id number] DOB:05-23-1941, 82 y.o., male Today's Date: 06/07/2023   PCP: Danella Penton, MD REFERRING PROVIDER: Danella Penton, MD  END OF SESSION:  PT End of Session - 06/07/23 0920     Visit Number 8    Number of Visits 24    Date for PT Re-Evaluation 08/02/23    Progress Note Due on Visit 10    PT Start Time 0930    PT Stop Time 1013    PT Time Calculation (min) 43 min    Equipment Utilized During Treatment Gait belt    Activity Tolerance Patient tolerated treatment well    Behavior During Therapy WFL for tasks assessed/performed             Past Medical History:  Diagnosis Date   Anginal pain (HCC)    Aortic atherosclerosis (HCC)    Aortic root enlargement (HCC) 12/27/2017   a.) cCTA 12/27/2017: measured 3.9 cm.   Basal cell carcinoma of skin    a.) s/p Mohs procedure R/L nasal ala 01/27/2015   CAD (coronary artery disease) 05/12/2013   a.) STEMI --> LHC 05/12/2013: 20% p-mLAD, 20% D2, 40% LPDA, 30% LPAV, 99% dLCx --> PCI placing 4.5 x 20 mm and 2.5 x 12 mm Veriflex BMS to LPAV extending to dLCx. b.) cCTA 12/27/2017: Ca score 1578; 82nd percentile for age/sex match control; CT-FFR: RCA 0.99, pLAD 0.97, mLAD 0.98, dLAD 0.89, pLCx 0.98, mLCx 0.97, dLCx 0.97, PLB 0.91, PDA (most distal segment) 0.80.   Cataract    a.) s/p extraction with IOL placement   CKD (chronic kidney disease)    Diverticulosis    GERD (gastroesophageal reflux disease)    History of kidney stones 2023   HLD (hyperlipidemia)    Left lower lobe pulmonary nodule 08/01/2021   a.) CT 08/01/2021: measured 4 mm; perifissural   Long term current use of anticoagulant    a.) apixaban   NSVT (nonsustained ventricular tachycardia) (HCC) 05/13/2013   a.) 24 beat run while in ICU following PCI for STEMI   PAF (paroxysmal atrial fibrillation) (HCC) 11/16/2021   a.) new onset in ED on 11/16/2021. b.) CHA2DS2-VASc = 4 (age x 2,  STEMI/aortic plaque, T2DM). c.) rate controlled without pharmacological intervention; started on apixaban 11/16/2021, however PCP discontinued on 11/18/2021 pending Holter study results.   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   Type 2 diabetes, diet controlled [facility])    Past Surgical History:  Procedure Laterality Date   BIOPSY  12/02/2022   Procedure: BIOPSY;  Surgeon: Meridee Score Netty Starring., MD;  Location: [facility] ENDOSCOPY;  Service: Gastroenterology;;   BIOPSY  01/31/2023   Procedure: BIOPSY;  Surgeon: Imogene Burn, MD;  Location: [facility] ENDOSCOPY;  Service: Gastroenterology;;   CATARACT EXTRACTION W/ INTRAOCULAR LENS IMPLANT Bilateral    CHOLECYSTECTOMY N/A 12/03/2022   Procedure: LAPAROSCOPIC CHOLECYSTECTOMY;  Surgeon: [name], MD;  Location: [facility] OR;  Service: General;  Laterality: N/A;   COLONOSCOPY N/A 01/31/2001   COLONOSCOPY N/A 11/19/2008   CORONARY ANGIOPLASTY WITH STENT PLACEMENT Left 05/12/2013   Procedure: CORONARY ANGIOPLASTY WITH STENT PLACEMENT; Location: [facility]; Surgeon: Lieutenant Diego, MD   ENDOSCOPIC RETROGRADE CHOLANGIOPANCREATOGRAPHY (ERCP) WITH PROPOFOL N/A 11/22/2022  Procedure: ENDOSCOPIC RETROGRADE CHOLANGIOPANCREATOGRAPHY (ERCP) WITH PROPOFOL;  Surgeon: Iva Boop, MD;  Location: [facility] ENDOSCOPY;  Service: Gastroenterology;  Laterality: N/A;   ERCP N/A 12/02/2022   Procedure: ENDOSCOPIC RETROGRADE CHOLANGIOPANCREATOGRAPHY (ERCP);  Surgeon: [name]., MD;  Location: [facility] ENDOSCOPY;  Service: Gastroenterology;  Laterality: N/A;   ESOPHAGOGASTRODUODENOSCOPY N/A 03/03/2000   ESOPHAGOGASTRODUODENOSCOPY (EGD) WITH PROPOFOL N/A 10/16/2021   Procedure: ESOPHAGOGASTRODUODENOSCOPY (EGD) WITH PROPOFOL;  Surgeon: Regis Bill, MD;   Location: [facility] ENDOSCOPY;  Service: Endoscopy;  Laterality: N/A;   ESOPHAGOGASTRODUODENOSCOPY (EGD) WITH PROPOFOL N/A 01/31/2023   Procedure: ESOPHAGOGASTRODUODENOSCOPY (EGD) WITH PROPOFOL;  Surgeon: Imogene Burn, MD;  Location: [facility] ENDOSCOPY;  Service: Gastroenterology;  Laterality: N/A;   ESOPHAGOGASTRODUODENOSCOPY (EGD) WITH PROPOFOL N/A 02/16/2023   Procedure: ESOPHAGOGASTRODUODENOSCOPY (EGD) WITH PROPOFOL;  Surgeon: Meridee Score Netty Starring., MD;  Location: [facility] ENDOSCOPY;  Service: Gastroenterology;  Laterality: N/A;   EUS N/A 02/16/2023   Procedure: UPPER ENDOSCOPIC ULTRASOUND (EUS) LINEAR;  Surgeon: [name]., MD;  Location: [facility] ENDOSCOPY;  Service: Gastroenterology;  Laterality: N/A;   EXTRACORPOREAL SHOCK WAVE LITHOTRIPSY Left 08/06/2021   Procedure: EXTRACORPOREAL SHOCK WAVE LITHOTRIPSY (ESWL);  Surgeon: Vanna Scotland, MD;  Location: [facility] ORS;  Service: Urology;  Laterality: Left;   INGUINAL HERNIA REPAIR Right 11/27/2021   Procedure: HERNIA REPAIR INGUINAL ADULT;  Surgeon: Earline Mayotte, MD;  Location: [facility] ORS;  Service: General;  Laterality: Right;   INTRAOCULAR LENS EXCHANGE Left 12/27/2018   Procedure: MECHANICAL VITRECTOMY,  EXCHANGE LENS PROSTHESIS VIA PARS PLANA APPROACH; Location: [facility]; Surgeon: Nicholaus Corolla, MD   MOHS SURGERY Bilateral 01/27/2015   Procedure: MOHS SURGERY TO REMOVE BASAL CELL CARCINOMA FROM RIGHT/LEFT NASAL ALA; Location: [facility]; Surgeon: Katrine Coho, MD   REMOVAL OF STONES  12/02/2022   Procedure: REMOVAL OF STONES;  Surgeon: Meridee Score Netty Starring., MD;  Location: [facility] ENDOSCOPY;  Service: Gastroenterology;;   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   Procedure: RPR RETINAL DTCHMNT W/VITRECTOMY ANY METH REPAIR RETINAL DETACHMENT; WITH VITRECTOMY, INC, WHEN PERFORMED, AIR/GAS TAMPONADE, FOCAL ENDOLASER PHOTOCOAGULATION, CRYOTHERAPY, DRAINAGE OF SUBRETINAL FLUID, SCLERAL BUCKLING AND/OR REMOVAL OF LENS; Location: [facility]; Surgeon: Nicholaus Corolla, MD    SINUSOTOMY N/A    Procedure: MAXILLARY SINUSOTOMY INTRANASAL   SPHINCTEROTOMY  12/02/2022   Procedure: SPHINCTEROTOMY;  Surgeon: Mansouraty, Netty Starring., MD;  Location: [facility] ENDOSCOPY;  Service: Gastroenterology;;   TONSILLECTOMY AND ADENOIDECTOMY N/A    Patient Active Problem List   Diagnosis Date Noted   Hypokalemia 02/16/2023   Gastric outflow obstruction 02/14/2023   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   Acute recurrent pancreatitis 02/13/2023   Intractable nausea and vomiting 02/13/2023   Displaced nasogastric feeding tube 02/12/2023   Esophageal stenosis 02/12/2023   Chronic pancreatitis (HCC) 02/12/2023   Leukocytosis 02/12/2023   Essential hypertension 02/12/2023   History of CAD (coronary artery disease) 02/12/2023   Atrial fibrillation, chronic -not on coagulation (HCC) 02/12/2023   Chronic disease anemia 02/12/2023   Encounter for feeding tube placement 02/12/2023   Malnutrition of moderate degree 02/01/2023   Nausea and vomiting 01/31/2023   Abnormal CT scan 01/31/2023   Acute pancreatitis 01/28/2023   Chronic diarrhea 12/05/2022   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   Hemorrhagic pancreatitis 11/30/2022   History of ERCP 11/30/2022   Biliary obstruction 11/30/2022   Moderate protein-calorie malnutrition (HCC) 11/30/2022   Gallstone pancreatitis 11/30/2022   Choledocholithiasis 11/21/2022   Acute gallstone pancreatitis 11/17/2022   PAF (paroxysmal atrial fibrillation) (HCC) 11/16/2021   Diabetes type 2, controlled (HCC) 05/15/2018   Hyperlipidemia, mixed 10/28/2015   CAD (  coronary artery disease) 05/12/2013   GERD (gastroesophageal reflux disease) 05/12/2013    ONSET DATE: 11/03/2022  REFERRING DIAG: R53.1 (ICD-10-CM) - Weakness   THERAPY DIAG:  Abnormality of gait and mobility  Muscle weakness (generalized)  Other abnormalities of gait and mobility  Difficulty in walking, not elsewhere classified  Rationale for Evaluation and Treatment:  Rehabilitation  SUBJECTIVE:                                                                                                                                                                                             SUBJECTIVE STATEMENT: Pt reported 0/10 on NPS and no falls since last visit.   Pt accompanied by: self  PERTINENT HISTORY:  Pt stated that he was diagnosed with pancreatitis in May of 2024 and has been in and out of the hospital ever since. Pt stated that he lost 35 lbs in 2 weeks between May & June of 2024 and has had difficulty gaining weight since then. Pt stated that he has been utilizing a feeding tube for the past 45 days. Ever since diagnosis of pancreatitis, pt feels that he has lost muscle tone, muscle strength, and endurance. Pt stated that he goes to the gym 2 days a week. Pt has had PT at [facility] rehab, as well as home health and was told he needed PT that was more advanced. Pt feels like balance has been regressing ever since hospitalization in May. 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 PMH: Pancreatitis, CAD, GERD, Hyperlipidemia, Diabetes Type 2, Paroxysmal Atrial Fibrillation, Malnutrition, Hypertension     PAIN:  Are you having pain? No   PRECAUTIONS: None  RED FLAGS: None   WEIGHT BEARING RESTRICTIONS: No  FALLS: Has patient fallen in last 6 months? No  LIVING ENVIRONMENT: Lives with: lives with their family and lives with their spouse Lives in: House/apartment Stairs: Yes: External: 2 steps; on right going up Has following equipment at home: Single point cane  PLOF: Independent  PATIENT GOALS: Increase muscle strength, endurance, and get back to being able to long drive on the golf [place]   OBJECTIVE:  Note: Objective measures were completed at Evaluation unless otherwise noted.   LOWER EXTREMITY MMT:    MMT Right Eval Left Eval  Hip flexion 4 4-  Knee extension 4 4  Ankle dorsiflexion 4 4-  (Blank  rows = not tested)  BED MOBILITY:  WFL  FUNCTIONAL TESTS:  5 times sit to stand: 10 seconds 10 meter walk test: 6.16 seconds  PATIENT SURVEYS:  FOTO 56  TODAY'S TREATMENT:  DATE:  06/07/23   THEREX:    Sit to stand without UE support 2x10 Standing calf stretch on slant board without UE support 1 minute Forward Lunges without UE support 10 reps alt LE Retro lunges without UE support 10 reps alt LE Standing Hip Abductions 3# AW 2x10 without UE support Standing Hip Extensions 3# AW 2x10 without UE support   NMR:  -Step ups on 6" step  x 10 reps alt LE without UE support  -Obstacle course in gym- consisting of stepping over various diameter bolsters (3)  then step onto airex pad then off then around 3 cones and over 2 orange hurdles- then back x 2 trials.  - same as above but rather than walking forward- all above performed with lateral  stepping (down and back)  x 3 trials.   -Static tandem standing on airex beam 3x30 sec each LE  -Tandem walking on airex beam without UE support  down and back x 10 times.        PATIENT EDUCATION: Education details: POC Person educated: Patient Education method: Explanation Education comprehension: verbalized understanding   HOME EXERCISE PROGRAM: Access Code: [email or web address] URL: [email or web address] Date: 05/13/2023 Prepared by: Thresa Ross  Exercises - Standing Single Leg Stance with Counter Support  - 1 x daily - 7 x weekly - 3 sets - 30 sec  hold - Standing Heel Raise  - 1 x daily - 7 x weekly - 2 sets - 10 reps - 3 second hold  HOME EXERCISE PROGRAM: To be administered next visit  GOALS: Goals reviewed with patient? No  SHORT TERM GOALS: Target date: 06/07/2023    Patient will be independent in home exercise program to improve strength/mobility for better functional independence  with ADLs. Baseline: No HEP currently  Goal status: INITIAL    LONG TERM GOALS: Target date: 08/02/2023    Pt will increase FOTO score to at least 65 in order to demonstrate increased independence with ADLs.  Baseline: 05/10/23: 56 Goal status: INITIAL  2.  Pt will decrease 5xSTS by 2 seconds in order to demonstrate increased functional mobility strength in lower extremities.  Baseline: 05/10/23: 10 seconds  Goal status: INITIAL  3.  Pt will improve 200 feet or more during the in order to demonstrate increased endurance to perform IADLs.  Baseline: 11/8: 1070 ft  Goal status: INITIAL  4.  Pt will increase Mini Best Balance test score by 4 points in order to demonstrate increased balance, so that the patient can safely play a full game of golf.  Baseline: 11/8:22 Goal status: INITIAL      ASSESSMENT:  CLINICAL IMPRESSION:  Pt tolerated all tasks during today's visit. Pt was able to navigate the obstacle course this session more efficiently and without any LOB. Pt stated that he feels that his balance has improved as a result of PT. Pt will continue to benefit from skilled physical therapy intervention to address impairments, improve QOL, and attain therapy goals.    OBJECTIVE IMPAIRMENTS: decreased endurance.   ACTIVITY LIMITATIONS:  N/A  PARTICIPATION LIMITATIONS:  N/A  PERSONAL FACTORS: 1 comorbidity: pancreatitis  are also affecting patient's functional outcome.   REHAB POTENTIAL: Good  CLINICAL DECISION MAKING: Evolving/moderate complexity  EVALUATION COMPLEXITY: Low  PLAN:  PT FREQUENCY: 2x/week  PT DURATION: 12 weeks  PLANNED INTERVENTIONS: 97110-Therapeutic exercises, 97530- Therapeutic activity, 97112- Neuromuscular re-education, 97535- Self Care, and 10175- Manual therapy  PLAN FOR NEXT SESSION: endurance, dynamic balance program, progress HEP as indicated.  Debara Pickett, Student-PT 06/07/2023, 10:20 AM  Debara Pickett, SPT  This entire session  was performed under the direct supervision of a licensed physical therapist. I have reviewed and agree with student's findings and recommendations.   Norman Herrlich PT ,DPT Physical Therapist- [place]  [facility]

## 2023-06-07 NOTE — Therapy (Signed)
OUTPATIENT SPEECH LANGUAGE PATHOLOGY  VOICE TREATMENT NOTE   Patient Name: Jon Bray MRN: [id number] DOB:Dec 03, 1940, 82 y.o., male Today's Date: 06/07/2023  PCP: Bethann Punches, MD REFERRING PROVIDER: Bud Face, MD   End of Session - 06/07/23 0850     Visit Number 5    Number of Visits 17    Date for SLP Re-Evaluation 07/13/23    Authorization Type [employer]    Progress Note Due on Visit 10    SLP Start Time 0845    SLP Stop Time  0930    SLP Time Calculation (min) 45 min    Activity Tolerance Patient tolerated treatment well             Past Medical History:  Diagnosis Date   Anginal pain (HCC)    Aortic atherosclerosis (HCC)    Aortic root enlargement (HCC) 12/27/2017   a.) cCTA 12/27/2017: measured 3.9 cm.   Basal cell carcinoma of skin    a.) s/p Mohs procedure R/L nasal ala 01/27/2015   CAD (coronary artery disease) 05/12/2013   a.) STEMI --> LHC 05/12/2013: 20% p-mLAD, 20% D2, 40% LPDA, 30% LPAV, 99% dLCx --> PCI placing 4.5 x 20 mm and 2.5 x 12 mm Veriflex BMS to LPAV extending to dLCx. b.) cCTA 12/27/2017: Ca score 1578; 82nd percentile for age/sex match control; CT-FFR: RCA 0.99, pLAD 0.97, mLAD 0.98, dLAD 0.89, pLCx 0.98, mLCx 0.97, dLCx 0.97, PLB 0.91, PDA (most distal segment) 0.80.   Cataract    a.) s/p extraction with IOL placement   CKD (chronic kidney disease)    Diverticulosis    GERD (gastroesophageal reflux disease)    History of kidney stones 2023   HLD (hyperlipidemia)    Left lower lobe pulmonary nodule 08/01/2021   a.) CT 08/01/2021: measured 4 mm; perifissural   Long term current use of anticoagulant    a.) apixaban   NSVT (nonsustained ventricular tachycardia) (HCC) 05/13/2013   a.) 24 beat run while in ICU following PCI for STEMI   PAF (paroxysmal atrial fibrillation) (HCC) 11/16/2021   a.) new onset in ED on 11/16/2021. b.) CHA2DS2-VASc = 4 (age x 2, STEMI/aortic plaque, T2DM). c.) rate controlled without  pharmacological intervention; started on apixaban 11/16/2021, however PCP discontinued on 11/18/2021 pending Holter study results.   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   Type 2 diabetes, diet controlled [facility])    Past Surgical History:  Procedure Laterality Date   BIOPSY  12/02/2022   Procedure: BIOPSY;  Surgeon: Meridee Score Netty Starring., MD;  Location: [facility] ENDOSCOPY;  Service: Gastroenterology;;   BIOPSY  01/31/2023   Procedure: BIOPSY;  Surgeon: Imogene Burn, MD;  Location: [facility] ENDOSCOPY;  Service: Gastroenterology;;   CATARACT EXTRACTION W/ INTRAOCULAR LENS IMPLANT Bilateral    CHOLECYSTECTOMY N/A 12/03/2022   Procedure: LAPAROSCOPIC CHOLECYSTECTOMY;  Surgeon: [name], MD;  Location: [facility] OR;  Service: General;  Laterality: N/A;   COLONOSCOPY N/A 01/31/2001   COLONOSCOPY N/A 11/19/2008   CORONARY ANGIOPLASTY WITH STENT PLACEMENT Left 05/12/2013   Procedure: CORONARY ANGIOPLASTY WITH STENT PLACEMENT; Location: [facility]; Surgeon: Lieutenant Diego, MD   ENDOSCOPIC RETROGRADE CHOLANGIOPANCREATOGRAPHY (ERCP) WITH PROPOFOL N/A 11/22/2022   Procedure: ENDOSCOPIC RETROGRADE CHOLANGIOPANCREATOGRAPHY (ERCP) WITH PROPOFOL;  Surgeon: Iva Boop,  MD;  Location: [facility] ENDOSCOPY;  Service: Gastroenterology;  Laterality: N/A;   ERCP N/A 12/02/2022   Procedure: ENDOSCOPIC RETROGRADE CHOLANGIOPANCREATOGRAPHY (ERCP);  Surgeon: [name]., MD;  Location: [facility] ENDOSCOPY;  Service: Gastroenterology;  Laterality: N/A;   ESOPHAGOGASTRODUODENOSCOPY N/A 03/03/2000   ESOPHAGOGASTRODUODENOSCOPY (EGD) WITH PROPOFOL N/A 10/16/2021   Procedure: ESOPHAGOGASTRODUODENOSCOPY (EGD) WITH PROPOFOL;  Surgeon: Regis Bill, MD;  Location: [facility] ENDOSCOPY;  Service: Endoscopy;  Laterality:  N/A;   ESOPHAGOGASTRODUODENOSCOPY (EGD) WITH PROPOFOL N/A 01/31/2023   Procedure: ESOPHAGOGASTRODUODENOSCOPY (EGD) WITH PROPOFOL;  Surgeon: Imogene Burn, MD;  Location: [facility] ENDOSCOPY;  Service: Gastroenterology;  Laterality: N/A;   ESOPHAGOGASTRODUODENOSCOPY (EGD) WITH PROPOFOL N/A 02/16/2023   Procedure: ESOPHAGOGASTRODUODENOSCOPY (EGD) WITH PROPOFOL;  Surgeon: Meridee Score Netty Starring., MD;  Location: [facility] ENDOSCOPY;  Service: Gastroenterology;  Laterality: N/A;   EUS N/A 02/16/2023   Procedure: UPPER ENDOSCOPIC ULTRASOUND (EUS) LINEAR;  Surgeon: [name]., MD;  Location: [facility] ENDOSCOPY;  Service: Gastroenterology;  Laterality: N/A;   EXTRACORPOREAL SHOCK WAVE LITHOTRIPSY Left 08/06/2021   Procedure: EXTRACORPOREAL SHOCK WAVE LITHOTRIPSY (ESWL);  Surgeon: Vanna Scotland, MD;  Location: [facility] ORS;  Service: Urology;  Laterality: Left;   INGUINAL HERNIA REPAIR Right 11/27/2021   Procedure: HERNIA REPAIR INGUINAL ADULT;  Surgeon: Earline Mayotte, MD;  Location: [facility] ORS;  Service: General;  Laterality: Right;   INTRAOCULAR LENS EXCHANGE Left 12/27/2018   Procedure: MECHANICAL VITRECTOMY,  EXCHANGE LENS PROSTHESIS VIA PARS PLANA APPROACH; Location: [facility]; Surgeon: Nicholaus Corolla, MD   MOHS SURGERY Bilateral 01/27/2015   Procedure: MOHS SURGERY TO REMOVE BASAL CELL CARCINOMA FROM RIGHT/LEFT NASAL ALA; Location: [facility]; Surgeon: Katrine Coho, MD   REMOVAL OF STONES  12/02/2022   Procedure: REMOVAL OF STONES;  Surgeon: Meridee Score Netty Starring., MD;  Location: [facility] ENDOSCOPY;  Service: Gastroenterology;;   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   Procedure: RPR RETINAL DTCHMNT W/VITRECTOMY ANY METH REPAIR RETINAL DETACHMENT; WITH VITRECTOMY, INC, WHEN PERFORMED, AIR/GAS TAMPONADE, FOCAL ENDOLASER PHOTOCOAGULATION, CRYOTHERAPY, DRAINAGE OF SUBRETINAL FLUID, SCLERAL BUCKLING AND/OR REMOVAL OF LENS; Location: [facility]; Surgeon: Nicholaus Corolla, MD   SINUSOTOMY N/A    Procedure: MAXILLARY SINUSOTOMY INTRANASAL    SPHINCTEROTOMY  12/02/2022   Procedure: SPHINCTEROTOMY;  Surgeon: Mansouraty, Netty Starring., MD;  Location: [facility] ENDOSCOPY;  Service: Gastroenterology;;   TONSILLECTOMY AND ADENOIDECTOMY N/A    Patient Active Problem List   Diagnosis Date Noted   Hypokalemia 02/16/2023   Gastric outflow obstruction 02/14/2023   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   Acute recurrent pancreatitis 02/13/2023   Intractable nausea and vomiting 02/13/2023   Displaced nasogastric feeding tube 02/12/2023   Esophageal stenosis 02/12/2023   Chronic pancreatitis (HCC) 02/12/2023   Leukocytosis 02/12/2023   Essential hypertension 02/12/2023   History of CAD (coronary artery disease) 02/12/2023   Atrial fibrillation, chronic -not on coagulation (HCC) 02/12/2023   Chronic disease anemia 02/12/2023   Encounter for feeding tube placement 02/12/2023   Malnutrition of moderate degree 02/01/2023   Nausea and vomiting 01/31/2023   Abnormal CT scan 01/31/2023   Acute pancreatitis 01/28/2023   Chronic diarrhea 12/05/2022   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   Hemorrhagic pancreatitis 11/30/2022   History of ERCP 11/30/2022   Biliary obstruction 11/30/2022   Moderate protein-calorie malnutrition (HCC) 11/30/2022   Gallstone pancreatitis 11/30/2022   Choledocholithiasis 11/21/2022   Acute gallstone pancreatitis 11/17/2022   PAF (paroxysmal atrial fibrillation) (HCC) 11/16/2021   Diabetes type 2, controlled (HCC) 05/15/2018   Hyperlipidemia, mixed 10/28/2015   CAD (coronary artery disease) 05/12/2013   GERD (gastroesophageal reflux disease) 05/12/2013  ONSET DATE:  10 months ago following multiple surgeries   REFERRING DIAG: Paralysis of vocal cords and larynx, 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 Pt with PEG placement for pancreatitis.  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 Gradual improvement over time. CT was WNL. Pt reports had repeat laryngoscopy since my last visit and was told it was stronger. Mirror exam today shows good movement and patient's symptoms are improving. Recommend continue home speech exercises.   PAIN:  Are you having pain? No   FALLS: Has patient fallen in last 6 months? No,   LIVING ENVIRONMENT: Lives with: lives with their family Lives in: House/apartment  PLOF: Independent  PATIENT GOALS    to improve vocal quality  SUBJECTIVE STATEMENT: Pt eager and reports that he had a great Thanksgiving Pt accompanied by: self  OBJECTIVE:    TODAY'S TREATMENT SESSION:   Skilled treatment session focused on pt's dysphonia goals. SLP facilitated the session by providing the following interventions:  Expiratory Muscle Training Device using EMST 150 - 3 sets of 10 reps completed 60 cmH2O to achieve a self-reported effort level of 7 out of 10 - attempted increasing resistance but pt unable to perform quick burst of air  Pt reports that he performs EMST as it bedside place that he sits in den. He doesn't perform as many pushing during phonation because he doesn't have a ball beside his chair  SLP created sticky note for pt to put at his spot in kitchen where he typically sits. To imitate sitting at bar, pt was able to pull against SLP table to produce increased phonation and improve vocal quality. This improved vocal quality was observed during carry over conversation - pt benefits from cues to use increased respiratory support during conversation to improve speech intelligibility from ~ 75% to > 90%  Education provided re: role of breathing in voice and rationale for ex's and EMST   PATIENT EDUCATION: Education details: see  above Person educated: Patient Education method: Explanation Education comprehension: needs further education   HOME EXERCISE PROGRAM: Pushing during phonation Increasing hydration EMST     GOALS: Goals reviewed with patient? Yes  SHORT TERM GOALS: Target date: 10 sessions  The patient will maximize voice quality and loudness using breath support/oral resonance for sustained vowel production, pitch glides, and hierarchal speech drill.  Baseline: Goal status: INITIAL  2.  The patient will demonstrate abdominal breathing patterns and steady release of breath on exhalation to optimize efficiency of voicing and decrease laryngeal hyperfunction.  Baseline:  Goal status: INITIAL  3.  The patient will increase hydration for an eventual goal of 6-8 glasses per day and limit caffeine intake (to maximum of 1-2, 8 oz cups/day), as measured by patient report.  Baseline:  Goal status: INITIAL  LONG TERM GOALS: Target date: 07/13/2023  The patient will participate in 5-8 minutes conversation, maintaining average loudness of 75 dB and loud, good quality voice with min cues.   Baseline:  Goal status: INITIAL  2.  The patient will be independent for abdominal breathing and breath support exercises.  Baseline:  Goal status: INITIAL  3.  Patient will report improved communication effectiveness as measured by PROM Baseline:  Goal status:  INITIAL   ASSESSMENT:  CLINICAL IMPRESSION: Patient is a 82 y.o. 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   While pt is able to increase vocal intensity with less fatigue during today's session. Will continue to target respiratory strengthening and building endurance. See the above treatment note for details.   OBJECTIVE IMPAIRMENTS include voice disorder. These impairments are limiting  patient from effectively communicating at home and in community. Factors affecting potential to achieve goals and functional outcome are  decreased PO consumption . Patient will benefit from skilled SLP services to address above impairments and improve overall function.  REHAB POTENTIAL: Good  PLAN: SLP FREQUENCY: 1-2x/week  SLP DURATION: 8 weeks  PLANNED INTERVENTIONS: SLP instruction and feedback and Patient/family education  Skyelyn Scruggs B. Dreama Saa, M.S., CCC-SLP, Tree surgeon Certified Brain Injury Specialist [facility]  [facility] Rehabilitation Services Office [phone] Ascom [phone] Fax [phone]

## 2023-06-08 ENCOUNTER — Ambulatory Visit: Payer: HMO

## 2023-06-08 ENCOUNTER — Encounter: Payer: HMO | Admitting: Speech Pathology

## 2023-06-09 ENCOUNTER — Ambulatory Visit: Payer: HMO

## 2023-06-09 ENCOUNTER — Ambulatory Visit: Payer: HMO | Admitting: Speech Pathology

## 2023-06-09 ENCOUNTER — Encounter: Payer: HMO | Admitting: Speech Pathology

## 2023-06-09 DIAGNOSIS — R490 Dysphonia: Secondary | ICD-10-CM | POA: Diagnosis not present

## 2023-06-09 DIAGNOSIS — R262 Difficulty in walking, not elsewhere classified: Secondary | ICD-10-CM

## 2023-06-09 DIAGNOSIS — M6281 Muscle weakness (generalized): Secondary | ICD-10-CM

## 2023-06-09 DIAGNOSIS — R2689 Other abnormalities of gait and mobility: Secondary | ICD-10-CM

## 2023-06-09 DIAGNOSIS — R269 Unspecified abnormalities of gait and mobility: Secondary | ICD-10-CM

## 2023-06-09 NOTE — Therapy (Signed)
OUTPATIENT SPEECH LANGUAGE PATHOLOGY  VOICE TREATMENT NOTE   Patient Name: Jon Bray MRN: [id number] DOB:03-Feb-1941, 82 y.o., male Today's Date: 06/09/2023  PCP: Bethann Punches, MD REFERRING PROVIDER: Bud Face, MD   End of Session - 06/09/23 1017     Visit Number 6    Number of Visits 17    Date for SLP Re-Evaluation 07/13/23    Authorization Type [employer]    Progress Note Due on Visit 10    SLP Start Time 1015    SLP Stop Time  1100    SLP Time Calculation (min) 45 min    Activity Tolerance Patient tolerated treatment well             Past Medical History:  Diagnosis Date   Anginal pain (HCC)    Aortic atherosclerosis (HCC)    Aortic root enlargement (HCC) 12/27/2017   a.) cCTA 12/27/2017: measured 3.9 cm.   Basal cell carcinoma of skin    a.) s/p Mohs procedure R/L nasal ala 01/27/2015   CAD (coronary artery disease) 05/12/2013   a.) STEMI --> LHC 05/12/2013: 20% p-mLAD, 20% D2, 40% LPDA, 30% LPAV, 99% dLCx --> PCI placing 4.5 x 20 mm and 2.5 x 12 mm Veriflex BMS to LPAV extending to dLCx. b.) cCTA 12/27/2017: Ca score 1578; 82nd percentile for age/sex match control; CT-FFR: RCA 0.99, pLAD 0.97, mLAD 0.98, dLAD 0.89, pLCx 0.98, mLCx 0.97, dLCx 0.97, PLB 0.91, PDA (most distal segment) 0.80.   Cataract    a.) s/p extraction with IOL placement   CKD (chronic kidney disease)    Diverticulosis    GERD (gastroesophageal reflux disease)    History of kidney stones 2023   HLD (hyperlipidemia)    Left lower lobe pulmonary nodule 08/01/2021   a.) CT 08/01/2021: measured 4 mm; perifissural   Long term current use of anticoagulant    a.) apixaban   NSVT (nonsustained ventricular tachycardia) (HCC) 05/13/2013   a.) 24 beat run while in ICU following PCI for STEMI   PAF (paroxysmal atrial fibrillation) (HCC) 11/16/2021   a.) new onset in ED on 11/16/2021. b.) CHA2DS2-VASc = 4 (age x 2, STEMI/aortic plaque, T2DM). c.) rate controlled without  pharmacological intervention; started on apixaban 11/16/2021, however PCP discontinued on 11/18/2021 pending Holter study results.   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   Type 2 diabetes, diet controlled [facility])    Past Surgical History:  Procedure Laterality Date   BIOPSY  12/02/2022   Procedure: BIOPSY;  Surgeon: Meridee Score Netty Starring., MD;  Location: [facility] ENDOSCOPY;  Service: Gastroenterology;;   BIOPSY  01/31/2023   Procedure: BIOPSY;  Surgeon: Imogene Burn, MD;  Location: [facility] ENDOSCOPY;  Service: Gastroenterology;;   CATARACT EXTRACTION W/ INTRAOCULAR LENS IMPLANT Bilateral    CHOLECYSTECTOMY N/A 12/03/2022   Procedure: LAPAROSCOPIC CHOLECYSTECTOMY;  Surgeon: [name], MD;  Location: [facility] OR;  Service: General;  Laterality: N/A;   COLONOSCOPY N/A 01/31/2001   COLONOSCOPY N/A 11/19/2008   CORONARY ANGIOPLASTY WITH STENT PLACEMENT Left 05/12/2013   Procedure: CORONARY ANGIOPLASTY WITH STENT PLACEMENT; Location: [facility]; Surgeon: Lieutenant Diego, MD   ENDOSCOPIC RETROGRADE CHOLANGIOPANCREATOGRAPHY (ERCP) WITH PROPOFOL N/A 11/22/2022   Procedure: ENDOSCOPIC RETROGRADE CHOLANGIOPANCREATOGRAPHY (ERCP) WITH PROPOFOL;  Surgeon: Iva Boop,  MD;  Location: [facility] ENDOSCOPY;  Service: Gastroenterology;  Laterality: N/A;   ERCP N/A 12/02/2022   Procedure: ENDOSCOPIC RETROGRADE CHOLANGIOPANCREATOGRAPHY (ERCP);  Surgeon: [name]., MD;  Location: [facility] ENDOSCOPY;  Service: Gastroenterology;  Laterality: N/A;   ESOPHAGOGASTRODUODENOSCOPY N/A 03/03/2000   ESOPHAGOGASTRODUODENOSCOPY (EGD) WITH PROPOFOL N/A 10/16/2021   Procedure: ESOPHAGOGASTRODUODENOSCOPY (EGD) WITH PROPOFOL;  Surgeon: Regis Bill, MD;  Location: [facility] ENDOSCOPY;  Service: Endoscopy;  Laterality:  N/A;   ESOPHAGOGASTRODUODENOSCOPY (EGD) WITH PROPOFOL N/A 01/31/2023   Procedure: ESOPHAGOGASTRODUODENOSCOPY (EGD) WITH PROPOFOL;  Surgeon: Imogene Burn, MD;  Location: [facility] ENDOSCOPY;  Service: Gastroenterology;  Laterality: N/A;   ESOPHAGOGASTRODUODENOSCOPY (EGD) WITH PROPOFOL N/A 02/16/2023   Procedure: ESOPHAGOGASTRODUODENOSCOPY (EGD) WITH PROPOFOL;  Surgeon: Meridee Score Netty Starring., MD;  Location: [facility] ENDOSCOPY;  Service: Gastroenterology;  Laterality: N/A;   EUS N/A 02/16/2023   Procedure: UPPER ENDOSCOPIC ULTRASOUND (EUS) LINEAR;  Surgeon: [name]., MD;  Location: [facility] ENDOSCOPY;  Service: Gastroenterology;  Laterality: N/A;   EXTRACORPOREAL SHOCK WAVE LITHOTRIPSY Left 08/06/2021   Procedure: EXTRACORPOREAL SHOCK WAVE LITHOTRIPSY (ESWL);  Surgeon: Vanna Scotland, MD;  Location: [facility] ORS;  Service: Urology;  Laterality: Left;   INGUINAL HERNIA REPAIR Right 11/27/2021   Procedure: HERNIA REPAIR INGUINAL ADULT;  Surgeon: Earline Mayotte, MD;  Location: [facility] ORS;  Service: General;  Laterality: Right;   INTRAOCULAR LENS EXCHANGE Left 12/27/2018   Procedure: MECHANICAL VITRECTOMY,  EXCHANGE LENS PROSTHESIS VIA PARS PLANA APPROACH; Location: [facility]; Surgeon: Nicholaus Corolla, MD   MOHS SURGERY Bilateral 01/27/2015   Procedure: MOHS SURGERY TO REMOVE BASAL CELL CARCINOMA FROM RIGHT/LEFT NASAL ALA; Location: [facility]; Surgeon: Katrine Coho, MD   REMOVAL OF STONES  12/02/2022   Procedure: REMOVAL OF STONES;  Surgeon: Meridee Score Netty Starring., MD;  Location: [facility] ENDOSCOPY;  Service: Gastroenterology;;   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   Procedure: RPR RETINAL DTCHMNT W/VITRECTOMY ANY METH REPAIR RETINAL DETACHMENT; WITH VITRECTOMY, INC, WHEN PERFORMED, AIR/GAS TAMPONADE, FOCAL ENDOLASER PHOTOCOAGULATION, CRYOTHERAPY, DRAINAGE OF SUBRETINAL FLUID, SCLERAL BUCKLING AND/OR REMOVAL OF LENS; Location: [facility]; Surgeon: Nicholaus Corolla, MD   SINUSOTOMY N/A    Procedure: MAXILLARY SINUSOTOMY INTRANASAL    SPHINCTEROTOMY  12/02/2022   Procedure: SPHINCTEROTOMY;  Surgeon: Mansouraty, Netty Starring., MD;  Location: [facility] ENDOSCOPY;  Service: Gastroenterology;;   TONSILLECTOMY AND ADENOIDECTOMY N/A    Patient Active Problem List   Diagnosis Date Noted   Hypokalemia 02/16/2023   Gastric outflow obstruction 02/14/2023   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   Acute recurrent pancreatitis 02/13/2023   Intractable nausea and vomiting 02/13/2023   Displaced nasogastric feeding tube 02/12/2023   Esophageal stenosis 02/12/2023   Chronic pancreatitis (HCC) 02/12/2023   Leukocytosis 02/12/2023   Essential hypertension 02/12/2023   History of CAD (coronary artery disease) 02/12/2023   Atrial fibrillation, chronic -not on coagulation (HCC) 02/12/2023   Chronic disease anemia 02/12/2023   Encounter for feeding tube placement 02/12/2023   Malnutrition of moderate degree 02/01/2023   Nausea and vomiting 01/31/2023   Abnormal CT scan 01/31/2023   Acute pancreatitis 01/28/2023   Chronic diarrhea 12/05/2022   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   Hemorrhagic pancreatitis 11/30/2022   History of ERCP 11/30/2022   Biliary obstruction 11/30/2022   Moderate protein-calorie malnutrition (HCC) 11/30/2022   Gallstone pancreatitis 11/30/2022   Choledocholithiasis 11/21/2022   Acute gallstone pancreatitis 11/17/2022   PAF (paroxysmal atrial fibrillation) (HCC) 11/16/2021   Diabetes type 2, controlled (HCC) 05/15/2018   Hyperlipidemia, mixed 10/28/2015   CAD (coronary artery disease) 05/12/2013   GERD (gastroesophageal reflux disease) 05/12/2013  ONSET DATE:  10 months ago following multiple surgeries   REFERRING DIAG: Paralysis of vocal cords and larynx, 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 Pt with PEG placement for pancreatitis.  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 Gradual improvement over time. CT was WNL. Pt reports had repeat laryngoscopy since my last visit and was told it was stronger. Mirror exam today shows good movement and patient's symptoms are improving. Recommend continue home speech exercises.   PAIN:  Are you having pain? No   FALLS: Has patient fallen in last 6 months? No,   LIVING ENVIRONMENT: Lives with: lives with their family Lives in: House/apartment  PLOF: Independent  PATIENT GOALS    to improve vocal quality  SUBJECTIVE STATEMENT: Pt had a presentation  Pt accompanied by: self  OBJECTIVE:    TODAY'S TREATMENT SESSION:   Skilled treatment session focused on pt's dysphonia goals. SLP facilitated the session by providing the following interventions:  Expiratory Muscle Training Device using EMST 150 - 3 sets of 10 reps completed 60 cmH2O to achieve a self-reported effort level of 7 out of 10 -    He reports increased practice as he put the sticky note reminder on his frig  With maximal multimodal cues, pt able to achieve improved phonation with increased vocal intensity. He reports that "It takes a lot of effort, I have to really concentrate on the way you want me to do it" He reports 7 out of 10 effort level When repeating functional phrases he required continued cues to increase respiratory support. With maximal cues, pt able to increased breath support by adding breath breaks.   PATIENT EDUCATION: Education details: see above Person educated: Patient Education method: Explanation Education comprehension: needs further education   HOME EXERCISE PROGRAM: Pushing during phonation Increasing hydration EMST     GOALS: Goals reviewed with patient? Yes  SHORT TERM GOALS: Target date: 10  sessions  The patient will maximize voice quality and loudness using breath support/oral resonance for sustained vowel production, pitch glides, and hierarchal speech drill.  Baseline: Goal status: INITIAL  2.  The patient will demonstrate abdominal breathing patterns and steady release of breath on exhalation to optimize efficiency of voicing and decrease laryngeal hyperfunction.  Baseline:  Goal status: INITIAL  3.  The patient will increase hydration for an eventual goal of 6-8 glasses per day and limit caffeine intake (to maximum of 1-2, 8 oz cups/day), as measured by patient report.  Baseline:  Goal status: INITIAL  LONG TERM GOALS: Target date: 07/13/2023  The patient will participate in 5-8 minutes conversation, maintaining average loudness of 75 dB and loud, good quality voice with min cues.   Baseline:  Goal status: INITIAL  2.  The patient will be independent for abdominal breathing and breath support exercises.  Baseline:  Goal status: INITIAL  3.  Patient will report improved communication effectiveness as measured by PROM Baseline:  Goal status: INITIAL   ASSESSMENT:  CLINICAL IMPRESSION: Patient is a 82 y.o. 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   While  pt is able to increase vocal intensity with increased fatigue observed, suspect this is due in part to giving a speech this morning early at local school.  Will continue to target respiratory strengthening and building endurance. See the above treatment note for details.   OBJECTIVE IMPAIRMENTS include voice disorder. These impairments are limiting patient from effectively communicating at home and in community. Factors affecting potential to achieve goals and functional outcome are  decreased PO consumption . Patient will benefit from skilled SLP  services to address above impairments and improve overall function.  REHAB POTENTIAL: Good  PLAN: SLP FREQUENCY: 1-2x/week  SLP DURATION: 8 weeks  PLANNED INTERVENTIONS: SLP instruction and feedback and Patient/family education  Fayola Meckes B. Dreama Saa, M.S., CCC-SLP, Tree surgeon Certified Brain Injury Specialist [facility]  [facility] Office [phone] Ascom [phone] Fax [phone]

## 2023-06-09 NOTE — Therapy (Signed)
OUTPATIENT PHYSICAL THERAPY TREATMENT   Patient Name: Jon Bray MRN: [id number] DOB:1941-06-27, 82 y.o., male Today's Date: 06/09/2023   PCP: Danella Penton, MD REFERRING PROVIDER: Danella Penton, MD  END OF SESSION:  PT End of Session - 06/09/23 1054     Visit Number 9    Number of Visits 24    Date for PT Re-Evaluation 08/02/23    Progress Note Due on Visit 10    PT Start Time 1100    PT Stop Time 1143    PT Time Calculation (min) 43 min    Equipment Utilized During Treatment Gait belt    Activity Tolerance Patient tolerated treatment well    Behavior During Therapy WFL for tasks assessed/performed             Past Medical History:  Diagnosis Date   Anginal pain (HCC)    Aortic atherosclerosis (HCC)    Aortic root enlargement (HCC) 12/27/2017   a.) cCTA 12/27/2017: measured 3.9 cm.   Basal cell carcinoma of skin    a.) s/p Mohs procedure R/L nasal ala 01/27/2015   CAD (coronary artery disease) 05/12/2013   a.) STEMI --> LHC 05/12/2013: 20% p-mLAD, 20% D2, 40% LPDA, 30% LPAV, 99% dLCx --> PCI placing 4.5 x 20 mm and 2.5 x 12 mm Veriflex BMS to LPAV extending to dLCx. b.) cCTA 12/27/2017: Ca score 1578; 82nd percentile for age/sex match control; CT-FFR: RCA 0.99, pLAD 0.97, mLAD 0.98, dLAD 0.89, pLCx 0.98, mLCx 0.97, dLCx 0.97, PLB 0.91, PDA (most distal segment) 0.80.   Cataract    a.) s/p extraction with IOL placement   CKD (chronic kidney disease)    Diverticulosis    GERD (gastroesophageal reflux disease)    History of kidney stones 2023   HLD (hyperlipidemia)    Left lower lobe pulmonary nodule 08/01/2021   a.) CT 08/01/2021: measured 4 mm; perifissural   Long term current use of anticoagulant    a.) apixaban   NSVT (nonsustained ventricular tachycardia) (HCC) 05/13/2013   a.) 24 beat run while in ICU following PCI for STEMI   PAF (paroxysmal atrial fibrillation) (HCC) 11/16/2021   a.) new onset in ED on 11/16/2021. b.) CHA2DS2-VASc = 4 (age x 2,  STEMI/aortic plaque, T2DM). c.) rate controlled without pharmacological intervention; started on apixaban 11/16/2021, however PCP discontinued on 11/18/2021 pending Holter study results.   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   Type 2 diabetes, diet controlled [facility])    Past Surgical History:  Procedure Laterality Date   BIOPSY  12/02/2022   Procedure: BIOPSY;  Surgeon: Meridee Score Netty Starring., MD;  Location: [facility] ENDOSCOPY;  Service: Gastroenterology;;   BIOPSY  01/31/2023   Procedure: BIOPSY;  Surgeon: Imogene Burn, MD;  Location: [facility] ENDOSCOPY;  Service: Gastroenterology;;   CATARACT EXTRACTION W/ INTRAOCULAR LENS IMPLANT Bilateral    CHOLECYSTECTOMY N/A 12/03/2022   Procedure: LAPAROSCOPIC CHOLECYSTECTOMY;  Surgeon: [name], MD;  Location: [facility] OR;  Service: General;  Laterality: N/A;   COLONOSCOPY N/A 01/31/2001   COLONOSCOPY N/A 11/19/2008   CORONARY ANGIOPLASTY WITH STENT PLACEMENT Left 05/12/2013   Procedure: CORONARY ANGIOPLASTY WITH STENT PLACEMENT; Location: [facility]; Surgeon: Lieutenant Diego, MD   ENDOSCOPIC RETROGRADE CHOLANGIOPANCREATOGRAPHY (ERCP) WITH PROPOFOL N/A 11/22/2022  Procedure: ENDOSCOPIC RETROGRADE CHOLANGIOPANCREATOGRAPHY (ERCP) WITH PROPOFOL;  Surgeon: Iva Boop, MD;  Location: [facility] ENDOSCOPY;  Service: Gastroenterology;  Laterality: N/A;   ERCP N/A 12/02/2022   Procedure: ENDOSCOPIC RETROGRADE CHOLANGIOPANCREATOGRAPHY (ERCP);  Surgeon: [name]., MD;  Location: [facility] ENDOSCOPY;  Service: Gastroenterology;  Laterality: N/A;   ESOPHAGOGASTRODUODENOSCOPY N/A 03/03/2000   ESOPHAGOGASTRODUODENOSCOPY (EGD) WITH PROPOFOL N/A 10/16/2021   Procedure: ESOPHAGOGASTRODUODENOSCOPY (EGD) WITH PROPOFOL;  Surgeon: Regis Bill, MD;   Location: [facility] ENDOSCOPY;  Service: Endoscopy;  Laterality: N/A;   ESOPHAGOGASTRODUODENOSCOPY (EGD) WITH PROPOFOL N/A 01/31/2023   Procedure: ESOPHAGOGASTRODUODENOSCOPY (EGD) WITH PROPOFOL;  Surgeon: Imogene Burn, MD;  Location: [facility] ENDOSCOPY;  Service: Gastroenterology;  Laterality: N/A;   ESOPHAGOGASTRODUODENOSCOPY (EGD) WITH PROPOFOL N/A 02/16/2023   Procedure: ESOPHAGOGASTRODUODENOSCOPY (EGD) WITH PROPOFOL;  Surgeon: Meridee Score Netty Starring., MD;  Location: [facility] ENDOSCOPY;  Service: Gastroenterology;  Laterality: N/A;   EUS N/A 02/16/2023   Procedure: UPPER ENDOSCOPIC ULTRASOUND (EUS) LINEAR;  Surgeon: [name]., MD;  Location: [facility] ENDOSCOPY;  Service: Gastroenterology;  Laterality: N/A;   EXTRACORPOREAL SHOCK WAVE LITHOTRIPSY Left 08/06/2021   Procedure: EXTRACORPOREAL SHOCK WAVE LITHOTRIPSY (ESWL);  Surgeon: Vanna Scotland, MD;  Location: [facility] ORS;  Service: Urology;  Laterality: Left;   INGUINAL HERNIA REPAIR Right 11/27/2021   Procedure: HERNIA REPAIR INGUINAL ADULT;  Surgeon: Earline Mayotte, MD;  Location: [facility] ORS;  Service: General;  Laterality: Right;   INTRAOCULAR LENS EXCHANGE Left 12/27/2018   Procedure: MECHANICAL VITRECTOMY,  EXCHANGE LENS PROSTHESIS VIA PARS PLANA APPROACH; Location: [facility]; Surgeon: Nicholaus Corolla, MD   MOHS SURGERY Bilateral 01/27/2015   Procedure: MOHS SURGERY TO REMOVE BASAL CELL CARCINOMA FROM RIGHT/LEFT NASAL ALA; Location: [facility]; Surgeon: Katrine Coho, MD   REMOVAL OF STONES  12/02/2022   Procedure: REMOVAL OF STONES;  Surgeon: Meridee Score Netty Starring., MD;  Location: [facility] ENDOSCOPY;  Service: Gastroenterology;;   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   Procedure: RPR RETINAL DTCHMNT W/VITRECTOMY ANY METH REPAIR RETINAL DETACHMENT; WITH VITRECTOMY, INC, WHEN PERFORMED, AIR/GAS TAMPONADE, FOCAL ENDOLASER PHOTOCOAGULATION, CRYOTHERAPY, DRAINAGE OF SUBRETINAL FLUID, SCLERAL BUCKLING AND/OR REMOVAL OF LENS; Location: [facility]; Surgeon: Nicholaus Corolla, MD    SINUSOTOMY N/A    Procedure: MAXILLARY SINUSOTOMY INTRANASAL   SPHINCTEROTOMY  12/02/2022   Procedure: SPHINCTEROTOMY;  Surgeon: Mansouraty, Netty Starring., MD;  Location: [facility] ENDOSCOPY;  Service: Gastroenterology;;   TONSILLECTOMY AND ADENOIDECTOMY N/A    Patient Active Problem List   Diagnosis Date Noted   Hypokalemia 02/16/2023   Gastric outflow obstruction 02/14/2023   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   Acute recurrent pancreatitis 02/13/2023   Intractable nausea and vomiting 02/13/2023   Displaced nasogastric feeding tube 02/12/2023   Esophageal stenosis 02/12/2023   Chronic pancreatitis (HCC) 02/12/2023   Leukocytosis 02/12/2023   Essential hypertension 02/12/2023   History of CAD (coronary artery disease) 02/12/2023   Atrial fibrillation, chronic -not on coagulation (HCC) 02/12/2023   Chronic disease anemia 02/12/2023   Encounter for feeding tube placement 02/12/2023   Malnutrition of moderate degree 02/01/2023   Nausea and vomiting 01/31/2023   Abnormal CT scan 01/31/2023   Acute pancreatitis 01/28/2023   Chronic diarrhea 12/05/2022   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   Hemorrhagic pancreatitis 11/30/2022   History of ERCP 11/30/2022   Biliary obstruction 11/30/2022   Moderate protein-calorie malnutrition (HCC) 11/30/2022   Gallstone pancreatitis 11/30/2022   Choledocholithiasis 11/21/2022   Acute gallstone pancreatitis 11/17/2022   PAF (paroxysmal atrial fibrillation) (HCC) 11/16/2021   Diabetes type 2, controlled (HCC) 05/15/2018   Hyperlipidemia, mixed 10/28/2015   CAD (  coronary artery disease) 05/12/2013   GERD (gastroesophageal reflux disease) 05/12/2013    ONSET DATE: 11/03/2022  REFERRING DIAG: R53.1 (ICD-10-CM) - Weakness   THERAPY DIAG:  Abnormality of gait and mobility  Muscle weakness (generalized)  Other abnormalities of gait and mobility  Difficulty in walking, not elsewhere classified  Rationale for Evaluation and Treatment:  Rehabilitation  SUBJECTIVE:                                                                                                                                                                                             SUBJECTIVE STATEMENT: Pt reported 0/10 on NPS and no falls or changes since last visit.   Pt accompanied by: self  PERTINENT HISTORY:  Pt stated that he was diagnosed with pancreatitis in May of 2024 and has been in and out of the hospital ever since. Pt stated that he lost 35 lbs in 2 weeks between May & June of 2024 and has had difficulty gaining weight since then. Pt stated that he has been utilizing a feeding tube for the past 45 days. Ever since diagnosis of pancreatitis, pt feels that he has lost muscle tone, muscle strength, and endurance. Pt stated that he goes to the gym 2 days a week. Pt has had PT at [facility] rehab, as well as home health and was told he needed PT that was more advanced. Pt feels like balance has been regressing ever since hospitalization in May. 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 PMH: Pancreatitis, CAD, GERD, Hyperlipidemia, Diabetes Type 2, Paroxysmal Atrial Fibrillation, Malnutrition, Hypertension     PAIN:  Are you having pain? No   PRECAUTIONS: None  RED FLAGS: None   WEIGHT BEARING RESTRICTIONS: No  FALLS: Has patient fallen in last 6 months? No  LIVING ENVIRONMENT: Lives with: lives with their family and lives with their spouse Lives in: House/apartment Stairs: Yes: External: 2 steps; on right going up Has following equipment at home: Single point cane  PLOF: Independent  PATIENT GOALS: Increase muscle strength, endurance, and get back to being able to long drive [place] the golf [place]   OBJECTIVE:  Note: Objective measures were completed at Evaluation unless otherwise noted.   LOWER EXTREMITY MMT:    MMT Right Eval Left Eval  Hip flexion 4 4-  Knee extension 4 4  Ankle dorsiflexion 4  4-  (Blank rows = not tested)  BED MOBILITY:  WFL  FUNCTIONAL TESTS:  5 times sit to stand: 10 seconds 10 meter walk test: 6.16 seconds  PATIENT SURVEYS:  FOTO 56  TODAY'S TREATMENT:  DATE:  06/09/23   THEREX:    Sit to stand without UE support 2x10 Standing calf stretch on slant board without UE support 2 minutes Forward Lunges with 5# dumbbells 10 reps alt LE Retro lunges with 5# dumbbells 10 reps alt LE Standing Hip Abductions 4# AW 2x10 without UE support Standing Hip Extensions 4# AW 2x10 without UE support   NMR:  -Step ups on 6" step  x 10 reps alt LE without UE support -Obstacle course in gym- consisting of stepping over various diameter bolsters (3)  then walking across uneven red mat set up with spiked balls under the mat then off then around 3 cones and over 2 orange hurdles- then back x 2 trials.  - same as above but rather than walking forward- all above performed with lateral  stepping (down and back)  x 2 trials.  -Static tandem standing on airex beam 3x30 sec each LE -Tandem walking on airex beam without UE support  down and back x 10 times.        PATIENT EDUCATION: Education details: POC Person educated: Patient Education method: Explanation Education comprehension: verbalized understanding   HOME EXERCISE PROGRAM: Access Code: [email or web address] URL: [email or web address] Date: 05/13/2023 Prepared by: Thresa Ross  Exercises - Standing Single Leg Stance with Counter Support  - 1 x daily - 7 x weekly - 3 sets - 30 sec  hold - Standing Heel Raise  - 1 x daily - 7 x weekly - 2 sets - 10 reps - 3 second hold  HOME EXERCISE PROGRAM: To be administered next visit  GOALS: Goals reviewed with patient? No  SHORT TERM GOALS: Target date: 06/07/2023    Patient will be independent in home exercise program to improve  strength/mobility for better functional independence with ADLs. Baseline: No HEP currently  Goal status: INITIAL    LONG TERM GOALS: Target date: 08/02/2023    Pt will increase FOTO score to at least 65 in order to demonstrate increased independence with ADLs.  Baseline: 05/10/23: 56 Goal status: INITIAL  2.  Pt will decrease 5xSTS by 2 seconds in order to demonstrate increased functional mobility strength in lower extremities.  Baseline: 05/10/23: 10 seconds  Goal status: INITIAL  3.  Pt will improve 200 feet or more during the in order to demonstrate increased endurance to perform IADLs.  Baseline: 11/8: 1070 ft  Goal status: INITIAL  4.  Pt will increase Mini Best Balance test score by 4 points in order to demonstrate increased balance, so that the patient can safely play a full game of golf.  Baseline: 11/8:22 Goal status: INITIAL      ASSESSMENT:  CLINICAL IMPRESSION:  Pt tolerated all tasks during today's visit. Pt was able to complete forward and reverse lunges with 5# dumbbells and hip abductions/extensions with 4# AW. Pt was also able to navigate the obstacle course with an uneven red mat this session without any LOB. Pt stated that he is looking forward to his progress note session next visit. Pt will continue to benefit from skilled physical therapy intervention to address impairments, improve QOL, and attain therapy goals.    OBJECTIVE IMPAIRMENTS: decreased endurance.   ACTIVITY LIMITATIONS:  N/A  PARTICIPATION LIMITATIONS:  N/A  PERSONAL FACTORS: 1 comorbidity: pancreatitis  are also affecting patient's functional outcome.   REHAB POTENTIAL: Good  CLINICAL DECISION MAKING: Evolving/moderate complexity  EVALUATION COMPLEXITY: Low  PLAN:  PT FREQUENCY: 2x/week  PT DURATION: 12 weeks  PLANNED INTERVENTIONS: 97110-Therapeutic exercises, 97530- Therapeutic  activity, [id number]- Neuromuscular re-education, 97535- Self Care, and [id number]- Manual  therapy  PLAN FOR NEXT SESSION: endurance, dynamic balance program, progress HEP as indicated.    Lenda Kelp, PT 06/09/2023, 1:23 PM  Debara Pickett, SPT  This entire session was performed under the direct supervision of a licensed physical therapist. I have reviewed and agree with student's findings and recommendations.   Lenda Kelp PT  Physical Therapist- [facility]

## 2023-06-10 ENCOUNTER — Encounter: Payer: HMO | Admitting: Speech Pathology

## 2023-06-10 ENCOUNTER — Ambulatory Visit: Payer: HMO | Admitting: Physical Therapy

## 2023-06-14 ENCOUNTER — Encounter: Payer: Self-pay | Admitting: Physical Therapy

## 2023-06-14 ENCOUNTER — Ambulatory Visit: Payer: HMO | Admitting: Physical Therapy

## 2023-06-14 ENCOUNTER — Ambulatory Visit: Payer: HMO | Admitting: Speech Pathology

## 2023-06-14 DIAGNOSIS — R2689 Other abnormalities of gait and mobility: Secondary | ICD-10-CM

## 2023-06-14 DIAGNOSIS — R262 Difficulty in walking, not elsewhere classified: Secondary | ICD-10-CM

## 2023-06-14 DIAGNOSIS — R269 Unspecified abnormalities of gait and mobility: Secondary | ICD-10-CM

## 2023-06-14 DIAGNOSIS — R490 Dysphonia: Secondary | ICD-10-CM

## 2023-06-14 DIAGNOSIS — M6281 Muscle weakness (generalized): Secondary | ICD-10-CM

## 2023-06-14 NOTE — Therapy (Signed)
OUTPATIENT PHYSICAL THERAPY TREATMENT/Physical Therapy Progress Note   Dates of reporting period  05/10/2023   to   06/14/2023    Patient Name: Jon Bray MRN: [id number] DOB:[birth year], 82 y.o., male Today's Date: 06/14/2023   PCP: Danella Penton, MD REFERRING PROVIDER: Danella Penton, MD  END OF SESSION:  PT End of Session - 06/14/23 0932     Visit Number 10    Number of Visits 24    Date for PT Re-Evaluation 08/02/23    Progress Note Due on Visit 20    PT Start Time 0932    PT Stop Time 1015    PT Time Calculation (min) 43 min    Equipment Utilized During Treatment Gait belt    Activity Tolerance Patient tolerated treatment well    Behavior During Therapy WFL for tasks assessed/performed             Past Medical History:  Diagnosis Date   Anginal pain (HCC)    Aortic atherosclerosis (HCC)    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   Basal cell carcinoma of skin    a.) s/p Mohs procedure R/L nasal ala 01/27/2015   CAD (coronary artery disease) 05/12/2013   a.) STEMI --> LHC 05/12/2013: 20% p-mLAD, 20% D2, 40% LPDA, 30% LPAV, 99% dLCx --> PCI placing 4.5 x 20 mm and 2.5 x 12 mm Veriflex BMS to LPAV extending to dLCx. b.) cCTA 12/27/2017: Ca score 1578; 82nd percentile for age/sex match control; CT-FFR: RCA 0.99, pLAD 0.97, mLAD 0.98, dLAD 0.89, pLCx 0.98, mLCx 0.97, dLCx 0.97, PLB 0.91, PDA (most distal segment) 0.80.   Cataract    a.) s/p extraction with IOL placement   CKD (chronic kidney disease)    Diverticulosis    GERD (gastroesophageal reflux disease)    History of kidney stones 2023   HLD (hyperlipidemia)    Left lower lobe pulmonary nodule 08/01/2021   a.) CT 08/01/2021: measured 4 mm; perifissural   Long term current use of anticoagulant    a.) apixaban   NSVT (nonsustained ventricular tachycardia) (HCC) 05/13/2013   a.) 24 beat run while in ICU following PCI for STEMI   PAF (paroxysmal atrial  fibrillation) (HCC) 11/16/2021   a.) new onset in ED on 11/16/2021. b.) CHA2DS2-VASc = 4 (age x 2, STEMI/aortic plaque, T2DM). c.) rate controlled without pharmacological intervention; started on apixaban 11/16/2021, however PCP discontinued on 11/18/2021 pending Holter study results.   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   Type 2 diabetes, diet controlled [facility])    Past Surgical History:  Procedure Laterality Date   BIOPSY  12/02/2022   Procedure: BIOPSY;  Surgeon: Meridee Score Netty Starring., MD;  Location: [facility] ENDOSCOPY;  Service: Gastroenterology;;   BIOPSY  01/31/2023   Procedure: BIOPSY;  Surgeon: Imogene Burn, MD;  Location: [facility] ENDOSCOPY;  Service: Gastroenterology;;   CATARACT EXTRACTION W/ INTRAOCULAR LENS IMPLANT Bilateral    CHOLECYSTECTOMY N/A 12/03/2022   Procedure: LAPAROSCOPIC CHOLECYSTECTOMY;  Surgeon: [name], MD;  Location: [facility] OR;  Service: General;  Laterality: N/A;   COLONOSCOPY N/A 01/31/2001   COLONOSCOPY N/A 11/19/2008   CORONARY ANGIOPLASTY WITH STENT PLACEMENT Left 05/12/2013   Procedure: CORONARY ANGIOPLASTY WITH  STENT PLACEMENT; Location: [facility]; Surgeon: Lieutenant Diego, MD   ENDOSCOPIC RETROGRADE CHOLANGIOPANCREATOGRAPHY (ERCP) WITH PROPOFOL N/A 11/22/2022   Procedure: ENDOSCOPIC RETROGRADE CHOLANGIOPANCREATOGRAPHY (ERCP) WITH PROPOFOL;  Surgeon: Iva Boop, MD;  Location: [facility] ENDOSCOPY;  Service: Gastroenterology;  Laterality: N/A;   ERCP N/A 12/02/2022   Procedure: ENDOSCOPIC RETROGRADE CHOLANGIOPANCREATOGRAPHY (ERCP);  Surgeon: [name]., MD;  Location: [facility] ENDOSCOPY;  Service: Gastroenterology;  Laterality: N/A;   ESOPHAGOGASTRODUODENOSCOPY N/A 03/03/2000   ESOPHAGOGASTRODUODENOSCOPY (EGD) WITH PROPOFOL N/A 10/16/2021    Procedure: ESOPHAGOGASTRODUODENOSCOPY (EGD) WITH PROPOFOL;  Surgeon: Regis Bill, MD;  Location: [facility] ENDOSCOPY;  Service: Endoscopy;  Laterality: N/A;   ESOPHAGOGASTRODUODENOSCOPY (EGD) WITH PROPOFOL N/A 01/31/2023   Procedure: ESOPHAGOGASTRODUODENOSCOPY (EGD) WITH PROPOFOL;  Surgeon: Imogene Burn, MD;  Location[facility] ENDOSCOPY;  Service: Gastroenterology;  Laterality: N/A;   ESOPHAGOGASTRODUODENOSCOPY (EGD) WITH PROPOFOL N/A 02/16/2023   Procedure: ESOPHAGOGASTRODUODENOSCOPY (EGD) WITH PROPOFOL;  Surgeon: Meridee Score Netty Starring., MD;  Location: [facility] ENDOSCOPY;  Service: Gastroenterology;  Laterality: N/A;   EUS N/A 02/16/2023   Procedure: UPPER ENDOSCOPIC ULTRASOUND (EUS) LINEAR;  Surgeon: [name]., MD;  Location: [facility] ENDOSCOPY;  Service: Gastroenterology;  Laterality: N/A;   EXTRACORPOREAL SHOCK WAVE LITHOTRIPSY Left 08/06/2021   Procedure: EXTRACORPOREAL SHOCK WAVE LITHOTRIPSY (ESWL);  Surgeon: Vanna Scotland, MD;  Location: [facility] ORS;  Service: Urology;  Laterality: Left;   INGUINAL HERNIA REPAIR Right 11/27/2021   Procedure: HERNIA REPAIR INGUINAL ADULT;  Surgeon: Earline Mayotte, MD;  Location: [facility] ORS;  Service: General;  Laterality: Right;   INTRAOCULAR LENS EXCHANGE Left 12/27/2018   Procedure: MECHANICAL VITRECTOMY,  EXCHANGE LENS PROSTHESIS VIA PARS PLANA APPROACH; Location: [facility]; Surgeon: Nicholaus Corolla, MD   MOHS SURGERY Bilateral 01/27/2015   Procedure: MOHS SURGERY TO REMOVE BASAL CELL CARCINOMA FROM RIGHT/LEFT NASAL ALA; Location: [facility]; Surgeon: Katrine Coho, MD   REMOVAL OF STONES  12/02/2022   Procedure: REMOVAL OF STONES;  Surgeon: Meridee Score Netty Starring., MD;  Location: [facility] ENDOSCOPY;  Service: Gastroenterology;;   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   Procedure: RPR RETINAL DTCHMNT W/VITRECTOMY ANY METH REPAIR RETINAL DETACHMENT; WITH VITRECTOMY, INC, WHEN PERFORMED, AIR/GAS TAMPONADE, FOCAL ENDOLASER PHOTOCOAGULATION, CRYOTHERAPY, DRAINAGE OF SUBRETINAL  FLUID, SCLERAL BUCKLING AND/OR REMOVAL OF LENS; Location: [facility]; Surgeon: Nicholaus Corolla, MD   SINUSOTOMY N/A    Procedure: MAXILLARY SINUSOTOMY INTRANASAL   SPHINCTEROTOMY  12/02/2022   Procedure: SPHINCTEROTOMY;  Surgeon: Mansouraty, Netty Starring., MD;  Location: [facility] ENDOSCOPY;  Service: Gastroenterology;;   TONSILLECTOMY AND ADENOIDECTOMY N/A    Patient Active Problem List   Diagnosis Date Noted   Hypokalemia 02/16/2023   Gastric outflow obstruction 02/14/2023   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   Acute recurrent pancreatitis 02/13/2023   Intractable nausea and vomiting 02/13/2023   Displaced nasogastric feeding tube 02/12/2023   Esophageal stenosis 02/12/2023   Chronic pancreatitis (HCC) 02/12/2023   Leukocytosis 02/12/2023   Essential hypertension 02/12/2023   History of CAD (coronary artery disease) 02/12/2023   Atrial fibrillation, chronic -not on coagulation (HCC) 02/12/2023   Chronic disease anemia 02/12/2023   Encounter for feeding tube placement 02/12/2023   Malnutrition of moderate degree 02/01/2023   Nausea and vomiting 01/31/2023   Abnormal CT scan 01/31/2023   Acute pancreatitis 01/28/2023   Chronic diarrhea 12/05/2022   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   Hemorrhagic pancreatitis 11/30/2022   History of ERCP 11/30/2022   Biliary obstruction 11/30/2022   Moderate protein-calorie malnutrition (HCC) 11/30/2022   Gallstone pancreatitis 11/30/2022   Choledocholithiasis 11/21/2022   Acute gallstone pancreatitis 11/17/2022   PAF (paroxysmal  atrial fibrillation) (HCC) 11/16/2021   Diabetes type 2, controlled (HCC) 05/15/2018   Hyperlipidemia, mixed 10/28/2015   CAD (coronary artery disease) 05/12/2013   GERD (gastroesophageal reflux disease) 05/12/2013    ONSET DATE: 11/03/2022  REFERRING DIAG: R53.1 (ICD-10-CM) - Weakness   THERAPY DIAG:  Abnormality of gait and mobility  Muscle weakness (generalized)  Other abnormalities of gait and mobility  Difficulty in  walking, not elsewhere classified  Rationale for Evaluation and Treatment: Rehabilitation  SUBJECTIVE:                                                                                                                                                                                             SUBJECTIVE STATEMENT: Pt reported 0/10 on NPS and no falls or changes since last visit. Pt happy with his current course of PT and feels he has made a lot of improvements.   Pt accompanied by: self  PERTINENT HISTORY:  Pt stated that he was diagnosed with pancreatitis in May of 2024 and has been in and out of the hospital ever since. Pt stated that he lost 35 lbs in 2 weeks between May & June of 2024 and has had difficulty gaining weight since then. Pt stated that he has been utilizing a feeding tube for the past 45 days. Ever since diagnosis of pancreatitis, pt feels that he has lost muscle tone, muscle strength, and endurance. Pt stated that he goes to the gym 2 days a week. Pt has had PT at [facility] rehab, as well as home health and was told he needed PT that was more advanced. Pt feels like balance has been regressing ever since hospitalization in May. 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 PMH: Pancreatitis, CAD, GERD, Hyperlipidemia, Diabetes Type 2, Paroxysmal Atrial Fibrillation, Malnutrition, Hypertension     PAIN:  Are you having pain? No   PRECAUTIONS: None  RED FLAGS: None   WEIGHT BEARING RESTRICTIONS: No  FALLS: Has patient fallen in last 6 months? No  LIVING ENVIRONMENT: Lives with: lives with their family and lives with their spouse Lives in: House/apartment Stairs: Yes: External: 2 steps; on right going up Has following equipment at home: Single point cane  PLOF: Independent  PATIENT GOALS: Increase muscle strength, endurance, and get back to being able to [place]   OBJECTIVE:  Note: Objective measures were completed  at Evaluation unless otherwise noted.   LOWER EXTREMITY MMT:    MMT Right Eval Left Eval  Hip flexion 4 4-  Knee extension 4 4  Ankle dorsiflexion 4 4-  (  Blank rows = not tested)  BED MOBILITY:  WFL  FUNCTIONAL TESTS:  5 times sit to stand: 10 seconds 10 meter walk test: 6.16 seconds  PATIENT SURVEYS:  FOTO 56  TODAY'S TREATMENT:                                                                                                                              DATE:  06/14/23   Physical therapy treatment session today consisted of completing assessment of goals and administration of testing as demonstrated and documented in flow sheet, treatment, and goals section of this note. Addition treatments may be found below.     [facility] PT Assessment - 06/14/23 0001       Mini-BESTest   Sit To Stand Normal: Comes to stand without use of hands and stabilizes independently.    Rise to Toes Normal: Stable for 3 s with maximum height.    Stand on one leg (left) Normal: 20 s.    Stand on one leg (right) Normal: 20 s.    Stand on one leg - lowest score 2    Compensatory Stepping Correction - Forward Normal: Recovers independently with a single, large step (second realignement is allowed).    Compensatory Stepping Correction - Backward Normal: Recovers independently with a single, large step    Compensatory Stepping Correction - Left Lateral Normal: Recovers independently with 1 step (crossover or lateral OK)    Compensatory Stepping Correction - Right Lateral Normal: Recovers independently with 1 step (crossover or lateral OK)    Stepping Corredtion Lateral - lowest score 2    Stance - Feet together, eyes open, firm surface  Normal: 30s    Stance - Feet together, eyes closed, foam surface  Normal: 30s    Incline - Eyes Closed Normal: Stands independently 30s and aligns with gravity    Change in Gait Speed Normal: Significantly changes walkling speed without imbalance    Walk with head turns -  Horizontal Normal: performs head turns with no change in gait speed and good balance    Walk with pivot turns Normal: Turns with feet close FAST (< 3 steps) with good balance.    Step over obstacles Normal: Able to step over box with minimal change of gait speed and with good balance.    Timed UP & GO with Dual Task Normal: No noticeable change in sitting, standing or walking while backward counting when compared to TUG without   TUG: 7.31 Cognitive TUG: 8.25   Mini-BEST total score 28                  PATIENT EDUCATION: Education details: POC Person educated: Patient Education method: Explanation Education comprehension: verbalized understanding   HOME EXERCISE PROGRAM: Access Code: [email or web address] URL: [email or web address] Date: 05/13/2023 Prepared by: Thresa Ross  Exercises - Standing Single Leg Stance with Counter Support  - 1 x daily - 7 x weekly - 3 sets - 30 sec  hold - Standing Heel Raise  - 1 x daily - 7 x weekly - 2 sets - 10 reps - 3 second hold   GOALS: Goals reviewed with patient? No  SHORT TERM GOALS: Target date: 06/07/2023    Patient will be independent in home exercise program to improve strength/mobility for better functional independence with ADLs. Baseline: No HEP currently 12/10: Pt reported complying with HEP inconsistently   Goal status: PARTIALLY MET    LONG TERM GOALS: Target date: 08/02/2023    Pt will increase FOTO score to at least 65 in order to demonstrate increased independence with ADLs.  Baseline: 05/10/23: 56 12/10: 63 Goal status: PARTIALLY MET  2.  Pt will decrease 5xSTS by 2 seconds in order to demonstrate increased functional mobility strength in lower extremities.  Baseline: 05/10/23: 10 seconds 12/10: 9 seconds  Goal status: PARTIALLY  MET  3.  Pt will improve 200 feet or more during the in order to demonstrate increased endurance to perform IADLs.  Baseline: 11/8: 1070 ft  12/10: 1800 feet Goal  status: MET  4.  Pt will increase Mini Best Balance test score by 4 points in order to demonstrate increased balance, so that the patient can safely play a full game of golf.  Baseline: 11/8:22 12/10: 28 Goal status: MET      ASSESSMENT:  CLINICAL IMPRESSION:  Today's visit consisted of completing assessment of goals and administration of testing. Pt met the goals of increasing 6 minute walk test distance and Mini Best balance score as described above. Pt progressed in the goals involving decreasing 5xSTS time, increasing FOTO score, and becoming compliant with HEP. Due to the amount of progress measured during today's visit, pt and PT agreed to discharge next visit following education regarding gym program and advanced HEP for optimal improvement following discharge. Pt stated that he currently has a gym membership, so next visit will focus on establishing updated HEP for pt to comply with upon discharge.     OBJECTIVE IMPAIRMENTS: decreased endurance.   ACTIVITY LIMITATIONS:  N/A  PARTICIPATION LIMITATIONS:  N/A  PERSONAL FACTORS: 1 comorbidity: pancreatitis  are also affecting patient's functional outcome.   REHAB POTENTIAL: Good  CLINICAL DECISION MAKING: Evolving/moderate complexity  EVALUATION COMPLEXITY: Low  PLAN:  PT FREQUENCY: 2x/week  PT DURATION: 12 weeks  PLANNED INTERVENTIONS: 97110-Therapeutic exercises, 97530- Therapeutic activity, 97112- Neuromuscular re-education, 97535- Self Care, and [id number]- Manual therapy  PLAN FOR NEXT SESSION: Update HEP for pt to comply with for discharge next visit   Dezmen Alcock, Student-PT 06/14/2023, 10:22 AM  Debara Pickett, SPT  This entire session was performed under the direct supervision of a licensed physical therapist. I have reviewed and agree with student's findings and recommendations.   Norman Herrlich PT  Physical Therapist- [place]  [facility]

## 2023-06-14 NOTE — Therapy (Signed)
OUTPATIENT SPEECH LANGUAGE PATHOLOGY  VOICE TREATMENT NOTE   Patient Name: Jon Bray MRN: [id number] DOB:[birth year], 82 y.o., male Today's Date: 06/14/2023  PCP: Bethann Punches, MD REFERRING PROVIDER: Bud Face, MD   End of Session - 06/14/23 0844     Visit Number 7    Number of Visits 17    Date for SLP Re-Evaluation 07/13/23    Authorization Type [employer]    Progress Note Due on Visit 10    SLP Start Time 0845    SLP Stop Time  0930    SLP Time Calculation (min) 45 min    Activity Tolerance Patient tolerated treatment well             Past Medical History:  Diagnosis Date   Anginal pain (HCC)    Aortic atherosclerosis (HCC)    Aortic root enlargement (HCC) 12/27/2017   a.) cCTA 12/27/2017: measured 3.9 cm.   Basal cell carcinoma of skin    a.) s/p Mohs procedure R/L nasal ala 01/27/2015   CAD (coronary artery disease) 05/12/2013   a.) STEMI --> LHC 05/12/2013: 20% p-mLAD, 20% D2, 40% LPDA, 30% LPAV, 99% dLCx --> PCI placing 4.5 x 20 mm and 2.5 x 12 mm Veriflex BMS to LPAV extending to dLCx. b.) cCTA 12/27/2017: Ca score 1578; 82nd percentile for age/sex match control; CT-FFR: RCA 0.99, pLAD 0.97, mLAD 0.98, dLAD 0.89, pLCx 0.98, mLCx 0.97, dLCx 0.97, PLB 0.91, PDA (most distal segment) 0.80.   Cataract    a.) s/p extraction with IOL placement   CKD (chronic kidney disease)    Diverticulosis    GERD (gastroesophageal reflux disease)    History of kidney stones 2023   HLD (hyperlipidemia)    Left lower lobe pulmonary nodule 08/01/2021   a.) CT 08/01/2021: measured 4 mm; perifissural   Long term current use of anticoagulant    a.) apixaban   NSVT (nonsustained ventricular tachycardia) (HCC) 05/13/2013   a.) 24 beat run while in ICU following PCI for STEMI   PAF (paroxysmal atrial fibrillation) (HCC) 11/16/2021   a.) new onset in ED on 11/16/2021. b.) CHA2DS2-VASc = 4 (age x 2, STEMI/aortic plaque, T2DM). c.) rate controlled without  pharmacological intervention; started on apixaban 11/16/2021, however PCP discontinued on 11/18/2021 pending Holter study results.   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   Type 2 diabetes, diet controlled [facility])    Past Surgical History:  Procedure Laterality Date   BIOPSY  12/02/2022   Procedure: BIOPSY;  Surgeon: Meridee Score Netty Starring., MD;  Location: [facility] ENDOSCOPY;  Service: Gastroenterology;;   BIOPSY  01/31/2023   Procedure: BIOPSY;  Surgeon: Imogene Burn, MD;  Location: [facility] ENDOSCOPY;  Service: Gastroenterology;;   CATARACT EXTRACTION W/ INTRAOCULAR LENS IMPLANT Bilateral    CHOLECYSTECTOMY N/A 12/03/2022   Procedure: LAPAROSCOPIC CHOLECYSTECTOMY;  Surgeon: [name], MD;  Location: [facility] OR;  Service: General;  Laterality: N/A;   COLONOSCOPY N/A 01/31/2001   COLONOSCOPY N/A 11/19/2008   CORONARY ANGIOPLASTY WITH STENT PLACEMENT Left 05/12/2013   Procedure: CORONARY ANGIOPLASTY WITH STENT PLACEMENT; Location: [facility]; Surgeon: Lieutenant Diego, MD   ENDOSCOPIC RETROGRADE CHOLANGIOPANCREATOGRAPHY (ERCP) WITH PROPOFOL N/A 11/22/2022   Procedure: ENDOSCOPIC RETROGRADE CHOLANGIOPANCREATOGRAPHY (ERCP) WITH PROPOFOL;  Surgeon: Iva Boop,  MD;  Location: [facility] ENDOSCOPY;  Service: Gastroenterology;  Laterality: N/A;   ERCP N/A 12/02/2022   Procedure: ENDOSCOPIC RETROGRADE CHOLANGIOPANCREATOGRAPHY (ERCP);  Surgeon: [name]., MD;  Location: [facility] ENDOSCOPY;  Service: Gastroenterology;  Laterality: N/A;   ESOPHAGOGASTRODUODENOSCOPY N/A 03/03/2000   ESOPHAGOGASTRODUODENOSCOPY (EGD) WITH PROPOFOL N/A 10/16/2021   Procedure: ESOPHAGOGASTRODUODENOSCOPY (EGD) WITH PROPOFOL;  Surgeon: Regis Bill, MD;  Location: [facility] ENDOSCOPY;  Service: Endoscopy;  Laterality:  N/A;   ESOPHAGOGASTRODUODENOSCOPY (EGD) WITH PROPOFOL N/A 01/31/2023   Procedure: ESOPHAGOGASTRODUODENOSCOPY (EGD) WITH PROPOFOL;  Surgeon: Imogene Burn, MD;  Location[facility] ENDOSCOPY;  Service: Gastroenterology;  Laterality: N/A;   ESOPHAGOGASTRODUODENOSCOPY (EGD) WITH PROPOFOL N/A 02/16/2023   Procedure: ESOPHAGOGASTRODUODENOSCOPY (EGD) WITH PROPOFOL;  Surgeon: Meridee Score Netty Starring., MD;  Location: [facility] ENDOSCOPY;  Service: Gastroenterology;  Laterality: N/A;   EUS N/A 02/16/2023   Procedure: UPPER ENDOSCOPIC ULTRASOUND (EUS) LINEAR;  Surgeon: [name]., MD;  Location: [facility] ENDOSCOPY;  Service: Gastroenterology;  Laterality: N/A;   EXTRACORPOREAL SHOCK WAVE LITHOTRIPSY Left 08/06/2021   Procedure: EXTRACORPOREAL SHOCK WAVE LITHOTRIPSY (ESWL);  Surgeon: Vanna Scotland, MD;  Location: [facility] ORS;  Service: Urology;  Laterality: Left;   INGUINAL HERNIA REPAIR Right 11/27/2021   Procedure: HERNIA REPAIR INGUINAL ADULT;  Surgeon: Earline Mayotte, MD;  Location: [facility] ORS;  Service: General;  Laterality: Right;   INTRAOCULAR LENS EXCHANGE Left 12/27/2018   Procedure: MECHANICAL VITRECTOMY,  EXCHANGE LENS PROSTHESIS VIA PARS PLANA APPROACH; Location: [facility]; Surgeon: Nicholaus Corolla, MD   MOHS SURGERY Bilateral 01/27/2015   Procedure: MOHS SURGERY TO REMOVE BASAL CELL CARCINOMA FROM RIGHT/LEFT NASAL ALA; Location: [facility]; Surgeon: Katrine Coho, MD   REMOVAL OF STONES  12/02/2022   Procedure: REMOVAL OF STONES;  Surgeon: Meridee Score Netty Starring., MD;  Location: [facility] ENDOSCOPY;  Service: Gastroenterology;;   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   Procedure: RPR RETINAL DTCHMNT W/VITRECTOMY ANY METH REPAIR RETINAL DETACHMENT; WITH VITRECTOMY, INC, WHEN PERFORMED, AIR/GAS TAMPONADE, FOCAL ENDOLASER PHOTOCOAGULATION, CRYOTHERAPY, DRAINAGE OF SUBRETINAL FLUID, SCLERAL BUCKLING AND/OR REMOVAL OF LENS; Location: [facility]; Surgeon: Nicholaus Corolla, MD   SINUSOTOMY N/A    Procedure: MAXILLARY SINUSOTOMY INTRANASAL    SPHINCTEROTOMY  12/02/2022   Procedure: SPHINCTEROTOMY;  Surgeon: Mansouraty, Netty Starring., MD;  Location: [facility] ENDOSCOPY;  Service: Gastroenterology;;   TONSILLECTOMY AND ADENOIDECTOMY N/A    Patient Active Problem List   Diagnosis Date Noted   Hypokalemia 02/16/2023   Gastric outflow obstruction 02/14/2023   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   Acute recurrent pancreatitis 02/13/2023   Intractable nausea and vomiting 02/13/2023   Displaced nasogastric feeding tube 02/12/2023   Esophageal stenosis 02/12/2023   Chronic pancreatitis (HCC) 02/12/2023   Leukocytosis 02/12/2023   Essential hypertension 02/12/2023   History of CAD (coronary artery disease) 02/12/2023   Atrial fibrillation, chronic -not on coagulation (HCC) 02/12/2023   Chronic disease anemia 02/12/2023   Encounter for feeding tube placement 02/12/2023   Malnutrition of moderate degree 02/01/2023   Nausea and vomiting 01/31/2023   Abnormal CT scan 01/31/2023   Acute pancreatitis 01/28/2023   Chronic diarrhea 12/05/2022   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   Hemorrhagic pancreatitis 11/30/2022   History of ERCP 11/30/2022   Biliary obstruction 11/30/2022   Moderate protein-calorie malnutrition (HCC) 11/30/2022   Gallstone pancreatitis 11/30/2022   Choledocholithiasis 11/21/2022   Acute gallstone pancreatitis 11/17/2022   PAF (paroxysmal atrial fibrillation) (HCC) 11/16/2021   Diabetes type 2, controlled (HCC) 05/15/2018   Hyperlipidemia, mixed 10/28/2015   CAD (coronary artery disease) 05/12/2013   GERD (gastroesophageal reflux disease) 05/12/2013  ONSET DATE:  10 months ago following multiple surgeries   REFERRING DIAG: Paralysis of vocal cords and larynx, 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 Pt with PEG placement for pancreatitis.  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 Gradual improvement over time. CT was WNL. Pt reports had repeat laryngoscopy since my last visit and was told it was stronger. Mirror exam today shows good movement and patient's symptoms are improving. Recommend continue home speech exercises.   PAIN:  Are you having pain? No   FALLS: Has patient fallen in last 6 months? No,   LIVING ENVIRONMENT: Lives with: lives with their family Lives in: House/apartment  PLOF: Independent  PATIENT GOALS    to improve vocal quality  SUBJECTIVE STATEMENT: Improved phonation noted during initial conversation walking down the hallway  Pt accompanied by: self  OBJECTIVE:    TODAY'S TREATMENT SESSION:   Skilled treatment session focused on pt's dysphonia goals. SLP facilitated the session by providing the following interventions:  Expiratory Muscle Training Device using EMST 150 - 3 sets of 10 reps completed 60 cmH2O to achieve a self-reported effort level of 7 out of 10 - Min A cues required for quic bursts of air - information written down for pt   He reports increased practice as he put the sticky note reminder on his frig  Pt with initial difficulty performing loud AH with pushing. Prompts to relax and take a big breath were helpful in improving production - written reminders helpful - pt able to progress to counting, DOW, MOY as well as participate in conversation starters. During conversational starters, pt was asked to evaluate his speech strong vs weak. Pt able to do so effectively as well as rating served to improve his phonation.   PATIENT EDUCATION: Education details: see above Person educated: Patient Education method: Explanation Education comprehension: needs further education   HOME EXERCISE PROGRAM: Pushing during  phonation Increasing hydration EMST     GOALS: Goals reviewed with patient? Yes  SHORT TERM GOALS: Target date: 10 sessions  The patient will maximize voice quality and loudness using breath support/oral resonance for sustained vowel production, pitch glides, and hierarchal speech drill.  Baseline: Goal status: INITIAL  2.  The patient will demonstrate abdominal breathing patterns and steady release of breath on exhalation to optimize efficiency of voicing and decrease laryngeal hyperfunction.  Baseline:  Goal status: INITIAL  3.  The patient will increase hydration for an eventual goal of 6-8 glasses per day and limit caffeine intake (to maximum of 1-2, 8 oz cups/day), as measured by patient report.  Baseline:  Goal status: INITIAL  LONG TERM GOALS: Target date: 07/13/2023  The patient will participate in 5-8 minutes conversation, maintaining average loudness of 75 dB and loud, good quality voice with min cues.   Baseline:  Goal status: INITIAL  2.  The patient will be independent for abdominal breathing and breath support exercises.  Baseline:  Goal status: INITIAL  3.  Patient will report improved communication effectiveness as measured by PROM Baseline:  Goal status: INITIAL   ASSESSMENT:  CLINICAL IMPRESSION: Patient is a 82 y.o. 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   Pt's endurance progressed during today's session. Will continue to target respiratory strengthening and building endurance. See the above treatment note for details.   OBJECTIVE IMPAIRMENTS include voice disorder. These impairments are limiting patient from effectively communicating at home and in community. Factors affecting potential to achieve goals and functional outcome are  decreased PO consumption . Patient will benefit from  skilled SLP services to address above impairments and improve overall function.  REHAB POTENTIAL: Good  PLAN: SLP FREQUENCY: 1-2x/week  SLP DURATION: 8 weeks  PLANNED INTERVENTIONS: SLP instruction and feedback and Patient/family education  Josslynn Mentzer B. Dreama Saa, M.S., CCC-SLP, Tree surgeon Certified Brain Injury Specialist [facility]  [facility] Rehabilitation Services Office [phone] Ascom [phone] Fax [phone]

## 2023-06-15 ENCOUNTER — Ambulatory Visit: Payer: HMO | Admitting: Physical Therapy

## 2023-06-15 ENCOUNTER — Encounter: Payer: HMO | Admitting: Speech Pathology

## 2023-06-17 ENCOUNTER — Ambulatory Visit: Payer: HMO | Admitting: Speech Pathology

## 2023-06-17 ENCOUNTER — Encounter: Payer: Self-pay | Admitting: Physical Therapy

## 2023-06-17 ENCOUNTER — Ambulatory Visit: Payer: HMO | Admitting: Physical Therapy

## 2023-06-17 DIAGNOSIS — R269 Unspecified abnormalities of gait and mobility: Secondary | ICD-10-CM

## 2023-06-17 DIAGNOSIS — M6281 Muscle weakness (generalized): Secondary | ICD-10-CM

## 2023-06-17 DIAGNOSIS — R490 Dysphonia: Secondary | ICD-10-CM | POA: Diagnosis not present

## 2023-06-17 DIAGNOSIS — R2689 Other abnormalities of gait and mobility: Secondary | ICD-10-CM

## 2023-06-17 DIAGNOSIS — R262 Difficulty in walking, not elsewhere classified: Secondary | ICD-10-CM

## 2023-06-17 NOTE — Therapy (Signed)
OUTPATIENT PHYSICAL THERAPY DISCHARGE   Patient Name: Jon Bray MRN: [id number] DOB:1941/06/28, 82 y.o., male Today's Date: 06/17/2023   PCP: Danella Penton, MD REFERRING PROVIDER: Danella Penton, MD  END OF SESSION:  PT End of Session - 06/17/23 0917     Visit Number 11    Number of Visits 24    Date for PT Re-Evaluation 08/02/23    Progress Note Due on Visit 20    PT Start Time 0930    PT Stop Time 1014    PT Time Calculation (min) 44 min    Equipment Utilized During Treatment Gait belt    Activity Tolerance Patient tolerated treatment well    Behavior During Therapy WFL for tasks assessed/performed             Past Medical History:  Diagnosis Date   Anginal pain (HCC)    Aortic atherosclerosis (HCC)    Aortic root enlargement (HCC) 12/27/2017   a.) cCTA 12/27/2017: measured 3.9 cm.   Basal cell carcinoma of skin    a.) s/p Mohs procedure R/L nasal ala 01/27/2015   CAD (coronary artery disease) 05/12/2013   a.) STEMI --> LHC 05/12/2013: 20% p-mLAD, 20% D2, 40% LPDA, 30% LPAV, 99% dLCx --> PCI placing 4.5 x 20 mm and 2.5 x 12 mm Veriflex BMS to LPAV extending to dLCx. b.) cCTA 12/27/2017: Ca score 1578; 82nd percentile for age/sex match control; CT-FFR: RCA 0.99, pLAD 0.97, mLAD 0.98, dLAD 0.89, pLCx 0.98, mLCx 0.97, dLCx 0.97, PLB 0.91, PDA (most distal segment) 0.80.   Cataract    a.) s/p extraction with IOL placement   CKD (chronic kidney disease)    Diverticulosis    GERD (gastroesophageal reflux disease)    History of kidney stones 2023   HLD (hyperlipidemia)    Left lower lobe pulmonary nodule 08/01/2021   a.) CT 08/01/2021: measured 4 mm; perifissural   Long term current use of anticoagulant    a.) apixaban   NSVT (nonsustained ventricular tachycardia) (HCC) 05/13/2013   a.) 24 beat run while in ICU following PCI for STEMI   PAF (paroxysmal atrial fibrillation) (HCC) 11/16/2021   a.) new onset in ED on 11/16/2021. b.) CHA2DS2-VASc = 4 (age x 2,  STEMI/aortic plaque, T2DM). c.) rate controlled without pharmacological intervention; started on apixaban 11/16/2021, however PCP discontinued on 11/18/2021 pending Holter study results.   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   Type 2 diabetes, diet controlled [facility])    Past Surgical History:  Procedure Laterality Date   BIOPSY  12/02/2022   Procedure: BIOPSY;  Surgeon: Meridee Score Netty Starring., MD;  Location: [facility] ENDOSCOPY;  Service: Gastroenterology;;   BIOPSY  01/31/2023   Procedure: BIOPSY;  Surgeon: Imogene Burn, MD;  Location: [facility] ENDOSCOPY;  Service: Gastroenterology;;   CATARACT EXTRACTION W/ INTRAOCULAR LENS IMPLANT Bilateral    CHOLECYSTECTOMY N/A 12/03/2022   Procedure: LAPAROSCOPIC CHOLECYSTECTOMY;  Surgeon: [name], MD;  Location: [facility] OR;  Service: General;  Laterality: N/A;   COLONOSCOPY N/A 01/31/2001   COLONOSCOPY N/A 11/19/2008   CORONARY ANGIOPLASTY WITH STENT PLACEMENT Left 05/12/2013   Procedure: CORONARY ANGIOPLASTY WITH STENT PLACEMENT; Location: [facility]; Surgeon: Lieutenant Diego, MD   ENDOSCOPIC RETROGRADE CHOLANGIOPANCREATOGRAPHY (ERCP) WITH PROPOFOL N/A 11/22/2022  Procedure: ENDOSCOPIC RETROGRADE CHOLANGIOPANCREATOGRAPHY (ERCP) WITH PROPOFOL;  Surgeon: Iva Boop, MD;  Location: [facility] ENDOSCOPY;  Service: Gastroenterology;  Laterality: N/A;   ERCP N/A 12/02/2022   Procedure: ENDOSCOPIC RETROGRADE CHOLANGIOPANCREATOGRAPHY (ERCP);  Surgeon: [name]., MD;  Location: [facility] ENDOSCOPY;  Service: Gastroenterology;  Laterality: N/A;   ESOPHAGOGASTRODUODENOSCOPY N/A 03/03/2000   ESOPHAGOGASTRODUODENOSCOPY (EGD) WITH PROPOFOL N/A 10/16/2021   Procedure: ESOPHAGOGASTRODUODENOSCOPY (EGD) WITH PROPOFOL;  Surgeon: Regis Bill, MD;   Location: [facility] ENDOSCOPY;  Service: Endoscopy;  Laterality: N/A;   ESOPHAGOGASTRODUODENOSCOPY (EGD) WITH PROPOFOL N/A 01/31/2023   Procedure: ESOPHAGOGASTRODUODENOSCOPY (EGD) WITH PROPOFOL;  Surgeon: Imogene Burn, MD;  Location: [facility] ENDOSCOPY;  Service: Gastroenterology;  Laterality: N/A;   ESOPHAGOGASTRODUODENOSCOPY (EGD) WITH PROPOFOL N/A 02/16/2023   Procedure: ESOPHAGOGASTRODUODENOSCOPY (EGD) WITH PROPOFOL;  Surgeon: Meridee Score Netty Starring., MD;  Location: [facility] ENDOSCOPY;  Service: Gastroenterology;  Laterality: N/A;   EUS N/A 02/16/2023   Procedure: UPPER ENDOSCOPIC ULTRASOUND (EUS) LINEAR;  Surgeon: [name]., MD;  Location: [facility] ENDOSCOPY;  Service: Gastroenterology;  Laterality: N/A;   EXTRACORPOREAL SHOCK WAVE LITHOTRIPSY Left 08/06/2021   Procedure: EXTRACORPOREAL SHOCK WAVE LITHOTRIPSY (ESWL);  Surgeon: Vanna Scotland, MD;  Location: [facility] ORS;  Service: Urology;  Laterality: Left;   INGUINAL HERNIA REPAIR Right 11/27/2021   Procedure: HERNIA REPAIR INGUINAL ADULT;  Surgeon: Earline Mayotte, MD;  Location: [facility] ORS;  Service: General;  Laterality: Right;   INTRAOCULAR LENS EXCHANGE Left 12/27/2018   Procedure: MECHANICAL VITRECTOMY,  EXCHANGE LENS PROSTHESIS VIA PARS PLANA APPROACH; Location: [facility]; Surgeon: Nicholaus Corolla, MD   MOHS SURGERY Bilateral 01/27/2015   Procedure: MOHS SURGERY TO REMOVE BASAL CELL CARCINOMA FROM RIGHT/LEFT NASAL ALA; Location: [facility]; Surgeon: Katrine Coho, MD   REMOVAL OF STONES  12/02/2022   Procedure: REMOVAL OF STONES;  Surgeon: Meridee Score Netty Starring., MD;  Location: [facility] ENDOSCOPY;  Service: Gastroenterology;;   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   Procedure: RPR RETINAL DTCHMNT W/VITRECTOMY ANY METH REPAIR RETINAL DETACHMENT; WITH VITRECTOMY, INC, WHEN PERFORMED, AIR/GAS TAMPONADE, FOCAL ENDOLASER PHOTOCOAGULATION, CRYOTHERAPY, DRAINAGE OF SUBRETINAL FLUID, SCLERAL BUCKLING AND/OR REMOVAL OF LENS; Location: [facility]; Surgeon: Nicholaus Corolla, MD    SINUSOTOMY N/A    Procedure: MAXILLARY SINUSOTOMY INTRANASAL   SPHINCTEROTOMY  12/02/2022   Procedure: SPHINCTEROTOMY;  Surgeon: Mansouraty, Netty Starring., MD;  Location: [facility] ENDOSCOPY;  Service: Gastroenterology;;   TONSILLECTOMY AND ADENOIDECTOMY N/A    Patient Active Problem List   Diagnosis Date Noted   Hypokalemia 02/16/2023   Gastric outflow obstruction 02/14/2023   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   Acute recurrent pancreatitis 02/13/2023   Intractable nausea and vomiting 02/13/2023   Displaced nasogastric feeding tube 02/12/2023   Esophageal stenosis 02/12/2023   Chronic pancreatitis (HCC) 02/12/2023   Leukocytosis 02/12/2023   Essential hypertension 02/12/2023   History of CAD (coronary artery disease) 02/12/2023   Atrial fibrillation, chronic -not on coagulation (HCC) 02/12/2023   Chronic disease anemia 02/12/2023   Encounter for feeding tube placement 02/12/2023   Malnutrition of moderate degree 02/01/2023   Nausea and vomiting 01/31/2023   Abnormal CT scan 01/31/2023   Acute pancreatitis 01/28/2023   Chronic diarrhea 12/05/2022   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   Hemorrhagic pancreatitis 11/30/2022   History of ERCP 11/30/2022   Biliary obstruction 11/30/2022   Moderate protein-calorie malnutrition (HCC) 11/30/2022   Gallstone pancreatitis 11/30/2022   Choledocholithiasis 11/21/2022   Acute gallstone pancreatitis 11/17/2022   PAF (paroxysmal atrial fibrillation) (HCC) 11/16/2021   Diabetes type 2, controlled (HCC) 05/15/2018   Hyperlipidemia, mixed 10/28/2015   CAD (  coronary artery disease) 05/12/2013   GERD (gastroesophageal reflux disease) 05/12/2013    ONSET DATE: 11/03/2022  REFERRING DIAG: R53.1 (ICD-10-CM) - Weakness   THERAPY DIAG:  Abnormality of gait and mobility  Muscle weakness (generalized)  Other abnormalities of gait and mobility  Difficulty in walking, not elsewhere classified  Rationale for Evaluation and Treatment:  Rehabilitation  SUBJECTIVE:                                                                                                                                                                                             SUBJECTIVE STATEMENT: Pt reported 0/10 on NPS and no falls or changes since last visit.  Pt accompanied by: self  PERTINENT HISTORY:  Pt stated that he was diagnosed with pancreatitis in May of 2024 and has been in and out of the hospital ever since. Pt stated that he lost 35 lbs in 2 weeks between May & June of 2024 and has had difficulty gaining weight since then. Pt stated that he has been utilizing a feeding tube for the past 45 days. Ever since diagnosis of pancreatitis, pt feels that he has lost muscle tone, muscle strength, and endurance. Pt stated that he goes to the gym 2 days a week. Pt has had PT at [facility] rehab, as well as home health and was told he needed PT that was more advanced. Pt feels like balance has been regressing ever since hospitalization in May. 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 PMH: Pancreatitis, CAD, GERD, Hyperlipidemia, Diabetes Type 2, Paroxysmal Atrial Fibrillation, Malnutrition, Hypertension     PAIN:  Are you having pain? No   PRECAUTIONS: None  RED FLAGS: None   WEIGHT BEARING RESTRICTIONS: No  FALLS: Has patient fallen in last 6 months? No  LIVING ENVIRONMENT: Lives with: lives with their family and lives with their spouse Lives in: House/apartment Stairs: Yes: External: 2 steps; on right going up Has following equipment at home: Single point cane  PLOF: Independent  PATIENT GOALS: Increase muscle strength, endurance, and get back to being able to long drive [place] the golf [place]   OBJECTIVE:  Note: Objective measures were completed at Evaluation unless otherwise noted.   LOWER EXTREMITY MMT:    MMT Right Eval Left Eval  Hip flexion 4 4-  Knee extension 4 4  Ankle dorsiflexion 4 4-   (Blank rows = not tested)  BED MOBILITY:  WFL  FUNCTIONAL TESTS:  5 times sit to stand: 10 seconds 10 meter walk test: 6.16 seconds  PATIENT SURVEYS:  FOTO 56  TODAY'S TREATMENT:  DATE:  06/17/23  TE in [place] Oct[place]  LE only, Level 3 - 3 minutes, Level 5 - 3 minutes Leg Press Level 5 3x10  Leg extension Level 4 x 10 reps, Level 3 2x10 Hamstring curl Level 4 x 10 reps, Level 5 2x10  Chest Press with TRX Bands 3x10  Lat pulldowns Level 3 2x10  NMR  SLS 3x30 seconds each LE Single leg heel raises 2x10 each LE     PATIENT EDUCATION: Education details: POC Person educated: Patient Education method: Explanation Education comprehension: verbalized understanding   HOME EXERCISE PROGRAM: Access Code: [email or web address] URL: [email or web address] Date: 05/13/2023 Prepared by: Thresa Ross  Exercises - Standing Single Leg Stance with Counter Support  - 1 x daily - 7 x weekly - 3 sets - 30 sec  hold - Standing Heel Raise  - 1 x daily - 7 x weekly - 2 sets - 10 reps - 3 second hold   GOALS: Goals reviewed with patient? No  SHORT TERM GOALS: Target date: 06/07/2023    Patient will be independent in home exercise program to improve strength/mobility for better functional independence with ADLs. Baseline: No HEP currently 12/10: Pt reported complying with HEP inconsistently   Goal status: PARTIALLY MET    LONG TERM GOALS: Target date: 08/02/2023    Pt will increase FOTO score to at least 65 in order to demonstrate increased independence with ADLs.  Baseline: 05/10/23: 56 12/10: 63 Goal status: PARTIALLY MET  2.  Pt will decrease 5xSTS by 2 seconds in order to demonstrate increased functional mobility strength in lower extremities.  Baseline: 05/10/23: 10 seconds 12/10: 9 seconds  Goal status: PARTIALLY  MET  3.  Pt will improve  200 feet or more during the in order to demonstrate increased endurance to perform IADLs.  Baseline: 11/8: 1070 ft  12/10: 1800 feet Goal status: MET  4.  Pt will increase Mini Best Balance test score by 4 points in order to demonstrate increased balance, so that the patient can safely play a full game of golf.  Baseline: 11/8:22 12/10: 28 Goal status: MET      ASSESSMENT:  CLINICAL IMPRESSION:  Today's discharge visit consisted of reviewing exercises the pt can do at the gym with his gym membership, as well as NMR tasks the pt can do at home. Pt was able to demonstrate efficiency in completing therex tasks in the gym, as well as NMR tasks at home. Pt stated that he feels that he has made a lot of progress since the beginning of PT and feels ready to get back playing golf once the weather is tolerable. All questions addressed and pt pleased with care, pt to be discharged with formal Hep at this time.     OBJECTIVE IMPAIRMENTS: decreased endurance.   ACTIVITY LIMITATIONS:  N/A  PARTICIPATION LIMITATIONS:  N/A  PERSONAL FACTORS: 1 comorbidity: pancreatitis  are also affecting patient's functional outcome.   REHAB POTENTIAL: Good  CLINICAL DECISION MAKING: Evolving/moderate complexity  EVALUATION COMPLEXITY: Low  PLAN:  PT FREQUENCY: 2x/week  PT DURATION: 12 weeks  PLANNED INTERVENTIONS: 97110-Therapeutic exercises, 97530- Therapeutic activity, [id number]- Neuromuscular re-education, 97535- Self Care, and [id number]- Manual therapy  PLAN FOR NEXT SESSION: Discharged this visit   Debara Pickett, Student-PT 06/17/2023, 10:23 AM  This entire session was performed under the direct supervision of a licensed physical therapist. I have reviewed and agree with student's findings and recommendations.   This entire session was performed under direct supervision and direction of a  licensed Estate agent . I have personally read, edited and approve of the note as  written.    This licensed clinician was present and actively directing care throughout the session at all times.  Norman Herrlich PT ,DPT Physical Therapist- [place]  [facility]

## 2023-06-17 NOTE — Therapy (Signed)
OUTPATIENT SPEECH LANGUAGE PATHOLOGY  VOICE TREATMENT NOTE   Patient Name: Jon Bray MRN: [id number] DOB:Nov 09, 1940, 82 y.o., male Today's Date: 06/17/2023  PCP: Bethann Punches, MD REFERRING PROVIDER: Bud Face, MD   End of Session - 06/17/23 0850     Visit Number 8    Number of Visits 17    Date for SLP Re-Evaluation 07/13/23    Authorization Type [employer]    Progress Note Due on Visit 10    SLP Start Time 0845    SLP Stop Time  0930    SLP Time Calculation (min) 45 min    Activity Tolerance Patient tolerated treatment well             Past Medical History:  Diagnosis Date   Anginal pain (HCC)    Aortic atherosclerosis (HCC)    Aortic root enlargement (HCC) 12/27/2017   a.) cCTA 12/27/2017: measured 3.9 cm.   Basal cell carcinoma of skin    a.) s/p Mohs procedure R/L nasal ala 01/27/2015   CAD (coronary artery disease) 05/12/2013   a.) STEMI --> LHC 05/12/2013: 20% p-mLAD, 20% D2, 40% LPDA, 30% LPAV, 99% dLCx --> PCI placing 4.5 x 20 mm and 2.5 x 12 mm Veriflex BMS to LPAV extending to dLCx. b.) cCTA 12/27/2017: Ca score 1578; 82nd percentile for age/sex match control; CT-FFR: RCA 0.99, pLAD 0.97, mLAD 0.98, dLAD 0.89, pLCx 0.98, mLCx 0.97, dLCx 0.97, PLB 0.91, PDA (most distal segment) 0.80.   Cataract    a.) s/p extraction with IOL placement   CKD (chronic kidney disease)    Diverticulosis    GERD (gastroesophageal reflux disease)    History of kidney stones 2023   HLD (hyperlipidemia)    Left lower lobe pulmonary nodule 08/01/2021   a.) CT 08/01/2021: measured 4 mm; perifissural   Long term current use of anticoagulant    a.) apixaban   NSVT (nonsustained ventricular tachycardia) (HCC) 05/13/2013   a.) 24 beat run while in ICU following PCI for STEMI   PAF (paroxysmal atrial fibrillation) (HCC) 11/16/2021   a.) new onset in ED on 11/16/2021. b.) CHA2DS2-VASc = 4 (age x 2, STEMI/aortic plaque, T2DM). c.) rate controlled without  pharmacological intervention; started on apixaban 11/16/2021, however PCP discontinued on 11/18/2021 pending Holter study results.   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   Type 2 diabetes, diet controlled [facility])    Past Surgical History:  Procedure Laterality Date   BIOPSY  12/02/2022   Procedure: BIOPSY;  Surgeon: Meridee Score Netty Starring., MD;  Location: [facility] ENDOSCOPY;  Service: Gastroenterology;;   BIOPSY  01/31/2023   Procedure: BIOPSY;  Surgeon: Imogene Burn, MD;  Location: [facility] ENDOSCOPY;  Service: Gastroenterology;;   CATARACT EXTRACTION W/ INTRAOCULAR LENS IMPLANT Bilateral    CHOLECYSTECTOMY N/A 12/03/2022   Procedure: LAPAROSCOPIC CHOLECYSTECTOMY;  Surgeon: [name], MD;  Location: [facility] OR;  Service: General;  Laterality: N/A;   COLONOSCOPY N/A 01/31/2001   COLONOSCOPY N/A 11/19/2008   CORONARY ANGIOPLASTY WITH STENT PLACEMENT Left 05/12/2013   Procedure: CORONARY ANGIOPLASTY WITH STENT PLACEMENT; Location: [facility]; Surgeon: Lieutenant Diego, MD   ENDOSCOPIC RETROGRADE CHOLANGIOPANCREATOGRAPHY (ERCP) WITH PROPOFOL N/A 11/22/2022   Procedure: ENDOSCOPIC RETROGRADE CHOLANGIOPANCREATOGRAPHY (ERCP) WITH PROPOFOL;  Surgeon: Iva Boop,  MD;  Location: [facility] ENDOSCOPY;  Service: Gastroenterology;  Laterality: N/A;   ERCP N/A 12/02/2022   Procedure: ENDOSCOPIC RETROGRADE CHOLANGIOPANCREATOGRAPHY (ERCP);  Surgeon: [name]., MD;  Location: [facility] ENDOSCOPY;  Service: Gastroenterology;  Laterality: N/A;   ESOPHAGOGASTRODUODENOSCOPY N/A 03/03/2000   ESOPHAGOGASTRODUODENOSCOPY (EGD) WITH PROPOFOL N/A 10/16/2021   Procedure: ESOPHAGOGASTRODUODENOSCOPY (EGD) WITH PROPOFOL;  Surgeon: Regis Bill, MD;  Location: [facility] ENDOSCOPY;  Service: Endoscopy;  Laterality:  N/A;   ESOPHAGOGASTRODUODENOSCOPY (EGD) WITH PROPOFOL N/A 01/31/2023   Procedure: ESOPHAGOGASTRODUODENOSCOPY (EGD) WITH PROPOFOL;  Surgeon: Imogene Burn, MD;  Location: [facility] ENDOSCOPY;  Service: Gastroenterology;  Laterality: N/A;   ESOPHAGOGASTRODUODENOSCOPY (EGD) WITH PROPOFOL N/A 02/16/2023   Procedure: ESOPHAGOGASTRODUODENOSCOPY (EGD) WITH PROPOFOL;  Surgeon: Meridee Score Netty Starring., MD;  Location: [facility] ENDOSCOPY;  Service: Gastroenterology;  Laterality: N/A;   EUS N/A 02/16/2023   Procedure: UPPER ENDOSCOPIC ULTRASOUND (EUS) LINEAR;  Surgeon: [name]., MD;  Location: [facility] ENDOSCOPY;  Service: Gastroenterology;  Laterality: N/A;   EXTRACORPOREAL SHOCK WAVE LITHOTRIPSY Left 08/06/2021   Procedure: EXTRACORPOREAL SHOCK WAVE LITHOTRIPSY (ESWL);  Surgeon: Vanna Scotland, MD;  Location: [facility] ORS;  Service: Urology;  Laterality: Left;   INGUINAL HERNIA REPAIR Right 11/27/2021   Procedure: HERNIA REPAIR INGUINAL ADULT;  Surgeon: Earline Mayotte, MD;  Location: [facility] ORS;  Service: General;  Laterality: Right;   INTRAOCULAR LENS EXCHANGE Left 12/27/2018   Procedure: MECHANICAL VITRECTOMY,  EXCHANGE LENS PROSTHESIS VIA PARS PLANA APPROACH; Location: [facility]; Surgeon: Nicholaus Corolla, MD   MOHS SURGERY Bilateral 01/27/2015   Procedure: MOHS SURGERY TO REMOVE BASAL CELL CARCINOMA FROM RIGHT/LEFT NASAL ALA; Location: [facility]; Surgeon: Katrine Coho, MD   REMOVAL OF STONES  12/02/2022   Procedure: REMOVAL OF STONES;  Surgeon: Meridee Score Netty Starring., MD;  Location: [facility] ENDOSCOPY;  Service: Gastroenterology;;   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   Procedure: RPR RETINAL DTCHMNT W/VITRECTOMY ANY METH REPAIR RETINAL DETACHMENT; WITH VITRECTOMY, INC, WHEN PERFORMED, AIR/GAS TAMPONADE, FOCAL ENDOLASER PHOTOCOAGULATION, CRYOTHERAPY, DRAINAGE OF SUBRETINAL FLUID, SCLERAL BUCKLING AND/OR REMOVAL OF LENS; Location: [facility]; Surgeon: Nicholaus Corolla, MD   SINUSOTOMY N/A    Procedure: MAXILLARY SINUSOTOMY INTRANASAL    SPHINCTEROTOMY  12/02/2022   Procedure: SPHINCTEROTOMY;  Surgeon: Mansouraty, Netty Starring., MD;  Location: [facility] ENDOSCOPY;  Service: Gastroenterology;;   TONSILLECTOMY AND ADENOIDECTOMY N/A    Patient Active Problem List   Diagnosis Date Noted   Hypokalemia 02/16/2023   Gastric outflow obstruction 02/14/2023   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   Acute recurrent pancreatitis 02/13/2023   Intractable nausea and vomiting 02/13/2023   Displaced nasogastric feeding tube 02/12/2023   Esophageal stenosis 02/12/2023   Chronic pancreatitis (HCC) 02/12/2023   Leukocytosis 02/12/2023   Essential hypertension 02/12/2023   History of CAD (coronary artery disease) 02/12/2023   Atrial fibrillation, chronic -not on coagulation (HCC) 02/12/2023   Chronic disease anemia 02/12/2023   Encounter for feeding tube placement 02/12/2023   Malnutrition of moderate degree 02/01/2023   Nausea and vomiting 01/31/2023   Abnormal CT scan 01/31/2023   Acute pancreatitis 01/28/2023   Chronic diarrhea 12/05/2022   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   Hemorrhagic pancreatitis 11/30/2022   History of ERCP 11/30/2022   Biliary obstruction 11/30/2022   Moderate protein-calorie malnutrition (HCC) 11/30/2022   Gallstone pancreatitis 11/30/2022   Choledocholithiasis 11/21/2022   Acute gallstone pancreatitis 11/17/2022   PAF (paroxysmal atrial fibrillation) (HCC) 11/16/2021   Diabetes type 2, controlled (HCC) 05/15/2018   Hyperlipidemia, mixed 10/28/2015   CAD (coronary artery disease) 05/12/2013   GERD (gastroesophageal reflux disease) 05/12/2013  ONSET DATE:  10 months ago following multiple surgeries   REFERRING DIAG: Paralysis of vocal cords and larynx, 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 Pt with PEG placement for pancreatitis.  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 Gradual improvement over time. CT was WNL. Pt reports had repeat laryngoscopy since my last visit and was told it was stronger. Mirror exam today shows good movement and patient's symptoms are improving. Recommend continue home speech exercises.   PAIN:  Are you having pain? No   FALLS: Has patient fallen in last 6 months? No,   LIVING ENVIRONMENT: Lives with: lives with their family Lives in: House/apartment  PLOF: Independent  PATIENT GOALS    to improve vocal quality  SUBJECTIVE STATEMENT: "We went out to eat last night, and I got hoarse" Pt accompanied by: self  OBJECTIVE:    TODAY'S TREATMENT SESSION:   Skilled treatment session focused on pt's dysphonia goals. SLP facilitated the session by providing the following interventions:  Expiratory Muscle Training Device using EMST 150 - 3 sets of 10 reps completed 60 cmH2O to achieve a self-reported effort level of 7 out of 10 - increased to 75 cmH2O   Pt with initial difficulty performing loud AH with pushing. 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 Great self-awareness, and report of decreased effort level when producing.   Reading and responding to [facility] cards, pt benefited from rare Min A to increase vocal intensity.    PATIENT EDUCATION: Education details: see above Person educated: Patient Education method: Explanation Education comprehension: needs further education   HOME EXERCISE PROGRAM: Pushing during phonation Increasing hydration EMST     GOALS: Goals reviewed with patient? Yes  SHORT TERM GOALS: Target date: 10 sessions  The patient will maximize voice quality and loudness using breath support/oral resonance for sustained vowel production, pitch  glides, and hierarchal speech drill.  Baseline: Goal status: INITIAL  2.  The patient will demonstrate abdominal breathing patterns and steady release of breath on exhalation to optimize efficiency of voicing and decrease laryngeal hyperfunction.  Baseline:  Goal status: INITIAL  3.  The patient will increase hydration for an eventual goal of 6-8 glasses per day and limit caffeine intake (to maximum of 1-2, 8 oz cups/day), as measured by patient report.  Baseline:  Goal status: INITIAL  LONG TERM GOALS: Target date: 07/13/2023  The patient will participate in 5-8 minutes conversation, maintaining average loudness of 75 dB and loud, good quality voice with min cues.   Baseline:  Goal status: INITIAL  2.  The patient will be independent for abdominal breathing and breath support exercises.  Baseline:  Goal status: INITIAL  3.  Patient will report improved communication effectiveness as measured by PROM Baseline:  Goal status: INITIAL   ASSESSMENT:  CLINICAL IMPRESSION: Patient is a 82 y.o. 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   Pt's endurance progressed during today's session. Will continue to target respiratory strengthening and building endurance. See the above treatment note for details.   OBJECTIVE  IMPAIRMENTS include voice disorder. These impairments are limiting patient from effectively communicating at home and in community. Factors affecting potential to achieve goals and functional outcome are  decreased PO consumption . Patient will benefit from skilled SLP services to address above impairments and improve overall function.  REHAB POTENTIAL: Good  PLAN: SLP FREQUENCY: 1-2x/week  SLP DURATION: 8 weeks  PLANNED INTERVENTIONS: SLP instruction and feedback and Patient/family education  Ancel Easler B.  Dreama Saa, M.S., CCC-SLP, Tree surgeon Certified Brain Injury Specialist [facility]  [facility] Rehabilitation Services Office [phone] Ascom [phone] Fax [phone]

## 2023-06-21 ENCOUNTER — Ambulatory Visit: Payer: HMO | Admitting: Physical Therapy

## 2023-06-21 ENCOUNTER — Ambulatory Visit: Payer: HMO | Admitting: Speech Pathology

## 2023-06-21 DIAGNOSIS — Z2821 Immunization not carried out because of patient refusal: Secondary | ICD-10-CM | POA: Diagnosis not present

## 2023-06-21 DIAGNOSIS — E1151 Type 2 diabetes mellitus with diabetic peripheral angiopathy without gangrene: Secondary | ICD-10-CM | POA: Diagnosis not present

## 2023-06-21 DIAGNOSIS — R490 Dysphonia: Secondary | ICD-10-CM | POA: Diagnosis not present

## 2023-06-21 DIAGNOSIS — E440 Moderate protein-calorie malnutrition: Secondary | ICD-10-CM | POA: Diagnosis not present

## 2023-06-21 NOTE — Therapy (Signed)
OUTPATIENT SPEECH LANGUAGE PATHOLOGY  VOICE TREATMENT NOTE   Patient Name: Jon Bray MRN: [id number] DOB:06-22-1941, 82 y.o., male Today's Date: 06/21/2023  PCP: Bethann Punches, MD REFERRING PROVIDER: Bud Face, MD   End of Session - 06/21/23 1144     Visit Number 9    Number of Visits 17    Date for SLP Re-Evaluation 07/13/23    Authorization Type [employer]    Progress Note Due on Visit 10    SLP Start Time 1145    SLP Stop Time  1230    SLP Time Calculation (min) 45 min    Activity Tolerance Patient tolerated treatment well             Past Medical History:  Diagnosis Date   Anginal pain (HCC)    Aortic atherosclerosis (HCC)    Aortic root enlargement (HCC) 12/27/2017   a.) cCTA 12/27/2017: measured 3.9 cm.   Basal cell carcinoma of skin    a.) s/p Mohs procedure R/L nasal ala 01/27/2015   CAD (coronary artery disease) 05/12/2013   a.) STEMI --> LHC 05/12/2013: 20% p-mLAD, 20% D2, 40% LPDA, 30% LPAV, 99% dLCx --> PCI placing 4.5 x 20 mm and 2.5 x 12 mm Veriflex BMS to LPAV extending to dLCx. b.) cCTA 12/27/2017: Ca score 1578; 82nd percentile for age/sex match control; CT-FFR: RCA 0.99, pLAD 0.97, mLAD 0.98, dLAD 0.89, pLCx 0.98, mLCx 0.97, dLCx 0.97, PLB 0.91, PDA (most distal segment) 0.80.   Cataract    a.) s/p extraction with IOL placement   CKD (chronic kidney disease)    Diverticulosis    GERD (gastroesophageal reflux disease)    History of kidney stones 2023   HLD (hyperlipidemia)    Left lower lobe pulmonary nodule 08/01/2021   a.) CT 08/01/2021: measured 4 mm; perifissural   Long term current use of anticoagulant    a.) apixaban   NSVT (nonsustained ventricular tachycardia) (HCC) 05/13/2013   a.) 24 beat run while in ICU following PCI for STEMI   PAF (paroxysmal atrial fibrillation) (HCC) 11/16/2021   a.) new onset in ED on 11/16/2021. b.) CHA2DS2-VASc = 4 (age x 2, STEMI/aortic plaque, T2DM). c.) rate controlled without  pharmacological intervention; started on apixaban 11/16/2021, however PCP discontinued on 11/18/2021 pending Holter study results.   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   Type 2 diabetes, diet controlled [facility])    Past Surgical History:  Procedure Laterality Date   BIOPSY  12/02/2022   Procedure: BIOPSY;  Surgeon: Meridee Score Netty Starring., MD;  Location: [facility] ENDOSCOPY;  Service: Gastroenterology;;   BIOPSY  01/31/2023   Procedure: BIOPSY;  Surgeon: Imogene Burn, MD;  Location: [facility] ENDOSCOPY;  Service: Gastroenterology;;   CATARACT EXTRACTION W/ INTRAOCULAR LENS IMPLANT Bilateral    CHOLECYSTECTOMY N/A 12/03/2022   Procedure: LAPAROSCOPIC CHOLECYSTECTOMY;  Surgeon: [name], MD;  Location: [facility] OR;  Service: General;  Laterality: N/A;   COLONOSCOPY N/A 01/31/2001   COLONOSCOPY N/A 11/19/2008   CORONARY ANGIOPLASTY WITH STENT PLACEMENT Left 05/12/2013   Procedure: CORONARY ANGIOPLASTY WITH STENT PLACEMENT; Location: [facility]; Surgeon: Lieutenant Diego, MD   ENDOSCOPIC RETROGRADE CHOLANGIOPANCREATOGRAPHY (ERCP) WITH PROPOFOL N/A 11/22/2022   Procedure: ENDOSCOPIC RETROGRADE CHOLANGIOPANCREATOGRAPHY (ERCP) WITH PROPOFOL;  Surgeon: Iva Boop,  MD;  Location: [facility] ENDOSCOPY;  Service: Gastroenterology;  Laterality: N/A;   ERCP N/A 12/02/2022   Procedure: ENDOSCOPIC RETROGRADE CHOLANGIOPANCREATOGRAPHY (ERCP);  Surgeon: [name]., MD;  Location: [facility] ENDOSCOPY;  Service: Gastroenterology;  Laterality: N/A;   ESOPHAGOGASTRODUODENOSCOPY N/A 03/03/2000   ESOPHAGOGASTRODUODENOSCOPY (EGD) WITH PROPOFOL N/A 10/16/2021   Procedure: ESOPHAGOGASTRODUODENOSCOPY (EGD) WITH PROPOFOL;  Surgeon: Regis Bill, MD;  Location: [facility] ENDOSCOPY;  Service: Endoscopy;  Laterality:  N/A;   ESOPHAGOGASTRODUODENOSCOPY (EGD) WITH PROPOFOL N/A 01/31/2023   Procedure: ESOPHAGOGASTRODUODENOSCOPY (EGD) WITH PROPOFOL;  Surgeon: Imogene Burn, MD;  Location: [facility] ENDOSCOPY;  Service: Gastroenterology;  Laterality: N/A;   ESOPHAGOGASTRODUODENOSCOPY (EGD) WITH PROPOFOL N/A 02/16/2023   Procedure: ESOPHAGOGASTRODUODENOSCOPY (EGD) WITH PROPOFOL;  Surgeon: Meridee Score Netty Starring., MD;  Location: [facility] ENDOSCOPY;  Service: Gastroenterology;  Laterality: N/A;   EUS N/A 02/16/2023   Procedure: UPPER ENDOSCOPIC ULTRASOUND (EUS) LINEAR;  Surgeon: [name]., MD;  Location: [facility] ENDOSCOPY;  Service: Gastroenterology;  Laterality: N/A;   EXTRACORPOREAL SHOCK WAVE LITHOTRIPSY Left 08/06/2021   Procedure: EXTRACORPOREAL SHOCK WAVE LITHOTRIPSY (ESWL);  Surgeon: Vanna Scotland, MD;  Location: [facility] ORS;  Service: Urology;  Laterality: Left;   INGUINAL HERNIA REPAIR Right 11/27/2021   Procedure: HERNIA REPAIR INGUINAL ADULT;  Surgeon: Earline Mayotte, MD;  Location: [facility] ORS;  Service: General;  Laterality: Right;   INTRAOCULAR LENS EXCHANGE Left 12/27/2018   Procedure: MECHANICAL VITRECTOMY,  EXCHANGE LENS PROSTHESIS VIA PARS PLANA APPROACH; Location: [facility]; Surgeon: Nicholaus Corolla, MD   MOHS SURGERY Bilateral 01/27/2015   Procedure: MOHS SURGERY TO REMOVE BASAL CELL CARCINOMA FROM RIGHT/LEFT NASAL ALA; Location: [facility]; Surgeon: Katrine Coho, MD   REMOVAL OF STONES  12/02/2022   Procedure: REMOVAL OF STONES;  Surgeon: Meridee Score Netty Starring., MD;  Location: [facility] ENDOSCOPY;  Service: Gastroenterology;;   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   Procedure: RPR RETINAL DTCHMNT W/VITRECTOMY ANY METH REPAIR RETINAL DETACHMENT; WITH VITRECTOMY, INC, WHEN PERFORMED, AIR/GAS TAMPONADE, FOCAL ENDOLASER PHOTOCOAGULATION, CRYOTHERAPY, DRAINAGE OF SUBRETINAL FLUID, SCLERAL BUCKLING AND/OR REMOVAL OF LENS; Location: [facility]; Surgeon: Nicholaus Corolla, MD   SINUSOTOMY N/A    Procedure: MAXILLARY SINUSOTOMY INTRANASAL    SPHINCTEROTOMY  12/02/2022   Procedure: SPHINCTEROTOMY;  Surgeon: Mansouraty, Netty Starring., MD;  Location: [facility] ENDOSCOPY;  Service: Gastroenterology;;   TONSILLECTOMY AND ADENOIDECTOMY N/A    Patient Active Problem List   Diagnosis Date Noted   Hypokalemia 02/16/2023   Gastric outflow obstruction 02/14/2023   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   Acute recurrent pancreatitis 02/13/2023   Intractable nausea and vomiting 02/13/2023   Displaced nasogastric feeding tube 02/12/2023   Esophageal stenosis 02/12/2023   Chronic pancreatitis (HCC) 02/12/2023   Leukocytosis 02/12/2023   Essential hypertension 02/12/2023   History of CAD (coronary artery disease) 02/12/2023   Atrial fibrillation, chronic -not on coagulation (HCC) 02/12/2023   Chronic disease anemia 02/12/2023   Encounter for feeding tube placement 02/12/2023   Malnutrition of moderate degree 02/01/2023   Nausea and vomiting 01/31/2023   Abnormal CT scan 01/31/2023   Acute pancreatitis 01/28/2023   Chronic diarrhea 12/05/2022   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   Hemorrhagic pancreatitis 11/30/2022   History of ERCP 11/30/2022   Biliary obstruction 11/30/2022   Moderate protein-calorie malnutrition (HCC) 11/30/2022   Gallstone pancreatitis 11/30/2022   Choledocholithiasis 11/21/2022   Acute gallstone pancreatitis 11/17/2022   PAF (paroxysmal atrial fibrillation) (HCC) 11/16/2021   Diabetes type 2, controlled (HCC) 05/15/2018   Hyperlipidemia, mixed 10/28/2015   CAD (coronary artery disease) 05/12/2013   GERD (gastroesophageal reflux disease) 05/12/2013  ONSET DATE:  10 months ago following multiple surgeries   REFERRING DIAG: Paralysis of vocal cords and larynx, 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 Pt with PEG placement for pancreatitis.  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 Gradual improvement over time. CT was WNL. Pt reports had repeat laryngoscopy since my last visit and was told it was stronger. Mirror exam today shows good movement and patient's symptoms are improving. Recommend continue home speech exercises.   PAIN:  Are you having pain? No   FALLS: Has patient fallen in last 6 months? No,   LIVING ENVIRONMENT: Lives with: lives with their family Lives in: House/apartment  PLOF: Independent  PATIENT GOALS    to improve vocal quality  SUBJECTIVE STATEMENT: Pt observed with improved vocal quality and intensity during casual conversation  Pt accompanied by: self  OBJECTIVE:    TODAY'S TREATMENT SESSION:   Skilled treatment session focused on pt's dysphonia goals. SLP facilitated the session by providing the following interventions:  Pt was Mod I increased respiratory support during 15 minute sustained reading task. Pt reported decreased fatigue at end of reading. Progress made.   PATIENT EDUCATION: Education details: see above Person educated: Patient Education method: Explanation Education comprehension: needs further education   HOME EXERCISE PROGRAM: Pushing during phonation Increasing hydration EMST     GOALS: Goals reviewed with patient? Yes  SHORT TERM GOALS: Target date: 10 sessions  The patient will maximize voice quality and loudness using breath support/oral resonance for sustained vowel production, pitch glides, and hierarchal speech drill.  Baseline: Goal status: INITIAL  2.  The patient will demonstrate abdominal breathing patterns and steady release of breath on exhalation to optimize efficiency of voicing and decrease laryngeal hyperfunction.  Baseline:  Goal status: INITIAL  3.  The patient will  increase hydration for an eventual goal of 6-8 glasses per day and limit caffeine intake (to maximum of 1-2, 8 oz cups/day), as measured by patient report.  Baseline:  Goal status: INITIAL  LONG TERM GOALS: Target date: 07/13/2023  The patient will participate in 5-8 minutes conversation, maintaining average loudness of 75 dB and loud, good quality voice with min cues.   Baseline:  Goal status: INITIAL  2.  The patient will be independent for abdominal breathing and breath support exercises.  Baseline:  Goal status: INITIAL  3.  Patient will report improved communication effectiveness as measured by PROM Baseline:  Goal status: INITIAL   ASSESSMENT:  CLINICAL IMPRESSION: Patient is a 82 y.o. 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   Pt continues to make good progress towards goals of improved phonation and increased respiratory support.  See the above treatment note for details.   OBJECTIVE IMPAIRMENTS include voice disorder. These impairments are limiting patient from effectively communicating at home and in community. Factors affecting potential to achieve goals and functional outcome are  decreased PO consumption . Patient will benefit from skilled SLP services to address above impairments and improve overall function.  REHAB POTENTIAL: Good  PLAN: SLP FREQUENCY: 1-2x/week  SLP DURATION: 8 weeks  PLANNED INTERVENTIONS: SLP instruction and feedback and Patient/family education  Mustafa Potts B. Dreama Saa, M.S., CCC-SLP, Tree surgeon Certified Brain Injury Specialist [facility]  [facility] Rehabilitation Services Office [phone] Ascom [phone] Fax [phone]

## 2023-06-23 ENCOUNTER — Ambulatory Visit: Payer: HMO | Admitting: Speech Pathology

## 2023-06-23 ENCOUNTER — Ambulatory Visit: Payer: HMO | Admitting: Physical Therapy

## 2023-06-23 DIAGNOSIS — R490 Dysphonia: Secondary | ICD-10-CM

## 2023-06-23 NOTE — Therapy (Addendum)
OUTPATIENT SPEECH LANGUAGE PATHOLOGY  VOICE TREATMENT NOTE 10th VISIT PROGRESS NOTE    Patient Name: Jon Bray MRN: [id number] DOB:Jun 21, 1941, 82 y.o., male Today's Date: 06/23/2023  PCP: Bethann Punches, MD REFERRING PROVIDER: Bud Face, MD  Speech Therapy Progress Note  Dates of Reporting Period: 05/18/2023 to 06/23/2023  Objective: Patient has been seen for 10 speech therapy sessions this reporting period targeting his moderate dysphonia. Patient is making progress toward LTGs and met 2 STGs this reporting period. See skilled intervention, clinical impressions, and goals below for details.    End of Session - 06/23/23 1033     Visit Number 10    Number of Visits 17    Date for SLP Re-Evaluation 07/13/23    Authorization Type [employer]    Progress Note Due on Visit 10    SLP Start Time 1015    SLP Stop Time  1100    SLP Time Calculation (min) 45 min    Activity Tolerance Patient tolerated treatment well             Past Medical History:  Diagnosis Date   Anginal pain (HCC)    Aortic atherosclerosis (HCC)    Aortic root enlargement (HCC) 12/27/2017   a.) cCTA 12/27/2017: measured 3.9 cm.   Basal cell carcinoma of skin    a.) s/p Mohs procedure R/L nasal ala 01/27/2015   CAD (coronary artery disease) 05/12/2013   a.) STEMI --> LHC 05/12/2013: 20% p-mLAD, 20% D2, 40% LPDA, 30% LPAV, 99% dLCx --> PCI placing 4.5 x 20 mm and 2.5 x 12 mm Veriflex BMS to LPAV extending to dLCx. b.) cCTA 12/27/2017: Ca score 1578; 82nd percentile for age/sex match control; CT-FFR: RCA 0.99, pLAD 0.97, mLAD 0.98, dLAD 0.89, pLCx 0.98, mLCx 0.97, dLCx 0.97, PLB 0.91, PDA (most distal segment) 0.80.   Cataract    a.) s/p extraction with IOL placement   CKD (chronic kidney disease)    Diverticulosis    GERD (gastroesophageal reflux disease)    History of kidney stones 2023   HLD (hyperlipidemia)    Left lower lobe pulmonary nodule 08/01/2021   a.) CT 08/01/2021:  measured 4 mm; perifissural   Long term current use of anticoagulant    a.) apixaban   NSVT (nonsustained ventricular tachycardia) (HCC) 05/13/2013   a.) 24 beat run while in ICU following PCI for STEMI   PAF (paroxysmal atrial fibrillation) (HCC) 11/16/2021   a.) new onset in ED on 11/16/2021. b.) CHA2DS2-VASc = 4 (age x 2, STEMI/aortic plaque, T2DM). c.) rate controlled without pharmacological intervention; started on apixaban 11/16/2021, however PCP discontinued on 11/18/2021 pending Holter study results.   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   Type 2 diabetes, diet controlled [facility])    Past Surgical History:  Procedure Laterality Date   BIOPSY  12/02/2022   Procedure: BIOPSY;  Surgeon: [name]., MD;  Location: [facility] ENDOSCOPY;  Service: Gastroenterology;;   BIOPSY  01/31/2023   Procedure: BIOPSY;  Surgeon: Imogene Burn, MD;  Location: [facility] ENDOSCOPY;  Service: Gastroenterology;;   CATARACT EXTRACTION W/ INTRAOCULAR LENS IMPLANT Bilateral    CHOLECYSTECTOMY N/A 12/03/2022   Procedure: LAPAROSCOPIC CHOLECYSTECTOMY;  Surgeon: [name], MD;  Location: [facility] OR;  Service: General;  Laterality: N/A;   COLONOSCOPY N/A 01/31/2001   COLONOSCOPY N/A 11/19/2008   CORONARY ANGIOPLASTY WITH STENT PLACEMENT Left 05/12/2013   Procedure: CORONARY ANGIOPLASTY WITH STENT PLACEMENT; Location: [facility]; Surgeon: Lieutenant Diego, MD   ENDOSCOPIC RETROGRADE CHOLANGIOPANCREATOGRAPHY (ERCP) WITH PROPOFOL N/A 11/22/2022   Procedure: ENDOSCOPIC RETROGRADE CHOLANGIOPANCREATOGRAPHY (ERCP) WITH PROPOFOL;  Surgeon: Iva Boop, MD;  Location: [facility] ENDOSCOPY;  Service: Gastroenterology;  Laterality: N/A;   ERCP N/A 12/02/2022   Procedure: ENDOSCOPIC RETROGRADE CHOLANGIOPANCREATOGRAPHY  (ERCP);  Surgeon: [name]., MD;  Location: [facility] ENDOSCOPY;  Service: Gastroenterology;  Laterality: N/A;   ESOPHAGOGASTRODUODENOSCOPY N/A 03/03/2000   ESOPHAGOGASTRODUODENOSCOPY (EGD) WITH PROPOFOL N/A 10/16/2021   Procedure: ESOPHAGOGASTRODUODENOSCOPY (EGD) WITH PROPOFOL;  Surgeon: Regis Bill, MD;  Location: [facility] ENDOSCOPY;  Service: Endoscopy;  Laterality: N/A;   ESOPHAGOGASTRODUODENOSCOPY (EGD) WITH PROPOFOL N/A 01/31/2023   Procedure: ESOPHAGOGASTRODUODENOSCOPY (EGD) WITH PROPOFOL;  Surgeon: Imogene Burn, MD;  Location: [facility] ENDOSCOPY;  Service: Gastroenterology;  Laterality: N/A;   ESOPHAGOGASTRODUODENOSCOPY (EGD) WITH PROPOFOL N/A 02/16/2023   Procedure: ESOPHAGOGASTRODUODENOSCOPY (EGD) WITH PROPOFOL;  Surgeon: Meridee Score Netty Starring., MD;  Location: [facility] ENDOSCOPY;  Service: Gastroenterology;  Laterality: N/A;   EUS N/A 02/16/2023   Procedure: UPPER ENDOSCOPIC ULTRASOUND (EUS) LINEAR;  Surgeon: [name]., MD;  Location: [facility] ENDOSCOPY;  Service: Gastroenterology;  Laterality: N/A;   EXTRACORPOREAL SHOCK WAVE LITHOTRIPSY Left 08/06/2021   Procedure: EXTRACORPOREAL SHOCK WAVE LITHOTRIPSY (ESWL);  Surgeon: Vanna Scotland, MD;  Location: [facility] ORS;  Service: Urology;  Laterality: Left;   INGUINAL HERNIA REPAIR Right 11/27/2021   Procedure: HERNIA REPAIR INGUINAL ADULT;  Surgeon: Earline Mayotte, MD;  Location: [facility] ORS;  Service: General;  Laterality: Right;   INTRAOCULAR LENS EXCHANGE Left 12/27/2018   Procedure: MECHANICAL VITRECTOMY,  EXCHANGE LENS PROSTHESIS VIA PARS PLANA APPROACH; Location: [facility]; Surgeon: Nicholaus Corolla, MD   MOHS SURGERY Bilateral 01/27/2015   Procedure: MOHS SURGERY TO REMOVE BASAL CELL CARCINOMA FROM RIGHT/LEFT NASAL ALA; Location: [facility]; Surgeon: Katrine Coho, MD   REMOVAL OF STONES  12/02/2022   Procedure: REMOVAL OF STONES;  Surgeon: Meridee Score Netty Starring., MD;  Location: [facility] ENDOSCOPY;  Service: Gastroenterology;;   RETINAL DETACHMENT  SURGERY Left 01/13/2019   Procedure: RPR RETINAL DTCHMNT W/VITRECTOMY ANY METH REPAIR RETINAL DETACHMENT; WITH VITRECTOMY, INC, WHEN PERFORMED, AIR/GAS TAMPONADE, FOCAL ENDOLASER PHOTOCOAGULATION, CRYOTHERAPY, DRAINAGE OF SUBRETINAL FLUID, SCLERAL BUCKLING AND/OR REMOVAL OF LENS; Location: [facility]; Surgeon: Nicholaus Corolla, MD   SINUSOTOMY N/A    Procedure: MAXILLARY SINUSOTOMY INTRANASAL   SPHINCTEROTOMY  12/02/2022   Procedure: SPHINCTEROTOMY;  Surgeon: Mansouraty, Netty Starring., MD;  Location: [facility] ENDOSCOPY;  Service: Gastroenterology;;   TONSILLECTOMY AND ADENOIDECTOMY N/A    Patient Active Problem List   Diagnosis Date Noted   Hypokalemia 02/16/2023   Gastric outflow obstruction 02/14/2023   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   Acute recurrent pancreatitis 02/13/2023   Intractable nausea and vomiting 02/13/2023   Displaced nasogastric feeding tube 02/12/2023   Esophageal stenosis 02/12/2023   Chronic pancreatitis (HCC) 02/12/2023   Leukocytosis 02/12/2023   Essential hypertension 02/12/2023   History of CAD (coronary artery disease) 02/12/2023   Atrial fibrillation, chronic -not on coagulation (HCC) 02/12/2023   Chronic disease anemia 02/12/2023   Encounter for feeding tube placement 02/12/2023   Malnutrition of moderate degree 02/01/2023   Nausea and vomiting 01/31/2023   Abnormal CT scan 01/31/2023   Acute pancreatitis 01/28/2023   Chronic diarrhea 12/05/2022   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   Hemorrhagic pancreatitis 11/30/2022   History of ERCP 11/30/2022   Biliary obstruction  11/30/2022   Moderate protein-calorie malnutrition (HCC) 11/30/2022   Gallstone pancreatitis 11/30/2022   Choledocholithiasis 11/21/2022   Acute gallstone pancreatitis 11/17/2022   PAF (paroxysmal atrial fibrillation) (HCC) 11/16/2021   Diabetes type 2, controlled (HCC) 05/15/2018   Hyperlipidemia, mixed 10/28/2015   CAD (coronary artery disease) 05/12/2013   GERD (gastroesophageal reflux disease)  05/12/2013    ONSET DATE:  10 months ago following multiple surgeries   REFERRING DIAG: Paralysis of vocal cords and larynx, 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 Pt with PEG placement for pancreatitis.  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 Gradual improvement over time. CT was WNL. Pt reports had repeat laryngoscopy since my last visit and was told it was stronger. Mirror exam today shows good movement and patient's symptoms are improving. Recommend continue home speech exercises.   PAIN:  Are you having pain? No   FALLS: Has patient fallen in last 6 months? No,   LIVING ENVIRONMENT: Lives with: lives with their family Lives in: House/apartment  PLOF: Independent  PATIENT GOALS    to improve vocal quality  SUBJECTIVE STATEMENT: "I was wide-open yesterday" Pt accompanied by: self  OBJECTIVE:    TODAY'S TREATMENT SESSION:   Skilled treatment session focused on pt's dysphonia goals. SLP facilitated the session by providing the following interventions:  With moderate to maximal effort, pt able to produce 3 set so 4 repetitions set at 75 cmH2O  Pt was Mod I increased respiratory support during 25 minute sustained conversation. Pt reported decreased fatigue at end of conversation. Progress made.   PATIENT EDUCATION: Education details: see above Person educated: Patient Education method: Explanation Education comprehension: needs further education   HOME EXERCISE PROGRAM: Pushing during phonation Increasing hydration EMST     GOALS: Goals reviewed with patient? Yes  SHORT TERM GOALS: Target date: 10 sessions  Updated  06/23/2023 The patient will maximize voice quality and loudness using breath support/oral resonance for sustained vowel production, pitch glides, and hierarchal speech drill.  Baseline: Goal status: INITIAL - MET  2.  The patient will demonstrate abdominal breathing patterns and steady release of breath on exhalation to optimize efficiency of voicing and decrease laryngeal hyperfunction.  Baseline:  Goal status: INITIAL - progress made  3.  The patient will increase hydration for an eventual goal of 6-8 glasses per day and limit caffeine intake (to maximum of 1-2, 8 oz cups/day), as measured by patient report.  Baseline:  Goal status: INITIAL - progress made  LONG TERM GOALS: Target date: 07/13/2023  Updated 06/23/2023 The patient will participate in 5-8 minutes conversation, maintaining average loudness of 75 dB and loud, good quality voice with min cues.   Baseline:  Goal status: INITIAL - progress made  2.  The patient will be independent for abdominal breathing and breath support exercises.  Baseline:  Goal status: INITIAL - MET  3.  Patient will report improved communication effectiveness as measured by PROM Baseline:  Goal status: INITIAL - progress made   ASSESSMENT:  CLINICAL IMPRESSION: Patient is a 82 y.o. 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 He presents with mild to moderate dysphonia that is c/b decreased breath support, hoarse, raspy,  breathy phonation.   Pt reports that he had less fatigue when talking over crowd as he ate out last night and he als reports that pt understood his voice better.  See the above treatment note for details.   OBJECTIVE IMPAIRMENTS include voice disorder. These impairments are limiting patient from effectively communicating at home and in community. Factors affecting potential to achieve goals and functional outcome are  decreased PO consumption . Patient will benefit from skilled SLP  services to address above impairments and improve overall function.  REHAB POTENTIAL: Good  PLAN: SLP FREQUENCY: 1-2x/week  SLP DURATION: 8 weeks  PLANNED INTERVENTIONS: SLP instruction and feedback and Patient/family education  Brooklin Rieger B. Dreama Saa, M.S., CCC-SLP, Tree surgeon Certified Brain Injury Specialist [facility]  [facility] Rehabilitation Services Office [phone] Ascom [phone] Fax [phone]

## 2023-06-28 ENCOUNTER — Ambulatory Visit: Payer: HMO | Admitting: Physical Therapy

## 2023-06-28 ENCOUNTER — Ambulatory Visit: Payer: HMO

## 2023-06-30 DIAGNOSIS — C44222 Squamous cell carcinoma of skin of right ear and external auricular canal: Secondary | ICD-10-CM | POA: Diagnosis not present

## 2023-06-30 DIAGNOSIS — L821 Other seborrheic keratosis: Secondary | ICD-10-CM | POA: Diagnosis not present

## 2023-06-30 DIAGNOSIS — D485 Neoplasm of uncertain behavior of skin: Secondary | ICD-10-CM | POA: Diagnosis not present

## 2023-06-30 DIAGNOSIS — D2261 Melanocytic nevi of right upper limb, including shoulder: Secondary | ICD-10-CM | POA: Diagnosis not present

## 2023-06-30 DIAGNOSIS — D2262 Melanocytic nevi of left upper limb, including shoulder: Secondary | ICD-10-CM | POA: Diagnosis not present

## 2023-06-30 DIAGNOSIS — L570 Actinic keratosis: Secondary | ICD-10-CM | POA: Diagnosis not present

## 2023-06-30 DIAGNOSIS — D0439 Carcinoma in situ of skin of other parts of face: Secondary | ICD-10-CM | POA: Diagnosis not present

## 2023-06-30 DIAGNOSIS — D045 Carcinoma in situ of skin of trunk: Secondary | ICD-10-CM | POA: Diagnosis not present

## 2023-06-30 DIAGNOSIS — D225 Melanocytic nevi of trunk: Secondary | ICD-10-CM | POA: Diagnosis not present

## 2023-06-30 DIAGNOSIS — D2272 Melanocytic nevi of left lower limb, including hip: Secondary | ICD-10-CM | POA: Diagnosis not present

## 2023-06-30 DIAGNOSIS — C4491 Basal cell carcinoma of skin, unspecified: Secondary | ICD-10-CM | POA: Diagnosis not present

## 2023-06-30 DIAGNOSIS — D2271 Melanocytic nevi of right lower limb, including hip: Secondary | ICD-10-CM | POA: Diagnosis not present

## 2023-07-01 ENCOUNTER — Ambulatory Visit: Payer: HMO | Admitting: Physical Therapy

## 2023-07-01 ENCOUNTER — Ambulatory Visit: Payer: HMO | Admitting: Speech Pathology

## 2023-07-04 ENCOUNTER — Ambulatory Visit: Payer: HMO | Admitting: Physical Therapy

## 2023-07-05 ENCOUNTER — Ambulatory Visit: Payer: HMO | Admitting: Physical Therapy

## 2023-07-05 ENCOUNTER — Ambulatory Visit: Payer: HMO | Admitting: Speech Pathology

## 2023-07-05 DIAGNOSIS — Z931 Gastrostomy status: Secondary | ICD-10-CM | POA: Diagnosis not present

## 2023-07-05 DIAGNOSIS — L929 Granulomatous disorder of the skin and subcutaneous tissue, unspecified: Secondary | ICD-10-CM | POA: Diagnosis not present

## 2023-07-05 DIAGNOSIS — R1032 Left lower quadrant pain: Secondary | ICD-10-CM | POA: Diagnosis not present

## 2023-07-07 ENCOUNTER — Ambulatory Visit: Payer: HMO | Admitting: Physical Therapy

## 2023-07-08 ENCOUNTER — Ambulatory Visit: Payer: HMO | Admitting: Physical Therapy

## 2023-07-08 ENCOUNTER — Ambulatory Visit: Payer: HMO | Attending: Internal Medicine | Admitting: Speech Pathology

## 2023-07-08 DIAGNOSIS — R490 Dysphonia: Secondary | ICD-10-CM | POA: Diagnosis not present

## 2023-07-08 NOTE — Therapy (Addendum)
 OUTPATIENT SPEECH LANGUAGE PATHOLOGY  VOICE TREATMENT NOTE    Patient Name: Jon Bray MRN: [id number] DOB:01-06-1941, 83 y.o., male Today's Date: 07/08/2023  PCP: Oneil Pinal, MD REFERRING PROVIDER: Carolee Hunter, MD    End of Session - 07/08/23 0927     Visit Number 11    Number of Visits 17    Date for SLP Re-Evaluation 07/13/23    Authorization Type [employer]    Progress Note Due on Visit 20    SLP Start Time 0930    SLP Stop Time  1015    SLP Time Calculation (min) 45 min    Activity Tolerance Patient tolerated treatment well             Past Medical History:  Diagnosis Date   Anginal pain (HCC)    Aortic atherosclerosis (HCC)    Aortic root enlargement (HCC) 12/27/2017   a.) cCTA 12/27/2017: measured 3.9 cm.   Basal cell carcinoma of skin    a.) s/p Mohs procedure R/L nasal ala 01/27/2015   CAD (coronary artery disease) 05/12/2013   a.) STEMI --> LHC 05/12/2013: 20% p-mLAD, 20% D2, 40% LPDA, 30% LPAV, 99% dLCx --> PCI placing 4.5 x 20 mm and 2.5 x 12 mm Veriflex BMS to LPAV extending to dLCx. b.) cCTA 12/27/2017: Ca score 1578; 82nd percentile for age/sex match control; CT-FFR: RCA 0.99, pLAD 0.97, mLAD 0.98, dLAD 0.89, pLCx 0.98, mLCx 0.97, dLCx 0.97, PLB 0.91, PDA (most distal segment) 0.80.   Cataract    a.) s/p extraction with IOL placement   CKD (chronic kidney disease)    Diverticulosis    GERD (gastroesophageal reflux disease)    History of kidney stones 2023   HLD (hyperlipidemia)    Left lower lobe pulmonary nodule 08/01/2021   a.) CT 08/01/2021: measured 4 mm; perifissural   Long term current use of anticoagulant    a.) apixaban    NSVT (nonsustained ventricular tachycardia) (HCC) 05/13/2013   a.) 24 beat run while in ICU following PCI for STEMI   PAF (paroxysmal atrial fibrillation) (HCC) 11/16/2021   a.) new onset in ED on 11/16/2021. b.) CHA2DS2-VASc = 4 (age x 2, STEMI/aortic plaque, T2DM). c.) rate controlled without  pharmacological intervention; started on apixaban  11/16/2021, however PCP discontinued on 11/18/2021 pending Holter study results.   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   Type 2 diabetes, diet controlled [facility])    Past Surgical History:  Procedure Laterality Date   BIOPSY  12/02/2022   Procedure: BIOPSY;  Surgeon: Wilhelmenia Aloha Raddle., MD;  Location: [facility] ENDOSCOPY;  Service: Gastroenterology;;   BIOPSY  01/31/2023   Procedure: BIOPSY;  Surgeon: Federico Rosario BROCKS, MD;  Location: [facility] ENDOSCOPY;  Service: Gastroenterology;;   CATARACT EXTRACTION W/ INTRAOCULAR LENS IMPLANT Bilateral    CHOLECYSTECTOMY N/A 12/03/2022   Procedure: LAPAROSCOPIC CHOLECYSTECTOMY;  Surgeon: Signe Mitzie LABOR, MD;  Location: [facility] OR;  Service: General;  Laterality: N/A;   COLONOSCOPY N/A 01/31/2001   COLONOSCOPY N/A 11/19/2008   CORONARY ANGIOPLASTY WITH STENT PLACEMENT Left 05/12/2013   Procedure: CORONARY ANGIOPLASTY WITH STENT PLACEMENT; Location: [facility]; Surgeon: Lynwood Falls, MD   ENDOSCOPIC RETROGRADE CHOLANGIOPANCREATOGRAPHY (ERCP) WITH PROPOFOL  N/A 11/22/2022   Procedure: ENDOSCOPIC RETROGRADE CHOLANGIOPANCREATOGRAPHY (ERCP) WITH PROPOFOL ;  Surgeon: Avram,  Lupita BRAVO, MD;  Location: [facility] ENDOSCOPY;  Service: Gastroenterology;  Laterality: N/A;   ERCP N/A 12/02/2022   Procedure: ENDOSCOPIC RETROGRADE CHOLANGIOPANCREATOGRAPHY (ERCP);  Surgeon: Wilhelmenia Aloha Raddle., MD;  Location: [facility] ENDOSCOPY;  Service: Gastroenterology;  Laterality: N/A;   ESOPHAGOGASTRODUODENOSCOPY N/A 03/03/2000   ESOPHAGOGASTRODUODENOSCOPY (EGD) WITH PROPOFOL  N/A 10/16/2021   Procedure: ESOPHAGOGASTRODUODENOSCOPY (EGD) WITH PROPOFOL ;  Surgeon: Maryruth Ole DASEN, MD;  Location: [facility] ENDOSCOPY;  Service: Endoscopy;  Laterality:  N/A;   ESOPHAGOGASTRODUODENOSCOPY (EGD) WITH PROPOFOL  N/A 01/31/2023   Procedure: ESOPHAGOGASTRODUODENOSCOPY (EGD) WITH PROPOFOL ;  Surgeon: Federico Rosario BROCKS, MD;  Location[facility] ENDOSCOPY;  Service: Gastroenterology;  Laterality: N/A;   ESOPHAGOGASTRODUODENOSCOPY (EGD) WITH PROPOFOL  N/A 02/16/2023   Procedure: ESOPHAGOGASTRODUODENOSCOPY (EGD) WITH PROPOFOL ;  Surgeon: Wilhelmenia Aloha Raddle., MD;  Location: [facility] ENDOSCOPY;  Service: Gastroenterology;  Laterality: N/A;   EUS N/A 02/16/2023   Procedure: UPPER ENDOSCOPIC ULTRASOUND (EUS) LINEAR;  Surgeon: Wilhelmenia Aloha Raddle., MD;  Location: [facility] ENDOSCOPY;  Service: Gastroenterology;  Laterality: N/A;   EXTRACORPOREAL SHOCK WAVE LITHOTRIPSY Left 08/06/2021   Procedure: EXTRACORPOREAL SHOCK WAVE LITHOTRIPSY (ESWL);  Surgeon: Penne Knee, MD;  Location: [facility] ORS;  Service: Urology;  Laterality: Left;   INGUINAL HERNIA REPAIR Right 11/27/2021   Procedure: HERNIA REPAIR INGUINAL ADULT;  Surgeon: Dessa Reyes ORN, MD;  Location: [facility] ORS;  Service: General;  Laterality: Right;   INTRAOCULAR LENS EXCHANGE Left 12/27/2018   Procedure: MECHANICAL VITRECTOMY,  EXCHANGE LENS PROSTHESIS VIA PARS PLANA APPROACH; Location: [facility]; Surgeon: Diona Mana, MD   MOHS SURGERY Bilateral 01/27/2015   Procedure: MOHS SURGERY TO REMOVE BASAL CELL CARCINOMA FROM RIGHT/LEFT NASAL ALA; Location: [facility]; Surgeon: Adine Gold, MD   REMOVAL OF STONES  12/02/2022   Procedure: REMOVAL OF STONES;  Surgeon: Wilhelmenia Aloha Raddle., MD;  Location: [facility] ENDOSCOPY;  Service: Gastroenterology;;   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   Procedure: RPR RETINAL DTCHMNT W/VITRECTOMY ANY METH REPAIR RETINAL DETACHMENT; WITH VITRECTOMY, INC, WHEN PERFORMED, AIR/GAS TAMPONADE, FOCAL ENDOLASER PHOTOCOAGULATION, CRYOTHERAPY, DRAINAGE OF SUBRETINAL FLUID, SCLERAL BUCKLING AND/OR REMOVAL OF LENS; Location: [facility]; Surgeon: Diona Mana, MD   SINUSOTOMY N/A    Procedure: MAXILLARY SINUSOTOMY INTRANASAL    SPHINCTEROTOMY  12/02/2022   Procedure: SPHINCTEROTOMY;  Surgeon: Mansouraty, Aloha Raddle., MD;  Location: [facility] ENDOSCOPY;  Service: Gastroenterology;;   TONSILLECTOMY AND ADENOIDECTOMY N/A    Patient Active Problem List   Diagnosis Date Noted   Hypokalemia 02/16/2023   Gastric outflow obstruction 02/14/2023   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   Acute recurrent pancreatitis 02/13/2023   Intractable nausea and vomiting 02/13/2023   Displaced nasogastric feeding tube 02/12/2023   Esophageal stenosis 02/12/2023   Chronic pancreatitis (HCC) 02/12/2023   Leukocytosis 02/12/2023   Essential hypertension 02/12/2023   History of CAD (coronary artery disease) 02/12/2023   Atrial fibrillation, chronic -not on coagulation (HCC) 02/12/2023   Chronic disease anemia 02/12/2023   Encounter for feeding tube placement 02/12/2023   Malnutrition of moderate degree 02/01/2023   Nausea and vomiting 01/31/2023   Abnormal CT scan 01/31/2023   Acute pancreatitis 01/28/2023   Chronic diarrhea 12/05/2022   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   Hemorrhagic pancreatitis 11/30/2022   History of ERCP 11/30/2022   Biliary obstruction 11/30/2022   Moderate protein-calorie malnutrition (HCC) 11/30/2022   Gallstone pancreatitis 11/30/2022   Choledocholithiasis 11/21/2022   Acute gallstone pancreatitis 11/17/2022   PAF (paroxysmal atrial fibrillation) (HCC) 11/16/2021   Diabetes type 2, controlled (HCC) 05/15/2018   Hyperlipidemia, mixed 10/28/2015   CAD (coronary artery disease) 05/12/2013   GERD (gastroesophageal reflux disease)  05/12/2013    ONSET DATE:  10 months ago following multiple surgeries   REFERRING DIAG: Paralysis of vocal cords and larynx, 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 Pt with PEG placement for pancreatitis.  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 Gradual improvement over time. CT was WNL. Pt reports had repeat laryngoscopy since my last visit and was told it was stronger. Mirror exam today shows good movement and patient's symptoms are improving. Recommend continue home speech exercises.   PAIN:  Are you having pain? No   FALLS: Has patient fallen in last 6 months? No,   LIVING ENVIRONMENT: Lives with: lives with their family Lives in: House/apartment  PLOF: Independent  PATIENT GOALS    to improve vocal quality  SUBJECTIVE STATEMENT: We had a great time Pt accompanied by: self  OBJECTIVE:    TODAY'S TREATMENT SESSION:   Skilled treatment session focused on pt's dysphonia goals. SLP facilitated the session by providing the following interventions:  Pt engaged in spontaneous semi-complex game Would You Rather? With use of increased respiratory support throughout 45 minutes and improved vocal quality.    PATIENT EDUCATION: Education details: see above Person educated: Patient Education method: Explanation Education comprehension: needs further education   HOME EXERCISE PROGRAM: Pushing during phonation Increasing hydration EMST     GOALS: Goals reviewed with patient? Yes  SHORT TERM GOALS: Target date: 10 sessions  Updated 06/23/2023 The patient will maximize voice quality and loudness using breath support/oral resonance for sustained vowel production, pitch glides, and hierarchal speech drill.  Baseline: Goal status: INITIAL - MET  2.  The patient will demonstrate abdominal breathing patterns and steady release of breath on exhalation to optimize efficiency of voicing and decrease laryngeal hyperfunction.  Baseline:  Goal status: INITIAL - progress made  3.  The patient will increase  hydration for an eventual goal of 6-8 glasses per day and limit caffeine intake (to maximum of 1-2, 8 oz cups/day), as measured by patient report.  Baseline:  Goal status: INITIAL - progress made  LONG TERM GOALS: Target date: 07/13/2023  Updated 06/23/2023 The patient will participate in 5-8 minutes conversation, maintaining average loudness of 75 dB and loud, good quality voice with min cues.   Baseline:  Goal status: INITIAL - progress made  2.  The patient will be independent for abdominal breathing and breath support exercises.  Baseline:  Goal status: INITIAL - MET  3.  Patient will report improved communication effectiveness as measured by PROM Baseline:  Goal status: INITIAL - progress made   ASSESSMENT:  CLINICAL IMPRESSION: Patient is a 83 y.o. 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   Pt returns after missing several weeks d/t holidays. He presents with overall improvement as evidenced by having his feeding tube removed this week and improved vocal ability. See the above treatment note for details.   OBJECTIVE IMPAIRMENTS include voice disorder. These impairments are limiting patient from effectively communicating at home and in community. Factors affecting potential to achieve goals and functional outcome are  decreased PO consumption . Patient  will benefit from skilled SLP services to address above impairments and improve overall function.  REHAB POTENTIAL: Good  PLAN: SLP FREQUENCY: 1-2x/week  SLP DURATION: 8 weeks  PLANNED INTERVENTIONS: SLP instruction and feedback and Patient/family education  Amare Kontos B. Rubbie, M.S., CCC-SLP, Tree Surgeon Certified Brain Injury Specialist [facility]  [facility] Rehabilitation Services Office [phone] Ascom  [phone] Fax [phone]

## 2023-07-11 ENCOUNTER — Ambulatory Visit: Payer: HMO | Admitting: Speech Pathology

## 2023-07-11 ENCOUNTER — Ambulatory Visit: Payer: HMO | Admitting: Physical Therapy

## 2023-07-11 DIAGNOSIS — R490 Dysphonia: Secondary | ICD-10-CM | POA: Diagnosis not present

## 2023-07-11 NOTE — Therapy (Signed)
 OUTPATIENT SPEECH LANGUAGE PATHOLOGY  VOICE TREATMENT NOTE   Patient Name: Jon Bray MRN: [id number] DOB:Feb 15, 1941, 83 y.o., male Today's Date: 07/11/2023  PCP: Oneil Pinal, MD REFERRING PROVIDER: Carolee Hunter, MD    End of Session - 07/11/23 1101     Visit Number 12    Number of Visits 17    Date for SLP Re-Evaluation 07/13/23    Authorization Type [employer]    Progress Note Due on Visit 20    SLP Start Time 1100    SLP Stop Time  1145    SLP Time Calculation (min) 45 min    Activity Tolerance Patient tolerated treatment well             Past Medical History:  Diagnosis Date   Anginal pain (HCC)    Aortic atherosclerosis (HCC)    Aortic root enlargement (HCC) 12/27/2017   a.) cCTA 12/27/2017: measured 3.9 cm.   Basal cell carcinoma of skin    a.) s/p Mohs procedure R/L nasal ala 01/27/2015   CAD (coronary artery disease) 05/12/2013   a.) STEMI --> LHC 05/12/2013: 20% p-mLAD, 20% D2, 40% LPDA, 30% LPAV, 99% dLCx --> PCI placing 4.5 x 20 mm and 2.5 x 12 mm Veriflex BMS to LPAV extending to dLCx. b.) cCTA 12/27/2017: Ca score 1578; 82nd percentile for age/sex match control; CT-FFR: RCA 0.99, pLAD 0.97, mLAD 0.98, dLAD 0.89, pLCx 0.98, mLCx 0.97, dLCx 0.97, PLB 0.91, PDA (most distal segment) 0.80.   Cataract    a.) s/p extraction with IOL placement   CKD (chronic kidney disease)    Diverticulosis    GERD (gastroesophageal reflux disease)    History of kidney stones 2023   HLD (hyperlipidemia)    Left lower lobe pulmonary nodule 08/01/2021   a.) CT 08/01/2021: measured 4 mm; perifissural   Long term current use of anticoagulant    a.) apixaban    NSVT (nonsustained ventricular tachycardia) (HCC) 05/13/2013   a.) 24 beat run while in ICU following PCI for STEMI   PAF (paroxysmal atrial fibrillation) (HCC) 11/16/2021   a.) new onset in ED on 11/16/2021. b.) CHA2DS2-VASc = 4 (age x 2, STEMI/aortic plaque, T2DM). c.) rate controlled without  pharmacological intervention; started on apixaban  11/16/2021, however PCP discontinued on 11/18/2021 pending Holter study results.   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   Type 2 diabetes, diet controlled [facility])    Past Surgical History:  Procedure Laterality Date   BIOPSY  12/02/2022   Procedure: BIOPSY;  Surgeon: Wilhelmenia Aloha Raddle., MD;  Location: [facility] ENDOSCOPY;  Service: Gastroenterology;;   BIOPSY  01/31/2023   Procedure: BIOPSY;  Surgeon: Federico Rosario BROCKS, MD;  Location: [facility] ENDOSCOPY;  Service: Gastroenterology;;   CATARACT EXTRACTION W/ INTRAOCULAR LENS IMPLANT Bilateral    CHOLECYSTECTOMY N/A 12/03/2022   Procedure: LAPAROSCOPIC CHOLECYSTECTOMY;  Surgeon: Signe Mitzie LABOR, MD;  Location: [facility] OR;  Service: General;  Laterality: N/A;   COLONOSCOPY N/A 01/31/2001   COLONOSCOPY N/A 11/19/2008   CORONARY ANGIOPLASTY WITH STENT PLACEMENT Left 05/12/2013   Procedure: CORONARY ANGIOPLASTY WITH STENT PLACEMENT; Location: [facility]; Surgeon: Lynwood Falls, MD   ENDOSCOPIC RETROGRADE CHOLANGIOPANCREATOGRAPHY (ERCP) WITH PROPOFOL  N/A 11/22/2022   Procedure: ENDOSCOPIC RETROGRADE CHOLANGIOPANCREATOGRAPHY (ERCP) WITH PROPOFOL ;  Surgeon: Avram Pitts  E, MD;  Location: [facility] ENDOSCOPY;  Service: Gastroenterology;  Laterality: N/A;   ERCP N/A 12/02/2022   Procedure: ENDOSCOPIC RETROGRADE CHOLANGIOPANCREATOGRAPHY (ERCP);  Surgeon: Wilhelmenia Aloha Raddle., MD;  Location: [facility] ENDOSCOPY;  Service: Gastroenterology;  Laterality: N/A;   ESOPHAGOGASTRODUODENOSCOPY N/A 03/03/2000   ESOPHAGOGASTRODUODENOSCOPY (EGD) WITH PROPOFOL  N/A 10/16/2021   Procedure: ESOPHAGOGASTRODUODENOSCOPY (EGD) WITH PROPOFOL ;  Surgeon: Maryruth Ole DASEN, MD;  Location: [facility] ENDOSCOPY;  Service: Endoscopy;  Laterality:  N/A;   ESOPHAGOGASTRODUODENOSCOPY (EGD) WITH PROPOFOL  N/A 01/31/2023   Procedure: ESOPHAGOGASTRODUODENOSCOPY (EGD) WITH PROPOFOL ;  Surgeon: Federico Rosario BROCKS, MD;  Location: [facility] ENDOSCOPY;  Service: Gastroenterology;  Laterality: N/A;   ESOPHAGOGASTRODUODENOSCOPY (EGD) WITH PROPOFOL  N/A 02/16/2023   Procedure: ESOPHAGOGASTRODUODENOSCOPY (EGD) WITH PROPOFOL ;  Surgeon: Wilhelmenia Aloha Raddle., MD;  Location: [facility] ENDOSCOPY;  Service: Gastroenterology;  Laterality: N/A;   EUS N/A 02/16/2023   Procedure: UPPER ENDOSCOPIC ULTRASOUND (EUS) LINEAR;  Surgeon: Wilhelmenia Aloha Raddle., MD;  Location: [facility] ENDOSCOPY;  Service: Gastroenterology;  Laterality: N/A;   EXTRACORPOREAL SHOCK WAVE LITHOTRIPSY Left 08/06/2021   Procedure: EXTRACORPOREAL SHOCK WAVE LITHOTRIPSY (ESWL);  Surgeon: Penne Knee, MD;  Location: [facility] ORS;  Service: Urology;  Laterality: Left;   INGUINAL HERNIA REPAIR Right 11/27/2021   Procedure: HERNIA REPAIR INGUINAL ADULT;  Surgeon: Dessa Reyes ORN, MD;  Location: [facility] ORS;  Service: General;  Laterality: Right;   INTRAOCULAR LENS EXCHANGE Left 12/27/2018   Procedure: MECHANICAL VITRECTOMY,  EXCHANGE LENS PROSTHESIS VIA PARS PLANA APPROACH; Location: [facility]; Surgeon: Diona Mana, MD   MOHS SURGERY Bilateral 01/27/2015   Procedure: MOHS SURGERY TO REMOVE BASAL CELL CARCINOMA FROM RIGHT/LEFT NASAL ALA; Location: [facility]; Surgeon: Adine Gold, MD   REMOVAL OF STONES  12/02/2022   Procedure: REMOVAL OF STONES;  Surgeon: Wilhelmenia Aloha Raddle., MD;  Location: [facility] ENDOSCOPY;  Service: Gastroenterology;;   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   Procedure: RPR RETINAL DTCHMNT W/VITRECTOMY ANY METH REPAIR RETINAL DETACHMENT; WITH VITRECTOMY, INC, WHEN PERFORMED, AIR/GAS TAMPONADE, FOCAL ENDOLASER PHOTOCOAGULATION, CRYOTHERAPY, DRAINAGE OF SUBRETINAL FLUID, SCLERAL BUCKLING AND/OR REMOVAL OF LENS; Location: [facility]; Surgeon: Diona Mana, MD   SINUSOTOMY N/A    Procedure: MAXILLARY SINUSOTOMY INTRANASAL    SPHINCTEROTOMY  12/02/2022   Procedure: SPHINCTEROTOMY;  Surgeon: Mansouraty, Aloha Raddle., MD;  Location: [facility] ENDOSCOPY;  Service: Gastroenterology;;   TONSILLECTOMY AND ADENOIDECTOMY N/A    Patient Active Problem List   Diagnosis Date Noted   Hypokalemia 02/16/2023   Gastric outflow obstruction 02/14/2023   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   Acute recurrent pancreatitis 02/13/2023   Intractable nausea and vomiting 02/13/2023   Displaced nasogastric feeding tube 02/12/2023   Esophageal stenosis 02/12/2023   Chronic pancreatitis (HCC) 02/12/2023   Leukocytosis 02/12/2023   Essential hypertension 02/12/2023   History of CAD (coronary artery disease) 02/12/2023   Atrial fibrillation, chronic -not on coagulation (HCC) 02/12/2023   Chronic disease anemia 02/12/2023   Encounter for feeding tube placement 02/12/2023   Malnutrition of moderate degree 02/01/2023   Nausea and vomiting 01/31/2023   Abnormal CT scan 01/31/2023   Acute pancreatitis 01/28/2023   Chronic diarrhea 12/05/2022   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   Hemorrhagic pancreatitis 11/30/2022   History of ERCP 11/30/2022   Biliary obstruction 11/30/2022   Moderate protein-calorie malnutrition (HCC) 11/30/2022   Gallstone pancreatitis 11/30/2022   Choledocholithiasis 11/21/2022   Acute gallstone pancreatitis 11/17/2022   PAF (paroxysmal atrial fibrillation) (HCC) 11/16/2021   Diabetes type 2, controlled (HCC) 05/15/2018   Hyperlipidemia, mixed 10/28/2015   CAD (coronary artery disease) 05/12/2013   GERD (gastroesophageal reflux disease) 05/12/2013  ONSET DATE:  10 months ago following multiple surgeries   REFERRING DIAG: Paralysis of vocal cords and larynx, 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 Pt with PEG placement for pancreatitis.  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 Gradual improvement over time. CT was WNL. Pt reports had repeat laryngoscopy since my last visit and was told it was stronger. Mirror exam today shows good movement and patient's symptoms are improving. Recommend continue home speech exercises.   PAIN:  Are you having pain? No   FALLS: Has patient fallen in last 6 months? No,   LIVING ENVIRONMENT: Lives with: lives with their family Lives in: House/apartment  PLOF: Independent  PATIENT GOALS    to improve vocal quality  SUBJECTIVE STATEMENT: I have been disappointed in my voice Pt accompanied by: self  OBJECTIVE:    TODAY'S TREATMENT SESSION:   Skilled treatment session focused on pt's dysphonia goals. SLP facilitated the session by providing the following interventions:  Pt arrived to session stating I have been disappointed in my voice, it comes and goes. He further reported my ahs sounded awful this morning   With further questions, he reports that he is not performing them routinely 3 times per day while pushing against his kitchen bar as instructed - he reports He reports that he completes them intermittently and in different locations - I do them in the car when driving and I push against the steering wheel. He also reports that he had to turn down the resistance on EMST and he completes those exercises intermittently while relaxing on the couch.   Extensive verbal and written education provided on need to complete as part of a structured routine. During this session, pt was able to produce improved quality ah while pushing and with encouragement, he was able to produce 3 sets of 10 EMST with device set at 75 cm H2O while describing an 8 out of 10 effort level.   Pt states that he has an appt with  ENT on 07/26/2023.     PATIENT EDUCATION: Education details: see above Person educated: Patient Education method: Explanation Education comprehension: needs further education   HOME EXERCISE PROGRAM: Pushing during phonation Increasing hydration EMST     GOALS: Goals reviewed with patient? Yes  SHORT TERM GOALS: Target date: 10 sessions  Updated 06/23/2023 The patient will maximize voice quality and loudness using breath support/oral resonance for sustained vowel production, pitch glides, and hierarchal speech drill.  Baseline: Goal status: INITIAL - MET  2.  The patient will demonstrate abdominal breathing patterns and steady release of breath on exhalation to optimize efficiency of voicing and decrease laryngeal hyperfunction.  Baseline:  Goal status: INITIAL - progress made  3.  The patient will increase hydration for an eventual goal of 6-8 glasses per day and limit caffeine intake (to maximum of 1-2, 8 oz cups/day), as measured by patient report.  Baseline:  Goal status: INITIAL - progress made  LONG TERM GOALS: Target date: 07/13/2023  Updated 06/23/2023 The patient will participate in 5-8 minutes conversation, maintaining average loudness of 75 dB and loud, good quality voice with min cues.   Baseline:  Goal status: INITIAL - progress made  2.  The patient  will be independent for abdominal breathing and breath support exercises.  Baseline:  Goal status: INITIAL - MET  3.  Patient will report improved communication effectiveness as measured by PROM Baseline:  Goal status: INITIAL - progress made   ASSESSMENT:  CLINICAL IMPRESSION: Patient is a 83 y.o. 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   Pt commits to performing HEP as prescribed in writing in hopes of improving phonation. Will plan to  treat until his appt with ENT on 07/26/2023. See the above treatment note for details.   OBJECTIVE IMPAIRMENTS include voice disorder. These impairments are limiting patient from effectively communicating at home and in community. Factors affecting potential to achieve goals and functional outcome are  decreased PO consumption . Patient will benefit from skilled SLP services to address above impairments and improve overall function.  REHAB POTENTIAL: Good  PLAN: SLP FREQUENCY: 1-2x/week  SLP DURATION: 8 weeks  PLANNED INTERVENTIONS: SLP instruction and feedback and Patient/family education  Tylen Leverich B. Rubbie, M.S., CCC-SLP, Tree Surgeon Certified Brain Injury Specialist [facility]  [facility] Rehabilitation Services Office [phone] Ascom [phone] Fax [phone]

## 2023-07-13 ENCOUNTER — Ambulatory Visit: Payer: HMO | Admitting: Speech Pathology

## 2023-07-13 ENCOUNTER — Ambulatory Visit: Payer: HMO | Admitting: Physical Therapy

## 2023-07-13 DIAGNOSIS — R490 Dysphonia: Secondary | ICD-10-CM | POA: Diagnosis not present

## 2023-07-13 NOTE — Therapy (Signed)
 OUTPATIENT SPEECH LANGUAGE PATHOLOGY  VOICE TREATMENT NOTE   Patient Name: Tyjay Galindo MRN: [id number] DOB:1941-02-19, 83 y.o., male Today's Date: 07/13/2023  PCP: Oneil Pinal, MD REFERRING PROVIDER: Carolee Hunter, MD    End of Session - 07/13/23 1402     Visit Number 13    Number of Visits 17    Date for SLP Re-Evaluation 07/13/23    Authorization Type [employer]    Progress Note Due on Visit 20    SLP Start Time 1400    SLP Stop Time  1445    SLP Time Calculation (min) 45 min    Activity Tolerance Patient tolerated treatment well             Past Medical History:  Diagnosis Date   Anginal pain (HCC)    Aortic atherosclerosis (HCC)    Aortic root enlargement (HCC) 12/27/2017   a.) cCTA 12/27/2017: measured 3.9 cm.   Basal cell carcinoma of skin    a.) s/p Mohs procedure R/L nasal ala 01/27/2015   CAD (coronary artery disease) 05/12/2013   a.) STEMI --> LHC 05/12/2013: 20% p-mLAD, 20% D2, 40% LPDA, 30% LPAV, 99% dLCx --> PCI placing 4.5 x 20 mm and 2.5 x 12 mm Veriflex BMS to LPAV extending to dLCx. b.) cCTA 12/27/2017: Ca score 1578; 82nd percentile for age/sex match control; CT-FFR: RCA 0.99, pLAD 0.97, mLAD 0.98, dLAD 0.89, pLCx 0.98, mLCx 0.97, dLCx 0.97, PLB 0.91, PDA (most distal segment) 0.80.   Cataract    a.) s/p extraction with IOL placement   CKD (chronic kidney disease)    Diverticulosis    GERD (gastroesophageal reflux disease)    History of kidney stones 2023   HLD (hyperlipidemia)    Left lower lobe pulmonary nodule 08/01/2021   a.) CT 08/01/2021: measured 4 mm; perifissural   Long term current use of anticoagulant    a.) apixaban    NSVT (nonsustained ventricular tachycardia) (HCC) 05/13/2013   a.) 24 beat run while in ICU following PCI for STEMI   PAF (paroxysmal atrial fibrillation) (HCC) 11/16/2021   a.) new onset in ED on 11/16/2021. b.) CHA2DS2-VASc = 4 (age x 2, STEMI/aortic plaque, T2DM). c.) rate controlled without  pharmacological intervention; started on apixaban  11/16/2021, however PCP discontinued on 11/18/2021 pending Holter study results.   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   Type 2 diabetes, diet controlled [facility])    Past Surgical History:  Procedure Laterality Date   BIOPSY  12/02/2022   Procedure: BIOPSY;  Surgeon: Wilhelmenia Aloha Raddle., MD;  Location: [facility] ENDOSCOPY;  Service: Gastroenterology;;   BIOPSY  01/31/2023   Procedure: BIOPSY;  Surgeon: Federico Rosario BROCKS, MD;  Location: [facility] ENDOSCOPY;  Service: Gastroenterology;;   CATARACT EXTRACTION W/ INTRAOCULAR LENS IMPLANT Bilateral    CHOLECYSTECTOMY N/A 12/03/2022   Procedure: LAPAROSCOPIC CHOLECYSTECTOMY;  Surgeon: Signe Mitzie LABOR, MD;  Location[facility] OR;  Service: General;  Laterality: N/A;   COLONOSCOPY N/A 01/31/2001   COLONOSCOPY N/A 11/19/2008   CORONARY ANGIOPLASTY WITH STENT PLACEMENT Left 05/12/2013   Procedure: CORONARY ANGIOPLASTY WITH STENT PLACEMENT; Location: [facility]; Surgeon: Lynwood Falls, MD   ENDOSCOPIC RETROGRADE CHOLANGIOPANCREATOGRAPHY (ERCP) WITH PROPOFOL  N/A 11/22/2022   Procedure: ENDOSCOPIC RETROGRADE CHOLANGIOPANCREATOGRAPHY (ERCP) WITH PROPOFOL ;  Surgeon: Avram Pitts  E, MD;  Location: [facility] ENDOSCOPY;  Service: Gastroenterology;  Laterality: N/A;   ERCP N/A 12/02/2022   Procedure: ENDOSCOPIC RETROGRADE CHOLANGIOPANCREATOGRAPHY (ERCP);  Surgeon: Wilhelmenia Aloha Raddle., MD;  Location: [facility] ENDOSCOPY;  Service: Gastroenterology;  Laterality: N/A;   ESOPHAGOGASTRODUODENOSCOPY N/A 03/03/2000   ESOPHAGOGASTRODUODENOSCOPY (EGD) WITH PROPOFOL  N/A 10/16/2021   Procedure: ESOPHAGOGASTRODUODENOSCOPY (EGD) WITH PROPOFOL ;  Surgeon: Maryruth Ole DASEN, MD;  Location: [facility] ENDOSCOPY;  Service: Endoscopy;  Laterality:  N/A;   ESOPHAGOGASTRODUODENOSCOPY (EGD) WITH PROPOFOL  N/A 01/31/2023   Procedure: ESOPHAGOGASTRODUODENOSCOPY (EGD) WITH PROPOFOL ;  Surgeon: Federico Rosario BROCKS, MD;  Location: [facility] ENDOSCOPY;  Service: Gastroenterology;  Laterality: N/A;   ESOPHAGOGASTRODUODENOSCOPY (EGD) WITH PROPOFOL  N/A 02/16/2023   Procedure: ESOPHAGOGASTRODUODENOSCOPY (EGD) WITH PROPOFOL ;  Surgeon: Wilhelmenia Aloha Raddle., MD;  Location: [facility] ENDOSCOPY;  Service: Gastroenterology;  Laterality: N/A;   EUS N/A 02/16/2023   Procedure: UPPER ENDOSCOPIC ULTRASOUND (EUS) LINEAR;  Surgeon: Wilhelmenia Aloha Raddle., MD;  Location: [facility] ENDOSCOPY;  Service: Gastroenterology;  Laterality: N/A;   EXTRACORPOREAL SHOCK WAVE LITHOTRIPSY Left 08/06/2021   Procedure: EXTRACORPOREAL SHOCK WAVE LITHOTRIPSY (ESWL);  Surgeon: Penne Knee, MD;  Location: [facility] ORS;  Service: Urology;  Laterality: Left;   INGUINAL HERNIA REPAIR Right 11/27/2021   Procedure: HERNIA REPAIR INGUINAL ADULT;  Surgeon: Dessa Reyes ORN, MD;  Location: [facility] ORS;  Service: General;  Laterality: Right;   INTRAOCULAR LENS EXCHANGE Left 12/27/2018   Procedure: MECHANICAL VITRECTOMY,  EXCHANGE LENS PROSTHESIS VIA PARS PLANA APPROACH; Location: [facility]; Surgeon: Diona Mana, MD   MOHS SURGERY Bilateral 01/27/2015   Procedure: MOHS SURGERY TO REMOVE BASAL CELL CARCINOMA FROM RIGHT/LEFT NASAL ALA; Location: [facility]; Surgeon: Adine Gold, MD   REMOVAL OF STONES  12/02/2022   Procedure: REMOVAL OF STONES;  Surgeon: Wilhelmenia Aloha Raddle., MD;  Location: [facility] ENDOSCOPY;  Service: Gastroenterology;;   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   Procedure: RPR RETINAL DTCHMNT W/VITRECTOMY ANY METH REPAIR RETINAL DETACHMENT; WITH VITRECTOMY, INC, WHEN PERFORMED, AIR/GAS TAMPONADE, FOCAL ENDOLASER PHOTOCOAGULATION, CRYOTHERAPY, DRAINAGE OF SUBRETINAL FLUID, SCLERAL BUCKLING AND/OR REMOVAL OF LENS; Location: [facility]; Surgeon: Diona Mana, MD   SINUSOTOMY N/A    Procedure: MAXILLARY SINUSOTOMY INTRANASAL    SPHINCTEROTOMY  12/02/2022   Procedure: SPHINCTEROTOMY;  Surgeon: Mansouraty, Aloha Raddle., MD;  Location: [facility] ENDOSCOPY;  Service: Gastroenterology;;   TONSILLECTOMY AND ADENOIDECTOMY N/A    Patient Active Problem List   Diagnosis Date Noted   Hypokalemia 02/16/2023   Gastric outflow obstruction 02/14/2023   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   Acute recurrent pancreatitis 02/13/2023   Intractable nausea and vomiting 02/13/2023   Displaced nasogastric feeding tube 02/12/2023   Esophageal stenosis 02/12/2023   Chronic pancreatitis (HCC) 02/12/2023   Leukocytosis 02/12/2023   Essential hypertension 02/12/2023   History of CAD (coronary artery disease) 02/12/2023   Atrial fibrillation, chronic -not on coagulation (HCC) 02/12/2023   Chronic disease anemia 02/12/2023   Encounter for feeding tube placement 02/12/2023   Malnutrition of moderate degree 02/01/2023   Nausea and vomiting 01/31/2023   Abnormal CT scan 01/31/2023   Acute pancreatitis 01/28/2023   Chronic diarrhea 12/05/2022   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   Hemorrhagic pancreatitis 11/30/2022   History of ERCP 11/30/2022   Biliary obstruction 11/30/2022   Moderate protein-calorie malnutrition (HCC) 11/30/2022   Gallstone pancreatitis 11/30/2022   Choledocholithiasis 11/21/2022   Acute gallstone pancreatitis 11/17/2022   PAF (paroxysmal atrial fibrillation) (HCC) 11/16/2021   Diabetes type 2, controlled (HCC) 05/15/2018   Hyperlipidemia, mixed 10/28/2015   CAD (coronary artery disease) 05/12/2013   GERD (gastroesophageal reflux disease) 05/12/2013  ONSET DATE:  10 months ago following multiple surgeries   REFERRING DIAG: Paralysis of vocal cords and larynx, 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 Pt with PEG placement for pancreatitis.  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 Gradual improvement over time. CT was WNL. Pt reports had repeat laryngoscopy since my last visit and was told it was stronger. Mirror exam today shows good movement and patient's symptoms are improving. Recommend continue home speech exercises.   PAIN:  Are you having pain? No   FALLS: Has patient fallen in last 6 months? No,   LIVING ENVIRONMENT: Lives with: lives with their family Lives in: House/apartment  PLOF: Independent  PATIENT GOALS    to improve vocal quality  SUBJECTIVE STATEMENT: I have been doing my homework Pt accompanied by: self  OBJECTIVE:    TODAY'S TREATMENT SESSION:   Skilled treatment session focused on pt's dysphonia goals. SLP facilitated the session by providing the following interventions:  Pt states that doing the EMST exercises hurt his side (right) so he discontinued them. This clinical research associate agreed.   Despite use of increased vocal intensity, vocal cord adduction exercises, pt continued with increased dysphonia during today's session. Pt's dysphonia is c/b hoarse, breathy, strained vocal quality.    PATIENT EDUCATION: Education details: see above Person educated: Patient Education method: Explanation Education comprehension: needs further education   HOME EXERCISE PROGRAM: Pushing during phonation Increasing hydration EMST     GOALS: Goals reviewed with patient? Yes  SHORT TERM GOALS: Target date: 10 sessions  Updated 06/23/2023 The patient will maximize voice quality and loudness using breath support/oral resonance for sustained vowel production, pitch glides, and hierarchal speech drill.  Baseline: Goal status: INITIAL - MET  2.  The patient will demonstrate abdominal breathing patterns and steady release of breath on  exhalation to optimize efficiency of voicing and decrease laryngeal hyperfunction.  Baseline:  Goal status: INITIAL - progress made  3.  The patient will increase hydration for an eventual goal of 6-8 glasses per day and limit caffeine intake (to maximum of 1-2, 8 oz cups/day), as measured by patient report.  Baseline:  Goal status: INITIAL - progress made  LONG TERM GOALS: Target date: 07/13/2023  Updated 06/23/2023 The patient will participate in 5-8 minutes conversation, maintaining average loudness of 75 dB and loud, good quality voice with min cues.   Baseline:  Goal status: INITIAL - progress made  2.  The patient will be independent for abdominal breathing and breath support exercises.  Baseline:  Goal status: INITIAL - MET  3.  Patient will report improved communication effectiveness as measured by PROM Baseline:  Goal status: INITIAL - progress made   ASSESSMENT:  CLINICAL IMPRESSION: Patient is a 83 y.o. 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   Moderate dysphonia persists during today's session.  See the above treatment note for details.   OBJECTIVE IMPAIRMENTS include voice disorder. These impairments are limiting patient from effectively communicating at home and in community. Factors affecting potential to achieve goals and functional outcome are  decreased PO consumption .  Patient will benefit from skilled SLP services to address above impairments and improve overall function.  REHAB POTENTIAL: Good  PLAN: SLP FREQUENCY: 1-2x/week  SLP DURATION: 8 weeks  PLANNED INTERVENTIONS: SLP instruction and feedback and Patient/family education  Cleatus Goodin B. Rubbie, M.S., CCC-SLP, Tree Surgeon Certified Brain Injury Specialist [facility]  [facility] Office [phone] Ascom [phone] Fax [phone]

## 2023-07-14 DIAGNOSIS — R1011 Right upper quadrant pain: Secondary | ICD-10-CM | POA: Diagnosis not present

## 2023-07-14 DIAGNOSIS — E440 Moderate protein-calorie malnutrition: Secondary | ICD-10-CM | POA: Diagnosis not present

## 2023-07-15 ENCOUNTER — Other Ambulatory Visit: Payer: Self-pay | Admitting: Internal Medicine

## 2023-07-15 ENCOUNTER — Other Ambulatory Visit (HOSPITAL_COMMUNITY): Payer: Self-pay

## 2023-07-15 DIAGNOSIS — R1011 Right upper quadrant pain: Secondary | ICD-10-CM

## 2023-07-15 MED ORDER — PANCRELIPASE (LIP-PROT-AMYL) 24000-76000 UNITS PO CPEP
1.0000 | ORAL_CAPSULE | Freq: Three times a day (TID) | ORAL | 3 refills | Status: AC
  Filled 2023-07-15: qty 300, 100d supply, fill #0

## 2023-07-18 ENCOUNTER — Ambulatory Visit: Payer: HMO

## 2023-07-18 ENCOUNTER — Other Ambulatory Visit (HOSPITAL_COMMUNITY): Payer: Self-pay

## 2023-07-18 ENCOUNTER — Other Ambulatory Visit: Payer: Self-pay

## 2023-07-18 ENCOUNTER — Ambulatory Visit: Payer: HMO | Admitting: Speech Pathology

## 2023-07-18 DIAGNOSIS — R490 Dysphonia: Secondary | ICD-10-CM | POA: Diagnosis not present

## 2023-07-18 NOTE — Therapy (Signed)
 OUTPATIENT SPEECH LANGUAGE PATHOLOGY  VOICE TREATMENT NOTE RE-CERTIFICATION REQUEST   Patient Name: Jon Bray MRN: [id number] DOB:Mar 15, 1941, 83 y.o., male Today's Date: 07/18/2023  PCP: Oneil Pinal, MD REFERRING PROVIDER: Carolee Hunter, MD    End of Session - 07/18/23 1358     Visit Number 14    Number of Visits 24    Date for SLP Re-Evaluation 08/22/23    Authorization Type [employer]    Progress Note Due on Visit 20    SLP Start Time 1400    SLP Stop Time  1445    SLP Time Calculation (min) 45 min    Activity Tolerance Patient tolerated treatment well             Past Medical History:  Diagnosis Date   Anginal pain (HCC)    Aortic atherosclerosis (HCC)    Aortic root enlargement (HCC) 12/27/2017   a.) cCTA 12/27/2017: measured 3.9 cm.   Basal cell carcinoma of skin    a.) s/p Mohs procedure R/L nasal ala 01/27/2015   CAD (coronary artery disease) 05/12/2013   a.) STEMI --> LHC 05/12/2013: 20% p-mLAD, 20% D2, 40% LPDA, 30% LPAV, 99% dLCx --> PCI placing 4.5 x 20 mm and 2.5 x 12 mm Veriflex BMS to LPAV extending to dLCx. b.) cCTA 12/27/2017: Ca score 1578; 82nd percentile for age/sex match control; CT-FFR: RCA 0.99, pLAD 0.97, mLAD 0.98, dLAD 0.89, pLCx 0.98, mLCx 0.97, dLCx 0.97, PLB 0.91, PDA (most distal segment) 0.80.   Cataract    a.) s/p extraction with IOL placement   CKD (chronic kidney disease)    Diverticulosis    GERD (gastroesophageal reflux disease)    History of kidney stones 2023   HLD (hyperlipidemia)    Left lower lobe pulmonary nodule 08/01/2021   a.) CT 08/01/2021: measured 4 mm; perifissural   Long term current use of anticoagulant    a.) apixaban    NSVT (nonsustained ventricular tachycardia) (HCC) 05/13/2013   a.) 24 beat run while in ICU following PCI for STEMI   PAF (paroxysmal atrial fibrillation) (HCC) 11/16/2021   a.) new onset in ED on 11/16/2021. b.) CHA2DS2-VASc = 4 (age x 2, STEMI/aortic plaque, T2DM). c.) rate  controlled without pharmacological intervention; started on apixaban  11/16/2021, however PCP discontinued on 11/18/2021 pending Holter study results.   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   Type 2 diabetes, diet controlled [facility])    Past Surgical History:  Procedure Laterality Date   BIOPSY  12/02/2022   Procedure: BIOPSY;  Surgeon: Wilhelmenia Aloha Raddle., MD;  Location: [facility] ENDOSCOPY;  Service: Gastroenterology;;   BIOPSY  01/31/2023   Procedure: BIOPSY;  Surgeon: Federico Rosario BROCKS, MD;  Location: [facility] ENDOSCOPY;  Service: Gastroenterology;;   CATARACT EXTRACTION W/ INTRAOCULAR LENS IMPLANT Bilateral    CHOLECYSTECTOMY N/A 12/03/2022   Procedure: LAPAROSCOPIC CHOLECYSTECTOMY;  Surgeon: Signe Mitzie LABOR, MD;  Location: [facility] OR;  Service: General;  Laterality: N/A;   COLONOSCOPY N/A 01/31/2001   COLONOSCOPY N/A 11/19/2008   CORONARY ANGIOPLASTY WITH STENT PLACEMENT Left 05/12/2013   Procedure: CORONARY ANGIOPLASTY WITH STENT PLACEMENT; Location: [facility]; Surgeon: Lynwood Falls, MD   ENDOSCOPIC RETROGRADE CHOLANGIOPANCREATOGRAPHY (ERCP) WITH PROPOFOL  N/A 11/22/2022   Procedure: ENDOSCOPIC RETROGRADE CHOLANGIOPANCREATOGRAPHY (ERCP) WITH PROPOFOL ;  Surgeon:  Avram Lupita BRAVO, MD;  Location: [facility] ENDOSCOPY;  Service: Gastroenterology;  Laterality: N/A;   ERCP N/A 12/02/2022   Procedure: ENDOSCOPIC RETROGRADE CHOLANGIOPANCREATOGRAPHY (ERCP);  Surgeon: Wilhelmenia Aloha Raddle., MD;  Location: [facility] ENDOSCOPY;  Service: Gastroenterology;  Laterality: N/A;   ESOPHAGOGASTRODUODENOSCOPY N/A 03/03/2000   ESOPHAGOGASTRODUODENOSCOPY (EGD) WITH PROPOFOL  N/A 10/16/2021   Procedure: ESOPHAGOGASTRODUODENOSCOPY (EGD) WITH PROPOFOL ;  Surgeon: Maryruth Ole DASEN, MD;  Location: [facility] ENDOSCOPY;  Service:  Endoscopy;  Laterality: N/A;   ESOPHAGOGASTRODUODENOSCOPY (EGD) WITH PROPOFOL  N/A 01/31/2023   Procedure: ESOPHAGOGASTRODUODENOSCOPY (EGD) WITH PROPOFOL ;  Surgeon: Federico Rosario BROCKS, MD;  Location: [facility] ENDOSCOPY;  Service: Gastroenterology;  Laterality: N/A;   ESOPHAGOGASTRODUODENOSCOPY (EGD) WITH PROPOFOL  N/A 02/16/2023   Procedure: ESOPHAGOGASTRODUODENOSCOPY (EGD) WITH PROPOFOL ;  Surgeon: Wilhelmenia Aloha Raddle., MD;  Location: [facility] ENDOSCOPY;  Service: Gastroenterology;  Laterality: N/A;   EUS N/A 02/16/2023   Procedure: UPPER ENDOSCOPIC ULTRASOUND (EUS) LINEAR;  Surgeon: Wilhelmenia Aloha Raddle., MD;  Location: [facility] ENDOSCOPY;  Service: Gastroenterology;  Laterality: N/A;   EXTRACORPOREAL SHOCK WAVE LITHOTRIPSY Left 08/06/2021   Procedure: EXTRACORPOREAL SHOCK WAVE LITHOTRIPSY (ESWL);  Surgeon: Penne Knee, MD;  Location: [facility] ORS;  Service: Urology;  Laterality: Left;   INGUINAL HERNIA REPAIR Right 11/27/2021   Procedure: HERNIA REPAIR INGUINAL ADULT;  Surgeon: Dessa Reyes ORN, MD;  Location: [facility] ORS;  Service: General;  Laterality: Right;   INTRAOCULAR LENS EXCHANGE Left 12/27/2018   Procedure: MECHANICAL VITRECTOMY,  EXCHANGE LENS PROSTHESIS VIA PARS PLANA APPROACH; Location: [facility]; Surgeon: Diona Mana, MD   MOHS SURGERY Bilateral 01/27/2015   Procedure: MOHS SURGERY TO REMOVE BASAL CELL CARCINOMA FROM RIGHT/LEFT NASAL ALA; Location: [facility]; Surgeon: Adine Gold, MD   REMOVAL OF STONES  12/02/2022   Procedure: REMOVAL OF STONES;  Surgeon: Wilhelmenia Aloha Raddle., MD;  Location: [facility] ENDOSCOPY;  Service: Gastroenterology;;   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   Procedure: RPR RETINAL DTCHMNT W/VITRECTOMY ANY METH REPAIR RETINAL DETACHMENT; WITH VITRECTOMY, INC, WHEN PERFORMED, AIR/GAS TAMPONADE, FOCAL ENDOLASER PHOTOCOAGULATION, CRYOTHERAPY, DRAINAGE OF SUBRETINAL FLUID, SCLERAL BUCKLING AND/OR REMOVAL OF LENS; Location: [facility]; Surgeon: Diona Mana, MD   SINUSOTOMY N/A    Procedure: MAXILLARY  SINUSOTOMY INTRANASAL   SPHINCTEROTOMY  12/02/2022   Procedure: SPHINCTEROTOMY;  Surgeon: Mansouraty, Aloha Raddle., MD;  Location: [facility] ENDOSCOPY;  Service: Gastroenterology;;   TONSILLECTOMY AND ADENOIDECTOMY N/A    Patient Active Problem List   Diagnosis Date Noted   Hypokalemia 02/16/2023   Gastric outflow obstruction 02/14/2023   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   Acute recurrent pancreatitis 02/13/2023   Intractable nausea and vomiting 02/13/2023   Displaced nasogastric feeding tube 02/12/2023   Esophageal stenosis 02/12/2023   Chronic pancreatitis (HCC) 02/12/2023   Leukocytosis 02/12/2023   Essential hypertension 02/12/2023   History of CAD (coronary artery disease) 02/12/2023   Atrial fibrillation, chronic -not on coagulation (HCC) 02/12/2023   Chronic disease anemia 02/12/2023   Encounter for feeding tube placement 02/12/2023   Malnutrition of moderate degree 02/01/2023   Nausea and vomiting 01/31/2023   Abnormal CT scan 01/31/2023   Acute pancreatitis 01/28/2023   Chronic diarrhea 12/05/2022   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   Hemorrhagic pancreatitis 11/30/2022   History of ERCP 11/30/2022   Biliary obstruction 11/30/2022   Moderate protein-calorie malnutrition (HCC) 11/30/2022   Gallstone pancreatitis 11/30/2022   Choledocholithiasis 11/21/2022   Acute gallstone pancreatitis 11/17/2022   PAF (paroxysmal atrial fibrillation) (HCC) 11/16/2021   Diabetes type 2, controlled (HCC) 05/15/2018   Hyperlipidemia, mixed 10/28/2015   CAD (coronary artery disease) 05/12/2013   GERD (gastroesophageal reflux  disease) 05/12/2013    ONSET DATE:  10 months ago following multiple surgeries   REFERRING DIAG: Paralysis of vocal cords and larynx, 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 Pt with PEG placement for pancreatitis.  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 Gradual improvement over time. CT was WNL. Pt reports had repeat laryngoscopy since my last visit and was told it was stronger. Mirror exam today shows good movement and patient's symptoms are improving. Recommend continue home speech exercises.   PAIN:  Are you having pain? No   FALLS: Has patient fallen in last 6 months? No,   LIVING ENVIRONMENT: Lives with: lives with their family Lives in: House/apartment  PLOF: Independent  PATIENT GOALS    to improve vocal quality  SUBJECTIVE STATEMENT: I have an ultrasound tomorrow Pt accompanied by: self  OBJECTIVE:    TODAY'S TREATMENT SESSION:   Skilled treatment session focused on pt's dysphonia goals. SLP facilitated the session by providing the following interventions:  Pt states that he was seen by PCP for right upper quadrant pain with US  scheduled for tomorrow.   Pt able to engage in conversation with increased vocal stability although continued hoarseness. My voice is not as raspy. He also reports increased ability to project his voice and that his voice is lasting longer throughout the day. Throughout 45 minute conversation, pt with increased respiratory support and decreased vocal fatigue.    PATIENT EDUCATION: Education details: see above Person educated: Patient Education method: Explanation Education comprehension: needs further education   HOME EXERCISE PROGRAM: Pushing during phonation Increasing hydration EMST     GOALS: Goals reviewed with patient? Yes  SHORT TERM GOALS: Target date: 10 sessions  Updated 06/23/2023  Updated 07/18/2023 The patient will maximize voice quality and loudness using breath support/oral resonance for sustained vowel production, pitch glides, and  hierarchal speech drill.  Baseline: Goal status: INITIAL - MET  2.  The patient will demonstrate abdominal breathing patterns and steady release of breath on exhalation to optimize efficiency of voicing and decrease laryngeal hyperfunction.  Baseline:  Goal status: INITIAL - progress made: MET  3.  The patient will increase hydration for an eventual goal of 6-8 glasses per day and limit caffeine intake (to maximum of 1-2, 8 oz cups/day), as measured by patient report.  Baseline:  Goal status: INITIAL - progress made: stable  LONG TERM GOALS: Target date: 08/22/2023  Updated 06/23/2023  Updated 07/18/2023 The patient will participate in 5-8 minutes conversation, maintaining average loudness of 75 dB and loud, good quality voice with min cues.   Baseline:  Goal status: INITIAL - progress made, stable  2.  The patient will be independent for abdominal breathing and breath support exercises.  Baseline:  Goal status: INITIAL - MET  3.  Patient will report improved communication effectiveness as measured by PROM Baseline:  Goal status: INITIAL - progress made: stable   ASSESSMENT:  CLINICAL IMPRESSION: Patient is a 83 y.o. male who was seen today for a .voice treatment session d/t limited abduction of right vocal cord. He presents with improving mild to moderate dysphonia and will seek re-certification of ST services pending ENT follow up on 07/28/2023. See the above treatment note for details.  OBJECTIVE IMPAIRMENTS include voice disorder. These impairments are limiting patient from effectively communicating at home and in community. Factors affecting potential to achieve goals and functional outcome are  decreased PO consumption . Patient will benefit from skilled SLP services to address above impairments and improve overall function.  REHAB POTENTIAL: Good  PLAN: SLP FREQUENCY: 1-2x/week  SLP DURATION: 8 weeks  PLANNED INTERVENTIONS: SLP instruction and feedback and  Patient/family education  Havilah Topor B. Rubbie, M.S., CCC-SLP, Tree Surgeon Certified Brain Injury Specialist [facility]  [facility] Rehabilitation Services Office [phone] Ascom [phone] Fax [phone]

## 2023-07-19 ENCOUNTER — Ambulatory Visit
Admission: RE | Admit: 2023-07-19 | Discharge: 2023-07-19 | Disposition: A | Discharge status: Home / Self Care | Payer: HMO | Source: Ambulatory Visit | Attending: Internal Medicine | Admitting: Internal Medicine

## 2023-07-19 DIAGNOSIS — Z9049 Acquired absence of other specified parts of digestive tract: Secondary | ICD-10-CM | POA: Diagnosis not present

## 2023-07-19 DIAGNOSIS — R109 Unspecified abdominal pain: Secondary | ICD-10-CM | POA: Diagnosis not present

## 2023-07-19 DIAGNOSIS — R1011 Right upper quadrant pain: Secondary | ICD-10-CM | POA: Diagnosis not present

## 2023-07-20 ENCOUNTER — Ambulatory Visit: Payer: HMO | Admitting: Physical Therapy

## 2023-07-20 ENCOUNTER — Ambulatory Visit: Payer: HMO | Admitting: Speech Pathology

## 2023-07-20 ENCOUNTER — Other Ambulatory Visit: Payer: Self-pay

## 2023-07-20 DIAGNOSIS — R490 Dysphonia: Secondary | ICD-10-CM

## 2023-07-20 NOTE — Therapy (Signed)
OUTPATIENT SPEECH LANGUAGE PATHOLOGY  VOICE TREATMENT NOTE DISCHARGE SUMMARY   Patient Name: Jon Bray MRN: [id number] DOB:June 29, 1941, 83 y.o., male Today's Date: 07/20/2023  PCP: Bethann Punches, MD REFERRING PROVIDER: Bud Face, MD    End of Session - 07/20/23 1358     Visit Number 15    Number of Visits 24    Date for SLP Re-Evaluation 08/22/23    Authorization Type [employer]    Progress Note Due on Visit 20    SLP Start Time 1400    SLP Stop Time  1445    SLP Time Calculation (min) 45 min    Activity Tolerance Patient tolerated treatment well             Past Medical History:  Diagnosis Date   Anginal pain (HCC)    Aortic atherosclerosis (HCC)    Aortic root enlargement (HCC) 12/27/2017   a.) cCTA 12/27/2017: measured 3.9 cm.   Basal cell carcinoma of skin    a.) s/p Mohs procedure R/L nasal ala 01/27/2015   CAD (coronary artery disease) 05/12/2013   a.) STEMI --> LHC 05/12/2013: 20% p-mLAD, 20% D2, 40% LPDA, 30% LPAV, 99% dLCx --> PCI placing 4.5 x 20 mm and 2.5 x 12 mm Veriflex BMS to LPAV extending to dLCx. b.) cCTA 12/27/2017: Ca score 1578; 82nd percentile for age/sex match control; CT-FFR: RCA 0.99, pLAD 0.97, mLAD 0.98, dLAD 0.89, pLCx 0.98, mLCx 0.97, dLCx 0.97, PLB 0.91, PDA (most distal segment) 0.80.   Cataract    a.) s/p extraction with IOL placement   CKD (chronic kidney disease)    Diverticulosis    GERD (gastroesophageal reflux disease)    History of kidney stones 2023   HLD (hyperlipidemia)    Left lower lobe pulmonary nodule 08/01/2021   a.) CT 08/01/2021: measured 4 mm; perifissural   Long term current use of anticoagulant    a.) apixaban   NSVT (nonsustained ventricular tachycardia) (HCC) 05/13/2013   a.) 24 beat run while in ICU following PCI for STEMI   PAF (paroxysmal atrial fibrillation) (HCC) 11/16/2021   a.) new onset in ED on 11/16/2021. b.) CHA2DS2-VASc = 4 (age x 2, STEMI/aortic plaque, T2DM). c.) rate  controlled without pharmacological intervention; started on apixaban 11/16/2021, however PCP discontinued on 11/18/2021 pending Holter study results.   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   Type 2 diabetes, diet controlled [facility])    Past Surgical History:  Procedure Laterality Date   BIOPSY  12/02/2022   Procedure: BIOPSY;  Surgeon: Meridee Score Netty Starring., MD;  Location: [facility] ENDOSCOPY;  Service: Gastroenterology;;   BIOPSY  01/31/2023   Procedure: BIOPSY;  Surgeon: Imogene Burn, MD;  Location: [facility] ENDOSCOPY;  Service: Gastroenterology;;   CATARACT EXTRACTION W/ INTRAOCULAR LENS IMPLANT Bilateral    CHOLECYSTECTOMY N/A 12/03/2022   Procedure: LAPAROSCOPIC CHOLECYSTECTOMY;  Surgeon: [name], MD;  Location: [facility] OR;  Service: General;  Laterality: N/A;   COLONOSCOPY N/A 01/31/2001   COLONOSCOPY N/A 11/19/2008   CORONARY ANGIOPLASTY WITH STENT PLACEMENT Left 05/12/2013   Procedure: CORONARY ANGIOPLASTY WITH STENT PLACEMENT; Location: [facility]; Surgeon: Lieutenant Diego, MD   ENDOSCOPIC RETROGRADE CHOLANGIOPANCREATOGRAPHY (ERCP) WITH PROPOFOL N/A 11/22/2022   Procedure: ENDOSCOPIC RETROGRADE CHOLANGIOPANCREATOGRAPHY (ERCP) WITH PROPOFOL;  Surgeon:  Iva Boop, MD;  Location: [facility] ENDOSCOPY;  Service: Gastroenterology;  Laterality: N/A;   ERCP N/A 12/02/2022   Procedure: ENDOSCOPIC RETROGRADE CHOLANGIOPANCREATOGRAPHY (ERCP);  Surgeon: [name]., MD;  Location: [facility] ENDOSCOPY;  Service: Gastroenterology;  Laterality: N/A;   ESOPHAGOGASTRODUODENOSCOPY N/A 03/03/2000   ESOPHAGOGASTRODUODENOSCOPY (EGD) WITH PROPOFOL N/A 10/16/2021   Procedure: ESOPHAGOGASTRODUODENOSCOPY (EGD) WITH PROPOFOL;  Surgeon: Regis Bill, MD;  Location: [facility] ENDOSCOPY;  Service:  Endoscopy;  Laterality: N/A;   ESOPHAGOGASTRODUODENOSCOPY (EGD) WITH PROPOFOL N/A 01/31/2023   Procedure: ESOPHAGOGASTRODUODENOSCOPY (EGD) WITH PROPOFOL;  Surgeon: Imogene Burn, MD;  Location: [facility] ENDOSCOPY;  Service: Gastroenterology;  Laterality: N/A;   ESOPHAGOGASTRODUODENOSCOPY (EGD) WITH PROPOFOL N/A 02/16/2023   Procedure: ESOPHAGOGASTRODUODENOSCOPY (EGD) WITH PROPOFOL;  Surgeon: Meridee Score Netty Starring., MD;  Location: [facility] ENDOSCOPY;  Service: Gastroenterology;  Laterality: N/A;   EUS N/A 02/16/2023   Procedure: UPPER ENDOSCOPIC ULTRASOUND (EUS) LINEAR;  Surgeon: [name]., MD;  Location: [facility] ENDOSCOPY;  Service: Gastroenterology;  Laterality: N/A;   EXTRACORPOREAL SHOCK WAVE LITHOTRIPSY Left 08/06/2021   Procedure: EXTRACORPOREAL SHOCK WAVE LITHOTRIPSY (ESWL);  Surgeon: Vanna Scotland, MD;  Location: [facility] ORS;  Service: Urology;  Laterality: Left;   INGUINAL HERNIA REPAIR Right 11/27/2021   Procedure: HERNIA REPAIR INGUINAL ADULT;  Surgeon: Earline Mayotte, MD;  Location: [facility] ORS;  Service: General;  Laterality: Right;   INTRAOCULAR LENS EXCHANGE Left 12/27/2018   Procedure: MECHANICAL VITRECTOMY,  EXCHANGE LENS PROSTHESIS VIA PARS PLANA APPROACH; Location: [facility]; Surgeon: Nicholaus Corolla, MD   MOHS SURGERY Bilateral 01/27/2015   Procedure: MOHS SURGERY TO REMOVE BASAL CELL CARCINOMA FROM RIGHT/LEFT NASAL ALA; Location: [facility]; Surgeon: Katrine Coho, MD   REMOVAL OF STONES  12/02/2022   Procedure: REMOVAL OF STONES;  Surgeon: Meridee Score Netty Starring., MD;  Location: [facility] ENDOSCOPY;  Service: Gastroenterology;;   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   Procedure: RPR RETINAL DTCHMNT W/VITRECTOMY ANY METH REPAIR RETINAL DETACHMENT; WITH VITRECTOMY, INC, WHEN PERFORMED, AIR/GAS TAMPONADE, FOCAL ENDOLASER PHOTOCOAGULATION, CRYOTHERAPY, DRAINAGE OF SUBRETINAL FLUID, SCLERAL BUCKLING AND/OR REMOVAL OF LENS; Location: [facility]; Surgeon: Nicholaus Corolla, MD   SINUSOTOMY N/A    Procedure: MAXILLARY  SINUSOTOMY INTRANASAL   SPHINCTEROTOMY  12/02/2022   Procedure: SPHINCTEROTOMY;  Surgeon: Mansouraty, Netty Starring., MD;  Location: [facility] ENDOSCOPY;  Service: Gastroenterology;;   TONSILLECTOMY AND ADENOIDECTOMY N/A    Patient Active Problem List   Diagnosis Date Noted   Hypokalemia 02/16/2023   Gastric outflow obstruction 02/14/2023   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   Acute recurrent pancreatitis 02/13/2023   Intractable nausea and vomiting 02/13/2023   Displaced nasogastric feeding tube 02/12/2023   Esophageal stenosis 02/12/2023   Chronic pancreatitis (HCC) 02/12/2023   Leukocytosis 02/12/2023   Essential hypertension 02/12/2023   History of CAD (coronary artery disease) 02/12/2023   Atrial fibrillation, chronic -not on coagulation (HCC) 02/12/2023   Chronic disease anemia 02/12/2023   Encounter for feeding tube placement 02/12/2023   Malnutrition of moderate degree 02/01/2023   Nausea and vomiting 01/31/2023   Abnormal CT scan 01/31/2023   Acute pancreatitis 01/28/2023   Chronic diarrhea 12/05/2022   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   Hemorrhagic pancreatitis 11/30/2022   History of ERCP 11/30/2022   Biliary obstruction 11/30/2022   Moderate protein-calorie malnutrition (HCC) 11/30/2022   Gallstone pancreatitis 11/30/2022   Choledocholithiasis 11/21/2022   Acute gallstone pancreatitis 11/17/2022   PAF (paroxysmal atrial fibrillation) (HCC) 11/16/2021   Diabetes type 2, controlled (HCC) 05/15/2018   Hyperlipidemia, mixed 10/28/2015   CAD (coronary artery disease) 05/12/2013   GERD (gastroesophageal reflux  disease) 05/12/2013    ONSET DATE:  10 months ago following multiple surgeries   REFERRING DIAG: Paralysis of vocal cords and larynx, 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 Pt with PEG placement for pancreatitis.  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 Gradual improvement over time. CT was WNL. Pt reports had repeat laryngoscopy since my last visit and was told it was stronger. Mirror exam today shows good movement and patient's symptoms are improving. Recommend continue home speech exercises.   PAIN:  Are you having pain? No   FALLS: Has patient fallen in last 6 months? No,   LIVING ENVIRONMENT: Lives with: lives with their family Lives in: House/apartment  PLOF: Independent  PATIENT GOALS    to improve vocal quality  SUBJECTIVE STATEMENT: Pt reports that his ultrasound was clear Pt accompanied by: self  OBJECTIVE:    TODAY'S TREATMENT SESSION:   Skilled treatment session focused on pt's dysphonia goals. SLP facilitated the session by providing the following interventions:  Pt reports continued completion of vocal cord adduction exercises as well as return to gym for overall increased endurance. While pt's overall vocal quality presented as "stronger" during today's session, his vocal quality continues to present with breathy, raspy and hoarse vocal quality. This continues to remain stable despite intervention. Will discharge pt pending further ENT evaluation on 07/28/2023.    PATIENT EDUCATION: Education details: see above Person educated: Patient Education method: Explanation Education comprehension: needs further education   HOME EXERCISE PROGRAM: Pushing during phonation Increasing hydration EMST     GOALS: Goals reviewed with patient? Yes  SHORT TERM GOALS: Target date: 10 sessions  Updated 06/23/2023  Updated 07/18/2023 The patient will maximize voice quality and loudness using breath support/oral resonance for sustained vowel production, pitch glides, and hierarchal speech drill.   Baseline: Goal status: INITIAL - MET  2.  The patient will demonstrate abdominal breathing patterns and steady release of breath on exhalation to optimize efficiency of voicing and decrease laryngeal hyperfunction.  Baseline:  Goal status: INITIAL - progress made: MET  3.  The patient will increase hydration for an eventual goal of 6-8 glasses per day and limit caffeine intake (to maximum of 1-2, 8 oz cups/day), as measured by patient report.  Baseline:  Goal status: INITIAL - progress made: stable: MET  LONG TERM GOALS: Target date: 08/22/2023  Updated 06/23/2023  Updated 07/18/2023 The patient will participate in 5-8 minutes conversation, maintaining average loudness of 75 dB and loud, good quality voice with min cues.   Baseline:  Goal status: INITIAL - progress made, stable: MET  2.  The patient will be independent for abdominal breathing and breath support exercises.  Baseline:  Goal status: INITIAL - MET  3.  Patient will report improved communication effectiveness as measured by PROM Baseline:  Goal status: INITIAL - progress made: stable: MET   ASSESSMENT:  CLINICAL IMPRESSION: Patient is a 83 y.o. male who was seen today for a .voice treatment session d/t limited abduction of right vocal cord.   See the above treatment note for details regarding progress and POC.    PLAN: Pt is appropriate for discharge from ST sessions d/t progress made and plan for pt to re-evaluated by  ENT to assess need/plan for any future services.   Ashtin Melichar B. Dreama Saa, M.S., CCC-SLP, Tree surgeon Certified Brain Injury Specialist [facility]  [facility] Rehabilitation Services Office [phone] Ascom [phone] Fax [phone]

## 2023-07-22 ENCOUNTER — Other Ambulatory Visit: Payer: Self-pay | Admitting: Internal Medicine

## 2023-07-22 DIAGNOSIS — R1011 Right upper quadrant pain: Secondary | ICD-10-CM

## 2023-07-22 DIAGNOSIS — K861 Other chronic pancreatitis: Secondary | ICD-10-CM

## 2023-07-25 ENCOUNTER — Ambulatory Visit: Payer: HMO | Admitting: Physical Therapy

## 2023-07-25 ENCOUNTER — Ambulatory Visit: Payer: HMO | Admitting: Speech Pathology

## 2023-07-26 ENCOUNTER — Ambulatory Visit
Admission: RE | Admit: 2023-07-26 | Discharge: 2023-07-26 | Disposition: A | Discharge status: Home / Self Care | Payer: HMO | Source: Ambulatory Visit | Attending: Internal Medicine | Admitting: Internal Medicine

## 2023-07-26 DIAGNOSIS — R1011 Right upper quadrant pain: Secondary | ICD-10-CM | POA: Diagnosis not present

## 2023-07-26 DIAGNOSIS — R109 Unspecified abdominal pain: Secondary | ICD-10-CM | POA: Diagnosis not present

## 2023-07-26 DIAGNOSIS — K5730 Diverticulosis of large intestine without perforation or abscess without bleeding: Secondary | ICD-10-CM | POA: Diagnosis not present

## 2023-07-26 DIAGNOSIS — K861 Other chronic pancreatitis: Secondary | ICD-10-CM | POA: Insufficient documentation

## 2023-07-26 DIAGNOSIS — E440 Moderate protein-calorie malnutrition: Secondary | ICD-10-CM | POA: Diagnosis not present

## 2023-07-26 DIAGNOSIS — K863 Pseudocyst of pancreas: Secondary | ICD-10-CM | POA: Diagnosis not present

## 2023-07-27 ENCOUNTER — Ambulatory Visit: Payer: HMO | Admitting: Physical Therapy

## 2023-07-27 ENCOUNTER — Ambulatory Visit: Payer: HMO | Admitting: Speech Pathology

## 2023-07-28 DIAGNOSIS — H6123 Impacted cerumen, bilateral: Secondary | ICD-10-CM | POA: Diagnosis not present

## 2023-07-28 DIAGNOSIS — J3801 Paralysis of vocal cords and larynx, unilateral: Secondary | ICD-10-CM | POA: Diagnosis not present

## 2023-07-28 DIAGNOSIS — H6063 Unspecified chronic otitis externa, bilateral: Secondary | ICD-10-CM | POA: Diagnosis not present

## 2023-07-28 DIAGNOSIS — R490 Dysphonia: Secondary | ICD-10-CM | POA: Diagnosis not present

## 2023-07-29 DIAGNOSIS — L814 Other melanin hyperpigmentation: Secondary | ICD-10-CM | POA: Diagnosis not present

## 2023-07-29 DIAGNOSIS — D0439 Carcinoma in situ of skin of other parts of face: Secondary | ICD-10-CM | POA: Diagnosis not present

## 2023-07-29 DIAGNOSIS — L570 Actinic keratosis: Secondary | ICD-10-CM | POA: Diagnosis not present

## 2023-07-29 DIAGNOSIS — L988 Other specified disorders of the skin and subcutaneous tissue: Secondary | ICD-10-CM | POA: Diagnosis not present

## 2023-07-29 DIAGNOSIS — L578 Other skin changes due to chronic exposure to nonionizing radiation: Secondary | ICD-10-CM | POA: Diagnosis not present

## 2023-08-01 ENCOUNTER — Ambulatory Visit: Payer: HMO | Admitting: Physical Therapy

## 2023-08-01 ENCOUNTER — Ambulatory Visit: Payer: HMO | Admitting: Speech Pathology

## 2023-08-03 ENCOUNTER — Ambulatory Visit: Payer: HMO | Admitting: Physical Therapy

## 2023-08-03 ENCOUNTER — Ambulatory Visit: Payer: HMO | Admitting: Speech Pathology

## 2023-08-08 ENCOUNTER — Ambulatory Visit: Payer: HMO | Admitting: Physical Therapy

## 2023-08-08 ENCOUNTER — Ambulatory Visit: Payer: HMO | Admitting: Speech Pathology

## 2023-08-09 ENCOUNTER — Ambulatory Visit: Payer: HMO | Admitting: Physical Therapy

## 2023-08-10 ENCOUNTER — Ambulatory Visit: Payer: HMO | Admitting: Speech Pathology

## 2023-08-10 ENCOUNTER — Ambulatory Visit: Payer: HMO | Admitting: Physical Therapy

## 2023-08-10 DIAGNOSIS — C44329 Squamous cell carcinoma of skin of other parts of face: Secondary | ICD-10-CM | POA: Diagnosis not present

## 2023-08-10 DIAGNOSIS — C44222 Squamous cell carcinoma of skin of right ear and external auricular canal: Secondary | ICD-10-CM | POA: Diagnosis not present

## 2023-08-10 DIAGNOSIS — D045 Carcinoma in situ of skin of trunk: Secondary | ICD-10-CM | POA: Diagnosis not present

## 2023-08-10 DIAGNOSIS — L820 Inflamed seborrheic keratosis: Secondary | ICD-10-CM | POA: Diagnosis not present

## 2023-08-10 DIAGNOSIS — R208 Other disturbances of skin sensation: Secondary | ICD-10-CM | POA: Diagnosis not present

## 2023-08-10 DIAGNOSIS — C44321 Squamous cell carcinoma of skin of nose: Secondary | ICD-10-CM | POA: Diagnosis not present

## 2023-08-10 DIAGNOSIS — L570 Actinic keratosis: Secondary | ICD-10-CM | POA: Diagnosis not present

## 2023-08-10 DIAGNOSIS — L538 Other specified erythematous conditions: Secondary | ICD-10-CM | POA: Diagnosis not present

## 2023-08-10 DIAGNOSIS — L2989 Other pruritus: Secondary | ICD-10-CM | POA: Diagnosis not present

## 2023-08-11 ENCOUNTER — Ambulatory Visit: Payer: HMO | Admitting: Physical Therapy

## 2023-08-15 ENCOUNTER — Ambulatory Visit: Payer: HMO | Admitting: Physical Therapy

## 2023-08-15 ENCOUNTER — Ambulatory Visit: Payer: HMO | Admitting: Speech Pathology

## 2023-08-16 ENCOUNTER — Ambulatory Visit: Payer: HMO | Admitting: Physical Therapy

## 2023-08-17 ENCOUNTER — Ambulatory Visit: Payer: HMO | Admitting: Speech Pathology

## 2023-08-17 ENCOUNTER — Ambulatory Visit: Payer: HMO | Admitting: Physical Therapy

## 2023-08-18 ENCOUNTER — Ambulatory Visit: Payer: HMO | Admitting: Physical Therapy

## 2023-08-22 ENCOUNTER — Encounter: Payer: HMO | Admitting: Speech Pathology

## 2023-08-22 ENCOUNTER — Ambulatory Visit: Payer: HMO | Admitting: Physical Therapy

## 2023-08-26 DIAGNOSIS — E1151 Type 2 diabetes mellitus with diabetic peripheral angiopathy without gangrene: Secondary | ICD-10-CM | POA: Diagnosis not present

## 2023-08-26 DIAGNOSIS — I482 Chronic atrial fibrillation, unspecified: Secondary | ICD-10-CM | POA: Diagnosis not present

## 2023-08-26 DIAGNOSIS — E440 Moderate protein-calorie malnutrition: Secondary | ICD-10-CM | POA: Diagnosis not present

## 2023-08-27 DIAGNOSIS — M545 Low back pain, unspecified: Secondary | ICD-10-CM | POA: Diagnosis not present

## 2023-08-29 ENCOUNTER — Encounter: Payer: HMO | Admitting: Speech Pathology

## 2023-08-29 ENCOUNTER — Ambulatory Visit: Payer: HMO | Admitting: Physical Therapy

## 2023-08-29 DIAGNOSIS — M5416 Radiculopathy, lumbar region: Secondary | ICD-10-CM | POA: Diagnosis not present

## 2023-08-29 DIAGNOSIS — M545 Low back pain, unspecified: Secondary | ICD-10-CM | POA: Diagnosis not present

## 2023-08-31 ENCOUNTER — Encounter: Payer: HMO | Admitting: Speech Pathology

## 2023-08-31 ENCOUNTER — Ambulatory Visit: Payer: HMO | Admitting: Physical Therapy

## 2023-08-31 DIAGNOSIS — K8591 Acute pancreatitis with uninfected necrosis, unspecified: Secondary | ICD-10-CM | POA: Diagnosis not present

## 2023-08-31 DIAGNOSIS — M5116 Intervertebral disc disorders with radiculopathy, lumbar region: Secondary | ICD-10-CM | POA: Diagnosis not present

## 2023-09-05 ENCOUNTER — Ambulatory Visit: Payer: HMO | Admitting: Physical Therapy

## 2023-09-05 ENCOUNTER — Encounter: Payer: HMO | Admitting: Speech Pathology

## 2023-09-05 DIAGNOSIS — M9901 Segmental and somatic dysfunction of cervical region: Secondary | ICD-10-CM | POA: Diagnosis not present

## 2023-09-05 DIAGNOSIS — M5136 Other intervertebral disc degeneration, lumbar region with discogenic back pain only: Secondary | ICD-10-CM | POA: Diagnosis not present

## 2023-09-05 DIAGNOSIS — M9903 Segmental and somatic dysfunction of lumbar region: Secondary | ICD-10-CM | POA: Diagnosis not present

## 2023-09-05 DIAGNOSIS — M62830 Muscle spasm of back: Secondary | ICD-10-CM | POA: Diagnosis not present

## 2023-09-06 DIAGNOSIS — M9901 Segmental and somatic dysfunction of cervical region: Secondary | ICD-10-CM | POA: Diagnosis not present

## 2023-09-06 DIAGNOSIS — M62830 Muscle spasm of back: Secondary | ICD-10-CM | POA: Diagnosis not present

## 2023-09-06 DIAGNOSIS — M9903 Segmental and somatic dysfunction of lumbar region: Secondary | ICD-10-CM | POA: Diagnosis not present

## 2023-09-06 DIAGNOSIS — M5136 Other intervertebral disc degeneration, lumbar region with discogenic back pain only: Secondary | ICD-10-CM | POA: Diagnosis not present

## 2023-09-07 ENCOUNTER — Encounter: Payer: HMO | Admitting: Speech Pathology

## 2023-09-07 ENCOUNTER — Ambulatory Visit: Payer: HMO | Admitting: Physical Therapy

## 2023-09-07 DIAGNOSIS — M9903 Segmental and somatic dysfunction of lumbar region: Secondary | ICD-10-CM | POA: Diagnosis not present

## 2023-09-07 DIAGNOSIS — M9901 Segmental and somatic dysfunction of cervical region: Secondary | ICD-10-CM | POA: Diagnosis not present

## 2023-09-07 DIAGNOSIS — M5136 Other intervertebral disc degeneration, lumbar region with discogenic back pain only: Secondary | ICD-10-CM | POA: Diagnosis not present

## 2023-09-07 DIAGNOSIS — M62830 Muscle spasm of back: Secondary | ICD-10-CM | POA: Diagnosis not present

## 2023-09-09 DIAGNOSIS — M62830 Muscle spasm of back: Secondary | ICD-10-CM | POA: Diagnosis not present

## 2023-09-09 DIAGNOSIS — M5136 Other intervertebral disc degeneration, lumbar region with discogenic back pain only: Secondary | ICD-10-CM | POA: Diagnosis not present

## 2023-09-09 DIAGNOSIS — M9903 Segmental and somatic dysfunction of lumbar region: Secondary | ICD-10-CM | POA: Diagnosis not present

## 2023-09-09 DIAGNOSIS — M9901 Segmental and somatic dysfunction of cervical region: Secondary | ICD-10-CM | POA: Diagnosis not present

## 2023-09-12 ENCOUNTER — Encounter: Payer: HMO | Admitting: Speech Pathology

## 2023-09-12 ENCOUNTER — Ambulatory Visit: Payer: HMO | Admitting: Physical Therapy

## 2023-09-12 DIAGNOSIS — M9901 Segmental and somatic dysfunction of cervical region: Secondary | ICD-10-CM | POA: Diagnosis not present

## 2023-09-12 DIAGNOSIS — M9903 Segmental and somatic dysfunction of lumbar region: Secondary | ICD-10-CM | POA: Diagnosis not present

## 2023-09-12 DIAGNOSIS — M5136 Other intervertebral disc degeneration, lumbar region with discogenic back pain only: Secondary | ICD-10-CM | POA: Diagnosis not present

## 2023-09-12 DIAGNOSIS — M62830 Muscle spasm of back: Secondary | ICD-10-CM | POA: Diagnosis not present

## 2023-09-14 ENCOUNTER — Encounter: Payer: HMO | Admitting: Speech Pathology

## 2023-09-14 ENCOUNTER — Ambulatory Visit: Payer: HMO | Admitting: Physical Therapy

## 2023-09-14 DIAGNOSIS — M5136 Other intervertebral disc degeneration, lumbar region with discogenic back pain only: Secondary | ICD-10-CM | POA: Diagnosis not present

## 2023-09-14 DIAGNOSIS — M9901 Segmental and somatic dysfunction of cervical region: Secondary | ICD-10-CM | POA: Diagnosis not present

## 2023-09-14 DIAGNOSIS — M9903 Segmental and somatic dysfunction of lumbar region: Secondary | ICD-10-CM | POA: Diagnosis not present

## 2023-09-14 DIAGNOSIS — M62830 Muscle spasm of back: Secondary | ICD-10-CM | POA: Diagnosis not present

## 2023-09-16 DIAGNOSIS — E538 Deficiency of other specified B group vitamins: Secondary | ICD-10-CM | POA: Diagnosis not present

## 2023-09-16 DIAGNOSIS — E1151 Type 2 diabetes mellitus with diabetic peripheral angiopathy without gangrene: Secondary | ICD-10-CM | POA: Diagnosis not present

## 2023-09-19 ENCOUNTER — Encounter: Payer: HMO | Admitting: Speech Pathology

## 2023-09-19 ENCOUNTER — Ambulatory Visit: Payer: HMO | Admitting: Physical Therapy

## 2023-09-19 DIAGNOSIS — M5136 Other intervertebral disc degeneration, lumbar region with discogenic back pain only: Secondary | ICD-10-CM | POA: Diagnosis not present

## 2023-09-19 DIAGNOSIS — M9901 Segmental and somatic dysfunction of cervical region: Secondary | ICD-10-CM | POA: Diagnosis not present

## 2023-09-19 DIAGNOSIS — M9903 Segmental and somatic dysfunction of lumbar region: Secondary | ICD-10-CM | POA: Diagnosis not present

## 2023-09-19 DIAGNOSIS — M62830 Muscle spasm of back: Secondary | ICD-10-CM | POA: Diagnosis not present

## 2023-09-21 ENCOUNTER — Ambulatory Visit: Payer: HMO | Admitting: Physical Therapy

## 2023-09-21 ENCOUNTER — Encounter: Payer: HMO | Admitting: Speech Pathology

## 2023-09-22 DIAGNOSIS — M5136 Other intervertebral disc degeneration, lumbar region with discogenic back pain only: Secondary | ICD-10-CM | POA: Diagnosis not present

## 2023-09-22 DIAGNOSIS — M62830 Muscle spasm of back: Secondary | ICD-10-CM | POA: Diagnosis not present

## 2023-09-22 DIAGNOSIS — M9903 Segmental and somatic dysfunction of lumbar region: Secondary | ICD-10-CM | POA: Diagnosis not present

## 2023-09-22 DIAGNOSIS — M9901 Segmental and somatic dysfunction of cervical region: Secondary | ICD-10-CM | POA: Diagnosis not present

## 2023-09-23 DIAGNOSIS — K8591 Acute pancreatitis with uninfected necrosis, unspecified: Secondary | ICD-10-CM | POA: Diagnosis not present

## 2023-09-23 DIAGNOSIS — E1151 Type 2 diabetes mellitus with diabetic peripheral angiopathy without gangrene: Secondary | ICD-10-CM | POA: Diagnosis not present

## 2023-09-26 ENCOUNTER — Encounter: Payer: HMO | Admitting: Speech Pathology

## 2023-09-26 ENCOUNTER — Ambulatory Visit: Payer: HMO | Admitting: Physical Therapy

## 2023-10-03 ENCOUNTER — Encounter: Payer: HMO | Admitting: Speech Pathology

## 2023-10-04 DIAGNOSIS — Z85828 Personal history of other malignant neoplasm of skin: Secondary | ICD-10-CM | POA: Diagnosis not present

## 2023-10-04 DIAGNOSIS — L570 Actinic keratosis: Secondary | ICD-10-CM | POA: Diagnosis not present

## 2023-10-10 ENCOUNTER — Encounter: Payer: HMO | Admitting: Speech Pathology

## 2023-10-10 DIAGNOSIS — I2510 Atherosclerotic heart disease of native coronary artery without angina pectoris: Secondary | ICD-10-CM | POA: Diagnosis not present

## 2023-10-10 DIAGNOSIS — E782 Mixed hyperlipidemia: Secondary | ICD-10-CM | POA: Diagnosis not present

## 2023-10-10 DIAGNOSIS — K8591 Acute pancreatitis with uninfected necrosis, unspecified: Secondary | ICD-10-CM | POA: Diagnosis not present

## 2023-10-10 DIAGNOSIS — I482 Chronic atrial fibrillation, unspecified: Secondary | ICD-10-CM | POA: Diagnosis not present

## 2023-10-10 DIAGNOSIS — E1151 Type 2 diabetes mellitus with diabetic peripheral angiopathy without gangrene: Secondary | ICD-10-CM | POA: Diagnosis not present

## 2023-10-17 ENCOUNTER — Encounter: Payer: HMO | Admitting: Speech Pathology

## 2023-10-18 DIAGNOSIS — M9901 Segmental and somatic dysfunction of cervical region: Secondary | ICD-10-CM | POA: Diagnosis not present

## 2023-10-18 DIAGNOSIS — M9903 Segmental and somatic dysfunction of lumbar region: Secondary | ICD-10-CM | POA: Diagnosis not present

## 2023-10-18 DIAGNOSIS — M62830 Muscle spasm of back: Secondary | ICD-10-CM | POA: Diagnosis not present

## 2023-10-18 DIAGNOSIS — M5136 Other intervertebral disc degeneration, lumbar region with discogenic back pain only: Secondary | ICD-10-CM | POA: Diagnosis not present

## 2023-10-24 ENCOUNTER — Encounter: Payer: HMO | Admitting: Speech Pathology

## 2023-10-31 ENCOUNTER — Encounter: Payer: HMO | Admitting: Speech Pathology

## 2023-11-07 ENCOUNTER — Encounter: Payer: HMO | Admitting: Speech Pathology

## 2023-11-14 ENCOUNTER — Encounter: Payer: HMO | Admitting: Speech Pathology

## 2023-11-15 DIAGNOSIS — E782 Mixed hyperlipidemia: Secondary | ICD-10-CM | POA: Diagnosis not present

## 2023-11-15 DIAGNOSIS — M5136 Other intervertebral disc degeneration, lumbar region with discogenic back pain only: Secondary | ICD-10-CM | POA: Diagnosis not present

## 2023-11-15 DIAGNOSIS — M62830 Muscle spasm of back: Secondary | ICD-10-CM | POA: Diagnosis not present

## 2023-11-15 DIAGNOSIS — E538 Deficiency of other specified B group vitamins: Secondary | ICD-10-CM | POA: Diagnosis not present

## 2023-11-15 DIAGNOSIS — E1151 Type 2 diabetes mellitus with diabetic peripheral angiopathy without gangrene: Secondary | ICD-10-CM | POA: Diagnosis not present

## 2023-11-15 DIAGNOSIS — M9901 Segmental and somatic dysfunction of cervical region: Secondary | ICD-10-CM | POA: Diagnosis not present

## 2023-11-15 DIAGNOSIS — M9903 Segmental and somatic dysfunction of lumbar region: Secondary | ICD-10-CM | POA: Diagnosis not present

## 2023-11-22 DIAGNOSIS — I482 Chronic atrial fibrillation, unspecified: Secondary | ICD-10-CM | POA: Diagnosis not present

## 2023-11-22 DIAGNOSIS — Z125 Encounter for screening for malignant neoplasm of prostate: Secondary | ICD-10-CM | POA: Diagnosis not present

## 2023-11-22 DIAGNOSIS — Z79899 Other long term (current) drug therapy: Secondary | ICD-10-CM | POA: Diagnosis not present

## 2023-11-22 DIAGNOSIS — E1151 Type 2 diabetes mellitus with diabetic peripheral angiopathy without gangrene: Secondary | ICD-10-CM | POA: Diagnosis not present

## 2023-11-22 DIAGNOSIS — Z0000 Encounter for general adult medical examination without abnormal findings: Secondary | ICD-10-CM | POA: Diagnosis not present

## 2023-12-05 DIAGNOSIS — R600 Localized edema: Secondary | ICD-10-CM | POA: Diagnosis not present

## 2023-12-05 DIAGNOSIS — I482 Chronic atrial fibrillation, unspecified: Secondary | ICD-10-CM | POA: Diagnosis not present

## 2023-12-13 DIAGNOSIS — Z961 Presence of intraocular lens: Secondary | ICD-10-CM | POA: Diagnosis not present

## 2023-12-13 DIAGNOSIS — E119 Type 2 diabetes mellitus without complications: Secondary | ICD-10-CM | POA: Diagnosis not present

## 2023-12-13 DIAGNOSIS — M9903 Segmental and somatic dysfunction of lumbar region: Secondary | ICD-10-CM | POA: Diagnosis not present

## 2023-12-13 DIAGNOSIS — M5136 Other intervertebral disc degeneration, lumbar region with discogenic back pain only: Secondary | ICD-10-CM | POA: Diagnosis not present

## 2023-12-13 DIAGNOSIS — M62830 Muscle spasm of back: Secondary | ICD-10-CM | POA: Diagnosis not present

## 2023-12-13 DIAGNOSIS — M9901 Segmental and somatic dysfunction of cervical region: Secondary | ICD-10-CM | POA: Diagnosis not present

## 2024-01-09 DIAGNOSIS — C4441 Basal cell carcinoma of skin of scalp and neck: Secondary | ICD-10-CM | POA: Diagnosis not present

## 2024-01-09 DIAGNOSIS — D485 Neoplasm of uncertain behavior of skin: Secondary | ICD-10-CM | POA: Diagnosis not present

## 2024-01-09 DIAGNOSIS — D2271 Melanocytic nevi of right lower limb, including hip: Secondary | ICD-10-CM | POA: Diagnosis not present

## 2024-01-09 DIAGNOSIS — E1151 Type 2 diabetes mellitus with diabetic peripheral angiopathy without gangrene: Secondary | ICD-10-CM | POA: Diagnosis not present

## 2024-01-09 DIAGNOSIS — L821 Other seborrheic keratosis: Secondary | ICD-10-CM | POA: Diagnosis not present

## 2024-01-09 DIAGNOSIS — D2261 Melanocytic nevi of right upper limb, including shoulder: Secondary | ICD-10-CM | POA: Diagnosis not present

## 2024-01-09 DIAGNOSIS — C44329 Squamous cell carcinoma of skin of other parts of face: Secondary | ICD-10-CM | POA: Diagnosis not present

## 2024-01-09 DIAGNOSIS — D2262 Melanocytic nevi of left upper limb, including shoulder: Secondary | ICD-10-CM | POA: Diagnosis not present

## 2024-01-09 DIAGNOSIS — L570 Actinic keratosis: Secondary | ICD-10-CM | POA: Diagnosis not present

## 2024-01-09 DIAGNOSIS — C44529 Squamous cell carcinoma of skin of other part of trunk: Secondary | ICD-10-CM | POA: Diagnosis not present

## 2024-01-09 DIAGNOSIS — C44622 Squamous cell carcinoma of skin of right upper limb, including shoulder: Secondary | ICD-10-CM | POA: Diagnosis not present

## 2024-01-09 DIAGNOSIS — D225 Melanocytic nevi of trunk: Secondary | ICD-10-CM | POA: Diagnosis not present

## 2024-01-09 DIAGNOSIS — G5793 Unspecified mononeuropathy of bilateral lower limbs: Secondary | ICD-10-CM | POA: Diagnosis not present

## 2024-01-09 DIAGNOSIS — D2272 Melanocytic nevi of left lower limb, including hip: Secondary | ICD-10-CM | POA: Diagnosis not present

## 2024-01-10 DIAGNOSIS — M62830 Muscle spasm of back: Secondary | ICD-10-CM | POA: Diagnosis not present

## 2024-01-10 DIAGNOSIS — M5136 Other intervertebral disc degeneration, lumbar region with discogenic back pain only: Secondary | ICD-10-CM | POA: Diagnosis not present

## 2024-01-10 DIAGNOSIS — M9903 Segmental and somatic dysfunction of lumbar region: Secondary | ICD-10-CM | POA: Diagnosis not present

## 2024-01-10 DIAGNOSIS — M9901 Segmental and somatic dysfunction of cervical region: Secondary | ICD-10-CM | POA: Diagnosis not present

## 2024-01-24 DIAGNOSIS — E119 Type 2 diabetes mellitus without complications: Secondary | ICD-10-CM | POA: Diagnosis not present

## 2024-01-24 DIAGNOSIS — Z961 Presence of intraocular lens: Secondary | ICD-10-CM | POA: Diagnosis not present

## 2024-01-26 DIAGNOSIS — C44329 Squamous cell carcinoma of skin of other parts of face: Secondary | ICD-10-CM | POA: Diagnosis not present

## 2024-01-27 DIAGNOSIS — C44622 Squamous cell carcinoma of skin of right upper limb, including shoulder: Secondary | ICD-10-CM | POA: Diagnosis not present

## 2024-02-03 DIAGNOSIS — C44529 Squamous cell carcinoma of skin of other part of trunk: Secondary | ICD-10-CM | POA: Diagnosis not present

## 2024-02-03 DIAGNOSIS — C4441 Basal cell carcinoma of skin of scalp and neck: Secondary | ICD-10-CM | POA: Diagnosis not present

## 2024-02-07 DIAGNOSIS — M9901 Segmental and somatic dysfunction of cervical region: Secondary | ICD-10-CM | POA: Diagnosis not present

## 2024-02-07 DIAGNOSIS — M62830 Muscle spasm of back: Secondary | ICD-10-CM | POA: Diagnosis not present

## 2024-02-07 DIAGNOSIS — M5136 Other intervertebral disc degeneration, lumbar region with discogenic back pain only: Secondary | ICD-10-CM | POA: Diagnosis not present

## 2024-02-07 DIAGNOSIS — M9903 Segmental and somatic dysfunction of lumbar region: Secondary | ICD-10-CM | POA: Diagnosis not present

## 2024-02-16 DIAGNOSIS — J3801 Paralysis of vocal cords and larynx, unilateral: Secondary | ICD-10-CM | POA: Diagnosis not present

## 2024-02-16 DIAGNOSIS — H6123 Impacted cerumen, bilateral: Secondary | ICD-10-CM | POA: Diagnosis not present

## 2024-02-16 DIAGNOSIS — C434 Malignant melanoma of scalp and neck: Secondary | ICD-10-CM | POA: Diagnosis not present

## 2024-02-16 DIAGNOSIS — R490 Dysphonia: Secondary | ICD-10-CM | POA: Diagnosis not present

## 2024-02-16 DIAGNOSIS — H6063 Unspecified chronic otitis externa, bilateral: Secondary | ICD-10-CM | POA: Diagnosis not present

## 2024-02-23 DIAGNOSIS — M5136 Other intervertebral disc degeneration, lumbar region with discogenic back pain only: Secondary | ICD-10-CM | POA: Diagnosis not present

## 2024-02-23 DIAGNOSIS — M9903 Segmental and somatic dysfunction of lumbar region: Secondary | ICD-10-CM | POA: Diagnosis not present

## 2024-02-23 DIAGNOSIS — M9901 Segmental and somatic dysfunction of cervical region: Secondary | ICD-10-CM | POA: Diagnosis not present

## 2024-02-23 DIAGNOSIS — M62830 Muscle spasm of back: Secondary | ICD-10-CM | POA: Diagnosis not present

## 2024-03-06 DIAGNOSIS — M62830 Muscle spasm of back: Secondary | ICD-10-CM | POA: Diagnosis not present

## 2024-03-06 DIAGNOSIS — M5136 Other intervertebral disc degeneration, lumbar region with discogenic back pain only: Secondary | ICD-10-CM | POA: Diagnosis not present

## 2024-03-06 DIAGNOSIS — M9901 Segmental and somatic dysfunction of cervical region: Secondary | ICD-10-CM | POA: Diagnosis not present

## 2024-03-06 DIAGNOSIS — M9903 Segmental and somatic dysfunction of lumbar region: Secondary | ICD-10-CM | POA: Diagnosis not present

## 2024-03-30 DIAGNOSIS — Z79899 Other long term (current) drug therapy: Secondary | ICD-10-CM | POA: Diagnosis not present

## 2024-03-30 DIAGNOSIS — Z125 Encounter for screening for malignant neoplasm of prostate: Secondary | ICD-10-CM | POA: Diagnosis not present

## 2024-03-30 DIAGNOSIS — E1151 Type 2 diabetes mellitus with diabetic peripheral angiopathy without gangrene: Secondary | ICD-10-CM | POA: Diagnosis not present

## 2024-04-03 DIAGNOSIS — M9901 Segmental and somatic dysfunction of cervical region: Secondary | ICD-10-CM | POA: Diagnosis not present

## 2024-04-03 DIAGNOSIS — M62830 Muscle spasm of back: Secondary | ICD-10-CM | POA: Diagnosis not present

## 2024-04-03 DIAGNOSIS — M9903 Segmental and somatic dysfunction of lumbar region: Secondary | ICD-10-CM | POA: Diagnosis not present

## 2024-04-03 DIAGNOSIS — M5136 Other intervertebral disc degeneration, lumbar region with discogenic back pain only: Secondary | ICD-10-CM | POA: Diagnosis not present

## 2024-04-06 DIAGNOSIS — E1151 Type 2 diabetes mellitus with diabetic peripheral angiopathy without gangrene: Secondary | ICD-10-CM | POA: Diagnosis not present

## 2024-04-06 DIAGNOSIS — K863 Pseudocyst of pancreas: Secondary | ICD-10-CM | POA: Diagnosis not present

## 2024-04-06 DIAGNOSIS — Z79899 Other long term (current) drug therapy: Secondary | ICD-10-CM | POA: Diagnosis not present

## 2024-04-06 DIAGNOSIS — Z0000 Encounter for general adult medical examination without abnormal findings: Secondary | ICD-10-CM | POA: Diagnosis not present

## 2024-04-13 DIAGNOSIS — E119 Type 2 diabetes mellitus without complications: Secondary | ICD-10-CM | POA: Diagnosis not present

## 2024-04-13 DIAGNOSIS — M7541 Impingement syndrome of right shoulder: Secondary | ICD-10-CM | POA: Diagnosis not present

## 2024-04-16 DIAGNOSIS — K8591 Acute pancreatitis with uninfected necrosis, unspecified: Secondary | ICD-10-CM | POA: Diagnosis not present

## 2024-04-16 DIAGNOSIS — I482 Chronic atrial fibrillation, unspecified: Secondary | ICD-10-CM | POA: Diagnosis not present

## 2024-04-16 DIAGNOSIS — E782 Mixed hyperlipidemia: Secondary | ICD-10-CM | POA: Diagnosis not present

## 2024-04-16 DIAGNOSIS — I2510 Atherosclerotic heart disease of native coronary artery without angina pectoris: Secondary | ICD-10-CM | POA: Diagnosis not present

## 2024-04-16 DIAGNOSIS — E1151 Type 2 diabetes mellitus with diabetic peripheral angiopathy without gangrene: Secondary | ICD-10-CM | POA: Diagnosis not present

## 2024-04-19 DIAGNOSIS — L578 Other skin changes due to chronic exposure to nonionizing radiation: Secondary | ICD-10-CM | POA: Diagnosis not present

## 2024-04-19 DIAGNOSIS — L814 Other melanin hyperpigmentation: Secondary | ICD-10-CM | POA: Diagnosis not present

## 2024-04-19 DIAGNOSIS — C44329 Squamous cell carcinoma of skin of other parts of face: Secondary | ICD-10-CM | POA: Diagnosis not present

## 2024-04-20 DIAGNOSIS — G5793 Unspecified mononeuropathy of bilateral lower limbs: Secondary | ICD-10-CM | POA: Diagnosis not present

## 2024-04-20 DIAGNOSIS — L603 Nail dystrophy: Secondary | ICD-10-CM | POA: Diagnosis not present

## 2024-04-20 DIAGNOSIS — L600 Ingrowing nail: Secondary | ICD-10-CM | POA: Diagnosis not present

## 2024-05-01 DIAGNOSIS — M9903 Segmental and somatic dysfunction of lumbar region: Secondary | ICD-10-CM | POA: Diagnosis not present

## 2024-05-01 DIAGNOSIS — M62830 Muscle spasm of back: Secondary | ICD-10-CM | POA: Diagnosis not present

## 2024-05-01 DIAGNOSIS — M9901 Segmental and somatic dysfunction of cervical region: Secondary | ICD-10-CM | POA: Diagnosis not present

## 2024-05-01 DIAGNOSIS — M5136 Other intervertebral disc degeneration, lumbar region with discogenic back pain only: Secondary | ICD-10-CM | POA: Diagnosis not present

## 2024-05-29 DIAGNOSIS — M62830 Muscle spasm of back: Secondary | ICD-10-CM | POA: Diagnosis not present

## 2024-05-29 DIAGNOSIS — M9903 Segmental and somatic dysfunction of lumbar region: Secondary | ICD-10-CM | POA: Diagnosis not present

## 2024-05-29 DIAGNOSIS — M9901 Segmental and somatic dysfunction of cervical region: Secondary | ICD-10-CM | POA: Diagnosis not present

## 2024-05-29 DIAGNOSIS — M5136 Other intervertebral disc degeneration, lumbar region with discogenic back pain only: Secondary | ICD-10-CM | POA: Diagnosis not present
